# Patient Record
Sex: Male | Born: 1945 | Race: Black or African American | Hispanic: No | Marital: Married | Smoking: Former smoker
Health system: Southern US, Community
[De-identification: ages and names within clinical notes are randomized; demographics above are authoritative.]

## PROBLEM LIST (undated history)

## (undated) DIAGNOSIS — I639 Cerebral infarction, unspecified: Secondary | ICD-10-CM

## (undated) DIAGNOSIS — J309 Allergic rhinitis, unspecified: Secondary | ICD-10-CM

## (undated) DIAGNOSIS — I10 Essential (primary) hypertension: Secondary | ICD-10-CM

## (undated) DIAGNOSIS — I319 Disease of pericardium, unspecified: Secondary | ICD-10-CM

## (undated) DIAGNOSIS — E0590 Thyrotoxicosis, unspecified without thyrotoxic crisis or storm: Secondary | ICD-10-CM

## (undated) DIAGNOSIS — L80 Vitiligo: Secondary | ICD-10-CM

## (undated) DIAGNOSIS — G4733 Obstructive sleep apnea (adult) (pediatric): Secondary | ICD-10-CM

## (undated) DIAGNOSIS — M545 Low back pain, unspecified: Secondary | ICD-10-CM

## (undated) DIAGNOSIS — E7800 Pure hypercholesterolemia, unspecified: Secondary | ICD-10-CM

## (undated) DIAGNOSIS — R7302 Impaired glucose tolerance (oral): Secondary | ICD-10-CM

## (undated) DIAGNOSIS — D472 Monoclonal gammopathy: Secondary | ICD-10-CM

## (undated) DIAGNOSIS — M1990 Unspecified osteoarthritis, unspecified site: Secondary | ICD-10-CM

## (undated) DIAGNOSIS — Z9289 Personal history of other medical treatment: Secondary | ICD-10-CM

## (undated) DIAGNOSIS — Z9989 Dependence on other enabling machines and devices: Secondary | ICD-10-CM

## (undated) DIAGNOSIS — E669 Obesity, unspecified: Secondary | ICD-10-CM

## (undated) DIAGNOSIS — G8929 Other chronic pain: Secondary | ICD-10-CM

## (undated) HISTORY — PX: LUMBAR LAMINECTOMY: SHX95

## (undated) HISTORY — PX: UMBILICAL HERNIA REPAIR: SHX196

## (undated) HISTORY — DX: Allergic rhinitis, unspecified: J30.9

## (undated) HISTORY — DX: Disease of pericardium, unspecified: I31.9

## (undated) HISTORY — DX: Thyrotoxicosis, unspecified without thyrotoxic crisis or storm: E05.90

## (undated) HISTORY — DX: Essential (primary) hypertension: I10

## (undated) HISTORY — PX: INGUINAL HERNIA REPAIR: SUR1180

## (undated) HISTORY — DX: Vitiligo: L80

## (undated) HISTORY — DX: Personal history of other medical treatment: Z92.89

## (undated) HISTORY — DX: Obesity, unspecified: E66.9

---

## 1979-07-08 DIAGNOSIS — I319 Disease of pericardium, unspecified: Secondary | ICD-10-CM

## 1979-07-08 HISTORY — DX: Disease of pericardium, unspecified: I31.9

## 2010-01-25 ENCOUNTER — Ambulatory Visit: Payer: Self-pay | Admitting: Internal Medicine

## 2010-01-25 DIAGNOSIS — J309 Allergic rhinitis, unspecified: Secondary | ICD-10-CM

## 2010-01-25 DIAGNOSIS — I10 Essential (primary) hypertension: Secondary | ICD-10-CM

## 2010-01-25 DIAGNOSIS — L0292 Furuncle, unspecified: Secondary | ICD-10-CM | POA: Insufficient documentation

## 2010-01-25 DIAGNOSIS — L0293 Carbuncle, unspecified: Secondary | ICD-10-CM

## 2010-01-25 DIAGNOSIS — E0590 Thyrotoxicosis, unspecified without thyrotoxic crisis or storm: Secondary | ICD-10-CM

## 2010-01-25 HISTORY — DX: Thyrotoxicosis, unspecified without thyrotoxic crisis or storm: E05.90

## 2010-01-25 HISTORY — DX: Allergic rhinitis, unspecified: J30.9

## 2010-01-25 HISTORY — DX: Essential (primary) hypertension: I10

## 2010-03-03 ENCOUNTER — Ambulatory Visit: Payer: Self-pay | Admitting: Internal Medicine

## 2010-03-03 ENCOUNTER — Telehealth: Payer: Self-pay | Admitting: Internal Medicine

## 2010-03-03 DIAGNOSIS — M542 Cervicalgia: Secondary | ICD-10-CM | POA: Insufficient documentation

## 2010-03-07 ENCOUNTER — Telehealth: Payer: Self-pay | Admitting: Internal Medicine

## 2010-03-08 ENCOUNTER — Ambulatory Visit: Payer: Self-pay | Admitting: Internal Medicine

## 2010-03-08 DIAGNOSIS — K047 Periapical abscess without sinus: Secondary | ICD-10-CM | POA: Insufficient documentation

## 2010-04-25 ENCOUNTER — Telehealth: Payer: Self-pay | Admitting: Internal Medicine

## 2010-06-10 ENCOUNTER — Telehealth: Payer: Self-pay | Admitting: Internal Medicine

## 2010-12-06 NOTE — Progress Notes (Signed)
Summary: [facility]  03/08/2010  Phone Note Call from Patient   Caller: Patient Call For: Gordy Savers  MD Summary of Call: Pt left message to have Dr Kirtland Bouchard call him ASAP. Called back and left message on voice mail to call us back. [phone] Initial call taken by: Lynann Beaver CMA,  Mar 07, 2010 10:45 AM  Follow-up for Phone Call        In the office on Thursday.  Celebrex is helping with the pain, but he is taking 4 DAILY.  Cannot hear well from ear, but has no insurance.  Thinks it it an infected tooth.  Is going to try and see a dentist and call us back.  Follow-up by: Lynann Beaver CMA,  Mar 07, 2010 11:22 AM  Additional Follow-up for Phone Call Additional follow up Details #1::        ask patient to keep [place] posted and to take max dose of celebress of 2 daily; may take tylenol- dentist may Rx antibiotic and/or stronger pain med Additional Follow-up by: Gordy Savers  MD,  Mar 07, 2010 12:42 PM    Additional Follow-up for Phone Call Additional follow up Details #2::    LMTCB Follow-up by: Lynann Beaver CMA,  Mar 08, 2010 11:29 AM  Additional Follow-up for Phone Call Additional follow up Details #3:: Details for Additional Follow-up Action Taken: Pt. saw Dr. Kirtland Bouchard today. Additional Follow-up by: Lynann Beaver CMA,  Mar 08, 2010 11:34 AM

## 2010-12-06 NOTE — Assessment & Plan Note (Signed)
Summary: swollen/infected lymph gland??/dm   Vital Signs:  Patient profile:   65 year old male Weight:      259 pounds Temp:     97.6 degrees F oral BP sitting:   122 / 80  (right arm) Cuff size:   large  Vitals Entered By: Duard Brady LPN (March 03, 2010 2:48 PM) CC: rt neck gland swollen & painful   nasal congestion   CC:  rt neck gland swollen & painful   nasal congestion.  History of Present Illness: 65 year old patient who has a history of hypertension.  A possible one week ago.  He fell to the soft tissue infection involving the dorsal aspect of his right hand.  He felt it may have been bitten by an insect.  He took Cipro for one week, which he completed earlier today.  Three days ago he developed pain in the right anterior neck.  The hand lesion has largely resolved.  There is been no fever or other constitutional complaints.  Preventive Screening-Counseling & Management  Alcohol-Tobacco     Smoking Status: never  Allergies (verified): No Known Drug Allergies  Past History:  Past Medical History: Reviewed history from 01/25/2010 and no changes required. Hypertension Hyperthyroidism history of Graves' disease, status post I-131 1980s vitiligo obesity history of borderline glaucoma rule out obstructive sleep apnea pericarditis 1980s Allergic rhinitis  Past Surgical History: Reviewed history from 01/25/2010 and no changes required. Inguinal herniorrhaphy Lumbar laminectomy status post umbilical hernia repair colonoscopy 2009  Social History: Smoking Status:  never  Physical Exam  General:  overweight-appearing.  normal blood pressure Head:  Normocephalic and atraumatic without obvious abnormalities. No apparent alopecia or balding. Eyes:  No corneal or conjunctival inflammation noted. EOMI. Perrla. Funduscopic exam benign, without hemorrhages, exudates or papilledema. Vision grossly normal. Ears:  External ear exam shows no significant lesions or  deformities.  Otoscopic examination reveals clear canals, tympanic membranes are intact bilaterally without bulging, retraction, inflammation or discharge. Hearing is grossly normal bilaterally. Mouth:  Oral mucosa and oropharynx without lesions or exudates.  Teeth in good repair. Neck:  there is tenderness to deep palpation in the right anterior cervical area.  There is no definite adenopathy Lungs:  Normal respiratory effort, chest expands symmetrically. Lungs are clear to auscultation, no crackles or wheezes. Heart:  Normal rate and regular rhythm. S1 and S2 normal without gallop, murmur, click, rub or other extra sounds. Cervical Nodes:  No lymphadenopathy noted Axillary Nodes:  No palpable lymphadenopathy no right epitrochlear nodes   Impression & Recommendations:  Problem # 1:  NECK PAIN, RIGHT (ICD-723.1) etiology is unclear.  Does not appear to have obvious lymphadenitis.  ENT exam is unremarkable.  He has just  completed 7 days of Cipro therapy.  Will place on Celebrex 400 mg daily, and clinically observe  Problem # 2:  HYPERTENSION (ICD-401.9)  His updated medication list for this problem includes:    Metoprolol Succinate 50 Mg Xr24h-tab (Metoprolol succinate) ..... One daily    Lisinopril 20 Mg Tabs (Lisinopril) ..... One daily  Complete Medication List: 1)  Metoprolol Succinate 50 Mg Xr24h-tab (Metoprolol succinate) .... One daily 2)  Lisinopril 20 Mg Tabs (Lisinopril) .... One daily 3)  Cephalexin 500 Mg Caps (Cephalexin) .... One capsule 3 times daily 4)  Cialis 20 Mg Tabs (Tadalafil) .... As directed  Patient Instructions: 1)  Celebrex 200 mg twice daily 2)  return next week for follow-up if pain is persistent 3)  call for any new symptoms

## 2010-12-06 NOTE — Assessment & Plan Note (Signed)
Summary: TO BE EST/LESIONS ON BUTTOCK/NJR   Vital Signs:  Patient profile:   65 year old male Height:      71.75 inches Weight:      255 pounds BMI:     34.95 Temp:     98.0 degrees F oral BP sitting:   138 / 90  (right arm) Cuff size:   large CC: new pt to establish - no insurance right now - has relocated from [place] , not working just yet Is Patient Diabetic? No   CC:  new pt to establish - no insurance right now - has relocated from [place]  and not working just yet.  History of Present Illness: 5 -year-old patient who is seen today  to establish what our practicegood and he has an approximate 10 year history of treated hypertension.  His main complaint today are skin abscess is involving the buttock region.  Medical problems include the hypertension, history of Graves' disease, history of pericarditis in the 89s and also a history of seasonal allergic rhinitis. He is a heavy snorer and his wife has concerns about possible obstructive sleep apnea.  He really denies any daytime sleepiness.  Preventive Screening-Counseling & Management  Alcohol-Tobacco     Smoking Status: quit  Allergies (verified): No Known Drug Allergies  Past History:  Past Medical History: Hypertension Hyperthyroidism history of Graves' disease, status post I-131 1980s vitiligo obesity history of borderline glaucoma rule out obstructive sleep apnea pericarditis 1980s Allergic rhinitis  Past Surgical History: Inguinal herniorrhaphy Lumbar laminectomy status post umbilical hernia repair colonoscopy 2009  Family History: Reviewed history and no changes required. father died at 24.  complications of cerebral vascular  disease mother age 1, lives independently two brothers, one died complications.  No cancer one sister, history of thyroid disease, and goiter  Social History: Reviewed history and no changes required. Married Airline pilot presently  unemployed two daughters, one with a history of breast  cancerSmoking Status:  quit  Review of Systems       The patient complains of suspicious skin lesions.  The patient denies anorexia, fever, weight loss, weight gain, vision loss, decreased hearing, hoarseness, chest pain, syncope, dyspnea on exertion, peripheral edema, prolonged cough, headaches, hemoptysis, abdominal pain, melena, hematochezia, severe indigestion/heartburn, hematuria, incontinence, genital sores, muscle weakness, transient blindness, difficulty walking, depression, unusual weight change, abnormal bleeding, enlarged lymph nodes, angioedema, breast masses, and testicular masses.    Physical Exam  General:  overweight-appearing.  vitiligo;  blood pressure 140/90 Head:  Normocephalic and atraumatic without obvious abnormalities. No apparent alopecia or balding. Eyes:  No corneal or conjunctival inflammation noted. EOMI. Perrla. Funduscopic exam benign, without hemorrhages, exudates or papilledema. Vision grossly normal. Ears:  External ear exam shows no significant lesions or deformities.  Otoscopic examination reveals clear canals, tympanic membranes are intact bilaterally without bulging, retraction, inflammation or discharge. Hearing is grossly normal bilaterally. Nose:  External nasal examination shows no deformity or inflammation. Nasal mucosa are pink and moist without lesions or exudates. Mouth:  Oral mucosa and oropharynx without lesions or exudates.  Teeth in good repair.pharyngeal crowding.   Neck:  No deformities, masses, or tenderness noted. Chest Wall:  No deformities, masses, tenderness or gynecomastia noted. Breasts:  No masses or gynecomastia noted Lungs:  Normal respiratory effort, chest expands symmetrically. Lungs are clear to auscultation, no crackles or wheezes. Heart:  Normal rate and regular rhythm. S1 and S2 normal without gallop, murmur, click, rub or other extra sounds. Abdomen:  Bowel sounds positive,abdomen soft  and non-tender without masses, organomegaly  or hernias noted. Rectal:  No external abnormalities noted. Normal sphincter tone. No rectal masses or tenderness. Genitalia:  Testes bilaterally descended without nodularity, tenderness or masses. No scrotal masses or lesions. No penis lesions or urethral discharge. Prostate:  1+ enlarged.   Msk:  No deformity or scoliosis noted of thoracic or lumbar spine.   Pulses:  R and L carotid,radial,femoral,dorsalis pedis and posterior tibial pulses are full and equal bilaterally Extremities:  No clubbing, cyanosis, edema, or deformity noted with normal full range of motion of all joints.   Neurologic:  No cranial nerve deficits noted. Station and gait are normal. Plantar reflexes are down-going bilaterally. DTRs are symmetrical throughout. Sensory, motor and coordinative functions appear intact. Skin:  scattered small inflammatory papules over the buttock area Cervical Nodes:  No lymphadenopathy noted Axillary Nodes:  No palpable lymphadenopathy Inguinal Nodes:  No significant adenopathy Psych:  Cognition and judgment appear intact. Alert and cooperative with normal attention span and concentration. 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 One daily    Lisinopril 20 Mg Tabs (Lisinopril) ..... One daily  Problem # 3:  ALLERGIC RHINITIS (ICD-477.9)  Complete Medication List: 1)  Metoprolol Succinate 50 Mg Xr24h-tab (Metoprolol succinate) .... One daily 2)  Lisinopril 20 Mg Tabs (Lisinopril) .... One daily 3)  Cephalexin 500 Mg Caps (Cephalexin) .... One capsule 3 times daily 4)  Cialis 20 Mg Tabs (Tadalafil) .... As directed  Other Orders: EKG w/ Interpretation (93000)  Patient Instructions: 1)  Please schedule a follow-up appointment in 6  months. 2)  Limit your Sodium (Salt). 3)  It is important that you exercise regularly at least 20 minutes 5 times a week. If you develop chest pain, have severe difficulty breathing, or feel very tired , stop exercising immediately and seek medical attention. 4)  You need to lose weight. Consider a lower calorie diet and regular exercise.  5)  Check your Blood Pressure regularly. If it is above:  150/90 you should make an appointment. 6)  Take your antibiotic as prescribed until ALL of it is gone, but stop if you develop a rash or swelling and contact our office as soon as possible. Prescriptions: CIALIS 20 MG TABS (TADALAFIL) as directed  #6 x 12   Entered and Authorized by:   Gordy Savers  MD   Signed by:   Gordy Savers  MD on 01/25/2010   Method used:   Print then Give to Patient   RxID:   [id number] CEPHALEXIN 500 MG CAPS (CEPHALEXIN) one capsule 3 times daily  #30 x 0   Entered and Authorized by:   Gordy Savers  MD   Signed by:   Gordy Savers  MD on 01/25/2010   Method used:   Print then Give to Patient   RxID:   [id number] LISINOPRIL 20 MG TABS (LISINOPRIL) one daily  #90 x 4   Entered and Authorized by:   Gordy Savers  MD   Signed by:   Gordy Savers  MD on 01/25/2010   Method used:   Print then Give to Patient   RxID:   [id number] METOPROLOL SUCCINATE 50 MG XR24H-TAB (METOPROLOL SUCCINATE) one daily  #90 x 0   Entered and Authorized by:  Gordy Savers  MD   Signed by:   Gordy Savers  MD on 01/25/2010   Method used:   Print then Give to Patient   RxID:   [id number]

## 2010-12-06 NOTE — Progress Notes (Signed)
Summary: sinus sx - muccinex d   Phone Note Call from Patient   Caller: spouse- Dianne Summary of Call: Pt has sinus and upper respiratory inf. Pt req med called in to [facility]. [phone]. Pt says that they cant afford copay for an ov.  Initial call taken by: Lucy Antigua,  March 03, 2010 9:15 AM  Follow-up for Phone Call        suggest mucinex D  use BID Follow-up by: Gordy Savers  MD,  March 03, 2010 12:35 PM  Additional Follow-up for Phone Call Additional follow up Details #1::        spoke with wife - instructed to use muccinex d OTC two times a day - and make sure to hydrate well or med will not be helpful.  If no improvment 2-3 days or fever - to see.  KIK Additional Follow-up by: Duard Brady LPN,  March 03, 2010 12:59 PM     Appended Document: sinus sx - muccinex d  Pt calls back stating that he has developed an extremely painful swollen cervical gland???  Concerned about infection, and if he needs more than Mucinex.  Will come in to let Dr. Kirtland Bouchard evaluate.

## 2010-12-06 NOTE — Progress Notes (Signed)
Summary: refill lisnopril and metoprol.  Phone Note Refill Request Message from:  Fax from Pharmacy on June 10, 2010 11:45 AM  Refills Requested: Medication #1:  METOPROLOL SUCCINATE 50 MG XR24H-TAB one daily  Medication #2:  LISINOPRIL 20 MG TABS one daily [facility]   Method Requested: Fax to Local Pharmacy Initial call taken by: Duard Brady LPN,  June 10, 2010 11:46 AM    Prescriptions: LISINOPRIL 20 MG TABS (LISINOPRIL) one daily  #90 x 4   Entered by:   Duard Brady LPN   Authorized by:   Gordy Savers  MD   Signed by:   Duard Brady LPN on 91/47/8295   Method used:   Faxed to ...       [facility] (mail-order)             , [place]         Ph: [phone]       Fax: [phone]   RxID:   [id number] METOPROLOL SUCCINATE 50 MG XR24H-TAB (METOPROLOL SUCCINATE) one daily  #90 x 4   Entered by:   Duard Brady LPN   Authorized by:   Gordy Savers  MD   Signed by:   Duard Brady LPN on 40/34/7425   Method used:   Faxed to ...       [facility] (mail-order)             , [place]         Ph: [phone]       Fax: [phone]   RxID:   [id number]

## 2010-12-06 NOTE — Assessment & Plan Note (Signed)
Summary: ear pain//ccm   Vital Signs:  Patient profile:   65 year old male Weight:      261 pounds Temp:     98.1 degrees F oral BP sitting:   130 / 80  (right arm) Cuff size:   large  Vitals Entered By: Duard Brady LPN (Mar 08, 5620 8:09 AM) CC: c/o (R) ear pain, cough Is Patient Diabetic? No   CC:  c/o (R) ear pain and cough.  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  He also has a cracked tooth and dental pain in the left upper region.  He has treated hypertension.  Denies any fever or chills.  He states his pain is much improved compared to yesterday.  He is on Celebrex.  Denies any drug allergies.  Allergies (verified): No Known Drug Allergies  Past History:  Past Medical History: Reviewed history from 01/25/2010 and no changes required. Hypertension Hyperthyroidism history of Graves' disease, status post I-131 1980s vitiligo obesity history of borderline glaucoma rule out obstructive sleep apnea pericarditis 1980s Allergic rhinitis  Physical Exam  General:  overweight-appearing.  130/80overweight-appearing.   Head:  Normocephalic and atraumatic without obvious abnormalities. No apparent alopecia or balding. Eyes:  No corneal or conjunctival inflammation noted. EOMI. Perrla. Funduscopic exam benign, without hemorrhages, exudates or papilledema. Vision grossly normal. Ears:  External ear exam shows no significant lesions or deformities.  Otoscopic examination reveals clear canals, tympanic membranes are intact bilaterally without bulging, retraction, inflammation or discharge. Hearing is grossly normal bilaterally. Mouth:  Oral mucosa and oropharynx without lesions or exudates.  a cracked tooth was present involving the right upper region Neck:  there was tenderness and mild soft tissue swelling about the right facial and mandibular area.  Mild tender right anterior cervical  adenopathy noted Lungs:  Normal respiratory effort, chest expands symmetrically. Lungs are clear to auscultation, no crackles or wheezes. Heart:  Normal rate and regular rhythm. S1 and S2 normal without gallop, murmur, click, rub or other extra sounds.   Impression & Recommendations:  Problem # 1:  ABSCESS, TOOTH (ICD-522.5)  Problem # 2:  NECK PAIN, RIGHT (ICD-723.1)  Problem # 3:  HYPERTENSION (ICD-401.9)  His updated medication list for this problem includes:    Metoprolol Succinate 50 Mg Xr24h-tab (Metoprolol succinate) ..... One daily    Lisinopril 20 Mg Tabs (Lisinopril) ..... One daily  His updated medication list for this problem includes:    Metoprolol Succinate 50 Mg Xr24h-tab (Metoprolol succinate) ..... One daily    Lisinopril 20 Mg Tabs (Lisinopril) ..... One daily  Complete Medication List: 1)  Metoprolol Succinate 50 Mg Xr24h-tab (Metoprolol succinate) .... One daily 2)  Lisinopril 20 Mg Tabs (Lisinopril) .... One daily 3)  Cialis 20 Mg Tabs (Tadalafil) .... As directed 4)  Amoxicillin-pot Clavulanate 875-125 Mg Tabs (Amoxicillin-pot clavulanate) .... One twice daily  Patient Instructions: 1)  follow up with your dentist 2)  Limit your Sodium (Salt) to less than 2 grams a day(slightly less than 1/2 a teaspoon) to prevent fluid retention, swelling, or worsening of symptoms. 3)  Take your antibiotic as prescribed until ALL of it is gone, but stop if you develop a rash or swelling and contact our office as soon as possible. 4)  Please schedule a follow-up appointment in 6 months. Prescriptions: AMOXICILLIN-POT CLAVULANATE 875-125 MG TABS (AMOXICILLIN-POT CLAVULANATE) one twice daily  #20 x 0   Entered and Authorized by:  Gordy Savers  MD   Signed by:   Gordy Savers  MD on 03/08/2010   Method used:   Print then Give to Patient   RxID:   [id number]

## 2010-12-06 NOTE — Progress Notes (Signed)
Summary: EKG?  Phone Note Call from Patient Call back at Home Phone [phone]   Caller: Spouse Summary of Call: Pt states he was advised by physician he would not be charged for ekg done on 01/25/10.  Please advise.  Initial call taken by: Trixie Dredge,  March 03, 2010 8:45 AM  Follow-up for Phone Call        ok to cancel charge Follow-up by: Gordy Savers  MD,  March 03, 2010 8:52 AM

## 2010-12-06 NOTE — Progress Notes (Signed)
Summary: Pt req script for Metoprolol and  Lisinopril to [facility]  Phone Note Call from Patient Call back at Home Phone [phone]   Caller: spouse- Dianne Summary of Call: Pt needs refills of Metoprolol 50mg  and Lisinopril 20mg  to [facility].  Initial call taken by: Lucy Antigua,  April 25, 2010 4:00 PM    Prescriptions: LISINOPRIL 20 MG TABS (LISINOPRIL) one daily  #90 x 0   Entered by:   Kern Reap CMA (AAMA)   Authorized by:   Gordy Savers  MD   Signed by:   Kern Reap CMA (AAMA) on 04/25/2010   Method used:   Faxed to ...       [facility] (mail-order)             , [place]         Ph: [phone]       Fax: [phone]   RxID:   [id number] METOPROLOL SUCCINATE 50 MG XR24H-TAB (METOPROLOL SUCCINATE) one daily  #90 x 0   Entered by:   Kern Reap CMA (AAMA)   Authorized by:   Gordy Savers  MD   Signed by:   Kern Reap CMA (AAMA) on 04/25/2010   Method used:   Faxed to ...       [facility] (mail-[facility])             , [place]         Ph: [phone]       Fax: [phone]   RxID:   [id number]

## 2010-12-08 ENCOUNTER — Encounter: Payer: Self-pay | Admitting: Internal Medicine

## 2010-12-09 ENCOUNTER — Ambulatory Visit: Payer: Self-pay | Admitting: Internal Medicine

## 2010-12-09 ENCOUNTER — Encounter: Payer: Self-pay | Admitting: Internal Medicine

## 2010-12-09 ENCOUNTER — Ambulatory Visit (INDEPENDENT_AMBULATORY_CARE_PROVIDER_SITE_OTHER): Payer: PRIVATE HEALTH INSURANCE | Admitting: Internal Medicine

## 2010-12-09 VITALS — BP 118/80 | Temp 98.2°F | Ht 72.0 in | Wt 255.0 lb

## 2010-12-09 DIAGNOSIS — N529 Male erectile dysfunction, unspecified: Secondary | ICD-10-CM

## 2010-12-09 DIAGNOSIS — J069 Acute upper respiratory infection, unspecified: Secondary | ICD-10-CM

## 2010-12-09 DIAGNOSIS — I10 Essential (primary) hypertension: Secondary | ICD-10-CM

## 2010-12-09 MED ORDER — HYDROCODONE-HOMATROPINE 5-1.5 MG/5ML PO SYRP: 5.0000 mL | ORAL_SOLUTION | Freq: Four times a day (QID) | ORAL | Status: AC | PRN

## 2010-12-09 NOTE — Progress Notes (Signed)
  Subjective:    Patient ID: Gordon Phillips, male    DOB: March 06, 1946, 65 y.o.   MRN: [id number]  HPI  65 year old patient who has a history of hypertension, as well as allergic rhinitis, who presents with a one to two week history of cough and congestion.  He has also had some intermittent mild sore throat and also some tenderness in the left anterior neck area.  The cough has improved.  His main complaint now is pain in the left lower neck region.  He has only mild sore throat.  He has been using penicillin obtained from his wife periodically over the past week.  He does have a history of OSA using CPAP.    Review of Systems  Constitutional: Negative for fever, chills, appetite change and fatigue.  HENT: Positive for neck pain. Negative for hearing loss, ear pain, congestion, sore throat, trouble swallowing, neck stiffness, dental problem, voice change and tinnitus.   Eyes: Negative for pain, discharge and visual disturbance.  Respiratory: Positive for cough. Negative for chest tightness, wheezing and stridor.   Cardiovascular: Negative for chest pain, palpitations and leg swelling.  Gastrointestinal: Negative for nausea, vomiting, abdominal pain, diarrhea, constipation, blood in stool and abdominal distention.  Genitourinary: Negative for urgency, hematuria, flank pain, discharge, difficulty urinating and genital sores.  Musculoskeletal: Negative for myalgias, back pain, joint swelling, arthralgias and gait problem.  Skin: Negative for rash.  Neurological: Negative for dizziness, syncope, speech difficulty, weakness, numbness and headaches.  Hematological: Negative for adenopathy. Does not bruise/bleed easily.  Psychiatric/Behavioral: Negative for behavioral problems and dysphoric mood. The patient is not nervous/anxious.        Objective:   Physical Exam  Constitutional: He is oriented to person, place, and time. He appears well-developed and well-nourished.       Overweight no distress    HENT:  Head: Normocephalic.  Right Ear: External ear normal.  Left Ear: External ear normal.  Eyes: Conjunctivae and EOM are normal. Pupils are equal, round, and reactive to light.  Neck: Normal range of motion. Neck supple.       No definite adenopathy appreciated.  There was some tenderness involving the left lower anterior neck region just anterior to the lower border of the sternocleidomastoid muscle; careful palpation in the neck and supraclavicular area revealed no adenopathy  Cardiovascular: Normal rate, normal heart sounds and intact distal pulses.   Pulmonary/Chest: Breath sounds normal. No respiratory distress. He has no wheezes. He has no rales. He exhibits no tenderness.  Abdominal: Bowel sounds are normal. There is no tenderness.  Musculoskeletal: Normal range of motion. He exhibits no edema and no tenderness.  Lymphadenopathy:    He has no cervical adenopathy.  Neurological: He is alert and oriented to person, place, and time.  Psychiatric: He has a normal mood and affect. His behavior is normal.          Assessment & Plan:  1.  URI will treat symptomatically and re-examine if the left neck discomfort does not improve. 2.  He has concerns about low testosterone level.  Will recheck next week In the early a.m. 3.  Hypertension stable

## 2010-12-09 NOTE — Patient Instructions (Signed)
Limit your sodium (Salt) intake   It is important that you exercise regularly, at least 20 minutes 3 to 4 times per week.  If you develop chest pain or shortness of breath seek  medical attention.  Please check your blood pressure on a regular basis.  If it is consistently greater than 150/90, please make an office appointment.  Returned on Monday morning for the testosterone test  Get plenty of rest, Drink lots of  clear liquids, and use Tylenol or ibuprofen for fever and discomfort.

## 2010-12-12 ENCOUNTER — Other Ambulatory Visit (INDEPENDENT_AMBULATORY_CARE_PROVIDER_SITE_OTHER): Payer: PRIVATE HEALTH INSURANCE | Admitting: Internal Medicine

## 2010-12-12 DIAGNOSIS — E291 Testicular hypofunction: Secondary | ICD-10-CM

## 2010-12-12 DIAGNOSIS — N529 Male erectile dysfunction, unspecified: Secondary | ICD-10-CM

## 2010-12-12 DIAGNOSIS — E349 Endocrine disorder, unspecified: Secondary | ICD-10-CM

## 2010-12-12 LAB — TESTOSTERONE: Testosterone: 336.92 ng/dL — ABNORMAL LOW (ref 350.00–890.00)

## 2010-12-13 ENCOUNTER — Telehealth: Payer: Self-pay

## 2010-12-13 NOTE — Telephone Encounter (Signed)
Attempt to call- ans mach - LMTCB - informed low normal - would he like rx for testosterone?

## 2010-12-13 NOTE — Telephone Encounter (Signed)
Message copied by Duard Brady on Tue Dec 13, 2010  5:01 PM ------      Message from: Eleonore Chiquito      Created: Mon Dec 12, 2010  5:37 PM       Notify  patient that his level was borderline low and ask whether he would like a prescription for testosterone called in

## 2010-12-14 ENCOUNTER — Telehealth: Payer: Self-pay | Admitting: Internal Medicine

## 2010-12-14 NOTE — Telephone Encounter (Signed)
Pt request call from Dr. Amador Cunas if possible - has question r/t possible starting testosterone med since results was boredline low. Concerned about side effects and is this a med that he could stop in the future.

## 2010-12-14 NOTE — Telephone Encounter (Signed)
Pt is return kim call concerning bloodwork results

## 2010-12-15 NOTE — Telephone Encounter (Signed)
Called and left  Message  on his home after machine.  Unable to reach on his mobile.  Number

## 2010-12-16 NOTE — Telephone Encounter (Signed)
Doctor in separate encounter - attempted to call. KIK

## 2011-05-29 ENCOUNTER — Encounter: Payer: Self-pay | Admitting: Internal Medicine

## 2011-05-29 ENCOUNTER — Ambulatory Visit (INDEPENDENT_AMBULATORY_CARE_PROVIDER_SITE_OTHER): Payer: PRIVATE HEALTH INSURANCE | Admitting: Internal Medicine

## 2011-05-29 VITALS — BP 130/80 | Temp 98.1°F | Wt 262.0 lb

## 2011-05-29 DIAGNOSIS — R7309 Other abnormal glucose: Secondary | ICD-10-CM

## 2011-05-29 DIAGNOSIS — R7302 Impaired glucose tolerance (oral): Secondary | ICD-10-CM

## 2011-05-29 DIAGNOSIS — I10 Essential (primary) hypertension: Secondary | ICD-10-CM

## 2011-05-29 LAB — GLUCOSE, POCT (MANUAL RESULT ENTRY): POC Glucose: 107

## 2011-05-29 MED ORDER — SILDENAFIL CITRATE 100 MG PO TABS: 100.0000 mg | ORAL_TABLET | ORAL | Status: DC | PRN

## 2011-05-29 MED ORDER — METOPROLOL SUCCINATE ER 50 MG PO TB24: 50.0000 mg | ORAL_TABLET | Freq: Every day | ORAL | Status: DC

## 2011-05-29 MED ORDER — LISINOPRIL 20 MG PO TABS: 20.0000 mg | ORAL_TABLET | Freq: Every day | ORAL | Status: DC

## 2011-05-29 NOTE — Patient Instructions (Signed)
Limit your sodium (Salt) intake    It is important that you exercise regularly, at least 20 minutes 3 to 4 times per week.  If you develop chest pain or shortness of breath seek  medical attention.  You need to lose weight.  Consider a lower calorie diet and regular exercise.  Return in 6 months for follow-up   

## 2011-05-29 NOTE — Progress Notes (Signed)
  Subjective:    Patient ID: Gordon Phillips, male    DOB: 1946-09-11, 65 y.o.   MRN: [id number]  HPI 65 year old patient who is seen today for followup of his hypertension. He had some itching involving the upper back earlier that has resolved. He remains on Toprol and Zestril for blood pressure control. She requires nocturnal CPAP for obstructive sleep apnea. His only complaint is decreased urinary flow most marked in the morning   Review of Systems  Constitutional: Negative for fever, chills, appetite change and fatigue.  HENT: Negative for hearing loss, ear pain, congestion, sore throat, trouble swallowing, neck stiffness, dental problem, voice change and tinnitus.   Eyes: Negative for pain, discharge and visual disturbance.  Respiratory: Negative for cough, chest tightness, wheezing and stridor.   Cardiovascular: Negative for chest pain, palpitations and leg swelling.  Gastrointestinal: Negative for nausea, vomiting, abdominal pain, diarrhea, constipation, blood in stool and abdominal distention.  Genitourinary: Positive for urgency and difficulty urinating. Negative for hematuria, flank pain, discharge and genital sores.  Musculoskeletal: Negative for myalgias, back pain, joint swelling, arthralgias and gait problem.  Skin: Positive for rash.       Followed by dermatology for vitiligo which has been improved with topical medication  Neurological: Negative for dizziness, syncope, speech difficulty, weakness, numbness and headaches.  Hematological: Negative for adenopathy. Does not bruise/bleed easily.  Psychiatric/Behavioral: Negative for behavioral problems and dysphoric mood. The patient is not nervous/anxious.        Objective:   Physical Exam  Constitutional: He is oriented to person, place, and time. He appears well-developed.  HENT:  Head: Normocephalic.  Right Ear: External ear normal.  Left Ear: External ear normal.  Eyes: Conjunctivae and EOM are normal.  Neck: Normal range  of motion.  Cardiovascular: Normal rate and normal heart sounds.   Pulmonary/Chest: Breath sounds normal.  Abdominal: Bowel sounds are normal.  Musculoskeletal: Normal range of motion. He exhibits no edema and no tenderness.  Neurological: He is alert and oriented to person, place, and time.  Skin:       Marked scalp vitiligo. Also patchy areas over the arms  Psychiatric: He has a normal mood and affect. His behavior is normal.          Assessment & Plan:   Hypertension well controlled  We'll continue his present regimen. Refills provided. We'll schedule for an annual exam in 6 months

## 2011-07-11 ENCOUNTER — Other Ambulatory Visit: Payer: Self-pay | Admitting: Internal Medicine

## 2011-07-11 MED ORDER — METOPROLOL SUCCINATE ER 50 MG PO TB24: 50.0000 mg | ORAL_TABLET | Freq: Every day | ORAL | Status: DC

## 2011-07-11 MED ORDER — LISINOPRIL 20 MG PO TABS: 20.0000 mg | ORAL_TABLET | Freq: Every day | ORAL | Status: DC

## 2011-07-11 NOTE — Telephone Encounter (Signed)
efiled to [employer]

## 2011-07-11 NOTE — Telephone Encounter (Signed)
Pt req refill of lisinopril (PRINIVIL,ZESTRIL) 20 MG tablet and metoprolol (TOPROL-XL) 50 MG 24 hr tablet to [facility] order pharmacy.

## 2011-08-10 ENCOUNTER — Ambulatory Visit: Payer: PRIVATE HEALTH INSURANCE | Admitting: Internal Medicine

## 2011-08-10 ENCOUNTER — Encounter: Payer: Self-pay | Admitting: Internal Medicine

## 2011-08-10 ENCOUNTER — Ambulatory Visit (INDEPENDENT_AMBULATORY_CARE_PROVIDER_SITE_OTHER): Payer: PRIVATE HEALTH INSURANCE | Admitting: Internal Medicine

## 2011-08-10 VITALS — BP 140/90 | Temp 98.0°F | Wt 259.0 lb

## 2011-08-10 DIAGNOSIS — I10 Essential (primary) hypertension: Secondary | ICD-10-CM

## 2011-08-10 DIAGNOSIS — N139 Obstructive and reflux uropathy, unspecified: Secondary | ICD-10-CM

## 2011-08-10 DIAGNOSIS — Z23 Encounter for immunization: Secondary | ICD-10-CM

## 2011-08-10 DIAGNOSIS — R05 Cough: Secondary | ICD-10-CM

## 2011-08-10 DIAGNOSIS — R059 Cough, unspecified: Secondary | ICD-10-CM

## 2011-08-10 DIAGNOSIS — N138 Other obstructive and reflux uropathy: Secondary | ICD-10-CM

## 2011-08-10 DIAGNOSIS — Z0000 Encounter for general adult medical examination without abnormal findings: Secondary | ICD-10-CM

## 2011-08-10 DIAGNOSIS — N401 Enlarged prostate with lower urinary tract symptoms: Secondary | ICD-10-CM

## 2011-08-10 MED ORDER — HYDROCODONE-HOMATROPINE 5-1.5 MG/5ML PO SYRP: 5.0000 mL | ORAL_SOLUTION | Freq: Four times a day (QID) | ORAL | Status: AC | PRN

## 2011-08-10 MED ORDER — TAMSULOSIN HCL 0.4 MG PO CAPS: 0.4000 mg | ORAL_CAPSULE | ORAL | Status: DC

## 2011-08-10 NOTE — Patient Instructions (Signed)
Limit your sodium (Salt) intake  Avoids foods high in acid such as tomatoes citrus juices, and spicy foods.  Avoid eating within two hours of lying down or before exercising.  Do not overheat.  Try smaller more frequent meals.  If symptoms persist, elevate the head of her bed 12 inches while sleeping.  Please check your blood pressure on a regular basis.  If it is consistently greater than 150/90, please make an office appointment.  Return in 6 months for follow-up  Benign Prostatic Hypertrophy   (Benign Prostatic Hyperplasia, Enlarged Prostate) The prostate gland is part of the reproductive system of men. A normal prostate is about the size and shape of a walnut. The prostate gland makes a fluid that is mixed with sperm to make semen. This gland surrounds the urethra and is located in front of the rectum and just below the bladder. The bladder is where urine is stored. The urethra is the tube through which urine passes from the bladder to get out of the body. The prostate grows as a man ages. An enlarged prostate not caused by cancer is called Benign Prostatic Hypertrophy (BPH). This is a common health problem in men over age 4. This condition is a normal part of aging. An enlarged prostate presses on the urethra. This makes it harder to pass urine. In the early stages of enlargement, the bladder can get by with a narrowed urethra by forcing the urine through. If the problem gets worse, medical or surgical treatment may be required.   This condition should be followed by your caregiver. Longstanding back pressure on the kidneys can cause infection. Back pressure and infection can progress to bladder damage and kidney (renal) failure. If needed, your caregiver may refer you to a specialist in kidney and prostate disease (urologist). CAUSES The exact cause is not known.   SYMPTOMS  You are not able to completely empty your bladder.   Getting up often during the night to urinate.   Need to  urinate frequently during the day.   Difficultly in starting urine flow.   Decrease in size and strength of the urine stream.   Dribbling after urination.   Pain on urination (more common with infection).   Inability to pass your water. This needs immediate treatment.  DIAGNOSIS These tests will help your caregiver understand your problem:  Digital rectal exam (DRE) - In a rectal exam, your caregiver checks your prostate by putting a gloved, lubricated finger into the rectum to feel the back of your prostate gland. This exam detects the size of the gland and abnormal lumps or growths.   Urinalysis - Exam of the urine. This may include a culture if there is concern about infection.   Prostate Specific Antigen (PSA) - This is a helpful blood test used to screen for prostate cancer. It is not used alone for diagnosing prostate cancer.   Rectal ultrasound (sonogram) - This test uses sound waves to electronically produce a "picture" of the prostate. It helps examine the prostate gland for cancer.  TREATMENT Mild symptoms may not need treatment. Simple observation and yearly exams may be all that is required. Medications and surgery are options for more severe problems. Your caregiver can help you make an informed decision for what is best. Two classes of medications are available for relief of prostate symptoms:  There are medications which can be used to shrink the prostate. This helps relieve symptoms.   Uncommon side effects include problems with sexual function.  There are also medications to relax the muscle of the prostate. This also relieves the obstruction.   Side effects can include dizziness, fatigue, lightheadedness, and retrograde ejaculation (diminished volume of ejaculate).  Several types of surgical treatments are available for relief of prostate symptoms:  Transurethral resection of the prostate (TURP). In this treatment, an instrument is inserted through opening at the  tip of the penis. It is used to cut away pieces of the inner core of the prostate. The pieces are removed through the same opening of the penis. This removes the obstruction and helps get rid of the symptoms.   Transurethral incision (TUIP). In this procedure, small cuts are made in the prostate. This lessens the prostates pressure on the urethra.   Transurethral microwave thermotherapy (TUMT). This procedure uses microwaves to create heat. The heat destroys and removes a small amount of prostate tissue.   Transurethral needle ablation (TUNA). This is a procedure that uses radio frequencies to do the same as TUMT.   Interstitial Laser Coagulation (ILC). This is a procedure that uses a laser to do the same as TUMT and TUNA.   Transurethral electrovaporization (TUVP). This is a procedure that uses electrodes to do the same as the procedures listed above.  Regardless of the method of treatment chosen, you and your caregiver will discuss the options. With this knowledge, you along with your caregiver can decide upon the best treatment for you. SEEK MEDICAL CARE IF:  You develop chills, fever of 100.5 F (38.1 C), or night sweats.   There is unexplained back pain.   Symptoms are not helped by medications prescribed.   You develop medication side effects.   Your urine becomes very dark or has a bad smell.  SEEK IMMEDIATE MEDICAL CARE IF:  You are suddenly unable to urinate. This is an emergency. You should be seen immediately.   There are large amounts of blood or clots in the urine.   Your urinary problems become unmanageable.   You develop lightheadedness, severe dizziness, or you feel faint.   You develop moderate to severe low back or flank pain.   You develop chills or fever.  Document Released: 10/23/2005 Document Re-Released: 08/20/2007 [facility] Patient Information 2011 [place], [place].Acid Reflux (GERD) Acid reflux is also called gastroesophageal reflux disease (GERD). Your  stomach makes acid to help digest food. Acid reflux happens when acid from your stomach goes into the tube between your mouth and stomach (esophagus). Your stomach is protected from the acid, but this tube is not. When acid gets into the tube, it may cause a burning feeling in the chest (heartburn). Besides heartburn, other health problems can happen if the acid keeps going into the tube. Some causes of acid reflux include:  Being overweight.   Smoking.   Drinking alcohol.   Eating large meals.   Eating meals and then going to bed right away.   Eating certain foods.   Increased stomach acid production.  HOME CARE  Take all medicine as told by your doctor.   You may need to:   Lose weight.   Avoid alcohol.   Quit smoking.   Do not eat big meals. It is better to eat smaller meals throughout the day.   Do not eat a meal and then nap or go to bed.   Sleep with your head higher than your stomach.   Avoid foods that bother you.   You may need more tests, or you may need to see a special  doctor.  GET HELP RIGHT AWAY IF:  You have chest pain that is different than before.   You have pain that goes to your arms, jaw, or between your shoulder blades.   You throw up (vomit) blood, dark brown liquid, or your throw up looks like coffee grounds.   You have trouble swallowing.   You have trouble breathing or cannot stop coughing.   You feel dizzy or pass out.   Your skin is cool, wet, and pale.   Your medicine is not helping.  MAKE SURE YOU:    Understand these instructions.   Will watch your condition.   Will get help right away if you are not doing well or get worse.  Document Released: 04/10/2008 Document Re-Released: 01/17/2010 [facility] Patient Information 2011 [place], [place].

## 2011-08-10 NOTE — Progress Notes (Signed)
  Subjective:    Patient ID: Gordon Phillips, male    DOB: [birth year], 65 y.o.   MRN: [id number]  HPI  65 year old patient who is seen today for followup of his hypertension. He does home blood pressure monitoring and has had some elevated readings. His blood pressure monitor was compared with our office cuff and his ran consistently high blood pressure here today as low as 120/86 A few nights ago he developed some reflux and felt that he had aspirated slightly. He has had persistent nonproductive cough. He also complains of urinary frequency and slow urinary stream.    Review of Systems  Constitutional: Negative for fever, chills, appetite change and fatigue.  HENT: Negative for hearing loss, ear pain, congestion, sore throat, trouble swallowing, neck stiffness, dental problem, voice change and tinnitus.   Eyes: Negative for pain, discharge and visual disturbance.  Respiratory: Positive for cough. Negative for chest tightness, wheezing and stridor.   Cardiovascular: Negative for chest pain, palpitations and leg swelling.  Gastrointestinal: Negative for nausea, vomiting, abdominal pain, diarrhea, constipation, blood in stool and abdominal distention.  Genitourinary: Positive for frequency, decreased urine volume and difficulty urinating. Negative for urgency, hematuria, flank pain, discharge and genital sores.  Musculoskeletal: Negative for myalgias, back pain, joint swelling, arthralgias and gait problem.  Skin: Negative for rash.  Neurological: Negative for dizziness, syncope, speech difficulty, weakness, numbness and headaches.  Hematological: Negative for adenopathy. Does not bruise/bleed easily.  Psychiatric/Behavioral: Negative for behavioral problems and dysphoric mood. The patient is not nervous/anxious.        Objective:   Physical Exam  Constitutional: He is oriented to person, place, and time. He appears well-developed.  HENT:  Head: Normocephalic.  Right Ear: External ear  normal.  Left Ear: External ear normal.  Eyes: Conjunctivae and EOM are normal.  Neck: Normal range of motion.  Cardiovascular: Normal rate and normal heart sounds.   Pulmonary/Chest: Effort normal and breath sounds normal. No respiratory distress. He has no wheezes.  Abdominal: Bowel sounds are normal.  Musculoskeletal: Normal range of motion. He exhibits no edema and no tenderness.  Neurological: He is alert and oriented to person, place, and time.  Psychiatric: He has a normal mood and affect. His behavior is normal.          Assessment & Plan:   Hypertension. Probably recently well-controlled Will obtain a new low pressure monitor  with an oversized cuff Cough. Will treat symptomatically and placed on short-term PPI. And to a reflux regimen also discussed Symptomatic BPH. Will place on Flomax 0.4 mg daily  Medicines refilled. Will schedule for a complete physical

## 2011-11-21 ENCOUNTER — Ambulatory Visit: Payer: PRIVATE HEALTH INSURANCE | Admitting: Internal Medicine

## 2012-04-04 ENCOUNTER — Ambulatory Visit (INDEPENDENT_AMBULATORY_CARE_PROVIDER_SITE_OTHER): Payer: PRIVATE HEALTH INSURANCE | Admitting: Internal Medicine

## 2012-04-04 ENCOUNTER — Encounter: Payer: Self-pay | Admitting: Internal Medicine

## 2012-04-04 VITALS — BP 120/74 | Temp 98.5°F | Wt 267.0 lb

## 2012-04-04 DIAGNOSIS — I10 Essential (primary) hypertension: Secondary | ICD-10-CM

## 2012-04-04 DIAGNOSIS — J309 Allergic rhinitis, unspecified: Secondary | ICD-10-CM

## 2012-04-04 MED ORDER — TERBINAFINE HCL 250 MG PO TABS: 250.0000 mg | ORAL_TABLET | Freq: Every day | ORAL | Status: DC

## 2012-04-04 NOTE — Progress Notes (Signed)
  Subjective:    Patient ID: Gordon Phillips, male    DOB: 1946/06/29, 66 y.o.   MRN: [id number]  HPI  66 year old patient who is seen today for followup. He has a history of allergic rhinitis and some chronic rhinorrhea. For the past 2 weeks he has had increasing cough nasal congestion hoarseness. Cough is nonproductive his chief complaint is hoarseness He has treated hypertension which has been stable. He has a history of a slightly depressed testosterone level this was discussed. He does not wish to consider treatment or followup testosterone level at this time   Review of Systems  Constitutional: Negative for fever, chills, appetite change and fatigue.  HENT: Positive for congestion, rhinorrhea, voice change, postnasal drip and sinus pressure. Negative for hearing loss, ear pain, sore throat, trouble swallowing, neck stiffness, dental problem and tinnitus.   Eyes: Negative for pain, discharge and visual disturbance.  Respiratory: Positive for cough. Negative for chest tightness, wheezing and stridor.   Cardiovascular: Negative for chest pain, palpitations and leg swelling.  Gastrointestinal: Negative for nausea, vomiting, abdominal pain, diarrhea, constipation, blood in stool and abdominal distention.  Genitourinary: Negative for urgency, hematuria, flank pain, discharge, difficulty urinating and genital sores.  Musculoskeletal: Negative for myalgias, back pain, joint swelling, arthralgias and gait problem.  Skin: Negative for rash.  Neurological: Negative for dizziness, syncope, speech difficulty, weakness, numbness and headaches.  Hematological: Negative for adenopathy. Does not bruise/bleed easily.  Psychiatric/Behavioral: Negative for behavioral problems and dysphoric mood. The patient is not nervous/anxious.        Objective:   Physical Exam  Constitutional: He is oriented to person, place, and time. He appears well-developed and well-nourished. No distress.       hoarse  HENT:    Head: Normocephalic.  Right Ear: External ear normal.  Left Ear: External ear normal.  Eyes: Conjunctivae and EOM are normal.  Neck: Normal range of motion.  Cardiovascular: Normal rate and normal heart sounds.   Pulmonary/Chest: Breath sounds normal.  Abdominal: Bowel sounds are normal.  Musculoskeletal: Normal range of motion. He exhibits no edema and no tenderness.  Neurological: He is alert and oriented to person, place, and time.  Psychiatric: He has a normal mood and affect. His behavior is normal.          Assessment & Plan:   Viral URI versus flare of allergic rhinitis. We'll treat with the Nasonex. Samples were provided we'll also treat with antihistamines. Will call if unimproved Hypertension stable

## 2012-04-04 NOTE — Patient Instructions (Signed)
Substitute benicar for 4 weeks  Take medications as directed  Limit your sodium (Salt) intake  Return in 6 months for follow-up

## 2012-05-23 ENCOUNTER — Ambulatory Visit: Payer: PRIVATE HEALTH INSURANCE | Admitting: Internal Medicine

## 2012-05-27 ENCOUNTER — Encounter: Payer: Self-pay | Admitting: Internal Medicine

## 2012-05-27 ENCOUNTER — Ambulatory Visit (INDEPENDENT_AMBULATORY_CARE_PROVIDER_SITE_OTHER): Payer: PRIVATE HEALTH INSURANCE | Admitting: Internal Medicine

## 2012-05-27 VITALS — BP 112/80 | Wt 259.0 lb

## 2012-05-27 DIAGNOSIS — J309 Allergic rhinitis, unspecified: Secondary | ICD-10-CM

## 2012-05-27 DIAGNOSIS — R7309 Other abnormal glucose: Secondary | ICD-10-CM

## 2012-05-27 DIAGNOSIS — I10 Essential (primary) hypertension: Secondary | ICD-10-CM

## 2012-05-27 DIAGNOSIS — R7302 Impaired glucose tolerance (oral): Secondary | ICD-10-CM

## 2012-05-27 DIAGNOSIS — E0590 Thyrotoxicosis, unspecified without thyrotoxic crisis or storm: Secondary | ICD-10-CM

## 2012-05-27 DIAGNOSIS — E785 Hyperlipidemia, unspecified: Secondary | ICD-10-CM

## 2012-05-27 MED ORDER — FLUTICASONE PROPIONATE 50 MCG/ACT NA SUSP: 2.0000 | Freq: Every day | NASAL | Status: DC

## 2012-05-27 NOTE — Patient Instructions (Signed)
Substitute Benicar  For lisinopril  Limit your sodium (Salt) intake    It is important that you exercise regularly, at least 20 minutes 3 to 4 times per week.  If you develop chest pain or shortness of breath seek  medical attention.  You need to lose weight.  Consider a lower calorie diet and regular exercise.  Please check your blood pressure on a regular basis.  If it is consistently greater than 150/90, please make an office appointment.

## 2012-05-27 NOTE — Progress Notes (Signed)
Subjective:    Patient ID: Gordon Phillips, male    DOB: Jul 01, 1946, 66 y.o.   MRN: [id number]  HPI  66 year old patient who is seen today for followup of his hypertension. He was seen a couple months ago complaining of cough which persists he was given a couple months of Benicar to substitute for lisinopril. However he continues to take the lisinopril. His blood pressure is well-controlled today. He has a history of impaired glucose tolerance and obesity. No recent lab. He is completing 12 weeks of Lamisil therapy.  Past Medical History  Diagnosis Date  . HYPERTHYROIDISM 01/25/2010  . HYPERTENSION 01/25/2010  . ALLERGIC RHINITIS 01/25/2010  . Vitiligo   . Obesity   . Pericarditis 1980's  . Glaucoma     borerline    History   Social History  . Marital Status: Married    Spouse Name: N/A    Number of Children: N/A  . Years of Education: N/A   Occupational History  . Not on file.   Social History Main Topics  . Smoking status: Never Smoker   . Smokeless tobacco: Not on file  . Alcohol Use: Yes     occasional  . Drug Use: No  . Sexually Active: Not on file   Other Topics Concern  . Not on file   Social History Narrative  . No narrative on file    Past Surgical History  Procedure Date  . Hernia repair     umbilical  . Lumbar laminectomy   . Inguinal hernia repair     Family History  Problem Relation Age of Onset  . Thyroid disease Sister     goiter    No Known Allergies  Current Outpatient Prescriptions on File Prior to Visit  Medication Sig Dispense Refill  . lisinopril (PRINIVIL,ZESTRIL) 20 MG tablet Take 1 tablet (20 mg total) by mouth daily.  90 tablet  4  . metoprolol (TOPROL-XL) 50 MG 24 hr tablet Take 1 tablet (50 mg total) by mouth daily.  90 tablet  4  . sildenafil (VIAGRA) 100 MG tablet Take 1 tablet (100 mg total) by mouth as needed for erectile dysfunction.  20 tablet  1  . terbinafine (LAMISIL) 250 MG tablet Take 1 tablet (250 mg total) by mouth  daily.  45 tablet  0  . fluticasone (FLONASE) 50 MCG/ACT nasal spray Place 2 sprays into the nose daily.  16 g  6    BP 112/80  Wt 259 lb (117.482 kg)       Review of Systems  Constitutional: Negative for fever, chills, appetite change and fatigue.  HENT: Positive for rhinorrhea and postnasal drip. Negative for hearing loss, ear pain, congestion, sore throat, trouble swallowing, neck stiffness, dental problem, voice change and tinnitus.   Eyes: Negative for pain, discharge and visual disturbance.  Respiratory: Positive for cough. Negative for chest tightness, wheezing and stridor.   Cardiovascular: Negative for chest pain, palpitations and leg swelling.  Gastrointestinal: Negative for nausea, vomiting, abdominal pain, diarrhea, constipation, blood in stool and abdominal distention.  Genitourinary: Negative for urgency, hematuria, flank pain, discharge, difficulty urinating and genital sores.  Musculoskeletal: Negative for myalgias, back pain, joint swelling, arthralgias and gait problem.  Skin: Negative for rash.  Neurological: Negative for dizziness, syncope, speech difficulty, weakness, numbness and headaches.  Hematological: Negative for adenopathy. Does not bruise/bleed easily.  Psychiatric/Behavioral: Negative for behavioral problems and dysphoric mood. The patient is not nervous/anxious.        Objective:  Physical Exam  Constitutional: He is oriented to person, place, and time. He appears well-developed.  HENT:  Head: Normocephalic.  Right Ear: External ear normal.  Left Ear: External ear normal.  Eyes: Conjunctivae and EOM are normal.  Neck: Normal range of motion.  Cardiovascular: Normal rate and normal heart sounds.   Pulmonary/Chest: Breath sounds normal.  Abdominal: Bowel sounds are normal.  Musculoskeletal: Normal range of motion. He exhibits no edema and no tenderness.  Neurological: He is alert and oriented to person, place, and time.  Psychiatric: He has a  normal mood and affect. His behavior is normal.          Assessment & Plan:   Persistent cough probable secondary to ACE inhibition but the patient does have a history of allergic rhinitis and postnasal drip. Will treat with fluticasone. He was told again to substitute Benicar for lisinopril.  Impaired glucose tolerance. We'll check a hemoglobin A1c  Hypertension stable History of hyperthyroidism. We'll check a TSH  CPX in 6 months

## 2012-05-28 LAB — CBC WITH DIFFERENTIAL/PLATELET
Basophils Absolute: 0 10*3/uL (ref 0.0–0.1)
Basophils Relative: 0.3 % (ref 0.0–3.0)
Eosinophils Absolute: 0.1 10*3/uL (ref 0.0–0.7)
Eosinophils Relative: 2.2 % (ref 0.0–5.0)
HCT: 43.5 % (ref 39.0–52.0)
Hemoglobin: 14.7 g/dL (ref 13.0–17.0)
Lymphocytes Relative: 24.7 % (ref 12.0–46.0)
Lymphs Abs: 1.6 10*3/uL (ref 0.7–4.0)
MCHC: 33.7 g/dL (ref 30.0–36.0)
MCV: 92.9 fl (ref 78.0–100.0)
Monocytes Absolute: 0.4 10*3/uL (ref 0.1–1.0)
Monocytes Relative: 6.5 % (ref 3.0–12.0)
Neutro Abs: 4.3 10*3/uL (ref 1.4–7.7)
Neutrophils Relative %: 66.3 % (ref 43.0–77.0)
Platelets: 141 10*3/uL — ABNORMAL LOW (ref 150.0–400.0)
RBC: 4.68 Mil/uL (ref 4.22–5.81)
RDW: 13.7 % (ref 11.5–14.6)
WBC: 6.5 10*3/uL (ref 4.5–10.5)

## 2012-05-28 LAB — COMPREHENSIVE METABOLIC PANEL
ALT: 20 U/L (ref 0–53)
AST: 26 U/L (ref 0–37)
Albumin: 4.4 g/dL (ref 3.5–5.2)
Alkaline Phosphatase: 40 U/L (ref 39–117)
BUN: 17 mg/dL (ref 6–23)
CO2: 25 mEq/L (ref 19–32)
Calcium: 9.5 mg/dL (ref 8.4–10.5)
Chloride: 105 mEq/L (ref 96–112)
Creatinine, Ser: 1 mg/dL (ref 0.4–1.5)
GFR: 92.17 mL/min (ref >60.00)
Glucose, Bld: 120 mg/dL — ABNORMAL HIGH (ref 70–99)
Potassium: 4 mEq/L (ref 3.5–5.1)
Sodium: 137 mEq/L (ref 135–145)
Total Bilirubin: 0.9 mg/dL (ref 0.3–1.2)
Total Protein: 8 g/dL (ref 6.0–8.3)

## 2012-05-28 LAB — LIPID PANEL
Cholesterol: 222 mg/dL — ABNORMAL HIGH (ref 0–200)
HDL: 48 mg/dL (ref >39.00)
Total CHOL/HDL Ratio: 5
Triglycerides: 80 mg/dL (ref 0.0–149.0)
VLDL: 16 mg/dL (ref 0.0–40.0)

## 2012-05-28 LAB — LDL CHOLESTEROL, DIRECT: Direct LDL: 160.1 mg/dL

## 2012-05-28 LAB — HEMOGLOBIN A1C: Hgb A1c MFr Bld: 6.2 % (ref 4.6–6.5)

## 2012-05-28 LAB — TSH: TSH: 0.45 u[IU]/mL (ref 0.35–5.50)

## 2012-07-11 ENCOUNTER — Other Ambulatory Visit: Payer: Self-pay | Admitting: Internal Medicine

## 2012-07-11 DIAGNOSIS — J309 Allergic rhinitis, unspecified: Secondary | ICD-10-CM

## 2012-07-11 MED ORDER — FLUTICASONE PROPIONATE 50 MCG/ACT NA SUSP: 2.0000 | Freq: Every day | NASAL | Status: DC

## 2012-07-11 NOTE — Telephone Encounter (Signed)
Pt needs new rx flonase sent to [facility] [address] [phone]

## 2012-07-23 ENCOUNTER — Other Ambulatory Visit: Payer: Self-pay | Admitting: Internal Medicine

## 2012-07-23 MED ORDER — METOPROLOL SUCCINATE ER 50 MG PO TB24: 50.0000 mg | ORAL_TABLET | Freq: Every day | ORAL | Status: DC

## 2012-07-23 NOTE — Telephone Encounter (Signed)
Sent corrected pharmacy rx to [facility]

## 2012-07-23 NOTE — Telephone Encounter (Signed)
Pt came by and needs a 30 day supply of metoprolol (TOPROL-XL) 50 MG 24 hr tablet to [facility] on [address]. Pls call in to pharmacy or write script and pt will pick up. Pt only has 4-5 pills lft.

## 2012-07-23 NOTE — Addendum Note (Signed)
Addended by: Duard Brady I on: 07/23/2012 03:40 PM   Modules accepted: Orders

## 2012-08-21 ENCOUNTER — Telehealth: Payer: Self-pay | Admitting: Internal Medicine

## 2012-08-21 ENCOUNTER — Ambulatory Visit (INDEPENDENT_AMBULATORY_CARE_PROVIDER_SITE_OTHER): Payer: Medicare Other

## 2012-08-21 DIAGNOSIS — Z23 Encounter for immunization: Secondary | ICD-10-CM

## 2012-08-21 NOTE — Telephone Encounter (Signed)
Pt came by and needs a script for Benicar 20mg  sent in to [facility] on [address]. Pt usually take samples of this med, but would like a script.

## 2012-08-22 MED ORDER — OLMESARTAN MEDOXOMIL 20 MG PO TABS: 20.0000 mg | ORAL_TABLET | Freq: Every day | ORAL | Status: DC

## 2012-08-22 NOTE — Telephone Encounter (Signed)
done

## 2012-08-22 NOTE — Telephone Encounter (Signed)
Please advise ok send new rx- not on current med list

## 2012-08-22 NOTE — Telephone Encounter (Signed)
ok 

## 2012-11-04 ENCOUNTER — Other Ambulatory Visit: Payer: Self-pay | Admitting: Internal Medicine

## 2012-11-04 NOTE — Telephone Encounter (Signed)
Pt needs new rx sent to [facility] edarbi 20 mg #90 with 3 refills

## 2012-11-04 NOTE — Telephone Encounter (Signed)
Pt needs something else other than Benicar, insurance will not pay. Needs new Rx.

## 2012-11-04 NOTE — Telephone Encounter (Signed)
Please change prescription to Benicar 40 #90 refill x4

## 2012-11-04 NOTE — Telephone Encounter (Signed)
Spoke to pt's wife needs generic for Benicar or something else insurance will not pay. Told her okay will get back to pt.

## 2012-11-05 MED ORDER — METOPROLOL SUCCINATE ER 50 MG PO TB24: 50.0000 mg | ORAL_TABLET | Freq: Every day | ORAL | Status: DC

## 2012-11-05 MED ORDER — LOSARTAN POTASSIUM 100 MG PO TABS: 100.0000 mg | ORAL_TABLET | Freq: Every day | ORAL | Status: DC

## 2012-11-05 NOTE — Telephone Encounter (Signed)
Losartan 100 mg  #90  One daily  RF 4

## 2012-11-05 NOTE — Telephone Encounter (Signed)
Left message on voicemail. Rx's were sent to [facility] and Rx was changed to Losartan 100 mg daily.

## 2012-11-07 NOTE — Telephone Encounter (Signed)
Left message on voicemail.

## 2012-11-08 NOTE — Telephone Encounter (Signed)
Pt stopped by office told him Losartan is equivalent to Benicar. Pt verbalized understanding.

## 2012-11-08 NOTE — Telephone Encounter (Signed)
Left detailed message on voicemail, Losartan 100 mg is equivalent to Benicar 40 mg.

## 2012-11-14 ENCOUNTER — Telehealth: Payer: Self-pay | Admitting: Internal Medicine

## 2012-11-14 MED ORDER — LOSARTAN POTASSIUM 100 MG PO TABS: 100.0000 mg | ORAL_TABLET | Freq: Every day | ORAL | Status: DC

## 2012-11-14 MED ORDER — METOPROLOL SUCCINATE ER 50 MG PO TB24: 50.0000 mg | ORAL_TABLET | Freq: Every day | ORAL | Status: DC

## 2012-11-14 NOTE — Telephone Encounter (Signed)
Spoke to pt told him will send 30 day supply of each Rx as requested to [facility] on [address].

## 2012-11-14 NOTE — Telephone Encounter (Signed)
Rx's sent to pharmacy.  

## 2012-11-14 NOTE — Telephone Encounter (Signed)
Please call patient when rxs have been sent in

## 2012-11-14 NOTE — Telephone Encounter (Signed)
Patient came in stating that he was not able to receive his meds through [facility] and will need to have them sent to [facility] on [address] scripts needed are : losartan potassium 100 mg 1poqd and metoprolol succinate 50 mg 1poqd. Please assist as patient is now completely out.

## 2012-11-19 ENCOUNTER — Other Ambulatory Visit: Payer: PRIVATE HEALTH INSURANCE

## 2012-11-21 ENCOUNTER — Other Ambulatory Visit (INDEPENDENT_AMBULATORY_CARE_PROVIDER_SITE_OTHER): Payer: Medicare Other

## 2012-11-21 DIAGNOSIS — I10 Essential (primary) hypertension: Secondary | ICD-10-CM

## 2012-11-21 DIAGNOSIS — Z0000 Encounter for general adult medical examination without abnormal findings: Secondary | ICD-10-CM

## 2012-11-21 DIAGNOSIS — R7309 Other abnormal glucose: Secondary | ICD-10-CM

## 2012-11-21 DIAGNOSIS — Z125 Encounter for screening for malignant neoplasm of prostate: Secondary | ICD-10-CM

## 2012-11-21 LAB — HEPATIC FUNCTION PANEL
ALT: 17 U/L (ref 0–53)
AST: 20 U/L (ref 0–37)
Albumin: 3.8 g/dL (ref 3.5–5.2)
Alkaline Phosphatase: 43 U/L (ref 39–117)
Bilirubin, Direct: 0.1 mg/dL (ref 0.0–0.3)
Total Bilirubin: 0.8 mg/dL (ref 0.3–1.2)
Total Protein: 7.1 g/dL (ref 6.0–8.3)

## 2012-11-21 LAB — BASIC METABOLIC PANEL
BUN: 18 mg/dL (ref 6–23)
CO2: 27 mEq/L (ref 19–32)
Calcium: 9.4 mg/dL (ref 8.4–10.5)
Chloride: 105 mEq/L (ref 96–112)
Creatinine, Ser: 1.3 mg/dL (ref 0.4–1.5)
GFR: 73.75 mL/min (ref >60.00)
Glucose, Bld: 107 mg/dL — ABNORMAL HIGH (ref 70–99)
Potassium: 4 mEq/L (ref 3.5–5.1)
Sodium: 139 mEq/L (ref 135–145)

## 2012-11-21 LAB — CBC WITH DIFFERENTIAL/PLATELET
Basophils Absolute: 0 10*3/uL (ref 0.0–0.1)
Basophils Relative: 0.5 % (ref 0.0–3.0)
Eosinophils Absolute: 0.2 10*3/uL (ref 0.0–0.7)
Eosinophils Relative: 3.9 % (ref 0.0–5.0)
HCT: 41.8 % (ref 39.0–52.0)
Hemoglobin: 14 g/dL (ref 13.0–17.0)
Lymphocytes Relative: 38.5 % (ref 12.0–46.0)
Lymphs Abs: 2.4 10*3/uL (ref 0.7–4.0)
MCHC: 33.5 g/dL (ref 30.0–36.0)
MCV: 91.3 fl (ref 78.0–100.0)
Monocytes Absolute: 0.5 10*3/uL (ref 0.1–1.0)
Monocytes Relative: 7.8 % (ref 3.0–12.0)
Neutro Abs: 3.1 10*3/uL (ref 1.4–7.7)
Neutrophils Relative %: 49.3 % (ref 43.0–77.0)
Platelets: 145 10*3/uL — ABNORMAL LOW (ref 150.0–400.0)
RBC: 4.58 Mil/uL (ref 4.22–5.81)
RDW: 14.1 % (ref 11.5–14.6)
WBC: 6.3 10*3/uL (ref 4.5–10.5)

## 2012-11-21 LAB — POCT URINALYSIS DIPSTICK
Bilirubin, UA: NEGATIVE
Glucose, UA: NEGATIVE
Ketones, UA: NEGATIVE
Leukocytes, UA: NEGATIVE
Nitrite, UA: NEGATIVE
Protein, UA: NEGATIVE
Spec Grav, UA: 1.025
Urobilinogen, UA: 0.2
pH, UA: 6

## 2012-11-21 LAB — TSH: TSH: 1 u[IU]/mL (ref 0.35–5.50)

## 2012-11-21 LAB — LIPID PANEL
Cholesterol: 205 mg/dL — ABNORMAL HIGH (ref 0–200)
HDL: 41.2 mg/dL (ref >39.00)
Total CHOL/HDL Ratio: 5
Triglycerides: 88 mg/dL (ref 0.0–149.0)
VLDL: 17.6 mg/dL (ref 0.0–40.0)

## 2012-11-21 LAB — PSA: PSA: 2.71 ng/mL (ref 0.10–4.00)

## 2012-11-21 LAB — LDL CHOLESTEROL, DIRECT: Direct LDL: 139.2 mg/dL

## 2012-11-26 ENCOUNTER — Encounter: Payer: Self-pay | Admitting: Internal Medicine

## 2012-11-26 ENCOUNTER — Ambulatory Visit (INDEPENDENT_AMBULATORY_CARE_PROVIDER_SITE_OTHER): Payer: Medicare Other | Admitting: Internal Medicine

## 2012-11-26 VITALS — BP 124/80 | HR 71 | Temp 97.8°F | Resp 18 | Ht 71.75 in | Wt 263.0 lb

## 2012-11-26 DIAGNOSIS — Z0000 Encounter for general adult medical examination without abnormal findings: Secondary | ICD-10-CM

## 2012-11-26 DIAGNOSIS — R7302 Impaired glucose tolerance (oral): Secondary | ICD-10-CM

## 2012-11-26 DIAGNOSIS — I10 Essential (primary) hypertension: Secondary | ICD-10-CM

## 2012-11-26 DIAGNOSIS — J309 Allergic rhinitis, unspecified: Secondary | ICD-10-CM

## 2012-11-26 DIAGNOSIS — R7309 Other abnormal glucose: Secondary | ICD-10-CM

## 2012-11-26 MED ORDER — METOPROLOL SUCCINATE ER 50 MG PO TB24: 50.0000 mg | ORAL_TABLET | Freq: Every day | ORAL | Status: DC

## 2012-11-26 MED ORDER — LOSARTAN POTASSIUM 100 MG PO TABS: 100.0000 mg | ORAL_TABLET | Freq: Every day | ORAL | Status: DC

## 2012-11-26 MED ORDER — FLUTICASONE PROPIONATE 50 MCG/ACT NA SUSP: 2.0000 | Freq: Every day | NASAL | Status: DC

## 2012-11-26 NOTE — Patient Instructions (Signed)

## 2012-11-26 NOTE — Progress Notes (Signed)
Subjective:     Patient ID: Gordon Phillips, male   DOB: 28-Feb-1946, 67 y.o.   MRN: [id number]  HPI     History of Present Illness:   67 year-old patient who is seen today for a comprehensive annual exam;  he has an approximate 10 year history of treated hypertension.  Medical problems include the hypertension, history of Graves' disease, history of pericarditis in the 46s and also a history of seasonal allergic rhinitis.  He is a heavy snorer and his wife has concerns about possible obstructive sleep apnea. He really denies any daytime sleepiness.   Preventive Screening-Counseling & Management  Alcohol-Tobacco  Smoking Status: quit   Allergies (verified):  No Known Drug Allergies   Past History:  Past Medical History:  Hypertension  Hyperthyroidism history of Graves' disease, status post I-131 1980s  vitiligo  obesity  history of borderline glaucoma  rule out obstructive sleep apnea  pericarditis 1980s  Allergic rhinitis   Past Surgical History:  Inguinal herniorrhaphy  Lumbar laminectomy  status post umbilical hernia repair  colonoscopy 2009   Family History:  Reviewed history and no changes required.  father died at 51. complications of cerebral vascular disease  mother age 72, lives independently  two brothers, one died complications. No cancer  one sister, history of thyroid disease, and goiter   Social History:  Reviewed history and no changes required.  Married  Airline pilot presently unemployed  two daughters, one with a history of breast cancerSmoking Status: quit    1. Risk factors, based on past  M,S,F history-  cardiovascular risk factors include hypertension.  2.  Physical activities: No activity restrictions  3.  Depression/mood: No history depression or mood disorder  4.  Hearing: No deficits  5.  ADL's: Totally independent in all aspects of daily living  6.  Fall risk: Low  7.  Home safety: No problems identified  8.  Height weight, and visual acuity;  height and weight stable no change in visual acuity  9.  Counseling: Weight loss encouraged regular exercise encouraged  10. Lab orders based on risk factors: Laboratory profile including lipid panel reviewed  11. Referral : Not appropriate at this time  12. Care plan: Weight loss exercise Gen. lifestyle modification discussed  13. Cognitive assessment: Alert and oriented with normal affect. No cognitive dysfunction.     Review of Systems  Constitutional: Negative for fever, chills, activity change, appetite change and fatigue.  HENT: Negative for hearing loss, ear pain, congestion, rhinorrhea, sneezing, mouth sores, trouble swallowing, neck pain, neck stiffness, dental problem, voice change, sinus pressure and tinnitus.   Eyes: Negative for photophobia, pain, redness and visual disturbance.  Respiratory: Negative for apnea, cough, choking, chest tightness, shortness of breath and wheezing.   Cardiovascular: Negative for chest pain, palpitations and leg swelling.  Gastrointestinal: Negative for nausea, vomiting, abdominal pain, diarrhea, constipation, blood in stool, abdominal distention, anal bleeding and rectal pain.  Genitourinary: Negative for dysuria, urgency, frequency, hematuria, flank pain, decreased urine volume, discharge, penile swelling, scrotal swelling, difficulty urinating, genital sores and testicular pain.  Musculoskeletal: Negative for myalgias, back pain, joint swelling, arthralgias and gait problem.  Skin: Negative for color change, rash and wound.  Neurological: Negative for dizziness, tremors, seizures, syncope, facial asymmetry, speech difficulty, weakness, light-headedness, numbness and headaches.  Hematological: Negative for adenopathy. Does not bruise/bleed easily.  Psychiatric/Behavioral: Negative for suicidal ideas, hallucinations, behavioral problems, confusion, sleep disturbance, self-injury, dysphoric mood, decreased concentration and agitation. The patient  is not nervous/anxious.  Objective:   Physical Exam  Constitutional: He appears well-developed and well-nourished.  HENT:  Head: Normocephalic and atraumatic.  Right Ear: External ear normal.  Left Ear: External ear normal.  Nose: Nose normal.  Mouth/Throat: Oropharynx is clear and moist.  Eyes: Conjunctivae normal and EOM are normal. Pupils are equal, round, and reactive to light. No scleral icterus.  Neck: Normal range of motion. Neck supple. No JVD present. No thyromegaly present.  Cardiovascular: Regular rhythm, normal heart sounds and intact distal pulses.  Exam reveals no gallop and no friction rub.   No murmur heard. Pulmonary/Chest: Effort normal and breath sounds normal. He exhibits no tenderness.  Abdominal: Soft. Bowel sounds are normal. He exhibits no distension and no mass. There is no tenderness.  Genitourinary: Prostate normal and penis normal.  Musculoskeletal: Normal range of motion. He exhibits no edema and no tenderness.  Lymphadenopathy:    He has no cervical adenopathy.  Neurological: He is alert. He has normal reflexes. No cranial nerve deficit. Coordination normal.  Skin: Skin is warm and dry. No rash noted.  Psychiatric: He has a normal mood and affect. His behavior is normal.       Assessment:     Preventive health exam Hypertension stable    Plan:     Weight loss encouraged Regular exercise encouraged We'll continue present regimen Medications refilled

## 2012-11-26 NOTE — Progress Notes (Deleted)
Patient ID: Gordon Phillips, male   DOB: 1946-01-20, 67 y.o.   MRN: [id number]

## 2012-12-14 ENCOUNTER — Encounter: Payer: Self-pay | Admitting: Family Medicine

## 2012-12-14 ENCOUNTER — Ambulatory Visit (INDEPENDENT_AMBULATORY_CARE_PROVIDER_SITE_OTHER): Payer: Medicare Other | Admitting: Family Medicine

## 2012-12-14 VITALS — BP 142/82 | HR 71 | Temp 98.4°F | Wt 267.5 lb

## 2012-12-14 DIAGNOSIS — M542 Cervicalgia: Secondary | ICD-10-CM

## 2012-12-14 NOTE — Progress Notes (Signed)
Subjective:    Patient ID: Gordon Phillips, male    DOB: Jun 07, 1946, 67 y.o.   MRN: [id number]  HPI Woke up 4 am on Thursday- pain in his "glands" in his neck - with tenderness to the touch Has had this in the past - and celebrex was px   Took some aspirin and it helped   He is feeling some better  Wife urged him to come  Still sore to the touch  Some asa last night before bed   No other symptoms at all -no fever or uri symptoms  He has always had allergies and nasal drip  Not around cats   Neck - No masses or thyromegaly or limitation in range of motion    Patient Active Problem List  Diagnosis  . HYPERTHYROIDISM  . HYPERTENSION  . ALLERGIC RHINITIS  . Impaired glucose tolerance  . Neck pain on left side   Past Medical History  Diagnosis Date  . HYPERTHYROIDISM 01/25/2010  . HYPERTENSION 01/25/2010  . ALLERGIC RHINITIS 01/25/2010  . Vitiligo   . Obesity   . Pericarditis 1980's  . Glaucoma     borerline   Past Surgical History  Procedure Laterality Date  . Hernia repair      umbilical  . Lumbar laminectomy    . Inguinal hernia repair     History  Substance Use Topics  . Smoking status: Never Smoker   . Smokeless tobacco: Not on file  . Alcohol Use: Yes     Comment: occasional   Family History  Problem Relation Age of Onset  . Thyroid disease Sister     goiter   No Known Allergies Current Outpatient Prescriptions on File Prior to Visit  Medication Sig Dispense Refill  . fluticasone (FLONASE) 50 MCG/ACT nasal spray Place 2 sprays into the nose daily.  16 g  2  . metoprolol succinate (TOPROL-XL) 50 MG 24 hr tablet Take 1 tablet (50 mg total) by mouth daily.  30 tablet  0  . sildenafil (VIAGRA) 100 MG tablet Take 1 tablet (100 mg total) by mouth as needed for erectile dysfunction.  20 tablet  1   No current facility-administered medications on file prior to visit.     Review of Systems Review of Systems  Constitutional: Negative for fever, appetite  change, fatigue and unexpected weight change.  Eyes: Negative for pain and visual disturbance.  ENT pos for post nasal drip on and off / also dry nose from cpap machine/ neg for ST or ha  Respiratory: Negative for cough and shortness of breath.   Cardiovascular: Negative for cp or palpitations    Gastrointestinal: Negative for nausea, diarrhea and constipation.  Genitourinary: Negative for urgency and frequency.  Skin: Negative for pallor or rash  neg for skin redness Neurological: Negative for weakness, light-headedness, numbness and headaches.  Hematological: Negative for adenopathy. Does not bruise/bleed easily.  Psychiatric/Behavioral: Negative for dysphoric mood. The patient is not nervous/anxious.         Objective:   Physical Exam  Constitutional: He appears well-developed and well-nourished. No distress.  overwt and well appearing   HENT:  Head: Normocephalic and atraumatic.  Right Ear: External ear normal.  Left Ear: External ear normal.  Mouth/Throat: Oropharynx is clear and moist. No oropharyngeal exudate.  Nares are dry- scabs bilaterally No sinus tenderness No facial swelling  Dentition fair -no acute findings   Throat is clear   Eyes: Conjunctivae and EOM are normal. Pupils are equal, round, and reactive  to light. Right eye exhibits no discharge. Left eye exhibits no discharge. No scleral icterus.  Neck: Normal range of motion. Neck supple. No JVD present. No tracheal deviation present. No thyromegaly present.  Cardiovascular: Normal rate, regular rhythm, normal heart sounds and intact distal pulses.   Pulmonary/Chest: Effort normal and breath sounds normal. No respiratory distress. He has no wheezes.  Abdominal: Soft. Bowel sounds are normal. He exhibits no distension and no mass. There is no tenderness.  Musculoskeletal: He exhibits no edema.  Lymphadenopathy:    He has no cervical adenopathy.  Neurological: He is alert. He has normal reflexes. No cranial nerve  deficit.  Skin: Skin is warm and dry. No rash noted. No erythema. No pallor.  Psychiatric: He has a normal mood and affect.          Assessment & Plan:

## 2012-12-14 NOTE — Assessment & Plan Note (Signed)
Pt previously had a feeling of fullness in neck- he suspected a swollen LN- but exam is normal today  ? If related to previous sinus issues  Pt is much improved after several doses of asa and other nsaid has worked well in the past  Adv to use ibuprofen prn- see AVS  Urged strongly to call if return of swelling/ pain or any other symptoms

## 2012-12-14 NOTE — Patient Instructions (Addendum)
I'm glad you neck pain is getting better Keep an eye out for return of swelling or pain - or worsening - or other symptoms like fever or sore throat  It's ok to take ibuprofen 400 mg with food up to three times daily as needed for pain  Update if not starting to improve in a week or if worsening

## 2013-02-02 ENCOUNTER — Observation Stay (HOSPITAL_COMMUNITY)
Admission: EM | Admit: 2013-02-02 | Discharge: 2013-02-03 | Disposition: A | Discharge status: Home / Self Care | Payer: Medicare Other | Attending: Internal Medicine | Admitting: Internal Medicine

## 2013-02-02 ENCOUNTER — Emergency Department (HOSPITAL_COMMUNITY): Payer: Medicare Other

## 2013-02-02 ENCOUNTER — Encounter (HOSPITAL_COMMUNITY): Payer: Self-pay | Admitting: Emergency Medicine

## 2013-02-02 DIAGNOSIS — R51 Headache: Secondary | ICD-10-CM

## 2013-02-02 DIAGNOSIS — E669 Obesity, unspecified: Secondary | ICD-10-CM

## 2013-02-02 DIAGNOSIS — R7309 Other abnormal glucose: Secondary | ICD-10-CM | POA: Insufficient documentation

## 2013-02-02 DIAGNOSIS — I10 Essential (primary) hypertension: Principal | ICD-10-CM | POA: Diagnosis present

## 2013-02-02 DIAGNOSIS — R7302 Impaired glucose tolerance (oral): Secondary | ICD-10-CM

## 2013-02-02 DIAGNOSIS — I160 Hypertensive urgency: Secondary | ICD-10-CM

## 2013-02-02 DIAGNOSIS — E785 Hyperlipidemia, unspecified: Secondary | ICD-10-CM | POA: Insufficient documentation

## 2013-02-02 DIAGNOSIS — G43909 Migraine, unspecified, not intractable, without status migrainosus: Secondary | ICD-10-CM | POA: Insufficient documentation

## 2013-02-02 DIAGNOSIS — K219 Gastro-esophageal reflux disease without esophagitis: Secondary | ICD-10-CM | POA: Insufficient documentation

## 2013-02-02 DIAGNOSIS — G4733 Obstructive sleep apnea (adult) (pediatric): Secondary | ICD-10-CM | POA: Insufficient documentation

## 2013-02-02 DIAGNOSIS — I200 Unstable angina: Secondary | ICD-10-CM | POA: Diagnosis present

## 2013-02-02 DIAGNOSIS — J309 Allergic rhinitis, unspecified: Secondary | ICD-10-CM

## 2013-02-02 DIAGNOSIS — R079 Chest pain, unspecified: Secondary | ICD-10-CM | POA: Insufficient documentation

## 2013-02-02 DIAGNOSIS — I249 Acute ischemic heart disease, unspecified: Secondary | ICD-10-CM

## 2013-02-02 DIAGNOSIS — E0590 Thyrotoxicosis, unspecified without thyrotoxic crisis or storm: Secondary | ICD-10-CM | POA: Diagnosis present

## 2013-02-02 HISTORY — DX: Cerebral infarction, unspecified: I63.9

## 2013-02-02 HISTORY — DX: Impaired glucose tolerance (oral): R73.02

## 2013-02-02 LAB — CBC WITH DIFFERENTIAL/PLATELET
Basophils Absolute: 0 10*3/uL (ref 0.0–0.1)
Basophils Relative: 0 % (ref 0–1)
Eosinophils Absolute: 0.1 10*3/uL (ref 0.0–0.7)
Eosinophils Relative: 2 % (ref 0–5)
HCT: 42.5 % (ref 39.0–52.0)
Hemoglobin: 14.8 g/dL (ref 13.0–17.0)
Lymphocytes Relative: 32 % (ref 12–46)
Lymphs Abs: 2.2 10*3/uL (ref 0.7–4.0)
MCH: 30.5 pg (ref 26.0–34.0)
MCHC: 34.8 g/dL (ref 30.0–36.0)
MCV: 87.6 fL (ref 78.0–100.0)
Monocytes Absolute: 0.4 10*3/uL (ref 0.1–1.0)
Monocytes Relative: 6 % (ref 3–12)
Neutro Abs: 4.1 10*3/uL (ref 1.7–7.7)
Neutrophils Relative %: 60 % (ref 43–77)
Platelets: 153 10*3/uL (ref 150–400)
RBC: 4.85 MIL/uL (ref 4.22–5.81)
RDW: 13.9 % (ref 11.5–15.5)
WBC: 6.8 10*3/uL (ref 4.0–10.5)

## 2013-02-02 LAB — POCT I-STAT TROPONIN I: Troponin i, poc: 0 ng/mL (ref 0.00–0.08)

## 2013-02-02 MED ORDER — METOPROLOL TARTRATE 1 MG/ML IV SOLN
INTRAVENOUS | Status: AC
  Administered 2013-02-02: 5 mg
  Filled 2013-02-02: qty 5

## 2013-02-02 MED ORDER — NITROGLYCERIN 2 % TD OINT
1.0000 [in_us] | TOPICAL_OINTMENT | Freq: Once | TRANSDERMAL | Status: AC
  Administered 2013-02-03: 1 [in_us] via TOPICAL
  Filled 2013-02-02: qty 1

## 2013-02-02 MED ORDER — METOPROLOL TARTRATE 1 MG/ML IV SOLN
5.0000 mg | INTRAVENOUS | Status: DC | PRN
  Administered 2013-02-02 – 2013-02-03 (×2): 5 mg via INTRAVENOUS
  Filled 2013-02-02: qty 5

## 2013-02-02 NOTE — ED Provider Notes (Signed)
History     CSN: [id number]  Arrival date & time 02/02/13  2234   First MD Initiated Contact with Patient 02/02/13 2301      Chief Complaint  Patient presents with  . Hypertension    (Consider location/radiation/quality/duration/timing/severity/associated sxs/prior treatment) HPI  67 yo man with a history of headaches which he describes as "migraine" headaches and HTN. He presents via EMS with chest pain and hypertension. The patient developed abrupt onset of chest pressure, 10/10, diffuse and radiating to the right arm, associated with SOB and diaphoresis. Patient's sx lasted approx 30m and resolved as he was en route to [facility] via EMS.  Paramedics note that the patient's BP was 240/140 at scene.   Patient denies history of similar sx. No history of CAD. Only CAD RF is HTN. Patient believes he had a stress test "years ago" and that it was unremarkable.   Pt had ASA 81mg  x 4 pta. He has not yet had his regularly scheduled dose of metoprolol XL 50mg  which he takes at night. No Viagra in > 1 month.   Patient notes that he has had mild, intermittent headache for the past few weeks. He says he has a history of migraine headaches and that this felt like previous migraine headaches. He is currently headache pain free. He describes associated aura. He has been concerned about numbness over the right side of his scalp intermittently for the past 2 days. No other neurologic deficits or sx.   Past Medical History  Diagnosis Date  . HYPERTHYROIDISM 01/25/2010  . HYPERTENSION 01/25/2010  . ALLERGIC RHINITIS 01/25/2010  . Vitiligo   . Obesity   . Pericarditis 1980's  . Glaucoma     borerline    Past Surgical History  Procedure Laterality Date  . Hernia repair      umbilical  . Lumbar laminectomy    . Inguinal hernia repair      Family History  Problem Relation Age of Onset  . Thyroid disease Sister     goiter    History  Substance Use Topics  . Smoking status: Never Smoker   .  Smokeless tobacco: Not on file  . Alcohol Use: Yes     Comment: occasional      Review of Systems Gen: no weight loss, fevers, chills, night sweats Eyes: no discharge or drainage, no occular pain or visual changes Nose: no epistaxis or rhinorrhea Mouth: no dental pain, no sore throat Neck: no neck pain Lungs: As per history of present illness, otherwise negative CV: As per history of present illness, otherwise negative Abd: no abdominal pain, nausea, vomiting GU: no dysuria or gross hematuria MSK: no myalgias or arthralgias Neuro:  as per history of present illness, otherwise negative Skin: no rash Psyche: negative.  Allergies  Relpax  Home Medications   Current Outpatient Rx  Name  Route  Sig  Dispense  Refill  . aspirin EC 81 MG tablet   Oral   Take 324 mg by mouth once.         . fluticasone (FLONASE) 50 MCG/ACT nasal spray   Nasal   Place 2 sprays into the nose daily.   16 g   2   . losartan (COZAAR) 25 MG tablet   Oral   Take 25 mg by mouth daily.         . metoprolol succinate (TOPROL-XL) 50 MG 24 hr tablet   Oral   Take 1 tablet (50 mg total) by mouth daily.  30 tablet   0   . sildenafil (VIAGRA) 100 MG tablet   Oral   Take 1 tablet (100 mg total) by mouth as needed for erectile dysfunction.   20 tablet   1     There were no vitals taken for this visit.  Physical Exam Gen: well developed and well nourished appearing Head: NCAT Eyes: PERL, EOMI Nose: no epistaixis or rhinorrhea Mouth/throat: mucosa is moist and pink Neck: supple, no stridor Lungs: Faint bibasilar rales, respiratory rate is 24 per minute, no retractions or accessory muscle use CV: Regular rate and rhythm, no murmur appreciated, no JVD, no peripheral edema. Extremities appear well perfused. Abd: Obese, soft, notender, nondistended Back: no ttp, no cva ttp Skin: no rashese, wnl Neuro: CN ii-xii grossly intact, no focal deficits Psyche; normal affect,  calm and  cooperative.   ED Course  Procedures (including critical care time)  DDX: ACS, pneumothorax, pneumonia, pericardial or pleural effusion, gastritis, GERD/PUD, musculoskeletal pain.   Results for orders placed during the hospital encounter of 02/02/13 (from the past 24 hour(s))  PRO B NATRIURETIC PEPTIDE     Status: None   Collection Time    02/02/13 11:26 PM      Result Value Range   Pro B Natriuretic peptide (BNP) 112.7  0 - 125 pg/mL  CBC WITH DIFFERENTIAL     Status: None   Collection Time    02/02/13 11:26 PM      Result Value Range   WBC 6.8  4.0 - 10.5 K/uL   RBC 4.85  4.22 - 5.81 MIL/uL   Hemoglobin 14.8  13.0 - 17.0 g/dL   HCT 09.8  11.9 - 14.7 %   MCV 87.6  78.0 - 100.0 fL   MCH 30.5  26.0 - 34.0 pg   MCHC 34.8  30.0 - 36.0 g/dL   RDW 82.9  56.2 - 13.0 %   Platelets 153  150 - 400 K/uL   Neutrophils Relative 60  43 - 77 %   Neutro Abs 4.1  1.7 - 7.7 K/uL   Lymphocytes Relative 32  12 - 46 %   Lymphs Abs 2.2  0.7 - 4.0 K/uL   Monocytes Relative 6  3 - 12 %   Monocytes Absolute 0.4  0.1 - 1.0 K/uL   Eosinophils Relative 2  0 - 5 %   Eosinophils Absolute 0.1  0.0 - 0.7 K/uL   Basophils Relative 0  0 - 1 %   Basophils Absolute 0.0  0.0 - 0.1 K/uL  COMPREHENSIVE METABOLIC PANEL     Status: Abnormal   Collection Time    02/02/13 11:26 PM      Result Value Range   Sodium 137  135 - 145 mEq/L   Potassium 3.8  3.5 - 5.1 mEq/L   Chloride 99  96 - 112 mEq/L   CO2 29  19 - 32 mEq/L   Glucose, Bld 135 (*) 70 - 99 mg/dL   BUN 17  6 - 23 mg/dL   Creatinine, Ser 8.65  0.50 - 1.35 mg/dL   Calcium 9.7  8.4 - 78.4 mg/dL   Total Protein 7.7  6.0 - 8.3 g/dL   Albumin 3.9  3.5 - 5.2 g/dL   AST 21  0 - 37 U/L   ALT 17  0 - 53 U/L   Alkaline Phosphatase 57  39 - 117 U/L   Total Bilirubin 0.4  0.3 - 1.2 mg/dL   GFR calc non Af  Amer 64 (*) >90 mL/min   GFR calc Af Amer 74 (*) >90 mL/min  PROTIME-INR     Status: None   Collection Time    02/02/13 11:26 PM      Result Value  Range   Prothrombin Time 12.2  11.6 - 15.2 seconds   INR 0.91  0.00 - 1.49  POCT I-STAT TROPONIN I     Status: None   Collection Time    02/02/13 11:52 PM      Result Value Range   Troponin i, poc 0.00  0.00 - 0.08 ng/mL   Comment 3             CXR: normal cardiac silloute, normal appearing mediastinum, no infiltrates, no acute process identified.   EKG: nsr, normal axis, questionable ST elevation in inferior leads with no reciprical changes. EKG reviewed with Cards Fellow, Dr. Leeann Must, who agrees that, in light of chest pain free status, we do not need to activate STEMI alert and can proceed with management as ACS.    MDM  As above. Hospitalist paged to admit. Pt has had ASA, NTG, BB. Remains pain free. Plavix and Lovenox ordered.         Brandt Loosen, MD 02/03/13 [phone]

## 2013-02-02 NOTE — ED Notes (Signed)
Per ems- pt reports issues with migraine for 3 weeks with numbness on right side of head today. Pt took relpax (not prescribed to him)- pt then began to have cp, dizziness and heart palpitations. Upon ems arrival pt only c/o chest pressure also his bp was elevated. pt initial bp L 230/120 R 240/140 HR 72.

## 2013-02-03 ENCOUNTER — Encounter (HOSPITAL_COMMUNITY): Payer: Self-pay | Admitting: *Deleted

## 2013-02-03 ENCOUNTER — Emergency Department (HOSPITAL_COMMUNITY): Payer: Medicare Other

## 2013-02-03 ENCOUNTER — Other Ambulatory Visit: Payer: Self-pay | Admitting: Physician Assistant

## 2013-02-03 DIAGNOSIS — I517 Cardiomegaly: Secondary | ICD-10-CM

## 2013-02-03 DIAGNOSIS — R7309 Other abnormal glucose: Secondary | ICD-10-CM

## 2013-02-03 DIAGNOSIS — I10 Essential (primary) hypertension: Secondary | ICD-10-CM

## 2013-02-03 DIAGNOSIS — K219 Gastro-esophageal reflux disease without esophagitis: Secondary | ICD-10-CM

## 2013-02-03 DIAGNOSIS — R079 Chest pain, unspecified: Secondary | ICD-10-CM

## 2013-02-03 DIAGNOSIS — J309 Allergic rhinitis, unspecified: Secondary | ICD-10-CM

## 2013-02-03 DIAGNOSIS — I200 Unstable angina: Secondary | ICD-10-CM

## 2013-02-03 DIAGNOSIS — E785 Hyperlipidemia, unspecified: Secondary | ICD-10-CM

## 2013-02-03 LAB — LIPID PANEL
Cholesterol: 202 mg/dL — ABNORMAL HIGH (ref 0–200)
HDL: 46 mg/dL (ref >39)
LDL Cholesterol: 137 mg/dL — ABNORMAL HIGH (ref 0–99)
Total CHOL/HDL Ratio: 4.4 RATIO
Triglycerides: 93 mg/dL (ref <150)
VLDL: 19 mg/dL (ref 0–40)

## 2013-02-03 LAB — TROPONIN I
Troponin I: <0.3 ng/mL (ref <0.30)
Troponin I: <0.3 ng/mL (ref <0.30)
Troponin I: <0.3 ng/mL (ref <0.30)

## 2013-02-03 LAB — CBC
HCT: 39.6 % (ref 39.0–52.0)
Hemoglobin: 13.5 g/dL (ref 13.0–17.0)
MCH: 30 pg (ref 26.0–34.0)
MCHC: 34.1 g/dL (ref 30.0–36.0)
MCV: 88 fL (ref 78.0–100.0)
Platelets: 132 10*3/uL — ABNORMAL LOW (ref 150–400)
RBC: 4.5 MIL/uL (ref 4.22–5.81)
RDW: 13.8 % (ref 11.5–15.5)
WBC: 6.7 10*3/uL (ref 4.0–10.5)

## 2013-02-03 LAB — URINALYSIS, ROUTINE W REFLEX MICROSCOPIC
Bilirubin Urine: NEGATIVE
Glucose, UA: NEGATIVE mg/dL
Hgb urine dipstick: NEGATIVE
Ketones, ur: NEGATIVE mg/dL
Leukocytes, UA: NEGATIVE
Nitrite: NEGATIVE
Protein, ur: NEGATIVE mg/dL
Specific Gravity, Urine: 1.023 (ref 1.005–1.030)
Urobilinogen, UA: 1 mg/dL (ref 0.0–1.0)
pH: 7 (ref 5.0–8.0)

## 2013-02-03 LAB — PROTIME-INR
INR: 0.91 (ref 0.00–1.49)
Prothrombin Time: 12.2 seconds (ref 11.6–15.2)

## 2013-02-03 LAB — COMPREHENSIVE METABOLIC PANEL
ALT: 17 U/L (ref 0–53)
AST: 21 U/L (ref 0–37)
Albumin: 3.9 g/dL (ref 3.5–5.2)
Alkaline Phosphatase: 57 U/L (ref 39–117)
BUN: 17 mg/dL (ref 6–23)
CO2: 29 mEq/L (ref 19–32)
Calcium: 9.7 mg/dL (ref 8.4–10.5)
Chloride: 99 mEq/L (ref 96–112)
Creatinine, Ser: 1.17 mg/dL (ref 0.50–1.35)
GFR calc Af Amer: 74 mL/min — ABNORMAL LOW (ref >90)
GFR calc non Af Amer: 64 mL/min — ABNORMAL LOW (ref >90)
Glucose, Bld: 135 mg/dL — ABNORMAL HIGH (ref 70–99)
Potassium: 3.8 mEq/L (ref 3.5–5.1)
Sodium: 137 mEq/L (ref 135–145)
Total Bilirubin: 0.4 mg/dL (ref 0.3–1.2)
Total Protein: 7.7 g/dL (ref 6.0–8.3)

## 2013-02-03 LAB — TSH: TSH: 1.769 u[IU]/mL (ref 0.350–4.500)

## 2013-02-03 LAB — HEPARIN LEVEL (UNFRACTIONATED): Heparin Unfractionated: 0.95 IU/mL — ABNORMAL HIGH (ref 0.30–0.70)

## 2013-02-03 LAB — PRO B NATRIURETIC PEPTIDE: Pro B Natriuretic peptide (BNP): 112.7 pg/mL (ref 0–125)

## 2013-02-03 LAB — HEMOGLOBIN A1C
Hgb A1c MFr Bld: 6.1 % — ABNORMAL HIGH (ref <5.7)
Mean Plasma Glucose: 128 mg/dL — ABNORMAL HIGH (ref <117)

## 2013-02-03 MED ORDER — NITROGLYCERIN 0.4 MG SL SUBL: 0.4000 mg | SUBLINGUAL_TABLET | SUBLINGUAL | Status: DC | PRN

## 2013-02-03 MED ORDER — ASPIRIN EC 81 MG PO TBEC: 81.0000 mg | DELAYED_RELEASE_TABLET | Freq: Every day | ORAL | Status: DC

## 2013-02-03 MED ORDER — SODIUM CHLORIDE 0.9 % IJ SOLN
3.0000 mL | Freq: Two times a day (BID) | INTRAMUSCULAR | Status: DC
  Administered 2013-02-03: 3 mL via INTRAVENOUS

## 2013-02-03 MED ORDER — OMEPRAZOLE 40 MG PO CPDR: 40.0000 mg | DELAYED_RELEASE_CAPSULE | Freq: Every day | ORAL | Status: DC

## 2013-02-03 MED ORDER — LOSARTAN POTASSIUM 25 MG PO TABS
25.0000 mg | ORAL_TABLET | Freq: Every day | ORAL | Status: DC
  Filled 2013-02-03: qty 1

## 2013-02-03 MED ORDER — ASPIRIN EC 81 MG PO TBEC
324.0000 mg | DELAYED_RELEASE_TABLET | Freq: Once | ORAL | Status: AC
  Administered 2013-02-03: 324 mg via ORAL
  Filled 2013-02-03: qty 4

## 2013-02-03 MED ORDER — ATORVASTATIN CALCIUM 20 MG PO TABS
20.0000 mg | ORAL_TABLET | Freq: Every day | ORAL | Status: DC
  Filled 2013-02-03: qty 1

## 2013-02-03 MED ORDER — CLOPIDOGREL BISULFATE 300 MG PO TABS
300.0000 mg | ORAL_TABLET | Freq: Once | ORAL | Status: AC
  Administered 2013-02-03: 300 mg via ORAL
  Filled 2013-02-03: qty 1

## 2013-02-03 MED ORDER — HEPARIN (PORCINE) IN NACL 100-0.45 UNIT/ML-% IJ SOLN
1500.0000 [IU]/h | INTRAMUSCULAR | Status: DC
  Administered 2013-02-03: 1500 [IU]/h via INTRAVENOUS
  Filled 2013-02-03 (×2): qty 250

## 2013-02-03 MED ORDER — HEPARIN BOLUS VIA INFUSION
4000.0000 [IU] | Freq: Once | INTRAVENOUS | Status: AC
  Administered 2013-02-03: 4000 [IU] via INTRAVENOUS

## 2013-02-03 MED ORDER — METOPROLOL SUCCINATE ER 50 MG PO TB24
75.0000 mg | ORAL_TABLET | Freq: Every day | ORAL | Status: DC
  Administered 2013-02-03: 75 mg via ORAL
  Filled 2013-02-03: qty 1

## 2013-02-03 MED ORDER — ENOXAPARIN SODIUM 120 MG/0.8ML ~~LOC~~ SOLN
120.0000 mg | Freq: Once | SUBCUTANEOUS | Status: AC
  Administered 2013-02-03: 120 mg via SUBCUTANEOUS
  Filled 2013-02-03: qty 0.8

## 2013-02-03 MED ORDER — HEPARIN (PORCINE) IN NACL 100-0.45 UNIT/ML-% IJ SOLN
1300.0000 [IU]/h | INTRAMUSCULAR | Status: DC
  Administered 2013-02-03: 1300 [IU]/h via INTRAVENOUS
  Filled 2013-02-03: qty 250

## 2013-02-03 MED ORDER — PRAVASTATIN SODIUM 40 MG PO TABS: 40.0000 mg | ORAL_TABLET | Freq: Every day | ORAL | Status: DC

## 2013-02-03 MED ORDER — LOSARTAN POTASSIUM 50 MG PO TABS: 50.0000 mg | ORAL_TABLET | Freq: Every day | ORAL | Status: DC

## 2013-02-03 MED ORDER — SODIUM CHLORIDE 0.9 % IJ SOLN: 3.0000 mL | Freq: Two times a day (BID) | INTRAMUSCULAR | Status: DC

## 2013-02-03 MED ORDER — METOPROLOL SUCCINATE ER 100 MG PO TB24: 100.0000 mg | ORAL_TABLET | Freq: Every day | ORAL | Status: DC

## 2013-02-03 MED ORDER — LOSARTAN POTASSIUM 50 MG PO TABS
50.0000 mg | ORAL_TABLET | Freq: Every day | ORAL | Status: DC
  Administered 2013-02-03: 50 mg via ORAL
  Filled 2013-02-03: qty 1

## 2013-02-03 MED ORDER — ACETAMINOPHEN 325 MG PO TABS
650.0000 mg | ORAL_TABLET | Freq: Four times a day (QID) | ORAL | Status: DC | PRN
  Administered 2013-02-03: 650 mg via ORAL
  Filled 2013-02-03: qty 2

## 2013-02-03 MED ORDER — LABETALOL HCL 5 MG/ML IV SOLN
20.0000 mg | Freq: Once | INTRAVENOUS | Status: AC
  Administered 2013-02-03: 20 mg via INTRAVENOUS
  Filled 2013-02-03: qty 4

## 2013-02-03 MED ORDER — MORPHINE SULFATE 2 MG/ML IJ SOLN
2.0000 mg | INTRAMUSCULAR | Status: DC | PRN
Start: 2013-02-03 — End: 2013-02-03

## 2013-02-03 MED ORDER — SODIUM CHLORIDE 0.9 % IV SOLN: 250.0000 mL | INTRAVENOUS | Status: DC | PRN

## 2013-02-03 MED ORDER — SODIUM CHLORIDE 0.9 % IJ SOLN: 3.0000 mL | INTRAMUSCULAR | Status: DC | PRN

## 2013-02-03 MED ORDER — METOPROLOL SUCCINATE ER 50 MG PO TB24
50.0000 mg | ORAL_TABLET | Freq: Every day | ORAL | Status: DC
  Filled 2013-02-03: qty 1

## 2013-02-03 MED ORDER — TOPIRAMATE 25 MG PO TABS: ORAL_TABLET | ORAL | Status: DC

## 2013-02-03 NOTE — Discharge Summary (Signed)
Physician Discharge Summary  Gordon Phillips WUJ:[id number] DOB: 08/02/1946 DOA: 02/02/2013  PCP: Rogelia Boga, MD  Admit date: 02/02/2013 Discharge date: 02/03/2013  Time spent: >30 minutes  Recommendations for Outpatient Follow-up:  1-Reassess BP and adjust medications as needed 2-Close follow up to CBG's and A1C to determine on hypoglycemic agents 3-BMET to follow renal function after increased on ARB 4-liver function and lipid panel to be repeated in 3 months; medication adjustment as needed  Discharge Diagnoses:  CP Hx of HYPERTHYROIDISM HYPERTENSION Impaired glucose tolerance HLD GERD OSA Migraines   Discharge Condition: stable and improved. Will discharge home and advise him to follow with PCP, cardiology and neurology as instructed.  Diet recommendation: low sodium, low carbohydrates, low fat diet  Filed Weights   02/03/13 0357  Weight: 122.426 kg (269 lb 14.4 oz)    History of present illness:  67 y/o M with history of HTN, OSA, migraines, remote pericarditis, silent CVA noted by CT this admission but no known CAD who presented to [facility] with chest pain after taking Relpax for presumed migraine. He has history of migraine headaches in childhood with recent recurrence precipitated by visual field disturbance and scotomas, with onset of headache once these subside. However, yesterday morning he awoke with R sided numbness of his head which he has never had before. No other neurologic deficits. This persisted for several hours. He took ASA then his wife's Relpax, and shortly after developed substernal chest pressure, diaphoresis, palpitations and SOB - "I felt like I was having a heart attack." EMS was called and by the time they arrived and assessed him, his symptoms began to improve without intervention. Per ED notes, initial BP was 240/140 with HR 72. His chest pressure was almost completely gone by the time he arrived to the ER and palpitations had  improved. The chest pain lasted 30 minutes in total.    Hospital Course:  1.Chest pain - occurred following dose of Relpax, ? Coronary vasospasm vs GERD vs related to accelerated HTN.  -Troponins neg and no acute ischemic change son EKG or telemetry -2-D echo done and pending at discharge ([facility] cardiology to read) -Due to risk factors (agem gender, HTN, HLD, borderline DM); plan is to arrange stress test and further follow by cardiology as an outpatient. -Will discharge on ASA, B-blocker, Cozaar and statins. -cozaar and toprol adjusted for better control of his BP. -started on PPI  2. R sided head numbness and migraines - CT head negative for acute CVA. -will arrange for OP f/u with neurology -start patient topamax -control of risk factors and ASA for secondary prevention.   3. H/o migraine headaches with recent increase in frequency - see above.   4. Accelerated hypertension - low sodium diet and increased in his antihypertensive agents as mentioned above. -follow up with PCP in 2 weeks.   5. 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 Continue ASA for secondary prevention for now. -start statins -control HTN -follow with neurology.   6. OSA,- continue CPAP.  7-HLD: LDL 137; started on statins and advise to follow low fat diet  8-glucose intolerance: A1C 6.2 -will allow life style modifications and low carb diet for now.  -close follow up to CBG's and A1C as an outpatient to determine best time to start hypoglycemic regimen.  9-GERD: started on PPI   Procedures: 2- D echo results pending at discharge  Consultations:  cardiology  Discharge Exam: Filed Vitals:   02/03/13  0230 02/03/13 0315 02/03/13 0357 02/03/13 1018  BP: 160/90 147/99 159/92 141/92  Pulse: 80 78 75 70  Temp:   97.5 F (36.4 C)   TempSrc:   Oral   Resp: 23 17 18    Height:   6\' 2"  (1.88 m)   Weight:   122.426 kg (269 lb 14.4 oz)   SpO2: 95% 95% 95%      General: afebrile, no SOB and no CP Cardiovascular: S1 and S2, no rubs or gallops Respiratory: no crackles and no wheezing Abdomen: soft, no tenderness, no distension, positive BS Extremities: no edema or cyanosis Neuro: non focal deficit.  Discharge Instructions  Discharge Orders   Future Appointments Provider Department Dept Phone   02/05/2013 8:45 AM [facility] 1 ([place]) [facility] SITE 3 NUCLEAR MED [phone]   02/20/2013 10:10 AM Beatrice Lecher, PA-C [facility] Main Office [place]) [phone]   Future Orders Complete By Expires     Diet - low sodium heart healthy  As directed     Discharge instructions  As directed     Comments:      TAKE MEDICATIONS AS PRESCRIBED FOLLOW A LOW SODIUM DIET (< 2.5g OF SODIUM DAILY) FOLLOW A LOW CALORIE DIET (< 1800 CALORIES DAILY) FOLLOW A LOW CARBOHYDRATES AND LOW FAT DIET FOLLOW UP WITH PCP IN 2 WEEKS OUTPATIENT STRESS TEST TO BE ARRANGED BY CARDIOLOGY SERVICE CALL TO ARRANGE VISIT WITH NEUROLOGY        Medication List    TAKE these medications       aspirin EC 81 MG tablet  Take 1 tablet (81 mg total) by mouth daily.     fluticasone 50 MCG/ACT nasal spray  Commonly known as:  FLONASE  Place 2 sprays into the nose daily.     losartan 50 MG tablet  Commonly known as:  COZAAR  Take 1 tablet (50 mg total) by mouth daily.     metoprolol succinate 100 MG 24 hr tablet  Commonly known as:  TOPROL-XL  Take 1 tablet (100 mg total) by mouth daily.     omeprazole 40 MG capsule  Commonly known as:  PRILOSEC  Take 1 capsule (40 mg total) by mouth daily.     pravastatin 40 MG tablet  Commonly known as:  PRAVACHOL  Take 1 tablet (40 mg total) by mouth daily.     sildenafil 100 MG tablet  Commonly known as:  VIAGRA  Take 1 tablet (100 mg total) by mouth as needed for erectile dysfunction.     topiramate 25 MG tablet  Commonly known as:  TOPAMAX  Take 1 tablet by mouth daily at bedtime  X 1  week; then start taking medication BID           Follow-up Information   Follow up with [facility]. (We left a message on their scheduling line for a new patient appointment. Call their office if you have not heard from them within 1-2 days.)    Contact information:   [address] [place] [address] [phone]      Follow up with [facility]. (Stress test at [facility] 02/05/13 at 8:45am. See end of AVS for instructions.)    Contact information:   [address] [place] [address] [phone]       Follow up with Tereso Newcomer, PA-C. (02/20/13 at 10:10am)    Contact information:   [facility] [address] [place]  [place] [address] [phone]       Follow up with Rogelia Boga, MD. Schedule an appointment as soon as possible for a visit in 2 weeks.   Contact information:   [address] [place] Flake [place] [address] [phone]        The results of significant diagnostics from this hospitalization (including imaging, microbiology, ancillary and laboratory) are listed below for reference.    Significant Diagnostic Studies: Dg Chest 2 View  02/03/2013  *RADIOLOGY REPORT*  Clinical Data: Chest pressure  CHEST - 2 VIEW  Comparison: None.  Findings: Normal heart size.  Bibasilar atelectasis.  No pneumothorax or pleural effusion.  IMPRESSION: No active cardiopulmonary disease.   Original Report Authenticated By: Jolaine Click, M.D.    Ct Head Wo Contrast  02/03/2013  *RADIOLOGY REPORT*  Clinical Data: Migraines for 3 weeks.  Numbness on the right side of the head today.  Chest pain, dizziness, and heart palpitations. Elevated blood pressure.  CT HEAD WITHOUT CONTRAST  Technique:  Contiguous axial images were obtained from the base of the skull through the vertex without contrast.  Comparison: None.  Findings: Mild diffuse cerebral atrophy.  Low attenuation changes in the deep  white matter consistent with small vessel ischemia. Old lacunar infarct in the left basal ganglia.  No mass effect or midline shift.  No ventricular dilatation.  No abnormal extra-axial fluid collections.  Gray-white matter junctions are distinct. Basal cisterns are not effaced.  No evidence of acute intracranial hemorrhage.  No depressed skull fractures.  There is opacification of some of the right ethmoid air cells.  No acute air-fluid levels are demonstrated.  Mastoid air cells are not opacified.  IMPRESSION: No acute intracranial abnormalities.  Chronic atrophy and small vessel ischemic changes are identified.   Original Report Authenticated By: Burman Nieves, M.D.     Labs: Basic Metabolic Panel:  Recent Labs Lab 02/02/13 2326  NA 137  K 3.8  CL 99  CO2 29  GLUCOSE 135*  BUN 17  CREATININE 1.17  CALCIUM 9.7   Liver Function Tests:  Recent Labs Lab 02/02/13 2326  AST 21  ALT 17  ALKPHOS 57  BILITOT 0.4  PROT 7.7  ALBUMIN 3.9   CBC:  Recent Labs Lab 02/02/13 2326 02/03/13 1100  WBC 6.8 6.7  NEUTROABS 4.1  --   HGB 14.8 13.5  HCT 42.5 39.6  MCV 87.6 88.0  PLT 153 132*   Cardiac Enzymes:  Recent Labs Lab 02/03/13 0450 02/03/13 1100  TROPONINI <0.30 <0.30   BNP: BNP (last 3 results)  Recent Labs  02/02/13 2326  PROBNP 112.7    Signed:  Hildreth Orsak  [facility] 02/03/2013, 1:03 PM

## 2013-02-03 NOTE — Consult Note (Signed)
CARDIOLOGY CONSULT NOTE  Patient ID: Gordon Phillips, MRN: [id number], DOB/AGE: May 11, 1946 67 y.o. Admit date: 02/02/2013   Date of Consult: 02/03/2013 Primary Physician: Gordon Boga, MD Primary Cardiologist: Gordon Phillips, being seen by Gordon Phillips.  Chief Complaint: chest pain Reason for Consult: chest pain  HPI: Gordon Phillips is a 67 y/o M with history of HTN, OSA, migraines, remote pericarditis, silent CVA noted by CT this admission  but no known CAD who presented to [facility] with chest pain after taking Relpax for presumed migraine. He has history of migraine headaches in childhood with recent recurrence precipitated by visual field disturbance and scotomas, with onset of headache once these subside. However, yesterday morning he awoke with R sided numbness of his head which he has never had before. No other neurologic deficits. This persisted for several hours. He took ASA then his wife's Relpax, and shortly after developed substernal chest pressure, diaphoresis, palpitations and SOB - "I felt like I was having a heart attack." EMS was called and by the time they arrived and assessed him, his symptoms began to improve without intervention. Per ED notes, initial BP was 240/140 with HR 72. His chest pressure was almost completely gone by the time he arrived to the ER and palpitations had improved. The chest pain lasted 30 minutes in total. A NTG patch was then placed and he had development of a headache. He has been chest-pain-free since. EKG showed nonspecific changes, CXR nonacute, trop neg x 2, CMET/CBC unremarkable except glu 135. CT of the head showed no acute intracranial abnormality but did show old lacunar infarct in L basal ganglia which the patient was unaware of. He states his BP is generally controlled at home but has had instances these past weeks where he felt like it was running high but did not check it. Last OV BP 142/82. With IV BB therapy overnight, BP is decreased to  159/92 this AM.  Past Medical History  Diagnosis Date  . HYPERTHYROIDISM 01/25/2010  . HYPERTENSION 01/25/2010  . ALLERGIC RHINITIS 01/25/2010  . Vitiligo   . Obesity   . Pericarditis 1980's  . 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  . Impaired glucose tolerance   . OSA (obstructive sleep apnea)     Uses CPAP nightly  . Migraines       Most Recent Cardiac Studies: None in Epic but reports stress test / echo 4 yrs ago that were normal except leaky valve   Surgical History:  Past Surgical History  Procedure Laterality Date  . Hernia repair      umbilical  . Lumbar laminectomy    . Inguinal hernia repair       Home Meds: Prior to Admission medications   Medication Sig Start Date End Date Taking? Authorizing Provider  aspirin EC 81 MG tablet Take 324 mg by mouth once.   Yes Historical Provider, MD  fluticasone (FLONASE) 50 MCG/ACT nasal spray Place 2 sprays into the nose daily. 11/26/12 11/26/13 Yes Gordon Savers, MD  losartan (COZAAR) 25 MG tablet Take 25 mg by mouth daily.   Yes Historical Provider, MD  metoprolol succinate (TOPROL-XL) 50 MG 24 hr tablet Take 1 tablet (50 mg total) by mouth daily. 11/26/12  Yes Gordon Savers, MD  sildenafil (VIAGRA) 100 MG tablet Take 1 tablet (100 mg total) by mouth as needed for erectile dysfunction. 05/29/11 02/02/13 Yes Gordon Savers, MD    Inpatient Medications:  .  losartan  25 mg Oral Daily  . metoprolol succinate  50 mg Oral Daily  . sodium chloride  3 mL Intravenous Q12H  . sodium chloride  3 mL Intravenous Q12H    Allergies:  Allergies  Allergen Reactions  . Relpax (Eletriptan)     Chest pain, hypertension, sweating    History   Social History  . Marital Status: Married    Spouse Name: N/A    Number of Children: N/A  . Years of Education: N/A   Occupational History  . Not on file.   Social History Main Topics  . Smoking status: Former Games developer  . Smokeless tobacco: Not on file      Comment: Smoked for 8 yrs, quit remotely  . Alcohol Use: Yes     Comment: occasional - max 2x/week  . Drug Use: No  . Sexually Active: Not on file   Other Topics Concern  . Not on file   Social History Narrative  . No narrative on file     Family History  Problem Relation Age of Onset  . Thyroid disease Sister     goiter  . Stroke Father     Passed away in his 16s  . Sudden death Brother     ? Drug use, no autopsy  . Throat cancer Brother      Review of Systems: General: negative for chills, fever. +weight gain 10lbs in 6 months (not eating as healthy as before) Cardiovascular: see above Dermatological: +vitiligo, using light-box treatments Respiratory: negative for cough or wheezing Urologic: negative for hematuria Abdominal: negative for nausea, vomiting, diarrhea, bright red blood per rectum, melena, or hematemesis Neurologic: see above. Denies slurred speech, focal weakness. All other systems reviewed and are otherwise negative except as noted above.  Labs:  Recent Labs  02/03/13 0450  TROPONINI <0.30   Lab Results  Component Value Date   WBC 6.8 02/02/2013   HGB 14.8 02/02/2013   HCT 42.5 02/02/2013   MCV 87.6 02/02/2013   PLT 153 02/02/2013     Recent Labs Lab 02/02/13 2326  NA 137  K 3.8  CL 99  CO2 29  BUN 17  CREATININE 1.17  CALCIUM 9.7  PROT 7.7  BILITOT 0.4  ALKPHOS 57  ALT 17  AST 21  GLUCOSE 135*   Lab Results  Component Value Date   CHOL 202* 02/03/2013   HDL 46 02/03/2013   LDLCALC 137* 02/03/2013   TRIG 93 02/03/2013    Radiology/Studies:  Dg Chest 2 View  02/03/2013  *RADIOLOGY REPORT*  Clinical Data: Chest pressure  CHEST - 2 VIEW  Comparison: None.  Findings: Normal heart size.  Bibasilar atelectasis.  No pneumothorax or pleural effusion.  IMPRESSION: No active cardiopulmonary disease.   Original Report Authenticated By: Gordon Phillips, M.D.    Ct Head Wo Contrast  02/03/2013  *RADIOLOGY REPORT*  Clinical Data: Migraines for 3  weeks.  Numbness on the right side of the head today.  Chest pain, dizziness, and heart palpitations. Elevated blood pressure.  CT HEAD WITHOUT CONTRAST  Technique:  Contiguous axial images were obtained from the base of the skull through the vertex without contrast.  Comparison: None.  Findings: Mild diffuse cerebral atrophy.  Low attenuation changes in the deep white matter consistent with small vessel ischemia. Old lacunar infarct in the left basal ganglia.  No mass effect or midline shift.  No ventricular dilatation.  No abnormal extra-axial fluid collections.  Gray-white matter junctions are distinct. Basal cisterns  are not effaced.  No evidence of acute intracranial hemorrhage.  No depressed skull fractures.  There is opacification of some of the right ethmoid air cells.  No acute air-fluid levels are demonstrated.  Mastoid air cells are not opacified.  IMPRESSION: No acute intracranial abnormalities.  Chronic atrophy and small vessel ischemic changes are identified.   Original Report Authenticated By: Burman Nieves, M.D.     EKG: NSR 81bpm 1st degree AVB nonspecific ST-T changes, no reciprocal changes. PACs noted. ST elevation in lead II was seen on prior tracing. Tele: sinus rhythm/sinus arrhythmia with PACs  Physical Exam: Blood pressure 159/92, pulse 75, temperature 97.5 F (36.4 C), temperature source Oral, resp. rate 18, height 6\' 2"  (1.88 m), weight 269 lb 14.4 oz (122.426 kg), SpO2 95.00%. General: Well developed, well nourished obese AAM in no acute distress. +Diffuse vitiligo noted. Head: Normocephalic, atraumatic, sclera non-icteric, no xanthomas, nares are without discharge.  Neck: Negative for carotid bruits. JVD not elevated. Lungs: Clear bilaterally to auscultation without wheezes, rales, or rhonchi. Breathing is unlabored. Heart: RRR with S1 S2. No murmurs, rubs, or gallops appreciated. Abdomen: Soft, non-tender, non-distended with normoactive bowel sounds. No hepatomegaly. No  rebound/guarding. No obvious abdominal masses. Msk:  Strength and tone appear normal for age. Extremities: No clubbing or cyanosis. No edema.  Distal pedal pulses are 2+ and equal bilaterally. Neuro: Alert and oriented X 3. No facial asymmetry. No focal deficit. Moves all extremities spontaneously. Psych:  Responds to questions appropriately with a normal affect.   Assessment and Plan:   1. Chest pain - occurred following dose of Relpax, ? Coronary vasospasm vs related to accelerated HTN. Troponins neg so far, await another set. We will f/u echo results. Given age and HTN, will arrange for outpatient treadmill myoview. Continue ASA. Start statin, will need OP f/u labs. Avoid triptans in future. He was also educated on dangers of taking medications not specifically prescribed to him. 2. R sided head numbness - CT head negative for acute CVA, but given old CVA on CT as well as recent increase in migraine headaches, recommend OP f/u with neurology. We will try to arrange. 3. H/o migraine headaches with recent increase in frequency - see above. 4. Accelerated hypertension - recommend increasing Cozaar and Toprol. 5. Old CVA by CT this admission - see above. 6. OSA,- compliant with CPAP.  Signed, Ronie Spies PA-C 02/03/2013, 9:28 AM   Patient seen with PA, agree with the above note.  I suspect that his chest pain was related to triptan use => either in the setting of hypertensive urgency or due to coronary vasospasm.  He had never had chest pain before.  2 sets of negative cardiac enzymes so far.   - Will arrange outpatient Lexiscan Sestamibi (will use Lexiscan because BP is not adequately controlled and don't want to hold beta blocker).  - Neurology appointment (headaches, lacunar stroke, facial numbness).  - Increase Toprol XL to 75 daily and losartan to 50 mg daily => will followup BP as outpatient.  - Would start statin given history of CVA.   Marca Ancona 02/03/2013 10:07 AM

## 2013-02-03 NOTE — Progress Notes (Signed)
The following appointments have been made in the event that patient will go home if 3rd troponin is negative and echo is normal: - Lexiscan Myoview 02/05/13 at 8:45 am at [facility], instructions entered in epic - F/u Tereso Newcomer PA-C 02/20/13 at 10:10am - We left a message on [facility] scheduling line to call patient to set up an appointment.   Dayna Dunn PA-C

## 2013-02-03 NOTE — Progress Notes (Signed)
ANTICOAGULATION CONSULT NOTE - Initial Consult  Pharmacy Consult for heparin Indication: chest pain/ACS  Allergies  Allergen Reactions  . Relpax (Eletriptan)     Chest pain, hypertension, sweating    Patient Measurements: Height: 6\' 2"  (188 cm) Weight: 269 lb 14.4 oz (122.426 kg) IBW/kg (Calculated) : 82.2 Heparin Dosing Weight: 110kg  Vital Signs: Temp: 97.5 F (36.4 C) (03/31 0357) BP: 159/92 mmHg (03/31 0357) Pulse Rate: 75 (03/31 0357)  Labs:  Recent Labs  02/02/13 2326  HGB 14.8  HCT 42.5  PLT 153  LABPROT 12.2  INR 0.91  CREATININE 1.17    Estimated Creatinine Clearance: 87.5 ml/min (by C-G formula based on Cr of 1.17).   Medical History: Past Medical History  Diagnosis Date  . HYPERTHYROIDISM 01/25/2010  . HYPERTENSION 01/25/2010  . ALLERGIC RHINITIS 01/25/2010  . Vitiligo   . Obesity   . Pericarditis 1980's  . Glaucoma     borerline    Assessment: 67yo male c/o sudden onset of 10/10 CP, relieved to 2/10 with NTG paste, questionable ST elevation reviewed by cards fellow, thought not to be STEMI, initial troponin negative, to begin heparin.  Goal of Therapy:  Heparin level 0.3-0.7 units/ml Monitor platelets by anticoagulation protocol: Yes   Plan:  Will give heparin 4000 units IV bolus x1 followed by gtt at 1500 units/hr and monitor heparin levels and CBC.  Vernard Gambles, PharmD, BCPS  02/03/2013,4:00 AM

## 2013-02-03 NOTE — Progress Notes (Signed)
  Echocardiogram 2D Echocardiogram has been performed.  Trendon Zaring 02/03/2013, 2:29 PM

## 2013-02-03 NOTE — Progress Notes (Signed)
ANTICOAGULATION CONSULT NOTE - Follow Up Consult  Pharmacy Consult for heparin Indication: chest pain/ACS  Allergies  Allergen Reactions  . Relpax (Eletriptan)     Chest pain, hypertension, sweating    Patient Measurements: Height: 6\' 2"  (188 cm) Weight: 269 lb 14.4 oz (122.426 kg) IBW/kg (Calculated) : 82.2 Heparin Dosing Weight: 110kg  Vital Signs: Temp: 97.5 F (36.4 C) (03/31 0357) Temp src: Oral (03/31 0357) BP: 141/92 mmHg (03/31 1018) Pulse Rate: 70 (03/31 1018)  Labs:  Recent Labs  02/02/13 2326 02/03/13 0450 02/03/13 1100  HGB 14.8  --  13.5  HCT 42.5  --  39.6  PLT 153  --  132*  LABPROT 12.2  --   --   INR 0.91  --   --   HEPARINUNFRC  --   --  0.95*  CREATININE 1.17  --   --   TROPONINI  --  <0.30 <0.30    Estimated Creatinine Clearance: 87.5 ml/min (by C-G formula based on Cr of 1.17).   Medications:  Scheduled:  . [COMPLETED] aspirin EC  324 mg Oral Once  . atorvastatin  20 mg Oral q1800  . [COMPLETED] clopidogrel  300 mg Oral Once  . [COMPLETED] enoxaparin (LOVENOX) injection  120 mg Subcutaneous Once  . [COMPLETED] heparin  4,000 Units Intravenous Once  . [COMPLETED] labetalol  20 mg Intravenous Once  . losartan  50 mg Oral Daily  . [COMPLETED] metoprolol      . metoprolol succinate  75 mg Oral Daily  . [COMPLETED] nitroGLYCERIN  1 inch Topical Once  . sodium chloride  3 mL Intravenous Q12H  . sodium chloride  3 mL Intravenous Q12H  . [DISCONTINUED] losartan  25 mg Oral Daily  . [DISCONTINUED] metoprolol succinate  50 mg Oral Daily   Infusions:  . heparin 1,500 Units/hr (02/03/13 0451)    Assessment: 67 yo male here with CP and initial heparin level is 0.95. Noted patient my go home based on serial troponins abd echo results.  Goal of Therapy:  Heparin level 0.3-0.7 units/ml Monitor platelets by anticoagulation protocol: Yes   Plan: -Decrease heparin to 1300 units/hr -Heparin level in 6 hrs  Harland German, Pharm D 02/03/2013  12:43 PM

## 2013-02-03 NOTE — H&P (Addendum)
[facility]IST ADMISSION NOTE  Date: 02/03/2013               Patient Name:  Gordon Phillips MRN: [id number]  DOB: Mar 29, 1946 Age / Sex: 67 y.o., male   PCP: Gordy Savers, MD              Medical Service: Internal Medicine             Chief Complaint: chest pain   History of Present Illness: Patient is a 67 y.o. 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 Pain is described as a pressure chest pain located over left chest. The pain is rated as a 10/10 in severity at onset and currently rated 2/10. The pain is notably aggravated by nothing. It is alleviated by none. has associated diaphoresis, near syncope, palpitations and shortness of breath. For the pain, the patient tried Baby Aspirin x4 for pain, with no relief. Patient has been having headaches for past several days and tried migraine medications from his wife(Relpax). He found out over the Internet that the medication he took may precipitate angina like symptoms. Patient is otherwise fairly healthy and can walk without getting short of breath for several blocks. He bought an elliptical yesterday and was going to start exercising.  The patient has not experienced similar symptoms previously. Secondary to his constellation of symptoms, Gordon Phillips presented to [facility] for further evaluation. Otherwise, the patient denies nausea and syncope.  Current Outpatient Medications:  (Not in a hospital admission)  Allergies: Allergies  Allergen Reactions  . Relpax (Eletriptan)     Chest pain, hypertension, sweating     Past Medical History: Past Medical History  Diagnosis Date  . HYPERTHYROIDISM 01/25/2010  . HYPERTENSION 01/25/2010  . ALLERGIC RHINITIS 01/25/2010  . Vitiligo   . Obesity   . Pericarditis 1980's  . Glaucoma     borerline    Past Surgical History: Past Surgical History  Procedure  Laterality Date  . Hernia repair      umbilical  . Lumbar laminectomy    . Inguinal hernia repair      Family History: Family History  Problem Relation Age of Onset  . Thyroid disease Sister     goiter    Social History: History   Social History  . Marital Status: Married    Spouse Name: N/A    Number of Children: N/A  . Years of Education: N/A   Occupational History  . Not on file.   Social History Main Topics  . Smoking status: Never Smoker   . Smokeless tobacco: Not on file  . Alcohol Use: Yes     Comment: occasional  . Drug Use: No  . Sexually Active: Not on file   Other Topics Concern  . Not on file   Social History Narrative  . No narrative on file    Review of Systems: Constitutional:  denies fever, chills, appetite change and fatigue.  HEENT: denies photophobia, eye pain, redness, hearing loss, ear pain, congestion, sore throat, rhinorrhea, sneezing, neck pain, neck stiffness and tinnitus.  Respiratory: denies SOB, DOE, cough, chest tightness, and wheezing.  Cardiovascular: denies  and leg swelling.+ chest pain  Gastrointestinal: denies nausea, vomiting, abdominal pain, diarrhea, constipation, blood in stool.  Genitourinary: denies dysuria, urgency, frequency, hematuria, flank pain and difficulty urinating.  Musculoskeletal: denies  myalgias, back  pain, joint swelling, arthralgias and gait problem.   Skin: denies pallor, rash and wound.  Neurological: denies dizziness, seizures, syncope, weakness, light-headedness, numbness and headaches.   Hematological: denies adenopathy, easy bruising, personal or family bleeding history.  Psychiatric/ Behavioral: denies suicidal ideation, mood changes, confusion, nervousness, sleep disturbance and agitation.    Vital Signs: Blood pressure 147/99, pulse 78, resp. rate 17, SpO2 95.00%.  Physical Exam: General: Vital signs reviewed and noted. Well-developed, well-nourished, in no acute distress; alert, appropriate  and cooperative throughout examination.  Head: Normocephalic, atraumatic.  Eyes: PERRL, EOMI, No signs of anemia or jaundince.  Nose: Mucous membranes moist, not inflammed, nonerythematous.  Throat: Oropharynx nonerythematous, no exudate appreciated.   Neck: No deformities, masses, or tenderness noted.Supple, No carotid Bruits, no JVD.  Lungs:  Normal respiratory effort. Clear to auscultation BL without crackles or wheezes.  Heart: RRR. S1 and S2 normal without gallop, murmur, or rubs.  Abdomen:  BS normoactive. Soft, Nondistended, non-tender.  No masses or organomegaly.  Extremities: No pretibial edema.  Neurologic: A&O X3, CN II - XII are grossly intact. Motor strength is 5/5 in the all 4 extremities, Sensations intact to light touch, Cerebellar signs negative.  Skin: No visible rashes, scars.    Lab results: Basic Metabolic Panel:  Recent Labs  16/10/96 2326  NA 137  K 3.8  CL 99  CO2 29  GLUCOSE 135*  BUN 17  CREATININE 1.17  CALCIUM 9.7   Liver Function Tests:  Recent Labs  02/02/13 2326  AST 21  ALT 17  ALKPHOS 57  BILITOT 0.4  PROT 7.7  ALBUMIN 3.9   CBC:  Recent Labs  02/02/13 2326  WBC 6.8  NEUTROABS 4.1  HGB 14.8  HCT 42.5  MCV 87.6  PLT 153   BNP:  Recent Labs  02/02/13 2326  PROBNP 112.7  Coagulation:  Recent Labs  02/02/13 2326  LABPROT 12.2  INR 0.91   Urine Drug Screen:   Urinalysis:  Recent Labs  02/03/13 0140  COLORURINE YELLOW  LABSPEC 1.023  PHURINE 7.0  GLUCOSEU NEGATIVE  HGBUR NEGATIVE  BILIRUBINUR NEGATIVE  KETONESUR NEGATIVE  PROTEINUR NEGATIVE  UROBILINOGEN 1.0  NITRITE NEGATIVE  LEUKOCYTESUR NEGATIVE     Imaging results:  Dg Chest 2 View  02/03/2013  *RADIOLOGY REPORT*  Clinical Data: Chest pressure  CHEST - 2 VIEW  Comparison: None.  Findings: Normal heart size.  Bibasilar atelectasis.  No pneumothorax or pleural effusion.  IMPRESSION: No active cardiopulmonary disease.   Original Report Authenticated  By: Jolaine Click, M.D.    Ct Head Wo Contrast  02/03/2013  *RADIOLOGY REPORT*  Clinical Data: Migraines for 3 weeks.  Numbness on the right side of the head today.  Chest pain, dizziness, and heart palpitations. Elevated blood pressure.  CT HEAD WITHOUT CONTRAST  Technique:  Contiguous axial images were obtained from the base of the skull through the vertex without contrast.  Comparison: None.  Findings: Mild diffuse cerebral atrophy.  Low attenuation changes in the deep white matter consistent with small vessel ischemia. Old lacunar infarct in the left basal ganglia.  No mass effect or midline shift.  No ventricular dilatation.  No abnormal extra-axial fluid collections.  Gray-white matter junctions are distinct. Basal cisterns are not effaced.  No evidence of acute intracranial hemorrhage.  No depressed skull fractures.  There is opacification of some of the right ethmoid air cells.  No acute air-fluid levels are demonstrated.  Mastoid air cells are not opacified.  IMPRESSION: No acute  intracranial abnormalities.  Chronic atrophy and small vessel ischemic changes are identified.   Original Report Authenticated By: Burman Nieves, M.D.      Other results:  EKG (02/03/2013) - Normal Sinus Rhythm, regular rate of approximately 80 bpm, normal axis, ST segments: Questionable ST elevation which was discussed with cardiology fellow on call by the ER physician. This was thought to be not related to ST elevation MI.     Assessment & Plan: Pt is a 66 y.o. yo male with a PMHX of hypertension, obesity and previous history of pericarditis 20 years ago, who presented to [facility] on 02/02/2013 with symptoms of mid chest chest pain, with EKG showing ST elevation and initial troponins of negative, thereby confirming acute coronary syndrome involving the inferior myocardium. Therefore, interventions at this time will be focused on treatment of the ACS and risk factor stratification.    Unstable angina- patient does not  have known history of coronary artery disease. He also has cardiac risk factors including the following: hypertension  family history of premature CAD  obese. Admission EKG showed ST elevation, and cardiac enzymes have been negative. We will also strict risk factor modification and management.   Admit to telemetry  Continue to cycle cardiac enzymes.  Continue aspirin, beta blocker, heparin drip  May discontinue heparin drip after 3 negative cardiac enzymes  Continue morphine, oxygen, PRN nitroglycerin,  Check HgA1c for risk factor stratification.   TSH and lipid panel  Blood pressure control  Consult cardiology in the morning for stress test inpatient versus outpatient(patient requesting [facility])  HTN:  Paramedics noted high BP at the scene but has been moderately elevated in the ED. H has received a dose of labetalol and metoprolol in ED which has stabilized his BP. Continue to monitor on home meds for now.     DVT PPX - heparin drip  COMMUNICTION; Patien and the family were updated on the plan of care  CONSULTS PLACED - None  DISPO - D/c home likely in 1-2 days     Signed: Lars Mage, MD  02/03/2013, 3:47 AM   Please log onto [email or web address] for floor coverage details

## 2013-02-03 NOTE — Progress Notes (Signed)
Pt discharged to home per MD order. Pt received and reviewed all discharge instructions and medication information including follow-up appointments and prescriptions. Pt verbalized understanding. Pt alert and oriented at discharge with no complaints of pain.  Pt escorted to private vehicle via wheelchair by nurse tech. [name], Gordon Phillips  

## 2013-02-05 ENCOUNTER — Other Ambulatory Visit: Payer: Self-pay | Admitting: Physician Assistant

## 2013-02-05 ENCOUNTER — Ambulatory Visit (HOSPITAL_COMMUNITY): Payer: Medicare Other | Attending: Cardiology | Admitting: Radiology

## 2013-02-05 VITALS — BP 122/90 | HR 54 | Ht 74.0 in | Wt 263.0 lb

## 2013-02-05 DIAGNOSIS — R002 Palpitations: Secondary | ICD-10-CM | POA: Insufficient documentation

## 2013-02-05 DIAGNOSIS — R079 Chest pain, unspecified: Secondary | ICD-10-CM

## 2013-02-05 DIAGNOSIS — R0602 Shortness of breath: Secondary | ICD-10-CM | POA: Insufficient documentation

## 2013-02-05 MED ORDER — REGADENOSON 0.4 MG/5ML IV SOLN
0.4000 mg | Freq: Once | INTRAVENOUS | Status: AC
  Administered 2013-02-05: 0.4 mg via INTRAVENOUS

## 2013-02-05 MED ORDER — TECHNETIUM TC 99M SESTAMIBI GENERIC - CARDIOLITE
33.0000 | Freq: Once | INTRAVENOUS | Status: AC | PRN
  Administered 2013-02-05: 33 via INTRAVENOUS

## 2013-02-05 MED ORDER — TECHNETIUM TC 99M SESTAMIBI GENERIC - CARDIOLITE
11.0000 | Freq: Once | INTRAVENOUS | Status: AC | PRN
  Administered 2013-02-05: 11 via INTRAVENOUS

## 2013-02-05 NOTE — Progress Notes (Addendum)
  [facility] SITE 3 NUCLEAR MED [address] [place], [place] [address] [phone]    Cardiology Nuclear Med Study  Gordon Phillips is a 67 y.o. male     MRN : [id number]     DOB: 1946-05-28  Procedure Date: 02/05/2013  Nuclear Med Background Indication for Stress Test:  Evaluation for Ischemia and Post Hospital: 02/03/13 XLK:GMWNU pain, (-)enzymes History:  ~5 yrs ago MPS in [place]:OK per patient; 02/03/13 Echo:EF=55% Cardiac Risk Factors: CVA, History of Smoking, Hypertension, Lipids and Obesity  Symptoms:  Chest Pressure.  (last episode of chest discomfort:none since discharge), Diaphoresis, Palpitations and SOB   Nuclear Pre-Procedure Caffeine/Decaff Intake:  7:30pm NPO After: 8:00pm   Lungs:  Clear. O2 Sat: 96% on room air. IV 0.9% NS with Angio Cath:  22g  IV Site: R Hand  IV Started by:  Cathlyn Parsons, RN  Chest Size (in):  48 Cup Size: n/a  Height: 6\' 2"  (1.88 m)  Weight:  263 lb (119.296 kg)  BMI:  Body mass index is 33.75 kg/(m^2). Tech Comments:  Toprol taken at 2330    Nuclear Med Study 1 or 2 day study: 1 day  Stress Test Type:  Treadmill/Lexiscan  Reading MD: Charlton Haws, MD  Order Authorizing Provider:  Marca Ancona, MD  Resting Radionuclide: Technetium 23m Sestamibi  Resting Radionuclide Dose: 11.0 mCi   Stress Radionuclide:  Technetium 51m Sestamibi  Stress Radionuclide Dose: 32.8 mCi           Stress Protocol Rest HR: 54 Stress HR: 97  Rest BP: 122/90 Stress BP: 201/107  Exercise Time (min): 2:00 METS: n/a   Predicted Max HR: 155 bpm % Max HR: 62.58 bpm Rate Pressure Product: 27253   Dose of Adenosine (mg):  n/a Dose of Lexiscan: 0.4 mg  Dose of Atropine (mg): n/a Dose of Dobutamine: n/a mcg/kg/min (at max HR)  Stress Test Technologist: Smiley Houseman, CMA-N  Nuclear Technologist:  Doyne Keel, CNMT     Rest Procedure:  Myocardial perfusion imaging was performed at rest 45 minutes following the intravenous administration of Technetium  63m Sestamibi.  Rest ECG: NSR - Normal EKG  Stress Procedure:  The patient received IV Lexiscan 0.4 mg over 15-seconds with concurrent low level exercise and then Technetium 41m Sestamibi was injected at 30-seconds while the patient continued walking one more minute.  Quantitative spect images were obtained after a 45-minute delay.  Stress ECG: No significant change from baseline ECG  QPS Raw Data Images:  Patient motion noted. Stress Images:  Normal homogeneous uptake in all areas of the myocardium. Rest Images:  Normal homogeneous uptake in all areas of the myocardium. Subtraction (SDS):  Normal Transient Ischemic Dilatation (Normal <1.22):  1.01 Lung/Heart Ratio (Normal <0.45):  0.26  Quantitative Gated Spect Images QGS EDV:  112 ml QGS ESV:  46 ml  Impression Exercise Capacity:  Lexiscan with low level exercise. BP Response:  Hypertensive blood pressure response. Clinical Symptoms:  There is dyspnea. ECG Impression:  No significant ST segment change suggestive of ischemia. Comparison with Prior Nuclear Study: No previous nuclear study to compare with.  Overall Impression:  Normal stress nuclear study.  LV Ejection Fraction: 59%.  LV Wall Motion:  NL LV Function; NL Wall Motion  [name]  Normal EF, no evidence for ischemia or infarction. BP is still high.   Marca Ancona 02/06/2013 12:43 PM

## 2013-02-06 ENCOUNTER — Telehealth: Payer: Self-pay | Admitting: *Deleted

## 2013-02-06 ENCOUNTER — Other Ambulatory Visit: Payer: Self-pay | Admitting: *Deleted

## 2013-02-06 DIAGNOSIS — Z8669 Personal history of other diseases of the nervous system and sense organs: Secondary | ICD-10-CM

## 2013-02-06 NOTE — Telephone Encounter (Signed)
Transitional care management Admit date: 02/02/2013 Discharge date:02/03/2013  Discharge diagnoses:  CP  Hx of hyperthyroidsim  htn  Impaired glucose tolerance  HLD   OSA  migraines  Discharge condition: stable and improved, will discharge jome amd advise him to follow with PCP.  Cardiology and neurology as instructed.   Talked with wife . Pt was sleeping.  Wife states he has been doing fine, with no more chest pain. Appetite is good and pt is compliant with medication.  Pt is knowledgeable of condition and treatment,    F/u hospital visit is scheduled for Tuesday, 4-8- with dr Amador Cunas

## 2013-02-06 NOTE — Progress Notes (Signed)
Pt.notified

## 2013-02-06 NOTE — Addendum Note (Signed)
Addended by: Milinda Antis on: 02/06/2013 08:39 AM   Modules accepted: Orders

## 2013-02-11 ENCOUNTER — Encounter: Payer: Self-pay | Admitting: Internal Medicine

## 2013-02-11 ENCOUNTER — Ambulatory Visit (INDEPENDENT_AMBULATORY_CARE_PROVIDER_SITE_OTHER): Payer: Medicare Other | Admitting: Internal Medicine

## 2013-02-11 VITALS — BP 132/94 | HR 66 | Temp 98.1°F | Resp 18 | Wt 261.0 lb

## 2013-02-11 DIAGNOSIS — R7302 Impaired glucose tolerance (oral): Secondary | ICD-10-CM

## 2013-02-11 DIAGNOSIS — R7309 Other abnormal glucose: Secondary | ICD-10-CM

## 2013-02-11 DIAGNOSIS — I200 Unstable angina: Secondary | ICD-10-CM

## 2013-02-11 DIAGNOSIS — I10 Essential (primary) hypertension: Secondary | ICD-10-CM

## 2013-02-11 NOTE — Progress Notes (Signed)
Subjective:    Patient ID: Gordon Phillips, male    DOB: 06/23/1946, 67 y.o.   MRN: [id number]  HPI  67 year old patient who is seen today following a recent hospital discharge.  He was admitted for evaluation of severe substernal chest pain after a use and his wife Relpax for suspected migraine headache.  He was evaluated by cardiology and since his discharge has had a normal nuclear stress test performed. He's had no recurrent headaches and no recurrent chest pain;  in general feels quite well. He states that he has purchased a home exercise equipment and is anxious to be cleared for regular exercise program. His blood pressure medications were up titrated.  Hospital records reviewed. Patient was discharged on Topamax which she has not taken for migraine prophylaxis.  Past Medical History  Diagnosis Date  . HYPERTHYROIDISM 01/25/2010  . HYPERTENSION 01/25/2010  . ALLERGIC RHINITIS 01/25/2010  . Vitiligo   . Obesity   . Pericarditis 1980's  . 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  . Impaired glucose tolerance   . OSA (obstructive sleep apnea)     Uses CPAP nightly  . Migraines     History   Social History  . Marital Status: Married    Spouse Name: N/A    Number of Children: N/A  . Years of Education: N/A   Occupational History  . Not on file.   Social History Main Topics  . Smoking status: Former Games developer  . Smokeless tobacco: Not on file     Comment: Smoked for 8 yrs, quit remotely  . Alcohol Use: Yes     Comment: occasional - max 2x/week  . Drug Use: No  . Sexually Active: Not on file   Other Topics Concern  . Not on file   Social History Narrative  . No narrative on file    Past Surgical History  Procedure Laterality Date  . Hernia repair      umbilical  . Lumbar laminectomy    . Inguinal hernia repair      Family History  Problem Relation Age of Onset  . Thyroid disease Sister     goiter  . Stroke Father     Passed away in his 50s  .  Sudden death Brother     ? Drug use, no autopsy  . Throat cancer Brother     Allergies  Allergen Reactions  . Relpax (Eletriptan)     Chest pain, hypertension, sweating    Current Outpatient Prescriptions on File Prior to Visit  Medication Sig Dispense Refill  . aspirin EC 81 MG tablet Take 1 tablet (81 mg total) by mouth daily.      . fluticasone (FLONASE) 50 MCG/ACT nasal spray Place 2 sprays into the nose daily.  16 g  2  . metoprolol succinate (TOPROL-XL) 100 MG 24 hr tablet Take 1 tablet (100 mg total) by mouth daily.  30 tablet  1  . omeprazole (PRILOSEC) 40 MG capsule Take 1 capsule (40 mg total) by mouth daily.  30 capsule  1  . pravastatin (PRAVACHOL) 40 MG tablet Take 1 tablet (40 mg total) by mouth daily.  30 tablet  1  . topiramate (TOPAMAX) 25 MG tablet Take 1 tablet by mouth daily at bedtime  X 1 week; then start taking medication BID  60 tablet  1  . sildenafil (VIAGRA) 100 MG tablet Take 1 tablet (100 mg total) by mouth as needed for erectile dysfunction.  20 tablet  1   No current facility-administered medications on file prior to visit.    BP 132/94  Pulse 66  Temp(Src) 98.1 F (36.7 C) (Oral)  Resp 18  Wt 261 lb (118.389 kg)  BMI 33.5 kg/m2  SpO2 96%       Review of Systems  Constitutional: Negative for fever, chills, appetite change and fatigue.  HENT: Negative for hearing loss, ear pain, congestion, sore throat, trouble swallowing, neck stiffness, dental problem, voice change and tinnitus.   Eyes: Negative for pain, discharge and visual disturbance.  Respiratory: Negative for cough, chest tightness, wheezing and stridor.   Cardiovascular: Negative for chest pain, palpitations and leg swelling.  Gastrointestinal: Negative for nausea, vomiting, abdominal pain, diarrhea, constipation, blood in stool and abdominal distention.  Genitourinary: Negative for urgency, hematuria, flank pain, discharge, difficulty urinating and genital sores.  Musculoskeletal:  Negative for myalgias, back pain, joint swelling, arthralgias and gait problem.  Skin: Negative for rash.  Neurological: Negative for dizziness, syncope, speech difficulty, weakness, numbness and headaches.  Hematological: Negative for adenopathy. Does not bruise/bleed easily.  Psychiatric/Behavioral: Negative for behavioral problems and dysphoric mood. The patient is not nervous/anxious.        Objective:   Physical Exam  Constitutional: He is oriented to person, place, and time. He appears well-developed.  Blood pressure 1:30 or 8 he  HENT:  Head: Normocephalic.  Right Ear: External ear normal.  Left Ear: External ear normal.  Eyes: Conjunctivae and EOM are normal.  Neck: Normal range of motion.  Cardiovascular: Normal rate and normal heart sounds.   Pulmonary/Chest: Breath sounds normal.  Abdominal: Bowel sounds are normal.  Musculoskeletal: Normal range of motion. He exhibits no edema and no tenderness.  Neurological: He is alert and oriented to person, place, and time.  Psychiatric: He has a normal mood and affect. His behavior is normal.          Assessment & Plan:   Chest pain syndrome resolved. Status post normal nuclear stress test Hypertension stable History of headaches. These have not recurred options discussed we'll hold the Topamax therapy at this time. Will call if he develops any recurrent headaches Obesity. Weight loss exercise regimen encouraged  Recheck 6 months

## 2013-02-11 NOTE — Patient Instructions (Signed)

## 2013-02-20 ENCOUNTER — Ambulatory Visit (INDEPENDENT_AMBULATORY_CARE_PROVIDER_SITE_OTHER): Payer: Medicare Other | Admitting: Physician Assistant

## 2013-02-20 ENCOUNTER — Encounter: Payer: Self-pay | Admitting: Physician Assistant

## 2013-02-20 VITALS — BP 122/74 | HR 67 | Ht 74.0 in | Wt 268.2 lb

## 2013-02-20 DIAGNOSIS — E785 Hyperlipidemia, unspecified: Secondary | ICD-10-CM

## 2013-02-20 DIAGNOSIS — Z8673 Personal history of transient ischemic attack (TIA), and cerebral infarction without residual deficits: Secondary | ICD-10-CM

## 2013-02-20 DIAGNOSIS — I10 Essential (primary) hypertension: Secondary | ICD-10-CM

## 2013-02-20 DIAGNOSIS — R079 Chest pain, unspecified: Secondary | ICD-10-CM

## 2013-02-20 NOTE — Patient Instructions (Addendum)
Your physician wants you to follow-up in: 6 MONTHS WITH DR. Shirlee Latch. You will receive a reminder letter in the mail two months in advance. If you don't receive a letter, please call our office to schedule the follow-up appointment.   FASTING LIPID AND LIVER PANEL TO BE IN 6-8 WEEKS WITH PCP 04/11/13 @ 11 AM NOTHING TO EAT OR DRINK AFTER MIDNIGHT THE NIGHT BEFORE, YOU CAN TAKE MORNING MEDS WITH WATER MORNING OF TEST

## 2013-02-20 NOTE — Progress Notes (Signed)
[address], [address] [place], [place]  [address] Phone: [phone] Fax:  [phone]  Date:  02/20/2013   ID:  Gordon Phillips, DOB 04-12-1946, MRN [id number]  PCP:  Rogelia Boga, MD  Primary Cardiologist:  Dr. Marca Ancona     History of Present Illness: Gordon Phillips is a 67 y.o. male who returns for f/u after a recent admission to the hospital 3/30-3/31 with chest pain after taking Relpax for migraine HA.  He has a hx of HTN, OSA, remote pericarditis and migraine HAs.  He presented with CP shortly after taking the Relpax (triptan).  BP was 240/140.  Of note, he had an old lacunar infarct in L basal ganglia on Head CT (patient was unaware of prior stroke).  Echo 02/03/13:  Mild LVH, mild focal basal septal hypertrophy, EF 50-55%, Gr 1 DD, mild LAE.  CEs remained negative.  He was placed on a statin.  He noted facial numbness.  In light of his remote stroke on CT and migraine HAs, OP neurology consultation was recommended.  OP myoview was recommended as well.  Lexiscan Myoview 02/06/13:  EF 59%, no ischemia or scar.  Since d/c, he is doing well.  The patient denies chest pain, shortness of breath, syncope, orthopnea, PND or significant pedal edema.   Labs (3/14):  K 3.8, Cr 1.17., ALT 17, LDL 137, Hgb 13.5, TSH 1.769, A1c 6.1  Wt Readings from Last 3 Encounters:  02/20/13 268 lb 3.2 oz (121.655 kg)  02/11/13 261 lb (118.389 kg)  02/05/13 263 lb (119.296 kg)     Past Medical History  Diagnosis Date  . HYPERTHYROIDISM 01/25/2010  . HYPERTENSION 01/25/2010  . ALLERGIC RHINITIS 01/25/2010  . Vitiligo   . Obesity   . Pericarditis 1980's  . 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  . Impaired glucose tolerance   . OSA (obstructive sleep apnea)     Uses CPAP nightly  . Migraines   . Hx of echocardiogram     a.  Echo 02/03/13:  Mild LVH, mild focal basal septal hypertrophy, EF 50-55%, Gr 1 DD, mild LAE  . Hx of cardiovascular stress test     a. Lexiscan  Myoview 02/06/13:  EF 59%, no ischemia or scar    Current Outpatient Prescriptions  Medication Sig Dispense Refill  . aspirin EC 81 MG tablet Take 1 tablet (81 mg total) by mouth daily.      . fluticasone (FLONASE) 50 MCG/ACT nasal spray Place 2 sprays into the nose daily.  16 g  2  . losartan (COZAAR) 50 MG tablet Take 100 mg by mouth daily.      . metoprolol succinate (TOPROL-XL) 100 MG 24 hr tablet Take 1 tablet (100 mg total) by mouth daily.  30 tablet  1  . omeprazole (PRILOSEC) 40 MG capsule Take 1 capsule (40 mg total) by mouth daily.  30 capsule  1  . pravastatin (PRAVACHOL) 40 MG tablet Take 1 tablet (40 mg total) by mouth daily.  30 tablet  1  . sildenafil (VIAGRA) 100 MG tablet Take 1 tablet (100 mg total) by mouth as needed for erectile dysfunction.  20 tablet  1   No current facility-administered medications for this visit.    Allergies:    Allergies  Allergen Reactions  . Relpax (Eletriptan)     Chest pain, hypertension, sweating    Social History:  The patient  reports that he has quit smoking. He does  not have any smokeless tobacco history on file. He reports that  drinks alcohol. He reports that he does not use illicit drugs.   ROS:  Please see the history of present illness.    All other systems reviewed and negative.   PHYSICAL EXAM: VS:  BP 122/74  Pulse 67  Ht 6\' 2"  (1.88 m)  Wt 268 lb 3.2 oz (121.655 kg)  BMI 34.42 kg/m2 Well nourished, well developed, in no acute distress HEENT: normal Neck: no JVD Cardiac:  normal S1, S2; RRR; 1/6 systolic murmur LUSB Lungs:  clear to auscultation bilaterally, no wheezing, rhonchi or rales Abd: soft, nontender, no hepatomegaly Ext: no edema Skin: warm and dry Neuro:  CNs 2-12 intact, no focal abnormalities noted  EKG:  NSR, HR 67, normal axis, first degree AV block, PR interval 230 ms, nonspecific ST-T wave changes, no change from prior tracing     ASSESSMENT AND PLAN:  1. Chest Pain:  No recurrence. This was  likely related to triptan use for migraine HA. He knows to avoid this. Recent Myoview low risk. Continue current therapy. 2. Hypertension:  Controlled.  Continue current therapy.  3. Hyperlipidemia:  Continue statin.  Check Lipids and LFTs in 6 weeks.   4. Prior Stroke:  He has f/u with neurology arranged. 5. Disposition:  F/u with Dr. Marca Ancona in 6 mos.   Luna Glasgow, PA-C  10:58 AM 02/20/2013

## 2013-02-25 ENCOUNTER — Telehealth: Payer: Self-pay | Admitting: Internal Medicine

## 2013-02-25 NOTE — Telephone Encounter (Addendum)
Pt needs to change his pharm info to [facility] ([facility])  [phone] Refill All 90 day supply omeprazole (PRILOSEC) 40 MG capsule metoprolol succinate (TOPROL-XL) 100 MG 24 hr tablet losartan (COZAAR) 100 MG tablet (pls note change from before) pravastatin (PRAVACHOL) 40 MG tablet

## 2013-02-26 NOTE — Telephone Encounter (Signed)
Left message on voicemail to call office.  

## 2013-02-27 MED ORDER — METOPROLOL SUCCINATE ER 100 MG PO TB24: 100.0000 mg | ORAL_TABLET | Freq: Every day | ORAL | Status: DC

## 2013-02-27 MED ORDER — OMEPRAZOLE 40 MG PO CPDR: 40.0000 mg | DELAYED_RELEASE_CAPSULE | Freq: Every day | ORAL | Status: DC

## 2013-02-27 MED ORDER — LOSARTAN POTASSIUM 50 MG PO TABS: 100.0000 mg | ORAL_TABLET | Freq: Every day | ORAL | Status: DC

## 2013-02-27 MED ORDER — PRAVASTATIN SODIUM 40 MG PO TABS: 40.0000 mg | ORAL_TABLET | Freq: Every day | ORAL | Status: DC

## 2013-02-27 NOTE — Telephone Encounter (Signed)
Spoke to pt regarding if on Losartan or not? Pt stated yes he is taking 100 mg daily. Told pt okay just wanted to make sure will send Rx's to [facility]. Pt verbalized understanding. Rx's sent

## 2013-02-27 NOTE — Telephone Encounter (Signed)
Left message with pt's wife to call me.

## 2013-02-27 NOTE — Telephone Encounter (Signed)
Pt returning your call. Pls call at home.

## 2013-03-03 ENCOUNTER — Other Ambulatory Visit: Payer: Self-pay | Admitting: *Deleted

## 2013-03-03 MED ORDER — PRAVASTATIN SODIUM 40 MG PO TABS: 40.0000 mg | ORAL_TABLET | Freq: Every day | ORAL | Status: DC

## 2013-03-03 MED ORDER — METOPROLOL SUCCINATE ER 100 MG PO TB24: 100.0000 mg | ORAL_TABLET | Freq: Every day | ORAL | Status: DC

## 2013-03-03 MED ORDER — OMEPRAZOLE 40 MG PO CPDR: 40.0000 mg | DELAYED_RELEASE_CAPSULE | Freq: Every day | ORAL | Status: DC

## 2013-03-03 MED ORDER — LOSARTAN POTASSIUM 50 MG PO TABS: 100.0000 mg | ORAL_TABLET | Freq: Every day | ORAL | Status: DC

## 2013-03-26 ENCOUNTER — Ambulatory Visit: Payer: Self-pay | Admitting: Neurology

## 2013-04-04 ENCOUNTER — Encounter: Payer: Self-pay | Admitting: Internal Medicine

## 2013-04-04 ENCOUNTER — Ambulatory Visit (INDEPENDENT_AMBULATORY_CARE_PROVIDER_SITE_OTHER): Payer: Medicare Other | Admitting: Internal Medicine

## 2013-04-04 VITALS — BP 120/86 | HR 65 | Temp 98.0°F | Resp 20 | Wt 267.0 lb

## 2013-04-04 DIAGNOSIS — M549 Dorsalgia, unspecified: Secondary | ICD-10-CM

## 2013-04-04 DIAGNOSIS — G4700 Insomnia, unspecified: Secondary | ICD-10-CM

## 2013-04-04 DIAGNOSIS — I10 Essential (primary) hypertension: Secondary | ICD-10-CM

## 2013-04-04 MED ORDER — ZOLPIDEM TARTRATE 5 MG PO TABS: 5.0000 mg | ORAL_TABLET | Freq: Every evening | ORAL | Status: DC | PRN

## 2013-04-04 NOTE — Progress Notes (Signed)
Subjective:    Patient ID: Gordon Phillips, male    DOB: 05-10-1946, 67 y.o.   MRN: [id number]  HPI  67 year old patient who is seen today with a number of concerns. His chief complaint is low back pain that has been a bit of a problem since he has started working. He is spending more time on his feet. He complains of the inability to lose weight in spite of the a low-calorie diet. He also complains of insomnia. He is treated hypertension which has been stable. He has dyslipidemia and also a history of impaired glucose tolerance.  Past Medical History  Diagnosis Date  . HYPERTHYROIDISM 01/25/2010  . HYPERTENSION 01/25/2010  . ALLERGIC RHINITIS 01/25/2010  . Vitiligo   . Obesity   . Pericarditis 1980's  . 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  . Impaired glucose tolerance   . OSA (obstructive sleep apnea)     Uses CPAP nightly  . Migraines   . Hx of echocardiogram     a.  Echo 02/03/13:  Mild LVH, mild focal basal septal hypertrophy, EF 50-55%, Gr 1 DD, mild LAE  . Hx of cardiovascular stress test     a. Lexiscan Myoview 02/06/13:  EF 59%, no ischemia or scar    History   Social History  . Marital Status: Married    Spouse Name: N/A    Number of Children: N/A  . Years of Education: N/A   Occupational History  . Not on file.   Social History Main Topics  . Smoking status: Former Games developer  . Smokeless tobacco: Not on file     Comment: Smoked for 8 yrs, quit remotely  . Alcohol Use: Yes     Comment: occasional - max 2x/week  . Drug Use: No  . Sexually Active: Not on file   Other Topics Concern  . Not on file   Social History Narrative  . No narrative on file    Past Surgical History  Procedure Laterality Date  . Hernia repair      umbilical  . Lumbar laminectomy    . Inguinal hernia repair      Family History  Problem Relation Age of Onset  . Thyroid disease Sister     goiter  . Stroke Father     Passed away in his 73s  . Sudden death Brother     ?  Drug use, no autopsy  . Throat cancer Brother     Allergies  Allergen Reactions  . Relpax (Eletriptan)     Chest pain, hypertension, sweating    Current Outpatient Prescriptions on File Prior to Visit  Medication Sig Dispense Refill  . aspirin EC 81 MG tablet Take 1 tablet (81 mg total) by mouth daily.      . fluticasone (FLONASE) 50 MCG/ACT nasal spray Place 2 sprays into the nose daily.  16 g  2  . losartan (COZAAR) 50 MG tablet Take 2 tablets (100 mg total) by mouth daily.  180 tablet  1  . metoprolol succinate (TOPROL-XL) 100 MG 24 hr tablet Take 1 tablet (100 mg total) by mouth daily.  90 tablet  1  . omeprazole (PRILOSEC) 40 MG capsule Take 1 capsule (40 mg total) by mouth daily.  90 capsule  1  . pravastatin (PRAVACHOL) 40 MG tablet Take 1 tablet (40 mg total) by mouth daily.  90 tablet  1  . sildenafil (VIAGRA) 100 MG tablet Take 1 tablet (100  mg total) by mouth as needed for erectile dysfunction.  20 tablet  1   No current facility-administered medications on file prior to visit.    BP 120/86  Pulse 65  Temp(Src) 98 F (36.7 C) (Oral)  Resp 20  Wt 267 lb (121.11 kg)  BMI 34.27 kg/m2  SpO2 98%       Review of Systems  Constitutional: Negative for fever, chills, appetite change and fatigue.  HENT: Negative for hearing loss, ear pain, congestion, sore throat, trouble swallowing, neck stiffness, dental problem, voice change and tinnitus.   Eyes: Negative for pain, discharge and visual disturbance.  Respiratory: Negative for cough, chest tightness, wheezing and stridor.   Cardiovascular: Negative for chest pain, palpitations and leg swelling.  Gastrointestinal: Negative for nausea, vomiting, abdominal pain, diarrhea, constipation, blood in stool and abdominal distention.  Genitourinary: Negative for urgency, hematuria, flank pain, discharge, difficulty urinating and genital sores.  Musculoskeletal: Negative for myalgias, back pain, joint swelling, arthralgias and gait  problem.  Skin: Negative for rash.  Neurological: Negative for dizziness, syncope, speech difficulty, weakness, numbness and headaches.  Hematological: Negative for adenopathy. Does not bruise/bleed easily.  Psychiatric/Behavioral: Negative for behavioral problems and dysphoric mood. The patient is not nervous/anxious.        Objective:   Physical Exam  Vitals reviewed. Constitutional: He is oriented to person, place, and time. He appears well-developed.  HENT:  Head: Normocephalic.  Right Ear: External ear normal.  Left Ear: External ear normal.  Eyes: Conjunctivae and EOM are normal.  Neck: Normal range of motion.  Cardiovascular: Normal rate and normal heart sounds.   Pulmonary/Chest: Breath sounds normal.  Abdominal: Bowel sounds are normal.  Musculoskeletal: Normal range of motion. He exhibits no edema and no tenderness.  Neurological: He is alert and oriented to person, place, and time.  Psychiatric: He has a normal mood and affect. His behavior is normal.          Assessment & Plan:   Low back pain. We'll treat symptomatically. Was given information concerning low back pain prevention as well as stretching exercises Insomnia. Sleep hygiene issues discussed. Information dispensed Hypertension stable  Recheck 3 months Check hemoglobin A1c at that time

## 2013-04-04 NOTE — Patient Instructions (Addendum)
Limit your sodium (Salt) intake  You need to lose weight.  Consider a lower calorie diet and regular exercise.    It is important that you exercise regularly, at least 20 minutes 3 to 4 times per week.  If you develop chest pain or shortness of breath seek  medical attention.Back Injury Prevention Back injuries can be extremely painful and difficult to heal. After having one back injury, you are much more likely to experience another later on. It is important to learn how to avoid injuring or re-injuring your back. The following tips can help you to prevent a back injury. PHYSICAL FITNESS  Exercise regularly and try to develop good tone in your abdominal muscles. Your abdominal muscles provide a lot of the support needed by your back.  Do aerobic exercises (walking, jogging, biking, swimming) regularly.  Do exercises that increase balance and strength (tai chi, yoga) regularly. This can decrease your risk of falling and injuring your back.  Stretch before and after exercising.  Maintain a healthy weight. The more you weigh, the more stress is placed on your back. For every pound of weight, 10 times that amount of pressure is placed on the back. DIET  Talk to your caregiver about how much calcium and vitamin D you need per day. These nutrients help to prevent weakening of the bones (osteoporosis). Osteoporosis can cause broken (fractured) bones that lead to back pain.  Include good sources of calcium in your diet, such as dairy products, green, leafy vegetables, and products with calcium added (fortified).  Include good sources of vitamin D in your diet, such as milk and foods that are fortified with vitamin D.  Consider taking a nutritional supplement or a multivitamin if needed.  Stop smoking if you smoke. POSTURE  Sit and stand up straight. Avoid leaning forward when you sit or hunching over when you stand.  Choose chairs with good low back (lumbar) support.  If you work at a  desk, sit close to your work so you do not need to lean over. Keep your chin tucked in. Keep your neck drawn back and elbows bent at a right angle. Your arms should look like the letter "L."  Sit high and close to the steering wheel when you drive. Add a lumbar support to your car seat if needed.  Avoid sitting or standing in one position for too long. Take breaks to get up, stretch, and walk around at least once every hour. Take breaks if you are driving for long periods of time.  Sleep on your side with your knees slightly bent, or sleep on your back with a pillow under your knees. Do not sleep on your stomach. LIFTING, TWISTING, AND REACHING  Avoid heavy lifting, especially repetitive lifting. If you must do heavy lifting:  Stretch before lifting.  Work slowly.  Rest between lifts.  Use carts and dollies to move objects when possible.  Make several small trips instead of carrying 1 heavy load.  Ask for help when you need it.  Ask for help when moving big, awkward objects.  Follow these steps when lifting:  Stand with your feet shoulder-width apart.  Get as close to the object as you can. Do not try to pick up heavy objects that are far from your body.  Use handles or lifting straps if they are available.  Bend at your knees. Squat down, but keep your heels off the floor.  Keep your shoulders pulled back, your chin tucked in, and your  back straight.  Lift the object slowly, tightening the muscles in your legs, abdomen, and buttocks. Keep the object as close to the center of your body as possible.  When you put a load down, use these same guidelines in reverse.  Do not:  Lift the object above your waist.  Twist at the waist while lifting or carrying a load. Move your feet if you need to turn, not your waist.  Bend over without bending at your knees.  Avoid reaching over your head, across a table, or for an object on a high surface. OTHER TIPS  Avoid wet floors and  keep sidewalks clear of ice to prevent falls.  Do not sleep on a mattress that is too soft or too hard.  Keep items that are used frequently within easy reach.  Put heavier objects on shelves at waist level and lighter objects on lower or higher shelves.  Find ways to decrease your stress, such as exercise, massage, or relaxation techniques. Stress can build up in your muscles. Tense muscles are more vulnerable to injury.  Seek treatment for depression or anxiety if needed. These conditions can increase your risk of developing back pain. SEEK MEDICAL CARE IF:  You injure your back.  You have questions about diet, exercise, or other ways to prevent back injuries. MAKE SURE YOU:  Understand these instructions.  Will watch your condition.  Will get help right away if you are not doing well or get worse. Document Released: 11/30/2004 Document Revised: 01/15/2012 Document Reviewed: 12/04/2011 [facility] Patient Information 2014 [place], [place]. Back Exercises Back exercises help treat and prevent back injuries. The goal of back exercises is to increase the strength of your abdominal and back muscles and the flexibility of your back. These exercises should be started when you no longer have back pain. Back exercises include:  Pelvic Tilt. Lie on your back with your knees bent. Tilt your pelvis until the lower part of your back is against the floor. Hold this position 5 to 10 sec and repeat 5 to 10 times.  Knee to Chest. Pull first 1 knee up against your chest and hold for 20 to 30 seconds, repeat this with the other knee, and then both knees. This may be done with the other leg straight or bent, whichever feels better.  Sit-Ups or Curl-Ups. Bend your knees 90 degrees. Start with tilting your pelvis, and do a partial, slow sit-up, lifting your trunk only 30 to 45 degrees off the floor. Take at least 2 to 3 seconds for each sit-up. Do not do sit-ups with your knees out straight. If partial  sit-ups are difficult, simply do the above but with only tightening your abdominal muscles and holding it as directed.  Hip-Lift. Lie on your back with your knees flexed 90 degrees. Push down with your feet and shoulders as you raise your hips a couple inches off the floor; hold for 10 seconds, repeat 5 to 10 times.  Back arches. Lie on your stomach, propping yourself up on bent elbows. Slowly press on your hands, causing an arch in your low back. Repeat 3 to 5 times. Any initial stiffness and discomfort should lessen with repetition over time.  Shoulder-Lifts. Lie face down with arms beside your body. Keep hips and torso pressed to floor as you slowly lift your head and shoulders off the floor. Do not overdo your exercises, especially in the beginning. Exercises may cause you some mild back discomfort which lasts for a few minutes; however,  if the pain is more severe, or lasts for more than 15 minutes, do not continue exercises until you see your caregiver. Improvement with exercise therapy for back problems is slow.  See your caregivers for assistance with developing a proper back exercise program. Document Released: 11/30/2004 Document Revised: 01/15/2012 Document Reviewed: 08/24/2011 [facility] Patient Information 2014 [place], [place]. Insomnia Insomnia is frequent trouble falling and/or staying asleep. Insomnia can be a long term problem or a short term problem. Both are common. Insomnia can be a short term problem when the wakefulness is related to a certain stress or worry. Long term insomnia is often related to ongoing stress during waking hours and/or poor sleeping habits. Overtime, sleep deprivation itself can make the problem worse. Every little thing feels more severe because you are overtired and your ability to cope is decreased. CAUSES   Stress, anxiety, and depression.  Poor sleeping habits.  Distractions such as TV in the bedroom.  Naps close to bedtime.  Engaging in emotionally  charged conversations before bed.  Technical reading before sleep.  Alcohol and other sedatives. They may make the problem worse. They can hurt normal sleep patterns and normal dream activity.  Stimulants such as caffeine for several hours prior to bedtime.  Pain syndromes and shortness of breath can cause insomnia.  Exercise late at night.  Changing time zones may cause sleeping problems (jet lag). It is sometimes helpful to have someone observe your sleeping patterns. They should look for periods of not breathing during the night (sleep apnea). They should also look to see how long those periods last. If you live alone or observers are uncertain, you can also be observed at a sleep clinic where your sleep patterns will be professionally monitored. Sleep apnea requires a checkup and treatment. Give your caregivers your medical history. Give your caregivers observations your family has made about your sleep.  SYMPTOMS   Not feeling rested in the morning.  Anxiety and restlessness at bedtime.  Difficulty falling and staying asleep. TREATMENT   Your caregiver may prescribe treatment for an underlying medical disorders. Your caregiver can give advice or help if you are using alcohol or other drugs for self-medication. Treatment of underlying problems will usually eliminate insomnia problems.  Medications can be prescribed for short time use. They are generally not recommended for lengthy use.  Over-the-counter sleep medicines are not recommended for lengthy use. They can be habit forming.  You can promote easier sleeping by making lifestyle changes such as:  Using relaxation techniques that help with breathing and reduce muscle tension.  Exercising earlier in the day.  Changing your diet and the time of your last meal. No night time snacks.  Establish a regular time to go to bed.  Counseling can help with stressful problems and worry.  Soothing music and white noise may be  helpful if there are background noises you cannot remove.  Stop tedious detailed work at least one hour before bedtime. HOME CARE INSTRUCTIONS   Keep a diary. Inform your caregiver about your progress. This includes any medication side effects. See your caregiver regularly. Take note of:  Times when you are asleep.  Times when you are awake during the night.  The quality of your sleep.  How you feel the next day. This information will help your caregiver care for you.  Get out of bed if you are still awake after 15 minutes. Read or do some quiet activity. Keep the lights down. Wait until you feel sleepy and  go back to bed.  Keep regular sleeping and waking hours. Avoid naps.  Exercise regularly.  Avoid distractions at bedtime. Distractions include watching television or engaging in any intense or detailed activity like attempting to balance the household checkbook.  Develop a bedtime ritual. Keep a familiar routine of bathing, brushing your teeth, climbing into bed at the same time each night, listening to soothing music. Routines increase the success of falling to sleep faster.  Use relaxation techniques. This can be using breathing and muscle tension release routines. It can also include visualizing peaceful scenes. You can also help control troubling or intruding thoughts by keeping your mind occupied with boring or repetitive thoughts like the old concept of counting sheep. You can make it more creative like imagining planting one beautiful flower after another in your backyard garden.  During your day, work to eliminate stress. When this is not possible use some of the previous suggestions to help reduce the anxiety that accompanies stressful situations. MAKE SURE YOU:   Understand these instructions.  Will watch your condition.  Will get help right away if you are not doing well or get worse. Document Released: 10/20/2000 Document Revised: 01/15/2012 Document Reviewed:  11/20/2007 [facility] Patient Information 2014 [place], [place].

## 2013-04-11 ENCOUNTER — Other Ambulatory Visit: Payer: Medicare Other

## 2013-04-25 ENCOUNTER — Other Ambulatory Visit (INDEPENDENT_AMBULATORY_CARE_PROVIDER_SITE_OTHER): Payer: Medicare Other

## 2013-04-25 ENCOUNTER — Other Ambulatory Visit: Payer: Medicare Other

## 2013-04-25 DIAGNOSIS — E785 Hyperlipidemia, unspecified: Secondary | ICD-10-CM

## 2013-04-25 LAB — LIPID PANEL
Cholesterol: 157 mg/dL (ref 0–200)
HDL: 54.2 mg/dL (ref >39.00)
LDL Cholesterol: 90 mg/dL (ref 0–99)
Total CHOL/HDL Ratio: 3
Triglycerides: 65 mg/dL (ref 0.0–149.0)
VLDL: 13 mg/dL (ref 0.0–40.0)

## 2013-04-25 LAB — HEPATIC FUNCTION PANEL
ALT: 22 U/L (ref 0–53)
AST: 30 U/L (ref 0–37)
Albumin: 4.2 g/dL (ref 3.5–5.2)
Alkaline Phosphatase: 48 U/L (ref 39–117)
Bilirubin, Direct: 0.1 mg/dL (ref 0.0–0.3)
Total Bilirubin: 1.2 mg/dL (ref 0.3–1.2)
Total Protein: 8 g/dL (ref 6.0–8.3)

## 2013-04-25 NOTE — Progress Notes (Signed)
Quick Note:  Preliminary report reviewed by triage nurse and sent to MD desk. ______ 

## 2013-04-28 ENCOUNTER — Telehealth: Payer: Self-pay | Admitting: *Deleted

## 2013-04-28 NOTE — Telephone Encounter (Signed)
Message copied by Tarri Fuller on Mon Apr 28, 2013  9:03 AM ------      Message from: [place], [place] T      Created: Fri Apr 25, 2013  5:07 PM       Lipids at goal.      LFTs ok.      Continue with current treatment plan.      Tereso Newcomer, PA-C  5:11 PM 02/05/2013 ------

## 2013-04-28 NOTE — Telephone Encounter (Signed)
lmom labs ok, stay on current regimen, no changes to be made

## 2013-05-19 ENCOUNTER — Telehealth: Payer: Self-pay | Admitting: Internal Medicine

## 2013-05-19 NOTE — Telephone Encounter (Signed)
Please see message below and advise if pt can switch to Losartan 100 mg daily?

## 2013-05-19 NOTE — Telephone Encounter (Signed)
Patient and I have gone round and round with his rx insurance. They will not pay for the losartan 50mg  BID. They want him to switch to a 100mg  dose daily. He is using [facility] mail order.  Currently, patient is taking 50mg  once daily, because they only gave him enough for 2 wks. If the patient can switch to QD, please send in #90 for 90 day rx to his mail order. Thank you.

## 2013-05-19 NOTE — Telephone Encounter (Signed)
Okay for losartan 100 mg #90 one daily refill times 6

## 2013-05-20 MED ORDER — LOSARTAN POTASSIUM 100 MG PO TABS: 100.0000 mg | ORAL_TABLET | Freq: Every day | ORAL | Status: DC

## 2013-05-20 NOTE — Telephone Encounter (Signed)
Spoke to pt's wife told her medication Losartan was changed to 100 mg and new Rx sent to [facility]. Mrs. Cihlar verbalized understanding and stated she will tell her husband.

## 2013-06-23 ENCOUNTER — Encounter: Payer: Self-pay | Admitting: Internal Medicine

## 2013-06-23 ENCOUNTER — Ambulatory Visit (INDEPENDENT_AMBULATORY_CARE_PROVIDER_SITE_OTHER): Payer: Medicare Other | Admitting: Internal Medicine

## 2013-06-23 VITALS — BP 138/90 | HR 74 | Temp 97.9°F | Resp 20 | Wt 267.0 lb

## 2013-06-23 DIAGNOSIS — I200 Unstable angina: Secondary | ICD-10-CM

## 2013-06-23 DIAGNOSIS — R7309 Other abnormal glucose: Secondary | ICD-10-CM

## 2013-06-23 DIAGNOSIS — I10 Essential (primary) hypertension: Secondary | ICD-10-CM

## 2013-06-23 DIAGNOSIS — R7302 Impaired glucose tolerance (oral): Secondary | ICD-10-CM

## 2013-06-23 LAB — GLUCOSE, POCT (MANUAL RESULT ENTRY): POC Glucose: 126 mg/dL — AB (ref 70–99)

## 2013-06-23 MED ORDER — ZOLPIDEM TARTRATE 5 MG PO TABS: 5.0000 mg | ORAL_TABLET | Freq: Every evening | ORAL | Status: DC | PRN

## 2013-06-23 NOTE — Progress Notes (Signed)
Subjective:    Patient ID: Gordon Phillips, male    DOB: 1946-06-02, 67 y.o.   MRN: [id number]  HPI  67 year old patient who is seen today for followup. He has a history of hypertension dyslipidemia and impaired glucose tolerance. He is requesting a neurology referral. He has a long history of migraines that occurred the in his childhood. For the past year he has had occasional migraines that have occurred approximately once per month. He states that he has frequent episodes of aura without associated headaches. He is a copy by his wife is also quite concerned about results of a head CT scan that revealed evidence of microvascular ischemia. He probably had also a left basal ganglion lacunar infarct.  Past Medical History  Diagnosis Date  . HYPERTHYROIDISM 01/25/2010  . HYPERTENSION 01/25/2010  . ALLERGIC RHINITIS 01/25/2010  . Vitiligo   . Obesity   . Pericarditis 1980's  . 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  . Impaired glucose tolerance   . OSA (obstructive sleep apnea)     Uses CPAP nightly  . Migraines   . Hx of echocardiogram     a.  Echo 02/03/13:  Mild LVH, mild focal basal septal hypertrophy, EF 50-55%, Gr 1 DD, mild LAE  . Hx of cardiovascular stress test     a. Lexiscan Myoview 02/06/13:  EF 59%, no ischemia or scar    History   Social History  . Marital Status: Married    Spouse Name: N/A    Number of Children: N/A  . Years of Education: N/A   Occupational History  . Not on file.   Social History Main Topics  . Smoking status: Former Games developer  . Smokeless tobacco: Not on file     Comment: Smoked for 8 yrs, quit remotely  . Alcohol Use: Yes     Comment: occasional - max 2x/week  . Drug Use: No  . Sexual Activity: Not on file   Other Topics Concern  . Not on file   Social History Narrative  . No narrative on file    Past Surgical History  Procedure Laterality Date  . Hernia repair      umbilical  . Lumbar laminectomy    . Inguinal hernia  repair      Family History  Problem Relation Age of Onset  . Thyroid disease Sister     goiter  . Stroke Father     Passed away in his 24s  . Sudden death Brother     ? Drug use, no autopsy  . Throat cancer Brother     Allergies  Allergen Reactions  . Relpax [Eletriptan]     Chest pain, hypertension, sweating    Current Outpatient Prescriptions on File Prior to Visit  Medication Sig Dispense Refill  . aspirin EC 81 MG tablet Take 1 tablet (81 mg total) by mouth daily.      . fluticasone (FLONASE) 50 MCG/ACT nasal spray Place 2 sprays into the nose daily.  16 g  2  . losartan (COZAAR) 100 MG tablet Take 1 tablet (100 mg total) by mouth daily.  90 tablet  3  . metoprolol succinate (TOPROL-XL) 100 MG 24 hr tablet Take 1 tablet (100 mg total) by mouth daily.  90 tablet  1  . omeprazole (PRILOSEC) 40 MG capsule Take 1 capsule (40 mg total) by mouth daily.  90 capsule  1  . pravastatin (PRAVACHOL) 40 MG tablet Take 1 tablet (  40 mg total) by mouth daily.  90 tablet  1  . zolpidem (AMBIEN) 5 MG tablet Take 1 tablet (5 mg total) by mouth at bedtime as needed for sleep.  15 tablet  1  . sildenafil (VIAGRA) 100 MG tablet Take 1 tablet (100 mg total) by mouth as needed for erectile dysfunction.  20 tablet  1   No current facility-administered medications on file prior to visit.    BP 138/90  Pulse 74  Temp(Src) 97.9 F (36.6 C) (Oral)  Resp 20  Wt 267 lb (121.11 kg)  BMI 34.27 kg/m2  SpO2 97%        Review of Systems  Constitutional: Negative for fever, chills, appetite change and fatigue.  HENT: Negative for hearing loss, ear pain, congestion, sore throat, trouble swallowing, neck stiffness, dental problem, voice change and tinnitus.   Eyes: Negative for pain, discharge and visual disturbance.  Respiratory: Negative for cough, chest tightness, wheezing and stridor.   Cardiovascular: Negative for chest pain, palpitations and leg swelling.  Gastrointestinal: Negative for  nausea, vomiting, abdominal pain, diarrhea, constipation, blood in stool and abdominal distention.  Genitourinary: Negative for urgency, hematuria, flank pain, discharge, difficulty urinating and genital sores.  Musculoskeletal: Negative for myalgias, back pain, joint swelling, arthralgias and gait problem.  Skin: Negative for rash.  Neurological: Positive for headaches. Negative for dizziness, syncope, speech difficulty, weakness and numbness.  Hematological: Negative for adenopathy. Does not bruise/bleed easily.  Psychiatric/Behavioral: Negative for behavioral problems and dysphoric mood. The patient is not nervous/anxious.        Objective:   Physical Exam  Constitutional: He is oriented to person, place, and time. He appears well-developed.  Repeat blood pressure 124/80  HENT:  Head: Normocephalic.  Right Ear: External ear normal.  Left Ear: External ear normal.  Eyes: Conjunctivae and EOM are normal.  Neck: Normal range of motion.  Cardiovascular: Normal rate and normal heart sounds.   Pulmonary/Chest: Breath sounds normal.  Abdominal: Bowel sounds are normal.  Musculoskeletal: Normal range of motion. He exhibits no edema and no tenderness.  Neurological: He is alert and oriented to person, place, and time.  Psychiatric: He has a normal mood and affect. His behavior is normal.          Assessment & Plan:   Migraine headaches. Options discussed. 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 Risk factor modification encouraged including strict blood pressure control. Weight loss encouraged low-salt diet recommended Impaired glucose tolerance. We'll check a hemoglobin A1c  We'll check carotid artery Doppler study

## 2013-06-23 NOTE — Patient Instructions (Signed)
Limit your sodium (Salt) intake  You need to lose weight.  Consider a lower calorie diet and regular exercise.    It is important that you exercise regularly, at least 20 minutes 3 to 4 times per week.  If you develop chest pain or shortness of breath seek  medical attention.  Please check your blood pressure on a regular basis.  If it is consistently greater than 150/90, please make an office appointment.   

## 2013-06-30 ENCOUNTER — Encounter (INDEPENDENT_AMBULATORY_CARE_PROVIDER_SITE_OTHER): Payer: Medicare Other

## 2013-06-30 DIAGNOSIS — H53129 Transient visual loss, unspecified eye: Secondary | ICD-10-CM

## 2013-06-30 DIAGNOSIS — R7302 Impaired glucose tolerance (oral): Secondary | ICD-10-CM

## 2013-06-30 DIAGNOSIS — I10 Essential (primary) hypertension: Secondary | ICD-10-CM

## 2013-06-30 DIAGNOSIS — I200 Unstable angina: Secondary | ICD-10-CM

## 2013-06-30 DIAGNOSIS — I6529 Occlusion and stenosis of unspecified carotid artery: Secondary | ICD-10-CM

## 2013-08-07 ENCOUNTER — Ambulatory Visit (INDEPENDENT_AMBULATORY_CARE_PROVIDER_SITE_OTHER): Payer: Medicare Other | Admitting: Internal Medicine

## 2013-08-07 DIAGNOSIS — Z23 Encounter for immunization: Secondary | ICD-10-CM

## 2013-09-01 ENCOUNTER — Other Ambulatory Visit: Payer: Self-pay | Admitting: Internal Medicine

## 2013-10-08 ENCOUNTER — Other Ambulatory Visit: Payer: Self-pay | Admitting: Internal Medicine

## 2013-11-12 ENCOUNTER — Encounter: Payer: Self-pay | Admitting: *Deleted

## 2013-11-13 ENCOUNTER — Encounter: Payer: Self-pay | Admitting: Internal Medicine

## 2013-11-13 ENCOUNTER — Ambulatory Visit (INDEPENDENT_AMBULATORY_CARE_PROVIDER_SITE_OTHER): Payer: Medicare Other | Admitting: Internal Medicine

## 2013-11-13 VITALS — BP 130/80 | HR 87 | Temp 98.2°F | Resp 20 | Ht 71.75 in | Wt 267.0 lb

## 2013-11-13 DIAGNOSIS — R7309 Other abnormal glucose: Secondary | ICD-10-CM

## 2013-11-13 DIAGNOSIS — J069 Acute upper respiratory infection, unspecified: Secondary | ICD-10-CM

## 2013-11-13 DIAGNOSIS — I10 Essential (primary) hypertension: Secondary | ICD-10-CM

## 2013-11-13 DIAGNOSIS — B9789 Other viral agents as the cause of diseases classified elsewhere: Secondary | ICD-10-CM

## 2013-11-13 DIAGNOSIS — R7302 Impaired glucose tolerance (oral): Secondary | ICD-10-CM

## 2013-11-13 LAB — GLUCOSE, POCT (MANUAL RESULT ENTRY): POC Glucose: 120 mg/dl — AB (ref 70–99)

## 2013-11-13 MED ORDER — HYDROCODONE-HOMATROPINE 5-1.5 MG/5ML PO SYRP: 5.0000 mL | ORAL_SOLUTION | Freq: Four times a day (QID) | ORAL | Status: AC | PRN

## 2013-11-13 NOTE — Patient Instructions (Addendum)
Acute bronchitis symptoms  are generally not helped by antibiotics.  Take over-the-counter expectorants and cough medications such as  Mucinex DM.  Call if there is no improvement in 5 to 7 days or if he developed worsening cough, fever, or new symptoms, such as shortness of breath or chest pain.  Limit your sodium (Salt) intake  Please check your blood pressure on a regular basis.  If it is consistently greater than 150/90, please make an office appointment.  Return in 6 months for follow-up

## 2013-11-13 NOTE — Progress Notes (Signed)
Subjective:    Patient ID: Gordon Phillips, male    DOB: Nov 27, 1945, 68 y.o.   MRN: [id number]  HPI  68 year old patient who has a history of treated hypertension. The past 2 weeks she has had primarily chest congestion and fatigue. There's been an intermittent mild productive cough which has been well-controlled with Mucinex. No fever chest pain shortness of breath or wheezing His blood pressure has been well on medications and a restricted salt diet.  Past Medical History  Diagnosis Date  . HYPERTHYROIDISM 01/25/2010  . HYPERTENSION 01/25/2010  . ALLERGIC RHINITIS 01/25/2010  . Vitiligo   . Obesity   . Pericarditis 1980's  . 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  . Impaired glucose tolerance   . OSA (obstructive sleep apnea)     Uses CPAP nightly  . Migraines   . Hx of echocardiogram     a.  Echo 02/03/13:  Mild LVH, mild focal basal septal hypertrophy, EF 50-55%, Gr 1 DD, mild LAE  . Hx of cardiovascular stress test     a. Lexiscan Myoview 02/06/13:  EF 59%, no ischemia or scar    History   Social History  . Marital Status: Married    Spouse Name: N/A    Number of Children: N/A  . Years of Education: N/A   Occupational History  . Not on file.   Social History Main Topics  . Smoking status: Former Research scientist (life sciences)  . Smokeless tobacco: Not on file     Comment: Smoked for 8 yrs, quit remotely  . Alcohol Use: Yes     Comment: occasional - max 2x/week  . Drug Use: No  . Sexual Activity: Not on file   Other Topics Concern  . Not on file   Social History Narrative  . No narrative on file    Past Surgical History  Procedure Laterality Date  . Hernia repair      umbilical  . Lumbar laminectomy    . Inguinal hernia repair      Family History  Problem Relation Age of Onset  . Thyroid disease Sister     goiter  . Stroke Father     Passed away in his 59s  . Sudden death Brother     ? Drug use, no autopsy  . Throat cancer Brother     Allergies  Allergen  Reactions  . Relpax [Eletriptan]     Chest pain, hypertension, sweating    Current Outpatient Prescriptions on File Prior to Visit  Medication Sig Dispense Refill  . aspirin EC 81 MG tablet Take 1 tablet (81 mg total) by mouth daily.      . fluticasone (FLONASE) 50 MCG/ACT nasal spray Place 2 sprays into the nose daily.  16 g  2  . losartan (COZAAR) 100 MG tablet Take 1 tablet (100 mg total) by mouth daily.  90 tablet  3  . metoprolol succinate (TOPROL-XL) 100 MG 24 hr tablet TAKE 1 TABLET DAILY  90 tablet  3  . omeprazole (PRILOSEC) 40 MG capsule Take 1 capsule (40 mg total) by mouth daily.  90 capsule  1  . pravastatin (PRAVACHOL) 40 MG tablet TAKE 1 TABLET DAILY  90 tablet  3  . zolpidem (AMBIEN) 5 MG tablet take 1 tablet by mouth at bedtime if needed for sleep  30 tablet  2  . sildenafil (VIAGRA) 100 MG tablet Take 1 tablet (100 mg total) by mouth as needed for erectile  dysfunction.  20 tablet  1   No current facility-administered medications on file prior to visit.    BP 130/80  Pulse 87  Temp(Src) 98.2 F (36.8 C) (Oral)  Resp 20  Ht 5' 11.75" (1.822 m)  Wt 267 lb (121.11 kg)  BMI 36.48 kg/m2  SpO2 96%       Review of Systems  Constitutional: Positive for activity change, appetite change and fatigue. Negative for fever and chills.  HENT: Negative for congestion, dental problem, ear pain, hearing loss, sore throat, tinnitus, trouble swallowing and voice change.   Eyes: Negative for pain, discharge and visual disturbance.  Respiratory: Positive for cough. Negative for chest tightness, wheezing and stridor.   Cardiovascular: Negative for chest pain, palpitations and leg swelling.  Gastrointestinal: Negative for nausea, vomiting, abdominal pain, diarrhea, constipation, blood in stool and abdominal distention.  Genitourinary: Negative for urgency, hematuria, flank pain, discharge, difficulty urinating and genital sores.  Musculoskeletal: Negative for arthralgias, back pain,  gait problem, joint swelling, myalgias and neck stiffness.  Skin: Negative for rash.  Neurological: Negative for dizziness, syncope, speech difficulty, weakness, numbness and headaches.  Hematological: Negative for adenopathy. Does not bruise/bleed easily.  Psychiatric/Behavioral: Negative for behavioral problems and dysphoric mood. The patient is not nervous/anxious.        Objective:   Physical Exam  Constitutional: He is oriented to person, place, and time. He appears well-developed.  HENT:  Head: Normocephalic.  Right Ear: External ear normal.  Left Ear: External ear normal.  Eyes: Conjunctivae and EOM are normal.  Neck: Normal range of motion.  Cardiovascular: Normal rate and normal heart sounds.   Pulmonary/Chest: Breath sounds normal. He has no wheezes. He has no rales.  Abdominal: Bowel sounds are normal.  Musculoskeletal: Normal range of motion. He exhibits no edema and no tenderness.  Neurological: He is alert and oriented to person, place, and time.  Skin:  Vitiligo  Psychiatric: He has a normal mood and affect. His behavior is normal.          Assessment & Plan:   Viral URI with cough. Will treat symptomatically Hypertension well controlled

## 2013-11-14 ENCOUNTER — Telehealth: Payer: Self-pay | Admitting: Internal Medicine

## 2013-11-14 NOTE — Telephone Encounter (Signed)
Relevant patient education assigned to patient using Emmi. ° °

## 2013-11-18 ENCOUNTER — Telehealth: Payer: Self-pay | Admitting: Internal Medicine

## 2013-11-18 MED ORDER — AZITHROMYCIN 250 MG PO TABS: 500.0000 mg | ORAL_TABLET | Freq: Every day | ORAL | Status: DC

## 2013-11-18 NOTE — Telephone Encounter (Signed)
Please call in a prescription for azithromycin 250 mg #6 2 tablets once daily for 3 consecutive days

## 2013-11-18 NOTE — Telephone Encounter (Signed)
Please advise 

## 2013-11-18 NOTE — Telephone Encounter (Signed)
Left detailed message Rx sent to pharmacy call if any questions.

## 2013-11-18 NOTE — Telephone Encounter (Signed)
Wife states pt is worse and may need antibiotic. Pt has a bad cough/ chest congestion.  [facility]/ [address]

## 2013-11-24 NOTE — Telephone Encounter (Signed)
Please call pt and schedule appointment tomorrow afternoon per Dr. Raliegh Ip

## 2013-11-24 NOTE — Telephone Encounter (Signed)
Return office visit tomorrow afternoon if unimproved

## 2013-11-24 NOTE — Telephone Encounter (Signed)
Please advise 

## 2013-11-24 NOTE — Telephone Encounter (Signed)
Noted  

## 2013-11-24 NOTE — Telephone Encounter (Signed)
Pt's wife states pt is not much better. Has a severe cough. would like to know if you want him to come in or try something else. Pt cannot come in until tomorrow afternoon anyway

## 2013-11-24 NOTE — Telephone Encounter (Signed)
Pt refused appt, want to give it another week

## 2014-01-29 ENCOUNTER — Encounter: Payer: Self-pay | Admitting: Internal Medicine

## 2014-01-29 ENCOUNTER — Ambulatory Visit (INDEPENDENT_AMBULATORY_CARE_PROVIDER_SITE_OTHER): Payer: Medicare Other | Admitting: Internal Medicine

## 2014-01-29 VITALS — BP 130/90 | HR 75 | Temp 98.1°F | Resp 20 | Ht 71.75 in | Wt 270.0 lb

## 2014-01-29 DIAGNOSIS — R7309 Other abnormal glucose: Secondary | ICD-10-CM

## 2014-01-29 DIAGNOSIS — I10 Essential (primary) hypertension: Secondary | ICD-10-CM

## 2014-01-29 DIAGNOSIS — J312 Chronic pharyngitis: Secondary | ICD-10-CM

## 2014-01-29 DIAGNOSIS — E0590 Thyrotoxicosis, unspecified without thyrotoxic crisis or storm: Secondary | ICD-10-CM

## 2014-01-29 DIAGNOSIS — R7302 Impaired glucose tolerance (oral): Secondary | ICD-10-CM

## 2014-01-29 DIAGNOSIS — G4733 Obstructive sleep apnea (adult) (pediatric): Secondary | ICD-10-CM

## 2014-01-29 NOTE — Progress Notes (Signed)
Subjective:    Patient ID: Gordon Phillips, male    DOB: Sep 19, 1946, 68 y.o.   MRN: [id number]  HPI  68 year old patient who has treated hypertension, as well as dyslipidemia.  He complains of pain in the left anterior neck of 4 weeks duration.  He has been using Advil almost daily.  Due to the discomfort.  He is requesting ENT referral He also complains of sleep apnea and daytime sleepiness.  His wife reports nocturnal episodes of apnea.  He does have a history of obesity.  He states that he has used a CPAP intermittently with some benefit.  No history of formal testing for OSA His hypertension has been stable.  Past Medical History  Diagnosis Date  . HYPERTHYROIDISM 01/25/2010  . HYPERTENSION 01/25/2010  . ALLERGIC RHINITIS 01/25/2010  . Vitiligo   . Obesity   . Pericarditis 1980's  . 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  . Impaired glucose tolerance   . OSA (obstructive sleep apnea)     Uses CPAP nightly  . Migraines   . Hx of echocardiogram     a.  Echo 02/03/13:  Mild LVH, mild focal basal septal hypertrophy, EF 50-55%, Gr 1 DD, mild LAE  . Hx of cardiovascular stress test     a. Lexiscan Myoview 02/06/13:  EF 59%, no ischemia or scar    History   Social History  . Marital Status: Married    Spouse Name: N/A    Number of Children: N/A  . Years of Education: N/A   Occupational History  . Not on file.   Social History Main Topics  . Smoking status: Former Research scientist (life sciences)  . Smokeless tobacco: Not on file     Comment: Smoked for 8 yrs, quit remotely  . Alcohol Use: Yes     Comment: occasional - max 2x/week  . Drug Use: No  . Sexual Activity: Not on file   Other Topics Concern  . Not on file   Social History Narrative  . No narrative on file    Past Surgical History  Procedure Laterality Date  . Hernia repair      umbilical  . Lumbar laminectomy    . Inguinal hernia repair      Family History  Problem Relation Age of Onset  . Thyroid disease Sister       goiter  . Stroke Father     Passed away in his 50s  . Sudden death Brother     ? Drug use, no autopsy  . Throat cancer Brother     Allergies  Allergen Reactions  . Relpax [Eletriptan]     Chest pain, hypertension, sweating    Current Outpatient Prescriptions on File Prior to Visit  Medication Sig Dispense Refill  . aspirin EC 81 MG tablet Take 1 tablet (81 mg total) by mouth daily.      Marland Kitchen losartan (COZAAR) 100 MG tablet Take 1 tablet (100 mg total) by mouth daily.  90 tablet  3  . metoprolol succinate (TOPROL-XL) 100 MG 24 hr tablet TAKE 1 TABLET DAILY  90 tablet  3  . omeprazole (PRILOSEC) 40 MG capsule Take 1 capsule (40 mg total) by mouth daily.  90 capsule  1  . pravastatin (PRAVACHOL) 40 MG tablet TAKE 1 TABLET DAILY  90 tablet  3  . zolpidem (AMBIEN) 5 MG tablet take 1 tablet by mouth at bedtime if needed for sleep  30 tablet  2  .  fluticasone (FLONASE) 50 MCG/ACT nasal spray Place 2 sprays into the nose daily.  16 g  2  . sildenafil (VIAGRA) 100 MG tablet Take 1 tablet (100 mg total) by mouth as needed for erectile dysfunction.  20 tablet  1   No current facility-administered medications on file prior to visit.    BP 130/90  Pulse 75  Temp(Src) 98.1 F (36.7 C) (Oral)  Resp 20  Ht 5' 11.75" (1.822 m)  Wt 270 lb (122.471 kg)  BMI 36.89 kg/m2  SpO2 97%       Review of Systems  Constitutional: Negative for fever, chills, appetite change and fatigue.  HENT: Negative for congestion, dental problem, ear pain, hearing loss, sore throat, tinnitus, trouble swallowing and voice change.   Eyes: Negative for pain, discharge and visual disturbance.  Respiratory: Negative for cough, chest tightness, wheezing and stridor.   Cardiovascular: Negative for chest pain, palpitations and leg swelling.  Gastrointestinal: Negative for nausea, vomiting, abdominal pain, diarrhea, constipation, blood in stool and abdominal distention.  Genitourinary: Negative for urgency, hematuria,  flank pain, discharge, difficulty urinating and genital sores.  Musculoskeletal: Negative for arthralgias, back pain, gait problem, joint swelling, myalgias and neck stiffness.  Skin: Negative for rash.  Neurological: Negative for dizziness, syncope, speech difficulty, weakness, numbness and headaches.  Hematological: Negative for adenopathy. Does not bruise/bleed easily.  Psychiatric/Behavioral: Positive for sleep disturbance. Negative for behavioral problems and dysphoric mood. The patient is not nervous/anxious.        Objective:   Physical Exam  Constitutional: He is oriented to person, place, and time. He appears well-developed.  HENT:  Head: Normocephalic.  Right Ear: External ear normal.  Left Ear: External ear normal.  Mouth/Throat: Oropharynx is clear and moist.  Eyes: Conjunctivae and EOM are normal.  Neck: Normal range of motion.  Slight tenderness left anterior neck area, but no adenopathy appreciated  Cardiovascular: Normal rate and normal heart sounds.   Pulmonary/Chest: Breath sounds normal.  Abdominal: Bowel sounds are normal.  Musculoskeletal: Normal range of motion. He exhibits no edema and no tenderness.  Neurological: He is alert and oriented to person, place, and time.  Psychiatric: He has a normal mood and affect. His behavior is normal.          Assessment & Plan:   Chronic left anterior neck pain.  We'll set up for an ENT evaluation Rule out OSA.  We'll set up for a home sleep study Hypertension stable  Schedule CPX

## 2014-01-29 NOTE — Progress Notes (Signed)
Pre-visit discussion using our clinic review tool. No additional management support is needed unless otherwise documented below in the visit note.  

## 2014-01-29 NOTE — Patient Instructions (Signed)
Limit your sodium (Salt) intake    It is important that you exercise regularly, at least 20 minutes 3 to 4 times per week.  If you develop chest pain or shortness of breath seek  medical attention.  You need to lose weight.  Consider a lower calorie diet and regular exercise.  Return in 3 months for follow-up  

## 2014-02-02 ENCOUNTER — Institutional Professional Consult (permissible substitution): Payer: Medicare Other | Admitting: Pulmonary Disease

## 2014-02-23 ENCOUNTER — Institutional Professional Consult (permissible substitution): Payer: Medicare Other | Admitting: Pulmonary Disease

## 2014-03-16 ENCOUNTER — Encounter (INDEPENDENT_AMBULATORY_CARE_PROVIDER_SITE_OTHER): Payer: Self-pay

## 2014-03-16 ENCOUNTER — Encounter: Payer: Self-pay | Admitting: Pulmonary Disease

## 2014-03-16 ENCOUNTER — Ambulatory Visit (INDEPENDENT_AMBULATORY_CARE_PROVIDER_SITE_OTHER): Payer: Medicare Other | Admitting: Pulmonary Disease

## 2014-03-16 VITALS — BP 124/72 | HR 77 | Temp 97.9°F | Ht 74.0 in | Wt 265.4 lb

## 2014-03-16 DIAGNOSIS — G4733 Obstructive sleep apnea (adult) (pediatric): Secondary | ICD-10-CM

## 2014-03-16 NOTE — Patient Instructions (Signed)
We will proceed with a home study (given your late bedtime) Based on this, you may need a CPAP titration study at our sleep center

## 2014-03-16 NOTE — Assessment & Plan Note (Signed)
We will proceed with a home study (given your late bedtime) Based on this, you may need a CPAP titration study at our sleep center  Given excessive daytime somnolence, narrow pharyngeal exam, witnessed apneas & loud snoring, obstructive sleep apnea is very likely & an overnight polysomnogram will be scheduled as a split study. The pathophysiology of obstructive sleep apnea , it's cardiovascular consequences & modes of treatment including CPAP were discused with the patient in detail & they evidenced understanding.

## 2014-03-16 NOTE — Progress Notes (Signed)
Subjective:    Patient ID: Gordon Phillips, male    DOB: April 21, 1946, 68 y.o.   MRN: [id number]  HPI  68 year old  man referred for evaluation of sleep disordered breathing. Epworth sleepiness score is 5/24.  His wife and a family friend has noted loud snoring and witnessed apneas. He only sleeps for about 5 hours every night. Bedtime is as late as 2 AM, he takes an Ambien about 3 times a week, about 30 minutes prior to bedtime. He sleeps on his side with 2 pillows and reports several nocturnal awakenings for nocturia. He is out of bed by 7 AM feeling tired but denies dryness of mouth or morning headaches. He is gained 10 pounds over the last 2 years. He has cut down his tea and soda intake but still likes a cold drink at bed time. His friend loan him a CPAP machine which he tried with a nasal mask and felt much improved. 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 HYPERTHYROIDISM 01/25/2010  . HYPERTENSION 01/25/2010  . ALLERGIC RHINITIS 01/25/2010  . Vitiligo   . Obesity   . Pericarditis 1980's  . 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  . Impaired glucose tolerance   . OSA (obstructive sleep apnea)     Uses CPAP nightly  . Migraines   . Hx of echocardiogram     a.  Echo 02/03/13:  Mild LVH, mild focal basal septal hypertrophy, EF 50-55%, Gr 1 DD, mild LAE  . Hx of cardiovascular stress test     a. Lexiscan Myoview 02/06/13:  EF 59%, no ischemia or scar    Past Surgical History  Procedure Laterality Date  . Hernia repair      umbilical  . Lumbar laminectomy    . Inguinal hernia repair     Allergies  Allergen Reactions  . Relpax [Eletriptan]     Chest pain, hypertension, sweating   History   Social History  . Marital Status: Married    Spouse Name: N/A    Number of Children: 2  . Years of Education: N/A   Occupational History  .  sales    Social History Main Topics  . Smoking status: Former Smoker -- 8 years    Quit date: 11/06/1966  . Smokeless tobacco: Not on file  . Alcohol Use: Yes     Comment: occasional - max 2x/week  . Drug Use: No  . Sexual Activity: Not on file   Other Topics Concern  . Not on file   Social History Narrative  . No narrative on file   Family History  Problem Relation Age of Onset  . Thyroid disease Sister     goiter  . Stroke Father     Passed away in his 67s  . Sudden death Brother     ? Drug use, no autopsy  . Throat cancer Brother       Review of Systems  Constitutional: Negative for fever and unexpected weight change.  HENT: Negative for congestion, dental problem, ear pain, nosebleeds, postnasal drip, rhinorrhea, sinus pressure, sneezing, sore throat and trouble swallowing.   Eyes: Negative for redness and itching.  Respiratory: Negative for cough, chest tightness, shortness of breath and wheezing.   Cardiovascular: Negative for palpitations and leg swelling.  Gastrointestinal: Negative for nausea and  vomiting.  Genitourinary: Negative for dysuria.  Musculoskeletal: Negative for joint swelling.  Skin: Negative for rash.  Neurological: Negative for headaches.  Hematological: Does not bruise/bleed easily.  Psychiatric/Behavioral: Negative for dysphoric mood. The patient is not nervous/anxious.        Objective:   Physical Exam  Gen. Pleasant, obese, in no distress, normal affect ENT - no lesions, no post nasal drip, class 2-3 airway Neck: No JVD, no thyromegaly, no carotid bruits Lungs: no use of accessory muscles, no dullness to percussion, decreased without rales or rhonchi  Cardiovascular: Rhythm regular, heart sounds  normal, no murmurs or gallops, no peripheral edema Abdomen: soft and non-tender, no hepatosplenomegaly, BS normal. Musculoskeletal: No deformities, no cyanosis or clubbing Neuro:  alert, non focal, no tremors       Assessment & Plan:

## 2014-04-06 ENCOUNTER — Ambulatory Visit (INDEPENDENT_AMBULATORY_CARE_PROVIDER_SITE_OTHER): Payer: Medicare Other | Admitting: Internal Medicine

## 2014-04-06 ENCOUNTER — Telehealth: Payer: Self-pay | Admitting: Internal Medicine

## 2014-04-06 ENCOUNTER — Encounter: Payer: Self-pay | Admitting: Internal Medicine

## 2014-04-06 VITALS — BP 130/80 | HR 77 | Temp 98.5°F | Resp 20 | Ht 74.0 in | Wt 263.0 lb

## 2014-04-06 DIAGNOSIS — R7302 Impaired glucose tolerance (oral): Secondary | ICD-10-CM

## 2014-04-06 DIAGNOSIS — I10 Essential (primary) hypertension: Secondary | ICD-10-CM

## 2014-04-06 DIAGNOSIS — R7309 Other abnormal glucose: Secondary | ICD-10-CM

## 2014-04-06 DIAGNOSIS — G4733 Obstructive sleep apnea (adult) (pediatric): Secondary | ICD-10-CM

## 2014-04-06 DIAGNOSIS — M549 Dorsalgia, unspecified: Secondary | ICD-10-CM

## 2014-04-06 MED ORDER — LIDOCAINE 5 % EX PTCH: 1.0000 | MEDICATED_PATCH | CUTANEOUS | Status: DC

## 2014-04-06 MED ORDER — ZOLPIDEM TARTRATE 5 MG PO TABS: ORAL_TABLET | ORAL | Status: DC

## 2014-04-06 NOTE — Telephone Encounter (Signed)
[facility] denied Lidocaine patch, pt must try and fail 2 NSAIDs first.  The representative was unable to give me any alternatives.

## 2014-04-06 NOTE — Progress Notes (Signed)
Subjective:    Patient ID: Gordon Phillips, male    DOB: Mar 16, 1946, 68 y.o.   MRN: [id number]  HPI  68 year old patient, who presents with a three-week history of intermittent left-sided back pain with radiation to the left flank area.  He states that he has had similar pain since the 1980's and has been evaluated in the past, which has included upper endoscopy.  Pain is intermittent and does not seem to be aggravated by change in position.  He habits, etc.  Today, he is pain free and feels well.  No exertional symptoms. He has treated hypertension, which has been stable. Comorbidities include impaired glucose tolerance hypertension.  He was hospitalized about one year ago for chest pain.  Subsequent nuclear stress test was normal.  Past Medical History  Diagnosis Date  . HYPERTHYROIDISM 01/25/2010  . HYPERTENSION 01/25/2010  . ALLERGIC RHINITIS 01/25/2010  . Vitiligo   . Obesity   . Pericarditis 1980's  . 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  . Impaired glucose tolerance   . OSA (obstructive sleep apnea)     Uses CPAP nightly  . Migraines   . Hx of echocardiogram     a.  Echo 02/03/13:  Mild LVH, mild focal basal septal hypertrophy, EF 50-55%, Gr 1 DD, mild LAE  . Hx of cardiovascular stress test     a. Lexiscan Myoview 02/06/13:  EF 59%, no ischemia or scar    History   Social History  . Marital Status: Married    Spouse Name: N/A    Number of Children: 2  . Years of Education: N/A   Occupational History  . sales    Social History Main Topics  . Smoking status: Former Smoker -- 8 years    Quit date: 11/06/1966  . Smokeless tobacco: Not on file  . Alcohol Use: Yes     Comment: occasional - max 2x/week  . Drug Use: No  . Sexual Activity: Not on file   Other Topics Concern  . Not on file   Social History Narrative  . No narrative on file    Past Surgical History  Procedure Laterality Date  . Hernia repair      umbilical  . Lumbar laminectomy    .  Inguinal hernia repair      Family History  Problem Relation Age of Onset  . Thyroid disease Sister     goiter  . Stroke Father     Passed away in his 67s  . Sudden death Brother     ? Drug use, no autopsy  . Throat cancer Brother     Allergies  Allergen Reactions  . Relpax [Eletriptan]     Chest pain, hypertension, sweating    Current Outpatient Prescriptions on File Prior to Visit  Medication Sig Dispense Refill  . aspirin EC 81 MG tablet Take 1 tablet (81 mg total) by mouth daily.      Marland Kitchen losartan (COZAAR) 100 MG tablet Take 1 tablet (100 mg total) by mouth daily.  90 tablet  3  . metoprolol succinate (TOPROL-XL) 100 MG 24 hr tablet TAKE 1 TABLET DAILY  90 tablet  3  . pravastatin (PRAVACHOL) 40 MG tablet TAKE 1 TABLET DAILY  90 tablet  3  . fluticasone (FLONASE) 50 MCG/ACT nasal spray Place 2 sprays into the nose as needed.      Marland Kitchen omeprazole (PRILOSEC) 40 MG capsule Take 1 capsule (40 mg total) by  mouth daily.  90 capsule  1  . sildenafil (VIAGRA) 100 MG tablet Take 1 tablet (100 mg total) by mouth as needed for erectile dysfunction.  20 tablet  1   No current facility-administered medications on file prior to visit.    BP 130/80  Pulse 77  Temp(Src) 98.5 F (36.9 C) (Oral)  Resp 20  Ht 6\' 2"  (1.88 m)  Wt 263 lb (119.296 kg)  BMI 33.75 kg/m2  SpO2 96%       Review of Systems  Constitutional: Negative for fever, chills, appetite change and fatigue.  HENT: Negative for congestion, dental problem, ear pain, hearing loss, sore throat, tinnitus, trouble swallowing and voice change.   Eyes: Negative for pain, discharge and visual disturbance.  Respiratory: Negative for cough, chest tightness, wheezing and stridor.   Cardiovascular: Negative for chest pain, palpitations and leg swelling.  Gastrointestinal: Negative for nausea, vomiting, abdominal pain, diarrhea, constipation, blood in stool and abdominal distention.  Genitourinary: Negative for urgency, hematuria,  flank pain, discharge, difficulty urinating and genital sores.  Musculoskeletal: Positive for back pain. Negative for arthralgias, gait problem, joint swelling, myalgias and neck stiffness.  Skin: Negative for rash.  Neurological: Negative for dizziness, syncope, speech difficulty, weakness, numbness and headaches.  Hematological: Negative for adenopathy. Does not bruise/bleed easily.  Psychiatric/Behavioral: Negative for behavioral problems and dysphoric mood. The patient is not nervous/anxious.        Objective:   Physical Exam  Constitutional: He is oriented to person, place, and time. He appears well-developed.  HENT:  Head: Normocephalic.  Right Ear: External ear normal.  Left Ear: External ear normal.  Eyes: Conjunctivae and EOM are normal.  Neck: Normal range of motion.  Cardiovascular: Normal rate and normal heart sounds.   Pulmonary/Chest: Breath sounds normal.  Abdominal: Soft. Bowel sounds are normal. He exhibits no distension. There is no tenderness. There is no rebound and no guarding.  Musculoskeletal: Normal range of motion. He exhibits no edema and no tenderness.  Neurological: He is alert and oriented to person, place, and time.  Psychiatric: He has a normal mood and affect. His behavior is normal.          Assessment & Plan:   Intermittent left lumbar and left flank pain.  Presently asymptomatic.  The patient is scheduled for a complete exam in the near future.  We'll observe at this time and reassess at the time of his annual exam Hypertension stable Dyslipidemia.  Continue statin therapy Impaired glucose tolerance.  Will reassess at the time of his annual exam Obesity/OSA.  Weight loss encouraged

## 2014-04-06 NOTE — Telephone Encounter (Signed)
Relevant patient education assigned to patient using Emmi. ° °

## 2014-04-06 NOTE — Patient Instructions (Addendum)
Avoids foods high in acid such as tomatoes citrus juices, and spicy foods.  Avoid eating within two hours of lying down or before exercising.  Do not overheat.  Try smaller more frequent meals.  If symptoms persist, elevate the head of her bed 12 inches while sleeping.  Resume  omeprazole twice daily  Annual exam as scheduled  Call or return to clinic prn if these symptoms worsen or fail to improve as anticipated.

## 2014-04-06 NOTE — Progress Notes (Signed)
Pre-visit discussion using our clinic review tool. No additional management support is needed unless otherwise documented below in the visit note.  

## 2014-04-07 NOTE — Telephone Encounter (Signed)
Please see message and advise 

## 2014-04-22 ENCOUNTER — Telehealth: Payer: Self-pay | Admitting: Pulmonary Disease

## 2014-04-22 NOTE — Telephone Encounter (Signed)
Please advise Gordon Phillips thanks

## 2014-04-23 NOTE — Telephone Encounter (Signed)
Returned pt's call & scheduled him to pick up Gordon Phillips device 7/49/35 @3  pm for his HST Dawne J Law

## 2014-04-27 ENCOUNTER — Other Ambulatory Visit (INDEPENDENT_AMBULATORY_CARE_PROVIDER_SITE_OTHER): Payer: Medicare Other

## 2014-04-27 DIAGNOSIS — Z125 Encounter for screening for malignant neoplasm of prostate: Secondary | ICD-10-CM

## 2014-04-27 DIAGNOSIS — I10 Essential (primary) hypertension: Secondary | ICD-10-CM

## 2014-04-27 DIAGNOSIS — Z0000 Encounter for general adult medical examination without abnormal findings: Secondary | ICD-10-CM

## 2014-04-27 LAB — HEPATIC FUNCTION PANEL
ALT: 18 U/L (ref 0–53)
AST: 23 U/L (ref 0–37)
Albumin: 4.1 g/dL (ref 3.5–5.2)
Alkaline Phosphatase: 44 U/L (ref 39–117)
Bilirubin, Direct: 0.2 mg/dL (ref 0.0–0.3)
Total Bilirubin: 0.9 mg/dL (ref 0.2–1.2)
Total Protein: 7.2 g/dL (ref 6.0–8.3)

## 2014-04-27 LAB — CBC WITH DIFFERENTIAL/PLATELET
Basophils Absolute: 0 10*3/uL (ref 0.0–0.1)
Basophils Relative: 0.5 % (ref 0.0–3.0)
Eosinophils Absolute: 0.1 10*3/uL (ref 0.0–0.7)
Eosinophils Relative: 1.7 % (ref 0.0–5.0)
HCT: 42 % (ref 39.0–52.0)
Hemoglobin: 14.1 g/dL (ref 13.0–17.0)
Lymphocytes Relative: 41.1 % (ref 12.0–46.0)
Lymphs Abs: 2.3 10*3/uL (ref 0.7–4.0)
MCHC: 33.6 g/dL (ref 30.0–36.0)
MCV: 90.9 fl (ref 78.0–100.0)
Monocytes Absolute: 0.3 10*3/uL (ref 0.1–1.0)
Monocytes Relative: 6 % (ref 3.0–12.0)
Neutro Abs: 2.9 10*3/uL (ref 1.4–7.7)
Neutrophils Relative %: 50.7 % (ref 43.0–77.0)
Platelets: 131 10*3/uL — ABNORMAL LOW (ref 150.0–400.0)
RBC: 4.62 Mil/uL (ref 4.22–5.81)
RDW: 14.1 % (ref 11.5–15.5)
WBC: 5.7 10*3/uL (ref 4.0–10.5)

## 2014-04-27 LAB — BASIC METABOLIC PANEL
BUN: 19 mg/dL (ref 6–23)
CO2: 26 mEq/L (ref 19–32)
Calcium: 9.3 mg/dL (ref 8.4–10.5)
Chloride: 106 mEq/L (ref 96–112)
Creatinine, Ser: 1.1 mg/dL (ref 0.4–1.5)
GFR: 90.62 mL/min (ref >60.00)
Glucose, Bld: 110 mg/dL — ABNORMAL HIGH (ref 70–99)
Potassium: 4 mEq/L (ref 3.5–5.1)
Sodium: 141 mEq/L (ref 135–145)

## 2014-04-27 LAB — LIPID PANEL
Cholesterol: 150 mg/dL (ref 0–200)
HDL: 46.2 mg/dL (ref >39.00)
LDL Cholesterol: 96 mg/dL (ref 0–99)
NonHDL: 103.8
Total CHOL/HDL Ratio: 3
Triglycerides: 40 mg/dL (ref 0.0–149.0)
VLDL: 8 mg/dL (ref 0.0–40.0)

## 2014-04-27 LAB — POCT URINALYSIS DIPSTICK
Bilirubin, UA: NEGATIVE
Glucose, UA: NEGATIVE
Ketones, UA: NEGATIVE
Leukocytes, UA: NEGATIVE
Nitrite, UA: NEGATIVE
Protein, UA: NEGATIVE
Spec Grav, UA: 1.025
Urobilinogen, UA: 0.2
pH, UA: 6

## 2014-04-27 LAB — PSA: PSA: 1.77 ng/mL (ref 0.10–4.00)

## 2014-04-27 LAB — TSH: TSH: 0.72 u[IU]/mL (ref 0.35–4.50)

## 2014-04-28 DIAGNOSIS — G4733 Obstructive sleep apnea (adult) (pediatric): Secondary | ICD-10-CM

## 2014-04-29 ENCOUNTER — Telehealth: Payer: Self-pay | Admitting: Pulmonary Disease

## 2014-04-29 DIAGNOSIS — G4733 Obstructive sleep apnea (adult) (pediatric): Secondary | ICD-10-CM

## 2014-04-29 NOTE — Telephone Encounter (Signed)
Home sleep study showed moderate OSA AHI 28 per hour Proceed with CPAP titration study if patient is willing

## 2014-04-30 NOTE — Telephone Encounter (Signed)
lmomtcb x1 

## 2014-04-30 NOTE — Telephone Encounter (Signed)
Pt states that he is returning Dawne's call. Please call back at [phone]

## 2014-04-30 NOTE — Telephone Encounter (Signed)
I spoke with patient about results and he verbalized understanding and had no questions. ORDER PLACED

## 2014-05-04 ENCOUNTER — Encounter: Payer: Self-pay | Admitting: Pulmonary Disease

## 2014-05-04 ENCOUNTER — Encounter: Payer: Medicare Other | Admitting: Internal Medicine

## 2014-05-04 DIAGNOSIS — G4733 Obstructive sleep apnea (adult) (pediatric): Secondary | ICD-10-CM

## 2014-05-20 ENCOUNTER — Other Ambulatory Visit: Payer: Self-pay | Admitting: Internal Medicine

## 2014-06-03 ENCOUNTER — Telehealth: Payer: Self-pay | Admitting: Internal Medicine

## 2014-06-03 NOTE — Telephone Encounter (Signed)
Pt needs refills on pravastatin 40 mg,losartan 100mg  and metprolol 100 mg #90 each w/[facility]

## 2014-06-04 MED ORDER — LOSARTAN POTASSIUM 100 MG PO TABS: 100.0000 mg | ORAL_TABLET | Freq: Every day | ORAL | Status: DC

## 2014-06-04 MED ORDER — METOPROLOL SUCCINATE ER 100 MG PO TB24: ORAL_TABLET | ORAL | Status: DC

## 2014-06-04 MED ORDER — PRAVASTATIN SODIUM 40 MG PO TABS: ORAL_TABLET | ORAL | Status: DC

## 2014-06-04 NOTE — Telephone Encounter (Signed)
Spoke to pt's wife told her Rx's sent to [facility].

## 2014-06-17 ENCOUNTER — Ambulatory Visit (HOSPITAL_BASED_OUTPATIENT_CLINIC_OR_DEPARTMENT_OTHER): Payer: Medicare Other | Attending: Pulmonary Disease | Admitting: Radiology

## 2014-06-17 VITALS — Ht 74.0 in | Wt 253.0 lb

## 2014-06-17 DIAGNOSIS — G4761 Periodic limb movement disorder: Secondary | ICD-10-CM | POA: Insufficient documentation

## 2014-06-17 DIAGNOSIS — G4733 Obstructive sleep apnea (adult) (pediatric): Secondary | ICD-10-CM | POA: Diagnosis present

## 2014-06-19 ENCOUNTER — Telehealth: Payer: Self-pay | Admitting: Pulmonary Disease

## 2014-06-19 DIAGNOSIS — G4733 Obstructive sleep apnea (adult) (pediatric): Secondary | ICD-10-CM

## 2014-06-19 DIAGNOSIS — G4730 Sleep apnea, unspecified: Secondary | ICD-10-CM

## 2014-06-19 DIAGNOSIS — G4710 Hypersomnia, unspecified: Secondary | ICD-10-CM

## 2014-06-19 NOTE — Telephone Encounter (Signed)
lmtcb x1 w/ spouse 

## 2014-06-19 NOTE — Telephone Encounter (Signed)
I spoke with patient about results and he verbalized understanding and had no questions 

## 2014-06-19 NOTE — Telephone Encounter (Signed)
Rx for CPAP has been sent to DME He slept very well with CPAP Needs followup appointment in 6 weeks

## 2014-06-19 NOTE — Sleep Study (Signed)
[place]  NAME: Gordon Phillips  DATE OF BIRTH: [birth year]  MEDICAL RECORD NUMBER [id number]  LOCATION: [facility]  PHYSICIAN: ALVA,RAKESH V.  DATE OF STUDY: 06/17/14   SLEEP STUDY TYPE: CPAP titration study               REFERRING PHYSICIAN: Rigoberto Noel, MD  INDICATION FOR STUDY: 68 year old with moderate OSA . Home study showed AHI of 28 events per hour. At the time of this study ,they weighed 253 pounds with a height of  6 ft 2 inches and the BMI of 32, neck size of 18 inches. Epworth sleepiness score was 3   This CPAP titration polysomnogram was performed with a sleep technologist in attendance. EEG, EOG,EMG and respiratory parameters recorded. Sleep stages, arousals, limb movements and respiratory data was scored according to criteria laid out by the American Academy of sleep medicine.  SLEEP ARCHITECTURE: Lights out was at 2259 PM and lights on was at several AM. Total sleep time was 377 minutes with sleep period time of 415 minutes and sleep efficiency of 89% .Sleep latency was 8 minutes with latency to REM sleep of 46 minutes and wake after sleep onset of 38 minutes.  Sleep stages as a percentage of total sleep time was N1 -5 %,N2- 63 % and REM sleep 32 % ( 120 minutes) . The longest period of REM sleep was around 5:30 AM.   AROUSAL DATA : There were 36 arousals with an arousal index of 5.7 events per hour. Of these 31 were spontaneous, and 5 were associated with respiratory events and 0 were associated periodic limb movements  RESPIRATORY DATA: CPAP was initiated at 5 centimeters and titrated to a final level of 11 centimeters due to respiratory events and snoring. At the final level of 11 centimeters of 320 minutes, there were 1 obstructive apneas, 1 central apneas, 0 mixed apneas and 0 hypopneas with apnea -hypopnea index of 0.4 events per hour.  There was no relation to sleep stage or body position. Titration was  optimal.  MOVEMENT/PARASOMNIA: There were 0 PLMS with a PLM index of 0 events per hour. The PLM arousal index was 0 events per hour.  OXYGEN DATA: The lowest desaturation was 85 % during non-REM sleep and the desaturation index was 1.3 per hour. The saturations stayed below 88% for 0.5 minutes.  CARDIAC DATA: The low heart rate was 34 beats per minute. The high heart rate recorded was an artifact. No arrhythmias were noted  Discussion- supine sleep was not noted due to back pain. He was desensitized with a medium fullface mask.  IMPRESSION :  1. moderate obstructive sleep apnea with hypopneas causing sleep fragmentation and mild oxygen desaturation. 2. This was corrected by CPAP of 11 centimeters with a medium fullface mask. Titration was optimal. 3. No evidence of cardiac arrhythmias or behavioral disturbance during sleep. 4. Periodic limb movements were not noted.  RECOMMENDATION:    1. The treatment options for this degree of sleep disordered breathing includes weight loss and CPAP therapy. CPAP can be initiated at 11 centimeters with a medium fullface mask and compliance monitored at this level. 2. Patient should be cautioned against driving when sleepy 3. They should be asked to avoid medications with sedative side effects  Rigoberto Noel  MD Diplomate, American Board of Sleep Medicine  ELECTRONICALLY SIGNED ON: 06/19/2014  [facility] PH: [phone]   FX: [phone]) [place]  OF SLEEP MEDICINE

## 2014-07-27 ENCOUNTER — Ambulatory Visit: Payer: Medicare Other | Admitting: Pulmonary Disease

## 2014-07-27 ENCOUNTER — Encounter: Payer: Medicare Other | Admitting: Internal Medicine

## 2014-08-03 ENCOUNTER — Emergency Department (HOSPITAL_COMMUNITY): Payer: Medicare Other

## 2014-08-03 ENCOUNTER — Emergency Department (HOSPITAL_COMMUNITY)
Admission: EM | Admit: 2014-08-03 | Discharge: 2014-08-03 | Disposition: A | Discharge status: Home / Self Care | Payer: Medicare Other | Attending: Emergency Medicine | Admitting: Emergency Medicine

## 2014-08-03 ENCOUNTER — Encounter (HOSPITAL_COMMUNITY): Payer: Self-pay | Admitting: Emergency Medicine

## 2014-08-03 DIAGNOSIS — Z8709 Personal history of other diseases of the respiratory system: Secondary | ICD-10-CM | POA: Insufficient documentation

## 2014-08-03 DIAGNOSIS — Z79899 Other long term (current) drug therapy: Secondary | ICD-10-CM | POA: Diagnosis not present

## 2014-08-03 DIAGNOSIS — I10 Essential (primary) hypertension: Secondary | ICD-10-CM | POA: Insufficient documentation

## 2014-08-03 DIAGNOSIS — M25579 Pain in unspecified ankle and joints of unspecified foot: Secondary | ICD-10-CM | POA: Insufficient documentation

## 2014-08-03 DIAGNOSIS — G43909 Migraine, unspecified, not intractable, without status migrainosus: Secondary | ICD-10-CM | POA: Insufficient documentation

## 2014-08-03 DIAGNOSIS — Z9981 Dependence on supplemental oxygen: Secondary | ICD-10-CM | POA: Diagnosis not present

## 2014-08-03 DIAGNOSIS — M25572 Pain in left ankle and joints of left foot: Secondary | ICD-10-CM

## 2014-08-03 DIAGNOSIS — G4733 Obstructive sleep apnea (adult) (pediatric): Secondary | ICD-10-CM | POA: Insufficient documentation

## 2014-08-03 DIAGNOSIS — Z8673 Personal history of transient ischemic attack (TIA), and cerebral infarction without residual deficits: Secondary | ICD-10-CM | POA: Insufficient documentation

## 2014-08-03 DIAGNOSIS — Z7982 Long term (current) use of aspirin: Secondary | ICD-10-CM | POA: Insufficient documentation

## 2014-08-03 DIAGNOSIS — Z872 Personal history of diseases of the skin and subcutaneous tissue: Secondary | ICD-10-CM | POA: Insufficient documentation

## 2014-08-03 DIAGNOSIS — E669 Obesity, unspecified: Secondary | ICD-10-CM | POA: Diagnosis not present

## 2014-08-03 DIAGNOSIS — Z87891 Personal history of nicotine dependence: Secondary | ICD-10-CM | POA: Diagnosis not present

## 2014-08-03 NOTE — ED Notes (Signed)
Pt presents with left ankle pain ongoing over the past 3 weeks- denies recent injury.  Pt states that he works on his feet all day.  Ambulatory in triage.

## 2014-08-03 NOTE — Discharge Instructions (Signed)
Return to the emergency room with worsening of symptoms, new symptoms or with symptoms that are concerning, especially fevers, redness, swelling or warmth to ankle. RICE: Rest, Ice (three cycles of 20 mins on, 101mins off at least twice a day), compression/brace, elevation. Heating pad works well for back pain. Ibuprofen 400mg  (2 tablets 200mg ) every 5-6 hours for 3-5 days and then as needed for pain. Follow up with PCP if symptoms worsen or are persistent. Please call your doctor for a followup appointment within 24-48 hours. When you talk to your doctor please let them know that you were seen in the emergency department and have them acquire all of your records so that they can discuss the findings with you and formulate a treatment plan to fully care for your new and ongoing problems.    Ankle Pain Ankle pain is a common symptom. The bones, cartilage, tendons, and muscles of the ankle joint perform a lot of work each day. The ankle joint holds your body weight and allows you to move around. Ankle pain can occur on either side or back of 1 or both ankles. Ankle pain may be sharp and burning or dull and aching. There may be tenderness, stiffness, redness, or warmth around the ankle. The pain occurs more often when a person walks or puts pressure on the ankle. CAUSES  There are many reasons ankle pain can develop. It is important to work with your caregiver to identify the cause since many conditions can impact the bones, cartilage, muscles, and tendons. Causes for ankle pain include:  Injury, including a break (fracture), sprain, or strain often due to a fall, sports, or a high-impact activity.  Swelling (inflammation) of a tendon (tendonitis).  Achilles tendon rupture.  Ankle instability after repeated sprains and strains.  Poor foot alignment.  Pressure on a nerve (tarsal tunnel syndrome).  Arthritis in the ankle or the lining of the ankle.  Crystal formation in the ankle (gout or  pseudogout). DIAGNOSIS  A diagnosis is based on your medical history, your symptoms, results of your physical exam, and results of diagnostic tests. Diagnostic tests may include X-ray exams or a computerized magnetic scan (magnetic resonance imaging, MRI). TREATMENT  Treatment will depend on the cause of your ankle pain and may include:  Keeping pressure off the ankle and limiting activities.  Using crutches or other walking support (a cane or brace).  Using rest, ice, compression, and elevation.  Participating in physical therapy or home exercises.  Wearing shoe inserts or special shoes.  Losing weight.  Taking medications to reduce pain or swelling or receiving an injection.  Undergoing surgery. HOME CARE INSTRUCTIONS   Only take over-the-counter or prescription medicines for pain, discomfort, or fever as directed by your caregiver.  Put ice on the injured area.  Put ice in a plastic bag.  Place a towel between your skin and the bag.  Leave the ice on for 15-20 minutes at a time, 03-04 times a day.  Keep your leg raised (elevated) when possible to lessen swelling.  Avoid activities that cause ankle pain.  Follow specific exercises as directed by your caregiver.  Record how often you have ankle pain, the location of the pain, and what it feels like. This information may be helpful to you and your caregiver.  Ask your caregiver about returning to work or sports and whether you should drive.  Follow up with your caregiver for further examination, therapy, or testing as directed. SEEK MEDICAL CARE IF:  Pain or swelling continues or worsens beyond 1 week.  You have an oral temperature above 102 F (38.9 C).  You are feeling unwell or have chills.  You are having an increasingly difficult time with walking.  You have loss of sensation or other new symptoms.  You have questions or concerns. MAKE SURE YOU:   Understand these instructions.  Will watch your  condition.  Will get help right away if you are not doing well or get worse. Document Released: 04/12/2010 Document Revised: 01/15/2012 Document Reviewed: 04/12/2010 [facility] Patient Information 2015 [place], [place]. This information is not intended to replace advice given to you by your health care provider. Make sure you discuss any questions you have with your health care provider.

## 2014-08-03 NOTE — ED Provider Notes (Signed)
CSN: [id number]     Arrival date & time 08/03/14  2016 History   First MD Initiated Contact with Patient 08/03/14 2108     Chief Complaint  Patient presents with  . Ankle Pain     (Consider location/radiation/quality/duration/timing/severity/associated sxs/prior Treatment) HPI Gordon Phillips is a 68 y.o. male presenting with with left ankle pain for the past 3 weeks. Pain is cramp on lateral side and achilles. Pain worse with ambulation and standing. Pain decreases with walking. Patient with recent switch in shoes to sneakers from lace up work shoes. 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 Patient able to ambulate with pain. He has taken Ibuprofen intermittently with improvement but pain is persistent. Patient denies edema, erythema.   Past Medical History  Diagnosis Date  . HYPERTHYROIDISM 01/25/2010  . HYPERTENSION 01/25/2010  . ALLERGIC RHINITIS 01/25/2010  . Vitiligo   . Obesity   . Pericarditis 1980's  . 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  . Impaired glucose tolerance   . OSA (obstructive sleep apnea)     Uses CPAP nightly  . Migraines   . Hx of echocardiogram     a.  Echo 02/03/13:  Mild LVH, mild focal basal septal hypertrophy, EF 50-55%, Gr 1 DD, mild LAE  . Hx of cardiovascular stress test     a. Lexiscan Myoview 02/06/13:  EF 59%, no ischemia or scar   Past Surgical History  Procedure Laterality Date  . Hernia repair      umbilical  . Lumbar laminectomy    . Inguinal hernia repair     Family History  Problem Relation Age of Onset  . Thyroid disease Sister     goiter  . Stroke Father     Passed away in his 14s  . Sudden death Brother     ? Drug use, no autopsy  . Throat cancer Brother    History  Substance Use Topics  . Smoking status: Former Smoker -- 8 years    Quit date: 11/06/1966  . Smokeless tobacco: Not on file  . Alcohol Use: Yes     Comment: occasional - max 2x/week    Review of Systems   Musculoskeletal: Negative for joint swelling.  Skin: Negative for wound.  Neurological: Negative for weakness and numbness.      Allergies  Relpax  Home Medications   Prior to Admission medications   Medication Sig Start Date End Date Taking? Authorizing Provider  aspirin EC 81 MG tablet Take 1 tablet (81 mg total) by mouth daily. 02/03/13  Yes Barton Dubois, MD  losartan (COZAAR) 100 MG tablet Take 1 tablet (100 mg total) by mouth daily. 06/04/14  Yes Marletta Lor, MD  metoprolol succinate (TOPROL-XL) 100 MG 24 hr tablet TAKE 1 TABLET DAILY 06/04/14  Yes Marletta Lor, MD  pravastatin (PRAVACHOL) 40 MG tablet TAKE 1 TABLET DAILY 06/04/14  Yes Marletta Lor, MD  zolpidem (AMBIEN) 5 MG tablet take 1 tablet by mouth at bedtime if needed for sleep   Yes Marletta Lor, MD  fluticasone [facility]) 50 MCG/ACT nasal spray Place 2 sprays into the nose as needed. 11/26/12 03/16/14  Marletta Lor, MD  sildenafil (VIAGRA) 100 MG tablet Take 1 tablet (100 mg total) by mouth as needed for erectile dysfunction. 05/29/11 03/16/14  Marletta Lor, MD   BP 147/88  Pulse 96  Resp 20  SpO2 99% Physical  Exam  Nursing note and vitals reviewed. Constitutional: He appears well-developed and well-nourished.  HENT:  Head: Normocephalic and atraumatic.  Eyes: Conjunctivae are normal. Right eye exhibits no discharge. Left eye exhibits no discharge.  Pulmonary/Chest: Effort normal.  Musculoskeletal:  FROM of left ankle without warmth, erythema, edema. Tenderness to lateral dorsal foot and achilles tendon. Achilles intact, normal Thompson test. No leg tenderness. 2+ distal pulses.   Neurological: He is alert. He exhibits normal muscle tone.  Skin: Skin is warm and dry.  Psychiatric: He has a normal mood and affect. His behavior is normal.    ED Course  Procedures (including critical care time) Labs Review Labs Reviewed - No data to display  Imaging Review Dg Ankle  Complete Left  08/03/2014   CLINICAL DATA:  ANKLE PAIN  EXAM: LEFT ANKLE COMPLETE - 3+ VIEW  COMPARISON:  None.  FINDINGS: There is no evidence of fracture, dislocation, or joint effusion. There is no evidence of arthropathy or other focal bone abnormality. Soft tissues are unremarkable.  IMPRESSION: No acute osseous abnormality about the ankle.   Electronically Signed   By: Jeannine Boga M.D.   On: 08/03/2014 22:01     EKG Interpretation None      MDM   Final diagnoses:  Ankle pain, left   Patient with left ankle pain. Patient on his feet all day. VSS. Neurovascularly intact. No edema, warmth, erythema. No fevers, chills. I doubt septic arthritis. Patient ambulatory. Xray without fracture or dislocation. Patient is afebrile, nontoxic, and in no acute distress. Patient is appropriate for outpatient management and is stable for discharge. Rice protocol discussed with patient. Follow up with PCP for persistent pain.  Discussed return precautions with patient. Discussed all results and patient verbalizes understanding and agrees with plan.        Pura Spice, PA-C 08/03/14 2224

## 2014-08-04 NOTE — ED Provider Notes (Signed)
Medical screening examination/treatment/procedure(s) were performed by non-physician practitioner and as supervising physician I was immediately available for consultation/collaboration.   EKG Interpretation None        Ernestina Patches, MD 08/04/14 0003

## 2014-08-10 ENCOUNTER — Encounter: Payer: Self-pay | Admitting: Internal Medicine

## 2014-08-10 ENCOUNTER — Ambulatory Visit (INDEPENDENT_AMBULATORY_CARE_PROVIDER_SITE_OTHER): Payer: Medicare Other | Admitting: Internal Medicine

## 2014-08-10 VITALS — BP 128/70 | HR 98 | Resp 20 | Ht 72.0 in | Wt 269.0 lb

## 2014-08-10 DIAGNOSIS — Z0000 Encounter for general adult medical examination without abnormal findings: Secondary | ICD-10-CM

## 2014-08-10 DIAGNOSIS — R7302 Impaired glucose tolerance (oral): Secondary | ICD-10-CM

## 2014-08-10 DIAGNOSIS — I10 Essential (primary) hypertension: Secondary | ICD-10-CM

## 2014-08-10 DIAGNOSIS — J3089 Other allergic rhinitis: Secondary | ICD-10-CM

## 2014-08-10 DIAGNOSIS — G4733 Obstructive sleep apnea (adult) (pediatric): Secondary | ICD-10-CM

## 2014-08-10 DIAGNOSIS — Z23 Encounter for immunization: Secondary | ICD-10-CM

## 2014-08-10 NOTE — Progress Notes (Signed)
Subjective:     Patient ID: Gordon Phillips, male   DOB: April 24, 1946, 68 y.o.   MRN: [id number]  HPI      History of Present Illness:   68  year-old patient who is seen today for a comprehensive annual exam;   he has an approximate 10 year history of treated hypertension.  Medical problems include the hypertension, history of Graves' disease, history of pericarditis in the 31s and also a history of seasonal allergic rhinitis.  He has been evaluated by pulmonary medicine due to OSA. He has a history of impaired glucose tolerance, and migraine headaches.  He remains on pravastatin for dyslipidemia.   A head CT in 2014 revealed a small left lacunar basal ganglia infarct, as well as chronic microvascular changes   Preventive Screening-Counseling & Management  Alcohol-Tobacco  Smoking Status: quit   Allergies (verified):  No Known Drug Allergies   Past History:   Hypertension  Hyperthyroidism history of Graves' disease, status post I-131 1980s  vitiligo  obesity  history of borderline glaucoma  Obstructive sleep apnea  pericarditis 1980s  Allergic rhinitis  Cerebrovascular disease Migraine headaches  Past Surgical History:  Inguinal herniorrhaphy  Lumbar laminectomy  status post umbilical hernia repair  colonoscopy 2009   Family History: father died at 80. complications of cerebral vascular disease  mother age 23, lives independently  two brothers, one died complications. No cancer  one sister, history of thyroid disease, and goiter   Social History:   Married  Press photographer presently unemployed   two daughters, one with a history of breast cancerSmoking Status: quit   Past Medical History  Diagnosis Date  . HYPERTHYROIDISM 01/25/2010  . HYPERTENSION 01/25/2010  . ALLERGIC RHINITIS 01/25/2010  . Vitiligo   . Obesity   . Pericarditis 1980's  . 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  . Impaired glucose tolerance   . OSA (obstructive sleep apnea)     Uses CPAP  nightly  . Migraines   . Hx of echocardiogram     a.  Echo 02/03/13:  Mild LVH, mild focal basal septal hypertrophy, EF 50-55%, Gr 1 DD, mild LAE  . Hx of cardiovascular stress test     a. Lexiscan Myoview 02/06/13:  EF 59%, no ischemia or scar    History   Social History  . Marital Status: Married    Spouse Name: N/A    Number of Children: 2  . Years of Education: N/A   Occupational History  . sales    Social History Main Topics  . Smoking status: Former Smoker -- 8 years    Quit date: 11/06/1966  . Smokeless tobacco: Not on file  . Alcohol Use: Yes     Comment: occasional - max 2x/week  . Drug Use: No  . Sexual Activity: Not on file   Other Topics Concern  . Not on file   Social History Narrative  . No narrative on file    Past Surgical History  Procedure Laterality Date  . Hernia repair      umbilical  . Lumbar laminectomy    . Inguinal hernia repair      Family History  Problem Relation Age of Onset  . Thyroid disease Sister     goiter  . Stroke Father     Passed away in his 109s  . Sudden death Brother     ? Drug use, no autopsy  . Throat cancer Brother  Allergies  Allergen Reactions  . Relpax [Eletriptan]     Chest pain, hypertension, sweating    Current Outpatient Prescriptions on File Prior to Visit  Medication Sig Dispense Refill  . aspirin EC 81 MG tablet Take 1 tablet (81 mg total) by mouth daily.      Marland Kitchen losartan (COZAAR) 100 MG tablet Take 1 tablet (100 mg total) by mouth daily.  90 tablet  1  . metoprolol succinate (TOPROL-XL) 100 MG 24 hr tablet TAKE 1 TABLET DAILY  90 tablet  1  . pravastatin (PRAVACHOL) 40 MG tablet TAKE 1 TABLET DAILY  90 tablet  1  . zolpidem (AMBIEN) 5 MG tablet take 1 tablet by mouth at bedtime if needed for sleep  30 tablet  2  . fluticasone (FLONASE) 50 MCG/ACT nasal spray Place 2 sprays into the nose as needed.      . sildenafil (VIAGRA) 100 MG tablet Take 1 tablet (100 mg total) by mouth as needed for erectile  dysfunction.  20 tablet  1   No current facility-administered medications on file prior to visit.    BP 128/70  Pulse 98  Resp 20  Ht 6' (1.829 m)  Wt 269 lb (122.018 kg)  BMI 36.48 kg/m2  SpO2 98%    1. Risk factors, based on past  M,S,F history-  cardiovascular risk factors include hypertension.  2.  Physical activities: No activity restrictions  3.  Depression/mood: No history depression or mood disorder  4.  Hearing: No deficits  5.  ADL's: Totally independent in all aspects of daily living  6.  Fall risk: Low  7.  Home safety: No problems identified  8.  Height weight, and visual acuity; height and weight stable no change in visual acuity  9.  Counseling: Weight loss encouraged regular exercise encouraged  10. Lab orders based on risk factors: Laboratory profile including lipid panel reviewed  11. Referral : Not appropriate at this time  12. Care plan: Weight loss exercise Gen. lifestyle modification discussed  13. Cognitive assessment: Alert and oriented with normal affect. No cognitive dysfunction.  14.  Preventive services include annual health maintenance examinations.  Will be due for a followup colonoscopy in about 3 years.  15.  Provider list includes cardiology, and primary care medicine.  He has annual eye examinations.  Is followed periodically by GI     Review of Systems  Constitutional: Negative for fever, chills, activity change, appetite change and fatigue.  HENT: Negative for congestion, dental problem, ear pain, hearing loss, mouth sores, rhinorrhea, sinus pressure, sneezing, tinnitus, trouble swallowing and voice change.   Eyes: Negative for photophobia, pain, redness and visual disturbance.  Respiratory: Negative for apnea, cough, choking, chest tightness, shortness of breath and wheezing.   Cardiovascular: Negative for chest pain, palpitations and leg swelling.  Gastrointestinal: Negative for nausea, vomiting, abdominal pain, diarrhea,  constipation, blood in stool, abdominal distention, anal bleeding and rectal pain.  Genitourinary: Negative for dysuria, urgency, frequency, hematuria, flank pain, decreased urine volume, discharge, penile swelling, scrotal swelling, difficulty urinating, genital sores and testicular pain.  Musculoskeletal: Negative for arthralgias, back pain, gait problem, joint swelling, myalgias, neck pain and neck stiffness.  Skin: Negative for color change, rash and wound.  Neurological: Negative for dizziness, tremors, seizures, syncope, facial asymmetry, speech difficulty, weakness, light-headedness, numbness and headaches.  Hematological: Negative for adenopathy. Does not bruise/bleed easily.  Psychiatric/Behavioral: Negative for suicidal ideas, hallucinations, behavioral problems, confusion, sleep disturbance, self-injury, dysphoric mood, decreased concentration and agitation. The  patient is not nervous/anxious.        Objective:   Physical Exam  Constitutional: He appears well-developed and well-nourished.  Weight 269  HENT:  Head: Normocephalic and atraumatic.  Right Ear: External ear normal.  Left Ear: External ear normal.  Nose: Nose normal.  Mouth/Throat: Oropharynx is clear and moist.  Eyes: Conjunctivae and EOM are normal. Pupils are equal, round, and reactive to light. No scleral icterus.  Neck: Normal range of motion. Neck supple. No JVD present. No thyromegaly present.  Cardiovascular: Regular rhythm, normal heart sounds and intact distal pulses.  Exam reveals no gallop and no friction rub.   No murmur heard. Dorsalis pedis pulses plus 3 Posterior tibial pulses plus 1  Pulmonary/Chest: Effort normal and breath sounds normal. He exhibits no tenderness.  Abdominal: Soft. Bowel sounds are normal. He exhibits no distension and no mass. There is no tenderness.  Genitourinary: Prostate normal and penis normal.  Musculoskeletal: Normal range of motion. He exhibits no edema and no tenderness.   Lymphadenopathy:    He has no cervical adenopathy.  Neurological: He is alert. He has normal reflexes. No cranial nerve deficit. Coordination normal.  Skin: Skin is warm and dry. No rash noted.  Psychiatric: He has a normal mood and affect. His behavior is normal.       Assessment:     Preventive health exam Hypertension stable Exogenous obesity  Cerebrovascular disease.  Will continue aggressive risk factor modification.  We'll continue blood pressure regimen daily, aspirin, and statin therapy      Plan:     Weight loss encouraged Regular exercise encouraged We'll continue present regimen Medications refilled

## 2014-08-10 NOTE — Progress Notes (Signed)
Pre visit review using our clinic review tool, if applicable. No additional management support is needed unless otherwise documented below in the visit note. 

## 2014-08-10 NOTE — Patient Instructions (Signed)
Limit your sodium (Salt) intake    It is important that you exercise regularly, at least 20 minutes 3 to 4 times per week.  If you develop chest pain or shortness of breath seek  medical attention.  You need to lose weight.  Consider a lower calorie diet and regular exercise.  Please check your blood pressure on a regular basis.  If it is consistently greater than 150/90, please make an office appointment.  Return in 6 months for follow-up Health Maintenance A healthy lifestyle and preventative care can promote health and wellness.  Maintain regular health, dental, and eye exams.  Eat a healthy diet. Foods like vegetables, fruits, whole grains, low-fat dairy products, and lean protein foods contain the nutrients you need and are low in calories. Decrease your intake of foods high in solid fats, added sugars, and salt. Get information about a proper diet from your health care provider, if necessary.  Regular physical exercise is one of the most important things you can do for your health. Most adults should get at least 150 minutes of moderate-intensity exercise (any activity that increases your heart rate and causes you to sweat) each week. In addition, most adults need muscle-strengthening exercises on 2 or more days a week.   Maintain a healthy weight. The body mass index (BMI) is a screening tool to identify possible weight problems. It provides an estimate of body fat based on height and weight. Your health care provider can find your BMI and can help you achieve or maintain a healthy weight. For males 20 years and older:  A BMI below 18.5 is considered underweight.  A BMI of 18.5 to 24.9 is normal.  A BMI of 25 to 29.9 is considered overweight.  A BMI of 30 and above is considered obese.  Maintain normal blood lipids and cholesterol by exercising and minimizing your intake of saturated fat. Eat a balanced diet with plenty of fruits and vegetables. Blood tests for lipids and  cholesterol should begin at age 16 and be repeated every 5 years. If your lipid or cholesterol levels are high, you are over age 47, or you are at high risk for heart disease, you may need your cholesterol levels checked more frequently.Ongoing high lipid and cholesterol levels should be treated with medicines if diet and exercise are not working.  If you smoke, find out from your health care provider how to quit. If you do not use tobacco, do not start.  Lung cancer screening is recommended for adults aged 41-80 years who are at high risk for developing lung cancer because of a history of smoking. A yearly low-dose CT scan of the lungs is recommended for people who have at least a 30-pack-year history of smoking and are current smokers or have quit within the past 15 years. A pack year of smoking is smoking an average of 1 pack of cigarettes a day for 1 year (for example, a 30-pack-year history of smoking could mean smoking 1 pack a day for 30 years or 2 packs a day for 15 years). Yearly screening should continue until the smoker has stopped smoking for at least 15 years. Yearly screening should be stopped for people who develop a health problem that would prevent them from having lung cancer treatment.  If you choose to drink alcohol, do not have more than 2 drinks per day. One drink is considered to be 12 oz (360 mL) of beer, 5 oz (150 mL) of wine, or 1.5 oz (45  mL) of liquor.  Avoid the use of street drugs. Do not share needles with anyone. Ask for help if you need support or instructions about stopping the use of drugs.  High blood pressure causes heart disease and increases the risk of stroke. Blood pressure should be checked at least every 1-2 years. Ongoing high blood pressure should be treated with medicines if weight loss and exercise are not effective.  If you are 81-61 years old, ask your health care provider if you should take aspirin to prevent heart disease.  Diabetes screening involves  taking a blood sample to check your fasting blood sugar level. This should be done once every 3 years after age 74 if you are at a normal weight and without risk factors for diabetes. Testing should be considered at a younger age or be carried out more frequently if you are overweight and have at least 1 risk factor for diabetes.  Colorectal cancer can be detected and often prevented. Most routine colorectal cancer screening begins at the age of 12 and continues through age 69. However, your health care provider may recommend screening at an earlier age if you have risk factors for colon cancer. On a yearly basis, your health care provider may provide home test kits to check for hidden blood in the stool. A small camera at the end of a tube may be used to directly examine the colon (sigmoidoscopy or colonoscopy) to detect the earliest forms of colorectal cancer. Talk to your health care provider about this at age 28 when routine screening begins. A direct exam of the colon should be repeated every 5-10 years through age 19, unless early forms of precancerous polyps or small growths are found.  People who are at an increased risk for hepatitis B should be screened for this virus. You are considered at high risk for hepatitis B if:  You were born in a country where hepatitis B occurs often. Talk with your health care provider about which countries are considered high risk.  Your parents were born in a high-risk country and you have not received a shot to protect against hepatitis B (hepatitis B vaccine).  You have HIV or AIDS.  You use needles to inject street drugs.  You live with, or have sex with, someone who has hepatitis B.  You are a man who has sex with other men (MSM).  You get hemodialysis treatment.  You take certain medicines for conditions like cancer, organ transplantation, and autoimmune conditions.  Hepatitis C blood testing is recommended for all people born from [birth year] through 1964  and any individual with known risk factors for hepatitis C.  Healthy men should no longer receive prostate-specific antigen (PSA) blood tests as part of routine cancer screening. Talk to your health care provider about prostate cancer screening.  Testicular cancer screening is not recommended for adolescents or adult males who have no symptoms. Screening includes self-exam, a health care provider exam, and other screening tests. Consult with your health care provider about any symptoms you have or any concerns you have about testicular cancer.  Practice safe sex. Use condoms and avoid high-risk sexual practices to reduce the spread of sexually transmitted infections (STIs).  You should be screened for STIs, including gonorrhea and chlamydia if:  You are sexually active and are younger than 24 years.  You are older than 24 years, and your health care provider tells you that you are at risk for this type of infection.  Your sexual activity  has changed since you were last screened, and you are at an increased risk for chlamydia or gonorrhea. Ask your health care provider if you are at risk.  If you are at risk of being infected with HIV, it is recommended that you take a prescription medicine daily to prevent HIV infection. This is called pre-exposure prophylaxis (PrEP). You are considered at risk if:  You are a man who has sex with other men (MSM).  You are a heterosexual man who is sexually active with multiple partners.  You take drugs by injection.  You are sexually active with a partner who has HIV.  Talk with your health care provider about whether you are at high risk of being infected with HIV. If you choose to begin PrEP, you should first be tested for HIV. You should then be tested every 3 months for as long as you are taking PrEP.  Use sunscreen. Apply sunscreen liberally and repeatedly throughout the day. You should seek shade when your shadow is shorter than you. Protect  yourself by wearing long sleeves, pants, a wide-brimmed hat, and sunglasses year round whenever you are outdoors.  Tell your health care provider of new moles or changes in moles, especially if there is a change in shape or color. Also, tell your health care provider if a mole is larger than the size of a pencil eraser.  A one-time screening for abdominal aortic aneurysm (AAA) and surgical repair of large AAAs by ultrasound is recommended for men aged 26-75 years who are current or former smokers.  Stay current with your vaccines (immunizations). Document Released: 04/20/2008 Document Revised: 10/28/2013 Document Reviewed: 03/20/2011 [facility] Patient Information 2015 [place], [place]. This information is not intended to replace advice given to you by your health care provider. Make sure you discuss any questions you have with your health care provider.

## 2014-09-28 ENCOUNTER — Ambulatory Visit: Payer: Medicare Other | Admitting: Pulmonary Disease

## 2014-10-13 ENCOUNTER — Telehealth: Payer: Self-pay | Admitting: Internal Medicine

## 2014-10-13 MED ORDER — LOSARTAN POTASSIUM 100 MG PO TABS: 100.0000 mg | ORAL_TABLET | Freq: Every day | ORAL | Status: DC

## 2014-10-13 MED ORDER — METOPROLOL SUCCINATE ER 100 MG PO TB24: ORAL_TABLET | ORAL | Status: DC

## 2014-10-13 MED ORDER — PRAVASTATIN SODIUM 40 MG PO TABS: ORAL_TABLET | ORAL | Status: DC

## 2014-10-13 NOTE — Telephone Encounter (Signed)
Pt request refill of the following: metoprolol succinate (TOPROL-XL) 100 MG 24 hr tablet ,pravastatin (PRAVACHOL) 40 MG tablet, losartan (COZAAR) 100 MG tablet   Phamacy: [facility]

## 2014-10-13 NOTE — Telephone Encounter (Signed)
Spoke to pt's wife Diane, told her Rx's were sent to [facility] as requested.

## 2014-10-14 ENCOUNTER — Ambulatory Visit (INDEPENDENT_AMBULATORY_CARE_PROVIDER_SITE_OTHER): Payer: Medicare Other | Admitting: Nurse Practitioner

## 2014-10-14 ENCOUNTER — Encounter: Payer: Self-pay | Admitting: Nurse Practitioner

## 2014-10-14 VITALS — BP 144/78 | HR 79 | Temp 98.0°F | Resp 18 | Ht 74.0 in | Wt 263.0 lb

## 2014-10-14 DIAGNOSIS — T148XXA Other injury of unspecified body region, initial encounter: Secondary | ICD-10-CM

## 2014-10-14 DIAGNOSIS — T148 Other injury of unspecified body region: Secondary | ICD-10-CM

## 2014-10-14 NOTE — Progress Notes (Signed)
Pre visit review using our clinic review tool, if applicable. No additional management support is needed unless otherwise documented below in the visit note. 

## 2014-10-14 NOTE — Patient Instructions (Signed)
Your R buttock is bruised and has an abrasion. No treatment is necessary. The hematoma will resolve within 2 to 3 more weeks.  Safe travels. Merry Christmas!

## 2014-10-14 NOTE — Progress Notes (Signed)
Subjective:     Gordon Phillips is a 68 y.o. male presents for evaluation of "spider bite". He noticed pain when sitting Sat evening-wife looked at backside-she saw large bruise. Pt states he is sore & area is swollen.  The following portions of the patient's history were reviewed and updated as appropriate: allergies, current medications, past medical history, past social history, past surgical history and problem list.  Review of Systems Constitutional: negative for fevers Respiratory: negative for cough Cardiovascular: negative for irregular heart beat Gastrointestinal: negative for diarrhea and nausea Integument/breast: positive for skin lesion(s) Musculoskeletal:negative for arthralgias and myalgias Neurological: negative for headaches    Objective:    BP 144/78 mmHg  Pulse 79  Temp(Src) 98 F (36.7 C) (Oral)  Resp 18  Ht 6\' 2"  (1.88 m)  Wt 263 lb (119.296 kg)  BMI 33.75 kg/m2  SpO2 96% BP 144/78 mmHg  Pulse 79  Temp(Src) 98 F (36.7 C) (Oral)  Resp 18  Ht 6\' 2"  (1.88 m)  Wt 263 lb (119.296 kg)  BMI 33.75 kg/m2  SpO2 96% General appearance: alert, cooperative, appears stated age and no distress Head: Normocephalic, without obvious abnormality, atraumatic Skin: superficial abrasion R buttock surrounded by purple bruise approx 4" X 3"    Assessment: Plan    1. Bruise No treatment necessary Expectations for resolving bruise given. See patient instructions for complete plan.

## 2014-10-21 ENCOUNTER — Telehealth: Payer: Self-pay | Admitting: Internal Medicine

## 2014-10-21 MED ORDER — PRAVASTATIN SODIUM 40 MG PO TABS: ORAL_TABLET | ORAL | Status: DC

## 2014-10-21 MED ORDER — METOPROLOL SUCCINATE ER 100 MG PO TB24: ORAL_TABLET | ORAL | Status: DC

## 2014-10-21 NOTE — Telephone Encounter (Signed)
2 week supply sent to local pharmacy and pt is aware

## 2014-10-21 NOTE — Telephone Encounter (Signed)
Patient's wife called requesting for 7 days worth of pravastatin (PRAVACHOL) 40 MG tablet and metoprolol succinate (TOPROL-XL) 100 MG 24 hr tablet called to [place], [place].[facility]  He is completely out and would like this to hold him over till the medication arrives in the mail.

## 2014-10-29 ENCOUNTER — Telehealth: Payer: Self-pay | Admitting: Internal Medicine

## 2014-10-29 MED ORDER — FLUTICASONE PROPIONATE 50 MCG/ACT NA SUSP: 2.0000 | NASAL | Status: DC | PRN

## 2014-10-29 NOTE — Telephone Encounter (Signed)
Pt states he does need the rx fluticasone (FLONASE) 50 MCG/ACT nasal spray,.  Had told the dr no, but changed his mind. [facility]/ [address]

## 2014-10-29 NOTE — Telephone Encounter (Signed)
Pt's wife notified Rx sent to pharmacy as requested.

## 2014-11-16 ENCOUNTER — Ambulatory Visit (INDEPENDENT_AMBULATORY_CARE_PROVIDER_SITE_OTHER): Payer: Medicare Other | Admitting: Pulmonary Disease

## 2014-11-16 ENCOUNTER — Encounter: Payer: Self-pay | Admitting: Pulmonary Disease

## 2014-11-16 VITALS — BP 142/90 | HR 71 | Temp 97.1°F | Ht 74.0 in | Wt 263.4 lb

## 2014-11-16 DIAGNOSIS — G4733 Obstructive sleep apnea (adult) (pediatric): Secondary | ICD-10-CM | POA: Diagnosis not present

## 2014-11-16 MED ORDER — ZOLPIDEM TARTRATE 5 MG PO TABS: 5.0000 mg | ORAL_TABLET | Freq: Every day | ORAL | Status: DC

## 2014-11-16 NOTE — Patient Instructions (Signed)
Your CPAP is set at 11 cm Trial of new nasal mask  Weight loss encouraged, compliance with goal of at least 4-6 hrs every night is the expectation. Cautioned against driving when sleepy - understanding that sleepiness will vary on a day to day basis

## 2014-11-16 NOTE — Assessment & Plan Note (Signed)
Your CPAP is set at 11 cm Trial of new nasal mask Ambien #[id number] refilled  Weight loss encouraged, compliance with goal of at least 4-6 hrs every night is the expectation. Advised against medications with sedative side effects Cautioned against driving when sleepy - understanding that sleepiness will vary on a day to day basis

## 2014-11-16 NOTE — Progress Notes (Signed)
   Subjective:    Patient ID: Gordon Phillips, male    DOB: 1946/03/10, 69 y.o.   MRN: [id number]  HPI  69 year old man for FU of OSA. 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 Supine sleep was not noted due to back pain. Started on CPAP & feels much better -no snoring, better rested, good energy, mask ok, pr ok, no nasal issues Download 11/2014 on 11 cm >>no residuals, small leak  Review of Systems neg for any significant sore throat, dysphagia, itching, sneezing, nasal congestion or excess/ purulent secretions, fever, chills, sweats, unintended wt loss, pleuritic or exertional cp, hempoptysis, orthopnea pnd or change in chronic leg swelling. Also denies presyncope, palpitations, heartburn, abdominal pain, nausea, vomiting, diarrhea or change in bowel or urinary habits, dysuria,hematuria, rash, arthralgias, visual complaints, headache, numbness weakness or ataxia.     Objective:   Physical Exam  Gen. Pleasant, obese, in no distress ENT - no lesions, no post nasal drip Neck: No JVD, no thyromegaly, no carotid bruits Lungs: no use of accessory muscles, no dullness to percussion, decreased without rales or rhonchi  Cardiovascular: Rhythm regular, heart sounds  normal, no murmurs or gallops, no peripheral edema Musculoskeletal: No deformities, no cyanosis or clubbing , no tremors        Assessment & Plan:

## 2014-12-06 DIAGNOSIS — G4733 Obstructive sleep apnea (adult) (pediatric): Secondary | ICD-10-CM | POA: Diagnosis not present

## 2014-12-14 ENCOUNTER — Encounter (HOSPITAL_COMMUNITY): Payer: Self-pay | Admitting: Emergency Medicine

## 2014-12-14 ENCOUNTER — Emergency Department (HOSPITAL_COMMUNITY): Payer: Medicare Other

## 2014-12-14 ENCOUNTER — Emergency Department (HOSPITAL_COMMUNITY)
Admission: EM | Admit: 2014-12-14 | Discharge: 2014-12-14 | Disposition: A | Discharge status: Home / Self Care | Payer: Medicare Other | Attending: Emergency Medicine | Admitting: Emergency Medicine

## 2014-12-14 DIAGNOSIS — R51 Headache: Secondary | ICD-10-CM | POA: Diagnosis not present

## 2014-12-14 DIAGNOSIS — Y998 Other external cause status: Secondary | ICD-10-CM | POA: Insufficient documentation

## 2014-12-14 DIAGNOSIS — F10120 Alcohol abuse with intoxication, uncomplicated: Secondary | ICD-10-CM | POA: Diagnosis not present

## 2014-12-14 DIAGNOSIS — G43909 Migraine, unspecified, not intractable, without status migrainosus: Secondary | ICD-10-CM | POA: Diagnosis not present

## 2014-12-14 DIAGNOSIS — G4700 Insomnia, unspecified: Secondary | ICD-10-CM | POA: Diagnosis not present

## 2014-12-14 DIAGNOSIS — Z9981 Dependence on supplemental oxygen: Secondary | ICD-10-CM | POA: Diagnosis not present

## 2014-12-14 DIAGNOSIS — Z7982 Long term (current) use of aspirin: Secondary | ICD-10-CM | POA: Insufficient documentation

## 2014-12-14 DIAGNOSIS — Z8673 Personal history of transient ischemic attack (TIA), and cerebral infarction without residual deficits: Secondary | ICD-10-CM | POA: Diagnosis not present

## 2014-12-14 DIAGNOSIS — Z7952 Long term (current) use of systemic steroids: Secondary | ICD-10-CM | POA: Diagnosis not present

## 2014-12-14 DIAGNOSIS — Z87891 Personal history of nicotine dependence: Secondary | ICD-10-CM | POA: Insufficient documentation

## 2014-12-14 DIAGNOSIS — W010XXA Fall on same level from slipping, tripping and stumbling without subsequent striking against object, initial encounter: Secondary | ICD-10-CM | POA: Insufficient documentation

## 2014-12-14 DIAGNOSIS — S01511A Laceration without foreign body of lip, initial encounter: Secondary | ICD-10-CM | POA: Diagnosis not present

## 2014-12-14 DIAGNOSIS — I10 Essential (primary) hypertension: Secondary | ICD-10-CM | POA: Diagnosis not present

## 2014-12-14 DIAGNOSIS — Y908 Blood alcohol level of 240 mg/100 ml or more: Secondary | ICD-10-CM | POA: Diagnosis not present

## 2014-12-14 DIAGNOSIS — S3690XA Unspecified injury of unspecified intra-abdominal organ, initial encounter: Secondary | ICD-10-CM | POA: Diagnosis not present

## 2014-12-14 DIAGNOSIS — Y9389 Activity, other specified: Secondary | ICD-10-CM | POA: Diagnosis not present

## 2014-12-14 DIAGNOSIS — S01111A Laceration without foreign body of right eyelid and periocular area, initial encounter: Secondary | ICD-10-CM | POA: Insufficient documentation

## 2014-12-14 DIAGNOSIS — E669 Obesity, unspecified: Secondary | ICD-10-CM | POA: Diagnosis not present

## 2014-12-14 DIAGNOSIS — Y9289 Other specified places as the place of occurrence of the external cause: Secondary | ICD-10-CM | POA: Insufficient documentation

## 2014-12-14 DIAGNOSIS — S0181XA Laceration without foreign body of other part of head, initial encounter: Secondary | ICD-10-CM

## 2014-12-14 DIAGNOSIS — S199XXA Unspecified injury of neck, initial encounter: Secondary | ICD-10-CM | POA: Diagnosis not present

## 2014-12-14 DIAGNOSIS — S0541XA Penetrating wound of orbit with or without foreign body, right eye, initial encounter: Secondary | ICD-10-CM | POA: Diagnosis not present

## 2014-12-14 DIAGNOSIS — F1020 Alcohol dependence, uncomplicated: Secondary | ICD-10-CM | POA: Diagnosis not present

## 2014-12-14 DIAGNOSIS — F10920 Alcohol use, unspecified with intoxication, uncomplicated: Secondary | ICD-10-CM

## 2014-12-14 DIAGNOSIS — W19XXXA Unspecified fall, initial encounter: Secondary | ICD-10-CM

## 2014-12-14 LAB — COMPREHENSIVE METABOLIC PANEL
ALT: 18 U/L (ref 0–53)
AST: 31 U/L (ref 0–37)
Albumin: 4.3 g/dL (ref 3.5–5.2)
Alkaline Phosphatase: 62 U/L (ref 39–117)
Anion gap: 7 (ref 5–15)
BUN: 13 mg/dL (ref 6–23)
CO2: 27 mmol/L (ref 19–32)
Calcium: 9.2 mg/dL (ref 8.4–10.5)
Chloride: 107 mmol/L (ref 96–112)
Creatinine, Ser: 1.02 mg/dL (ref 0.50–1.35)
GFR calc Af Amer: 86 mL/min — ABNORMAL LOW (ref >90)
GFR calc non Af Amer: 74 mL/min — ABNORMAL LOW (ref >90)
Glucose, Bld: 105 mg/dL — ABNORMAL HIGH (ref 70–99)
Potassium: 3.8 mmol/L (ref 3.5–5.1)
Sodium: 141 mmol/L (ref 135–145)
Total Bilirubin: 0.5 mg/dL (ref 0.3–1.2)
Total Protein: 8 g/dL (ref 6.0–8.3)

## 2014-12-14 LAB — ETHANOL: Alcohol, Ethyl (B): 242 mg/dL — ABNORMAL HIGH (ref 0–9)

## 2014-12-14 LAB — HEPATITIS B SURFACE ANTIGEN: Hepatitis B Surface Ag: NEGATIVE

## 2014-12-14 LAB — RAPID URINE DRUG SCREEN, HOSP PERFORMED
Amphetamines: NOT DETECTED
Barbiturates: NOT DETECTED
Benzodiazepines: NOT DETECTED
Cocaine: NOT DETECTED
Opiates: NOT DETECTED
Tetrahydrocannabinol: NOT DETECTED

## 2014-12-14 LAB — CBC
HCT: 43.2 % (ref 39.0–52.0)
Hemoglobin: 14.6 g/dL (ref 13.0–17.0)
MCH: 30.9 pg (ref 26.0–34.0)
MCHC: 33.8 g/dL (ref 30.0–36.0)
MCV: 91.3 fL (ref 78.0–100.0)
Platelets: 144 10*3/uL — ABNORMAL LOW (ref 150–400)
RBC: 4.73 MIL/uL (ref 4.22–5.81)
RDW: 13.5 % (ref 11.5–15.5)
WBC: 5.8 10*3/uL (ref 4.0–10.5)

## 2014-12-14 LAB — RAPID HIV SCREEN (WH-MAU): Rapid HIV Screen: NONREACTIVE

## 2014-12-14 MED ORDER — CYCLOBENZAPRINE HCL 5 MG PO TABS: 5.0000 mg | ORAL_TABLET | Freq: Three times a day (TID) | ORAL | Status: DC | PRN

## 2014-12-14 MED ORDER — FENTANYL CITRATE 0.05 MG/ML IJ SOLN
50.0000 ug | Freq: Once | INTRAMUSCULAR | Status: AC
  Administered 2014-12-14: 50 ug via INTRAVENOUS
  Filled 2014-12-14: qty 2

## 2014-12-14 MED ORDER — OXYCODONE-ACETAMINOPHEN 5-325 MG PO TABS: 1.0000 | ORAL_TABLET | Freq: Four times a day (QID) | ORAL | Status: DC | PRN

## 2014-12-14 MED ORDER — TETANUS-DIPHTH-ACELL PERTUSSIS 5-2.5-18.5 LF-MCG/0.5 IM SUSP
0.5000 mL | Freq: Once | INTRAMUSCULAR | Status: AC
  Administered 2014-12-14: 0.5 mL via INTRAMUSCULAR
  Filled 2014-12-14: qty 0.5

## 2014-12-14 MED ORDER — LIDOCAINE-EPINEPHRINE (PF) 2 %-1:200000 IJ SOLN
20.0000 mL | Freq: Once | INTRAMUSCULAR | Status: DC
  Filled 2014-12-14: qty 20

## 2014-12-14 MED ORDER — FENTANYL CITRATE 0.05 MG/ML IJ SOLN
50.0000 ug | Freq: Once | INTRAMUSCULAR | Status: AC
  Administered 2014-12-14: 50 ug via INTRAVENOUS

## 2014-12-14 NOTE — ED Notes (Signed)
Per EMS, Pt tripped and fell putting head through a wall. Denies LOC EMS noted lacerations to R eyebrow and upper lip, bleeding controlled. Pt hypertensive with EMS, cooperative but suspicious for ETOH. States chronic back pain.

## 2014-12-14 NOTE — Discharge Instructions (Signed)
Return to the ED with any concerns including vomiting, seizure activity, weakness of arms or legs, fainting, decreased level of alertness/lethargy, or any other alarming symptoms  The sutures should be removed in 3-5 days  The CT scan of your neck did not show any traumatic injury- there were some bone findings that may represent multiple myeloma- in order to investigate this further you should discuss this with your primary care doctor and may need to have a bone scan performed.

## 2014-12-14 NOTE — ED Notes (Signed)
Spouse approached the nursing station anxious about wait time. Apologized and explained cause for delay. She also has concerns for helping move the patient at home.

## 2014-12-14 NOTE — ED Provider Notes (Signed)
CSN: [id number]     Arrival date & time 12/14/14  0304 History   First MD Initiated Contact with Patient 12/14/14 [phone]     Chief Complaint  Patient presents with  . Fall  . Laceration     (Consider location/radiation/quality/duration/timing/severity/associated sxs/prior Treatment) HPI  A LEVEL 5 CAVEAT PERTAINS DUE TO ALCOHOL INTOXICATION.  Per patient and wife patient was drinking while watching the superbowl.  He got up, was walking and fell forward hitting his head into a wall.  No LOC.  Pt c/o neck pain.    Past Medical History  Diagnosis Date  . HYPERTHYROIDISM 01/25/2010  . HYPERTENSION 01/25/2010  . ALLERGIC RHINITIS 01/25/2010  . Vitiligo   . Obesity   . Pericarditis 1980's  . 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  . Impaired glucose tolerance   . OSA (obstructive sleep apnea)     Uses CPAP nightly  . Migraines   . Hx of echocardiogram     a.  Echo 02/03/13:  Mild LVH, mild focal basal septal hypertrophy, EF 50-55%, Gr 1 DD, mild LAE  . Hx of cardiovascular stress test     a. Lexiscan Myoview 02/06/13:  EF 59%, no ischemia or scar   Past Surgical History  Procedure Laterality Date  . Hernia repair      umbilical  . Lumbar laminectomy    . Inguinal hernia repair     Family History  Problem Relation Age of Onset  . Thyroid disease Sister     goiter  . Stroke Father     Passed away in his 77s  . Sudden death Brother     ? Drug use, no autopsy  . Throat cancer Brother    History  Substance Use Topics  . Smoking status: Former Smoker -- 8 years    Quit date: 11/06/1966  . Smokeless tobacco: Not on file  . Alcohol Use: Yes     Comment: occasional - max 2x/week    Review of Systems  UNABLE TO OBTAIN ROS DUE TO LEVEL 5 CAVEAT    Allergies  Relpax  Home Medications   Prior to Admission medications   Medication Sig Start Date End Date Taking? Authorizing Provider  aspirin EC 81 MG tablet Take 1 tablet (81 mg total) by mouth daily. 02/03/13   Yes Barton Dubois, MD  fluticasone [facility]) 50 MCG/ACT nasal spray Place 2 sprays into both nostrils as needed. Patient taking differently: Place 2 sprays into both nostrils daily as needed for allergies.  10/29/14 02/16/16 Yes Marletta Lor, MD  losartan (COZAAR) 100 MG tablet Take 1 tablet (100 mg total) by mouth daily. 10/13/14  Yes Marletta Lor, MD  metoprolol succinate (TOPROL-XL) 100 MG 24 hr tablet TAKE 1 TABLET DAILY 10/21/14  Yes Marletta Lor, MD  pravastatin (PRAVACHOL) 40 MG tablet TAKE 1 TABLET DAILY 10/21/14  Yes Marletta Lor, MD  sildenafil (VIAGRA) 100 MG tablet Take 1 tablet (100 mg total) by mouth as needed for erectile dysfunction. Patient taking differently: Take 100 mg by mouth daily as needed for erectile dysfunction.  05/29/11 12/14/14 Yes Marletta Lor, MD  zolpidem (AMBIEN) 5 MG tablet Take 1 tablet (5 mg total) by mouth at bedtime. Patient taking differently: Take 5 mg by mouth at bedtime as needed for sleep.  11/16/14  Yes Rigoberto Noel, MD  cyclobenzaprine (FLEXERIL) 5 MG tablet Take 1 tablet (5 mg total) by mouth 3 (three) times  daily as needed for muscle spasms. 12/14/14   Threasa Beards, MD  cyclobenzaprine (FLEXERIL) 5 MG tablet Take 1 tablet (5 mg total) by mouth 3 (three) times daily as needed for muscle spasms. 12/14/14   Threasa Beards, MD  oxyCODONE-acetaminophen (PERCOCET/ROXICET) 5-325 MG per tablet Take 1-2 tablets by mouth every 6 (six) hours as needed for severe pain. 12/14/14   Threasa Beards, MD  oxyCODONE-acetaminophen (PERCOCET/ROXICET) 5-325 MG per tablet Take 1-2 tablets by mouth every 6 (six) hours as needed for severe pain. 12/14/14   Threasa Beards, MD   BP 171/88 mmHg  Pulse 73  Temp(Src) 97.7 F (36.5 C) (Oral)  Resp 20  SpO2 100%  Vitals reviewed Physical Exam Physical Examination: General appearance - alert, well appearing, and in no distress Mental status - alert, oriented to person, place, and time Head- Rosalie,  stellate laceration to right eyebrow/forehead Eyes - pupils equal and reactive, extraocular eye movements intact Mouth - mucous membranes moist, pharynx normal without lesions, laceration to right upper lip, crosses just over vermillion border Neck - ttp over midline of cervical spine as well as paraspinous tenderness bilaterally Chest - clear to auscultation, no wheezes, rales or rhonchi, symmetric air entry Heart - normal rate, regular rhythm, normal S1, S2, no murmurs, rubs, clicks or gallops Abdomen - soft, nontender, nondistended, no masses or organomegaly Back exam - no midline lumbar spine tenerness, paraspinal tenderness in lumbar region bilaterally Neurological - alert, oriented, normal speech, no focal findings or movement disorder noted Musculoskeletal - no joint tenderness, deformity or swelling Extremities - peripheral pulses normal, no pedal edema, no clubbing or cyanosis Skin - normal coloration and turgor, no rashes ED Course  LACERATION REPAIR Date/Time: 12/14/2014 5:37 AM Performed by: Threasa Beards Authorized by: Threasa Beards Consent: Verbal consent obtained. Risks and benefits: risks, benefits and alternatives were discussed Consent given by: patient and spouse Patient understanding: patient states understanding of the procedure being performed Patient identity confirmed: verbally with patient and arm band Time out: Immediately prior to procedure a "time out" was called to verify the correct patient, procedure, equipment, support staff and site/side marked as required. Body area: head/neck Location details: right eyebrow Laceration length: 3 cm Foreign bodies: no foreign bodies Tendon involvement: none Nerve involvement: none Vascular damage: no Anesthesia: local infiltration Local anesthetic: lidocaine 2% with epinephrine Anesthetic total: 2 ml Patient sedated: no Preparation: Patient was prepped and draped in the usual sterile fashion. Irrigation  solution: saline Amount of cleaning: standard Debridement: none Degree of undermining: none Skin closure: 5-0 Prolene Number of sutures: 7 Technique: simple Approximation: close Approximation difficulty: complex Patient tolerance: Patient tolerated the procedure well with no immediate complications Comments: Stellate laceration, some skin abraded away, full closure not possible due to this- wound approximated as best as possible given skin condition   (including critical care time)  5:47 AM pt requesting to leave, he and wife upset about having to wait to have sutures placed.  Lacerations were repaired just after CT results obtained.   Labs Review Labs Reviewed  CBC - Abnormal; Notable for the following:    Platelets 144 (*)    All other components within normal limits  COMPREHENSIVE METABOLIC PANEL - Abnormal; Notable for the following:    Glucose, Bld 105 (*)    GFR calc non Af Amer 74 (*)    GFR calc Af Amer 86 (*)    All other components within normal limits  ETHANOL - Abnormal; Notable  for the following:    Alcohol, Ethyl (B) 242 (*)    All other components within normal limits  URINE RAPID DRUG SCREEN (HOSP PERFORMED)  HIV RAPID SCREEN (BLD OR BODY FLD EXPOSURE)    Imaging Review Ct Head Wo Contrast  12/14/2014   CLINICAL DATA:  Tripped and fell, putting head through a wall. No loss of consciousness. Lacerations to the right eyebrow and upper lip. Concern for head or cervical spine injury. Initial encounter.  EXAM: CT HEAD WITHOUT CONTRAST  CT CERVICAL SPINE WITHOUT CONTRAST  TECHNIQUE: Multidetector CT imaging of the head and cervical spine was performed following the standard protocol without intravenous contrast. Multiplanar CT image reconstructions of the cervical spine were also generated.  COMPARISON:  CT of the head performed 02/03/2013  FINDINGS: CT HEAD FINDINGS  There is no evidence of acute infarction, mass lesion, or intra- or extra-axial hemorrhage on CT.  Mild  periventricular white matter change likely reflects small vessel ischemic microangiopathy.  The posterior fossa, including the cerebellum, brainstem and fourth ventricle, is within normal limits. The third and lateral ventricles, and basal ganglia are unremarkable in appearance. The cerebral hemispheres are symmetric in appearance, with normal gray-white differentiation. No mass effect or midline shift is seen.  There is no evidence of fracture; visualized osseous structures are unremarkable in appearance. The visualized portions of the orbits are within normal limits. The paranasal sinuses and mastoid air cells are well-aerated. A soft tissue laceration is noted overlying the right orbit. Numerous scattered soft tissue calcifications are noted at the vertex.  CT CERVICAL SPINE FINDINGS  There is no evidence of fracture or subluxation. Scattered small lucencies are noted within the visualized osseous structures. Although some of these may simply reflect Schmorl's nodes, a 6 mm lucency is noted within the C5 vertebral body. Multiple myeloma or metastatic disease could conceivably have such an appearance.  Small anterior and posterior disc osteophyte complexes are seen along the cervical spine, and there is mild multilevel disc space narrowing along the cervical spine. There is chronic osseous fusion at C6-C7. Vertebral bodies demonstrate normal height and alignment. Prevertebral soft tissues are within normal limits.  The thyroid gland is unremarkable in appearance. No significant soft tissue abnormalities are seen.  IMPRESSION: 1. No evidence of traumatic intracranial injury or fracture. 2. No evidence of fracture or subluxation along the cervical spine. 3. Soft tissue laceration overlying the right orbit. 4. Scattered small lucencies within the visualized osseous structures, with a 6 mm lucency noted at C6. Would correlate for lab evidence of multiple myeloma; depending on the degree of clinical concern, bone scan  could be considered to exclude metastatic disease. 5. Mild degenerative change along the cervical spine, with chronic osseous fusion at C6-C7.   Electronically Signed   By: Garald Balding M.D.   On: 12/14/2014 04:49   Ct Cervical Spine Wo Contrast  12/14/2014   CLINICAL DATA:  Tripped and fell, putting head through a wall. No loss of consciousness. Lacerations to the right eyebrow and upper lip. Concern for head or cervical spine injury. Initial encounter.  EXAM: CT HEAD WITHOUT CONTRAST  CT CERVICAL SPINE WITHOUT CONTRAST  TECHNIQUE: Multidetector CT imaging of the head and cervical spine was performed following the standard protocol without intravenous contrast. Multiplanar CT image reconstructions of the cervical spine were also generated.  COMPARISON:  CT of the head performed 02/03/2013  FINDINGS: CT HEAD FINDINGS  There is no evidence of acute infarction, mass lesion, or intra- or extra-axial  hemorrhage on CT.  Mild periventricular white matter change likely reflects small vessel ischemic microangiopathy.  The posterior fossa, including the cerebellum, brainstem and fourth ventricle, is within normal limits. The third and lateral ventricles, and basal ganglia are unremarkable in appearance. The cerebral hemispheres are symmetric in appearance, with normal gray-white differentiation. No mass effect or midline shift is seen.  There is no evidence of fracture; visualized osseous structures are unremarkable in appearance. The visualized portions of the orbits are within normal limits. The paranasal sinuses and mastoid air cells are well-aerated. A soft tissue laceration is noted overlying the right orbit. Numerous scattered soft tissue calcifications are noted at the vertex.  CT CERVICAL SPINE FINDINGS  There is no evidence of fracture or subluxation. Scattered small lucencies are noted within the visualized osseous structures. Although some of these may simply reflect Schmorl's nodes, a 6 mm lucency is noted  within the C5 vertebral body. Multiple myeloma or metastatic disease could conceivably have such an appearance.  Small anterior and posterior disc osteophyte complexes are seen along the cervical spine, and there is mild multilevel disc space narrowing along the cervical spine. There is chronic osseous fusion at C6-C7. Vertebral bodies demonstrate normal height and alignment. Prevertebral soft tissues are within normal limits.  The thyroid gland is unremarkable in appearance. No significant soft tissue abnormalities are seen.  IMPRESSION: 1. No evidence of traumatic intracranial injury or fracture. 2. No evidence of fracture or subluxation along the cervical spine. 3. Soft tissue laceration overlying the right orbit. 4. Scattered small lucencies within the visualized osseous structures, with a 6 mm lucency noted at C6. Would correlate for lab evidence of multiple myeloma; depending on the degree of clinical concern, bone scan could be considered to exclude metastatic disease. 5. Mild degenerative change along the cervical spine, with chronic osseous fusion at C6-C7.   Electronically Signed   By: Garald Balding M.D.   On: 12/14/2014 04:49     EKG Interpretation None      MDM   Final diagnoses:  Fall, initial encounter  Alcohol intoxication, uncomplicated  Facial laceration, initial encounter  Lip laceration, initial encounter    Pt presenting with c/o alcohol intoxication and fall- laceration on right eyebrow- stellate laceration in brow. Also laceration of upper lip.  These were both repaired.  CT head and cspine show no traumatic findings.  Possible lytic lesions as noted above- no lab findings that would be c/w multiple myeloma.    Wbc normal, no anemia, no elevation in creatinine or calcium to suggest multiple myeloma.  Pt advised of these findings and advised to d/w PMD and have bone scan as clinically warranted on an outpatient basis.   Pt is talking, ambulatory, he is requesting discharge.   Findings and plan for suture removal both discussed and written on discharge paperwork for patient.    Threasa Beards, MD 12/14/14 [phone]

## 2014-12-14 NOTE — ED Notes (Signed)
Phlebotomy at the bedside  

## 2014-12-14 NOTE — ED Notes (Signed)
Suture cart is at the bedside.

## 2014-12-14 NOTE — ED Notes (Signed)
Patient left from using the restroom without signing for paperwork. Wound care was provided, and extra gauze provided after teaching how to care for wound.

## 2014-12-14 NOTE — ED Notes (Signed)
Dr. Canary Brim and Baker Janus, NP, at the bedside for suturing. Lidocaine with epi 2% was used by them during suturing .

## 2014-12-14 NOTE — ED Notes (Signed)
Spoke to Dr. Canary Brim in regards to patient's complaint of pain. MD acknowledges, gives order for repeat fentanyl.

## 2014-12-16 LAB — HEPATITIS C ANTIBODY (REFLEX): HCV Ab: NEGATIVE

## 2014-12-17 ENCOUNTER — Encounter: Payer: Self-pay | Admitting: Internal Medicine

## 2014-12-17 ENCOUNTER — Ambulatory Visit (INDEPENDENT_AMBULATORY_CARE_PROVIDER_SITE_OTHER): Payer: Medicare Other | Admitting: Internal Medicine

## 2014-12-17 VITALS — BP 136/80 | HR 85 | Temp 98.0°F | Resp 20 | Ht 74.0 in | Wt 256.0 lb

## 2014-12-17 DIAGNOSIS — M542 Cervicalgia: Secondary | ICD-10-CM

## 2014-12-17 DIAGNOSIS — I10 Essential (primary) hypertension: Secondary | ICD-10-CM

## 2014-12-17 DIAGNOSIS — G4733 Obstructive sleep apnea (adult) (pediatric): Secondary | ICD-10-CM | POA: Diagnosis not present

## 2014-12-17 NOTE — Progress Notes (Signed)
Pre visit review using our clinic review tool, if applicable. No additional management support is needed unless otherwise documented below in the visit note. 

## 2014-12-17 NOTE — Progress Notes (Signed)
Subjective:    Patient ID: Gordon Phillips, male    DOB: Apr 28, 1946, 69 y.o.   MRN: [id number]  HPI  69 year old patient who is seen today for suture removal.  5 days ago the patient fell sustaining head trauma.  Sutures were placed involving the right supraorbital area and upper lip.   Scattered small lucencies within the visualized osseous structures, with a 6 mm lucency noted at C6. Would correlate for lab evidence of multiple myeloma; depending on the degree of clinical concern, bone scan could be considered to exclude metastatic disease. 5. Mild degenerative change along the cervical spine, with chronic osseous fusion at C6-C7.  CT of the head and spine were obtained that  revealed scattered small lucencies.  The largest 6 mm noted at C6.  He has chronic neck and back pain.  Past Medical History  Diagnosis Date  . HYPERTHYROIDISM 01/25/2010  . HYPERTENSION 01/25/2010  . ALLERGIC RHINITIS 01/25/2010  . Vitiligo   . Obesity   . Pericarditis 1980's  . 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  . Impaired glucose tolerance   . OSA (obstructive sleep apnea)     Uses CPAP nightly  . Migraines   . Hx of echocardiogram     a.  Echo 02/03/13:  Mild LVH, mild focal basal septal hypertrophy, EF 50-55%, Gr 1 DD, mild LAE  . Hx of cardiovascular stress test     a. Lexiscan Myoview 02/06/13:  EF 59%, no ischemia or scar    History   Social History  . Marital Status: Married    Spouse Name: N/A  . Number of Children: 2  . Years of Education: N/A   Occupational History  . sales    Social History Main Topics  . Smoking status: Former Smoker -- 8 years    Quit date: 11/06/1966  . Smokeless tobacco: Not on file  . Alcohol Use: Yes     Comment: occasional - max 2x/week  . Drug Use: No  . Sexual Activity: Not on file   Other Topics Concern  . Not on file   Social History Narrative    Past Surgical History  Procedure Laterality Date  . Hernia repair     umbilical  . Lumbar laminectomy    . Inguinal hernia repair      Family History  Problem Relation Age of Onset  . Thyroid disease Sister     goiter  . Stroke Father     Passed away in his 56s  . Sudden death Brother     ? Drug use, no autopsy  . Throat cancer Brother     Allergies  Allergen Reactions  . Relpax [Eletriptan]     Chest pain, hypertension, sweating    Current Outpatient Prescriptions on File Prior to Visit  Medication Sig Dispense Refill  . aspirin EC 81 MG tablet Take 1 tablet (81 mg total) by mouth daily.    . cyclobenzaprine (FLEXERIL) 5 MG tablet Take 1 tablet (5 mg total) by mouth 3 (three) times daily as needed for muscle spasms. 20 tablet 0  . fluticasone (FLONASE) 50 MCG/ACT nasal spray Place 2 sprays into both nostrils as needed. (Patient taking differently: Place 2 sprays into both nostrils daily as needed for allergies. ) 16 g 2  . losartan (COZAAR) 100 MG tablet Take 1 tablet (100 mg total) by mouth daily. 90 tablet 3  . metoprolol succinate (TOPROL-XL) 100 MG 24 hr tablet TAKE  1 TABLET DAILY 15 tablet 0  . oxyCODONE-acetaminophen (PERCOCET/ROXICET) 5-325 MG per tablet Take 1-2 tablets by mouth every 6 (six) hours as needed for severe pain. 15 tablet 0  . pravastatin (PRAVACHOL) 40 MG tablet TAKE 1 TABLET DAILY 15 tablet 0  . zolpidem (AMBIEN) 5 MG tablet Take 1 tablet (5 mg total) by mouth at bedtime. (Patient taking differently: Take 5 mg by mouth at bedtime as needed for sleep. ) 30 tablet 0  . sildenafil (VIAGRA) 100 MG tablet Take 1 tablet (100 mg total) by mouth as needed for erectile dysfunction. (Patient taking differently: Take 100 mg by mouth daily as needed for erectile dysfunction. ) 20 tablet 1   No current facility-administered medications on file prior to visit.    BP 136/80 mmHg  Pulse 85  Temp(Src) 98 F (36.7 C) (Oral)  Resp 20  Ht '6\' 2"'  (1.88 m)  Wt 256 lb (116.121 kg)  BMI 32.85 kg/m2  SpO2 97%    Review of Systems    Constitutional: Negative for fever, chills, appetite change and fatigue.  HENT: Negative for congestion, dental problem, ear pain, hearing loss, sore throat, tinnitus, trouble swallowing and voice change.   Eyes: Negative for pain, discharge and visual disturbance.  Respiratory: Negative for cough, chest tightness, wheezing and stridor.   Cardiovascular: Negative for chest pain, palpitations and leg swelling.  Gastrointestinal: Negative for nausea, vomiting, abdominal pain, diarrhea, constipation, blood in stool and abdominal distention.  Genitourinary: Negative for urgency, hematuria, flank pain, discharge, difficulty urinating and genital sores.  Musculoskeletal: Negative for myalgias, back pain, joint swelling, arthralgias, gait problem and neck stiffness.  Skin: Positive for wound. Negative for rash.  Neurological: Negative for dizziness, syncope, speech difficulty, weakness, numbness and headaches.  Hematological: Negative for adenopathy. Does not bruise/bleed easily.  Psychiatric/Behavioral: Negative for behavioral problems and dysphoric mood. The patient is not nervous/anxious.        Objective:   Physical Exam  Constitutional: He appears well-developed and well-nourished. No distress.  Skin:  Healing laceration with sutures present involving the right supraorbital area and upper lip          Assessment & Plan:   Suture removal.  Multiple sutures removed without difficulty Multiple lucencies noted on CT of the head and neck.  We'll have a laboratory screen for multiple myeloma and set up a bone scan to exclude unlikely metastatic disease Hypertension Osteoarthritis  Recheck one month in follow-up

## 2014-12-17 NOTE — Patient Instructions (Addendum)
Limit your sodium (Salt) intake  Suture Removal, Care After Refer to this sheet in the next few weeks. These instructions provide you with information on caring for yourself after your procedure. Your health care provider may also give you more specific instructions. Your treatment has been planned according to current medical practices, but problems sometimes occur. Call your health care provider if you have any problems or questions after your procedure. WHAT TO EXPECT AFTER THE PROCEDURE After your stitches (sutures) are removed, it is typical to have the following:  Some discomfort and swelling in the wound area.  Slight redness in the area. HOME CARE INSTRUCTIONS   If you have skin adhesive strips over the wound area, do not take the strips off. They will fall off on their own in a few days. If the strips remain in place after 14 days, you may remove them.  Change any bandages (dressings) at least once a day or as directed by your health care provider. If the bandage sticks, soak it off with warm, soapy water.  Apply cream or ointment only as directed by your health care provider. If using cream or ointment, wash the area with soap and water 2 times a day to remove all the cream or ointment. Rinse off the soap and pat the area dry with a clean towel.  Keep the wound area dry and clean. If the bandage becomes wet or dirty, or if it develops a bad smell, change it as soon as possible.  Continue to protect the wound from injury.  Use sunscreen when out in the sun. New scars become sunburned easily. SEEK MEDICAL CARE IF:  You have increasing redness, swelling, or pain in the wound.  You see pus coming from the wound.  You have a fever.  You notice a bad smell coming from the wound or dressing.  Your wound breaks open (edges not staying together). Document Released: 07/18/2001 Document Revised: 08/13/2013 Document Reviewed: 06/04/2013 [facility] Patient Information 2015 [place],  [place]. This information is not intended to replace advice given to you by your health care provider. Make sure you discuss any questions you have with your health care provider.

## 2014-12-18 ENCOUNTER — Other Ambulatory Visit: Payer: Self-pay | Admitting: Internal Medicine

## 2014-12-18 DIAGNOSIS — M542 Cervicalgia: Secondary | ICD-10-CM

## 2014-12-18 LAB — CBC WITH DIFFERENTIAL/PLATELET
Basophils Absolute: 0 10*3/uL (ref 0.0–0.1)
Basophils Relative: 0.3 % (ref 0.0–3.0)
Eosinophils Absolute: 0.2 10*3/uL (ref 0.0–0.7)
Eosinophils Relative: 3.4 % (ref 0.0–5.0)
HCT: 41.8 % (ref 39.0–52.0)
Hemoglobin: 14.1 g/dL (ref 13.0–17.0)
Lymphocytes Relative: 30.8 % (ref 12.0–46.0)
Lymphs Abs: 1.9 10*3/uL (ref 0.7–4.0)
MCHC: 33.8 g/dL (ref 30.0–36.0)
MCV: 89.9 fl (ref 78.0–100.0)
Monocytes Absolute: 0.5 10*3/uL (ref 0.1–1.0)
Monocytes Relative: 8.1 % (ref 3.0–12.0)
Neutro Abs: 3.5 10*3/uL (ref 1.4–7.7)
Neutrophils Relative %: 57.4 % (ref 43.0–77.0)
Platelets: 147 10*3/uL — ABNORMAL LOW (ref 150.0–400.0)
RBC: 4.66 Mil/uL (ref 4.22–5.81)
RDW: 13.8 % (ref 11.5–15.5)
WBC: 6.1 10*3/uL (ref 4.0–10.5)

## 2014-12-18 LAB — COMPREHENSIVE METABOLIC PANEL
ALT: 14 U/L (ref 0–53)
AST: 20 U/L (ref 0–37)
Albumin: 4.1 g/dL (ref 3.5–5.2)
Alkaline Phosphatase: 50 U/L (ref 39–117)
BUN: 17 mg/dL (ref 6–23)
CO2: 28 mEq/L (ref 19–32)
Calcium: 9.5 mg/dL (ref 8.4–10.5)
Chloride: 106 mEq/L (ref 96–112)
Creatinine, Ser: 0.96 mg/dL (ref 0.40–1.50)
GFR: 100.3 mL/min (ref >60.00)
Glucose, Bld: 99 mg/dL (ref 70–99)
Potassium: 3.9 mEq/L (ref 3.5–5.1)
Sodium: 140 mEq/L (ref 135–145)
Total Bilirubin: 0.8 mg/dL (ref 0.2–1.2)
Total Protein: 7.7 g/dL (ref 6.0–8.3)

## 2014-12-21 ENCOUNTER — Other Ambulatory Visit: Payer: Self-pay | Admitting: Internal Medicine

## 2014-12-21 DIAGNOSIS — R9389 Abnormal findings on diagnostic imaging of other specified body structures: Secondary | ICD-10-CM

## 2014-12-21 DIAGNOSIS — C9000 Multiple myeloma not having achieved remission: Secondary | ICD-10-CM

## 2014-12-21 LAB — SPEP & IFE WITH QIG
Albumin ELP: 53.6 % — ABNORMAL LOW (ref 55.8–66.1)
Alpha-1-Globulin: 4.4 % (ref 2.9–4.9)
Alpha-2-Globulin: 13.7 % — ABNORMAL HIGH (ref 7.1–11.8)
Beta 2: 5.9 % (ref 3.2–6.5)
Beta Globulin: 4.9 % (ref 4.7–7.2)
Gamma Globulin: 17.5 % (ref 11.1–18.8)
IgA: 157 mg/dL (ref 68–379)
IgG (Immunoglobin G), Serum: 1560 mg/dL (ref 650–1600)
IgM, Serum: 71 mg/dL (ref 41–251)
M-Spike, %: 0.27 g/dL
Total Protein, Serum Electrophoresis: 7 g/dL (ref 6.0–8.3)

## 2014-12-25 ENCOUNTER — Telehealth: Payer: Self-pay | Admitting: *Deleted

## 2014-12-25 ENCOUNTER — Telehealth: Payer: Self-pay | Admitting: Internal Medicine

## 2014-12-25 ENCOUNTER — Other Ambulatory Visit: Payer: Self-pay | Admitting: Pulmonary Disease

## 2014-12-25 ENCOUNTER — Encounter (HOSPITAL_COMMUNITY): Payer: Commercial Managed Care - PPO

## 2014-12-25 NOTE — Telephone Encounter (Signed)
Called Oncology and was told appts are scheduled according to pt's needs.  The oncologist/hematologist do not see new patients every day.  The patient's chart is reviewed and taken to the physician chosen and then the office will call to schedule an appt. Left a message with Donnie Aho HIM at oncology to get an uptdate.  Pt's bone scan was cancelled by the provider because they want the patient to see oncology/heamtolgy first.   Called and spoke with pt and pt is aware of the above information.

## 2014-12-25 NOTE — Telephone Encounter (Signed)
Ambien refilled 11/16/14 for 30 days.  No Refills.  Pt scheduled to see TP on 05/17/15.  Ok to Refill Ambien? Please advise.

## 2014-12-25 NOTE — Telephone Encounter (Signed)
Called and left message.

## 2014-12-25 NOTE — Telephone Encounter (Signed)
[facility] has not returned their call to schedule an appointment with a hematologist. They would like our office to set this up for them please. Bone Scan is set up for Monday and they are not sure if they should proceed  Or not.Also he needs a refill an his Ambien 5MG  called to [facility] on [address].

## 2014-12-25 NOTE — Telephone Encounter (Signed)
Pt is return dr Raliegh Ip call

## 2014-12-28 ENCOUNTER — Encounter (HOSPITAL_COMMUNITY): Payer: Medicare Other

## 2014-12-28 ENCOUNTER — Ambulatory Visit (HOSPITAL_COMMUNITY): Payer: Medicare Other

## 2014-12-28 DIAGNOSIS — M5412 Radiculopathy, cervical region: Secondary | ICD-10-CM | POA: Diagnosis not present

## 2014-12-28 DIAGNOSIS — M5416 Radiculopathy, lumbar region: Secondary | ICD-10-CM | POA: Diagnosis not present

## 2014-12-28 NOTE — Telephone Encounter (Signed)
Rx refilled, signed by Dr. Melvyn Novas in Dr. Bari Mantis absence.  Faxed to pharmacy.  Message given to pharmacy that patient needs to get refills from PCP.  Nothing further needed.

## 2014-12-28 NOTE — Telephone Encounter (Signed)
OK tor efill x1  Further refills from PCP

## 2014-12-29 ENCOUNTER — Telehealth: Payer: Self-pay | Admitting: Hematology

## 2014-12-29 ENCOUNTER — Telehealth: Payer: Self-pay | Admitting: Internal Medicine

## 2014-12-29 ENCOUNTER — Other Ambulatory Visit: Payer: Self-pay | Admitting: Orthopedic Surgery

## 2014-12-29 DIAGNOSIS — M545 Low back pain: Secondary | ICD-10-CM

## 2014-12-29 DIAGNOSIS — M542 Cervicalgia: Secondary | ICD-10-CM

## 2014-12-29 NOTE — Telephone Encounter (Signed)
S/W PT'S WIFE IN REF TO NP APPT ON 01/21/15'@2' :30 REFERRING DR Burnice Logan DX-MULTIPLE MYELOMA AND ABNORMAL CAT SCAN

## 2014-12-29 NOTE — Telephone Encounter (Signed)
Patient wife states she was able to get an appointment on 01/21/15 at [facility] and thank you very much.

## 2014-12-29 NOTE — Telephone Encounter (Signed)
Noted  

## 2014-12-29 NOTE — Telephone Encounter (Signed)
Per  [facility] pt has already been scheduled . Patients appt is scheduled for 01-21-2015 @2 :51 Dr Burr Medico per hematologist office they have left the patient messages regarding appt  And they also informed the pt  Wife of this appt

## 2015-01-04 ENCOUNTER — Telehealth: Payer: Self-pay | Admitting: Cardiology

## 2015-01-04 DIAGNOSIS — G4733 Obstructive sleep apnea (adult) (pediatric): Secondary | ICD-10-CM | POA: Diagnosis not present

## 2015-01-04 NOTE — Telephone Encounter (Signed)
Pt's wife Gordon Phillips states that he had an accident Super Bowl Sunday, he had fallen. EMS was called, he was taken to ED by EMS. Pt was told that his heart rate was fast when he got to ED heart rate had slowed down.  Pt states his wife was told he had heart block but no further evaluation has been done for heart block.

## 2015-01-04 NOTE — Telephone Encounter (Signed)
ED notes 12/14/14 heart--normal rate, normal rhythm.  Pt calling today because he has had palpitations since ED visit. Pt denies lightheadedness or dizziness and states he has not passed out.  Pt confirmed that he tripped and fell [place] Sunday, he did not pass out.  Pt has seen his PCP since Super Bowl Sunday and per pt's wife no further mention of cardiac evaluation has been made.  Pt did note that he has taken some medication for back pain that contains caffeine and that may be contributing to his palpitations.   Pt asking for an appt with Dr Aundra Dubin.  Pt advised that he already has an appt with Dr Aundra Dubin 4/1/6 at 1:45PM. Pt states this appt time is [facility], he is under evaluation for other medical problems at this time, and does not feel a sooner appt is necessary.

## 2015-01-04 NOTE — Telephone Encounter (Signed)
New Message  Pt wife calling about pt heart rate increase.  Pt wanted to speak w/ Rn. Please call back and discuss.

## 2015-01-07 ENCOUNTER — Encounter (HOSPITAL_COMMUNITY): Payer: Self-pay

## 2015-01-07 ENCOUNTER — Emergency Department (HOSPITAL_COMMUNITY)
Admission: EM | Admit: 2015-01-07 | Discharge: 2015-01-08 | Disposition: A | Discharge status: Home / Self Care | Payer: Medicare Other | Attending: Emergency Medicine | Admitting: Emergency Medicine

## 2015-01-07 DIAGNOSIS — S6010XA Contusion of unspecified finger with damage to nail, initial encounter: Secondary | ICD-10-CM

## 2015-01-07 DIAGNOSIS — I10 Essential (primary) hypertension: Secondary | ICD-10-CM | POA: Diagnosis not present

## 2015-01-07 DIAGNOSIS — Z79899 Other long term (current) drug therapy: Secondary | ICD-10-CM | POA: Insufficient documentation

## 2015-01-07 DIAGNOSIS — G4733 Obstructive sleep apnea (adult) (pediatric): Secondary | ICD-10-CM | POA: Diagnosis not present

## 2015-01-07 DIAGNOSIS — M79644 Pain in right finger(s): Secondary | ICD-10-CM | POA: Diagnosis not present

## 2015-01-07 DIAGNOSIS — Z9981 Dependence on supplemental oxygen: Secondary | ICD-10-CM | POA: Insufficient documentation

## 2015-01-07 DIAGNOSIS — Z8673 Personal history of transient ischemic attack (TIA), and cerebral infarction without residual deficits: Secondary | ICD-10-CM | POA: Diagnosis not present

## 2015-01-07 DIAGNOSIS — E669 Obesity, unspecified: Secondary | ICD-10-CM | POA: Insufficient documentation

## 2015-01-07 DIAGNOSIS — S6991XA Unspecified injury of right wrist, hand and finger(s), initial encounter: Secondary | ICD-10-CM | POA: Diagnosis present

## 2015-01-07 DIAGNOSIS — G43909 Migraine, unspecified, not intractable, without status migrainosus: Secondary | ICD-10-CM | POA: Diagnosis not present

## 2015-01-07 DIAGNOSIS — Z7982 Long term (current) use of aspirin: Secondary | ICD-10-CM | POA: Diagnosis not present

## 2015-01-07 DIAGNOSIS — W51XXXA Accidental striking against or bumped into by another person, initial encounter: Secondary | ICD-10-CM | POA: Diagnosis not present

## 2015-01-07 DIAGNOSIS — Y9389 Activity, other specified: Secondary | ICD-10-CM | POA: Insufficient documentation

## 2015-01-07 DIAGNOSIS — Y998 Other external cause status: Secondary | ICD-10-CM | POA: Insufficient documentation

## 2015-01-07 DIAGNOSIS — Z87891 Personal history of nicotine dependence: Secondary | ICD-10-CM | POA: Diagnosis not present

## 2015-01-07 DIAGNOSIS — Z8709 Personal history of other diseases of the respiratory system: Secondary | ICD-10-CM | POA: Insufficient documentation

## 2015-01-07 DIAGNOSIS — Z8669 Personal history of other diseases of the nervous system and sense organs: Secondary | ICD-10-CM | POA: Insufficient documentation

## 2015-01-07 DIAGNOSIS — Y92008 Other place in unspecified non-institutional (private) residence as the place of occurrence of the external cause: Secondary | ICD-10-CM | POA: Insufficient documentation

## 2015-01-07 DIAGNOSIS — S60021A Contusion of right index finger without damage to nail, initial encounter: Secondary | ICD-10-CM | POA: Insufficient documentation

## 2015-01-07 DIAGNOSIS — W231XXA Caught, crushed, jammed, or pinched between stationary objects, initial encounter: Secondary | ICD-10-CM | POA: Diagnosis not present

## 2015-01-07 DIAGNOSIS — Z7951 Long term (current) use of inhaled steroids: Secondary | ICD-10-CM | POA: Insufficient documentation

## 2015-01-07 DIAGNOSIS — Z872 Personal history of diseases of the skin and subcutaneous tissue: Secondary | ICD-10-CM | POA: Insufficient documentation

## 2015-01-07 DIAGNOSIS — S60122A Contusion of left index finger with damage to nail, initial encounter: Secondary | ICD-10-CM | POA: Diagnosis not present

## 2015-01-07 DIAGNOSIS — S6000XA Contusion of unspecified finger without damage to nail, initial encounter: Secondary | ICD-10-CM

## 2015-01-07 MED ORDER — HYDROCODONE-ACETAMINOPHEN 5-325 MG PO TABS
1.0000 | ORAL_TABLET | Freq: Once | ORAL | Status: AC
  Administered 2015-01-08: 1 via ORAL
  Filled 2015-01-07: qty 1

## 2015-01-07 NOTE — ED Provider Notes (Signed)
CSN: [id number]     Arrival date & time 01/07/15  2331 History   First MD Initiated Contact with Patient 01/07/15 2341     Chief Complaint  Patient presents with  . Nail Problem   Pt presents today with trauma to the distal end of the second digit on the right hand. Claims that he shut his finger in a wooden door on Tuesday (2 days ago) at 9 am. Pain was immediate and felt like a throbbing pin and needles that continues on today with a progressively worsening pain 8-9 on a scale of 10. Pt attempted to poke a hole through the nail with a hot paper clip around 2 pm today that did not help relieve any of the pain.    (Consider location/radiation/quality/duration/timing/severity/associated sxs/prior Treatment) The history is provided by the patient. No language interpreter was used.    Past Medical History  Diagnosis Date  . HYPERTHYROIDISM 01/25/2010  . HYPERTENSION 01/25/2010  . ALLERGIC RHINITIS 01/25/2010  . Vitiligo   . Obesity   . Pericarditis 1980's  . 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  . Impaired glucose tolerance   . OSA (obstructive sleep apnea)     Uses CPAP nightly  . Migraines   . Hx of echocardiogram     a.  Echo 02/03/13:  Mild LVH, mild focal basal septal hypertrophy, EF 50-55%, Gr 1 DD, mild LAE  . Hx of cardiovascular stress test     a. Lexiscan Myoview 02/06/13:  EF 59%, no ischemia or scar   Past Surgical History  Procedure Laterality Date  . Hernia repair      umbilical  . Lumbar laminectomy    . Inguinal hernia repair     Family History  Problem Relation Age of Onset  . Thyroid disease Sister     goiter  . Stroke Father     Passed away in his 16s  . Sudden death Brother     ? Drug use, no autopsy  . Throat cancer Brother    History  Substance Use Topics  . Smoking status: Former Smoker -- 8 years    Quit date: 11/06/1966  . Smokeless tobacco: Not on file  . Alcohol Use: Yes     Comment: occasional - max 2x/week    Review of  Systems  Constitutional: Negative for fever.  Musculoskeletal:       See HPI.  Skin: Negative for wound.      Allergies  Relpax  Home Medications   Prior to Admission medications   Medication Sig Start Date End Date Taking? Authorizing Provider  aspirin EC 81 MG tablet Take 1 tablet (81 mg total) by mouth daily. 02/03/13   Barton Dubois, MD  cyclobenzaprine (FLEXERIL) 5 MG tablet Take 1 tablet (5 mg total) by mouth 3 (three) times daily as needed for muscle spasms. 12/14/14   Threasa Beards, MD  fluticasone (FLONASE) 50 MCG/ACT nasal spray Place 2 sprays into both nostrils as needed. Patient taking differently: Place 2 sprays into both nostrils daily as needed for allergies.  10/29/14 02/16/16  Marletta Lor, MD  losartan (COZAAR) 100 MG tablet Take 1 tablet (100 mg total) by mouth daily. 10/13/14   Marletta Lor, MD  metoprolol succinate (TOPROL-XL) 100 MG 24 hr tablet TAKE 1 TABLET DAILY 10/21/14   Marletta Lor, MD  oxyCODONE-acetaminophen (PERCOCET/ROXICET) 5-325 MG per tablet Take 1-2 tablets by mouth every 6 (six) hours as needed  for severe pain. 12/14/14   Threasa Beards, MD  pravastatin (PRAVACHOL) 40 MG tablet TAKE 1 TABLET DAILY 10/21/14   Marletta Lor, MD  sildenafil (VIAGRA) 100 MG tablet Take 1 tablet (100 mg total) by mouth as needed for erectile dysfunction. Patient taking differently: Take 100 mg by mouth daily as needed for erectile dysfunction.  05/29/11 12/14/14  Marletta Lor, MD  zolpidem (AMBIEN) 5 MG tablet take 1 tablet by mouth at bedtime 12/28/14   Tanda Rockers, MD   BP 183/119 mmHg  Pulse 82  Temp(Src) 97.6 F (36.4 C) (Oral)  Resp 18  Ht 6\' 2"  (1.88 m)  Wt 247 lb (112.038 kg)  BMI 31.70 kg/m2  SpO2 100% Physical Exam  Constitutional: He is oriented to person, place, and time. He appears well-developed and well-nourished.  Neck: Normal range of motion.  Pulmonary/Chest: Effort normal.  Musculoskeletal: Normal range of motion.   100% subungual hematoma of left index finger. No bony abnormalities. FROM all joints of fever.   Neurological: He is alert and oriented to person, place, and time.  Skin: Skin is warm and dry.  Psychiatric: He has a normal mood and affect.     ED Course  Procedures (including critical care time)  Digital nerve block performed on the second digit on the right hand using a total of 3 cc of 2% lidocaine using a 27 gauge needle. Five (5) total punctures were made to the nail using a 18 gauge needle with minimal drainage of blood.   Labs Review Labs Reviewed - No data to display  Imaging Review Dg Finger Index Right  01/08/2015   CLINICAL DATA:  Shut right second finger in garage door, with nailbed discoloration and pain. Initial encounter.  EXAM: RIGHT INDEX FINGER 2+V  COMPARISON:  None.  FINDINGS: There is no evidence of fracture or dislocation. Visualized joint spaces are preserved. The known soft tissue injury is not well characterized on radiograph. No radiopaque foreign bodies are seen.  IMPRESSION: No evidence of fracture or dislocation.   Electronically Signed   By: Garald Balding M.D.   On: 01/08/2015 01:00     EKG Interpretation None      MDM   Final diagnoses:  None    1. Contusion left index finger 2. Subungual hematoma  Patient relieved of pain in the finger with digital block. Hematoma draining. Will refer to PCP for recheck as needed.    Dewaine Oats, PA-C 01/08/15 0149  Janice Norrie, MD 01/08/15 [phone]

## 2015-01-07 NOTE — ED Notes (Signed)
Patient reports he shut his right index finger in a door while at work.  Nail/end of finger are black.  He placed a hole in his nail using a hot paper clip.

## 2015-01-08 ENCOUNTER — Emergency Department (HOSPITAL_COMMUNITY): Payer: Medicare Other

## 2015-01-08 DIAGNOSIS — M79644 Pain in right finger(s): Secondary | ICD-10-CM | POA: Diagnosis not present

## 2015-01-08 DIAGNOSIS — S6991XA Unspecified injury of right wrist, hand and finger(s), initial encounter: Secondary | ICD-10-CM | POA: Diagnosis not present

## 2015-01-08 MED ORDER — HYDROCODONE-ACETAMINOPHEN 5-325 MG PO TABS: 1.0000 | ORAL_TABLET | ORAL | Status: DC | PRN

## 2015-01-08 MED ORDER — LIDOCAINE HCL 2 % IJ SOLN
10.0000 mL | Freq: Once | INTRAMUSCULAR | Status: AC
  Administered 2015-01-08: 200 mg via INTRADERMAL
  Filled 2015-01-08: qty 20

## 2015-01-08 NOTE — Discharge Instructions (Signed)
Subungual Hematoma °A subungual hematoma is a pocket of blood that collects under the fingernail or toenail. The pressure created by the blood under the nail can cause pain. °CAUSES  °A subungual hematoma occurs when an injury to the finger or toe causes a blood vessel beneath the nail to break. The injury can occur from a direct blow such as slamming a finger in a door. It can also occur from a repeated injury such as pressure on the foot in a shoe while running. A subungual hematoma is sometimes called runner's toe or tennis toe. °SYMPTOMS  °· Blue or dark blue skin under the nail. °· Pain or throbbing in the injured area. °DIAGNOSIS  °Your caregiver can determine whether you have a subungual hematoma based on your history and a physical exam. If your caregiver thinks you might have a broken (fractured) bone, X-rays may be taken. °TREATMENT  °Hematomas usually go away on their own over time. Your caregiver may make a hole in the nail to drain the blood. Draining the blood is painless and usually provides significant relief from pain and throbbing. The nail usually grows back normally after this procedure. In some cases, the nail may need to be removed. This is done if there is a cut under the nail that requires stitches (sutures). °HOME CARE INSTRUCTIONS  °· Put ice on the injured area. °¨ Put ice in a plastic bag. °¨ Place a towel between your skin and the bag. °¨ Leave the ice on for 15-20 minutes, 03-04 times a day for the first 1 to 2 days. °· Elevate the injured area to help decrease pain and swelling. °· If you were given a bandage, wear it for as long as directed by your caregiver. °· If part of your nail falls off, trim the remaining nail gently. This prevents the nail from catching on something and causing further injury. °· Only take over-the-counter or prescription medicines for pain, discomfort, or fever as directed by your caregiver. °SEEK IMMEDIATE MEDICAL CARE IF:  °· You have redness or swelling  around the nail. °· You have yellowish-white fluid (pus) coming from the nail. °· Your pain is not controlled with medicine. °· You have a fever. °MAKE SURE YOU: °· Understand these instructions. °· Will watch your condition. °· Will get help right away if you are not doing well or get worse. °Document Released: 10/20/2000 Document Revised: 01/15/2012 Document Reviewed: 10/11/2011 °ExitCare® Patient Information ©2015 [employer]. This information is not intended to replace advice given to you by your health care provider. Make sure you discuss any questions you have with your health care provider. ° °

## 2015-01-13 ENCOUNTER — Ambulatory Visit
Admission: RE | Admit: 2015-01-13 | Discharge: 2015-01-13 | Disposition: A | Discharge status: Home / Self Care | Payer: Medicare Other | Source: Ambulatory Visit | Attending: Orthopedic Surgery | Admitting: Orthopedic Surgery

## 2015-01-13 DIAGNOSIS — M542 Cervicalgia: Secondary | ICD-10-CM

## 2015-01-13 DIAGNOSIS — M47812 Spondylosis without myelopathy or radiculopathy, cervical region: Secondary | ICD-10-CM | POA: Diagnosis not present

## 2015-01-13 DIAGNOSIS — M47816 Spondylosis without myelopathy or radiculopathy, lumbar region: Secondary | ICD-10-CM | POA: Diagnosis not present

## 2015-01-13 DIAGNOSIS — M545 Low back pain: Secondary | ICD-10-CM

## 2015-01-21 ENCOUNTER — Encounter: Payer: Self-pay | Admitting: Hematology

## 2015-01-21 ENCOUNTER — Ambulatory Visit: Payer: Medicare Other

## 2015-01-21 ENCOUNTER — Telehealth: Payer: Self-pay | Admitting: Hematology

## 2015-01-21 ENCOUNTER — Ambulatory Visit (HOSPITAL_BASED_OUTPATIENT_CLINIC_OR_DEPARTMENT_OTHER): Payer: Medicare Other | Admitting: Hematology

## 2015-01-21 VITALS — BP 149/82 | HR 76 | Temp 98.4°F | Resp 18 | Ht 74.0 in | Wt 255.5 lb

## 2015-01-21 DIAGNOSIS — G8929 Other chronic pain: Secondary | ICD-10-CM | POA: Diagnosis not present

## 2015-01-21 DIAGNOSIS — D696 Thrombocytopenia, unspecified: Secondary | ICD-10-CM

## 2015-01-21 DIAGNOSIS — M545 Low back pain: Secondary | ICD-10-CM | POA: Diagnosis not present

## 2015-01-21 DIAGNOSIS — D472 Monoclonal gammopathy: Secondary | ICD-10-CM | POA: Insufficient documentation

## 2015-01-21 DIAGNOSIS — I10 Essential (primary) hypertension: Secondary | ICD-10-CM

## 2015-01-21 DIAGNOSIS — E0500 Thyrotoxicosis with diffuse goiter without thyrotoxic crisis or storm: Secondary | ICD-10-CM | POA: Diagnosis not present

## 2015-01-21 NOTE — Telephone Encounter (Signed)
Pt confirmed labs/ov per 03/17 POF, gave pt AVS and Calendar..... KJ

## 2015-01-21 NOTE — Progress Notes (Signed)
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 male is here because of abnormal SPEP and suspicious bone lesion on the CT scan.  He had a fall after he tripped and his face hit a wall on the super bowl night on 12/14/2014. He had a significant bleeding on the face and neck pain and was brought to emergency room for further evaluation by 911. CT scan of head and the neck was obtained which showed soft tissue laceration overlying the right orbit, no fracture. The skin incidentally found scattered small lucencies within the visualized osseous structures, with a 6 mm lucency noticed at the C6. He subsequently follow-up with his primary care physician, who ordered the SPEP which showed a restricted band consistent with monoclonal protein, 0.27 g/dl, the quantitative immunoglobulin IgG, IgA and IgM were within normal limits. He developed neck pain which radiate to bilateral shoulders after the accident, this was further evaluated by cervical and lumbar MRI without contrast. The MRI showed diffuse degenerative changes. The low-density lesion in the C5 vertebral body on the CT wasn't not seen in the MRI. His neck pain radiated to b/l shoulders has resolved now.   He had L4-5 disck back surgery 15 years ago in [place], and he has some chronic back pain since then. It has been getting worse in the past few years. He takes Percocet as needed for the back pain. He otherwise feels well, denies any other pain. He works full-time, and remains to be physically active. He denies any fever, chills, or night sweats. No recent weight loss.   MEDICAL HISTORY:  Past Medical History  Diagnosis Date  .  HYPERTHYROIDISM 01/25/2010  . HYPERTENSION 01/25/2010  . ALLERGIC RHINITIS 01/25/2010  . Vitiligo   . Obesity   . Pericarditis 1980's  . 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  . Impaired glucose tolerance   . OSA (obstructive sleep apnea)     Uses CPAP nightly  . Migraines   . Hx of echocardiogram     a.  Echo 02/03/13:  Mild LVH, mild focal basal septal hypertrophy, EF 50-55%, Gr 1 DD, mild LAE  . Hx of cardiovascular stress test     a. Lexiscan Myoview 02/06/13:  EF 59%, no ischemia or scar    SURGICAL HISTORY: Past Surgical History  Procedure Laterality Date  . Hernia repair      umbilical  . Lumbar laminectomy    . Inguinal hernia repair      SOCIAL HISTORY: History   Social History  . Marital Status: Married    Spouse Name: N/A  . Number of Children: 2  . Years of Education: N/A   Occupational History  . sales    Social History Main Topics  . Smoking status: Former Smoker -- 8 years    Quit date: 11/06/1966  . Smokeless tobacco: Not on file  . Alcohol Use: Yes     Comment: occasional - max 2x/week  . Drug Use: No  . Sexual Activity: Not on file   Other Topics Concern  . Not on file   Social History Narrative    FAMILY HISTORY: Family History  Problem Relation Age of Onset  .  Thyroid disease Sister     goiter  . Stroke Father     Passed away in his 81s  . Sudden death Brother     ? Drug use, no autopsy  . Throat cancer Brother     ALLERGIES:  is allergic to relpax.  MEDICATIONS:  Current Outpatient Prescriptions  Medication Sig Dispense Refill  . aspirin EC 81 MG tablet Take 1 tablet (81 mg total) by mouth daily.    . fluticasone (FLONASE) 50 MCG/ACT nasal spray Place 2 sprays into both nostrils as needed. (Patient taking differently: Place 2 sprays into both nostrils daily as needed for allergies. ) 16 g 2  . HYDROcodone-acetaminophen (NORCO/VICODIN) 5-325 MG per tablet Take 1 tablet by mouth every 4 (four) hours as needed. 8  tablet 0  . ibuprofen (ADVIL,MOTRIN) 200 MG tablet Take 600 mg by mouth every 6 (six) hours as needed for moderate pain.    Marland Kitchen losartan (COZAAR) 100 MG tablet Take 1 tablet (100 mg total) by mouth daily. 90 tablet 3  . metoprolol succinate (TOPROL-XL) 100 MG 24 hr tablet TAKE 1 TABLET DAILY (Patient taking differently: Take 100 mg by mouth daily. ) 15 tablet 0  . pravastatin (PRAVACHOL) 40 MG tablet TAKE 1 TABLET DAILY (Patient taking differently: Take 40 mg by mouth daily. TAKE 1 TABLET DAILY) 15 tablet 0  . sildenafil (VIAGRA) 100 MG tablet Take 1 tablet (100 mg total) by mouth as needed for erectile dysfunction. 20 tablet 1  . cyclobenzaprine (FLEXERIL) 5 MG tablet Take 1 tablet (5 mg total) by mouth 3 (three) times daily as needed for muscle spasms. (Patient not taking: Reported on 01/08/2015) 20 tablet 0  . methocarbamol (ROBAXIN) 500 MG tablet   0  . oxyCODONE-acetaminophen (PERCOCET/ROXICET) 5-325 MG per tablet Take 1-2 tablets by mouth every 6 (six) hours as needed for severe pain. (Patient not taking: Reported on 01/08/2015) 15 tablet 0  . traMADol (ULTRAM) 50 MG tablet Take 50 mg by mouth every 6 (six) hours as needed for severe pain.    Marland Kitchen zolpidem (AMBIEN) 5 MG tablet take 1 tablet by mouth at bedtime (Patient not taking: Reported on 01/21/2015) 30 tablet 0   No current facility-administered medications for this visit.    REVIEW OF SYSTEMS:   Constitutional: Denies fevers, chills or abnormal night sweats Eyes: Denies blurriness of vision, double vision or watery eyes Ears, nose, mouth, throat, and face: Denies mucositis or sore throat Respiratory: Denies cough, dyspnea or wheezes Cardiovascular: Denies palpitation, chest discomfort or lower extremity swelling Gastrointestinal:  Denies nausea, heartburn or change in bowel habits Skin: Denies abnormal skin rashes Lymphatics: Denies new lymphadenopathy or easy bruising Neurological:Denies numbness, tingling or new  weaknesses Behavioral/Psych: Mood is stable, no new changes  All other systems were reviewed with the patient and are negative.  PHYSICAL EXAMINATION: ECOG PERFORMANCE STATUS: 1 - Symptomatic but completely ambulatory  Filed Vitals:   01/21/15 1434  BP: 149/82  Pulse: 76  Temp: 98.4 F (36.9 C)  Resp: 18   Filed Weights   01/21/15 1434  Weight: 255 lb 8 oz (115.894 kg)    GENERAL:alert, no distress and comfortable SKIN: skin color, texture, turgor are normal, no rashes or significant lesions EYES: normal, conjunctiva are pink and non-injected, sclera clear OROPHARYNX:no exudate, no erythema and lips, buccal mucosa, and tongue normal  NECK: supple, thyroid normal size, non-tender, without nodularity LYMPH:  no palpable lymphadenopathy in the cervical, axillary or inguinal LUNGS: clear to auscultation and percussion  with normal breathing effort HEART: regular rate & rhythm and no murmurs and no lower extremity edema ABDOMEN:abdomen soft, non-tender and normal bowel sounds Musculoskeletal:no cyanosis of digits and no clubbing  PSYCH: alert & oriented x 3 with fluent speech NEURO: no focal motor/sensory deficits  LABORATORY DATA:  I have reviewed the data as listed CBC Latest Ref Rng 12/17/2014 12/14/2014 04/27/2014  WBC 4.0 - 10.5 K/uL 6.1 5.8 5.7  Hemoglobin 13.0 - 17.0 g/dL 14.1 14.6 14.1  Hematocrit 39.0 - 52.0 % 41.8 43.2 42.0  Platelets 150.0 - 400.0 K/uL 147.0(L) 144(L) 131.0(L)    CMP Latest Ref Rng 12/17/2014 12/14/2014 04/27/2014  Glucose 70 - 99 mg/dL 99 105(H) 110(H)  BUN 6 - 23 mg/dL '17 13 19  ' Creatinine 0.40 - 1.50 mg/dL 0.96 1.02 1.1  Sodium 135 - 145 mEq/L 140 141 141  Potassium 3.5 - 5.1 mEq/L 3.9 3.8 4.0  Chloride 96 - 112 mEq/L 106 107 106  CO2 19 - 32 mEq/L '28 27 26  ' Calcium 8.4 - 10.5 mg/dL 9.5 9.2 9.3  Total Protein 6.0 - 8.3 g/dL 7.7 8.0 7.2  Total Bilirubin 0.2 - 1.2 mg/dL 0.8 0.5 0.9  Alkaline Phos 39 - 117 U/L 50 62 44  AST 0 - 37 U/L '20 31 23  ' ALT  0 - 53 U/L '14 18 18     ' RADIOGRAPHIC STUDIES: I have personally reviewed the radiological images as listed and agreed with the findings in the report. Mr Cervical Spine Wo Contrast 01/14/2015    IMPRESSION: Negative for cervical spine fracture  Moderate cervical disc degeneration and spondylosis.  C5 vertebral lesion on the left on CT is difficult to visualize on MRI however no worrisome lesion is seen in this area.   Electronically Signed   By: Franchot Gallo M.D.   On: 01/14/2015 08:04   Mr Lumbar Spine Wo Contrast 01/14/2015    IMPRESSION: Extra foraminal disc contacts the exited left L2 root.  Moderate to moderately severe bilateral foraminal narrowing, worse on the right at L3-4 is due to disc with endplate spurring and facet degenerative disease.  Advanced facet arthropathy at L5-S1 results in 0.4 cm anterolisthesis. There is left much worse than right lateral recess narrowing at this level and moderate to moderately severe bilateral foraminal narrowing.  Moderate to moderately severe bilateral foraminal narrowing at L4-5 appears worse on the right. The central canal and right lateral recess are also mildly narrowed at this level.   Electronically Signed   By: Inge Rise M.D.   On: 01/14/2015 10:02   Dg Finger Index Right  01/08/2015   CLINICAL DATA:  Shut right second finger in garage door, with nailbed discoloration and pain. Initial encounter.  EXAM: RIGHT INDEX FINGER 2+V  COMPARISON:  None.  FINDINGS: There is no evidence of fracture or dislocation. Visualized joint spaces are preserved. The known soft tissue injury is not well characterized on radiograph. No radiopaque foreign bodies are seen.  IMPRESSION: No evidence of fracture or dislocation.   Electronically Signed   By: Garald Balding M.D.   On: 01/08/2015 01:00    ASSESSMENT & PLAN:  69 year old African-American male, with past medical history of Graves' disease, vitiligo, hypertension, dyslipidemia, was incidentally 2 found a  small hypodense lesion in C5 on CT scan. Lab work reviewed a monoclonal paraprotein.  1. 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  -His CT scan revealed a small hyper lytic lesion in the C5 vertebral body however was not obvious on the MRI scan. -I think a multiple myeloma is less likely, but needs to be ruled out. -I'll check serum light chain level, 24-hour urine protein electrophoresis with immunofixation, and light chain levels. -Bone survey -I recommend a bone marrow biopsy. The risks of the procedure was discussed with patient. He agrees to proceed. I'll arrange a biopsy through interventional radiology.  2. Mild thrombocytopenia -The platelet count has been in the range of 130 to 153 in the past 2 years, stable overall. -He does have other autoimmune disease including Graves' disease and vitiligo -Possible ITP. He is going to have a bone marrow biopsy to ruled out multiple myeloma. Giving the mild some cytopenia, I do not think further workup workup is needed.  3. Hypertension, dyslipidemia, spinal degenerative changes, chronic back pain -He'll continue follow-up with his primary care physician  Plan -Labs today -Bone marrow biopsy by IR -Return to clinic in 4 weeks to discuss the above test results.  Orders Placed This Encounter  Procedures  . DG Bone Survey Met    Standing Status: Future     Number of Occurrences:      Standing Expiration Date: 03/22/2016    Order Specific Question:  Reason for Exam (SYMPTOM  OR DIAGNOSIS REQUIRED)    Answer:  MGUS, rule out lutic bone lesions    Order Specific Question:  Preferred imaging location?    Answer:  [facility]  . CT Biopsy    Standing Status: Future     Number of Occurrences:      Standing Expiration Date: 01/21/2016    Order Specific Question:  Lab orders  requested (DO NOT place separate lab orders, these will be automatically ordered during procedure specimen collection):    Answer:  Surgical Pathology    Order Specific Question:  Reason for Exam (SYMPTOM  OR DIAGNOSIS REQUIRED)    Answer:  MGUS, rule out MM    Order Specific Question:  Preferred imaging location?    Answer:  [facility]  . CBC & Diff and Retic    Standing Status: Future     Number of Occurrences:      Standing Expiration Date: 01/21/2016  . Kappa/lambda light chains    Standing Status: Future     Number of Occurrences:      Standing Expiration Date: 01/21/2016  . Beta 2 microglobulin    Standing Status: Future     Number of Occurrences:      Standing Expiration Date: 01/21/2016  . Lactate dehydrogenase (LDH) - [facility]    Standing Status: Future     Number of Occurrences:      Standing Expiration Date: 01/21/2016  . QIG  (Quant. immunoglobulins  - IgG, IgA, IgM)    Standing Status: Future     Number of Occurrences:      Standing Expiration Date: 01/21/2016  . 24 hour urine, UPEP (protein electrophoresis)    Standing Status: Future     Number of Occurrences:      Standing Expiration Date: 01/21/2016  . 24 hour urine, UIFE with Total Protein    Standing Status: Future     Number of Occurrences:      Standing Expiration Date: 01/21/2016  . 24 Hr Urine, Kappa/Lambda Light Chains    Standing Status: Future     Number of Occurrences:  Standing Expiration Date: 01/21/2016    All questions were answered. The patient knows to call the clinic with any problems, questions or concerns. I spent 55 minutes counseling the patient face to face. The total time spent in the appointment was 60 minutes and more than 50% was on counseling.     Truitt Merle, MD 01/21/2015 6:10 PM

## 2015-01-21 NOTE — Progress Notes (Signed)
Checked in new pt with no financial concerns. He has my card for any billing questions or concerns.

## 2015-02-04 ENCOUNTER — Other Ambulatory Visit: Payer: Self-pay | Admitting: Radiology

## 2015-02-04 DIAGNOSIS — G4733 Obstructive sleep apnea (adult) (pediatric): Secondary | ICD-10-CM | POA: Diagnosis not present

## 2015-02-05 ENCOUNTER — Ambulatory Visit: Payer: Medicare Other | Admitting: Cardiology

## 2015-02-08 ENCOUNTER — Ambulatory Visit (HOSPITAL_COMMUNITY)
Admission: RE | Admit: 2015-02-08 | Discharge: 2015-02-08 | Disposition: A | Discharge status: Home / Self Care | Payer: Medicare Other | Source: Ambulatory Visit | Attending: Hematology | Admitting: Hematology

## 2015-02-08 ENCOUNTER — Encounter (HOSPITAL_COMMUNITY): Payer: Self-pay

## 2015-02-08 ENCOUNTER — Ambulatory Visit (HOSPITAL_COMMUNITY): Payer: Medicare Other

## 2015-02-08 DIAGNOSIS — D472 Monoclonal gammopathy: Secondary | ICD-10-CM | POA: Diagnosis not present

## 2015-02-08 LAB — CBC WITH DIFFERENTIAL/PLATELET
Basophils Absolute: 0 10*3/uL (ref 0.0–0.1)
Basophils Relative: 0 % (ref 0–1)
Eosinophils Absolute: 0.1 10*3/uL (ref 0.0–0.7)
Eosinophils Relative: 2 % (ref 0–5)
HCT: 41.1 % (ref 39.0–52.0)
Hemoglobin: 13.5 g/dL (ref 13.0–17.0)
Lymphocytes Relative: 37 % (ref 12–46)
Lymphs Abs: 1.9 10*3/uL (ref 0.7–4.0)
MCH: 30.2 pg (ref 26.0–34.0)
MCHC: 32.8 g/dL (ref 30.0–36.0)
MCV: 91.9 fL (ref 78.0–100.0)
Monocytes Absolute: 0.4 10*3/uL (ref 0.1–1.0)
Monocytes Relative: 8 % (ref 3–12)
Neutro Abs: 2.7 10*3/uL (ref 1.7–7.7)
Neutrophils Relative %: 53 % (ref 43–77)
Platelets: 142 10*3/uL — ABNORMAL LOW (ref 150–400)
RBC: 4.47 MIL/uL (ref 4.22–5.81)
RDW: 13.2 % (ref 11.5–15.5)
WBC: 5.1 10*3/uL (ref 4.0–10.5)

## 2015-02-08 LAB — APTT: aPTT: 29 seconds (ref 24–37)

## 2015-02-08 LAB — PROTIME-INR
INR: 1.01 (ref 0.00–1.49)
Prothrombin Time: 13.4 seconds (ref 11.6–15.2)

## 2015-02-08 LAB — BONE MARROW EXAM

## 2015-02-08 MED ORDER — FENTANYL CITRATE 0.05 MG/ML IJ SOLN
INTRAMUSCULAR | Status: AC | PRN
  Administered 2015-02-08: 50 ug via INTRAVENOUS
  Administered 2015-02-08: 25 ug via INTRAVENOUS

## 2015-02-08 MED ORDER — MIDAZOLAM HCL 2 MG/2ML IJ SOLN
INTRAMUSCULAR | Status: AC | PRN
  Administered 2015-02-08 (×3): 1 mg via INTRAVENOUS

## 2015-02-08 MED ORDER — FENTANYL CITRATE 0.05 MG/ML IJ SOLN
INTRAMUSCULAR | Status: AC
  Filled 2015-02-08: qty 4

## 2015-02-08 MED ORDER — SODIUM CHLORIDE 0.9 % IV SOLN
INTRAVENOUS | Status: DC
Start: 2015-02-08 — End: 2015-02-09
  Administered 2015-02-08: 09:00:00 via INTRAVENOUS

## 2015-02-08 MED ORDER — MIDAZOLAM HCL 2 MG/2ML IJ SOLN
INTRAMUSCULAR | Status: AC
  Filled 2015-02-08: qty 6

## 2015-02-08 NOTE — Discharge Instructions (Signed)
Conscious Sedation °Sedation is the use of medicines to promote relaxation and relieve discomfort and anxiety. Conscious sedation is a type of sedation. Under conscious sedation you are less alert than normal but are still able to respond to instructions or stimulation. Conscious sedation is used during short medical and dental procedures. It is milder than deep sedation or general anesthesia and allows you to return to your regular activities sooner.  °LET YOUR HEALTH CARE PROVIDER KNOW ABOUT:  °· Any allergies you have. °· All medicines you are taking, including vitamins, herbs, eye drops, creams, and over-the-counter medicines. °· Use of steroids (by mouth or creams). °· Previous problems you or members of your family have had with the use of anesthetics. °· Any blood disorders you have. °· Previous surgeries you have had. °· Medical conditions you have. °· Possibility of pregnancy, if this applies. °· Use of cigarettes, alcohol, or illegal drugs. °RISKS AND COMPLICATIONS °Generally, this is a safe procedure. However, as with any procedure, problems can occur. Possible problems include: °· Oversedation. °· Trouble breathing on your own. You may need to have a breathing tube until you are awake and breathing on your own. °· Allergic reaction to any of the medicines used for the procedure. °BEFORE THE PROCEDURE °· You may have blood tests done. These tests can help show how well your kidneys and liver are working. They can also show how well your blood clots. °· A physical exam will be done.   °· Only take medicines as directed by your health care provider. You may need to stop taking medicines (such as blood thinners, aspirin, or nonsteroidal anti-inflammatory drugs) before the procedure.   °· Do not eat or drink at least 6 hours before the procedure or as directed by your health care provider. °· Arrange for a responsible adult, family member, or friend to take you home after the procedure. He or she should stay  with you for at least 24 hours after the procedure, until the medicine has worn off. °PROCEDURE  °· An intravenous (IV) catheter will be inserted into one of your veins. Medicine will be able to flow directly into your body through this catheter. You may be given medicine through this tube to help prevent pain and help you relax. °· The medical or dental procedure will be done. °AFTER THE PROCEDURE °· You will stay in a recovery area until the medicine has worn off. Your blood pressure and pulse will be checked.   °·  Depending on the procedure you had, you may be allowed to go home when you can tolerate liquids and your pain is under control. °Document Released: 07/18/2001 Document Revised: 10/28/2013 Document Reviewed: 06/30/2013 °ExitCare® Patient Information ©2015 [employer]. This information is not intended to replace advice given to you by your health care provider. Make sure you discuss any questions you have with your health care provider. °Bone Marrow Aspiration and Bone Biopsy °Examination of the bone marrow is a valuable test to diagnose blood disorders. A bone marrow biopsy takes a sample of bone and a small amount of fluid and cells from inside the bone. A bone marrow aspiration removes only the marrow. Bone marrow aspiration and bone biopsies are used to stage different disorders of the blood, such as leukemia. Staging will help your caregiver understand how far the disease has progressed.  °The tests are also useful in diagnosing: °· Fever of unknown origin (FUO). °· Bacterial infections and other widespread fungal infections. °· Cancers that have spread (metastasized) to the bone   marrow. °· Diseases that are characterized by a deficiency of an enzyme (storage diseases). This includes: °· Niemann-Pick disease. °· Gaucher disease. °PROCEDURE  °Sites used to get samples include:  °· Back of your hip bone (posterior iliac crest). °· Both aspiration and biopsy. °· Front of your hip bone (anterior iliac  crest). °· Both aspiration and biopsy. °· Breastbone (sternum). °· Aspiration from your breastbone (done only in adults). This method is rarely used. °When you get a hip bone aspiration: °· You are placed lying on your side with the upper knee brought up and flexed with the lower leg straight. °· The site is prepared, cleaned with an antiseptic scrub, and draped. This keeps the biopsy area clean. °· The skin and the area down to the lining of the bone (periosteum) are made numb with a local anesthetic. °· The bone marrow aspiration needle is inserted. You will feel pressure on your bone. °· Once inside the marrow cavity, a sample of bone marrow is sucked out (aspirated) for pathology slides. °· The material collected for bone marrow slides is processed immediately by a technologist. °· The technician selects the marrow particles to make the slides for pathology. °· The marrow aspiration needle is removed. Then pressure is applied to the site with gauze until bleeding has stopped. °Following an aspiration, a bone marrow biopsy may be performed as well. The technique for this is very similar. A dressing is then applied.  °RISKS AND COMPLICATIONS °· The main complications of a bone marrow aspiration and biopsy include infection and bleeding. °· Complications are uncommon. The procedure may not be performed in patients with bleeding tendencies. °· A very rare complication from the procedure is injury to the heart during a breastbone (sternal) marrow aspiration. Only bone marrow aspirations are performed in this area. °· Long-lasting pain at the site of the bone marrow aspiration and biopsy is uncommon. °Your caregiver will let you know when you are to get your results and will discuss them with you. You may make an appointment with your caregiver to find out the results. Do not assume everything is normal if you have not heard from your caregiver or the medical facility. It is important for you to follow up on all of  your test results. °Document Released: 10/26/2004 Document Revised: 01/15/2012 Document Reviewed: 10/20/2008 °ExitCare® Patient Information ©2015 [employer]. This information is not intended to replace advice given to you by your health care provider. Make sure you discuss any questions you have with your health care provider. °Bone Marrow Aspiration, Bone Marrow Biopsy °Care After °Read the instructions outlined below and refer to this sheet in the next few weeks. These discharge instructions provide you with general information on caring for yourself after you leave the hospital. Your caregiver may also give you specific instructions. While your treatment has been planned according to the most current medical practices available, unavoidable complications occasionally occur. If you have any problems or questions after discharge, call your caregiver. °FINDING OUT THE RESULTS OF YOUR TEST °Not all test results are available during your visit. If your test results are not back during the visit, make an appointment with your caregiver to find out the results. Do not assume everything is normal if you have not heard from your caregiver or the medical facility. It is important for you to follow up on all of your test results.  °HOME CARE INSTRUCTIONS  °You have had sedation and may be sleepy or dizzy. Your thinking may not   be as clear as usual. For the next 24 hours: °· Only take over-the-counter or prescription medicines for pain, discomfort, and or fever as directed by your caregiver. °· Do not drink alcohol. °· Do not smoke. °· Do not drive. °· Do not make important legal decisions. °· Do not operate heavy machinery. °· Do not care for small children by yourself. °· Keep your dressing clean and dry. You may replace dressing with a bandage after 24 hours. °· You may take a bath or shower after 24 hours. °· Use an ice pack for 20 minutes every 2 hours while awake for pain as needed. °SEEK MEDICAL CARE IF:  °· There  is redness, swelling, or increasing pain at the biopsy site. °· There is pus coming from the biopsy site. °· There is drainage from a biopsy site lasting longer than one day. °· An unexplained oral temperature above 102° F (38.9° C) develops. °SEEK IMMEDIATE MEDICAL CARE IF:  °· You develop a rash. °· You have difficulty breathing. °· You develop any reaction or side effects to medications given. °Document Released: 05/12/2005 Document Revised: 01/15/2012 Document Reviewed: 10/20/2008 °ExitCare® Patient Information ©2015 [employer]. This information is not intended to replace advice given to you by your health care provider. Make sure you discuss any questions you have with your health care provider. ° °

## 2015-02-08 NOTE — Procedures (Signed)
BM Bx R iliac No comp

## 2015-02-08 NOTE — H&P (Signed)
Chief Complaint: "I'm here for a bone marrow biopsy"  Referring Physician(s): Feng,Y  History of Present Illness: Gordon Phillips is a 69 y.o. male with history of MGUS, mild thrombocytopenia and subtle lucencies noted in skull, cervical spine and left femoral greater trochanter on bone scan. He presents today for CT guided bone marrow biopsy for further evaluation.   Past Medical History  Diagnosis Date  . HYPERTHYROIDISM 01/25/2010  . HYPERTENSION 01/25/2010  . ALLERGIC RHINITIS 01/25/2010  . Vitiligo   . Obesity   . Pericarditis 1980's  . 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  . Impaired glucose tolerance   . OSA (obstructive sleep apnea)     Uses CPAP nightly  . Migraines   . Hx of echocardiogram     a.  Echo 02/03/13:  Mild LVH, mild focal basal septal hypertrophy, EF 50-55%, Gr 1 DD, mild LAE  . Hx of cardiovascular stress test     a. Lexiscan Myoview 02/06/13:  EF 59%, no ischemia or scar    Past Surgical History  Procedure Laterality Date  . Hernia repair      umbilical  . Lumbar laminectomy    . Inguinal hernia repair      Allergies: Relpax  Medications: Prior to Admission medications   Medication Sig Start Date End Date Taking? Authorizing Provider  aspirin EC 81 MG tablet Take 1 tablet (81 mg total) by mouth daily. 02/03/13   Barton Dubois, MD  cyclobenzaprine (FLEXERIL) 5 MG tablet Take 1 tablet (5 mg total) by mouth 3 (three) times daily as needed for muscle spasms. Patient not taking: Reported on 01/08/2015 12/14/14   Alfonzo Beers, MD  fluticasone [facility]) 50 MCG/ACT nasal spray Place 2 sprays into both nostrils as needed. Patient taking differently: Place 2 sprays into both nostrils daily as needed for allergies.  10/29/14 02/16/16  Marletta Lor, MD  HYDROcodone-acetaminophen (NORCO/VICODIN) 5-325 MG per tablet Take 1 tablet by mouth every 4 (four) hours as needed. 01/08/15   Charlann Lange, PA-C  ibuprofen (ADVIL,MOTRIN) 200 MG tablet Take  600 mg by mouth every 6 (six) hours as needed for moderate pain.    Historical Provider, MD  losartan (COZAAR) 100 MG tablet Take 1 tablet (100 mg total) by mouth daily. 10/13/14   Marletta Lor, MD  methocarbamol (ROBAXIN) 500 MG tablet  12/28/14   Historical Provider, MD  metoprolol succinate (TOPROL-XL) 100 MG 24 hr tablet TAKE 1 TABLET DAILY Patient taking differently: Take 100 mg by mouth daily.  10/21/14   Marletta Lor, MD  oxyCODONE-acetaminophen (PERCOCET/ROXICET) 5-325 MG per tablet Take 1-2 tablets by mouth every 6 (six) hours as needed for severe pain. Patient not taking: Reported on 01/08/2015 12/14/14   Alfonzo Beers, MD  pravastatin (PRAVACHOL) 40 MG tablet TAKE 1 TABLET DAILY Patient taking differently: Take 40 mg by mouth daily. TAKE 1 TABLET DAILY 10/21/14   Marletta Lor, MD  sildenafil (VIAGRA) 100 MG tablet Take 1 tablet (100 mg total) by mouth as needed for erectile dysfunction. 05/29/11 01/21/15  Marletta Lor, MD  traMADol (ULTRAM) 50 MG tablet Take 50 mg by mouth every 6 (six) hours as needed for severe pain.    Historical Provider, MD  zolpidem (AMBIEN) 5 MG tablet take 1 tablet by mouth at bedtime Patient not taking: Reported on 01/21/2015 12/28/14   Tanda Rockers, MD    Family History  Problem Relation Age of Onset  .  Thyroid disease Sister     goiter  . Stroke Father     Passed away in his 32s  . Sudden death Brother     ? Drug use, no autopsy  . Throat cancer Brother     History   Social History  . Marital Status: Married    Spouse Name: N/A  . Number of Children: 2  . Years of Education: N/A   Occupational History  . sales    Social History Main Topics  . Smoking status: Former Smoker -- 8 years    Quit date: 11/06/1966  . Smokeless tobacco: Not on file  . Alcohol Use: Yes     Comment: occasional - max 2x/week  . Drug Use: No  . Sexual Activity: Not on file   Other Topics Concern  . Not on file   Social History Narrative       Review of Systems  Constitutional: Negative for fever and chills.  HENT:       Some intermittent soreness, bleeding from left nostril  Respiratory: Negative for cough and shortness of breath.   Cardiovascular: Negative for chest pain.  Gastrointestinal: Negative for nausea, vomiting, abdominal pain and blood in stool.  Genitourinary: Negative for dysuria and hematuria.  Musculoskeletal: Positive for back pain.  Neurological: Negative for headaches.  Hematological: Does not bruise/bleed easily.    Vital Signs: BP 140/79  HR 68  R 18  TEMP 97.8  O2 SATS 97% RA Physical Exam  Constitutional: He is oriented to person, place, and time. He appears well-developed and well-nourished.  Cardiovascular: Normal rate and regular rhythm.   Pulmonary/Chest: Effort normal and breath sounds normal.  Abdominal: Soft. Bowel sounds are normal. There is no tenderness.  Musculoskeletal: Normal range of motion. He exhibits no edema.  Neurological: He is alert and oriented to person, place, and time.    Imaging: Mr Cervical Spine Wo Contrast  01/14/2015   CLINICAL DATA:  Fall 12/14/2014.  Neck pain.  Follow-up lesion at C6  EXAM: MRI CERVICAL SPINE WITHOUT CONTRAST  TECHNIQUE: Multiplanar, multisequence MR imaging of the cervical spine was performed. No intravenous contrast was administered.  COMPARISON:  CT cervical spine 12/14/2014  FINDINGS: Normal cervical alignment with straightening of the cervical lordosis. Negative for fracture. Spinal cord signal is normal. Craniocervical junction is normal.  Review of the recent CT reveals a 6 mm low-density lesion in the C5 vertebral body on the left. This is difficult to see on MRI but there is no evidence of hemangioma or mass lesion in the area. No significant fluid-filled cyst is present. This may represent heterogeneous bone marrow related to degenerative change on the CT.  C2-3:  Mild degenerative change without stenosis  C3-4: Small central disc  protrusion. Diffuse uncinate spurring left greater than right with left foraminal encroachment. No cord deformity.  C4-5: Disc degeneration and spondylosis. Small left-sided disc protrusion without cord deformity. Mild facet hypertrophy bilaterally. Mild foraminal narrowing bilaterally  C5-6: Moderate disc degeneration and spondylosis. Mild spinal and foraminal stenosis bilaterally.  C6-7: Disc degeneration and spondylosis. Mild spinal stenosis and mild foraminal stenosis bilaterally.  C7-T1: Moderate facet hypertrophy bilaterally. Mild disc degeneration. Mild spinal stenosis. Moderate foraminal encroachment bilaterally due to facet hypertrophy.  IMPRESSION: Negative for cervical spine fracture  Moderate cervical disc degeneration and spondylosis.  C5 vertebral lesion on the left on CT is difficult to visualize on MRI however no worrisome lesion is seen in this area.   Electronically Signed   By:  Franchot Gallo M.D.   On: 01/14/2015 08:04   Mr Lumbar Spine Wo Contrast  01/14/2015   CLINICAL DATA:  Status post fall 12/14/2014. Low back pain radiating into both legs.  EXAM: MRI LUMBAR SPINE WITHOUT CONTRAST  TECHNIQUE: Multiplanar, multisequence MR imaging of the lumbar spine was performed. No intravenous contrast was administered.  COMPARISON:  None.  FINDINGS: No fracture is identified. Marrow signal is heterogeneous without worrisome marrow lesion seen. 0.4 cm anterolisthesis L5 on S1 is due to facet arthropathy. The conus medullaris is normal in signal and position. Imaged intra-abdominal contents are unremarkable.  The T12-L1 level is imaged in the sagittal plane only. The central canal and foramina appear open with mild disc bulging noted.  L1-2: There is a shallow disc bulge and very small down turning right paracentral protrusion. A very small left lateral recess protrusion is also identified. The central canal is only mildly narrowed. The foramina appear open.  L2-3: There is shallow disc bulge and extra  foraminal protrusion on the left. The central canal and foramina appear open. Disc contacts the exited left L2 root.  L3-4: There is a shallow disc bulge with endplate spurring. Facet arthropathy is noted. The central canal is open. Moderate to moderately severe foraminal narrowing is worse on the right.  L4-5: Right worse than left facet degenerative disease is identified. A shallow disc bulge is eccentrically prominent to the right. The central canal and right lateral recess are mildly narrowed. Moderate to moderately severe foraminal narrowing appears worse on the right.  L5-S1: Advanced bilateral facet degenerative change is present. The disc is uncovered with a bulge. Mild to moderate central canal stenosis is present. There is left much worse than right lateral recess narrowing. The descending left S1 root appears compressed. Moderate to moderately severe bilateral foraminal narrowing is identified.  IMPRESSION: Extra foraminal disc contacts the exited left L2 root.  Moderate to moderately severe bilateral foraminal narrowing, worse on the right at L3-4 is due to disc with endplate spurring and facet degenerative disease.  Advanced facet arthropathy at L5-S1 results in 0.4 cm anterolisthesis. There is left much worse than right lateral recess narrowing at this level and moderate to moderately severe bilateral foraminal narrowing.  Moderate to moderately severe bilateral foraminal narrowing at L4-5 appears worse on the right. The central canal and right lateral recess are also mildly narrowed at this level.   Electronically Signed   By: Inge Rise M.D.   On: 01/14/2015 10:02   Dg Bone Survey Met  02/08/2015   CLINICAL DATA:  Monoclonal gammopathy.  EXAM: METASTATIC BONE SURVEY  COMPARISON:  None.  FINDINGS: Subtle nonspecific lucencies are noted in the skull, these could represent prominent vascular channels, myeloma cannot be excluded. Subtle lucencies are noted in the cervical spine. These could be  degenerative however myelomatous involvement of the cervical spine cannot be excluded. Small lucency noted in the left greater trochanter.  IMPRESSION: Subtle lucencies are noted in the skull, cervical spine, and left femoral greater trochanter. Very subtle changes of myeloma cannot be completely excluded.   Electronically Signed   By: Marcello Moores  Register   On: 02/08/2015 08:43    Labs:  CBC:  Recent Labs  04/27/14 1110 12/14/14 0350 12/17/14 1702  WBC 5.7 5.8 6.1  HGB 14.1 14.6 14.1  HCT 42.0 43.2 41.8  PLT 131.0* 144* 147.0*    COAGS: No results for input(s): INR, APTT in the last 8760 hours.  BMP:  Recent Labs  04/27/14  1110 12/14/14 0350 12/17/14 1702  NA 141 141 140  K 4.0 3.8 3.9  CL 106 107 106  CO2 '26 27 28  ' GLUCOSE 110* 105* 99  BUN '19 13 17  ' CALCIUM 9.3 9.2 9.5  CREATININE 1.1 1.02 0.96  GFRNONAA  --  74*  --   GFRAA  --  86*  --     LIVER FUNCTION TESTS:  Recent Labs  04/27/14 1110 12/14/14 0350 12/17/14 1702  BILITOT 0.9 0.5 0.8  AST '23 31 20  ' ALT '18 18 14  ' ALKPHOS 44 62 50  PROT 7.2 8.0 7.7  ALBUMIN 4.1 4.3 4.1    TUMOR MARKERS: No results for input(s): AFPTM, CEA, CA199, CHROMGRNA in the last 8760 hours.  Assessment and Plan: Gordon Phillips is a 69 y.o. male with history of MGUS, mild thrombocytopenia and subtle lucencies noted in skull, cervical spine and left femoral greater trochanter on bone scan. He presents today for CT guided bone marrow biopsy for further evaluation.Risks and benefits discussed with the patient/wife including, but not limited to bleeding, infection, damage to adjacent structures or low yield requiring additional tests. All of the patient's questions were answered, patient is agreeable to proceed. Consent signed and in chart.     Signed: Autumn Messing 02/08/2015, 9:24 AM   I spent a total of 20 minutes face to face in clinical consultation, greater than 50% of which was counseling/coordinating care for CT guided  bone marrow biopsy

## 2015-02-15 LAB — CHROMOSOME ANALYSIS, BONE MARROW

## 2015-02-22 ENCOUNTER — Encounter (HOSPITAL_COMMUNITY): Payer: Self-pay

## 2015-02-22 ENCOUNTER — Telehealth: Payer: Self-pay | Admitting: Cardiology

## 2015-02-22 ENCOUNTER — Encounter: Payer: Self-pay | Admitting: Hematology

## 2015-02-22 ENCOUNTER — Ambulatory Visit (HOSPITAL_BASED_OUTPATIENT_CLINIC_OR_DEPARTMENT_OTHER): Payer: Medicare Other | Admitting: Hematology

## 2015-02-22 ENCOUNTER — Other Ambulatory Visit (HOSPITAL_BASED_OUTPATIENT_CLINIC_OR_DEPARTMENT_OTHER): Payer: Medicare Other

## 2015-02-22 ENCOUNTER — Telehealth: Payer: Self-pay | Admitting: Hematology

## 2015-02-22 VITALS — BP 148/78 | HR 62 | Temp 97.9°F | Resp 18 | Ht 74.0 in | Wt 257.1 lb

## 2015-02-22 DIAGNOSIS — L80 Vitiligo: Secondary | ICD-10-CM

## 2015-02-22 DIAGNOSIS — I10 Essential (primary) hypertension: Secondary | ICD-10-CM

## 2015-02-22 DIAGNOSIS — E0500 Thyrotoxicosis with diffuse goiter without thyrotoxic crisis or storm: Secondary | ICD-10-CM

## 2015-02-22 DIAGNOSIS — M545 Low back pain: Secondary | ICD-10-CM

## 2015-02-22 DIAGNOSIS — E785 Hyperlipidemia, unspecified: Secondary | ICD-10-CM

## 2015-02-22 DIAGNOSIS — G8929 Other chronic pain: Secondary | ICD-10-CM

## 2015-02-22 DIAGNOSIS — D696 Thrombocytopenia, unspecified: Secondary | ICD-10-CM

## 2015-02-22 DIAGNOSIS — D472 Monoclonal gammopathy: Secondary | ICD-10-CM

## 2015-02-22 LAB — CBC & DIFF AND RETIC
BASO%: 0.4 % (ref 0.0–2.0)
Basophils Absolute: 0 10*3/uL (ref 0.0–0.1)
EOS%: 2.6 % (ref 0.0–7.0)
Eosinophils Absolute: 0.1 10*3/uL (ref 0.0–0.5)
HCT: 43.3 % (ref 38.4–49.9)
HGB: 14.5 g/dL (ref 13.0–17.1)
Immature Retic Fract: 5.3 % (ref 3.00–10.60)
LYMPH%: 38 % (ref 14.0–49.0)
MCH: 30.5 pg (ref 27.2–33.4)
MCHC: 33.5 g/dL (ref 32.0–36.0)
MCV: 91.2 fL (ref 79.3–98.0)
MONO#: 0.3 10*3/uL (ref 0.1–0.9)
MONO%: 5.6 % (ref 0.0–14.0)
NEUT#: 2.8 10*3/uL (ref 1.5–6.5)
NEUT%: 53.4 % (ref 39.0–75.0)
Platelets: 131 10*3/uL — ABNORMAL LOW (ref 140–400)
RBC: 4.75 10*6/uL (ref 4.20–5.82)
RDW: 13.2 % (ref 11.0–14.6)
Retic %: 1.58 % (ref 0.80–1.80)
Retic Ct Abs: 75.05 10*3/uL (ref 34.80–93.90)
WBC: 5.3 10*3/uL (ref 4.0–10.3)
lymph#: 2 10*3/uL (ref 0.9–3.3)

## 2015-02-22 LAB — LACTATE DEHYDROGENASE (CC13): LDH: 183 U/L (ref 125–245)

## 2015-02-22 NOTE — Telephone Encounter (Signed)
Pt states he had serious episode of palpitations last night. Pt states he got lightheaded, tightness around head and was clammy. Pt states that it lasted about 20 minutes and resolved without any intervention.  Pt states he had just eaten dinner, had Lipton peach tea, and sorbet. Pt states that he thinks his palpitations may have been related to too much blood sugar.  Pt denies any symptoms at this time.  He states his BP is 148/78, pulse 62.

## 2015-02-22 NOTE — Telephone Encounter (Signed)
Patient c/o Palpitations:  High priority if patient c/o lightheadedness and shortness of breath.  1. How long have you been having palpitations? Within 24 Hours  2. Are you currently experiencing lightheadedness and shortness of breath?  No  3. Have you checked your BP and heart rate? (document readings) No   4. Are you experiencing any other symptoms? Clammy, Flushed in the face, sweating really bad, lightheaded, Heart raced profusely. He states that these symptoms came on after he ate dinner.   Comments: Pt called states that he had an episode last night, Heart was racing among other symptoms previously listed. Please call back to discuss

## 2015-02-22 NOTE — Telephone Encounter (Signed)
Reviewed with Terri Skains recommended offer pt appt for office visit 02/24/15 at [place] or 03/02/15 at [place] for pt.

## 2015-02-22 NOTE — Telephone Encounter (Signed)
Pt states he is unable to come 02/24/15 at 10AM, will schedule pt 03/02/15 at 10AM.

## 2015-02-22 NOTE — Telephone Encounter (Signed)
gave and printed appt sched and avs for opt for July

## 2015-02-22 NOTE — Progress Notes (Signed)
[name]  [phone] Fax:([phone]) [place] Note   Patient Care Team: Marletta Lor, MD as PCP - General 02/22/2015  CHIEF COMPLAINTS:  MGUS  Referring physician:[email or web address],PETER Pilar Plate, MD   HISTORY OF PRESENTING ILLNESS:  Gordon Phillips 69 y.o. male is here because of abnormal SPEP and suspicious bone lesion on the CT scan.  He had a fall after he tripped and his face hit a wall on the super bowl night on 12/14/2014. He had a significant bleeding on the face and neck pain and was brought to emergency room for further evaluation by 911. CT scan of head and the neck was obtained which showed soft tissue laceration overlying the right orbit, no fracture. The skin incidentally found scattered small lucencies within the visualized osseous structures, with a 6 mm lucency noticed at the C6. He subsequently follow-up with his primary care physician, who ordered the SPEP which showed a restricted band consistent with monoclonal protein, 0.27 g/dl, the quantitative immunoglobulin IgG, IgA and IgM were within normal limits. He developed neck pain which radiate to bilateral shoulders after the accident, this was further evaluated by cervical and lumbar MRI without contrast. The MRI showed diffuse degenerative changes. The low-density lesion in the C5 vertebral body on the CT wasn't not seen in the MRI. His neck pain radiated to b/l shoulders has resolved now.   He had L4-5 disck back surgery 15 years ago in [place], and he has some chronic back pain since then. It has been getting worse in the past few years. He takes Percocet as needed for the back pain. He otherwise feels well, denies any other pain. He works full-time, and remains to be physically active. He denies any fever, chills, or night sweats. No recent weight loss.   Interim history: He had bone marrow on 4/4 and tolerated it well. He reports an episode of mid chest pain after meal last night,  with palpitation. It lasted for a few hours. He denies any shortness breasts, fever, or ductal cough or other new symptoms. He has chronic back pain, which is stable, and in no new pain or other new symptoms.  MEDICAL HISTORY:  Past Medical History  Diagnosis Date  . HYPERTHYROIDISM 01/25/2010  . HYPERTENSION 01/25/2010  . ALLERGIC RHINITIS 01/25/2010  . Vitiligo   . Obesity   . Pericarditis 1980's  . 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  . Impaired glucose tolerance   . OSA (obstructive sleep apnea)     Uses CPAP nightly  . Migraines   . Hx of echocardiogram     a.  Echo 02/03/13:  Mild LVH, mild focal basal septal hypertrophy, EF 50-55%, Gr 1 DD, mild LAE  . Hx of cardiovascular stress test     a. Lexiscan Myoview 02/06/13:  EF 59%, no ischemia or scar    SURGICAL HISTORY: Past Surgical History  Procedure Laterality Date  . Hernia repair      umbilical  . Lumbar laminectomy    . Inguinal hernia repair      SOCIAL HISTORY: History   Social History  . Marital Status: Married    Spouse Name: N/A  . Number of Children: 2  . Years of Education: N/A   Occupational History  . sales    Social History Main Topics  . Smoking status: Former Smoker -- 8 years    Quit date: 11/06/1966  . Smokeless tobacco: Not on file  .  Alcohol Use: Yes     Comment: occasional - max 2x/week  . Drug Use: No  . Sexual Activity: Not on file   Other Topics Concern  . Not on file   Social History Narrative    FAMILY HISTORY: Family History  Problem Relation Age of Onset  . Thyroid disease Sister     goiter  . Stroke Father     Passed away in his 30s  . Sudden death Brother     ? Drug use, no autopsy  . Throat cancer Brother     ALLERGIES:  is allergic to relpax.  MEDICATIONS:  Current Outpatient Prescriptions  Medication Sig Dispense Refill  . aspirin EC 81 MG tablet Take 1 tablet (81 mg total) by mouth daily.    . fluticasone (FLONASE) 50 MCG/ACT nasal spray  Place 2 sprays into both nostrils as needed. (Patient taking differently: Place 2 sprays into both nostrils daily as needed for allergies. ) 16 g 2  . losartan (COZAAR) 100 MG tablet Take 1 tablet (100 mg total) by mouth daily. 90 tablet 3  . metoprolol succinate (TOPROL-XL) 100 MG 24 hr tablet TAKE 1 TABLET DAILY (Patient taking differently: Take 100 mg by mouth daily. ) 15 tablet 0  . pravastatin (PRAVACHOL) 40 MG tablet TAKE 1 TABLET DAILY (Patient taking differently: Take 40 mg by mouth daily. TAKE 1 TABLET DAILY) 15 tablet 0  . sildenafil (VIAGRA) 100 MG tablet Take 100 mg by mouth daily as needed for erectile dysfunction (erectile dysfunction).    . Zinc Oxide 10 % OINT Apply 1 application topically daily as needed (nose sore).    Marland Kitchen zolpidem (AMBIEN) 5 MG tablet take 1 tablet by mouth at bedtime 30 tablet 0  . cyclobenzaprine (FLEXERIL) 5 MG tablet Take 1 tablet (5 mg total) by mouth 3 (three) times daily as needed for muscle spasms. (Patient not taking: Reported on 01/08/2015) 20 tablet 0  . HYDROcodone-acetaminophen (NORCO/VICODIN) 5-325 MG per tablet Take 1 tablet by mouth every 4 (four) hours as needed. (Patient not taking: Reported on 02/22/2015) 8 tablet 0  . oxyCODONE-acetaminophen (PERCOCET/ROXICET) 5-325 MG per tablet Take 1-2 tablets by mouth every 6 (six) hours as needed for severe pain. (Patient not taking: Reported on 01/08/2015) 15 tablet 0   No current facility-administered medications for this visit.    REVIEW OF SYSTEMS:   Constitutional: Denies fevers, chills or abnormal night sweats Eyes: Denies blurriness of vision, double vision or watery eyes Ears, nose, mouth, throat, and face: Denies mucositis or sore throat Respiratory: Denies cough, dyspnea or wheezes Cardiovascular: Denies palpitation, chest discomfort or lower extremity swelling Gastrointestinal:  Denies nausea, heartburn or change in bowel habits Skin: Denies abnormal skin rashes Lymphatics: Denies new  lymphadenopathy or easy bruising Neurological:Denies numbness, tingling or new weaknesses Behavioral/Psych: Mood is stable, no new changes  All other systems were reviewed with the patient and are negative.  PHYSICAL EXAMINATION: ECOG PERFORMANCE STATUS: 1 - Symptomatic but completely ambulatory  Filed Vitals:   02/22/15 1315  BP: 148/78  Pulse: 62  Temp: 97.9 F (36.6 C)  Resp: 18   Filed Weights   02/22/15 1315  Weight: 257 lb 1.6 oz (116.62 kg)    GENERAL:alert, no distress and comfortable SKIN: skin color, texture, turgor are normal, no rashes or significant lesions EYES: normal, conjunctiva are pink and non-injected, sclera clear OROPHARYNX:no exudate, no erythema and lips, buccal mucosa, and tongue normal  NECK: supple, thyroid normal size, non-tender, without nodularity LYMPH:  no palpable lymphadenopathy in the cervical, axillary or inguinal LUNGS: clear to auscultation and percussion with normal breathing effort HEART: regular rate & rhythm and no murmurs and no lower extremity edema ABDOMEN:abdomen soft, non-tender and normal bowel sounds Musculoskeletal:no cyanosis of digits and no clubbing  PSYCH: alert & oriented x 3 with fluent speech NEURO: no focal motor/sensory deficits  LABORATORY DATA:  I have reviewed the data as listed CBC Latest Ref Rng 02/22/2015 02/08/2015 12/17/2014  WBC 4.0 - 10.3 10e3/uL 5.3 5.1 6.1  Hemoglobin 13.0 - 17.1 g/dL 14.5 13.5 14.1  Hematocrit 38.4 - 49.9 % 43.3 41.1 41.8  Platelets 140 - 400 10e3/uL 131(L) 142(L) 147.0(L)    CMP Latest Ref Rng 12/17/2014 12/14/2014 04/27/2014  Glucose 70 - 99 mg/dL 99 105(H) 110(H)  BUN 6 - 23 mg/dL '17 13 19  ' Creatinine 0.40 - 1.50 mg/dL 0.96 1.02 1.1  Sodium 135 - 145 mEq/L 140 141 141  Potassium 3.5 - 5.1 mEq/L 3.9 3.8 4.0  Chloride 96 - 112 mEq/L 106 107 106  CO2 19 - 32 mEq/L '28 27 26  ' Calcium 8.4 - 10.5 mg/dL 9.5 9.2 9.3  Total Protein 6.0 - 8.3 g/dL 7.7 8.0 7.2  Total Bilirubin 0.2 - 1.2 mg/dL  0.8 0.5 0.9  Alkaline Phos 39 - 117 U/L 50 62 44  AST 0 - 37 U/L '20 31 23  ' ALT 0 - 53 U/L '14 18 18   ' PATHOLOGY REPORT  Bone Marrow, Aspirate,Biopsy, and Clot, iliac crest 02/08/2015 - HYPERCELLULAR BONE MARROW FOR AGE WITH TRILINEAGE HEMATOPOIESIS INCLUDING ABUNDANT MEGAKARYOCYTES. - 5% PLASMA CELLS. - SEE COMMENT. PERIPHERAL BLOOD: - NO SIGNIFICANT MORPHOLOGIC ABNORMALITIES. Diagnosis Note The bone marrow is hypercellular for age with trilineage hematopoiesis including abundant megakaryocytes, some of which display atypical features. The significance of latter finding is uncertain at this time and hence correlation with cytogenetic studies is recommended. The plasma cells represent 5% of all cells in the aspirate associated with kappa light chain excess. Given the relatively small number of plasma cells evaluated, the overall features are considered suggestive but not entirely definitive of plasma cell dyscrasia. Clinical and cytogenetic correlation is recommended. (BNS:ecj 02/10/2015)    RADIOGRAPHIC STUDIES: I have personally reviewed the radiological images as listed and agreed with the findings in the report. Mr Cervical Spine Wo Contrast 01/14/2015    IMPRESSION: Negative for cervical spine fracture  Moderate cervical disc degeneration and spondylosis.  C5 vertebral lesion on the left on CT is difficult to visualize on MRI however no worrisome lesion is seen in this area.   Electronically Signed   By: Franchot Gallo M.D.   On: 01/14/2015 08:04   Mr Lumbar Spine Wo Contrast 01/14/2015    IMPRESSION: Extra foraminal disc contacts the exited left L2 root.  Moderate to moderately severe bilateral foraminal narrowing, worse on the right at L3-4 is due to disc with endplate spurring and facet degenerative disease.  Advanced facet arthropathy at L5-S1 results in 0.4 cm anterolisthesis. There is left much worse than right lateral recess narrowing at this level and moderate to moderately severe  bilateral foraminal narrowing.  Moderate to moderately severe bilateral foraminal narrowing at L4-5 appears worse on the right. The central canal and right lateral recess are also mildly narrowed at this level.   Electronically Signed   By: Inge Rise M.D.   On: 01/14/2015 10:02    Bone survey 02/08/2015 IMPRESSION: Subtle lucencies are noted in the skull, cervical spine, and left femoral greater  trochanter. Very subtle changes of myeloma cannot be completely excluded.   ASSESSMENT & PLAN:  69 year old African-American male, with past medical history of Graves' disease, vitiligo, hypertension, dyslipidemia, was incidentally 2 found a small hypodense lesion in C5 on CT scan. Lab work reviewed a monoclonal paraprotein.  1. MGUS -His SPEP reveal a low-level of monoclonal paraprotein (0.27g/dl), immunoglobulin level were normal, he does not have anemia, renal dysfunction, hypercalcemia on the lab test.  -His CT scan revealed a small hyper lytic lesion in the C5 vertebral body however was not obvious on the MRI scan. -A bone survey showed subtle lucencies in multiple bones, indeterminate.  -His bone marrow biopsy showed 5% plasma cells, suggestive of plasma cell dyscrasia. Cytogenetics was normal. -We discussed the natural history of MGUS, and small possibility (1% every year) of developing multiple myeloma. I recommend routine follow-up every 3-6 months.    2. Mild thrombocytopenia -The platelet count has been in the range of 130 to 153 in the past 2 years, stable overall. -He does have other autoimmune disease including Graves' disease and vitiligo - His bone marrow biopsy showed abundant megakaryocytes, some of which display atypical features, the significance is uncertain. -We'll continue follow-up his CBC.  3. Hypertension, dyslipidemia, spinal degenerative changes, chronic back pain -He'll continue follow-up with his primary care physician  Plan -Return to clinic in 3 months  with lab 1 week before   All questions were answered. The patient knows to call the clinic with any problems, questions or concerns. I spent 25 minutes counseling the patient face to face. The total time spent in the appointment was 30 minutes and more than 50% was on counseling.     Truitt Merle, MD 02/22/2015 1:26 PM

## 2015-02-24 LAB — BETA 2 MICROGLOBULIN, SERUM: Beta-2 Microglobulin: 1.5 mg/L (ref <=2.51)

## 2015-02-24 LAB — KAPPA/LAMBDA LIGHT CHAINS
Kappa free light chain: 1.39 mg/dL (ref 0.33–1.94)
Kappa:Lambda Ratio: 1.96 — ABNORMAL HIGH (ref 0.26–1.65)
Lambda Free Lght Chn: 0.71 mg/dL (ref 0.57–2.63)

## 2015-02-24 LAB — IGG, IGA, IGM
IgA: 139 mg/dL (ref 68–379)
IgG (Immunoglobin G), Serum: 1430 mg/dL (ref 650–1600)
IgM, Serum: 61 mg/dL (ref 41–251)

## 2015-02-27 LAB — UIFE/LIGHT CHAINS/TP QN, 24-HR UR
Albumin, U: DETECTED
Alpha 1, Urine: DETECTED — AB
Alpha 2, Urine: DETECTED — AB
Beta, Urine: DETECTED — AB
Gamma Globulin, Urine: DETECTED — AB
Time: 24 hours
Total Protein, Urine-Ur/day: 126 mg/d (ref <150)
Total Protein, Urine: 9 mg/dL (ref 5–25)
Volume, Urine: 1400 mL

## 2015-02-27 LAB — UPEP/TP, 24-HR URINE
Collection Interval: 24 hours
Total Protein, Urine/Day: 126 mg/d — ABNORMAL HIGH (ref 50–100)
Total Protein, Urine: 9 mg/dL
Total Volume, Urine: 1400 mL

## 2015-02-27 LAB — 24 HR URINE,KAPPA/LAMBDA LIGHT CHAINS
24H Urine Volume: 1400 mL/24 h
Measured Kappa Chain: 0.79 mg/dL (ref <2.00)
Measured Lambda Chain: <0.4 mg/dL (ref <2.00)
Total Kappa Chain: 11.06 mg/24 h

## 2015-03-01 NOTE — Progress Notes (Addendum)
CARDIOLOGY OFFICE NOTE  Date:  03/02/2015    [place] Alert Date of Birth: 08-Jan-1946 Medical Record #[id number]  PCP:  Nyoka Cowden, MD  Cardiologist:  Aundra Dubin  Chief Complaint  Patient presents with  . Palpitations    Work in visit - seen for Dr. Aundra Dubin     History of Present Illness: Gordon Phillips is a 69 y.o. male who presents today for a work in visit. Seen for Dr. Aundra Dubin.   Has not been here since April of 2015 - saw Richardson Dopp PA at that time. Was doing ok at that visit.   He has a history of hx of HTN, OSA, remote pericarditis and migraine HAs. He presented with CP back in 2014 shortly after taking the Relpax (triptan). BP was 240/140. Of note, he had an old lacunar infarct in L basal ganglia on Head CT (patient was unaware of prior stroke). Echo 02/03/13: Mild LVH, mild focal basal septal hypertrophy, EF 50-55%, Gr 1 DD, mild LAE. CEs remained negative. He was placed on a statin.. In light of his remote stroke on CT and migraine HAs, OP neurology consultation was recommended. OP myoview was recommended as well. Lexiscan Myoview 02/06/13: EF 59%, no ischemia or scar.   Called last week with palpitations - could not come until today.   Comes back today. Here with his wife. He is here because of palpitations. He has noted tendency towards rapid heart beating. Last week had 3 spells - two after eating and one spell after sex. Notes rapid heart beat after eating - wife gave him ativan on 2 separate occasions just to calm him down. He was anxious. He felt very presyncopal. Very tired afterwards and with a sore chest. Last spell was Saturday - feels fine today.   Past Medical History  Diagnosis Date  . HYPERTHYROIDISM 01/25/2010  . HYPERTENSION 01/25/2010  . ALLERGIC RHINITIS 01/25/2010  . Vitiligo   . Obesity   . Pericarditis 1980's  . 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  . Impaired glucose tolerance   . OSA (obstructive sleep apnea)       Uses CPAP nightly  . Migraines   . Hx of echocardiogram     a.  Echo 02/03/13:  Mild LVH, mild focal basal septal hypertrophy, EF 50-55%, Gr 1 DD, mild LAE  . Hx of cardiovascular stress test     a. Lexiscan Myoview 02/06/13:  EF 59%, no ischemia or scar    Past Surgical History  Procedure Laterality Date  . Hernia repair      umbilical  . Lumbar laminectomy    . Inguinal hernia repair       Medications: Current Outpatient Prescriptions  Medication Sig Dispense Refill  . aspirin EC 81 MG tablet Take 1 tablet (81 mg total) by mouth daily.    . fluticasone (FLONASE) 50 MCG/ACT nasal spray Place 2 sprays into both nostrils as needed. (Patient taking differently: Place 2 sprays into both nostrils daily as needed for allergies. ) 16 g 2  . losartan (COZAAR) 100 MG tablet Take 1 tablet (100 mg total) by mouth daily. 90 tablet 3  . metoprolol succinate (TOPROL-XL) 100 MG 24 hr tablet TAKE 1 TABLET DAILY (Patient taking differently: Take 100 mg by mouth daily. ) 15 tablet 0  . pravastatin (PRAVACHOL) 40 MG tablet TAKE 1 TABLET DAILY (Patient taking differently: Take 40 mg by mouth daily. TAKE 1 TABLET DAILY) 15 tablet 0  .  sildenafil (VIAGRA) 100 MG tablet Take 100 mg by mouth daily as needed for erectile dysfunction (erectile dysfunction).    Marland Kitchen zolpidem (AMBIEN) 5 MG tablet take 1 tablet by mouth at bedtime (Patient taking differently: take 1 tablet by mouth at bedtime prn) 30 tablet 0   No current facility-administered medications for this visit.    Allergies: Allergies  Allergen Reactions  . Relpax [Eletriptan]     Chest pain, hypertension, sweating    Social History: The patient  reports that he quit smoking about 48 years ago. He does not have any smokeless tobacco history on file. He reports that he drinks alcohol. He reports that he does not use illicit drugs.   Family History: The patient's family history includes Stroke in his father; Sudden death in his brother; Throat  cancer in his brother; Thyroid disease in his sister.   Review of Systems: Please see the history of present illness.   Otherwise, the review of systems is positive for .   All other systems are reviewed and negative.   Physical Exam: VS:  BP 128/76 mmHg  Pulse 76  Ht 6\' 2"  (1.88 m)  Wt 257 lb 1.9 oz (116.629 kg)  BMI 33.00 kg/m2 .  BMI Body mass index is 33 kg/(m^2).  Wt Readings from Last 3 Encounters:  03/02/15 257 lb 1.9 oz (116.629 kg)  02/22/15 257 lb 1.6 oz (116.62 kg)  01/21/15 255 lb 8 oz (115.894 kg)    General: Pleasant. Well developed, well nourished and in no acute distress.  HEENT: Normal. Neck: Supple, no JVD, carotid bruits, or masses noted.  Cardiac: Regular rate and rhythm. Heart tones are distant. Rare ectopic noted. No murmurs, rubs, or gallops. No edema.  Respiratory:  Lungs are clear to auscultation bilaterally with normal work of breathing.  GI: Soft and nontender.  MS: No deformity or atrophy. Gait and ROM intact. Skin: Warm and dry. Color is normal. He has multiple patches of vitiligo noted.  Neuro:  Strength and sensation are intact and no gross focal deficits noted.  Psych: Alert, appropriate and with normal affect.   LABORATORY DATA:  EKG:  EKG is ordered today. This demonstrates NSR with 1st degree AV block. Early repolarization - EKG is unchanged.  Lab Results  Component Value Date   WBC 5.3 02/22/2015   HGB 14.5 02/22/2015   HCT 43.3 02/22/2015   PLT 131* 02/22/2015   GLUCOSE 99 12/17/2014   CHOL 150 04/27/2014   TRIG 40.0 04/27/2014   HDL 46.20 04/27/2014   LDLDIRECT 139.2 11/21/2012   LDLCALC 96 04/27/2014   ALT 14 12/17/2014   AST 20 12/17/2014   NA 140 12/17/2014   K 3.9 12/17/2014   CL 106 12/17/2014   CREATININE 0.96 12/17/2014   BUN 17 12/17/2014   CO2 28 12/17/2014   TSH 0.72 04/27/2014   PSA 1.77 04/27/2014   INR 1.01 02/08/2015   HGBA1C 6.1* 02/03/2013    BNP (last 3 results) No results for input(s): BNP in the last  8760 hours.  ProBNP (last 3 results) No results for input(s): PROBNP in the last 8760 hours.   Other Studies Reviewed Today:  Echo Study Conclusions from 01/2013  - Left ventricle: The cavity size was normal. Wall thickness was increased in a pattern of mild LVH. There was mild focal basal hypertrophy of the septum. Systolic function was normal. The estimated ejection fraction was in the range of 50% to 55%. Wall motion was normal; there were no regional  wall motion abnormalities. Doppler parameters are consistent with abnormal left ventricular relaxation (grade 1 diastolic dysfunction). Doppler parameters are consistent with high ventricular filling pressure. - Left atrium: The atrium was mildly dilated.   Assessment/Plan: 1. HTN - BP is ok on current regimen.   2. Palpitations - symptomatic - will place 30 day event monitor. This is not happening every day. I will see him back in 6 weeks unless sooner based on the monitor. Check BMET and TSH today.   3. Prior stroke  4. OSA  5. HLD  8. MUGUS - seeing oncology.  Current medicines are reviewed with the patient today.  The patient does not have concerns regarding medicines other than what has been noted above.  The following changes have been made:  See above.  Labs/ tests ordered today include:    Orders Placed This Encounter  Procedures  . Basic metabolic panel  . TSH  . Cardiac event monitor  . EKG 12-Lead  . 2D Echocardiogram without contrast     Disposition:   FU with me in 6 weeks.   Patient is agreeable to this plan and will call if any problems develop in the interim.   Signed: Burtis Junes, RN, ANP-C 03/02/2015 1:06 PM  [facility] [address] [place], [place]  [address] Phone: [phone] Fax: [phone]

## 2015-03-02 ENCOUNTER — Encounter (INDEPENDENT_AMBULATORY_CARE_PROVIDER_SITE_OTHER): Payer: Medicare Other

## 2015-03-02 ENCOUNTER — Encounter: Payer: Self-pay | Admitting: Radiology

## 2015-03-02 ENCOUNTER — Encounter: Payer: Self-pay | Admitting: Nurse Practitioner

## 2015-03-02 ENCOUNTER — Ambulatory Visit (INDEPENDENT_AMBULATORY_CARE_PROVIDER_SITE_OTHER): Payer: Medicare Other | Admitting: Nurse Practitioner

## 2015-03-02 VITALS — BP 128/76 | HR 76 | Ht 74.0 in | Wt 257.1 lb

## 2015-03-02 DIAGNOSIS — I10 Essential (primary) hypertension: Secondary | ICD-10-CM

## 2015-03-02 DIAGNOSIS — R002 Palpitations: Secondary | ICD-10-CM

## 2015-03-02 LAB — BASIC METABOLIC PANEL
BUN: 15 mg/dL (ref 6–23)
CO2: 31 mEq/L (ref 19–32)
Calcium: 9.5 mg/dL (ref 8.4–10.5)
Chloride: 103 mEq/L (ref 96–112)
Creatinine, Ser: 1.02 mg/dL (ref 0.40–1.50)
GFR: 93.46 mL/min (ref >60.00)
Glucose, Bld: 100 mg/dL — ABNORMAL HIGH (ref 70–99)
Potassium: 3.7 mEq/L (ref 3.5–5.1)
Sodium: 138 mEq/L (ref 135–145)

## 2015-03-02 LAB — TSH: TSH: 0.69 u[IU]/mL (ref 0.35–4.50)

## 2015-03-02 NOTE — Patient Instructions (Addendum)
We will be checking the following labs today - BMET & TSH   Medication Instructions:    Continue with your current medicines.     Testing/Procedures To Be Arranged:  Event monitor   Echocardiogram  Follow-Up:   We will see you back in 6 weeks.    Other Special Instructions:   Limit caffeine and alcohol use.   Call the [place] office at [phone] if you have any questions, problems or concerns.

## 2015-03-02 NOTE — Progress Notes (Signed)
Patient ID: Gordon Phillips, male   DOB: [birth year], 69 y.o.   MRN: [id number] [employer] 30 day monitor applied. EOS 03-22-15

## 2015-03-05 ENCOUNTER — Ambulatory Visit (HOSPITAL_COMMUNITY): Payer: Medicare Other | Attending: Cardiovascular Disease | Admitting: Cardiology

## 2015-03-05 DIAGNOSIS — I10 Essential (primary) hypertension: Secondary | ICD-10-CM | POA: Diagnosis not present

## 2015-03-05 DIAGNOSIS — R002 Palpitations: Secondary | ICD-10-CM | POA: Diagnosis not present

## 2015-03-05 NOTE — Progress Notes (Signed)
Echo performed. 

## 2015-03-06 DIAGNOSIS — G4733 Obstructive sleep apnea (adult) (pediatric): Secondary | ICD-10-CM | POA: Diagnosis not present

## 2015-03-10 ENCOUNTER — Telehealth: Payer: Self-pay | Admitting: *Deleted

## 2015-03-10 NOTE — Telephone Encounter (Signed)
Follow UP  Pt returning [place] phone call. Please call back and discuss.

## 2015-03-10 NOTE — Telephone Encounter (Signed)
Left message on machine for pt to contact the office.  Please call him about his echo - normal pumping function.         Does have a moderate amount of thickness of his heart muscle - need GREAT BP control.        Cecille Rubin    ----- Message -----     From: Baxter Flattery     Sent: 03/10/2015  8:56 AM      To: Burtis Junes, NP, Marletta Lor, MD        ECHO REPORT SCAN IN UNDER ORDER-LEVEL DOCUMENT

## 2015-03-10 NOTE — Telephone Encounter (Signed)
Pt aware of echo results 

## 2015-03-11 ENCOUNTER — Other Ambulatory Visit: Payer: Self-pay | Admitting: Internal Medicine

## 2015-03-17 DIAGNOSIS — G4733 Obstructive sleep apnea (adult) (pediatric): Secondary | ICD-10-CM | POA: Diagnosis not present

## 2015-04-06 DIAGNOSIS — G4733 Obstructive sleep apnea (adult) (pediatric): Secondary | ICD-10-CM | POA: Diagnosis not present

## 2015-04-13 ENCOUNTER — Ambulatory Visit: Payer: Medicare Other | Admitting: Nurse Practitioner

## 2015-04-19 ENCOUNTER — Ambulatory Visit (INDEPENDENT_AMBULATORY_CARE_PROVIDER_SITE_OTHER): Payer: Medicare Other | Admitting: Internal Medicine

## 2015-04-19 ENCOUNTER — Encounter: Payer: Self-pay | Admitting: Internal Medicine

## 2015-04-19 VITALS — BP 126/80 | HR 72 | Temp 98.3°F | Resp 20 | Ht 74.0 in | Wt 258.0 lb

## 2015-04-19 DIAGNOSIS — N400 Enlarged prostate without lower urinary tract symptoms: Secondary | ICD-10-CM | POA: Diagnosis not present

## 2015-04-19 DIAGNOSIS — I10 Essential (primary) hypertension: Secondary | ICD-10-CM

## 2015-04-19 MED ORDER — TAMSULOSIN HCL 0.4 MG PO CAPS: 0.4000 mg | ORAL_CAPSULE | Freq: Every day | ORAL | Status: DC

## 2015-04-19 NOTE — Progress Notes (Signed)
Pre visit review using our clinic review tool, if applicable. No additional management support is needed unless otherwise documented below in the visit note. 

## 2015-04-19 NOTE — Patient Instructions (Signed)
Benign Prostatic Hyperplasia An enlarged prostate (benign prostatic hyperplasia) is common in older men. You may experience the following:  Weak urine stream.  Dribbling.  Feeling like the bladder has not emptied completely.  Difficulty starting urination.  Getting up frequently at night to urinate.  Urinating more frequently during the day. HOME CARE INSTRUCTIONS  Monitor your prostatic hyperplasia for any changes. The following actions may help to alleviate any discomfort you are experiencing:  Give yourself time when you urinate.  Stay away from alcohol.  Avoid beverages containing caffeine, such as coffee, tea, and colas, because they can make the problem worse.  Avoid decongestants, antihistamines, and some prescription medicines that can make the problem worse.  Follow up with your health care provider for further treatment as recommended. SEEK MEDICAL CARE IF:  You are experiencing progressive difficulty voiding.  Your urine stream is progressively getting narrower.  You are awaking from sleep with the urge to void more frequently.  You are constantly feeling the need to void.  You experience loss of urine, especially in small amounts. SEEK IMMEDIATE MEDICAL CARE IF:   You develop increased pain with urination or are unable to urinate.  You develop severe abdominal pain, vomiting, a high fever, or fainting.  You develop back pain or blood in your urine. MAKE SURE YOU:   Understand these instructions.  Will watch your condition.  Will get help right away if you are not doing well or get worse. Document Released: 10/23/2005 Document Revised: 06/25/2013 Document Reviewed: 03/25/2013 Exit[employer] Patient Information 2015 [employer]. This information is not intended to replace advice given to you by your health care provider. Make sure you discuss any questions you have with your health care provider.  

## 2015-04-19 NOTE — Progress Notes (Signed)
Subjective:    Patient ID: Gordon Phillips, male    DOB: 25-Apr-1946, 69 y.o.   MRN: [id number]  HPI 69 year old patient who presents today with complaints of difficulty with urination.  Symptoms are nocturnal.  He states that through the night when he attempts to void urine flow is very prolonged and difficult.  He has nocturia 2-4 times per night.  His caffeine load is modest but he does have a very liberal fluid intake.  No decongestants.  He states he has very little difficulty throughout the day, although slight decreased urinary stream  Past Medical History  Diagnosis Date  . HYPERTHYROIDISM 01/25/2010  . HYPERTENSION 01/25/2010  . ALLERGIC RHINITIS 01/25/2010  . Vitiligo   . Obesity   . Pericarditis 1980's  . 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  . Impaired glucose tolerance   . OSA (obstructive sleep apnea)     Uses CPAP nightly  . Migraines   . Hx of echocardiogram     a.  Echo 02/03/13:  Mild LVH, mild focal basal septal hypertrophy, EF 50-55%, Gr 1 DD, mild LAE  . Hx of cardiovascular stress test     a. Lexiscan Myoview 02/06/13:  EF 59%, no ischemia or scar    History   Social History  . Marital Status: Married    Spouse Name: N/A  . Number of Children: 2  . Years of Education: N/A   Occupational History  . sales    Social History Main Topics  . Smoking status: Former Smoker -- 8 years    Quit date: 11/06/1966  . Smokeless tobacco: Not on file  . Alcohol Use: Yes     Comment: occasional - max 2x/week  . Drug Use: No  . Sexual Activity: Not on file   Other Topics Concern  . Not on file   Social History Narrative    Past Surgical History  Procedure Laterality Date  . Hernia repair      umbilical  . Lumbar laminectomy    . Inguinal hernia repair      Family History  Problem Relation Age of Onset  . Thyroid disease Sister     goiter  . Stroke Father     Passed away in his 22s  . Sudden death Brother     ? Drug use, no autopsy  .  Throat cancer Brother     Allergies  Allergen Reactions  . Relpax [Eletriptan]     Chest pain, hypertension, sweating    Current Outpatient Prescriptions on File Prior to Visit  Medication Sig Dispense Refill  . aspirin EC 81 MG tablet Take 1 tablet (81 mg total) by mouth daily.    . fluticasone (FLONASE) 50 MCG/ACT nasal spray Place 2 sprays into both nostrils as needed. (Patient taking differently: Place 2 sprays into both nostrils daily as needed for allergies. ) 16 g 2  . losartan (COZAAR) 100 MG tablet Take 1 tablet (100 mg total) by mouth daily. 90 tablet 3  . metoprolol succinate (TOPROL-XL) 100 MG 24 hr tablet TAKE 1 TABLET DAILY (Patient taking differently: Take 100 mg by mouth daily. ) 15 tablet 0  . pravastatin (PRAVACHOL) 40 MG tablet TAKE 1 TABLET DAILY (Patient taking differently: Take 40 mg by mouth daily. TAKE 1 TABLET DAILY) 15 tablet 0  . sildenafil (VIAGRA) 100 MG tablet Take 100 mg by mouth daily as needed for erectile dysfunction (erectile dysfunction).     No  current facility-administered medications on file prior to visit.    BP 126/80 mmHg  Pulse 72  Temp(Src) 98.3 F (36.8 C) (Oral)  Resp 20  Ht 6\' 2"  (1.88 m)  Wt 258 lb (117.028 kg)  BMI 33.11 kg/m2  SpO2 97%       Review of Systems  Genitourinary: Positive for frequency, decreased urine volume and difficulty urinating.       Objective:   Physical Exam  Constitutional: He appears well-developed and well-nourished. No distress.  Blood pressure 120/80          Assessment & Plan:   Symptomatic BPH.  Will give a trial of Flomax. Patient instructions dispensed Return in the fall for his physical

## 2015-04-26 ENCOUNTER — Telehealth: Payer: Self-pay | Admitting: *Deleted

## 2015-04-26 DIAGNOSIS — H25013 Cortical age-related cataract, bilateral: Secondary | ICD-10-CM | POA: Diagnosis not present

## 2015-04-26 DIAGNOSIS — H35033 Hypertensive retinopathy, bilateral: Secondary | ICD-10-CM | POA: Diagnosis not present

## 2015-04-26 DIAGNOSIS — H40013 Open angle with borderline findings, low risk, bilateral: Secondary | ICD-10-CM | POA: Diagnosis not present

## 2015-04-26 DIAGNOSIS — H2513 Age-related nuclear cataract, bilateral: Secondary | ICD-10-CM | POA: Diagnosis not present

## 2015-04-26 NOTE — Telephone Encounter (Signed)
Dr Aundra Dubin reviewed monitor done 03/02/15-03/31/15:  NSR with PACs, no atrial fibrillation.  Pt's wife Gordon Phillips notified, verbalized understanding,thanked me for call.

## 2015-05-06 DIAGNOSIS — G4733 Obstructive sleep apnea (adult) (pediatric): Secondary | ICD-10-CM | POA: Diagnosis not present

## 2015-05-17 ENCOUNTER — Other Ambulatory Visit: Payer: Medicare Other

## 2015-05-17 ENCOUNTER — Other Ambulatory Visit: Payer: Self-pay | Admitting: *Deleted

## 2015-05-17 ENCOUNTER — Ambulatory Visit: Payer: Medicare Other | Admitting: Adult Health

## 2015-05-18 ENCOUNTER — Telehealth: Payer: Self-pay | Admitting: Hematology

## 2015-05-18 NOTE — Telephone Encounter (Signed)
lvm for pt to call back to r/s missed lab

## 2015-05-20 ENCOUNTER — Telehealth: Payer: Self-pay | Admitting: Hematology

## 2015-05-20 NOTE — Telephone Encounter (Signed)
pt cld wanting to make labs same day as appt-gave pt appt time &date-pt undeerstood

## 2015-05-24 ENCOUNTER — Other Ambulatory Visit (HOSPITAL_BASED_OUTPATIENT_CLINIC_OR_DEPARTMENT_OTHER): Payer: Medicare Other

## 2015-05-24 ENCOUNTER — Telehealth: Payer: Self-pay | Admitting: Hematology

## 2015-05-24 ENCOUNTER — Encounter: Payer: Self-pay | Admitting: Hematology

## 2015-05-24 ENCOUNTER — Ambulatory Visit (HOSPITAL_BASED_OUTPATIENT_CLINIC_OR_DEPARTMENT_OTHER): Payer: Medicare Other | Admitting: Hematology

## 2015-05-24 VITALS — BP 154/76 | HR 62 | Temp 98.2°F | Resp 18 | Ht 74.0 in | Wt 257.4 lb

## 2015-05-24 DIAGNOSIS — Z808 Family history of malignant neoplasm of other organs or systems: Secondary | ICD-10-CM

## 2015-05-24 DIAGNOSIS — M545 Low back pain: Secondary | ICD-10-CM

## 2015-05-24 DIAGNOSIS — D696 Thrombocytopenia, unspecified: Secondary | ICD-10-CM

## 2015-05-24 DIAGNOSIS — D472 Monoclonal gammopathy: Secondary | ICD-10-CM

## 2015-05-24 DIAGNOSIS — L80 Vitiligo: Secondary | ICD-10-CM

## 2015-05-24 DIAGNOSIS — G8929 Other chronic pain: Secondary | ICD-10-CM | POA: Diagnosis not present

## 2015-05-24 DIAGNOSIS — E0500 Thyrotoxicosis with diffuse goiter without thyrotoxic crisis or storm: Secondary | ICD-10-CM

## 2015-05-24 LAB — COMPREHENSIVE METABOLIC PANEL (CC13)
ALT: 15 U/L (ref 0–55)
AST: 18 U/L (ref 5–34)
Albumin: 3.7 g/dL (ref 3.5–5.0)
Alkaline Phosphatase: 54 U/L (ref 40–150)
Anion Gap: 5 mEq/L (ref 3–11)
BUN: 15 mg/dL (ref 7.0–26.0)
CO2: 26 mEq/L (ref 22–29)
Calcium: 9.3 mg/dL (ref 8.4–10.4)
Chloride: 108 mEq/L (ref 98–109)
Creatinine: 1.2 mg/dL (ref 0.7–1.3)
EGFR: 72 mL/min/{1.73_m2} — ABNORMAL LOW (ref >90)
Glucose: 103 mg/dl (ref 70–140)
Potassium: 4.1 mEq/L (ref 3.5–5.1)
Sodium: 140 mEq/L (ref 136–145)
Total Bilirubin: 0.73 mg/dL (ref 0.20–1.20)
Total Protein: 7.2 g/dL (ref 6.4–8.3)

## 2015-05-24 LAB — CBC WITH DIFFERENTIAL/PLATELET
BASO%: 0.4 % (ref 0.0–2.0)
Basophils Absolute: 0 10*3/uL (ref 0.0–0.1)
EOS%: 2.5 % (ref 0.0–7.0)
Eosinophils Absolute: 0.1 10*3/uL (ref 0.0–0.5)
HCT: 40.6 % (ref 38.4–49.9)
HGB: 13.4 g/dL (ref 13.0–17.1)
LYMPH%: 30.4 % (ref 14.0–49.0)
MCH: 30 pg (ref 27.2–33.4)
MCHC: 33 g/dL (ref 32.0–36.0)
MCV: 90.8 fL (ref 79.3–98.0)
MONO#: 0.4 10*3/uL (ref 0.1–0.9)
MONO%: 7.3 % (ref 0.0–14.0)
NEUT#: 3.5 10*3/uL (ref 1.5–6.5)
NEUT%: 59.4 % (ref 39.0–75.0)
Platelets: 141 10*3/uL (ref 140–400)
RBC: 4.48 10*6/uL (ref 4.20–5.82)
RDW: 14.3 % (ref 11.0–14.6)
WBC: 5.8 10*3/uL (ref 4.0–10.3)
lymph#: 1.8 10*3/uL (ref 0.9–3.3)

## 2015-05-24 NOTE — Telephone Encounter (Signed)
Gave and printed appt sched anda vs fo rpt Jan 2017

## 2015-05-24 NOTE — Progress Notes (Signed)
[facility]  [phone] Fax:([phone]) [place] Note   Patient Care Team: Marletta Lor, MD as PCP - General 05/24/2015  CHIEF COMPLAINTS:  MGUS  Referring physician:[email or web address],PETER Pilar Plate, MD   HISTORY OF PRESENTING ILLNESS:  Gordon Phillips 69 y.o. male is here because of abnormal SPEP and suspicious bone lesion on the CT scan.  He had a fall after he tripped and his face hit a wall on the super bowl night on 12/14/2014. He had a significant bleeding on the face and neck pain and was brought to emergency room for further evaluation by 911. CT scan of head and the neck was obtained which showed soft tissue laceration overlying the right orbit, no fracture. The skin incidentally found scattered small lucencies within the visualized osseous structures, with a 6 mm lucency noticed at the C6. He subsequently follow-up with his primary care physician, who ordered the SPEP which showed a restricted band consistent with monoclonal protein, 0.27 g/dl, the quantitative immunoglobulin IgG, IgA and IgM were within normal limits. He developed neck pain which radiate to bilateral shoulders after the accident, this was further evaluated by cervical and lumbar MRI without contrast. The MRI showed diffuse degenerative changes. The low-density lesion in the C5 vertebral body on the CT wasn't not seen in the MRI. His neck pain radiated to b/l shoulders has resolved now.   He had L4-5 disck back surgery 15 years ago in [place], and he has some chronic back pain since then. It has been getting worse in the past few years. He takes Percocet as needed for the back pain. He otherwise feels well, denies any other pain. He works full-time, and remains to be physically active. He denies any fever, chills, or night sweats. No recent weight loss.   Interim history: He returns for follow up. He has chronic back pain, is stable and occasionally takes OTC meds. No other new pain.  He otherwise doing well. No other medial events. He is trying loosing some weight.   MEDICAL HISTORY:  Past Medical History  Diagnosis Date  . HYPERTHYROIDISM 01/25/2010  . HYPERTENSION 01/25/2010  . ALLERGIC RHINITIS 01/25/2010  . Vitiligo   . Obesity   . Pericarditis 1980's  . 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  . Impaired glucose tolerance   . OSA (obstructive sleep apnea)     Uses CPAP nightly  . Migraines   . Hx of echocardiogram     a.  Echo 02/03/13:  Mild LVH, mild focal basal septal hypertrophy, EF 50-55%, Gr 1 DD, mild LAE  . Hx of cardiovascular stress test     a. Lexiscan Myoview 02/06/13:  EF 59%, no ischemia or scar    SURGICAL HISTORY: Past Surgical History  Procedure Laterality Date  . Hernia repair      umbilical  . Lumbar laminectomy    . Inguinal hernia repair      SOCIAL HISTORY: History   Social History  . Marital Status: Married    Spouse Name: N/A  . Number of Children: 2  . Years of Education: N/A   Occupational History  . sales    Social History Main Topics  . Smoking status: Former Smoker -- 8 years    Quit date: 11/06/1966  . Smokeless tobacco: Not on file  . Alcohol Use: Yes     Comment: occasional - max 2x/week  . Drug Use: No  . Sexual Activity: Not  on file   Other Topics Concern  . Not on file   Social History Narrative    FAMILY HISTORY: Family History  Problem Relation Age of Onset  . Thyroid disease Sister     goiter  . Stroke Father     Passed away in his 20s  . Sudden death Brother     ? Drug use, no autopsy  . Throat cancer Brother     ALLERGIES:  is allergic to relpax.  MEDICATIONS:  Current Outpatient Prescriptions  Medication Sig Dispense Refill  . aspirin EC 81 MG tablet Take 1 tablet (81 mg total) by mouth daily.    . fluticasone (FLONASE) 50 MCG/ACT nasal spray Place 2 sprays into both nostrils as needed. (Patient taking differently: Place 2 sprays into both nostrils daily as needed for  allergies. ) 16 g 2  . losartan (COZAAR) 100 MG tablet Take 1 tablet (100 mg total) by mouth daily. 90 tablet 3  . metoprolol succinate (TOPROL-XL) 100 MG 24 hr tablet TAKE 1 TABLET DAILY (Patient taking differently: Take 100 mg by mouth daily. ) 15 tablet 0  . pravastatin (PRAVACHOL) 40 MG tablet TAKE 1 TABLET DAILY (Patient taking differently: Take 40 mg by mouth daily. TAKE 1 TABLET DAILY) 15 tablet 0  . sildenafil (VIAGRA) 100 MG tablet Take 100 mg by mouth daily as needed for erectile dysfunction (erectile dysfunction).    . tamsulosin (FLOMAX) 0.4 MG CAPS capsule Take 1 capsule (0.4 mg total) by mouth daily. 30 capsule 3   No current facility-administered medications for this visit.    REVIEW OF SYSTEMS:   Constitutional: Denies fevers, chills or abnormal night sweats Eyes: Denies blurriness of vision, double vision or watery eyes Ears, nose, mouth, throat, and face: Denies mucositis or sore throat Respiratory: Denies cough, dyspnea or wheezes Cardiovascular: Denies palpitation, chest discomfort or lower extremity swelling Gastrointestinal:  Denies nausea, heartburn or change in bowel habits Skin: Denies abnormal skin rashes Lymphatics: Denies new lymphadenopathy or easy bruising Neurological:Denies numbness, tingling or new weaknesses Behavioral/Psych: Mood is stable, no new changes  All other systems were reviewed with the patient and are negative.  PHYSICAL EXAMINATION: ECOG PERFORMANCE STATUS: 1 - Symptomatic but completely ambulatory  Filed Vitals:   05/24/15 1321  BP: 154/76  Pulse: 62  Temp: 98.2 F (36.8 C)  Resp: 18   Filed Weights   05/24/15 1321  Weight: 257 lb 6.4 oz (116.756 kg)    GENERAL:alert, no distress and comfortable SKIN: skin color, texture, turgor are normal, no rashes or significant lesions (+) vitiligo EYES: normal, conjunctiva are pink and non-injected, sclera clear OROPHARYNX:no exudate, no erythema and lips, buccal mucosa, and tongue normal    NECK: supple, thyroid normal size, non-tender, without nodularity LYMPH:  no palpable lymphadenopathy in the cervical, axillary or inguinal LUNGS: clear to auscultation and percussion with normal breathing effort HEART: regular rate & rhythm and no murmurs and no lower extremity edema ABDOMEN:abdomen soft, non-tender and normal bowel sounds Musculoskeletal:no cyanosis of digits and no clubbing  PSYCH: alert & oriented x 3 with fluent speech NEURO: no focal motor/sensory deficits  LABORATORY DATA:  I have reviewed the data as listed CBC Latest Ref Rng 05/24/2015 02/22/2015 02/08/2015  WBC 4.0 - 10.3 10e3/uL 5.8 5.3 5.1  Hemoglobin 13.0 - 17.1 g/dL 13.4 14.5 13.5  Hematocrit 38.4 - 49.9 % 40.6 43.3 41.1  Platelets 140 - 400 10e3/uL 141 131(L) 142(L)    CMP Latest Ref Rng 05/24/2015 03/02/2015 12/17/2014  Glucose 70 - 140 mg/dl 103 100(H) 99  BUN 7.0 - 26.0 mg/dL 15.0 15 17  Creatinine 0.7 - 1.3 mg/dL 1.2 1.02 0.96  Sodium 136 - 145 mEq/L 140 138 140  Potassium 3.5 - 5.1 mEq/L 4.1 3.7 3.9  Chloride 96 - 112 mEq/L - 103 106  CO2 22 - 29 mEq/L _0 Calcium 8.4 - 10.4 mg/dL 9.3 9.5 9.5  Total Protein 6.4 - 8.3 g/dL 7.2 - 7.7  Total Bilirubin 0.20 - 1.20 mg/dL 0.73 - 0.8  Alkaline Phos 40 - 150 U/L 54 - 50  AST 5 - 34 U/L 18 - 20  ALT 0 - 55 U/L 15 - 14   PATHOLOGY REPORT  Bone Marrow, Aspirate,Biopsy, and Clot, iliac crest 02/08/2015 - HYPERCELLULAR BONE MARROW FOR AGE WITH TRILINEAGE HEMATOPOIESIS INCLUDING ABUNDANT MEGAKARYOCYTES. - 5% PLASMA CELLS. - SEE COMMENT. PERIPHERAL BLOOD: - NO SIGNIFICANT MORPHOLOGIC ABNORMALITIES. Diagnosis Note The bone marrow is hypercellular for age with trilineage hematopoiesis including abundant megakaryocytes, some of which display atypical features. The significance of latter finding is uncertain at this time and hence correlation with cytogenetic studies is recommended. The plasma cells represent 5% of all cells in the aspirate associated with  kappa light chain excess. Given the relatively small number of plasma cells evaluated, the overall features are considered suggestive but not entirely definitive of plasma cell dyscrasia. Clinical and cytogenetic correlation is recommended. (BNS:ecj 02/10/2015)    RADIOGRAPHIC STUDIES: I have personally reviewed the radiological images as listed and agreed with the findings in the report. Mr Cervical Spine Wo Contrast 01/14/2015    IMPRESSION: Negative for cervical spine fracture  Moderate cervical disc degeneration and spondylosis.  C5 vertebral lesion on the left on CT is difficult to visualize on MRI however no worrisome lesion is seen in this area.   Electronically Signed   By: Franchot Gallo M.D.   On: 01/14/2015 08:04   Mr Lumbar Spine Wo Contrast 01/14/2015    IMPRESSION: Extra foraminal disc contacts the exited left L2 root.  Moderate to moderately severe bilateral foraminal narrowing, worse on the right at L3-4 is due to disc with endplate spurring and facet degenerative disease.  Advanced facet arthropathy at L5-S1 results in 0.4 cm anterolisthesis. There is left much worse than right lateral recess narrowing at this level and moderate to moderately severe bilateral foraminal narrowing.  Moderate to moderately severe bilateral foraminal narrowing at L4-5 appears worse on the right. The central canal and right lateral recess are also mildly narrowed at this level.   Electronically Signed   By: Inge Rise M.D.   On: 01/14/2015 10:02    Bone survey 02/08/2015 IMPRESSION: Subtle lucencies are noted in the skull, cervical spine, and left femoral greater trochanter. Very subtle changes of myeloma cannot be completely excluded.   ASSESSMENT & PLAN:  69 year old African-American male, with past medical history of Graves' disease, vitiligo, hypertension, dyslipidemia, was incidentally 2 found a small hypodense lesion in C5 on CT scan. Lab work reviewed a monoclonal paraprotein.  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  -His CT scan revealed a small hyper lytic lesion in the C5 vertebral body however was not obvious on the MRI scan. -A bone survey showed subtle lucencies in multiple bones, indeterminate.  -His bone marrow biopsy showed 5% plasma cells, suggestive of plasma cell dyscrasia. Cytogenetics was normal. -  We discussed the natural history of MGUS, and small possibility (1% every year) of developing multiple myeloma. I recommend routine follow-up every 3-6 months.   -His lab CBC and CMP are normal today, SPEP and light chain levels are still pending. I'll call him with the lab results.  2. Mild thrombocytopenia -The platelet count has been in the range of 130 to 153 in the past 2 years, stable overall. -He does have other autoimmune disease including Graves' disease and vitiligo. He is a woman with a - His bone marrow biopsy showed abundant megakaryocytes, some of which display atypical features, the significance is uncertain. -We'll continue follow-up his CBC. Platelet count 141,000 today  3. Hypertension, dyslipidemia, spinal degenerative changes, chronic back pain -He'll continue follow-up with his primary care physician  Plan -Return to clinic in 6 months with lab  -I will call with his SPEP results   All questions were answered. The patient knows to call the clinic with any problems, questions or concerns. I spent 15 minutes counseling the patient face to face. The total time spent in the appointment was 20 minutes and more than 50% was on counseling.     Truitt Merle, MD 05/24/2015 1:44 PM

## 2015-05-26 LAB — SPEP & IFE WITH QIG
Abnormal Protein Band1: 0.3 g/dL
Albumin ELP: 3.8 g/dL (ref 3.8–4.8)
Alpha-1-Globulin: 0.4 g/dL — ABNORMAL HIGH (ref 0.2–0.3)
Alpha-2-Globulin: 1 g/dL — ABNORMAL HIGH (ref 0.5–0.9)
Beta 2: 0.4 g/dL (ref 0.2–0.5)
Beta Globulin: 0.3 g/dL — ABNORMAL LOW (ref 0.4–0.6)
Gamma Globulin: 1.2 g/dL (ref 0.8–1.7)
IgA: 138 mg/dL (ref 68–379)
IgG (Immunoglobin G), Serum: 1360 mg/dL (ref 650–1600)
IgM, Serum: 53 mg/dL (ref 41–251)
Total Protein, Serum Electrophoresis: 7.1 g/dL (ref 6.1–8.1)

## 2015-05-31 ENCOUNTER — Ambulatory Visit (INDEPENDENT_AMBULATORY_CARE_PROVIDER_SITE_OTHER): Payer: Medicare Other | Admitting: Adult Health

## 2015-05-31 ENCOUNTER — Telehealth: Payer: Self-pay | Admitting: Hematology

## 2015-05-31 ENCOUNTER — Encounter: Payer: Self-pay | Admitting: Adult Health

## 2015-05-31 VITALS — BP 122/68 | HR 66 | Temp 97.7°F | Ht 74.0 in | Wt 260.0 lb

## 2015-05-31 DIAGNOSIS — G4733 Obstructive sleep apnea (adult) (pediatric): Secondary | ICD-10-CM

## 2015-05-31 NOTE — Progress Notes (Signed)
   Subjective:    Patient ID: Gordon Phillips, male    DOB: [birth year], 69 y.o.   MRN: [id number]  HPI 69 year old male with obstructive sleep apnea Mod- AHI 28/h-CPAP 11 cm  05/31/2015 Follow up : OSA Royal Piedra NP ov  Patient returns for six-month follow-up  Uses CPAP 5-7 hours nightly, mask fits well.  Pt states  breathing is doing great.  Download shows good compliance with average. Uses at 6 hours. He is on a set pressure and 11cm Minimum leaks, AHI 1.1 He says he is doing very well. Feels that he is much improved after starting C Pap. Is able to sleep throughout the night with no significant daytime sleepiness. Discussed weight loss.   Review of Systems Constitutional:   No  weight loss, night sweats,  Fevers, chills, fatigue, or  lassitude.  HEENT:   No headaches,  Difficulty swallowing,  Tooth/dental problems, or  Sore throat,                No sneezing, itching, ear ache, nasal congestion, post nasal drip,   CV:  No chest pain,  Orthopnea, PND, swelling in lower extremities, anasarca, dizziness, palpitations, syncope.   GI  No heartburn, indigestion, abdominal pain, nausea, vomiting, diarrhea, change in bowel habits, loss of appetite, bloody stools.   Resp: No shortness of breath with exertion or at rest.  No excess mucus, no productive cough,  No non-productive cough,  No coughing up of blood.  No change in color of mucus.  No wheezing.  No chest wall deformity  Skin: no rash or lesions.  GU: no dysuria, change in color of urine, no urgency or frequency.  No flank pain, no hematuria   MS:  No joint pain or swelling.  No decreased range of motion.  No back pain.  Psych:  No change in mood or affect. No depression or anxiety.  No memory loss.         Objective:   Physical Exam GEN: A/Ox3; pleasant , NAD, well nourished   HEENT:  [place]/AT,  EACs-clear, TMs-wnl, NOSE-clear, THROAT-clear, no lesions, no postnasal drip or exudate noted. Class 2 airway   NECK:  Supple w/  fair ROM; no JVD; normal carotid impulses w/o bruits; no thyromegaly or nodules palpated; no lymphadenopathy.  RESP  Clear  P & A; w/o, wheezes/ rales/ or rhonchi.no accessory muscle use, no dullness to percussion  CARD:  RRR, no m/r/g  , no peripheral edema, pulses intact, no cyanosis or clubbing.  GI:   Soft & nt; nml bowel sounds; no organomegaly or masses detected.  Musco: Warm bil, no deformities or joint swelling noted.   Neuro: alert, no focal deficits noted.    Skin: Warm, no lesions or rashes         Assessment & Plan:

## 2015-05-31 NOTE — Assessment & Plan Note (Signed)
Work on weight loss.

## 2015-05-31 NOTE — Assessment & Plan Note (Signed)
Well-controlled sleep apnea on nocturnal C Pap  Plan Keep up good work  Wear CPAP At bedtime  For at least 6 hrs  Work on weight loss . Do not drive if sleepy  follow up Dr. Elsworth Soho  In 1 year and As needed

## 2015-05-31 NOTE — Telephone Encounter (Signed)
I called the patient and discussed his SPEP result,  Which showed M protein 0.3g/dl, stable.  He voiced good understanding and appreciated the call.  Truitt Merle  05/31/2015

## 2015-05-31 NOTE — Addendum Note (Signed)
Addended by: Osa Craver on: 05/31/2015 03:36 PM   Modules accepted: Orders

## 2015-05-31 NOTE — Patient Instructions (Signed)
Keep up good work  Wear CPAP At bedtime  For at least 6 hrs  Work on weight loss . Do not drive if sleepy  follow up Dr. Elsworth Soho  In 1 year and As needed

## 2015-06-01 NOTE — Progress Notes (Signed)
Reviewed & agree with plan  

## 2015-06-03 ENCOUNTER — Telehealth: Payer: Self-pay | Admitting: Internal Medicine

## 2015-06-03 MED ORDER — DOXAZOSIN MESYLATE 2 MG PO TABS: 2.0000 mg | ORAL_TABLET | Freq: Every day | ORAL | Status: DC

## 2015-06-03 NOTE — Telephone Encounter (Signed)
Dr. Raliegh Ip, please see message and advise another medication.

## 2015-06-03 NOTE — Telephone Encounter (Signed)
Discontinue Flomax

## 2015-06-03 NOTE — Telephone Encounter (Signed)
Please see message and advise about itching from Flomax.

## 2015-06-03 NOTE — Telephone Encounter (Signed)
Spoke to pt's wife Diane, told her Dr.K is going to put him on Cardura 2 mg one tablet at bedtime. Rx sent to [facility] on [address]. Diane verbalized understanding and will let pt know.

## 2015-06-03 NOTE — Telephone Encounter (Signed)
Patient Name: GLENNIE RODDA  DOB: [birth year]    Initial Comment Caller states husband was put on Flomax, having bad itching.   Nurse Assessment  Nurse: Mallie Mussel, RN, Alveta Heimlich Date/Time Eilene Ghazi Time): 06/03/2015 11:38:57 AM  Confirm and document reason for call. If symptomatic, describe symptoms. ---Caller states that her husband was put on Flomax about 2-3 weeks ago. He has had itching that is bad which began almost right away. He is not with her husband at this time, he is at work. He can be reached at [phone]. Advised her I will try to reach him. 1150-I called the provided number and received his voicemail. I left a message that I will call back.  Has the patient traveled out of the country within the last 30 days? ---Not Applicable  Does the patient require triage? ---No     Guidelines    Guideline Title Affirmed Question Affirmed Notes       Final Disposition User   Clinical Call Mallie Mussel, RN, Alveta Heimlich    Comments  1206-I tried to reach the caller to notify her that I have tried twice to reach her husband, but I received her voicemail. I left a message that she might want to give him a call to let him know that I am trying to reach him and that I will try again to reach him.  I discussed this call with charge nurse Lala Lund who states that we should not be calling her husband, she will have to have her husband to call us.  [place] advised that her husband will have to give [place] a call back. She verbalized understanding.

## 2015-06-03 NOTE — Telephone Encounter (Signed)
Spoke to pt's wife Diane, told her Dr.K said for him to stop Flomax. Diane verbalized understanding and said he needs something else than to help him urinate due to having trouble. Told her I will have to send a message to see if there is something else he can prescribe and I will get back to you. Diane verbalized understanding.

## 2015-06-03 NOTE — Telephone Encounter (Signed)
Generic Cardura 2 mg #30 one tablet at bedtime

## 2015-06-14 DIAGNOSIS — H35033 Hypertensive retinopathy, bilateral: Secondary | ICD-10-CM | POA: Diagnosis not present

## 2015-06-14 DIAGNOSIS — H43393 Other vitreous opacities, bilateral: Secondary | ICD-10-CM | POA: Diagnosis not present

## 2015-06-14 DIAGNOSIS — H25013 Cortical age-related cataract, bilateral: Secondary | ICD-10-CM | POA: Diagnosis not present

## 2015-06-14 DIAGNOSIS — H40013 Open angle with borderline findings, low risk, bilateral: Secondary | ICD-10-CM | POA: Diagnosis not present

## 2015-06-14 LAB — HM DIABETES EYE EXAM

## 2015-06-28 ENCOUNTER — Encounter: Payer: Self-pay | Admitting: Pulmonary Disease

## 2015-07-02 ENCOUNTER — Encounter: Payer: Self-pay | Admitting: Internal Medicine

## 2015-08-30 ENCOUNTER — Ambulatory Visit (HOSPITAL_BASED_OUTPATIENT_CLINIC_OR_DEPARTMENT_OTHER): Payer: Medicare Other

## 2015-08-30 ENCOUNTER — Ambulatory Visit (HOSPITAL_BASED_OUTPATIENT_CLINIC_OR_DEPARTMENT_OTHER): Payer: Medicare Other | Admitting: Hematology

## 2015-08-30 ENCOUNTER — Encounter: Payer: Self-pay | Admitting: Hematology

## 2015-08-30 ENCOUNTER — Telehealth: Payer: Self-pay | Admitting: Hematology

## 2015-08-30 VITALS — BP 150/73 | HR 55 | Temp 97.9°F | Resp 20 | Ht 74.0 in | Wt 265.8 lb

## 2015-08-30 DIAGNOSIS — D696 Thrombocytopenia, unspecified: Secondary | ICD-10-CM

## 2015-08-30 DIAGNOSIS — E785 Hyperlipidemia, unspecified: Secondary | ICD-10-CM

## 2015-08-30 DIAGNOSIS — G8929 Other chronic pain: Secondary | ICD-10-CM

## 2015-08-30 DIAGNOSIS — D472 Monoclonal gammopathy: Secondary | ICD-10-CM

## 2015-08-30 DIAGNOSIS — R5383 Other fatigue: Secondary | ICD-10-CM

## 2015-08-30 DIAGNOSIS — M545 Low back pain: Secondary | ICD-10-CM | POA: Diagnosis not present

## 2015-08-30 DIAGNOSIS — I10 Essential (primary) hypertension: Secondary | ICD-10-CM

## 2015-08-30 LAB — CBC WITH DIFFERENTIAL/PLATELET
BASO%: 0.5 % (ref 0.0–2.0)
Basophils Absolute: 0 10*3/uL (ref 0.0–0.1)
EOS%: 2.7 % (ref 0.0–7.0)
Eosinophils Absolute: 0.2 10*3/uL (ref 0.0–0.5)
HCT: 41.3 % (ref 38.4–49.9)
HGB: 13.7 g/dL (ref 13.0–17.1)
LYMPH%: 35.8 % (ref 14.0–49.0)
MCH: 30.1 pg (ref 27.2–33.4)
MCHC: 33.1 g/dL (ref 32.0–36.0)
MCV: 90.9 fL (ref 79.3–98.0)
MONO#: 0.4 10*3/uL (ref 0.1–0.9)
MONO%: 7.6 % (ref 0.0–14.0)
NEUT#: 3.1 10*3/uL (ref 1.5–6.5)
NEUT%: 53.4 % (ref 39.0–75.0)
Platelets: 141 10*3/uL (ref 140–400)
RBC: 4.55 10*6/uL (ref 4.20–5.82)
RDW: 13.8 % (ref 11.0–14.6)
WBC: 5.8 10*3/uL (ref 4.0–10.3)
lymph#: 2.1 10*3/uL (ref 0.9–3.3)

## 2015-08-30 LAB — COMPREHENSIVE METABOLIC PANEL (CC13)
ALT: 17 U/L (ref 0–55)
AST: 21 U/L (ref 5–34)
Albumin: 4 g/dL (ref 3.5–5.0)
Alkaline Phosphatase: 55 U/L (ref 40–150)
Anion Gap: 7 mEq/L (ref 3–11)
BUN: 14.6 mg/dL (ref 7.0–26.0)
CO2: 28 mEq/L (ref 22–29)
Calcium: 9.6 mg/dL (ref 8.4–10.4)
Chloride: 107 mEq/L (ref 98–109)
Creatinine: 1.1 mg/dL (ref 0.7–1.3)
EGFR: 79 mL/min/{1.73_m2} — ABNORMAL LOW (ref >90)
Glucose: 77 mg/dl (ref 70–140)
Potassium: 4 mEq/L (ref 3.5–5.1)
Sodium: 142 mEq/L (ref 136–145)
Total Bilirubin: 1.04 mg/dL (ref 0.20–1.20)
Total Protein: 7.6 g/dL (ref 6.4–8.3)

## 2015-08-30 NOTE — Telephone Encounter (Signed)
Patient sent back to lab. No other orders per 10/24 pof.

## 2015-08-30 NOTE — Progress Notes (Signed)
[place]  [phone] [phone]  Clinic follow Up Note   Patient Care Team: Marletta Lor, MD as PCP - General 08/30/2015  CHIEF COMPLAINTS:  Follow up MGUS  HISTORY OF PRESENTING ILLNESS:  Gordon Phillips 69 y.o. male is here because of abnormal SPEP and suspicious bone lesion on the CT scan.  He had a fall after he tripped and his face hit a wall on the super bowl night on 12/14/2014. He had a significant bleeding on the face and neck pain and was brought to emergency room for further evaluation by 911. CT scan of head and the neck was obtained which showed soft tissue laceration overlying the right orbit, no fracture. The skin incidentally found scattered small lucencies within the visualized osseous structures, with a 6 mm lucency noticed at the C6. He subsequently follow-up with his primary care physician, who ordered the SPEP which showed a restricted band consistent with monoclonal protein, 0.27 g/dl, the quantitative immunoglobulin IgG, IgA and IgM were within normal limits. He developed neck pain which radiate to bilateral shoulders after the accident, this was further evaluated by cervical and lumbar MRI without contrast. The MRI showed diffuse degenerative changes. The low-density lesion in the C5 vertebral body on the CT wasn't not seen in the MRI. His neck pain radiated to b/l shoulders has resolved now.   He had L4-5 disck back surgery 15 years ago in [place], and he has some chronic back pain since then. It has been getting worse in the past few years. He takes Percocet as needed for the back pain. He otherwise feels well, denies any other pain. He works full-time, and remains to be physically active. He denies any fever, chills, or night sweats. No recent weight loss.   Interim history: Pt's wife called and requested to be seen earlier than his scheduled appointment. She is concerned about his back pain and fatigue. Patient has chronic back  pain, was seen by supportive medicine doctor before, had lumbar MRI in March 2016, but has not followed up with them since then. He states the pain is moderate, positional, with radiating pain to both legs. No leg weakness or urinary incontinence. He works in store, and is on his feet most of time, which he contributes to his pain. He takes over-the-counter pain medication some time. He also has mild to moderate fatigue, able tolerate his job and a routine activity, but does feel tired afterwards. No significant weight loss, no other new pain.  MEDICAL HISTORY:  Past Medical History  Diagnosis Date  . HYPERTHYROIDISM 01/25/2010  . HYPERTENSION 01/25/2010  . ALLERGIC RHINITIS 01/25/2010  . Vitiligo   . Obesity   . Pericarditis 1980's  . 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  . Impaired glucose tolerance   . OSA (obstructive sleep apnea)     Uses CPAP nightly  . Migraines   . Hx of echocardiogram     a.  Echo 02/03/13:  Mild LVH, mild focal basal septal hypertrophy, EF 50-55%, Gr 1 DD, mild LAE  . Hx of cardiovascular stress test     a. Lexiscan Myoview 02/06/13:  EF 59%, no ischemia or scar    SURGICAL HISTORY: Past Surgical History  Procedure Laterality Date  . Hernia repair      umbilical  . Lumbar laminectomy    . Inguinal hernia repair      SOCIAL HISTORY: Social History   Social History  .  Marital Status: Married    Spouse Name: N/A  . Number of Children: 2  . Years of Education: N/A   Occupational History  . sales    Social History Main Topics  . Smoking status: Former Smoker -- 8 years    Quit date: 11/06/1966  . Smokeless tobacco: Not on file  . Alcohol Use: Yes     Comment: occasional - max 2x/week  . Drug Use: No  . Sexual Activity: Not on file   Other Topics Concern  . Not on file   Social History Narrative    FAMILY HISTORY: Family History  Problem Relation Age of Onset  . Thyroid disease Sister     goiter  . Stroke Father      Passed away in his 97s  . Sudden death Brother     ? Drug use, no autopsy  . Throat cancer Brother     ALLERGIES:  is allergic to relpax.  MEDICATIONS:  Current Outpatient Prescriptions  Medication Sig Dispense Refill  . aspirin EC 81 MG tablet Take 1 tablet (81 mg total) by mouth daily.    . fluticasone (FLONASE) 50 MCG/ACT nasal spray Place 2 sprays into both nostrils as needed. (Patient taking differently: Place 2 sprays into both nostrils daily as needed for allergies. ) 16 g 2  . losartan (COZAAR) 100 MG tablet Take 1 tablet (100 mg total) by mouth daily. 90 tablet 3  . metoprolol succinate (TOPROL-XL) 100 MG 24 hr tablet TAKE 1 TABLET DAILY (Patient taking differently: Take 100 mg by mouth daily. ) 15 tablet 0  . pravastatin (PRAVACHOL) 40 MG tablet TAKE 1 TABLET DAILY (Patient taking differently: Take 40 mg by mouth daily. TAKE 1 TABLET DAILY) 15 tablet 0  . sildenafil (VIAGRA) 100 MG tablet Take 100 mg by mouth daily as needed for erectile dysfunction (erectile dysfunction).    . tamsulosin (FLOMAX) 0.4 MG CAPS capsule Take 1 capsule (0.4 mg total) by mouth daily. 30 capsule 3   No current facility-administered medications for this visit.    REVIEW OF SYSTEMS:   Constitutional: Denies fevers, chills or abnormal night sweats Eyes: Denies blurriness of vision, double vision or watery eyes Ears, nose, mouth, throat, and face: Denies mucositis or sore throat Respiratory: Denies cough, dyspnea or wheezes Cardiovascular: Denies palpitation, chest discomfort or lower extremity swelling Gastrointestinal:  Denies nausea, heartburn or change in bowel habits Skin: Denies abnormal skin rashes Lymphatics: Denies new lymphadenopathy or easy bruising Neurological:Denies numbness, tingling or new weaknesses Behavioral/Psych: Mood is stable, no new changes  All other systems were reviewed with the patient and are negative.  PHYSICAL EXAMINATION: ECOG PERFORMANCE STATUS: 1 - Symptomatic but  completely ambulatory  Filed Vitals:   08/30/15 1428  BP: 150/73  Pulse: 55  Temp: 97.9 F (36.6 C)  Resp: 20   Filed Weights   08/30/15 1428  Weight: 265 lb 12.8 oz (120.566 kg)    GENERAL:alert, no distress and comfortable SKIN: skin color, texture, turgor are normal, no rashes or significant lesions (+) vitiligo EYES: normal, conjunctiva are pink and non-injected, sclera clear OROPHARYNX:no exudate, no erythema and lips, buccal mucosa, and tongue normal  NECK: supple, thyroid normal size, non-tender, without nodularity LYMPH:  no palpable lymphadenopathy in the cervical, axillary or inguinal LUNGS: clear to auscultation and percussion with normal breathing effort HEART: regular rate & rhythm and no murmurs and no lower extremity edema ABDOMEN:abdomen soft, non-tender and normal bowel sounds Musculoskeletal:no cyanosis of digits and no clubbing  PSYCH: alert & oriented x 3 with fluent speech NEURO: no focal motor/sensory deficits  LABORATORY DATA:  I have reviewed the data as listed CBC Latest Ref Rng 08/30/2015 05/24/2015 02/22/2015  WBC 4.0 - 10.3 10e3/uL 5.8 5.8 5.3  Hemoglobin 13.0 - 17.1 g/dL 13.7 13.4 14.5  Hematocrit 38.4 - 49.9 % 41.3 40.6 43.3  Platelets 140 - 400 10e3/uL 141 141 131(L)    CMP Latest Ref Rng 08/30/2015 05/24/2015 03/02/2015  Glucose 70 - 140 mg/dl 77 103 100(H)  BUN 7.0 - 26.0 mg/dL 14.6 15.0 15  Creatinine 0.7 - 1.3 mg/dL 1.1 1.2 1.02  Sodium 136 - 145 mEq/L 142 140 138  Potassium 3.5 - 5.1 mEq/L 4.0 4.1 3.7  Chloride 96 - 112 mEq/L - - 103  CO2 22 - 29 mEq/L _0 Calcium 8.4 - 10.4 mg/dL 9.6 9.3 9.5  Total Protein 6.4 - 8.3 g/dL 7.6 7.2 -  Total Bilirubin 0.20 - 1.20 mg/dL 1.04 0.73 -  Alkaline Phos 40 - 150 U/L 55 54 -  AST 5 - 34 U/L 21 18 -  ALT 0 - 55 U/L 17 15 -   SPEP/IFE M-protein  05/24/2015: 0.3  PATHOLOGY REPORT  Bone Marrow, Aspirate,Biopsy, and Clot, iliac crest 02/08/2015 - HYPERCELLULAR BONE MARROW FOR AGE WITH  TRILINEAGE HEMATOPOIESIS INCLUDING ABUNDANT MEGAKARYOCYTES. - 5% PLASMA CELLS. - SEE COMMENT. PERIPHERAL BLOOD: - NO SIGNIFICANT MORPHOLOGIC ABNORMALITIES. Diagnosis Note The bone marrow is hypercellular for age with trilineage hematopoiesis including abundant megakaryocytes, some of which display atypical features. The significance of latter finding is uncertain at this time and hence correlation with cytogenetic studies is recommended. The plasma cells represent 5% of all cells in the aspirate associated with kappa light chain excess. Given the relatively small number of plasma cells evaluated, the overall features are considered suggestive but not entirely definitive of plasma cell dyscrasia. Clinical and cytogenetic correlation is recommended. (BNS:ecj 02/10/2015)    RADIOGRAPHIC STUDIES: I have personally reviewed the radiological images as listed and agreed with the findings in the report. Mr Cervical Spine Wo Contrast 01/14/2015    IMPRESSION: Negative for cervical spine fracture  Moderate cervical disc degeneration and spondylosis.  C5 vertebral lesion on the left on CT is difficult to visualize on MRI however no worrisome lesion is seen in this area.   Electronically Signed   By: Franchot Gallo M.D.   On: 01/14/2015 08:04   Mr Lumbar Spine Wo Contrast 01/14/2015    IMPRESSION: Extra foraminal disc contacts the exited left L2 root.  Moderate to moderately severe bilateral foraminal narrowing, worse on the right at L3-4 is due to disc with endplate spurring and facet degenerative disease.  Advanced facet arthropathy at L5-S1 results in 0.4 cm anterolisthesis. There is left much worse than right lateral recess narrowing at this level and moderate to moderately severe bilateral foraminal narrowing.  Moderate to moderately severe bilateral foraminal narrowing at L4-5 appears worse on the right. The central canal and right lateral recess are also mildly narrowed at this level.   Electronically  Signed   By: Inge Rise M.D.   On: 01/14/2015 10:02    Bone survey 02/08/2015 IMPRESSION: Subtle lucencies are noted in the skull, cervical spine, and left femoral greater trochanter. Very subtle changes of myeloma cannot be completely excluded.   ASSESSMENT & PLAN:  69 year old African-American male, with past medical history of Graves' disease, vitiligo, hypertension, dyslipidemia, was incidentally 2 found a small hypodense lesion in C5 on CT  scan. Lab work reviewed a monoclonal paraprotein.  1. MGUS -His SPEP reveal a low-level of monoclonal paraprotein (0.27g/dl), immunoglobulin level were normal, he does not have anemia, renal dysfunction, hypercalcemia on the lab test.  -His CT scan revealed a small hyper lytic lesion in the C5 vertebral body however was not obvious on the MRI scan. -A bone survey showed subtle lucencies in multiple bones, indeterminate.  -His bone marrow biopsy showed 5% plasma cells, suggestive of plasma cell dyscrasia. Cytogenetics was normal. -We discussed the natural history of MGUS, and small possibility (1% every year) of developing multiple myeloma. I recommend routine follow-up every 3-6 months.   -His repeated lab in July 2016 showed stable M protein, and normal CBC CMP. -I think his back pain and fatigue unlikely related to his MGUS, no high clinical suspicion for disease progression. I'll repeat his lab today. CBC and CMP today are within normal limits, SPEP with immunofixation is still pending.  2. Mild thrombocytopenia -The platelet count has been in the range of 130 to 153 in the past 2 years, stable overall. -He does have other autoimmune disease including Graves' disease and vitiligo.  - His bone marrow biopsy showed abundant megakaryocytes, some of which display atypical features, the significance is uncertain. -We'll continue follow-up his CBC. Platelet count 141,000 today  3. Chronic back pain -His lumbar MRI in March 2016 showed severe  degenerative changes, which is likely the cause of his back pain. -I strongly encouraged him to follow-up with his sports medicine Dr.  4. Fatigue -I encouraged him to follow-up with his primary care physician. His TSH was normal early disease.  3. Hypertension, dyslipidemia -He'll continue follow-up with his primary care physician  Plan -Return to clinic in 3 months with lab  -I will call with his SPEP results   All questions were answered. The patient knows to call the clinic with any problems, questions or concerns. I spent 15 minutes counseling the patient face to face. The total time spent in the appointment was 20 minutes and more than 50% was on counseling.     Truitt Merle, MD 08/30/2015 3:15 PM

## 2015-09-01 ENCOUNTER — Telehealth: Payer: Self-pay | Admitting: *Deleted

## 2015-09-01 LAB — SPEP & IFE WITH QIG
Abnormal Protein Band1: 0.3 g/dL
Albumin ELP: 4.2 g/dL (ref 3.8–4.8)
Alpha-1-Globulin: 0.3 g/dL (ref 0.2–0.3)
Alpha-2-Globulin: 0.9 g/dL (ref 0.5–0.9)
Beta 2: 0.4 g/dL (ref 0.2–0.5)
Beta Globulin: 0.4 g/dL (ref 0.4–0.6)
Gamma Globulin: 1.3 g/dL (ref 0.8–1.7)
IgA: 131 mg/dL (ref 68–379)
IgG (Immunoglobin G), Serum: 1420 mg/dL (ref 650–1600)
IgM, Serum: 63 mg/dL (ref 41–251)
Total Protein, Serum Electrophoresis: 7.5 g/dL (ref 6.1–8.1)

## 2015-09-01 LAB — KAPPA/LAMBDA LIGHT CHAINS
Kappa free light chain: 1.53 mg/dL (ref 0.33–1.94)
Kappa:Lambda Ratio: 1.39 (ref 0.26–1.65)
Lambda Free Lght Chn: 1.1 mg/dL (ref 0.57–2.63)

## 2015-09-01 NOTE — Telephone Encounter (Signed)
Spoke with pt today and informed pt re:  Per Dr. Burr Medico, M-protein  0.3  ( unchanged );  CBC, CMET  Normal.   Pt voiced understanding.

## 2015-09-06 ENCOUNTER — Other Ambulatory Visit: Payer: Self-pay | Admitting: Internal Medicine

## 2015-10-04 DIAGNOSIS — M4726 Other spondylosis with radiculopathy, lumbar region: Secondary | ICD-10-CM | POA: Diagnosis not present

## 2015-10-19 ENCOUNTER — Other Ambulatory Visit: Payer: Self-pay | Admitting: Internal Medicine

## 2015-10-19 DIAGNOSIS — M47816 Spondylosis without myelopathy or radiculopathy, lumbar region: Secondary | ICD-10-CM | POA: Diagnosis not present

## 2015-10-20 ENCOUNTER — Other Ambulatory Visit: Payer: Self-pay | Admitting: Family Medicine

## 2015-10-21 MED ORDER — DOXAZOSIN MESYLATE 2 MG PO TABS: 2.0000 mg | ORAL_TABLET | Freq: Every day | ORAL | Status: DC

## 2015-10-21 MED ORDER — FLUTICASONE PROPIONATE 50 MCG/ACT NA SUSP: 2.0000 | NASAL | Status: DC | PRN

## 2015-11-17 DIAGNOSIS — G4733 Obstructive sleep apnea (adult) (pediatric): Secondary | ICD-10-CM | POA: Diagnosis not present

## 2015-11-22 ENCOUNTER — Other Ambulatory Visit (HOSPITAL_BASED_OUTPATIENT_CLINIC_OR_DEPARTMENT_OTHER): Payer: Medicare Other

## 2015-11-22 ENCOUNTER — Encounter: Payer: Self-pay | Admitting: Hematology

## 2015-11-22 ENCOUNTER — Telehealth: Payer: Self-pay | Admitting: Hematology

## 2015-11-22 ENCOUNTER — Ambulatory Visit (HOSPITAL_BASED_OUTPATIENT_CLINIC_OR_DEPARTMENT_OTHER): Payer: Medicare Other | Admitting: Hematology

## 2015-11-22 VITALS — BP 143/73 | HR 64 | Temp 98.3°F | Resp 17 | Ht 74.0 in | Wt 268.2 lb

## 2015-11-22 DIAGNOSIS — R5383 Other fatigue: Secondary | ICD-10-CM | POA: Insufficient documentation

## 2015-11-22 DIAGNOSIS — D696 Thrombocytopenia, unspecified: Secondary | ICD-10-CM

## 2015-11-22 DIAGNOSIS — D472 Monoclonal gammopathy: Secondary | ICD-10-CM

## 2015-11-22 DIAGNOSIS — J069 Acute upper respiratory infection, unspecified: Secondary | ICD-10-CM

## 2015-11-22 DIAGNOSIS — G8929 Other chronic pain: Secondary | ICD-10-CM

## 2015-11-22 DIAGNOSIS — E785 Hyperlipidemia, unspecified: Secondary | ICD-10-CM

## 2015-11-22 DIAGNOSIS — R5382 Chronic fatigue, unspecified: Secondary | ICD-10-CM

## 2015-11-22 DIAGNOSIS — M545 Low back pain: Secondary | ICD-10-CM

## 2015-11-22 DIAGNOSIS — M549 Dorsalgia, unspecified: Secondary | ICD-10-CM

## 2015-11-22 DIAGNOSIS — I10 Essential (primary) hypertension: Secondary | ICD-10-CM

## 2015-11-22 DIAGNOSIS — E0500 Thyrotoxicosis with diffuse goiter without thyrotoxic crisis or storm: Secondary | ICD-10-CM

## 2015-11-22 LAB — COMPREHENSIVE METABOLIC PANEL
ALT: 12 U/L (ref 0–55)
AST: 18 U/L (ref 5–34)
Albumin: 3.7 g/dL (ref 3.5–5.0)
Alkaline Phosphatase: 52 U/L (ref 40–150)
Anion Gap: 7 mEq/L (ref 3–11)
BUN: 13.4 mg/dL (ref 7.0–26.0)
CO2: 24 mEq/L (ref 22–29)
Calcium: 9 mg/dL (ref 8.4–10.4)
Chloride: 109 mEq/L (ref 98–109)
Creatinine: 1.1 mg/dL (ref 0.7–1.3)
EGFR: 81 mL/min/{1.73_m2} — ABNORMAL LOW (ref >90)
Glucose: 105 mg/dl (ref 70–140)
Potassium: 4 mEq/L (ref 3.5–5.1)
Sodium: 139 mEq/L (ref 136–145)
Total Bilirubin: 0.79 mg/dL (ref 0.20–1.20)
Total Protein: 7.1 g/dL (ref 6.4–8.3)

## 2015-11-22 LAB — CBC WITH DIFFERENTIAL/PLATELET
BASO%: 0.5 % (ref 0.0–2.0)
Basophils Absolute: 0 10*3/uL (ref 0.0–0.1)
EOS%: 2.8 % (ref 0.0–7.0)
Eosinophils Absolute: 0.1 10*3/uL (ref 0.0–0.5)
HCT: 40.9 % (ref 38.4–49.9)
HGB: 13.4 g/dL (ref 13.0–17.1)
LYMPH%: 36.7 % (ref 14.0–49.0)
MCH: 29.6 pg (ref 27.2–33.4)
MCHC: 32.7 g/dL (ref 32.0–36.0)
MCV: 90.6 fL (ref 79.3–98.0)
MONO#: 0.4 10*3/uL (ref 0.1–0.9)
MONO%: 7.4 % (ref 0.0–14.0)
NEUT#: 2.5 10*3/uL (ref 1.5–6.5)
NEUT%: 52.6 % (ref 39.0–75.0)
Platelets: 130 10*3/uL — ABNORMAL LOW (ref 140–400)
RBC: 4.51 10*6/uL (ref 4.20–5.82)
RDW: 14 % (ref 11.0–14.6)
WBC: 4.8 10*3/uL (ref 4.0–10.3)
lymph#: 1.8 10*3/uL (ref 0.9–3.3)

## 2015-11-22 MED ORDER — AZITHROMYCIN 250 MG PO TABS: ORAL_TABLET | ORAL | Status: DC

## 2015-11-22 NOTE — Progress Notes (Signed)
[place]  Telephone:([phone]) [phone] Fax:(336) [phone]  Clinic follow Up Note   Patient Care Team: Marletta Lor, MD as PCP - General 11/22/2015  CHIEF COMPLAINTS:  Follow up MGUS  HISTORY OF PRESENTING ILLNESS:  Sanjith Siwek 70 y.o. male is here because of abnormal SPEP and suspicious bone lesion on the CT scan.  He had a fall after he tripped and his face hit a wall on the super bowl night on 12/14/2014. He had a significant bleeding on the face and neck pain and was brought to emergency room for further evaluation by 911. CT scan of head and the neck was obtained which showed soft tissue laceration overlying the right orbit, no fracture. The skin incidentally found scattered small lucencies within the visualized osseous structures, with a 6 mm lucency noticed at the C6. He subsequently follow-up with his primary care physician, who ordered the SPEP which showed a restricted band consistent with monoclonal protein, 0.27 g/dl, the quantitative immunoglobulin IgG, IgA and IgM were within normal limits. He developed neck pain which radiate to bilateral shoulders after the accident, this was further evaluated by cervical and lumbar MRI without contrast. The MRI showed diffuse degenerative changes. The low-density lesion in the C5 vertebral body on the CT wasn't not seen in the MRI. His neck pain radiated to b/l shoulders has resolved now.   He had L4-5 disck back surgery 15 years ago in [place], and he has some chronic back pain since then. It has been getting worse in the past few years. He takes Percocet as needed for the back pain. He otherwise feels well, denies any other pain. He works full-time, and remains to be physically active. He denies any fever, chills, or night sweats. No recent weight loss.   CURRENT TREATMENT: Observation  Interim history: Mr. Amer returns for follow-up. He is accompanied by his wife to the clinic today. He complains of upper respiratory  symptoms for the past 2 weeks, he has moderate cough with clear mucus production, and a mild chest congestion. No fever or chills. His wife recently had a cold also. His wife actually give him amoxicillin twice for his cough, however he developed palpitation after taking the medication. He still has chronic low back pain, he rates at 4-6 out of 10, especially during the day when he is at work. He is seen orthopedic Dr Mina Marble for that and received injections before. He denies any other new pain.  MEDICAL HISTORY:  Past Medical History  Diagnosis Date  . HYPERTHYROIDISM 01/25/2010  . HYPERTENSION 01/25/2010  . ALLERGIC RHINITIS 01/25/2010  . Vitiligo   . Obesity   . Pericarditis 1980's  . 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  . Impaired glucose tolerance   . OSA (obstructive sleep apnea)     Uses CPAP nightly  . Migraines   . Hx of echocardiogram     a.  Echo 02/03/13:  Mild LVH, mild focal basal septal hypertrophy, EF 50-55%, Gr 1 DD, mild LAE  . Hx of cardiovascular stress test     a. Lexiscan Myoview 02/06/13:  EF 59%, no ischemia or scar    SURGICAL HISTORY: Past Surgical History  Procedure Laterality Date  . Hernia repair      umbilical  . Lumbar laminectomy    . Inguinal hernia repair      SOCIAL HISTORY: Social History   Social History  . Marital Status: Married  Spouse Name: N/A  . Number of Children: 2  . Years of Education: N/A   Occupational History  . sales    Social History Main Topics  . Smoking status: Former Smoker -- 8 years    Quit date: 11/06/1966  . Smokeless tobacco: Not on file  . Alcohol Use: Yes     Comment: occasional - max 2x/week  . Drug Use: No  . Sexual Activity: Not on file   Other Topics Concern  . Not on file   Social History Narrative    FAMILY HISTORY: Family History  Problem Relation Age of Onset  . Thyroid disease Sister     goiter  . Stroke Father     Passed away in his 68s  . Sudden death Brother      ? Drug use, no autopsy  . Throat cancer Brother     ALLERGIES:  is allergic to flomax and relpax.  MEDICATIONS:  Current Outpatient Prescriptions  Medication Sig Dispense Refill  . aspirin EC 81 MG tablet Take 1 tablet (81 mg total) by mouth daily.    Marland Kitchen doxazosin (CARDURA) 2 MG tablet Take 1 tablet (2 mg total) by mouth at bedtime. 30 tablet 2  . fluticasone (FLONASE) 50 MCG/ACT nasal spray Place 2 sprays into both nostrils as needed. 16 g 2  . losartan (COZAAR) 100 MG tablet TAKE 1 TABLET DAILY 90 tablet 2  . metoprolol succinate (TOPROL-XL) 100 MG 24 hr tablet TAKE 1 TABLET DAILY 90 tablet 2  . pravastatin (PRAVACHOL) 40 MG tablet TAKE 1 TABLET DAILY 90 tablet 2  . sildenafil (VIAGRA) 100 MG tablet Take 100 mg by mouth daily as needed for erectile dysfunction (erectile dysfunction).     No current facility-administered medications for this visit.    REVIEW OF SYSTEMS:   Constitutional: Denies fevers, chills or abnormal night sweats Eyes: Denies blurriness of vision, double vision or watery eyes Ears, nose, mouth, throat, and face: Denies mucositis or sore throat Respiratory: Denies cough, dyspnea or wheezes Cardiovascular: Denies palpitation, chest discomfort or lower extremity swelling Gastrointestinal:  Denies nausea, heartburn or change in bowel habits Skin: Denies abnormal skin rashes Lymphatics: Denies new lymphadenopathy or easy bruising Neurological:Denies numbness, tingling or new weaknesses Behavioral/Psych: Mood is stable, no new changes  All other systems were reviewed with the patient and are negative.  PHYSICAL EXAMINATION: ECOG PERFORMANCE STATUS: 1 - Symptomatic but completely ambulatory  Filed Vitals:   11/22/15 1238  BP: 143/73  Pulse: 64  Temp: 98.3 F (36.8 C)  Resp: 17   Filed Weights   11/22/15 1238  Weight: 268 lb 3.2 oz (121.655 kg)    GENERAL:alert, no distress and comfortable SKIN: skin color, texture, turgor are normal, no rashes or  significant lesions (+) vitiligo EYES: normal, conjunctiva are pink and non-injected, sclera clear OROPHARYNX:no exudate, no erythema and lips, buccal mucosa, and tongue normal  NECK: supple, thyroid normal size, non-tender, without nodularity LYMPH:  no palpable lymphadenopathy in the cervical, axillary or inguinal LUNGS: clear to auscultation and percussion with normal breathing effort HEART: regular rate & rhythm and no murmurs and no lower extremity edema ABDOMEN:abdomen soft, non-tender and normal bowel sounds Musculoskeletal:no cyanosis of digits and no clubbing  PSYCH: alert & oriented x 3 with fluent speech NEURO: no focal motor/sensory deficits  LABORATORY DATA:  I have reviewed the data as listed CBC Latest Ref Rng 11/22/2015 08/30/2015 05/24/2015  WBC 4.0 - 10.3 10e3/uL 4.8 5.8 5.8  Hemoglobin 13.0 - 17.1  g/dL 13.4 13.7 13.4  Hematocrit 38.4 - 49.9 % 40.9 41.3 40.6  Platelets 140 - 400 10e3/uL 130(L) 141 141    CMP Latest Ref Rng 08/30/2015 05/24/2015 03/02/2015  Glucose 70 - 140 mg/dl 77 103 100(H)  BUN 7.0 - 26.0 mg/dL 14.6 15.0 15  Creatinine 0.7 - 1.3 mg/dL 1.1 1.2 1.02  Sodium 136 - 145 mEq/L 142 140 138  Potassium 3.5 - 5.1 mEq/L 4.0 4.1 3.7  Chloride 96 - 112 mEq/L - - 103  CO2 22 - 29 mEq/L '28 26 31  ' Calcium 8.4 - 10.4 mg/dL 9.6 9.3 9.5  Total Protein 6.4 - 8.3 g/dL 7.6 7.2 -  Total Bilirubin 0.20 - 1.20 mg/dL 1.04 0.73 -  Alkaline Phos 40 - 150 U/L 55 54 -  AST 5 - 34 U/L 21 18 -  ALT 0 - 55 U/L 17 15 -   SPEP/IFE M-protein  05/24/2015: 0.3  PATHOLOGY REPORT  Bone Marrow, Aspirate,Biopsy, and Clot, iliac crest 02/08/2015 - HYPERCELLULAR BONE MARROW FOR AGE WITH TRILINEAGE HEMATOPOIESIS INCLUDING ABUNDANT MEGAKARYOCYTES. - 5% PLASMA CELLS. - SEE COMMENT. PERIPHERAL BLOOD: - NO SIGNIFICANT MORPHOLOGIC ABNORMALITIES. Diagnosis Note The bone marrow is hypercellular for age with trilineage hematopoiesis including abundant megakaryocytes, some of which display  atypical features. The significance of latter finding is uncertain at this time and hence correlation with cytogenetic studies is recommended. The plasma cells represent 5% of all cells in the aspirate associated with kappa light chain excess. Given the relatively small number of plasma cells evaluated, the overall features are considered suggestive but not entirely definitive of plasma cell dyscrasia. Clinical and cytogenetic correlation is recommended. (BNS:ecj 02/10/2015)    RADIOGRAPHIC STUDIES: I have personally reviewed the radiological images as listed and agreed with the findings in the report. Mr Cervical Spine Wo Contrast 01/14/2015    IMPRESSION: Negative for cervical spine fracture  Moderate cervical disc degeneration and spondylosis.  C5 vertebral lesion on the left on CT is difficult to visualize on MRI however no worrisome lesion is seen in this area.   Electronically Signed   By: Franchot Gallo M.D.   On: 01/14/2015 08:04   Mr Lumbar Spine Wo Contrast 01/14/2015    IMPRESSION: Extra foraminal disc contacts the exited left L2 root.  Moderate to moderately severe bilateral foraminal narrowing, worse on the right at L3-4 is due to disc with endplate spurring and facet degenerative disease.  Advanced facet arthropathy at L5-S1 results in 0.4 cm anterolisthesis. There is left much worse than right lateral recess narrowing at this level and moderate to moderately severe bilateral foraminal narrowing.  Moderate to moderately severe bilateral foraminal narrowing at L4-5 appears worse on the right. The central canal and right lateral recess are also mildly narrowed at this level.   Electronically Signed   By: Inge Rise M.D.   On: 01/14/2015 10:02    Bone survey 02/08/2015 IMPRESSION: Subtle lucencies are noted in the skull, cervical spine, and left femoral greater trochanter. Very subtle changes of myeloma cannot be completely excluded.   ASSESSMENT & PLAN:  70 year old  African-American male, with past medical history of Graves' disease, vitiligo, hypertension, dyslipidemia, was incidentally 2 found a small hypodense lesion in C5 on CT scan. Lab work reviewed a monoclonal paraprotein.  1. URI -He has had some productive cough in the past 2 weeks, no fever or chills, white count is normal, this is likely Barrett's infection or atypical bronchitis -I give her a prescription of azithromycin to 250  mg, 2 tablets on first day, then 1 tablet daily for 4 more days. -I encouraged him to follow-up with his primary care physician if his symptom does not improve   2. MGUS -His SPEP reveal a low-level of monoclonal paraprotein (0.27g/dl), immunoglobulin level were normal, he does not have anemia, renal dysfunction, hypercalcemia on the lab test.  -His CT scan revealed a small hyper lytic lesion in the C5 vertebral body however was not obvious on the MRI scan. -A bone survey showed subtle lucencies in multiple bones, indeterminate.  -His bone marrow biopsy showed 5% plasma cells, suggestive of plasma cell dyscrasia. Cytogenetics was normal. -We discussed the natural history of MGUS, and small possibility (1% every year) of developing multiple myeloma. I recommend routine follow-up every 3-6 months.   -His repeated lab in July 2016 showed stable M protein, and normal CBC CMP. -I think his back pain and fatigue unlikely related to his MGUS, lumbar spine in March 2016 showed stenosis, no lytic lesions.  3. Mild thrombocytopenia -The platelet count has been in the range of 130 to 153 in the past 2 years, stable overall. -He does have other autoimmune disease including Graves' disease and vitiligo.  - His bone marrow biopsy showed abundant megakaryocytes, some of which display atypical features, the significance is uncertain. -We'll continue follow-up his CBC. Platelet count 130K today   4. Chronic back pain -His lumbar MRI in March 2016 showed severe degenerative changes,  which is likely the cause of his back pain. -I strongly encouraged him to follow-up with his sports medicine Dr. Mina Marble   5. Fatigue -possible relate to his back pain  -I encouraged him to follow-up with his primary care physician. His TSH was normal early disease.  6. Hypertension, dyslipidemia -He'll continue follow-up with his primary care physician  Plan -Azithromycin for his URI -Return to clinic in 6 months with lab  -I will call with his SPEP results   All questions were answered. The patient knows to call the clinic with any problems, questions or concerns. I spent 25 minutes counseling the patient face to face. The total time spent in the appointment was 30 minutes and more than 50% was on counseling.     Truitt Merle, MD 11/22/2015 1:07 PM

## 2015-11-22 NOTE — Telephone Encounter (Signed)
Gave patient avs report and appointments for July.  °

## 2015-11-23 LAB — MULTIPLE MYELOMA PANEL, SERUM
Albumin SerPl Elph-Mcnc: 3.5 g/dL (ref 2.9–4.4)
Albumin/Glob SerPl: 1.1 (ref 0.7–1.7)
Alpha 1: 0.2 g/dL (ref 0.0–0.4)
Alpha2 Glob SerPl Elph-Mcnc: 0.9 g/dL (ref 0.4–1.0)
B-Globulin SerPl Elph-Mcnc: 1 g/dL (ref 0.7–1.3)
Gamma Glob SerPl Elph-Mcnc: 1.2 g/dL (ref 0.4–1.8)
Globulin, Total: 3.3 g/dL (ref 2.2–3.9)
IgA, Qn, Serum: 118 mg/dL (ref 61–437)
IgG, Qn, Serum: 1123 mg/dL (ref 700–1600)
IgM, Qn, Serum: 56 mg/dL (ref 20–172)
M Protein SerPl Elph-Mcnc: 0.3 g/dL — ABNORMAL HIGH
Total Protein: 6.8 g/dL (ref 6.0–8.5)

## 2015-11-23 LAB — KAPPA/LAMBDA LIGHT CHAINS
Ig Kappa Free Light Chain: 11.84 mg/L (ref 3.30–19.40)
Ig Lambda Free Light Chain: 9.17 mg/L (ref 5.71–26.30)
Kappa/Lambda FluidC Ratio: 1.29 (ref 0.26–1.65)

## 2016-01-03 ENCOUNTER — Ambulatory Visit: Payer: Medicare Other | Admitting: Internal Medicine

## 2016-01-17 ENCOUNTER — Other Ambulatory Visit: Payer: Self-pay | Admitting: *Deleted

## 2016-01-17 MED ORDER — DOXAZOSIN MESYLATE 2 MG PO TABS
2.0000 mg | ORAL_TABLET | Freq: Every day | ORAL | Status: DC
Start: 2016-01-17 — End: 2016-04-17

## 2016-02-23 ENCOUNTER — Telehealth: Payer: Self-pay | Admitting: Hematology

## 2016-02-23 ENCOUNTER — Other Ambulatory Visit: Payer: Self-pay | Admitting: *Deleted

## 2016-02-23 ENCOUNTER — Telehealth: Payer: Self-pay | Admitting: *Deleted

## 2016-02-23 DIAGNOSIS — G8929 Other chronic pain: Secondary | ICD-10-CM

## 2016-02-23 DIAGNOSIS — D472 Monoclonal gammopathy: Secondary | ICD-10-CM

## 2016-02-23 DIAGNOSIS — M549 Dorsalgia, unspecified: Secondary | ICD-10-CM

## 2016-02-23 NOTE — Telephone Encounter (Signed)
s.w. pt and advised on 4.24 appt....pt ok and aware

## 2016-02-23 NOTE — Telephone Encounter (Signed)
Received vm call from pt's wife, Shauna Hugh, stating that pt is in so much pain that he is unable to stand.  He was refusing to call b/c he is worried about money.  She would like a call back to let her know what to do. Discussed with Dr Burr Medico & suggested to Diane to call PCP or Dr Mina Marble.  She reports that pt was injected in back by Dr Mina Marble & it didn't do any good.  Encouraged her to call somebody, PCP if not Dr Mina Marble for pain med.  Dr Burr Medico suggested to repeat labs 3 mo after last labs & bone survey.  Orders placed.  Encouraged to take pt to ED if symptoms worse or trouble voiding or having BM.

## 2016-02-28 ENCOUNTER — Ambulatory Visit (HOSPITAL_COMMUNITY)
Admission: RE | Admit: 2016-02-28 | Discharge: 2016-02-28 | Disposition: A | Discharge status: Home / Self Care | Payer: Medicare Other | Source: Ambulatory Visit | Attending: Hematology | Admitting: Hematology

## 2016-02-28 ENCOUNTER — Other Ambulatory Visit (HOSPITAL_BASED_OUTPATIENT_CLINIC_OR_DEPARTMENT_OTHER): Payer: Medicare Other

## 2016-02-28 DIAGNOSIS — G8929 Other chronic pain: Secondary | ICD-10-CM

## 2016-02-28 DIAGNOSIS — D472 Monoclonal gammopathy: Secondary | ICD-10-CM

## 2016-02-28 DIAGNOSIS — M549 Dorsalgia, unspecified: Secondary | ICD-10-CM | POA: Diagnosis not present

## 2016-02-28 DIAGNOSIS — R930 Abnormal findings on diagnostic imaging of skull and head, not elsewhere classified: Secondary | ICD-10-CM | POA: Insufficient documentation

## 2016-02-28 LAB — CBC WITH DIFFERENTIAL/PLATELET
BASO%: 0.5 % (ref 0.0–2.0)
Basophils Absolute: 0 10*3/uL (ref 0.0–0.1)
EOS%: 3.4 % (ref 0.0–7.0)
Eosinophils Absolute: 0.2 10*3/uL (ref 0.0–0.5)
HCT: 40 % (ref 38.4–49.9)
HGB: 13.1 g/dL (ref 13.0–17.1)
LYMPH%: 30.6 % (ref 14.0–49.0)
MCH: 29.7 pg (ref 27.2–33.4)
MCHC: 32.7 g/dL (ref 32.0–36.0)
MCV: 91 fL (ref 79.3–98.0)
MONO#: 0.4 10*3/uL (ref 0.1–0.9)
MONO%: 7.5 % (ref 0.0–14.0)
NEUT#: 3.1 10*3/uL (ref 1.5–6.5)
NEUT%: 58 % (ref 39.0–75.0)
Platelets: 129 10*3/uL — ABNORMAL LOW (ref 140–400)
RBC: 4.4 10*6/uL (ref 4.20–5.82)
RDW: 14.5 % (ref 11.0–14.6)
WBC: 5.4 10*3/uL (ref 4.0–10.3)
lymph#: 1.7 10*3/uL (ref 0.9–3.3)

## 2016-02-28 LAB — COMPREHENSIVE METABOLIC PANEL
ALT: 19 U/L (ref 0–55)
AST: 22 U/L (ref 5–34)
Albumin: 3.7 g/dL (ref 3.5–5.0)
Alkaline Phosphatase: 51 U/L (ref 40–150)
Anion Gap: 8 mEq/L (ref 3–11)
BUN: 16.5 mg/dL (ref 7.0–26.0)
CO2: 24 mEq/L (ref 22–29)
Calcium: 9 mg/dL (ref 8.4–10.4)
Chloride: 109 mEq/L (ref 98–109)
Creatinine: 1 mg/dL (ref 0.7–1.3)
EGFR: 90 mL/min/{1.73_m2} — ABNORMAL LOW (ref >90)
Glucose: 109 mg/dl (ref 70–140)
Potassium: 3.9 mEq/L (ref 3.5–5.1)
Sodium: 140 mEq/L (ref 136–145)
Total Bilirubin: 0.64 mg/dL (ref 0.20–1.20)
Total Protein: 7.1 g/dL (ref 6.4–8.3)

## 2016-02-29 LAB — KAPPA/LAMBDA LIGHT CHAINS
Ig Kappa Free Light Chain: 13.81 mg/L (ref 3.30–19.40)
Ig Lambda Free Light Chain: 10.01 mg/L (ref 5.71–26.30)
Kappa/Lambda FluidC Ratio: 1.38 (ref 0.26–1.65)

## 2016-03-01 LAB — PROTEIN ELECTROPHORESIS, SERUM
A/G Ratio: 1.2 (ref 0.7–1.7)
Albumin: 3.7 g/dL (ref 2.9–4.4)
Alpha 1: 0.2 g/dL (ref 0.0–0.4)
Alpha 2: 0.8 g/dL (ref 0.4–1.0)
Beta: 1 g/dL (ref 0.7–1.3)
Gamma Globulin: 1.1 g/dL (ref 0.4–1.8)
Globulin, Total: 3 g/dL (ref 2.2–3.9)
M-Spike, %: 0.2 g/dL — ABNORMAL HIGH
Total Protein: 6.7 g/dL (ref 6.0–8.5)

## 2016-03-09 ENCOUNTER — Telehealth: Payer: Self-pay | Admitting: Hematology

## 2016-03-09 ENCOUNTER — Other Ambulatory Visit: Payer: Self-pay | Admitting: Hematology

## 2016-03-09 NOTE — Telephone Encounter (Signed)
I called the patient back, and discussed his lab and bone survey results from last week. His M protein was 0.2, slightly lower than before, light chain levels are normal. Bone survey showed no definitive lytic bone lesions. Degenerative changes were noticed in his spine. I think his back pain was probably related to years degenerative arthritis, no suspicion for multiple myeloma. He voiced good understanding about the above, and appreciated the call.  I will reschedule his try appointment to 4 months later.   Truitt Merle  03/09/2016

## 2016-03-10 ENCOUNTER — Encounter: Payer: Medicare Other | Admitting: Internal Medicine

## 2016-03-10 ENCOUNTER — Telehealth: Payer: Self-pay | Admitting: Hematology

## 2016-03-10 NOTE — Telephone Encounter (Signed)
cld pt to adv appt moved out 4 mths per Dr Vincent Peyer answer-mailed avs

## 2016-03-13 ENCOUNTER — Other Ambulatory Visit: Payer: Medicare Other

## 2016-03-13 ENCOUNTER — Ambulatory Visit: Payer: Medicare Other | Admitting: *Deleted

## 2016-03-14 ENCOUNTER — Ambulatory Visit: Payer: Medicare Other | Admitting: *Deleted

## 2016-03-14 ENCOUNTER — Other Ambulatory Visit: Payer: Medicare Other

## 2016-03-20 ENCOUNTER — Encounter: Payer: Self-pay | Admitting: Internal Medicine

## 2016-03-20 ENCOUNTER — Ambulatory Visit (INDEPENDENT_AMBULATORY_CARE_PROVIDER_SITE_OTHER): Payer: Medicare Other | Admitting: Internal Medicine

## 2016-03-20 VITALS — BP 120/80 | HR 76 | Temp 98.0°F | Resp 20 | Ht 74.0 in | Wt 265.0 lb

## 2016-03-20 DIAGNOSIS — E0500 Thyrotoxicosis with diffuse goiter without thyrotoxic crisis or storm: Secondary | ICD-10-CM | POA: Diagnosis not present

## 2016-03-20 DIAGNOSIS — E785 Hyperlipidemia, unspecified: Secondary | ICD-10-CM

## 2016-03-20 DIAGNOSIS — Z23 Encounter for immunization: Secondary | ICD-10-CM | POA: Diagnosis not present

## 2016-03-20 DIAGNOSIS — Z0000 Encounter for general adult medical examination without abnormal findings: Secondary | ICD-10-CM

## 2016-03-20 DIAGNOSIS — I10 Essential (primary) hypertension: Secondary | ICD-10-CM | POA: Diagnosis not present

## 2016-03-20 DIAGNOSIS — J3089 Other allergic rhinitis: Secondary | ICD-10-CM | POA: Diagnosis not present

## 2016-03-20 DIAGNOSIS — R7302 Impaired glucose tolerance (oral): Secondary | ICD-10-CM

## 2016-03-20 DIAGNOSIS — G4733 Obstructive sleep apnea (adult) (pediatric): Secondary | ICD-10-CM

## 2016-03-20 NOTE — Patient Instructions (Signed)
Limit your sodium (Salt) intake    It is important that you exercise regularly, at least 20 minutes 3 to 4 times per week.  If you develop chest pain or shortness of breath seek  medical attention.  You need to lose weight.  Consider a lower calorie diet and regular exercise.  Confirm date of your prior colonoscopy  Please check your blood pressure on a regular basis.  If it is consistently greater than 150/90, please make an office appointment.  Return in one year for follow-up

## 2016-03-20 NOTE — Progress Notes (Signed)
Subjective:     Patient ID: Gordon Phillips, male   DOB: December 04, 1945, 70 y.o.   MRN: [id number]  HPI      History of Present Illness:   70   year-old patient who is seen today for a comprehensive annual exam;   he has an approximate 10 year history of treated hypertension.  Medical problems include the hypertension, history of Graves' disease, history of pericarditis in the 29s and also a history of seasonal allergic rhinitis.  He has been evaluated by pulmonary medicine due to OSA. He has a history of impaired glucose tolerance, and migraine headaches.  He remains on pravastatin for dyslipidemia.   A head CT in 2014 revealed a small left lacunar basal ganglia infarct, as well as chronic microvascular changes He has chronic low back pain and is followed by hematology for MGUS9   Preventive Screening-Counseling & Management  Alcohol-Tobacco  Smoking Status: quit   Allergies (verified):  No Known Drug Allergies   Past History:   Hypertension  Hyperthyroidism history of Graves' disease, status post I-131 1980s  vitiligo  obesity  history of borderline glaucoma  Obstructive sleep apnea  pericarditis 1980s  Allergic rhinitis  Cerebrovascular disease Migraine headaches  Past Surgical History:  Inguinal herniorrhaphy  Lumbar laminectomy  status post umbilical hernia repair  colonoscopy 2009   Family History: father died at 20. complications of cerebral vascular disease  mother age 88, lives independently  two brothers, one died complications. No cancer  one sister, history of thyroid disease, and goiter   Social History:   Married  Press photographer presently unemployed   two daughters, one with a history of breast cancerSmoking Status: quit   Past Medical History  Diagnosis Date  . HYPERTHYROIDISM 01/25/2010  . HYPERTENSION 01/25/2010  . ALLERGIC RHINITIS 01/25/2010  . Vitiligo   . Obesity   . Pericarditis 1980's  . 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  .  Impaired glucose tolerance   . OSA (obstructive sleep apnea)     Uses CPAP nightly  . Migraines   . Hx of echocardiogram     a.  Echo 02/03/13:  Mild LVH, mild focal basal septal hypertrophy, EF 50-55%, Gr 1 DD, mild LAE  . Hx of cardiovascular stress test     a. Lexiscan Myoview 02/06/13:  EF 59%, no ischemia or scar    Social History   Social History  . Marital Status: Married    Spouse Name: N/A  . Number of Children: 2  . Years of Education: N/A   Occupational History  . sales    Social History Main Topics  . Smoking status: Former Smoker -- 8 years    Quit date: 11/06/1966  . Smokeless tobacco: Not on file  . Alcohol Use: Yes     Comment: occasional - max 2x/week  . Drug Use: No  . Sexual Activity: Not on file   Other Topics Concern  . Not on file   Social History Narrative    Past Surgical History  Procedure Laterality Date  . Hernia repair      umbilical  . Lumbar laminectomy    . Inguinal hernia repair      Family History  Problem Relation Age of Onset  . Thyroid disease Sister     goiter  . Stroke Father     Passed away in his 38s  . Sudden death Brother     ? Drug use, no autopsy  .  Throat cancer Brother     Allergies  Allergen Reactions  . Flomax [Tamsulosin Hcl] Itching  . Relpax [Eletriptan]     Chest pain, hypertension, sweating    Current Outpatient Prescriptions on File Prior to Visit  Medication Sig Dispense Refill  . aspirin EC 81 MG tablet Take 1 tablet (81 mg total) by mouth daily.    Marland Kitchen doxazosin (CARDURA) 2 MG tablet Take 1 tablet (2 mg total) by mouth at bedtime. 90 tablet 0  . fluticasone (FLONASE) 50 MCG/ACT nasal spray Place 2 sprays into both nostrils as needed. 16 g 2  . losartan (COZAAR) 100 MG tablet TAKE 1 TABLET DAILY 90 tablet 2  . metoprolol succinate (TOPROL-XL) 100 MG 24 hr tablet TAKE 1 TABLET DAILY 90 tablet 2  . pravastatin (PRAVACHOL) 40 MG tablet TAKE 1 TABLET DAILY 90 tablet 2  . sildenafil (VIAGRA) 100 MG  tablet Take 100 mg by mouth daily as needed for erectile dysfunction (erectile dysfunction).     No current facility-administered medications on file prior to visit.    BP 120/80 mmHg  Pulse 76  Temp(Src) 98 F (36.7 C) (Oral)  Resp 20  Ht 6\' 2"  (1.88 m)  Wt 265 lb (120.203 kg)  BMI 34.01 kg/m2  SpO2 97%    1. Risk factors, based on past  M,S,F history-  cardiovascular risk factors include hypertension.  2.  Physical activities: No activity restrictions, but fairly sedentary  3.  Depression/mood: No history depression or mood disorder  4.  Hearing: No deficits  5.  ADL's: Totally independent in all aspects of daily living  6.  Fall risk: Low  7.  Home safety: No problems identified  8.  Height weight, and visual acuity; height and weight stable no change in visual acuity  9.  Counseling: Weight loss encouraged regular exercise encouraged  10. Lab orders based on risk factors: Laboratory profile including lipid panel reviewed  11. Referral : Follow-up hematology.  May need follow-up colonoscopy  12. Care plan: Weight loss exercise Gen. lifestyle modification discussed  13. Cognitive assessment: Alert and oriented with normal affect. No cognitive dysfunction.  14.  Preventive services include annual health maintenance examinations.  Will continue annual clinical exam with screening lab.  Follow-up colonoscopy if it has been greater than 10 years   15.  Provider list includes cardiology, and primary care medicine as well as hematology.  He has annual eye examinations.  Is followed periodically by GI     Review of Systems  Constitutional: Negative for fever, chills, activity change, appetite change and fatigue.  HENT: Negative for congestion, dental problem, ear pain, hearing loss, mouth sores, rhinorrhea, sinus pressure, sneezing, tinnitus, trouble swallowing and voice change.   Eyes: Negative for photophobia, pain, redness and visual disturbance.  Respiratory:  Negative for apnea, cough, choking, chest tightness, shortness of breath and wheezing.   Cardiovascular: Negative for chest pain, palpitations and leg swelling.  Gastrointestinal: Negative for nausea, vomiting, abdominal pain, diarrhea, constipation, blood in stool, abdominal distention, anal bleeding and rectal pain.  Genitourinary: Negative for dysuria, urgency, frequency, hematuria, flank pain, decreased urine volume, discharge, penile swelling, scrotal swelling, difficulty urinating, genital sores and testicular pain.  Musculoskeletal: Negative for myalgias, back pain, joint swelling, arthralgias, gait problem, neck pain and neck stiffness.  Skin: Negative for color change, rash and wound.  Neurological: Negative for dizziness, tremors, seizures, syncope, facial asymmetry, speech difficulty, weakness, light-headedness, numbness and headaches.  Hematological: Negative for adenopathy. Does not bruise/bleed easily.  Psychiatric/Behavioral: Negative for suicidal ideas, hallucinations, behavioral problems, confusion, sleep disturbance, self-injury, dysphoric mood, decreased concentration and agitation. The patient is not nervous/anxious.        Objective:   Physical Exam  Constitutional: He appears well-developed and well-nourished.  Weight 265  HENT:  Head: Normocephalic and atraumatic.  Right Ear: External ear normal.  Left Ear: External ear normal.  Nose: Nose normal.  Mouth/Throat: Oropharynx is clear and moist.  Eyes: Conjunctivae and EOM are normal. Pupils are equal, round, and reactive to light. No scleral icterus.  Neck: Normal range of motion. Neck supple. No JVD present. No thyromegaly present.  Cardiovascular: Regular rhythm, normal heart sounds and intact distal pulses.  Exam reveals no gallop and no friction rub.   No murmur heard. Dorsalis pedis pulses plus 3 Posterior tibial pulses plus 1  Pulmonary/Chest: Effort normal and breath sounds normal. He exhibits no tenderness.   Abdominal: Soft. Bowel sounds are normal. He exhibits no distension and no mass. There is no tenderness.  Genitourinary: Prostate normal and penis normal.  Musculoskeletal: Normal range of motion. He exhibits no edema or tenderness.  Lymphadenopathy:    He has no cervical adenopathy.  Neurological: He is alert. He has normal reflexes. No cranial nerve deficit. Coordination normal.  Skin: Skin is warm and dry. No rash noted.  Prominent vitiligo  Psychiatric: He has a normal mood and affect. His behavior is normal.       Assessment:     Preventive health exam Hypertension stable Exogenous obesity  Cerebrovascular disease.  Will continue aggressive risk factor modification.  We'll continue blood pressure regimen daily, aspirin, and statin therapy  Impaired glucose tolerance     Plan:     Weight loss encouraged Regular exercise encouraged We'll continue present regimen Medications refilled  We'll check lipid profile and TSH PSA testing discussed at length.  Patient wishes to defer at this time

## 2016-03-20 NOTE — Progress Notes (Signed)
Pre visit review using our clinic review tool, if applicable. No additional management support is needed unless otherwise documented below in the visit note. 

## 2016-03-21 LAB — LIPID PANEL
Cholesterol: 144 mg/dL (ref 0–200)
HDL: 41.7 mg/dL (ref >39.00)
LDL Cholesterol: 95 mg/dL (ref 0–99)
NonHDL: 102.69
Total CHOL/HDL Ratio: 3
Triglycerides: 40 mg/dL (ref 0.0–149.0)
VLDL: 8 mg/dL (ref 0.0–40.0)

## 2016-03-21 LAB — TSH: TSH: 0.65 u[IU]/mL (ref 0.35–4.50)

## 2016-04-17 ENCOUNTER — Other Ambulatory Visit: Payer: Self-pay | Admitting: Internal Medicine

## 2016-04-17 NOTE — Telephone Encounter (Signed)
Refill sent to pharmacy.   

## 2016-05-06 DIAGNOSIS — G4733 Obstructive sleep apnea (adult) (pediatric): Secondary | ICD-10-CM | POA: Diagnosis not present

## 2016-05-15 ENCOUNTER — Other Ambulatory Visit: Payer: Medicare Other

## 2016-05-22 ENCOUNTER — Ambulatory Visit: Payer: Medicare Other | Admitting: Hematology

## 2016-06-15 DIAGNOSIS — M25511 Pain in right shoulder: Secondary | ICD-10-CM | POA: Diagnosis not present

## 2016-06-22 ENCOUNTER — Telehealth: Payer: Self-pay | Admitting: Internal Medicine

## 2016-06-22 NOTE — Telephone Encounter (Signed)
Spoke with Mrs Frierson & suggested that Mr Caravantes may benefit from using a Out Patient facility. Suggested [facility] since Mr Drax can't make our business hours. Gave phone #  & Address.

## 2016-06-22 NOTE — Telephone Encounter (Signed)
Pt has been scheduled in the 4pm slot on Friday.

## 2016-06-22 NOTE — Telephone Encounter (Signed)
Spoke with Mrs Corpe & suggested that Mr Lozoya try an Outpatient facility since his work hours will not permit him to visit our office during regular business hours. Suggested [facility] gave her the Business hours & phone # along with the address.

## 2016-06-22 NOTE — Telephone Encounter (Addendum)
Pt has possible sinus infection, has bright green mucus and would like dr k to call in abx. Advised pt he may need appointment, but states he cannot find the time to come in this week. Would like a rx called in to [facility] on [address]

## 2016-06-23 ENCOUNTER — Ambulatory Visit (INDEPENDENT_AMBULATORY_CARE_PROVIDER_SITE_OTHER): Payer: Medicare Other | Admitting: Internal Medicine

## 2016-06-23 ENCOUNTER — Encounter: Payer: Self-pay | Admitting: Internal Medicine

## 2016-06-23 VITALS — BP 110/80 | HR 70 | Temp 98.1°F | Ht 74.0 in | Wt 270.0 lb

## 2016-06-23 DIAGNOSIS — Z0000 Encounter for general adult medical examination without abnormal findings: Secondary | ICD-10-CM

## 2016-06-23 DIAGNOSIS — J3089 Other allergic rhinitis: Secondary | ICD-10-CM

## 2016-06-23 DIAGNOSIS — G4733 Obstructive sleep apnea (adult) (pediatric): Secondary | ICD-10-CM | POA: Diagnosis not present

## 2016-06-23 DIAGNOSIS — I10 Essential (primary) hypertension: Secondary | ICD-10-CM

## 2016-06-23 NOTE — Progress Notes (Signed)
Subjective:    Patient ID: Gordon Phillips, male    DOB: 02/04/1946, 70 y.o.   MRN: [id number]  HPI 70 year old patient with a history of essential hypertension.  He also has a history of allergic rhinitis.  For the past couple weeks she has had some sinus congestion, drainage and a general sense of unwellness.  He has improved nicely, but yesterday noted considerable discolored sinus drainage.  No fever.  He is using fluticasone nasal spray as well as guaifenesin.  Past Medical History:  Diagnosis Date  . ALLERGIC RHINITIS 01/25/2010  . Hx of cardiovascular stress test    a. Lexiscan Myoview 02/06/13:  EF 59%, no ischemia or scar  . Hx of echocardiogram    a.  Echo 02/03/13:  Mild LVH, mild focal basal septal hypertrophy, EF 50-55%, Gr 1 DD, mild LAE  . HYPERTENSION 01/25/2010  . HYPERTHYROIDISM 01/25/2010  . Impaired glucose tolerance   . Migraines   . Obesity   . OSA (obstructive sleep apnea)    Uses CPAP nightly  . Pericarditis 1980's  . 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  Marland Kitchen Vitiligo      Social History   Social History  . Marital status: Married    Spouse name: N/A  . Number of children: 2  . Years of education: N/A   Occupational History  . sales    Social History Main Topics  . Smoking status: Former Smoker    Years: 8.00    Quit date: 11/06/1966  . Smokeless tobacco: Not on file  . Alcohol use Yes     Comment: occasional - max 2x/week  . Drug use: No  . Sexual activity: Not on file   Other Topics Concern  . Not on file   Social History Narrative  . No narrative on file    Past Surgical History:  Procedure Laterality Date  . HERNIA REPAIR     umbilical  . INGUINAL HERNIA REPAIR    . LUMBAR LAMINECTOMY      Family History  Problem Relation Age of Onset  . Thyroid disease Sister     goiter  . Stroke Father     Passed away in his 40s  . Sudden death Brother     ? Drug use, no autopsy  . Throat cancer Brother     Allergies    Allergen Reactions  . Flomax [Tamsulosin Hcl] Itching  . Relpax [Eletriptan]     Chest pain, hypertension, sweating    Current Outpatient Prescriptions on File Prior to Visit  Medication Sig Dispense Refill  . aspirin EC 81 MG tablet Take 1 tablet (81 mg total) by mouth daily.    Marland Kitchen doxazosin (CARDURA) 2 MG tablet Take 1 tablet (2 mg total) by mouth at bedtime. 90 tablet 3  . fluticasone (FLONASE) 50 MCG/ACT nasal spray Place 2 sprays into both nostrils as needed. 16 g 2  . losartan (COZAAR) 100 MG tablet TAKE 1 TABLET DAILY 90 tablet 2  . metoprolol succinate (TOPROL-XL) 100 MG 24 hr tablet TAKE 1 TABLET DAILY 90 tablet 2  . pravastatin (PRAVACHOL) 40 MG tablet TAKE 1 TABLET DAILY 90 tablet 2  . sildenafil (VIAGRA) 100 MG tablet Take 100 mg by mouth daily as needed for erectile dysfunction (erectile dysfunction).     No current facility-administered medications on file prior to visit.     BP 110/80 (BP Location: Left Arm, Patient Position: Sitting, Cuff Size: Normal)  Pulse 70   Temp 98.1 F (36.7 C) (Oral)   Ht 6\' 2"  (1.88 m)   Wt 270 lb (122.5 kg)   SpO2 96%   BMI 34.67 kg/m      Review of Systems  Constitutional: Negative for appetite change, chills, fatigue and fever.  HENT: Positive for congestion, postnasal drip and rhinorrhea. Negative for dental problem, ear pain, hearing loss, sore throat, tinnitus, trouble swallowing and voice change.   Eyes: Negative for pain, discharge and visual disturbance.  Respiratory: Negative for cough, chest tightness, wheezing and stridor.   Cardiovascular: Negative for chest pain, palpitations and leg swelling.  Gastrointestinal: Negative for abdominal distention, abdominal pain, blood in stool, constipation, diarrhea, nausea and vomiting.  Genitourinary: Negative for difficulty urinating, discharge, flank pain, genital sores, hematuria and urgency.  Musculoskeletal: Negative for arthralgias, back pain, gait problem, joint swelling,  myalgias and neck stiffness.  Skin: Negative for rash.  Neurological: Negative for dizziness, syncope, speech difficulty, weakness, numbness and headaches.  Hematological: Negative for adenopathy. Does not bruise/bleed easily.  Psychiatric/Behavioral: Negative for behavioral problems and dysphoric mood. The patient is not nervous/anxious.        Objective:   Physical Exam  Constitutional: He is oriented to person, place, and time. He appears well-developed.  HENT:  Head: Normocephalic.  Right Ear: External ear normal.  Left Ear: External ear normal.  No sinus tenderness  Eyes: Conjunctivae and EOM are normal.  Neck: Normal range of motion.  Cardiovascular: Normal rate and normal heart sounds.   Pulmonary/Chest: Breath sounds normal.  Abdominal: Bowel sounds are normal.  Musculoskeletal: Normal range of motion. He exhibits no edema or tenderness.  Neurological: He is alert and oriented to person, place, and time.  Psychiatric: He has a normal mood and affect. His behavior is normal.          Assessment & Plan:   Essential hypertension, allergic rhinitis Resolving viral URI.  Will continue symptomatic treatment, hydration and expectorants  Preventive health.  Patient checked with his physicians in [place] he has had a prior colonoscopy but it was recommended he return in 3 years following his last colonoscopy.  We'll schedule locally  Nyoka Cowden, MD

## 2016-06-23 NOTE — Patient Instructions (Addendum)
Acute sinusitis symptoms are generally not helped by antibiotic therapy.  Use saline irrigation, warm  moist compresses and over-the-counter decongestants only as directed.  Call if there is no improvement in 5 to 7 days, or sooner if you develop increasing pain, fever, or any new symptoms.  HOME CARE INSTRUCTIONS  Drink plenty of water. Water helps thin the mucus so your sinuses can drain more easily.  Use a humidifier.  Inhale steam 3-4 times a day (for example, sit in the bathroom with the shower running).  Apply a warm, moist washcloth to your face 3-4 times a day, or as directed by your health care provider.  Use saline nasal sprays to help moisten and clean your sinuses.

## 2016-07-03 ENCOUNTER — Encounter: Payer: Self-pay | Admitting: Internal Medicine

## 2016-07-03 ENCOUNTER — Telehealth: Payer: Self-pay | Admitting: Hematology

## 2016-07-03 NOTE — Telephone Encounter (Signed)
PATIENT CALLED TO [facility] APPT DATES. 07/03/16

## 2016-07-04 ENCOUNTER — Ambulatory Visit (INDEPENDENT_AMBULATORY_CARE_PROVIDER_SITE_OTHER): Payer: Medicare Other | Admitting: Pulmonary Disease

## 2016-07-04 ENCOUNTER — Other Ambulatory Visit: Payer: Medicare Other

## 2016-07-04 ENCOUNTER — Encounter: Payer: Self-pay | Admitting: Pulmonary Disease

## 2016-07-04 DIAGNOSIS — G4733 Obstructive sleep apnea (adult) (pediatric): Secondary | ICD-10-CM

## 2016-07-04 NOTE — Assessment & Plan Note (Signed)
Continue to work on weight loss as there is a direct link between weight loss and positive impact on sleep apnea.

## 2016-07-04 NOTE — Progress Notes (Signed)
History of Present Illness Gordon Phillips is a 70 y.o. male  with obstructive sleep apnea Mod- AHI 28/h- on CPAP 11 cm    07/04/2016 Pt. Presents today for six month follow up OSA: Pt. Presents to the office for CPAP follow up. He loves his CPAP. He is compliant  Wears his device for an average of 6.5 hours a night. Great compliance. He states that he does not have daytime sleepiness, slight increase in leaks , but patient states he would like to use the nose mask, vs the full face mask. He feels he is benefiting from the treatment.Pt. Has no other complaints today. He states he did have  A sinusitis about 1 week ago. He saw his PCP, who did not treat with antibiotics. He denies fever, chest pain, orthopnea or hemoptysis. He is still coughing some white secretions which are moderately thick. He states this is getting better on its own.   Tests Down Load: 06/03/16-07/02/16 Worn 29/30 days ( 97% ) > 4 hours 29 days. Average use>> 6 hours 14 minutes Set Pressure >> 11 cm H2O AHI>> 0.9   Past medical hx Past Medical History:  Diagnosis Date  . ALLERGIC RHINITIS 01/25/2010  . Hx of cardiovascular stress test    a. Lexiscan Myoview 02/06/13:  EF 59%, no ischemia or scar  . Hx of echocardiogram    a.  Echo 02/03/13:  Mild LVH, mild focal basal septal hypertrophy, EF 50-55%, Gr 1 DD, mild LAE  . HYPERTENSION 01/25/2010  . HYPERTHYROIDISM 01/25/2010  . Impaired glucose tolerance   . Migraines   . Obesity   . OSA (obstructive sleep apnea)    Uses CPAP nightly  . Pericarditis 1980's  . 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  Marland Kitchen Vitiligo      Past surgical hx, Family hx, Social hx all reviewed.  Current Outpatient Prescriptions on File Prior to Visit  Medication Sig  . aspirin EC 81 MG tablet Take 1 tablet (81 mg total) by mouth daily.  Marland Kitchen doxazosin (CARDURA) 2 MG tablet Take 1 tablet (2 mg total) by mouth at bedtime.  . fluticasone (FLONASE) 50 MCG/ACT nasal spray Place 2  sprays into both nostrils as needed.  Marland Kitchen losartan (COZAAR) 100 MG tablet TAKE 1 TABLET DAILY  . metoprolol succinate (TOPROL-XL) 100 MG 24 hr tablet TAKE 1 TABLET DAILY  . pravastatin (PRAVACHOL) 40 MG tablet TAKE 1 TABLET DAILY  . predniSONE (STERAPRED UNI-PAK 21 TAB) 5 MG (21) TBPK tablet See admin instructions.  . sildenafil (VIAGRA) 100 MG tablet Take 100 mg by mouth daily as needed for erectile dysfunction (erectile dysfunction).   No current facility-administered medications on file prior to visit.      Allergies  Allergen Reactions  . Flomax [Tamsulosin Hcl] Itching  . Relpax [Eletriptan]     Chest pain, hypertension, sweating    Review Of Systems:  Constitutional:   No  weight loss, night sweats,  Fevers, chills, fatigue, or  lassitude.  HEENT:   No headaches,  Difficulty swallowing,  Tooth/dental problems, or  Sore throat,                No sneezing, itching, ear ache, nasal congestion,+ post nasal drip,   CV:  No chest pain,  Orthopnea, PND, swelling in lower extremities, anasarca, dizziness, palpitations, syncope.   GI  No heartburn, indigestion, abdominal pain, nausea, vomiting, diarrhea, change in bowel habits, loss of appetite, bloody stools.  Resp: No shortness of breath with exertion or at rest.  No excess mucus, no productive cough,  No non-productive cough,  No coughing up of blood.  No change in color of mucus.  No wheezing.  No chest wall deformity  Skin: no rash or lesions.  GU: no dysuria, change in color of urine, no urgency or frequency.  No flank pain, no hematuria   MS:  No joint pain or swelling.  No decreased range of motion.  No back pain.  Psych:  No change in mood or affect. No depression or anxiety.  No memory loss.   Vital Signs BP 138/78 (BP Location: Right Arm, Cuff Size: Normal)   Pulse 94   Ht 6\' 2"  (1.88 m)   Wt 273 lb 9.6 oz (124.1 kg)   SpO2 97%   BMI 35.13 kg/m    Physical Exam:  General- No distress,  A&Ox3, obese ENT: No  sinus tenderness, TM clear, pale nasal mucosa, no oral exudate,no post nasal drip, no LAN Cardiac: S1, S2, regular rate and rhythm, no murmur Chest: No wheeze/ rales/ dullness; no accessory muscle use, no nasal flaring, no sternal retractions Abd.: Soft Non-tender Ext: No clubbing cyanosis, edema Neuro:  normal strength Skin: No rashes, warm and dry Psych: normal mood and behavior   Assessment/Plan  OSA (obstructive sleep apnea) Compliant with CPAP use for OSA Plan: Congratulations on your great CPAP use!! Continue on CPAP at bedtime. You appear to be benefiting from the treatment Goal is to wear for at least 4-6 hours each night for maximal clinical benefit. Continue to work on weight loss, as the link between excess weight  and sleep apnea is well established.  Do not drive if sleepy. We will place an order for a nasal mask. Follow up with Dr. Elsworth Soho  In 12  months  or before as needed. Please contact office for sooner follow up if symptoms do not improve or worsen or seek emergency care     Morbid obesity ([place]) Continue to work on weight loss as there is a direct link between weight loss and positive impact on sleep apnea.    Magdalen Spatz, NP 07/04/2016  4:02 PM    He had good results with CPAP therapy with improvement in his somnolence and fatigue. He reports good compliance and this is confirmed on his download. He has a moderate leak but really is asymptomatic with his full face mask. He is curious to switch to a nasal mask and that would be okay in my opinion. He may need a chinstrap should he develop a mouth leak. I have encouraged him to work on weight loss. He is good about taking his machine with him and he travels  Rigoberto Noel. MD

## 2016-07-04 NOTE — Assessment & Plan Note (Addendum)
Compliant with CPAP use for OSA Plan: Congratulations on your great CPAP use!! Continue on CPAP at bedtime. You appear to be benefiting from the treatment Goal is to wear for at least 4-6 hours each night for maximal clinical benefit. Continue to work on weight loss, as the link between excess weight  and sleep apnea is well established.  Do not drive if sleepy. We will place an order for a nasal mask. Follow up with Dr. Elsworth Soho  In 12  months  or before as needed. Please contact office for sooner follow up if symptoms do not improve or worsen or seek emergency care

## 2016-07-04 NOTE — Patient Instructions (Addendum)
It is nice to meet you today  Congratulations on your great CPAP use!! Continue on CPAP at bedtime. You appear to be benefiting from the treatment Goal is to wear for at least 4-6 hours each night for maximal clinical benefit. Continue to work on weight loss, as the link between excess weight  and sleep apnea is well established.  Do not drive if sleepy. We will place an order for a nasal mask. Follow up with Dr. Elsworth Soho  In 12  months  or before as needed. Please contact office for sooner follow up if symptoms do not improve or worsen or seek emergency care

## 2016-07-11 ENCOUNTER — Ambulatory Visit: Payer: Medicare Other | Admitting: Hematology

## 2016-07-14 ENCOUNTER — Other Ambulatory Visit: Payer: Self-pay | Admitting: Internal Medicine

## 2016-07-17 ENCOUNTER — Other Ambulatory Visit (HOSPITAL_BASED_OUTPATIENT_CLINIC_OR_DEPARTMENT_OTHER): Payer: Medicare Other

## 2016-07-17 DIAGNOSIS — D472 Monoclonal gammopathy: Secondary | ICD-10-CM

## 2016-07-17 LAB — CBC WITH DIFFERENTIAL/PLATELET
BASO%: 0.2 % (ref 0.0–2.0)
Basophils Absolute: 0 10*3/uL (ref 0.0–0.1)
EOS%: 2.6 % (ref 0.0–7.0)
Eosinophils Absolute: 0.2 10*3/uL (ref 0.0–0.5)
HCT: 40.7 % (ref 38.4–49.9)
HGB: 13.6 g/dL (ref 13.0–17.1)
LYMPH%: 37.2 % (ref 14.0–49.0)
MCH: 30.4 pg (ref 27.2–33.4)
MCHC: 33.4 g/dL (ref 32.0–36.0)
MCV: 91.1 fL (ref 79.3–98.0)
MONO#: 0.5 10*3/uL (ref 0.1–0.9)
MONO%: 7.1 % (ref 0.0–14.0)
NEUT#: 3.5 10*3/uL (ref 1.5–6.5)
NEUT%: 52.9 % (ref 39.0–75.0)
Platelets: 131 10*3/uL — ABNORMAL LOW (ref 140–400)
RBC: 4.47 10*6/uL (ref 4.20–5.82)
RDW: 13.8 % (ref 11.0–14.6)
WBC: 6.6 10*3/uL (ref 4.0–10.3)
lymph#: 2.5 10*3/uL (ref 0.9–3.3)

## 2016-07-17 LAB — COMPREHENSIVE METABOLIC PANEL
ALT: 18 U/L (ref 0–55)
AST: 23 U/L (ref 5–34)
Albumin: 3.6 g/dL (ref 3.5–5.0)
Alkaline Phosphatase: 60 U/L (ref 40–150)
Anion Gap: 9 mEq/L (ref 3–11)
BUN: 18.4 mg/dL (ref 7.0–26.0)
CO2: 24 mEq/L (ref 22–29)
Calcium: 9.5 mg/dL (ref 8.4–10.4)
Chloride: 108 mEq/L (ref 98–109)
Creatinine: 1.2 mg/dL (ref 0.7–1.3)
EGFR: 69 mL/min/{1.73_m2} — ABNORMAL LOW (ref >90)
Glucose: 83 mg/dl (ref 70–140)
Potassium: 4.6 mEq/L (ref 3.5–5.1)
Sodium: 141 mEq/L (ref 136–145)
Total Bilirubin: 0.58 mg/dL (ref 0.20–1.20)
Total Protein: 7.8 g/dL (ref 6.4–8.3)

## 2016-07-18 LAB — KAPPA/LAMBDA LIGHT CHAINS
Ig Kappa Free Light Chain: 13.1 mg/L (ref 3.3–19.4)
Ig Lambda Free Light Chain: 11.5 mg/L (ref 5.7–26.3)
Kappa/Lambda FluidC Ratio: 1.14 (ref 0.26–1.65)

## 2016-07-20 LAB — MULTIPLE MYELOMA PANEL, SERUM
Albumin SerPl Elph-Mcnc: 3.8 g/dL (ref 2.9–4.4)
Albumin/Glob SerPl: 1.2 (ref 0.7–1.7)
Alpha 1: 0.2 g/dL (ref 0.0–0.4)
Alpha2 Glob SerPl Elph-Mcnc: 0.9 g/dL (ref 0.4–1.0)
B-Globulin SerPl Elph-Mcnc: 1.1 g/dL (ref 0.7–1.3)
Gamma Glob SerPl Elph-Mcnc: 1.1 g/dL (ref 0.4–1.8)
Globulin, Total: 3.3 g/dL (ref 2.2–3.9)
IgA, Qn, Serum: 123 mg/dL (ref 61–437)
IgG, Qn, Serum: 1169 mg/dL (ref 700–1600)
IgM, Qn, Serum: 60 mg/dL (ref 20–172)
M Protein SerPl Elph-Mcnc: 0.3 g/dL — ABNORMAL HIGH
Total Protein: 7.1 g/dL (ref 6.0–8.5)

## 2016-07-24 ENCOUNTER — Telehealth: Payer: Self-pay | Admitting: Hematology

## 2016-07-24 ENCOUNTER — Ambulatory Visit (HOSPITAL_BASED_OUTPATIENT_CLINIC_OR_DEPARTMENT_OTHER): Payer: Medicare Other | Admitting: Hematology

## 2016-07-24 ENCOUNTER — Encounter: Payer: Self-pay | Admitting: Pulmonary Disease

## 2016-07-24 VITALS — BP 144/70 | HR 72 | Temp 98.4°F | Resp 17 | Ht 74.0 in | Wt 269.9 lb

## 2016-07-24 DIAGNOSIS — D696 Thrombocytopenia, unspecified: Secondary | ICD-10-CM | POA: Diagnosis not present

## 2016-07-24 DIAGNOSIS — D472 Monoclonal gammopathy: Secondary | ICD-10-CM

## 2016-07-24 DIAGNOSIS — M545 Low back pain: Secondary | ICD-10-CM

## 2016-07-24 DIAGNOSIS — E785 Hyperlipidemia, unspecified: Secondary | ICD-10-CM

## 2016-07-24 DIAGNOSIS — G8929 Other chronic pain: Secondary | ICD-10-CM

## 2016-07-24 DIAGNOSIS — M549 Dorsalgia, unspecified: Secondary | ICD-10-CM

## 2016-07-24 DIAGNOSIS — I10 Essential (primary) hypertension: Secondary | ICD-10-CM

## 2016-07-24 DIAGNOSIS — R5383 Other fatigue: Secondary | ICD-10-CM

## 2016-07-24 NOTE — Progress Notes (Signed)
[place]  Telephone:([phone]) [phone] Fax:(336) [phone]  Clinic follow Up Note   Patient Care Team: Marletta Lor, MD as PCP - General 07/24/2016  CHIEF COMPLAINTS:  Follow up MGUS  HISTORY OF PRESENTING ILLNESS:  Gordon Phillips 70 y.o. male is here because of abnormal SPEP and suspicious bone lesion on the CT scan.  He had a fall after he tripped and his face hit a wall on the super bowl night on 12/14/2014. He had a significant bleeding on the face and neck pain and was brought to emergency room for further evaluation by 911. CT scan of head and the neck was obtained which showed soft tissue laceration overlying the right orbit, no fracture. The skin incidentally found scattered small lucencies within the visualized osseous structures, with a 6 mm lucency noticed at the C6. He subsequently follow-up with his primary care physician, who ordered the SPEP which showed a restricted band consistent with monoclonal protein, 0.27 g/dl, the quantitative immunoglobulin IgG, IgA and IgM were within normal limits. He developed neck pain which radiate to bilateral shoulders after the accident, this was further evaluated by cervical and lumbar MRI without contrast. The MRI showed diffuse degenerative changes. The low-density lesion in the C5 vertebral body on the CT wasn't not seen in the MRI. His neck pain radiated to b/l shoulders has resolved now.   He had L4-5 disck back surgery 15 years ago in [place], and he has some chronic back pain since then. It has been getting worse in the past few years. He takes Percocet as needed for the back pain. He otherwise feels well, denies any other pain. He works full-time, and remains to be physically active. He denies any fever, chills, or night sweats. No recent weight loss.   CURRENT TREATMENT: Observation  Interim history: Gordon Phillips returns for follow-up. He is accompanied by his wife to the clinic today. His doing well overall. His main  complaint is still his chronic back pain, especially after long hour standing. He had prior lumbar spine surgery, has been followed by her orthopedic surgeon, he does not want to have more surgery. He recently finished a course of steroids, which helped some. He otherwise feels well, denies any other new pain, or other symptoms. He is able to function well.  MEDICAL HISTORY:  Past Medical History:  Diagnosis Date  . ALLERGIC RHINITIS 01/25/2010  . Hx of cardiovascular stress test    a. Lexiscan Myoview 02/06/13:  EF 59%, no ischemia or scar  . Hx of echocardiogram    a.  Echo 02/03/13:  Mild LVH, mild focal basal septal hypertrophy, EF 50-55%, Gr 1 DD, mild LAE  . HYPERTENSION 01/25/2010  . HYPERTHYROIDISM 01/25/2010  . Impaired glucose tolerance   . Migraines   . Obesity   . OSA (obstructive sleep apnea)    Uses CPAP nightly  . Pericarditis 1980's  . 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  Marland Kitchen Vitiligo     SURGICAL HISTORY: Past Surgical History:  Procedure Laterality Date  . HERNIA REPAIR     umbilical  . INGUINAL HERNIA REPAIR    . LUMBAR LAMINECTOMY      SOCIAL HISTORY: Social History   Social History  . Marital status: Married    Spouse name: N/A  . Number of children: 2  . Years of education: N/A   Occupational History  . sales    Social History Main Topics  . Smoking  status: Former Smoker    Years: 8.00    Quit date: 11/06/1966  . Smokeless tobacco: Not on file  . Alcohol use Yes     Comment: occasional - max 2x/week  . Drug use: No  . Sexual activity: Not on file   Other Topics Concern  . Not on file   Social History Narrative  . No narrative on file    FAMILY HISTORY: Family History  Problem Relation Age of Onset  . Thyroid disease Sister     goiter  . Stroke Father     Passed away in his 78s  . Sudden death Brother     ? Drug use, no autopsy  . Throat cancer Brother     ALLERGIES:  is allergic to flomax [tamsulosin hcl] and  relpax [eletriptan].  MEDICATIONS:  Current Outpatient Prescriptions  Medication Sig Dispense Refill  . aspirin EC 81 MG tablet Take 1 tablet (81 mg total) by mouth daily.    Marland Kitchen doxazosin (CARDURA) 2 MG tablet Take 1 tablet (2 mg total) by mouth at bedtime. 90 tablet 3  . fluticasone (FLONASE) 50 MCG/ACT nasal spray Place 2 sprays into both nostrils as needed. 16 g 2  . losartan (COZAAR) 100 MG tablet TAKE 1 TABLET DAILY 90 tablet 3  . metoprolol succinate (TOPROL-XL) 100 MG 24 hr tablet TAKE 1 TABLET DAILY 90 tablet 3  . pravastatin (PRAVACHOL) 40 MG tablet TAKE 1 TABLET DAILY 90 tablet 3  . predniSONE (STERAPRED UNI-PAK 21 TAB) 5 MG (21) TBPK tablet See admin instructions.  0  . sildenafil (VIAGRA) 100 MG tablet Take 100 mg by mouth daily as needed for erectile dysfunction (erectile dysfunction).     No current facility-administered medications for this visit.     REVIEW OF SYSTEMS:   Constitutional: Denies fevers, chills or abnormal night sweats Eyes: Denies blurriness of vision, double vision or watery eyes Ears, nose, mouth, throat, and face: Denies mucositis or sore throat Respiratory: Denies cough, dyspnea or wheezes Cardiovascular: Denies palpitation, chest discomfort or lower extremity swelling Gastrointestinal:  Denies nausea, heartburn or change in bowel habits Skin: Denies abnormal skin rashes Lymphatics: Denies new lymphadenopathy or easy bruising Neurological:Denies numbness, tingling or new weaknesses Behavioral/Psych: Mood is stable, no new changes  All other systems were reviewed with the patient and are negative.  PHYSICAL EXAMINATION: ECOG PERFORMANCE STATUS: 1 - Symptomatic but completely ambulatory  Vitals:   07/24/16 1332  BP: (!) 144/70  Pulse: 72  Resp: 17  Temp: 98.4 F (36.9 C)   Filed Weights   07/24/16 1332  Weight: 269 lb 14.4 oz (122.4 kg)    GENERAL:alert, no distress and comfortable SKIN: skin color, texture, turgor are normal, no rashes  or significant lesions (+) vitiligo EYES: normal, conjunctiva are pink and non-injected, sclera clear OROPHARYNX:no exudate, no erythema and lips, buccal mucosa, and tongue normal  NECK: supple, thyroid normal size, non-tender, without nodularity LYMPH:  no palpable lymphadenopathy in the cervical, axillary or inguinal LUNGS: clear to auscultation and percussion with normal breathing effort HEART: regular rate & rhythm and no murmurs and no lower extremity edema ABDOMEN:abdomen soft, non-tender and normal bowel sounds Musculoskeletal:no cyanosis of digits and no clubbing  PSYCH: alert & oriented x 3 with fluent speech NEURO: no focal motor/sensory deficits  LABORATORY DATA:  I have reviewed the data as listed CBC Latest Ref Rng & Units 07/17/2016 02/28/2016 11/22/2015  WBC 4.0 - 10.3 10e3/uL 6.6 5.4 4.8  Hemoglobin 13.0 - 17.1 g/dL  13.6 13.1 13.4  Hematocrit 38.4 - 49.9 % 40.7 40.0 40.9  Platelets 140 - 400 10e3/uL 131(L) 129(L) 130(L)    CMP Latest Ref Rng & Units 07/17/2016 07/17/2016 02/28/2016  Glucose 70 - 140 mg/dl 83 - 109  BUN 7.0 - 26.0 mg/dL 18.4 - 16.5  Creatinine 0.7 - 1.3 mg/dL 1.2 - 1.0  Sodium 136 - 145 mEq/L 141 - 140  Potassium 3.5 - 5.1 mEq/L 4.6 - 3.9  Chloride 96 - 112 mEq/L - - -  CO2 22 - 29 mEq/L 24 - 24  Calcium 8.4 - 10.4 mg/dL 9.5 - 9.0  Total Protein 6.4 - 8.3 g/dL 7.8 7.1 7.1  Total Bilirubin 0.20 - 1.20 mg/dL 0.58 - 0.64  Alkaline Phos 40 - 150 U/L 60 - 51  AST 5 - 34 U/L 23 - 22  ALT 0 - 55 U/L 18 - 19   SPEP/IFE M-protein  05/24/2015: 0.3 07/17/2016: 0.3  Kappa, lamda light chain and ration 07/17/2016: 13.1, 11.5, 1.14   PATHOLOGY REPORT  Bone Marrow, Aspirate,Biopsy, and Clot, iliac crest 02/08/2015 - HYPERCELLULAR BONE MARROW FOR AGE WITH TRILINEAGE HEMATOPOIESIS INCLUDING ABUNDANT MEGAKARYOCYTES. - 5% PLASMA CELLS. - SEE COMMENT. PERIPHERAL BLOOD: - NO SIGNIFICANT MORPHOLOGIC ABNORMALITIES. Diagnosis Note The bone marrow is hypercellular for  age with trilineage hematopoiesis including abundant megakaryocytes, some of which display atypical features. The significance of latter finding is uncertain at this time and hence correlation with cytogenetic studies is recommended. The plasma cells represent 5% of all cells in the aspirate associated with kappa light chain excess. Given the relatively small number of plasma cells evaluated, the overall features are considered suggestive but not entirely definitive of plasma cell dyscrasia. Clinical and cytogenetic correlation is recommended. (BNS:ecj 02/10/2015)    RADIOGRAPHIC STUDIES: I have personally reviewed the radiological images as listed and agreed with the findings in the report. Mr Cervical Spine Wo Contrast 01/14/2015    IMPRESSION: Negative for cervical spine fracture  Moderate cervical disc degeneration and spondylosis.  C5 vertebral lesion on the left on CT is difficult to visualize on MRI however no worrisome lesion is seen in this area.   Electronically Signed   By: Franchot Gallo M.D.   On: 01/14/2015 08:04   Mr Lumbar Spine Wo Contrast 01/14/2015    IMPRESSION: Extra foraminal disc contacts the exited left L2 root.  Moderate to moderately severe bilateral foraminal narrowing, worse on the right at L3-4 is due to disc with endplate spurring and facet degenerative disease.  Advanced facet arthropathy at L5-S1 results in 0.4 cm anterolisthesis. There is left much worse than right lateral recess narrowing at this level and moderate to moderately severe bilateral foraminal narrowing.  Moderate to moderately severe bilateral foraminal narrowing at L4-5 appears worse on the right. The central canal and right lateral recess are also mildly narrowed at this level.   Electronically Signed   By: Inge Rise M.D.   On: 01/14/2015 10:02    Bone survey 02/08/2015 IMPRESSION: Subtle lucencies are noted in the skull, cervical spine, and left femoral greater trochanter. Very subtle changes  of myeloma cannot be completely excluded.   ASSESSMENT & PLAN:  69 year old African-American male, with past medical history of Graves' disease, vitiligo, hypertension, dyslipidemia, was incidentally found a small hypodense lesion in C5 on CT scan. Lab work reviewed a monoclonal paraprotein.  1. MGUS -His SPEP reveal a low-level of monoclonal paraprotein (0.27g/dl), immunoglobulin level were normal, he does not have anemia, renal dysfunction, hypercalcemia on  the lab test.  -His CT scan revealed a small hyper lytic lesion in the C5 vertebral body however was not obvious on the MRI scan. -A bone survey showed subtle lucencies in multiple bones, indeterminate.  -His bone marrow biopsy showed 5% plasma cells, suggestive of plasma cell dyscrasia. Cytogenetics was normal. -We discussed the natural history of MGUS, and small possibility (1% every year) of developing multiple myeloma. I recommend routine follow-up  -His repeated lab has showed stable M protein, and normal CBC CMP. -I think his back pain and fatigue unlikely related to his MGUS, lumbar spine in March 2016 showed stenosis, no lytic lesions. -We'll continue monitoring. Given these very stable M protein level, I'll see him with Every 6 months  3. Mild thrombocytopenia -The platelet count has been in the range of 130 to 153 in the past 2 years, stable overall. -He does have other autoimmune disease including Graves' disease and vitiligo.  - His bone marrow biopsy showed abundant megakaryocytes, some of which display atypical features, the significance is uncertain. -We'll continue follow-up his CBC. Platelet count 131K today   4. Chronic back pain -His lumbar MRI in March 2016 showed severe degenerative changes, which is likely the cause of his back pain. -I strongly encouraged him to follow-up with his sports medicine Dr. Mina Marble   5. Fatigue -possible relate to his back pain  -I encouraged him to follow-up with his primary care  physician. His TSH was normal.  6. Hypertension, dyslipidemia -He'll continue follow-up with his primary care physician  Plan -Return to clinic in 6 months with lab one week before   All questions were answered. The patient knows to call the clinic with any problems, questions or concerns. I spent 15 minutes counseling the patient face to face. The total time spent in the appointment was 20 minutes and more than 50% was on counseling.     Truitt Merle, MD 07/24/2016

## 2016-07-24 NOTE — Telephone Encounter (Signed)
Avs report and appointment schedule given to patient, per 07/24/16 los. °

## 2016-07-25 ENCOUNTER — Encounter: Payer: Self-pay | Admitting: Hematology

## 2016-09-11 ENCOUNTER — Encounter: Payer: Medicare Other | Admitting: Internal Medicine

## 2016-09-29 IMAGING — MR MR CERVICAL SPINE W/O CM
5 series · 28 of 48 positions shown · non-contrast
Comparison: CT cervical spine 12/14/2014

CLINICAL DATA: Fall 12/14/2014.  Neck pain.  Follow-up lesion at C6

EXAM:
MRI CERVICAL SPINE WITHOUT CONTRAST
TECHNIQUE: Multiplanar, multisequence MR imaging of the cervical spine was
performed. No intravenous contrast was administered.

[Series 3: T2 · sagittal · 3.0mm · 0.66mm/px · 6 of 12 slices shown (1 of 2)]
[im 1/12]
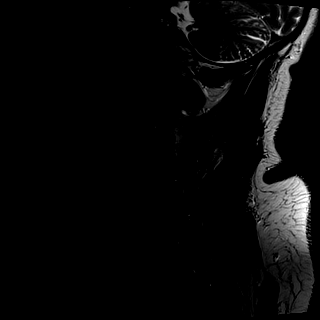
[im 3/12]
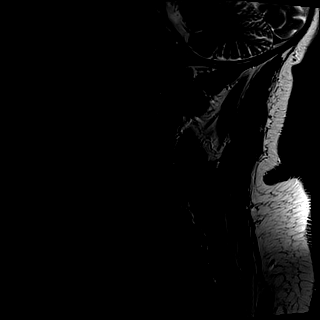
[im 5/12]
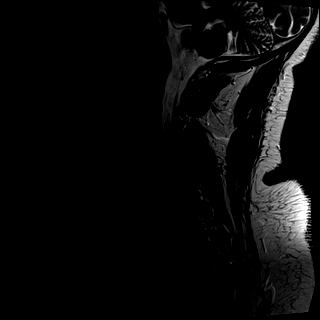
[im 7/12]
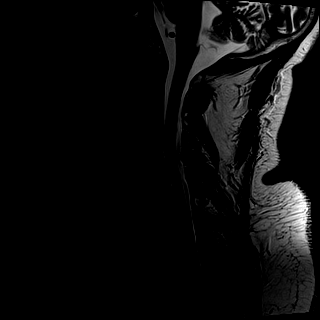
[im 9/12]
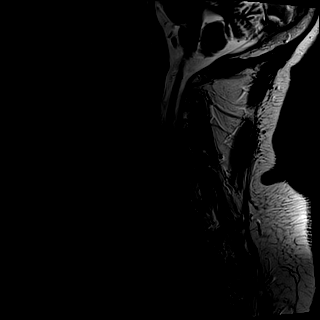
[im 12/12]
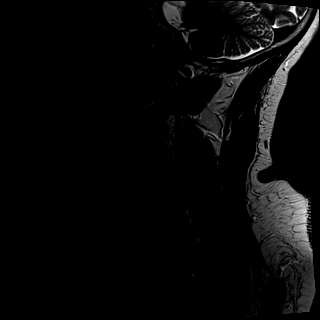

[Series 4: T1 · sagittal · 3.0mm · 0.41mm/px · 6 of 12 slices shown]
[im 1/12]
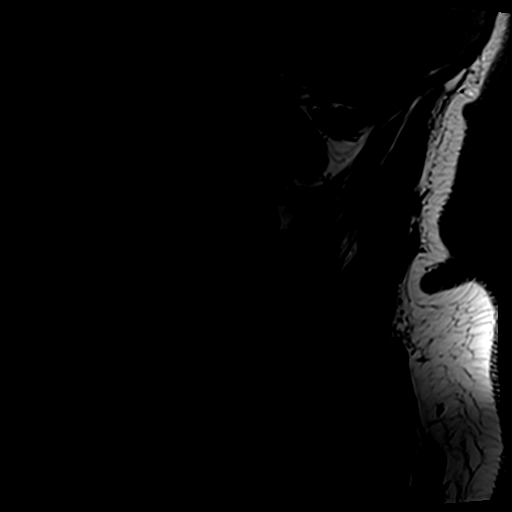
[im 3/12]
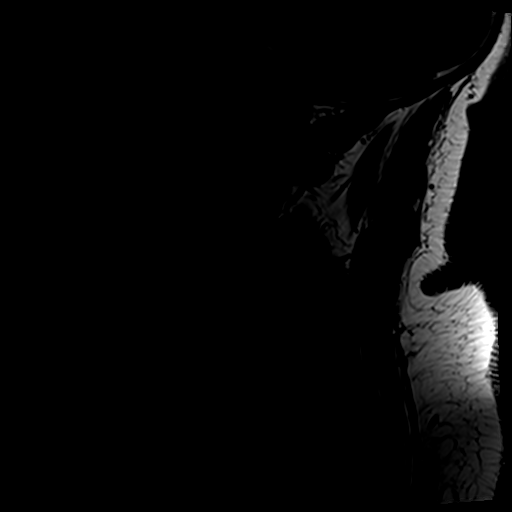
[im 5/12]
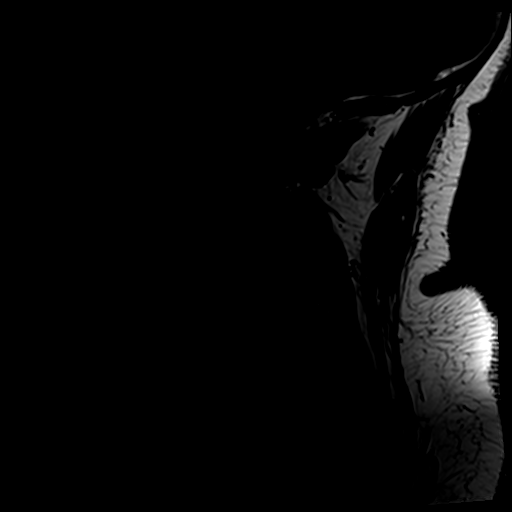
[im 7/12]
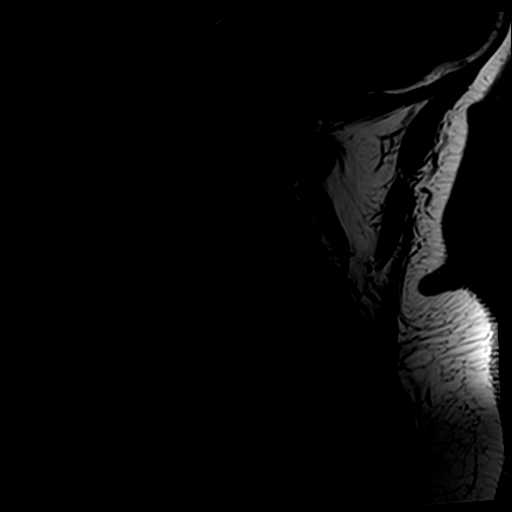
[im 9/12]
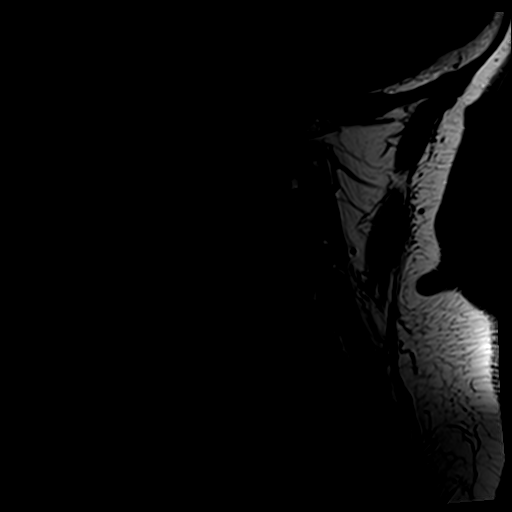
[im 12/12]
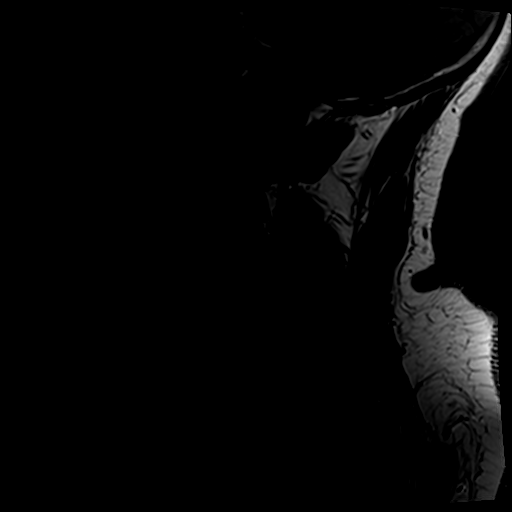

[Series 5: tir sag · sagittal · 3.0mm · 0.41mm/px · 6 of 12 slices shown]
[im 1/12]
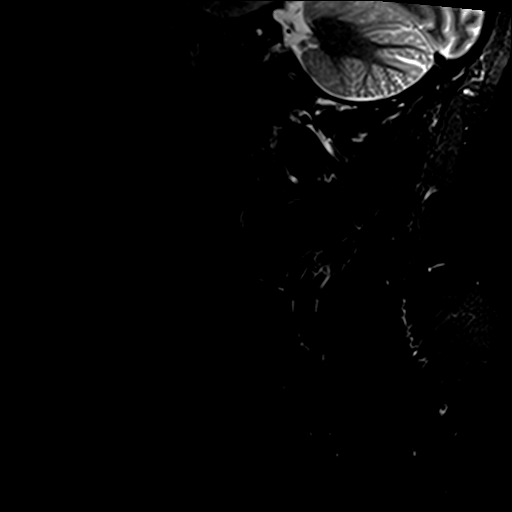
[im 3/12]
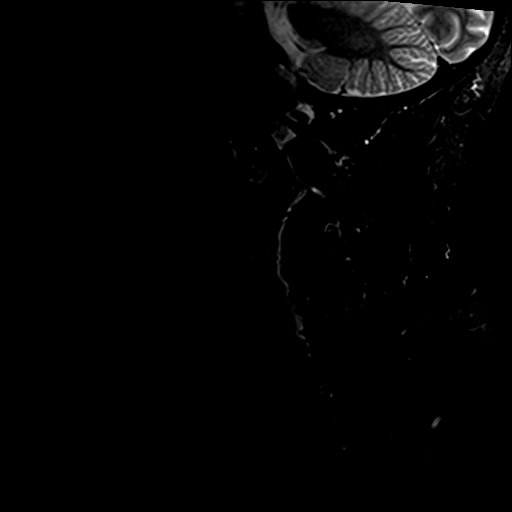
[im 5/12]
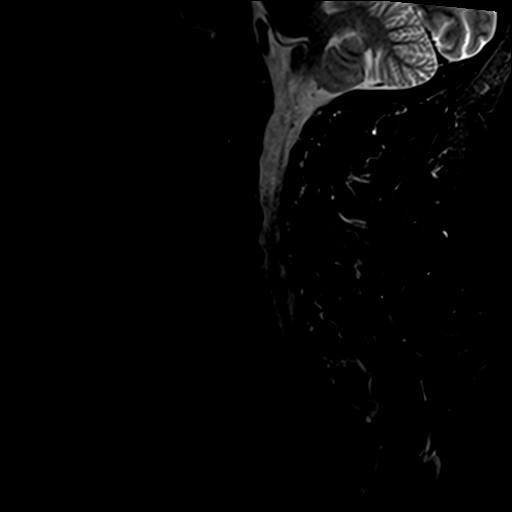
[im 7/12]
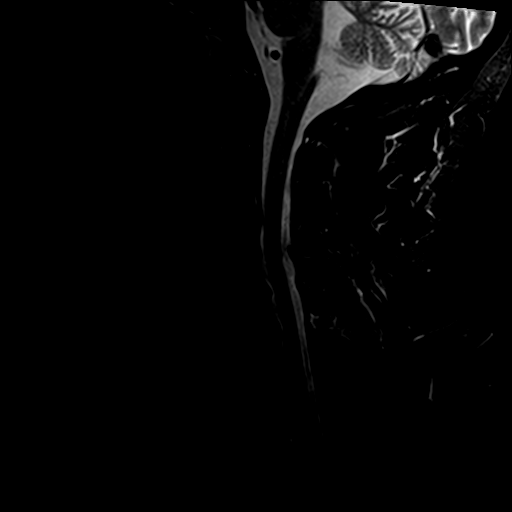
[im 9/12]
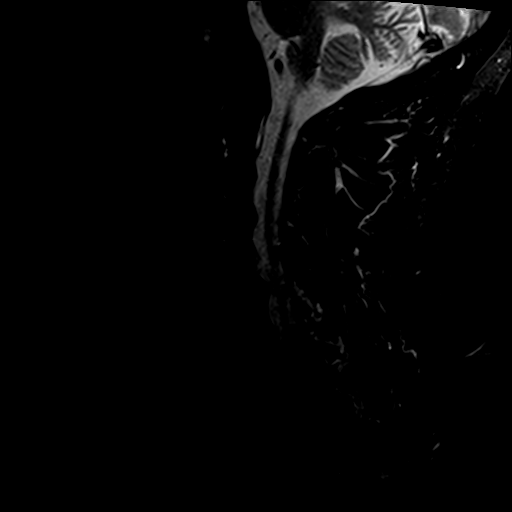
[im 12/12]
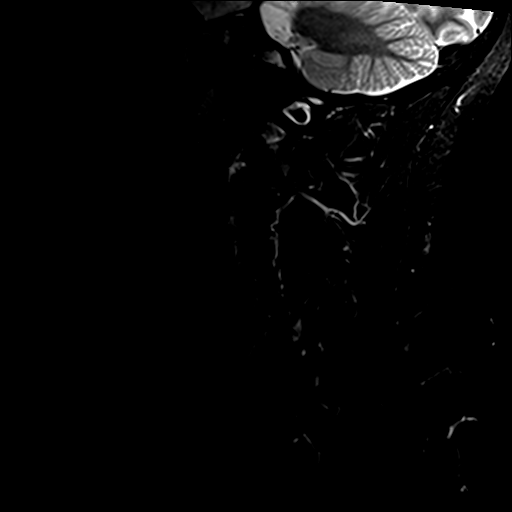

[Series 6: GRE · axial · 3.0mm · 0.35mm/px · 1 of 28 slices shown]
[im 1/28]
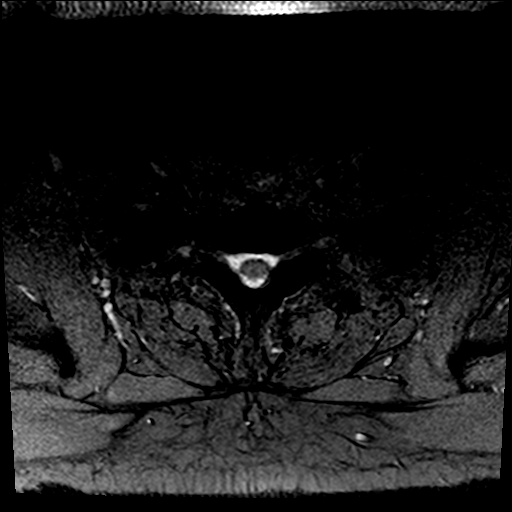

[Series 7: T2 · axial · 3.0mm · 0.70mm/px · z∈[-46,+54]mm · 9 of 28 slices shown (2 of 2)]
[im 1/28]
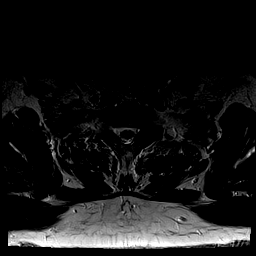
[im 4/28]
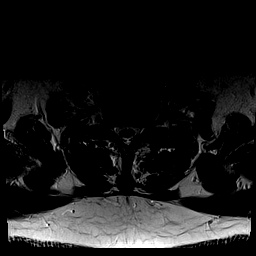
[im 8/28]
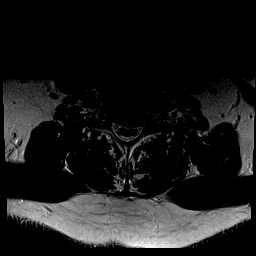
[im 12/28]
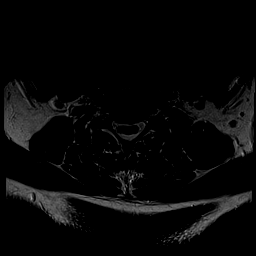
[im 14/28]
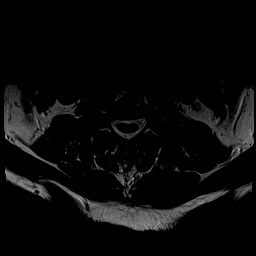
[im 16/28]
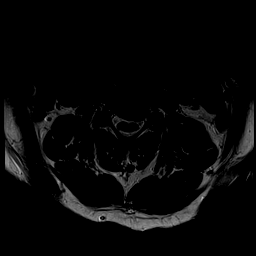
[im 20/28]
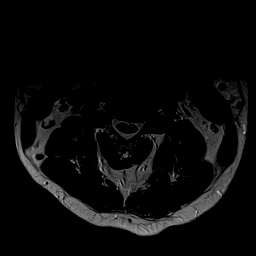
[im 24/28]
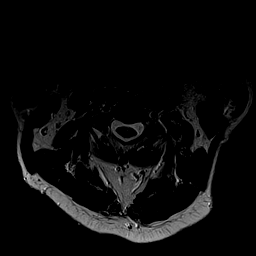
[im 28/28]
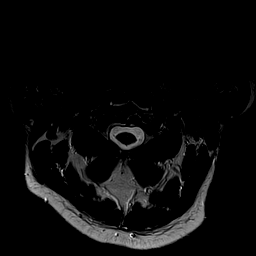

[28 of 48 positions shown; findings below may reference images not displayed]

FINDINGS: Normal cervical alignment with straightening of the cervical
lordosis. Negative for fracture. Spinal cord signal is normal.
Craniocervical junction is normal.

Review of the recent CT reveals a 6 mm low-density lesion in the C5
vertebral body on the left. This is difficult to see on MRI but
there is no evidence of hemangioma or mass lesion in the area. No
significant fluid-filled cyst is present. This may represent
heterogeneous bone marrow related to degenerative change on the CT.

C2-3:  Mild degenerative change without stenosis

C3-4: Small central disc protrusion. Diffuse uncinate spurring left
greater than right with left foraminal encroachment. No cord
deformity.

C4-5: Disc degeneration and spondylosis. Small left-sided disc
protrusion without cord deformity. Mild facet hypertrophy
bilaterally. Mild foraminal narrowing bilaterally

C5-6: Moderate disc degeneration and spondylosis. Mild spinal and
foraminal stenosis bilaterally.

C6-7: Disc degeneration and spondylosis. Mild spinal stenosis and
mild foraminal stenosis bilaterally.

C7-T1: Moderate facet hypertrophy bilaterally. Mild disc
degeneration. Mild spinal stenosis. Moderate foraminal encroachment
bilaterally due to facet hypertrophy.
IMPRESSION: Negative for cervical spine fracture

Moderate cervical disc degeneration and spondylosis.

C5 vertebral lesion on the left on CT is difficult to visualize on
MRI however no worrisome lesion is seen in this area.

## 2016-09-29 IMAGING — MR MR LUMBAR SPINE W/O CM
5 of 6 series · 24 of 48 positions shown · non-contrast
Comparison: None.

CLINICAL DATA: Status post fall 12/14/2014. Low back pain radiating
into both legs.

EXAM:
MRI LUMBAR SPINE WITHOUT CONTRAST
TECHNIQUE: Multiplanar, multisequence MR imaging of the lumbar spine was
performed. No intravenous contrast was administered.

[Series 3: T2 · sagittal · 4.0mm · 0.55mm/px · 5 of 12 slices shown (1 of 2)]
[im 1/12]
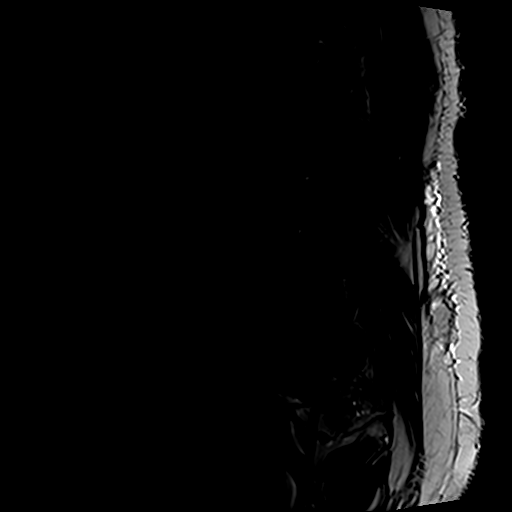
[im 3/12]
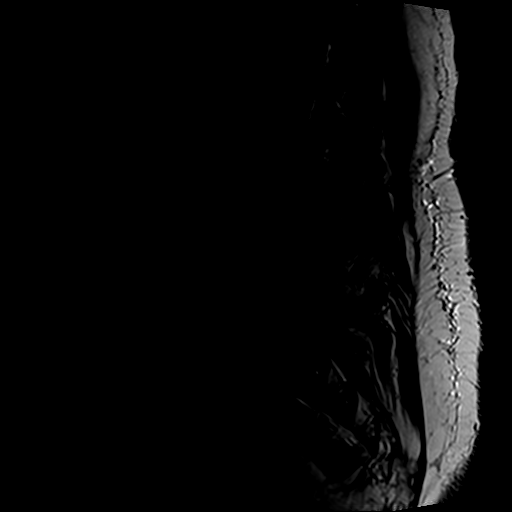
[im 6/12]
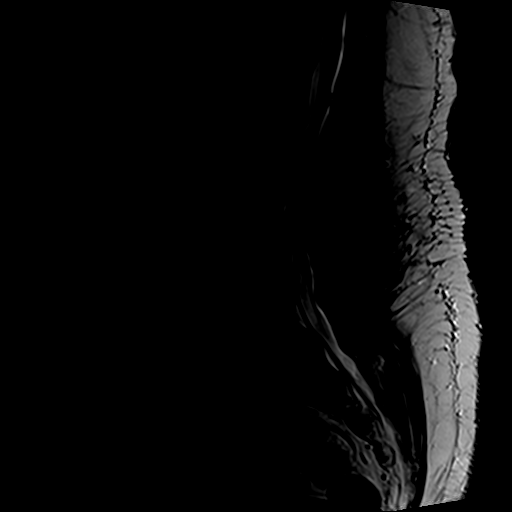
[im 9/12]
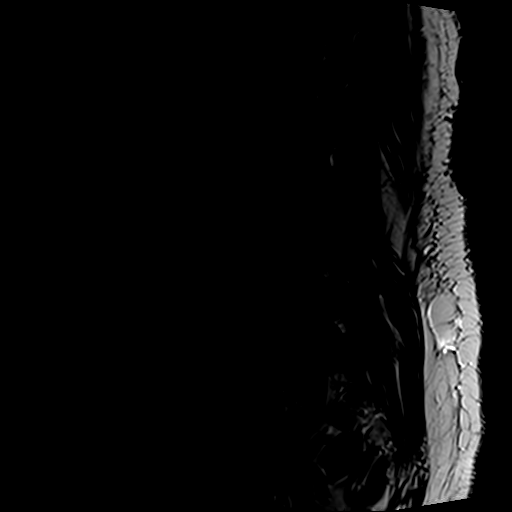
[im 12/12]
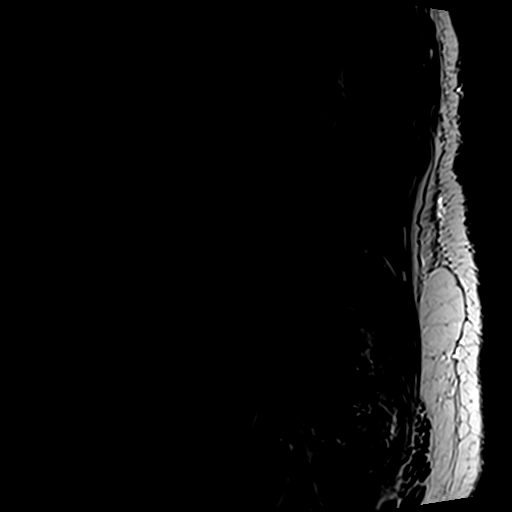

[Series 4: T1 · sagittal · 4.0mm · 0.55mm/px · 5 of 12 slices shown (1 of 3)]
[im 1/12]
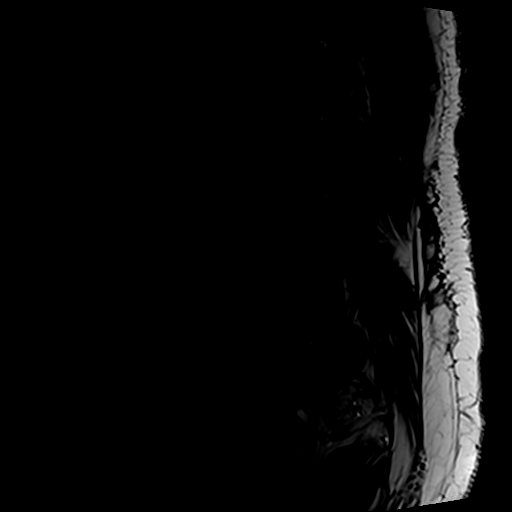
[im 3/12]
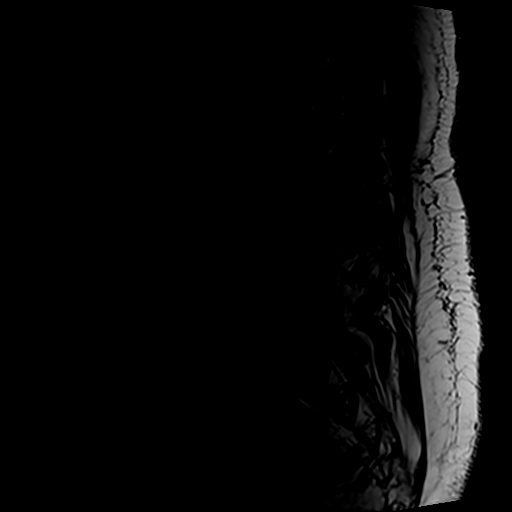
[im 6/12]
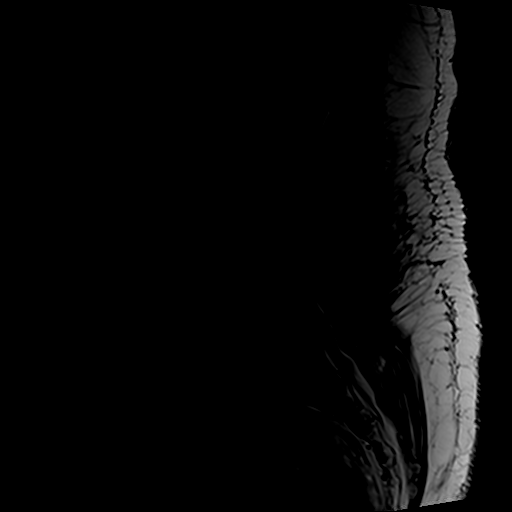
[im 9/12]
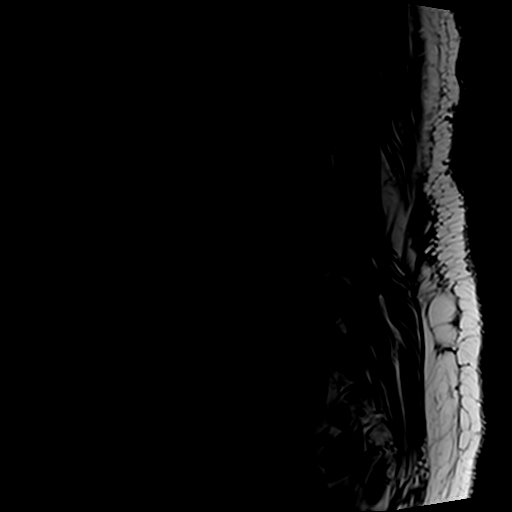
[im 12/12]
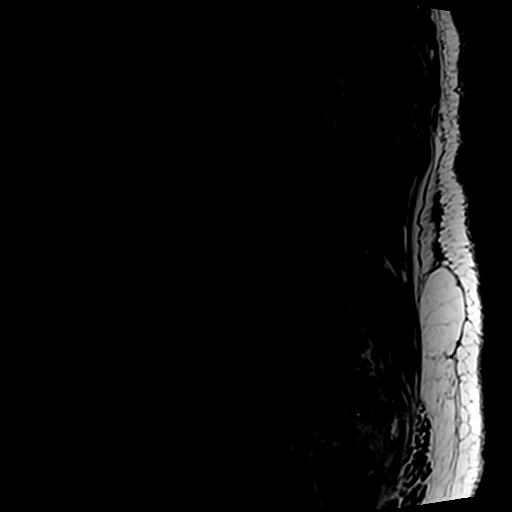

[Series 6: T2 · axial · 4.0mm · 0.70mm/px · z∈[-99,+86]mm · 8 of 36 slices shown (2 of 2)]
[im 1/36]
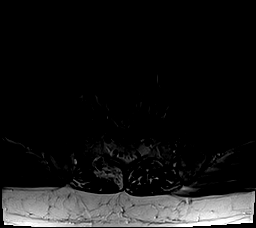
[im 6/36]
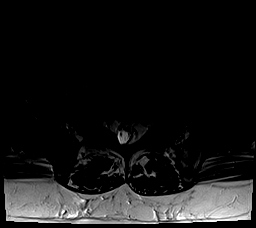
[im 11/36]
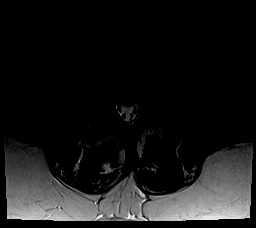
[im 17/36]
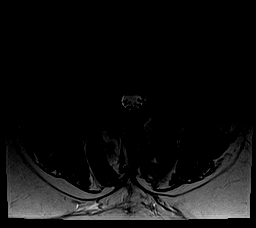
[im 19/36]
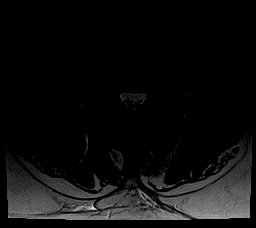
[im 25/36]
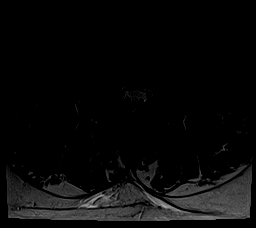
[im 30/36]
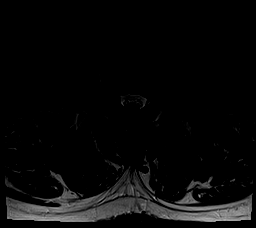
[im 36/36]
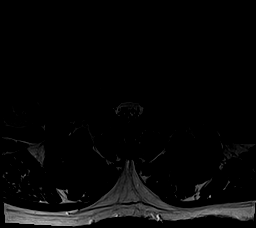

[Series 7: T1 · axial · 4.0mm · 0.35mm/px · z∈[-99,+76]mm · 5 of 12 slices shown (2 of 3)]
[im 1/12]
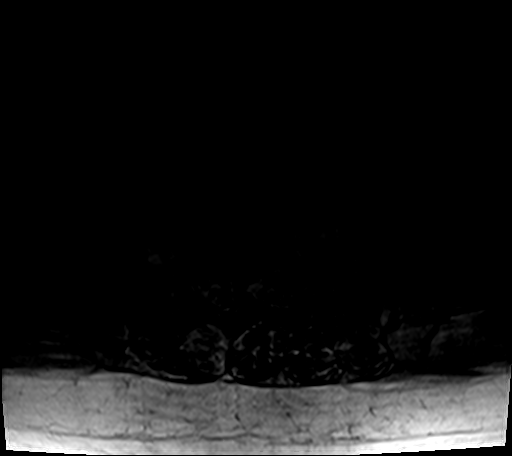
[im 3/12]
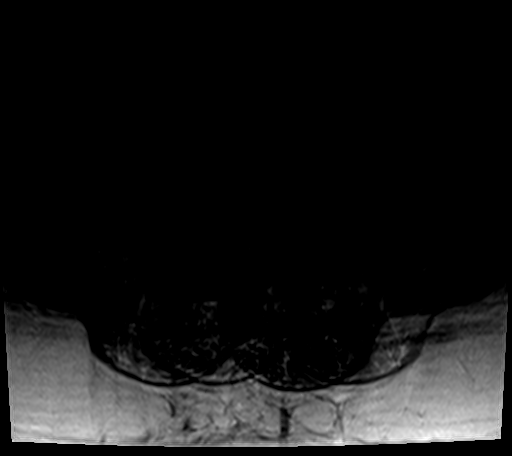
[im 6/12]
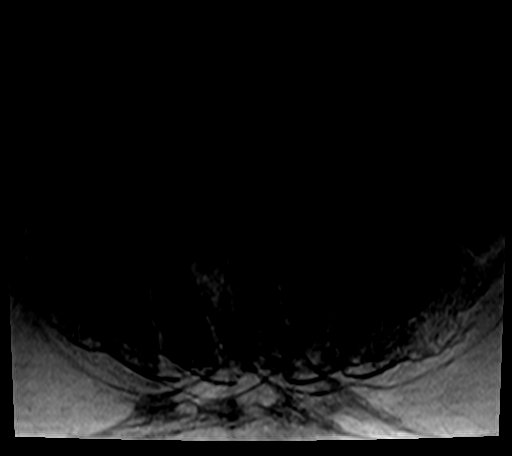
[im 9/12]
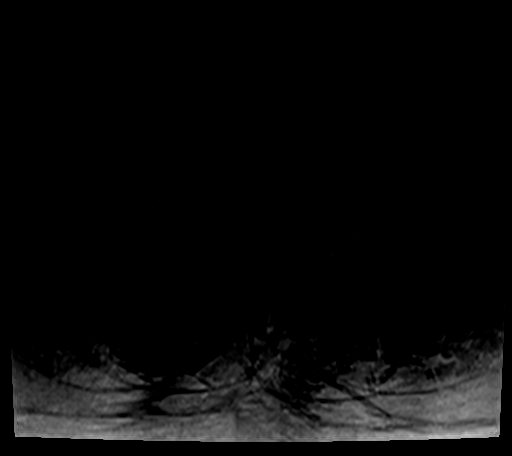
[im 12/12]
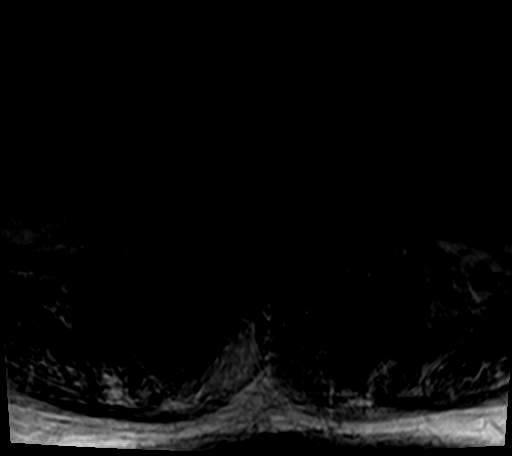

[Series 8: T1 · axial · 4.0mm · 0.35mm/px · 1 of 36 slices shown (3 of 3)]
[im 1/36]
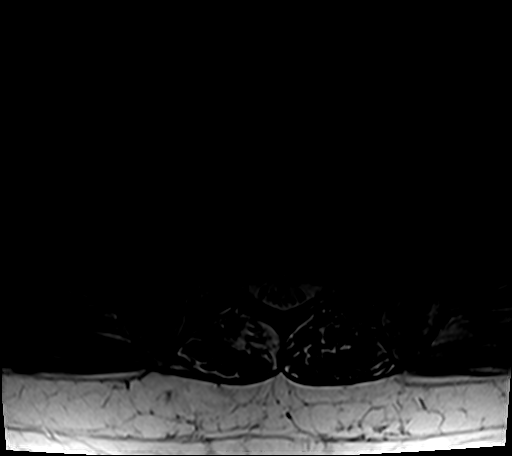

[24 of 48 positions shown; findings below may reference images not displayed]

FINDINGS: No fracture is identified. Marrow signal is heterogeneous without
worrisome marrow lesion seen. 0.4 cm anterolisthesis L5 on S1 is due
to facet arthropathy. The conus medullaris is normal in signal and
position. Imaged intra-abdominal contents are unremarkable.

The T12-L1 level is imaged in the sagittal plane only. The central
canal and foramina appear open with mild disc bulging noted.

L1-2: There is a shallow disc bulge and very small down turning
right paracentral protrusion. A very small left lateral recess
protrusion is also identified. The central canal is only mildly
narrowed. The foramina appear open.

L2-3: There is shallow disc bulge and extra foraminal protrusion on
the left. The central canal and foramina appear open. Disc contacts
the exited left L2 root.

L3-4: There is a shallow disc bulge with endplate spurring. Facet
arthropathy is noted. The central canal is open. Moderate to
moderately severe foraminal narrowing is worse on the right.

L4-5: Right worse than left facet degenerative disease is
identified. A shallow disc bulge is eccentrically prominent to the
right. The central canal and right lateral recess are mildly
narrowed. Moderate to moderately severe foraminal narrowing appears
worse on the right.

L5-S1: Advanced bilateral facet degenerative change is present. The
disc is uncovered with a bulge. Mild to moderate central canal
stenosis is present. There is left much worse than right lateral
recess narrowing. The descending left S1 root appears compressed.
Moderate to moderately severe bilateral foraminal narrowing is
identified.
IMPRESSION: Extra foraminal disc contacts the exited left L2 root.

Moderate to moderately severe bilateral foraminal narrowing, worse
on the right at L3-4 is due to disc with endplate spurring and facet
degenerative disease.

Advanced facet arthropathy at L5-S1 results in 0.4 cm
anterolisthesis. There is left much worse than right lateral recess
narrowing at this level and moderate to moderately severe bilateral
foraminal narrowing.

Moderate to moderately severe bilateral foraminal narrowing at L4-5
appears worse on the right. The central canal and right lateral
recess are also mildly narrowed at this level.

## 2016-11-06 DIAGNOSIS — G4733 Obstructive sleep apnea (adult) (pediatric): Secondary | ICD-10-CM | POA: Diagnosis not present

## 2016-11-29 ENCOUNTER — Telehealth: Payer: Self-pay

## 2016-11-29 NOTE — Telephone Encounter (Signed)
Spoke with pt and he states that he was at [facility] last night when he slipped on something in the floor and fell and hit his head on the wall. He reports dizziness and some disorientation, but denies any laceration or LOC. He was able to drive himself home. EMS was not called. He reports today there is numbness in the area of impact. Advised pt that he needs to be seen immediately for evaluation at ED. Pt refuses. Advised that he at least needs to go to [facility] as he is refusing ED. Pt agreed. Recommended [facility] in case he will need transfer to ED. Pt is going now. Nothing further needed.   Dr. Raliegh Ip - [facility] Rainier. Thanks!

## 2016-11-30 NOTE — Telephone Encounter (Signed)
LMTCB to check on pt.

## 2017-01-15 ENCOUNTER — Other Ambulatory Visit: Payer: Medicare Other

## 2017-01-16 ENCOUNTER — Other Ambulatory Visit (HOSPITAL_BASED_OUTPATIENT_CLINIC_OR_DEPARTMENT_OTHER): Payer: Medicare Other

## 2017-01-16 DIAGNOSIS — D472 Monoclonal gammopathy: Secondary | ICD-10-CM

## 2017-01-16 LAB — COMPREHENSIVE METABOLIC PANEL
ALT: 12 U/L (ref 0–55)
AST: 18 U/L (ref 5–34)
Albumin: 3.9 g/dL (ref 3.5–5.0)
Alkaline Phosphatase: 65 U/L (ref 40–150)
Anion Gap: 6 mEq/L (ref 3–11)
BUN: 13.6 mg/dL (ref 7.0–26.0)
CO2: 29 mEq/L (ref 22–29)
Calcium: 9.7 mg/dL (ref 8.4–10.4)
Chloride: 106 mEq/L (ref 98–109)
Creatinine: 1.2 mg/dL (ref 0.7–1.3)
EGFR: 74 mL/min/{1.73_m2} — ABNORMAL LOW (ref >90)
Glucose: 119 mg/dl (ref 70–140)
Potassium: 4.3 mEq/L (ref 3.5–5.1)
Sodium: 141 mEq/L (ref 136–145)
Total Bilirubin: 0.91 mg/dL (ref 0.20–1.20)
Total Protein: 7.5 g/dL (ref 6.4–8.3)

## 2017-01-16 LAB — CBC WITH DIFFERENTIAL/PLATELET
BASO%: 0.5 % (ref 0.0–2.0)
Basophils Absolute: 0 10*3/uL (ref 0.0–0.1)
EOS%: 3.2 % (ref 0.0–7.0)
Eosinophils Absolute: 0.2 10*3/uL (ref 0.0–0.5)
HCT: 41.4 % (ref 38.4–49.9)
HGB: 13.6 g/dL (ref 13.0–17.1)
LYMPH%: 33.7 % (ref 14.0–49.0)
MCH: 30 pg (ref 27.2–33.4)
MCHC: 32.8 g/dL (ref 32.0–36.0)
MCV: 91.4 fL (ref 79.3–98.0)
MONO#: 0.3 10*3/uL (ref 0.1–0.9)
MONO%: 6.3 % (ref 0.0–14.0)
NEUT#: 2.7 10*3/uL (ref 1.5–6.5)
NEUT%: 56.3 % (ref 39.0–75.0)
Platelets: 128 10*3/uL — ABNORMAL LOW (ref 140–400)
RBC: 4.53 10*6/uL (ref 4.20–5.82)
RDW: 13.8 % (ref 11.0–14.6)
WBC: 4.7 10*3/uL (ref 4.0–10.3)
lymph#: 1.6 10*3/uL (ref 0.9–3.3)

## 2017-01-17 LAB — KAPPA/LAMBDA LIGHT CHAINS
Ig Kappa Free Light Chain: 12.1 mg/L (ref 3.3–19.4)
Ig Lambda Free Light Chain: 9.8 mg/L (ref 5.7–26.3)
Kappa/Lambda FluidC Ratio: 1.23 (ref 0.26–1.65)

## 2017-01-19 LAB — MULTIPLE MYELOMA PANEL, SERUM
Albumin SerPl Elph-Mcnc: 3.7 g/dL (ref 2.9–4.4)
Albumin/Glob SerPl: 1.2 (ref 0.7–1.7)
Alpha 1: 0.2 g/dL (ref 0.0–0.4)
Alpha2 Glob SerPl Elph-Mcnc: 0.9 g/dL (ref 0.4–1.0)
B-Globulin SerPl Elph-Mcnc: 0.9 g/dL (ref 0.7–1.3)
Gamma Glob SerPl Elph-Mcnc: 1.2 g/dL (ref 0.4–1.8)
Globulin, Total: 3.2 g/dL (ref 2.2–3.9)
IgA, Qn, Serum: 126 mg/dL (ref 61–437)
IgG, Qn, Serum: 1179 mg/dL (ref 700–1600)
IgM, Qn, Serum: 64 mg/dL (ref 20–172)
M Protein SerPl Elph-Mcnc: 0.4 g/dL — ABNORMAL HIGH
Total Protein: 6.9 g/dL (ref 6.0–8.5)

## 2017-01-22 ENCOUNTER — Ambulatory Visit (HOSPITAL_BASED_OUTPATIENT_CLINIC_OR_DEPARTMENT_OTHER): Payer: Medicare Other | Admitting: Hematology

## 2017-01-22 ENCOUNTER — Telehealth: Payer: Self-pay | Admitting: Hematology

## 2017-01-22 ENCOUNTER — Telehealth: Payer: Self-pay

## 2017-01-22 VITALS — BP 169/89 | HR 83 | Temp 98.9°F | Resp 19 | Ht 74.0 in | Wt 267.0 lb

## 2017-01-22 DIAGNOSIS — D472 Monoclonal gammopathy: Secondary | ICD-10-CM

## 2017-01-22 DIAGNOSIS — R5383 Other fatigue: Secondary | ICD-10-CM | POA: Diagnosis not present

## 2017-01-22 DIAGNOSIS — M545 Low back pain: Secondary | ICD-10-CM | POA: Diagnosis not present

## 2017-01-22 DIAGNOSIS — D696 Thrombocytopenia, unspecified: Secondary | ICD-10-CM | POA: Diagnosis not present

## 2017-01-22 DIAGNOSIS — I10 Essential (primary) hypertension: Secondary | ICD-10-CM | POA: Diagnosis not present

## 2017-01-22 DIAGNOSIS — G8929 Other chronic pain: Secondary | ICD-10-CM | POA: Diagnosis not present

## 2017-01-22 NOTE — Progress Notes (Signed)
Gordon Phillips  [phone] [phone]  Clinic follow Up Note   Patient Care Team: Marletta Lor, MD as PCP - General 01/22/2017  CHIEF COMPLAINTS:  Follow up MGUS  HISTORY OF PRESENTING ILLNESS:  Gordon Phillips 71 y.o. male is here because of abnormal SPEP and suspicious bone lesion on the CT scan.  He had a fall after he tripped and his face hit a wall on the super bowl night on 12/14/2014. He had a significant bleeding on the face and neck pain and was brought to emergency room for further evaluation by 911. CT scan of head and the neck was obtained which showed soft tissue laceration overlying the right orbit, no fracture. The skin incidentally found scattered small lucencies within the visualized osseous structures, with a 6 mm lucency noticed at the C6. He subsequently follow-up with his primary care physician, who ordered the SPEP which showed a restricted band consistent with monoclonal protein, 0.27 g/dl, the quantitative immunoglobulin IgG, IgA and IgM were within normal limits. He developed neck pain which radiate to bilateral shoulders after the accident, this was further evaluated by cervical and lumbar MRI without contrast. The MRI showed diffuse degenerative changes. The low-density lesion in the C5 vertebral body on the CT wasn't not seen in the MRI. His neck pain radiated to b/l shoulders has resolved now.   He had L4-5 disck back surgery 15 years ago in [place], and he has some chronic back pain since then. It has been getting worse in the past few years. He takes Percocet as needed for the back pain. He otherwise feels well, denies any other pain. He works full-time, and remains to be physically active. He denies any fever, chills, or night sweats. No recent weight loss.   CURRENT TREATMENT: Observation  INTERIM HISTORY: Gordon. Phillips returns for follow-up. He is doing okay today. He still has a lot of chronic back pain. His pain is now all over  his body rather than just in his back. If he walks or moves around too much, he has to take breaks because of this pain. Simple movements like getting out of bed or out of a car is very painful. When he is sitting down and not moving around, he feels fine. He hasn't done any PT because he has been working a lot. Denies any other concerns.   MEDICAL HISTORY:  Past Medical History:  Diagnosis Date  . ALLERGIC RHINITIS 01/25/2010  . Hx of cardiovascular stress test    a. Lexiscan Myoview 02/06/13:  EF 59%, no ischemia or scar  . Hx of echocardiogram    a.  Echo 02/03/13:  Mild LVH, mild focal basal septal hypertrophy, EF 50-55%, Gr 1 DD, mild LAE  . HYPERTENSION 01/25/2010  . HYPERTHYROIDISM 01/25/2010  . Impaired glucose tolerance   . Migraines   . Obesity   . OSA (obstructive sleep apnea)    Uses CPAP nightly  . Pericarditis 1980's  . Stroke 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  Marland Kitchen Vitiligo     SURGICAL HISTORY: Past Surgical History:  Procedure Laterality Date  . HERNIA REPAIR     umbilical  . INGUINAL HERNIA REPAIR    . LUMBAR LAMINECTOMY      SOCIAL HISTORY: Social History   Social History  . Marital status: Married    Spouse name: N/A  . Number of children: 2  . Years of education: N/A   Occupational History  .  sales    Social History Main Topics  . Smoking status: Former Smoker    Years: 8.00    Quit date: 11/06/1966  . Smokeless tobacco: Never Used  . Alcohol use Yes     Comment: occasional - max 2x/week  . Drug use: No  . Sexual activity: Not on file   Other Topics Concern  . Not on file   Social History Narrative  . No narrative on file    FAMILY HISTORY: Family History  Problem Relation Age of Onset  . Stroke Father     Passed away in his 40s  . Thyroid disease Sister     goiter  . Sudden death Brother     ? Drug use, no autopsy  . Throat cancer Brother     ALLERGIES:  is allergic to flomax [tamsulosin hcl] and relpax  [eletriptan].  MEDICATIONS:  Current Outpatient Prescriptions  Medication Sig Dispense Refill  . aspirin EC 81 MG tablet Take 1 tablet (81 mg total) by mouth daily.    Marland Kitchen doxazosin (CARDURA) 2 MG tablet Take 1 tablet (2 mg total) by mouth at bedtime. 90 tablet 3  . fluticasone (FLONASE) 50 MCG/ACT nasal spray Place 2 sprays into both nostrils as needed. 16 g 2  . losartan (COZAAR) 100 MG tablet TAKE 1 TABLET DAILY 90 tablet 3  . metoprolol succinate (TOPROL-XL) 100 MG 24 hr tablet TAKE 1 TABLET DAILY 90 tablet 3  . pravastatin (PRAVACHOL) 40 MG tablet TAKE 1 TABLET DAILY 90 tablet 3  . sildenafil (VIAGRA) 100 MG tablet Take 100 mg by mouth daily as needed for erectile dysfunction (erectile dysfunction).     No current facility-administered medications for this visit.     REVIEW OF SYSTEMS:   Constitutional: Denies fevers, chills or abnormal night sweats Eyes: Denies blurriness of vision, double vision or watery eyes Ears, nose, mouth, throat, and face: Denies mucositis or sore throat Respiratory: Denies cough, dyspnea or wheezes Cardiovascular: Denies palpitation, chest discomfort or lower extremity swelling Gastrointestinal:  Denies nausea, heartburn or change in bowel habits Skin: Denies abnormal skin rashes Lymphatics: Denies new lymphadenopathy or easy bruising Neurological:Denies numbness, tingling or new weaknesses Behavioral/Psych: Mood is stable, no new changes  Musculoskeletal: (+) chronic back pain (+) aching whole body All other systems were reviewed with the patient and are negative.  PHYSICAL EXAMINATION: ECOG PERFORMANCE STATUS: 1 - Symptomatic but completely ambulatory  Vitals:   01/22/17 1535  BP: (!) 169/89  Pulse: 83  Resp: 19  Temp: 98.9 F (37.2 C)   Filed Weights   01/22/17 1535  Weight: 267 lb (121.1 kg)   GENERAL:alert, no distress and comfortable SKIN: skin color, texture, turgor are normal, no rashes or significant lesions (+) vitiligo EYES:  normal, conjunctiva are pink and non-injected, sclera clear OROPHARYNX:no exudate, no erythema and lips, buccal mucosa, and tongue normal  NECK: supple, thyroid normal size, non-tender, without nodularity LYMPH:  no palpable lymphadenopathy in the cervical, axillary or inguinal LUNGS: clear to auscultation and percussion with normal breathing effort HEART: regular rate & rhythm and no murmurs and no lower extremity edema ABDOMEN:abdomen soft, non-tender and normal bowel sounds Musculoskeletal:no cyanosis of digits and no clubbing  PSYCH: alert & oriented x 3 with fluent speech NEURO: no focal motor/sensory deficits  LABORATORY DATA:  I have reviewed the data as listed CBC Latest Ref Rng & Units 01/16/2017 07/17/2016 02/28/2016  WBC 4.0 - 10.3 10e3/uL 4.7 6.6 5.4  Hemoglobin 13.0 - 17.1 g/dL 13.6  13.6 13.1  Hematocrit 38.4 - 49.9 % 41.4 40.7 40.0  Platelets 140 - 400 10e3/uL 128(L) 131(L) 129(L)    CMP Latest Ref Rng & Units 01/16/2017 01/16/2017 07/17/2016  Glucose 70 - 140 mg/dl 119 - 83  BUN 7.0 - 26.0 mg/dL 13.6 - 18.4  Creatinine 0.7 - 1.3 mg/dL 1.2 - 1.2  Sodium 136 - 145 mEq/L 141 - 141  Potassium 3.5 - 5.1 mEq/L 4.3 - 4.6  Chloride 96 - 112 mEq/L - - -  CO2 22 - 29 mEq/L 29 - 24  Calcium 8.4 - 10.4 mg/dL 9.7 - 9.5  Total Protein 6.0 - 8.5 g/dL 6.9 7.5 7.8  Total Bilirubin 0.20 - 1.20 mg/dL 0.91 - 0.58  Alkaline Phos 40 - 150 U/L 65 - 60  AST 5 - 34 U/L 18 - 23  ALT 0 - 55 U/L 12 - 18   SPEP/IFE M-protein  05/24/2015: 0.3 07/17/2016: 0.3 01/16/2017: 0.4  Kappa, lamda light chain and ration 07/17/2016: 13.1, 11.5, 1.14  01/16/2017: 12.1, 9.8, 1.23   PATHOLOGY REPORT  Bone Marrow, Aspirate,Biopsy, and Clot, iliac crest 02/08/2015 - HYPERCELLULAR BONE MARROW FOR AGE WITH TRILINEAGE HEMATOPOIESIS INCLUDING ABUNDANT MEGAKARYOCYTES. - 5% PLASMA CELLS. - SEE COMMENT. PERIPHERAL BLOOD: - NO SIGNIFICANT MORPHOLOGIC ABNORMALITIES. Diagnosis Note The bone marrow is hypercellular  for age with trilineage hematopoiesis including abundant megakaryocytes, some of which display atypical features. The significance of latter finding is uncertain at this time and hence correlation with cytogenetic studies is recommended. The plasma cells represent 5% of all cells in the aspirate associated with kappa light chain excess. Given the relatively small number of plasma cells evaluated, the overall features are considered suggestive but not entirely definitive of plasma cell dyscrasia. Clinical and cytogenetic correlation is recommended. (BNS:ecj 02/10/2015)    RADIOGRAPHIC STUDIES: I have personally reviewed the radiological images as listed and agreed with the findings in the report.  Bone Survey 02/28/2016 IMPRESSION: Stable small lytic lesions within the calvarium. Previously seen lucencies in the cervical spine and left greater trochanter less apparent on today's study. No new lytic lesions  Gordon Cervical Spine Wo Contrast 01/14/2015    IMPRESSION: Negative for cervical spine fracture  Moderate cervical disc degeneration and spondylosis.  C5 vertebral lesion on the left on CT is difficult to visualize on MRI however no worrisome lesion is seen in this area.   Electronically Signed   By: Franchot Gallo M.D.   On: 01/14/2015 08:04   Gordon Lumbar Spine Wo Contrast 01/14/2015    IMPRESSION: Extra foraminal disc contacts the exited left L2 root.  Moderate to moderately severe bilateral foraminal narrowing, worse on the right at L3-4 is due to disc with endplate spurring and facet degenerative disease.  Advanced facet arthropathy at L5-S1 results in 0.4 cm anterolisthesis. There is left much worse than right lateral recess narrowing at this level and moderate to moderately severe bilateral foraminal narrowing.  Moderate to moderately severe bilateral foraminal narrowing at L4-5 appears worse on the right. The central canal and right lateral recess are also mildly narrowed at this level.    Electronically Signed   By: Inge Rise M.D.   On: 01/14/2015 10:02   Bone survey 02/08/2015 IMPRESSION: Subtle lucencies are noted in the skull, cervical spine, and left femoral greater trochanter. Very subtle changes of myeloma cannot be completely excluded.   ASSESSMENT & PLAN:  70 y.o. African-American male, with past medical history of Graves' disease, vitiligo, hypertension, dyslipidemia, was incidentally found a  small hypodense lesion in C5 on CT scan. Lab work reviewed a monoclonal paraprotein.  1. MGUS -His initial SPEP reveal a low-level of monoclonal paraprotein (0.27g/dl), immunoglobulin level were normal, he does not have anemia, renal dysfunction, hypercalcemia on the lab test.  -His previous CT scan revealed a small hyper lytic lesion in the C5 vertebral body however was not obvious on the MRI scan. -A bone survey showed subtle lucencies in multiple bones, indeterminate.  -His bone marrow biopsy showed 5% plasma cells, suggestive of plasma cell dyscrasia. Cytogenetics was normal. -We previously discussed the natural history of MGUS, and small possibility (1% every year) of developing multiple myeloma. I recommend routine follow-up  -His repeated lab has showed stable M protein, and normal CBC CMP. -I think his back pain and fatigue unlikely related to his MGUS, lumbar spine in March 2016 showed stenosis, no lytic lesions. -We'll continue monitoring. Given these very stable M protein level, I'll see him with lab every 6 months  3. Mild thrombocytopenia -The platelet count has been in the range of 130 to 153 in the past 2 years, stable overall. -He does have other autoimmune disease including Graves' disease and vitiligo.  - His bone marrow biopsy showed abundant megakaryocytes, some of which display atypical features, the significance is uncertain. -We'll continue follow-up his CBC. Platelet count 128K today   4. Chronic back pain -His lumbar MRI in March 2016 showed  severe degenerative changes, which is likely the cause of his back pain. -I strongly encouraged him to follow-up with his sports medicine Dr. Mina Marble  -He hasn't been doing PT due to being busy at work.  -I have recommended pain medication, he declined -I recommended acupuncture or seeing pain specialist. He will think about these options.   5. Fatigue -possible relate to his back pain  -I encouraged him to follow-up with his primary care physician. His TSH was normal.  6. Hypertension, dyslipidemia -He'll continue follow-up with his primary care physician  Plan -Return to clinic in 6 months with lab one week before    All questions were answered. The patient knows to call the clinic with any problems, questions or concerns.  I spent 15 minutes counseling the patient face to face. The total time spent in the appointment was 20 minutes and more than 50% was on counseling.  This document serves as a record of services personally performed by Truitt Merle, MD. It was created on her behalf by Martinique Casey, a trained medical scribe. The creation of this record is based on the scribe's personal observations and the provider's statements to them. This document has been checked and approved by the attending provider.  I have reviewed the above documentation for accuracy and completeness and I agree with the above.   Truitt Merle, MD 01/22/2017

## 2017-01-22 NOTE — Telephone Encounter (Signed)
Wife diane called to report pt is in a lot of pain and he will not speak up and report it. He is seeing Dr Burr Medico today.

## 2017-01-22 NOTE — Telephone Encounter (Signed)
Gave patient avs report and appointments for September.  °

## 2017-01-23 ENCOUNTER — Encounter: Payer: Self-pay | Admitting: Hematology

## 2017-02-20 ENCOUNTER — Telehealth (HOSPITAL_COMMUNITY): Payer: Self-pay | Admitting: *Deleted

## 2017-02-20 ENCOUNTER — Encounter (HOSPITAL_COMMUNITY): Payer: Self-pay | Admitting: Emergency Medicine

## 2017-02-20 ENCOUNTER — Observation Stay (HOSPITAL_COMMUNITY)
Admission: EM | Admit: 2017-02-20 | Discharge: 2017-02-20 | Disposition: A | Discharge status: Home / Self Care | Payer: Medicare Other | Attending: Family Medicine | Admitting: Family Medicine

## 2017-02-20 ENCOUNTER — Emergency Department (HOSPITAL_COMMUNITY): Payer: Medicare Other

## 2017-02-20 ENCOUNTER — Other Ambulatory Visit: Payer: Self-pay | Admitting: Physician Assistant

## 2017-02-20 DIAGNOSIS — Z79899 Other long term (current) drug therapy: Secondary | ICD-10-CM | POA: Diagnosis not present

## 2017-02-20 DIAGNOSIS — G4733 Obstructive sleep apnea (adult) (pediatric): Secondary | ICD-10-CM | POA: Insufficient documentation

## 2017-02-20 DIAGNOSIS — Z87891 Personal history of nicotine dependence: Secondary | ICD-10-CM | POA: Insufficient documentation

## 2017-02-20 DIAGNOSIS — R079 Chest pain, unspecified: Principal | ICD-10-CM | POA: Diagnosis present

## 2017-02-20 DIAGNOSIS — Z6834 Body mass index (BMI) 34.0-34.9, adult: Secondary | ICD-10-CM | POA: Insufficient documentation

## 2017-02-20 DIAGNOSIS — Z7982 Long term (current) use of aspirin: Secondary | ICD-10-CM | POA: Diagnosis not present

## 2017-02-20 DIAGNOSIS — I10 Essential (primary) hypertension: Secondary | ICD-10-CM | POA: Diagnosis present

## 2017-02-20 DIAGNOSIS — D696 Thrombocytopenia, unspecified: Secondary | ICD-10-CM | POA: Insufficient documentation

## 2017-02-20 DIAGNOSIS — R748 Abnormal levels of other serum enzymes: Secondary | ICD-10-CM | POA: Diagnosis not present

## 2017-02-20 DIAGNOSIS — E785 Hyperlipidemia, unspecified: Secondary | ICD-10-CM | POA: Diagnosis not present

## 2017-02-20 DIAGNOSIS — M25511 Pain in right shoulder: Secondary | ICD-10-CM

## 2017-02-20 DIAGNOSIS — E784 Other hyperlipidemia: Secondary | ICD-10-CM

## 2017-02-20 DIAGNOSIS — R0789 Other chest pain: Secondary | ICD-10-CM | POA: Diagnosis not present

## 2017-02-20 DIAGNOSIS — E7800 Pure hypercholesterolemia, unspecified: Secondary | ICD-10-CM

## 2017-02-20 DIAGNOSIS — D472 Monoclonal gammopathy: Secondary | ICD-10-CM | POA: Diagnosis not present

## 2017-02-20 DIAGNOSIS — R7989 Other specified abnormal findings of blood chemistry: Secondary | ICD-10-CM

## 2017-02-20 DIAGNOSIS — Z8673 Personal history of transient ischemic attack (TIA), and cerebral infarction without residual deficits: Secondary | ICD-10-CM | POA: Diagnosis not present

## 2017-02-20 DIAGNOSIS — R778 Other specified abnormalities of plasma proteins: Secondary | ICD-10-CM | POA: Diagnosis not present

## 2017-02-20 HISTORY — DX: Low back pain: M54.5

## 2017-02-20 HISTORY — DX: Monoclonal gammopathy: D47.2

## 2017-02-20 HISTORY — DX: Pure hypercholesterolemia, unspecified: E78.00

## 2017-02-20 HISTORY — DX: Unspecified osteoarthritis, unspecified site: M19.90

## 2017-02-20 HISTORY — DX: Low back pain, unspecified: M54.50

## 2017-02-20 HISTORY — DX: Obstructive sleep apnea (adult) (pediatric): G47.33

## 2017-02-20 HISTORY — DX: Dependence on other enabling machines and devices: Z99.89

## 2017-02-20 HISTORY — DX: Other chronic pain: G89.29

## 2017-02-20 LAB — BASIC METABOLIC PANEL
Anion gap: 7 (ref 5–15)
BUN: 20 mg/dL (ref 6–20)
CO2: 27 mmol/L (ref 22–32)
Calcium: 9.6 mg/dL (ref 8.9–10.3)
Chloride: 105 mmol/L (ref 101–111)
Creatinine, Ser: 1.08 mg/dL (ref 0.61–1.24)
GFR calc Af Amer: >60 mL/min (ref >60)
GFR calc non Af Amer: >60 mL/min (ref >60)
Glucose, Bld: 123 mg/dL — ABNORMAL HIGH (ref 65–99)
Potassium: 3.9 mmol/L (ref 3.5–5.1)
Sodium: 139 mmol/L (ref 135–145)

## 2017-02-20 LAB — CBC
HCT: 40.2 % (ref 39.0–52.0)
Hemoglobin: 13.3 g/dL (ref 13.0–17.0)
MCH: 30 pg (ref 26.0–34.0)
MCHC: 33.1 g/dL (ref 30.0–36.0)
MCV: 90.5 fL (ref 78.0–100.0)
Platelets: 125 10*3/uL — ABNORMAL LOW (ref 150–400)
RBC: 4.44 MIL/uL (ref 4.22–5.81)
RDW: 14.3 % (ref 11.5–15.5)
WBC: 6.4 10*3/uL (ref 4.0–10.5)

## 2017-02-20 LAB — I-STAT TROPONIN, ED: Troponin i, poc: 0.02 ng/mL (ref 0.00–0.08)

## 2017-02-20 LAB — TROPONIN I
Troponin I: 0.07 ng/mL (ref <0.03)
Troponin I: 0.07 ng/mL (ref <0.03)
Troponin I: 0.07 ng/mL (ref <0.03)

## 2017-02-20 MED ORDER — DOXAZOSIN MESYLATE 2 MG PO TABS: 2.0000 mg | ORAL_TABLET | Freq: Every day | ORAL | Status: DC

## 2017-02-20 MED ORDER — HEPARIN SODIUM (PORCINE) 5000 UNIT/ML IJ SOLN: 5000.0000 [IU] | Freq: Three times a day (TID) | INTRAMUSCULAR | Status: DC

## 2017-02-20 MED ORDER — NITROGLYCERIN 0.4 MG SL SUBL: 0.4000 mg | SUBLINGUAL_TABLET | SUBLINGUAL | 12 refills | Status: DC | PRN

## 2017-02-20 MED ORDER — HEPARIN SODIUM (PORCINE) 5000 UNIT/ML IJ SOLN
5000.0000 [IU] | Freq: Three times a day (TID) | INTRAMUSCULAR | Status: DC
  Filled 2017-02-20 (×2): qty 1

## 2017-02-20 MED ORDER — GI COCKTAIL ~~LOC~~: 30.0000 mL | Freq: Four times a day (QID) | ORAL | Status: DC | PRN

## 2017-02-20 MED ORDER — ONDANSETRON HCL 4 MG/2ML IJ SOLN: 4.0000 mg | Freq: Four times a day (QID) | INTRAMUSCULAR | Status: DC | PRN

## 2017-02-20 MED ORDER — METOPROLOL SUCCINATE ER 100 MG PO TB24
100.0000 mg | ORAL_TABLET | Freq: Every day | ORAL | Status: DC
  Filled 2017-02-20: qty 1

## 2017-02-20 MED ORDER — ONDANSETRON HCL 4 MG PO TABS: 4.0000 mg | ORAL_TABLET | Freq: Four times a day (QID) | ORAL | Status: DC | PRN

## 2017-02-20 MED ORDER — MORPHINE SULFATE (PF) 2 MG/ML IV SOLN: 2.0000 mg | INTRAVENOUS | Status: DC | PRN

## 2017-02-20 MED ORDER — ACETAMINOPHEN 650 MG RE SUPP: 650.0000 mg | Freq: Four times a day (QID) | RECTAL | Status: DC | PRN

## 2017-02-20 MED ORDER — SODIUM CHLORIDE 0.9% FLUSH: 3.0000 mL | Freq: Two times a day (BID) | INTRAVENOUS | Status: DC

## 2017-02-20 MED ORDER — NITROGLYCERIN 0.4 MG SL SUBL
0.4000 mg | SUBLINGUAL_TABLET | SUBLINGUAL | Status: DC | PRN
  Administered 2017-02-20 (×2): 0.4 mg via SUBLINGUAL
  Filled 2017-02-20 (×2): qty 1

## 2017-02-20 MED ORDER — SODIUM CHLORIDE 0.9% FLUSH: 3.0000 mL | INTRAVENOUS | Status: DC | PRN

## 2017-02-20 MED ORDER — ACETAMINOPHEN 325 MG PO TABS: 650.0000 mg | ORAL_TABLET | Freq: Four times a day (QID) | ORAL | Status: DC | PRN

## 2017-02-20 MED ORDER — LOSARTAN POTASSIUM 50 MG PO TABS
100.0000 mg | ORAL_TABLET | Freq: Every day | ORAL | Status: DC
  Filled 2017-02-20: qty 2

## 2017-02-20 MED ORDER — PRAVASTATIN SODIUM 40 MG PO TABS
40.0000 mg | ORAL_TABLET | Freq: Every day | ORAL | Status: DC
  Filled 2017-02-20: qty 1

## 2017-02-20 MED ORDER — ASPIRIN EC 81 MG PO TBEC
81.0000 mg | DELAYED_RELEASE_TABLET | Freq: Every day | ORAL | Status: DC
  Filled 2017-02-20: qty 1

## 2017-02-20 MED ORDER — SODIUM CHLORIDE 0.9 % IV SOLN: 250.0000 mL | INTRAVENOUS | Status: DC | PRN

## 2017-02-20 NOTE — ED Triage Notes (Signed)
Pt presents from home with CP since yesterday; pt states pain is medial and radiates to R arm, neck and back; pt took 4 TUMS and 1 gas-x throughout the night;

## 2017-02-20 NOTE — ED Provider Notes (Signed)
TIME SEEN: 3:41 AM  02/20/2017  CHIEF COMPLAINT: Chest pain  HPI: This is a 71 year old male with history of hyperthyroidism, hypertension, HLD, obesity, pericarditis, and ischemic stroke who presents to the ED for evaluation of chest pain. This patient states that in the last 2-3 weeks he has experienced mild chest pain usually with exertion. Resolves at rest. In the last 48 hours his symptoms have been more significant, centrally located. Worse with exertion. Better with rest but not completely resolved. Described as tightness and pressure but also a "soreness". In the last several hours, pain was constant and severe. Also pain in the R shoulder, upper arm. However, since presentation in triage his symptoms have improved and now mild. He took antiacid and gas-x medications pre-hospital. Also two and 24 mg aspirin. He is denying any dyspnea currently. No diaphoresis. No nausea or vomiting. No dizziness. Wife reports she has noted diaphoresis however. He had a negative stress test in 2016. No prior catheterizations. No peripheral edema. No family history of CAD. Quit tobacco 40 years ago.   ROS: See HPI Constitutional: no fever  Eyes: no drainage  ENT: no runny nose   Cardiovascular:  + chest pain  Resp: + SOB  GI: no vomiting GU: no dysuria Integumentary: no rash  Allergy: no hives  Musculoskeletal: no leg swelling  Neurological: no slurred speech ROS otherwise negative  PAST MEDICAL HISTORY/PAST SURGICAL HISTORY:  Past Medical History:  Diagnosis Date  . ALLERGIC RHINITIS 01/25/2010  . Hx of cardiovascular stress test    a. Lexiscan Myoview 02/06/13:  EF 59%, no ischemia or scar  . Hx of echocardiogram    a.  Echo 02/03/13:  Mild LVH, mild focal basal septal hypertrophy, EF 50-55%, Gr 1 DD, mild LAE  . HYPERTENSION 01/25/2010  . HYPERTHYROIDISM 01/25/2010  . Impaired glucose tolerance   . Migraines   . Obesity   . OSA (obstructive sleep apnea)    Uses CPAP nightly  . Pericarditis 1980's   . 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  Marland Kitchen Vitiligo     MEDICATIONS:  Prior to Admission medications   Medication Sig Start Date End Date Taking? Authorizing Provider  aspirin EC 81 MG tablet Take 1 tablet (81 mg total) by mouth daily. 02/03/13   Barton Dubois, MD  doxazosin (CARDURA) 2 MG tablet Take 1 tablet (2 mg total) by mouth at bedtime. 04/17/16   Marletta Lor, MD  fluticasone [facility]) 50 MCG/ACT nasal spray Place 2 sprays into both nostrils as needed. 10/21/15 02/06/17  Marletta Lor, MD  losartan (COZAAR) 100 MG tablet TAKE 1 TABLET DAILY 07/17/16   Marletta Lor, MD  metoprolol succinate (TOPROL-XL) 100 MG 24 hr tablet TAKE 1 TABLET DAILY 07/17/16   Marletta Lor, MD  pravastatin (PRAVACHOL) 40 MG tablet TAKE 1 TABLET DAILY 07/17/16   Marletta Lor, MD  sildenafil (VIAGRA) 100 MG tablet Take 100 mg by mouth daily as needed for erectile dysfunction (erectile dysfunction).    Historical Provider, MD    ALLERGIES:  Allergies  Allergen Reactions  . Flomax [Tamsulosin Hcl] Itching  . Relpax [Eletriptan]     Chest pain, hypertension, sweating    SOCIAL HISTORY:  Social History  Substance Use Topics  . Smoking status: Former Smoker    Years: 8.00    Quit date: 11/06/1966  . Smokeless tobacco: Never Used  . Alcohol use Yes     Comment: occasional -  max 2x/week    FAMILY HISTORY: Family History  Problem Relation Age of Onset  . Stroke Father     Passed away in his 8s  . Thyroid disease Sister     goiter  . Sudden death Brother     ? Drug use, no autopsy  . Throat cancer Brother     EXAM: BP (!) 160/100   Pulse 82   Temp 97.7 F (36.5 C) (Oral)   Resp 20   Ht 6\' 2"  (1.88 m)   Wt 259 lb (117.5 kg)   SpO2 97%   BMI 33.25 kg/m  CONSTITUTIONAL: Alert and oriented and responds appropriately to questions. Well-appearing; well-nourished HEAD: Normocephalic EYES: Conjunctivae clear, pupils appear equal, EOMI ENT:  normal nose; moist mucous membranes NECK: Supple, no meningismus, no nuchal rigidity, no LAD  CARD: RRR; S1 and S2 appreciated; no murmurs, no clicks, no rubs, no gallops; 2+ radial and DP pulses bilaterally RESP: Normal chest excursion without splinting or tachypnea; breath sounds clear and equal bilaterally; no wheezes, no rhonchi, no rales, no hypoxia or respiratory distress, speaking full sentences ABD/GI: Normal bowel sounds; non-distended; soft, non-tender, no rebound, no guarding, no peritoneal signs, no hepatosplenomegaly BACK:  The back appears normal and is non-tender to palpation, there is no CVA tenderness EXT: Normal ROM in all joints; non-tender to palpation; no edema; normal capillary refill; no cyanosis, no calf tenderness or swelling    SKIN: Normal color for age and race; warm; no rash NEURO: Moves all extremities equally PSYCH: The patient's mood and manner are appropriate. Grooming and personal hygiene are appropriate.  MEDICAL DECISION MAKING: Patient here with concerning symptoms for exertional chest pain. Take aspirin full dose 2 prior to arrival. [facility] give nitroglycerin. Will obtain cardiac labs, chest x-ray. Doubt PE, dissection. I feel he will need admission for chest pain rule out. Heart score is 4. No recent provocative testing.  ED PROGRESS: Patient reports chest pressure and tightness, after nitroglycerin but still having some soreness. First troponin negative. Chest x-ray clear. Recommended admission for a chest pain rule out and patient and wife are comfortable with this plan. We'll discuss with medicine for admission. PCP is with [facility].   4:37 AM Discussed patient's case with hospitalist, Dr. Alcario Drought.  I have recommended admission and patient (and family if present) agree with this plan. Admitting physician will place admission orders.   I reviewed all nursing notes, vitals, pertinent previous records, EKGs, lab and urine results, imaging (as available).      EKG Interpretation  Date/Time:  Tuesday February 20 2017 03:16:28 EDT Ventricular Rate:  77 PR Interval:  236 QRS Duration: 92 QT Interval:  398 QTC Calculation: 450 R Axis:   51 Text Interpretation:  Sinus rhythm with 1st degree A-V block Otherwise normal ECG No significant change since last tracing Confirmed by WARD,  DO, KRISTEN [phone]) on 02/20/2017 3:33:20 AM        I personally performed the services described in this documentation, which was scribed in my presence. The recorded information has been reviewed and is accurate.    [place], DO 02/20/17 [phone]

## 2017-02-20 NOTE — H&P (Signed)
History and Physical    Gordon Phillips YJE:[id number] DOB: 1946-04-09 DOA: 02/20/2017  PCP: Nyoka Cowden, MD Patient coming from: Home   Chief Complaint: CP  HPI: Gordon Phillips is a 71 y.o. male with medical history significant of environmental allergies, HTN, thyroid dysfunction, migraines, OSA, pericarditis, CVA, vitiligo presenting w/ CP. Chest pain started 2-3 weeks ago with intermittent episodes typically with exertion. These episodes would resolve at rest. Over the last 2 day symptoms have become significantly worse. Described as substernal tightness or pressure and soreness. No longer completely resolve w/ rest. At time of arrival to ED pain was constant and worsening. Radiating to R shoulder and RUE. Pt did take Gas-x and antacid and 2 ASA w/ some improvement prior to arrival. Denies n/v, LE edema, diaphoresis, dyspnea, dizziness, abd pain, diarrhea. Wife states pt was diaphoretic. Reports a normal stress in 2014.    ED Course: Objective findings outlined below. Given nitroglycerin with improvement in pain.  Review of Systems: As per HPI otherwise 10 point review of systems negative.   Ambulatory Status:no restrictions  Past Medical History:  Diagnosis Date  . ALLERGIC RHINITIS 01/25/2010  . Hx of cardiovascular stress test    a. Lexiscan Myoview 02/06/13:  EF 59%, no ischemia or scar  . Hx of echocardiogram    a.  Echo 02/03/13:  Mild LVH, mild focal basal septal hypertrophy, EF 50-55%, Gr 1 DD, mild LAE  . HYPERTENSION 01/25/2010  . HYPERTHYROIDISM 01/25/2010  . Impaired glucose tolerance   . Migraines   . Obesity   . OSA (obstructive sleep apnea)    Uses CPAP nightly  . Pericarditis 1980's  . 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  Marland Kitchen Vitiligo     Past Surgical History:  Procedure Laterality Date  . HERNIA REPAIR     umbilical  . INGUINAL HERNIA REPAIR    . LUMBAR LAMINECTOMY      Social History   Social History  . Marital status:  Married    Spouse name: N/A  . Number of children: 2  . Years of education: N/A   Occupational History  . sales    Social History Main Topics  . Smoking status: Former Smoker    Years: 8.00    Quit date: 11/06/1966  . Smokeless tobacco: Never Used  . Alcohol use Yes     Comment: occasional - max 2x/week  . Drug use: No  . Sexual activity: Not on file   Other Topics Concern  . Not on file   Social History Narrative  . No narrative on file    Allergies  Allergen Reactions  . Flomax [Tamsulosin Hcl] Itching  . Relpax [Eletriptan]     Chest pain, hypertension, sweating    Family History  Problem Relation Age of Onset  . Stroke Father     Passed away in his 22s  . Thyroid disease Sister     goiter  . Sudden death Brother     ? Drug use, no autopsy  . Throat cancer Brother     Prior to Admission medications   Medication Sig Start Date End Date Taking? Authorizing Provider  aspirin EC 81 MG tablet Take 1 tablet (81 mg total) by mouth daily. Patient taking differently: Take 81-325 mg by mouth daily.  02/03/13  Yes Barton Dubois, MD  doxazosin (CARDURA) 2 MG tablet Take 1 tablet (2 mg total) by mouth at bedtime. 04/17/16  Yes Marletta Lor, MD  fluticasone (FLONASE) 50 MCG/ACT nasal spray Place 2 sprays into both nostrils as needed. Patient taking differently: Place 2 sprays into both nostrils daily as needed for allergies.  10/21/15 02/20/17 Yes Marletta Lor, MD  losartan (COZAAR) 100 MG tablet TAKE 1 TABLET DAILY 07/17/16  Yes Marletta Lor, MD  metoprolol succinate (TOPROL-XL) 100 MG 24 hr tablet TAKE 1 TABLET DAILY 07/17/16  Yes Marletta Lor, MD  pravastatin (PRAVACHOL) 40 MG tablet TAKE 1 TABLET DAILY 07/17/16  Yes Marletta Lor, MD  sildenafil (VIAGRA) 100 MG tablet Take 100 mg by mouth daily as needed for erectile dysfunction (erectile dysfunction).   Yes Historical Provider, MD  sodium chloride (OCEAN) 0.65 % SOLN nasal spray Place 1 spray  into both nostrils daily as needed for congestion.   Yes Historical Provider, MD    Physical Exam: Vitals:   02/20/17 0318 02/20/17 0319 02/20/17 0400 02/20/17 0534  BP:  (!) 160/100 121/84 (!) 155/81  Pulse:   100 75  Resp:   14 18  Temp:    97.7 F (36.5 C)  TempSrc:    Oral  SpO2:   95% 97%  Weight: 117.5 kg (259 lb)   121.1 kg (267 lb)  Height: 6\' 2"  (1.88 m)   6\' 2"  (1.88 m)     General:  Appears calm and comfortable Eyes:  PERRL, EOMI, normal lids, iris ENT:  grossly normal hearing, lips & tongue, mmm Neck:  no LAD, masses or thyromegaly Cardiovascular:  RRR, no m/r/g. Trace LE edema.  Respiratory:  CTA bilaterally, no w/r/r. Normal respiratory effort. Abdomen:  soft, ntnd, NABS Skin:  Loss of melena across wide areas of hands, head, and body in general.   Musculoskeletal:  grossly normal tone BUE/BLE, good ROM, no bony abnormality Psychiatric:  grossly normal mood and affect, speech fluent and appropriate, AOx3 Neurologic:  CN 2-12 grossly intact, moves all extremities in coordinated fashion, sensation intact  Labs on Admission: I have personally reviewed following labs and imaging studies  CBC:  Recent Labs Lab 02/20/17 0347  WBC 6.4  HGB 13.3  HCT 40.2  MCV 90.5  PLT 660*   Basic Metabolic Panel:  Recent Labs Lab 02/20/17 0347  NA 139  K 3.9  CL 105  CO2 27  GLUCOSE 123*  BUN 20  CREATININE 1.08  CALCIUM 9.6   GFR: Estimated Creatinine Clearance: 89.3 mL/min (by C-G formula based on SCr of 1.08 mg/dL). Liver Function Tests: No results for input(s): AST, ALT, ALKPHOS, BILITOT, PROT, ALBUMIN in the last 168 hours. No results for input(s): LIPASE, AMYLASE in the last 168 hours. No results for input(s): AMMONIA in the last 168 hours. Coagulation Profile: No results for input(s): INR, PROTIME in the last 168 hours. Cardiac Enzymes:  Recent Labs Lab 02/20/17 0354  TROPONINI 0.07*   BNP (last 3 results) No results for input(s): PROBNP in the  last 8760 hours. HbA1C: No results for input(s): HGBA1C in the last 72 hours. CBG: No results for input(s): GLUCAP in the last 168 hours. Lipid Profile: No results for input(s): CHOL, HDL, LDLCALC, TRIG, CHOLHDL, LDLDIRECT in the last 72 hours. Thyroid Function Tests: No results for input(s): TSH, T4TOTAL, FREET4, T3FREE, THYROIDAB in the last 72 hours. Anemia Panel: No results for input(s): VITAMINB12, FOLATE, FERRITIN, TIBC, IRON, RETICCTPCT in the last 72 hours. Urine analysis:    Component Value Date/Time   COLORURINE YELLOW 02/03/2013 0140   APPEARANCEUR CLEAR 02/03/2013 0140   LABSPEC 1.023 02/03/2013 0140  PHURINE 7.0 02/03/2013 0140   GLUCOSEU NEGATIVE 02/03/2013 0140   HGBUR NEGATIVE 02/03/2013 0140   BILIRUBINUR n 04/27/2014 1123   KETONESUR NEGATIVE 02/03/2013 0140   PROTEINUR n 04/27/2014 1123   PROTEINUR NEGATIVE 02/03/2013 0140   UROBILINOGEN 0.2 04/27/2014 1123   UROBILINOGEN 1.0 02/03/2013 0140   NITRITE n 04/27/2014 1123   NITRITE NEGATIVE 02/03/2013 0140   LEUKOCYTESUR Negative 04/27/2014 1123    Creatinine Clearance: Estimated Creatinine Clearance: 89.3 mL/min (by C-G formula based on SCr of 1.08 mg/dL).  Sepsis Labs: @LABRCNTIP (procalcitonin:4,lacticidven:4) )No results found for this or any previous visit (from the past 240 hour(s)).   Radiological Exams on Admission: Dg Chest 2 View  Result Date: 02/20/2017 CLINICAL DATA:  Mid chest pain with pain down the right arm since yesterday. EXAM: CHEST  2 VIEW COMPARISON:  02/02/2013 FINDINGS: Normal heart size and pulmonary vascularity. Linear atelectasis or fibrosis in the lung bases similar to prior study. No focal consolidation or airspace disease. No blunting of costophrenic angles. No pneumothorax. Mediastinal contours appear intact. IMPRESSION: No active cardiopulmonary disease. Electronically Signed   By: Lucienne Capers M.D.   On: 02/20/2017 04:12    EKG: Independently reviewed. 1st av block. No  ACS. No change from previous  Assessment/Plan Active Problems:   Essential hypertension   Morbid obesity (HCC)   Chest pain, rule out acute myocardial infarction   HLD (hyperlipidemia)   History of CVA (cerebrovascular accident)   CP: cardiac vs GI vs MSK vs pleuritic. Nml lexiscan 06/2016. HEART score 6. Relieved w/ ASA and nitro. May have concurrent GI and MSK involvement. CXR nml. Doubt etiology such as PUD or cholecystitis despite R shoulder pain. Cardiology consulted and will evaluate for possible coronary CT vs additional workup options (outpatient???). Pt initially expressed not wanting a cath - cycle trop, EKG in am - ASA - Gi cocktail prn - f/u cards recs.   HTN: - continue Metop, losartan, cardura,   HLD: - continue statin  h/o CVA: no deficits - ASA   DVT prophylaxis: Hep Code Status: full  Family Communication: wife  Disposition Plan: pending workup, r/o  Consults called: Cardiology  Admission status: observation    Gaetano Romberger J MD [facility]  If 7PM-7AM, please contact night-coverage [email or web address] Password [facility]  02/20/2017, 10:22 AM

## 2017-02-20 NOTE — Discharge Summary (Signed)
Physician Discharge Summary  Gordon Phillips TIR:[id number] DOB: Sep 09, 1946 DOA: 02/20/2017  PCP: Nyoka Cowden, MD  Admit date: 02/20/2017 Discharge date: 02/20/2017  Admitted From: Home Disposition:  Home  Recommendations for Outpatient Follow-up:  1. Myocardial perfusion study on 02/22/2017 at 09 45 2. Use some legal nitroglycerin as recommended with return precautions discussed  Home Health:No  Equipment/Devices:None  Discharge Condition:stable CODE STATUS:FULL Diet recommendation: Regular  Brief/Interim Summary: Gordon Phillips is a 71 y.o. male with medical history significant of environmental allergies, HTN, thyroid dysfunction, migraines, OSA, pericarditis, CVA, vitiligo presenting w/ CP. Chest pain started 2-3 weeks ago with intermittent episodes typically with exertion. These episodes would resolve at rest. Over the last 2 day symptoms have become significantly worse. Described as substernal tightness or pressure and soreness. No longer completely resolve w/ rest. At time of arrival to ED pain was constant and worsening. Radiating to R shoulder and RUE. Pt did take Gas-x and antacid and 2 ASA w/ some improvement prior to arrival. Denies n/v, LE edema, diaphoresis, dyspnea, dizziness, abd pain, diarrhea. Wife states pt was diaphoretic. Reports a normal stress in 2014.   Discharge Diagnoses:  Active Problems:   Essential hypertension   Morbid obesity (HCC)   Chest pain, rule out acute myocardial infarction   HLD (hyperlipidemia)   History of CVA (cerebrovascular accident)   Right shoulder pain   Elevated troponin  CP: Troponins were cycled 2 which remained flat at 0.07. Nuclear medicine study as outlined above. Heart score 6. Extensive discussions had with patient and cardiology team as well as with [facility] providers regarding possibility of symptoms being related to CAD. Patient deferring a catheterization despite recommendations by cardiology at this time. Patient has opted  for myocardial perfusion study instead. This has been set up through cardiology team and greatly appreciate their assistance. Patient was given a prescription for sublingual nitroglycerin and very strict return precautions at time of discharge.  Right shoulder pain. Likely related to atypical cardiac pain. Cannot entirely exclude pleurisy as patient states some elements of the pain is reproducible on deep respirations. Doubt arthritic or gastroenteric digital etiology but cannot entirely exclude. No evidence of cervical radiculopathy or injury associated dictating further workup such as imaging studies.     Discharge Instructions Patient is to follow-up for perfusion study as outlined above. Continue home regimen.  Allergies as of 02/20/2017      Reactions   Flomax [tamsulosin Hcl] Itching   Relpax [eletriptan]    Chest pain, hypertension, sweating      Medication List    TAKE these medications   aspirin EC 81 MG tablet Take 1 tablet (81 mg total) by mouth daily. What changed:  how much to take   doxazosin 2 MG tablet Commonly known as:  CARDURA Take 1 tablet (2 mg total) by mouth at bedtime.   fluticasone 50 MCG/ACT nasal spray Commonly known as:  FLONASE Place 2 sprays into both nostrils as needed. What changed:  when to take this  reasons to take this   losartan 100 MG tablet Commonly known as:  COZAAR TAKE 1 TABLET DAILY   metoprolol succinate 100 MG 24 hr tablet Commonly known as:  TOPROL-XL TAKE 1 TABLET DAILY   nitroGLYCERIN 0.4 MG SL tablet Commonly known as:  NITROSTAT Place 1 tablet (0.4 mg total) under the tongue every 5 (five) minutes as needed for chest pain.   pravastatin 40 MG tablet Commonly known as:  PRAVACHOL TAKE 1 TABLET DAILY   sildenafil 100 MG  tablet Commonly known as:  VIAGRA Take 100 mg by mouth daily as needed for erectile dysfunction (erectile dysfunction).   sodium chloride 0.65 % Soln nasal spray Commonly known as:  OCEAN Place 1  spray into both nostrils daily as needed for congestion.      Follow-up Information    [facility]. Go on 02/22/2017.   Why:  @ 9:45am. Nothing to eat/drink after midnight the night before. You can take your medications with small sip of water Contact information: [place] [address] [phone]       Angelena Form, PA-C. Go on 03/06/2017.   Specialties:  Cardiology, Radiology Why:  @ 8am for follow up.  Contact information: [address] [place] [address] [phone]          Allergies  Allergen Reactions  . Flomax [Tamsulosin Hcl] Itching  . Relpax [Eletriptan]     Chest pain, hypertension, sweating    Consultations:  Cardiology   Procedures/Studies: Dg Chest 2 View  Result Date: 02/20/2017 CLINICAL DATA:  Mid chest pain with pain down the right arm since yesterday. EXAM: CHEST  2 VIEW COMPARISON:  02/02/2013 FINDINGS: Normal heart size and pulmonary vascularity. Linear atelectasis or fibrosis in the lung bases similar to prior study. No focal consolidation or airspace disease. No blunting of costophrenic angles. No pneumothorax. Mediastinal contours appear intact. IMPRESSION: No active cardiopulmonary disease. Electronically Signed   By: Lucienne Capers M.D.   On: 02/20/2017 04:12       Subjective:   Discharge Exam: Vitals:   02/20/17 1224 02/20/17 1352  BP: 139/84 (!) 146/88  Pulse: 74 74  Resp:  20  Temp:  98 F (36.7 C)   Vitals:   02/20/17 0400 02/20/17 0534 02/20/17 1224 02/20/17 1352  BP: 121/84 (!) 155/81 139/84 (!) 146/88  Pulse: 100 75 74 74  Resp: 14 18  20   Temp:  97.7 F (36.5 C)  98 F (36.7 C)  TempSrc:  Oral  Oral  SpO2: 95% 97%  95%  Weight:  121.1 kg (267 lb)    Height:  6\' 2"  (1.88 m)      General: Pt is alert, awake, not in acute distress Cardiovascular: RRR, S1/S2 +, no rubs, no gallops Respiratory: CTA  bilaterally, no wheezing, no rhonchi Abdominal: Soft, NT, ND, bowel sounds + Extremities: no edema, no cyanosis    The results of significant diagnostics from this hospitalization (including imaging, microbiology, ancillary and laboratory) are listed below for reference.     Microbiology: No results found for this or any previous visit (from the past 240 hour(s)).   Labs: BNP (last 3 results) No results for input(s): BNP in the last 8760 hours. Basic Metabolic Panel:  Recent Labs Lab 02/20/17 0347  NA 139  K 3.9  CL 105  CO2 27  GLUCOSE 123*  BUN 20  CREATININE 1.08  CALCIUM 9.6   Liver Function Tests: No results for input(s): AST, ALT, ALKPHOS, BILITOT, PROT, ALBUMIN in the last 168 hours. No results for input(s): LIPASE, AMYLASE in the last 168 hours. No results for input(s): AMMONIA in the last 168 hours. CBC:  Recent Labs Lab 02/20/17 0347  WBC 6.4  HGB 13.3  HCT 40.2  MCV 90.5  PLT 125*   Cardiac Enzymes:  Recent Labs Lab 02/20/17 0354 02/20/17 1100 02/20/17 1615  TROPONINI 0.07* 0.07* 0.07*   BNP: Invalid input(s): POCBNP CBG: No results for input(s):  GLUCAP in the last 168 hours. D-Dimer No results for input(s): DDIMER in the last 72 hours. Hgb A1c No results for input(s): HGBA1C in the last 72 hours. Lipid Profile No results for input(s): CHOL, HDL, LDLCALC, TRIG, CHOLHDL, LDLDIRECT in the last 72 hours. Thyroid function studies No results for input(s): TSH, T4TOTAL, T3FREE, THYROIDAB in the last 72 hours.  Invalid input(s): FREET3 Anemia work up No results for input(s): VITAMINB12, FOLATE, FERRITIN, TIBC, IRON, RETICCTPCT in the last 72 hours. Urinalysis    Component Value Date/Time   COLORURINE YELLOW 02/03/2013 0140   APPEARANCEUR CLEAR 02/03/2013 0140   LABSPEC 1.023 02/03/2013 0140   PHURINE 7.0 02/03/2013 0140   GLUCOSEU NEGATIVE 02/03/2013 0140   HGBUR NEGATIVE 02/03/2013 0140   BILIRUBINUR n 04/27/2014 1123   KETONESUR  NEGATIVE 02/03/2013 0140   PROTEINUR n 04/27/2014 1123   PROTEINUR NEGATIVE 02/03/2013 0140   UROBILINOGEN 0.2 04/27/2014 1123   UROBILINOGEN 1.0 02/03/2013 0140   NITRITE n 04/27/2014 1123   NITRITE NEGATIVE 02/03/2013 0140   LEUKOCYTESUR Negative 04/27/2014 1123   Sepsis Labs Invalid input(s): PROCALCITONIN,  WBC,  LACTICIDVEN Microbiology No results found for this or any previous visit (from the past 240 hour(s)).   Time coordinating discharge: Over 30 minutes  SIGNED:   Waldemar Dickens, MD  [facility] 02/20/2017, 5:21 PM Pager   If 7PM-7AM, please contact night-coverage [email or web address] Password [email or web address]
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
## 2017-02-20 NOTE — ED Notes (Signed)
Hospitalist at the bedside 

## 2017-02-20 NOTE — ED Notes (Signed)
Pt back from XR 

## 2017-02-20 NOTE — Telephone Encounter (Signed)
Patient is in hospital on [employer]. I spoke with patients nurse.Patient's nurse Delsa Sale given detailed instructions per Myocardial Perfusion Study Information Sheet for the test on 02/22/17 at 0945. Patient notified to arrive 15 minutes early and that it is imperative to arrive on time for appointment to keep from having the test rescheduled.  If you need to cancel or reschedule your appointment, please call the office within 24 hours of your appointment. Failure to do so may result in a cancellation of your appointment, and a $50 no show fee. Patient verbalized understanding.Gordon Phillips, Gordon Phillips

## 2017-02-20 NOTE — Discharge Instructions (Addendum)
There is concern that the cause of your symptoms is due to a MI or myocardial ischemia. This is from lack of blood flow to the heart from narrowing of the coronary arteries. Fortunately you are not actively having significant injury to your heart. Your shoulder discomfort may be from your heart, lungs, GI tract, neck nerves or arthritis of the shoulder, or even something called a psychosomatic condition. Please follow up as directed by the cardiology team and use the nitro as needed. Best of luck      You have a Stress Test scheduled at [facility]. Your doctor has ordered this test to check the blood flow in your heart arteries.  Please arrive 15 minutes early for paperwork. The whole test will take several hours. You may want to bring reading material to remain occupied while undergoing different parts of the test.  Instructions:  No food/drink after midnight the night before.  It is OK to take your morning meds with a sip of water   No caffeine/decaf products 24 hours before, including medicines such as Excedrin or Goody Powders. Call if there are any questions.   Wear comfortable clothes and shoes.   Special Medication Instructions:  Remove nitroglycerin patches and do not take nitrate preparations such as Imdur/isosorbide the day of your test.  No Persantine/Theophylline or Aggrenox medicines should be used within 24 hours of the test.   If you are diabetic, please ask which medications to hold the day of the test.  What To Expect: When you arrive in the lab, the technician will inject a small amount of radioactive tracer into your arm through an IV while you are resting quietly. This helps [place] to form pictures of your heart. You will likely only feel a sting from the IV. After a waiting period, resting pictures will be obtained under a big camera. These are the "before" pictures.  Next, you will be prepped for the stress portion of the test. This may  include either walking on a treadmill or receiving a medicine that helps to dilate blood vessels in your heart to simulate the effect of exercise on your heart. If you are walking on a treadmill, you will walk at different paces to try to get your heart rate to a goal number that is based on your age. If your doctor has chosen the pharmacologic test, then you will receive a medicine through your IV that may cause temporary nausea, flushing, shortness of breath and sometimes chest discomfort or vomiting. This is typically short-lived and usually resolves quickly. If you experience symptoms, that does not automatically mean the test is abnormal. Some patients do not experience any symptoms at all. Your blood pressure and heart rate will be monitored, and we will be watching your EKG on a computer screen for any changes. During this portion of the test, the radiologist will inject another small amount of radioactive tracer into your IV. After a waiting period, you will undergo a second set of pictures. These are the "after" pictures.  The doctor reading the test will compare the before-and-after images to look for evidence of heart blockages or heart weakness. The test usually takes 1 day to complete, but in certain instances (for example, if a patient is over a certain weight limit), the test may be done over the span of 2 days.

## 2017-02-20 NOTE — Consult Note (Signed)
CARDIOLOGY CONSULT NOTE   Patient ID: Gordon Phillips MRN: [id number] DOB/AGE: [birth year] 71 y.o.  Admit date: 02/20/2017  Requesting Physician: Dr. Marily Memos  Primary Physician:   Nyoka Cowden, MD Primary Cardiologist:  Dr. Aundra Dubin Reason for Consultation: chest pain  Gordon Phillips is a 71 y.o. male who is being seen today for the evaluation of chest pain at the request of Dr. Marily Memos.   HPI: Gordon Phillips is a 71 y.o. male with a history of HTN, OSA, MUGUS, CVA by imaging, remote pericarditis and migraine HAs who presented to [facility] on 02/20/17 with chest pain.   He presented with CP back in 2014 shortly after taking the Relpax (triptan). BP was 240/140. Of note, he had an old lacunar infarct in L basal ganglia on Head CT (patient was unaware of prior stroke). Echo 02/03/13: Mild LVH, mild focal basal septal hypertrophy, EF 50-55%, Gr 1 DD, mild LAE. CEs remained negative. He was placed on a statin. In light of his remote stroke on CT and migraine HAs, OP neurology consultation was recommended. OP myoview was recommended as well. Lexiscan Myoview 02/06/13: EF 59%, no ischemia or scar.   He was seen by Gordon Millin NP in 04/2015 for palpitations. Subsequent heart monitor showed NSR with PACs and no afib. 2D ECHO showed normal EF with moderate LVH.   Over the last 6 weeks he has been trying to eat better and cut out soda. Last Friday, he had a little too much to drink. On Saturday, he was walking up the stairs and felt chest soreness that resolved with rest. On Sunday, he was at the super market and got chest soreness while rushing around the grocery store. Yesterday he got chest soreness at rest after eating pork chops and ice cream. No associated SOB but did get diaphoresis. No LE edema, orthopnea or PND. No dizziness or syncope. No blood in his stool or urine. Currently chest pain free and wants to go home as he has a lot going on this week for work.    Past Medical History:   Diagnosis Date  . ALLERGIC RHINITIS 01/25/2010  . Hx of cardiovascular stress test    a. Lexiscan Myoview 02/06/13:  EF 59%, no ischemia or scar  . Hx of echocardiogram    a.  Echo 02/03/13:  Mild LVH, mild focal basal septal hypertrophy, EF 50-55%, Gr 1 DD, mild LAE  . HYPERTENSION 01/25/2010  . HYPERTHYROIDISM 01/25/2010  . Impaired glucose tolerance   . Migraines   . Obesity   . OSA (obstructive sleep apnea)    Uses CPAP nightly  . Pericarditis 1980's  . 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  Marland Kitchen Vitiligo      Past Surgical History:  Procedure Laterality Date  . HERNIA REPAIR     umbilical  . INGUINAL HERNIA REPAIR    . LUMBAR LAMINECTOMY      Allergies  Allergen Reactions  . Flomax [Tamsulosin Hcl] Itching  . Relpax [Eletriptan]     Chest pain, hypertension, sweating    I have reviewed the patient's current medications . [START ON 02/21/2017] aspirin EC  81 mg Oral Daily  . doxazosin  2 mg Oral QHS  . heparin  5,000 Units Subcutaneous Q8H  . losartan  100 mg Oral Daily  . metoprolol succinate  100 mg Oral Daily  . pravastatin  40 mg Oral Daily  . sodium chloride flush  3 mL Intravenous  Q12H   . sodium chloride     sodium chloride, acetaminophen **OR** acetaminophen, gi cocktail, morphine injection, nitroGLYCERIN, ondansetron **OR** ondansetron (ZOFRAN) IV, sodium chloride flush  Prior to Admission medications   Medication Sig Start Date End Date Taking? Authorizing Provider  aspirin EC 81 MG tablet Take 1 tablet (81 mg total) by mouth daily. Patient taking differently: Take 81-325 mg by mouth daily.  02/03/13  Yes Barton Dubois, MD  doxazosin (CARDURA) 2 MG tablet Take 1 tablet (2 mg total) by mouth at bedtime. 04/17/16  Yes Marletta Lor, MD  fluticasone [facility]) 50 MCG/ACT nasal spray Place 2 sprays into both nostrils as needed. Patient taking differently: Place 2 sprays into both nostrils daily as needed for allergies.  10/21/15 02/20/17  Yes Marletta Lor, MD  losartan (COZAAR) 100 MG tablet TAKE 1 TABLET DAILY 07/17/16  Yes Marletta Lor, MD  metoprolol succinate (TOPROL-XL) 100 MG 24 hr tablet TAKE 1 TABLET DAILY 07/17/16  Yes Marletta Lor, MD  pravastatin (PRAVACHOL) 40 MG tablet TAKE 1 TABLET DAILY 07/17/16  Yes Marletta Lor, MD  sildenafil (VIAGRA) 100 MG tablet Take 100 mg by mouth daily as needed for erectile dysfunction (erectile dysfunction).   Yes Historical Provider, MD  sodium chloride (OCEAN) 0.65 % SOLN nasal spray Place 1 spray into both nostrils daily as needed for congestion.   Yes Historical Provider, MD     Social History   Social History  . Marital status: Married    Spouse name: N/A  . Number of children: 2  . Years of education: N/A   Occupational History  . sales    Social History Main Topics  . Smoking status: Former Smoker    Years: 8.00    Quit date: 11/06/1966  . Smokeless tobacco: Never Used  . Alcohol use Yes     Comment: occasional - max 2x/week  . Drug use: No  . Sexual activity: Not on file   Other Topics Concern  . Not on file   Social History Narrative  . No narrative on file    Family Status  Relation Status  . Mother Alive  . Father Deceased at age 56   complications of CVA  . Sister Alive  . Brother Alive  . Brother Deceased  . Brother   . Brother    Family History  Problem Relation Age of Onset  . Stroke Father     Passed away in his 93s  . Thyroid disease Sister     goiter  . Sudden death Brother     ? Drug use, no autopsy  . Throat cancer Brother     ROS:  Full 14 point review of systems complete and found to be negative unless listed above.  Physical Exam: Blood pressure 139/84, pulse 74, temperature 97.7 F (36.5 C), temperature source Oral, resp. rate 18, height 6\' 2"  (1.88 m), weight 267 lb (121.1 kg), SpO2 97 %.  General: Well developed, well nourished, male in no acute distress, obese Head: Eyes PERRLA, No xanthomas.    Normocephalic and atraumatic, oropharynx without edema or exudate.   Lungs: CTAB Heart: HRRR S1 S2, no rub/gallop, Heart regular rate and rhythm with S1, S2 no murmur. pulses are 2+ extrem.    Neck: No carotid bruits. No lymphadenopathy. no JVD. Abdomen: Bowel sounds present, abdomen soft and non-tender without masses or hernias noted. Msk:  No spine or cva tenderness. No weakness, no joint deformities or effusions. Extremities: No clubbing  or cyanosis. No LE edema.  Neuro: Alert and oriented X 3. No focal deficits noted. Psych:  Good affect, responds appropriately Skin: No rashes or lesions noted.  Labs:   Lab Results  Component Value Date   WBC 6.4 02/20/2017   HGB 13.3 02/20/2017   HCT 40.2 02/20/2017   MCV 90.5 02/20/2017   PLT 125 (L) 02/20/2017   No results for input(s): INR in the last 72 hours.  Recent Labs Lab 02/20/17 0347  NA 139  K 3.9  CL 105  CO2 27  BUN 20  CREATININE 1.08  CALCIUM 9.6  GLUCOSE 123*   No results found for: MG  Recent Labs  02/20/17 0354 02/20/17 1100  TROPONINI 0.07* 0.07*    Recent Labs  02/20/17 0358  TROPIPOC 0.02   Pro B Natriuretic peptide (BNP)  Date/Time Value Ref Range Status  02/02/2013 11:26 PM 112.7 0 - 125 pg/mL Final   Lab Results  Component Value Date   CHOL 144 03/20/2016   HDL 41.70 03/20/2016   LDLCALC 95 03/20/2016   TRIG 40.0 03/20/2016   No results found for: DDIMER No results found for: LIPASE, AMYLASE TSH  Date/Time Value Ref Range Status  03/20/2016 03:50 PM 0.65 0.35 - 4.50 uIU/mL Final   Retic %  Date/Time Value Ref Range Status  02/22/2015 12:27 PM 1.58 0.80 - 1.80 % Final    Echo: 03/05/2015 LV EF: 60% -   65% Study Conclusions - Left ventricle: The cavity size was normal. Wall thickness was   increased in a pattern of moderate LVH. Systolic function was   normal. The estimated ejection fraction was in the range of 60%   to 65%. Wall motion was normal; there were no regional wall    motion abnormalities. Doppler parameters are consistent with   abnormal left ventricular relaxation (grade 1 diastolic   dysfunction).   ECG: NSR with first degree AV block HR 77 - personally reviewed  TELE: NSR with 1st deg AV block - personally reviewed  Radiology:  Dg Chest 2 View  Result Date: 02/20/2017 CLINICAL DATA:  Mid chest pain with pain down the right arm since yesterday. EXAM: CHEST  2 VIEW COMPARISON:  02/02/2013 FINDINGS: Normal heart size and pulmonary vascularity. Linear atelectasis or fibrosis in the lung bases similar to prior study. No focal consolidation or airspace disease. No blunting of costophrenic angles. No pneumothorax. Mediastinal contours appear intact. IMPRESSION: No active cardiopulmonary disease. Electronically Signed   By: Lucienne Capers M.D.   On: 02/20/2017 04:12    ASSESSMENT AND PLAN:    Active Problems:   Essential hypertension   Morbid obesity (HCC)   Chest pain, rule out acute myocardial infarction   HLD (hyperlipidemia)   History of CVA (cerebrovascular accident)  Bently Wyss is a 71 y.o. male with a history of HTN, OSA, MUGUS, CVA by imaging, remote pericarditis and migraine HAs who presented to [facility] on 02/20/17 with chest pain.   Chest pain with minimally elevated troponin: troponin 0.7--> 0.7. Our recommendation was for inpatient heart catheterization either today or tomorrow; however, patient is very reluctant. He has a lot going on this week and has out of town guests that he feels he cannot cancel on. Risks and benefits of both stress testing and heart catheterization were explained in great detail and patient was very clear that he would to proceed with stress testing. He also understands that if stress test returns high risk that he will need to come back  for coronary angiography. He is currently chest pain free and "doesn't think he has a heart problem." I have arranged for a nuclear stress test this Thursday at 9:45 am and follow up with  me in 2 weeks  HTN: BP with moderate control.   HLD: LDL 95 in 03/2016. Continue pravastatin 40mg  daily.   CVA: noted incidentally on imaging. Continue ASA and statin   MGUS with mild thrombocytopenia: followed by Dr. Burr Medico   Signed: Angelena Form, PA-C 02/20/2017 1:12 PM  Pager [phone]  Co-Sign MD  I have seen and examined the patient along with Angelena Form, PA-C.  I have reviewed the chart, notes and new data.  I agree with PA's note.  Key new complaints: he tries to minimize his symptoms and explain them away with noncardiac causes, but I am worried he has new onset exertional angina and maybe one episode of angina at rest Key examination changes: vitiligo, normal CV exam Key new findings / data: minimal troponin abnormality, plateau. Low risk ECG  PLAN: Spent a very long time discussing the pros and cons of noninvasive versus catheter based imaging for further evaluation. He may be experiencing unstable angina and I recommended inpatient coronary angiography. He insists on outpatient evaluation and wants a less invasive approach: "because I think nothing is wrong". Also points out the inconvenient time of these problems since he is expecting out of town guests. He is on ASA, statin and beta blocker. Would send home with SL NTG. I instructed him to come to ED immediately for chest discomfort lasting >20 minutes, not relieved by 3 consecutive SL NTG.  Sanda Klein, MD, [place] [phone] 02/20/2017, 2:38 PM

## 2017-02-21 ENCOUNTER — Telehealth (HOSPITAL_COMMUNITY): Payer: Self-pay | Admitting: *Deleted

## 2017-02-21 NOTE — Telephone Encounter (Signed)
Patient given detailed instructions per Myocardial Perfusion Study Information Sheet for the test on 02/22/17 at 9:45. Patient notified to arrive 15 minutes early and that it is imperative to arrive on time for appointment to keep from having the test rescheduled.  If you need to cancel or reschedule your appointment, please call the office within 24 hours of your appointment. Failure to do so may result in a cancellation of your appointment, and a $50 no show fee. Patient verbalized understanding.Gordon Phillips

## 2017-02-22 ENCOUNTER — Encounter (INDEPENDENT_AMBULATORY_CARE_PROVIDER_SITE_OTHER): Payer: Self-pay

## 2017-02-22 ENCOUNTER — Ambulatory Visit (HOSPITAL_COMMUNITY): Payer: Medicare Other | Attending: Cardiology

## 2017-02-22 DIAGNOSIS — I10 Essential (primary) hypertension: Secondary | ICD-10-CM | POA: Diagnosis not present

## 2017-02-22 DIAGNOSIS — R079 Chest pain, unspecified: Secondary | ICD-10-CM

## 2017-02-22 DIAGNOSIS — Z8673 Personal history of transient ischemic attack (TIA), and cerebral infarction without residual deficits: Secondary | ICD-10-CM | POA: Diagnosis not present

## 2017-02-22 LAB — MYOCARDIAL PERFUSION IMAGING
LV dias vol: 136 mL (ref 62–150)
LV sys vol: 68 mL
Peak HR: 87 {beats}/min
RATE: 0.29
Rest HR: 65 {beats}/min
SDS: 1
SRS: 1
SSS: 2
TID: 0.97

## 2017-02-22 MED ORDER — REGADENOSON 0.4 MG/5ML IV SOLN
0.4000 mg | Freq: Once | INTRAVENOUS | Status: AC
  Administered 2017-02-22: 0.4 mg via INTRAVENOUS

## 2017-02-22 MED ORDER — TECHNETIUM TC 99M TETROFOSMIN IV KIT
11.0000 | PACK | Freq: Once | INTRAVENOUS | Status: AC | PRN
  Administered 2017-02-22: 11 via INTRAVENOUS
  Filled 2017-02-22: qty 11

## 2017-02-22 MED ORDER — TECHNETIUM TC 99M TETROFOSMIN IV KIT
32.5000 | PACK | Freq: Once | INTRAVENOUS | Status: AC | PRN
  Administered 2017-02-22: 32.5 via INTRAVENOUS
  Filled 2017-02-22: qty 33

## 2017-03-02 ENCOUNTER — Encounter: Payer: Self-pay | Admitting: Physician Assistant

## 2017-03-06 ENCOUNTER — Ambulatory Visit (INDEPENDENT_AMBULATORY_CARE_PROVIDER_SITE_OTHER): Payer: Medicare Other | Admitting: Physician Assistant

## 2017-03-06 ENCOUNTER — Ambulatory Visit: Payer: Medicare Other | Admitting: Physician Assistant

## 2017-03-06 ENCOUNTER — Encounter: Payer: Self-pay | Admitting: Physician Assistant

## 2017-03-06 ENCOUNTER — Encounter (INDEPENDENT_AMBULATORY_CARE_PROVIDER_SITE_OTHER): Payer: Self-pay

## 2017-03-06 VITALS — BP 122/80 | HR 65 | Ht 74.0 in | Wt 266.0 lb

## 2017-03-06 DIAGNOSIS — Z8673 Personal history of transient ischemic attack (TIA), and cerebral infarction without residual deficits: Secondary | ICD-10-CM | POA: Diagnosis not present

## 2017-03-06 DIAGNOSIS — I10 Essential (primary) hypertension: Secondary | ICD-10-CM

## 2017-03-06 DIAGNOSIS — E7849 Other hyperlipidemia: Secondary | ICD-10-CM

## 2017-03-06 DIAGNOSIS — R079 Chest pain, unspecified: Secondary | ICD-10-CM | POA: Diagnosis not present

## 2017-03-06 DIAGNOSIS — E784 Other hyperlipidemia: Secondary | ICD-10-CM | POA: Diagnosis not present

## 2017-03-06 DIAGNOSIS — D472 Monoclonal gammopathy: Secondary | ICD-10-CM

## 2017-03-06 NOTE — Progress Notes (Signed)
Cardiology Office Note    Date:  03/06/2017   ID:  Gordon Phillips, DOB 12-18-1945, MRN [id number]  PCP:  Nyoka Cowden, MD  Cardiologist:  Dr. Aundra Dubin Dr. Sallyanne Kuster  CC: post hospital follow up.   History of Present Illness:  Gordon Phillips is a 71 y.o. male with a history of HTN, OSA, MUGUS, CVA by imaging, remote pericarditis and migraine HAs who presents to clinic for post hospital follow up.   He presented with CP back in 2014 shortly after taking the Relpax (triptan). BP was 240/140. Of note, he had an old lacunar infarct in L basal ganglia on Head CT (patient was unaware of prior stroke). Echo 02/03/13: Mild LVH, mild focal basal septal hypertrophy, EF 50-55%, Gr 1 DD, mild LAE. CEs remained negative. He was placed on a statin. In light of his remote stroke on CT and migraine HAs, OP neurology consultation was recommended. OP myoview was recommended as well. Lexiscan Myoview 02/06/13: EF 59%, no ischemia or scar.   He was seen by Marcellina Millin NP in 04/2015 for palpitations. Subsequent heart monitor showed NSR with PACs and no afib. 2D ECHO showed normal EF with moderate LVH.  He was admitted for one day on 02/20/17 for chest pain and mildly elevated troponin: 0.7--> 0.7. Our recommendation was for inpatient heart catheterization; however, patient was very reluctant. He had a lot going on that week with out of town guests that he could not cancel on. Risks and benefits of both stress testing and heart catheterization were explained in great detail and patient was very clear that he would to proceed with stress testing. He was discharged home with outpatient nuclear stress test 02/22/17 that was overall low risk but showed EF 50%.  Today he presents to clinic for follow up. He has not had any chest pain. He continues to have some low back pain and hip. 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 No exertional symptoms. No LE edema, orthopnea or PND. No  dizziness or syncope. No palpitations. He is feeling great.    Past Medical History:  Diagnosis Date  . ALLERGIC RHINITIS 01/25/2010  . Arthritis    "neck, back, shoulders," (02/20/2017)  . Chronic lower back pain   . High cholesterol   . Hx of cardiovascular stress test    a. Lexiscan Myoview 02/06/13:  EF 59%, no ischemia or scar  . Hx of echocardiogram    a.  Echo 02/03/13:  Mild LVH, mild focal basal septal hypertrophy, EF 50-55%, Gr 1 DD, mild LAE  . HYPERTENSION 01/25/2010  . HYPERTHYROIDISM 01/25/2010   S/P radioactive iodine "back in the 1980s" (02/20/2017)  . Impaired glucose tolerance   . 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 Obesity   . OSA on CPAP    Uses CPAP nightly  . Pericarditis 1980's  . Stroke [facility])    Old L basal ganglia infarct by CT 01/2013.  Marland Kitchen Vitiligo     Past Surgical History:  Procedure Laterality Date  . BACK SURGERY    . INGUINAL HERNIA REPAIR Bilateral   . LUMBAR LAMINECTOMY    . UMBILICAL HERNIA REPAIR      Current Medications: Outpatient Medications Prior to Visit  Medication Sig Dispense Refill  . doxazosin (CARDURA) 2 MG tablet Take 1 tablet (2 mg total) by mouth at bedtime. 90 tablet 3  . losartan (COZAAR) 100 MG tablet TAKE 1 TABLET  DAILY 90 tablet 3  . metoprolol succinate (TOPROL-XL) 100 MG 24 hr tablet TAKE 1 TABLET DAILY 90 tablet 3  . nitroGLYCERIN (NITROSTAT) 0.4 MG SL tablet Place 1 tablet (0.4 mg total) under the tongue every 5 (five) minutes as needed for chest pain. 30 tablet 12  . pravastatin (PRAVACHOL) 40 MG tablet TAKE 1 TABLET DAILY 90 tablet 3  . sildenafil (VIAGRA) 100 MG tablet Take 100 mg by mouth daily as needed for erectile dysfunction (erectile dysfunction).    . sodium chloride (OCEAN) 0.65 % SOLN nasal spray Place 1 spray into both nostrils daily as needed for congestion.    Marland Kitchen aspirin EC 81 MG tablet Take 1 tablet (81 mg total) by mouth daily. (Patient taking differently: Take  81-325 mg by mouth daily. )    . fluticasone (FLONASE) 50 MCG/ACT nasal spray Place 2 sprays into both nostrils as needed. (Patient taking differently: Place 2 sprays into both nostrils daily as needed for allergies. ) 16 g 2   No facility-administered medications prior to visit.      Allergies:   Flomax [tamsulosin hcl] and Relpax [eletriptan]   Social History   Social History  . Marital status: Married    Spouse name: N/A  . Number of children: 2  . Years of education: N/A   Occupational History  . sales    Social History Main Topics  . Smoking status: Former Smoker    Packs/day: 0.50    Years: 8.00    Types: Cigarettes    Quit date: 1968  . Smokeless tobacco: Never Used  . Alcohol use Yes     Comment: 02/20/2017 "couple drinks/month on average"  . Drug use: No  . Sexual activity: Yes   Other Topics Concern  . None   Social History Narrative  . None     Family History:  The patient's family history includes Stroke in his father; Sudden death in his brother; Throat cancer in his brother; Thyroid disease in his sister.      ROS:   Please see the history of present illness.    ROS All other systems reviewed and are negative.   PHYSICAL EXAM:   VS:  BP 122/80   Pulse 65   Ht 6\' 2"  (1.88 m)   Wt 266 lb (120.7 kg)   SpO2 96%   BMI 34.15 kg/m    GEN: Well nourished, well developed, in no acute distress, vitaligo HEENT: normal  Neck: no JVD, carotid bruits, or masses Cardiac: RRR; no murmurs, rubs, or gallops,no edema  Respiratory:  clear to auscultation bilaterally, normal work of breathing GI: soft, nontender, nondistended, + BS MS: no deformity or atrophy  Skin: warm and dry, no rash Neuro:  Alert and Oriented x 3, Strength and sensation are intact Psych: euthymic mood, full affect    Wt Readings from Last 3 Encounters:  03/06/17 266 lb (120.7 kg)  02/22/17 267 lb (121.1 kg)  02/20/17 267 lb (121.1 kg)      Studies/Labs Reviewed:   EKG:  EKG is  NOT ordered today.    Recent Labs: 03/20/2016: TSH 0.65 01/16/2017: ALT 12 02/20/2017: BUN 20; Creatinine, Ser 1.08; Hemoglobin 13.3; Platelets 125; Potassium 3.9; Sodium 139   Lipid Panel    Component Value Date/Time   CHOL 144 03/20/2016 1550   TRIG 40.0 03/20/2016 1550   HDL 41.70 03/20/2016 1550   CHOLHDL 3 03/20/2016 1550   VLDL 8.0 03/20/2016 1550   LDLCALC 95 03/20/2016 1550  LDLDIRECT 139.2 11/21/2012 1122    Additional studies/ records that were reviewed today include:  Echo: 03/05/2015 LV EF: 60% - 65% Study Conclusions - Left ventricle: The cavity size was normal. Wall thickness was increased in a pattern of moderate LVH. Systolic function was normal. The estimated ejection fraction was in the range of 60% to 65%. Wall motion was normal; there were no regional wall motion abnormalities. Doppler parameters are consistent with abnormal left ventricular relaxation (grade 1 diastolic dysfunction).  Myoview 02/22/17 Study Highlights   Nuclear stress EF: 50%.  There was no ST segment deviation noted during stress.  The study is normal.  This is a low risk study.  The left ventricular ejection fraction is mildly decreased (45-54%).    ASSESSMENT & PLAN:   Chest pain: this has resolved. He is feeling great. Recent myoview was low risk. He will follow with [place] PRN and call back if he has any return of chest pain.   HTN: BP well controlled today   HLD: continue statin   Hx of CVA: continue ASA and statin   MGUS with thrombocytopenia: followed by Dr. Burr Medico   Medication Adjustments/Labs and Tests Ordered: Current medicines are reviewed at length with the patient today.  Concerns regarding medicines are outlined above.  Medication changes, Labs and Tests ordered today are listed in the Patient Instructions below. Patient Instructions  Medication Instructions:  Your physician recommends that you continue on your current medications as directed.  Please refer to the Current Medication list given to you today.  Labwork: None ordered  Testing/Procedures: None ordered  Follow-Up: Your physician recommends that you schedule a follow-up appointment in: AS NEEDED   Any Other Special Instructions Will Be Listed Below (If Applicable).     If you need a refill on your cardiac medications before your next appointment, please call your pharmacy.      Signed, Angelena Form, PA-C  03/06/2017 3:51 PM    [facility] [address], [place], [place]  [address] Phone: [phone]; Fax: [phone]

## 2017-03-06 NOTE — Patient Instructions (Addendum)
Medication Instructions:  Your physician recommends that you continue on your current medications as directed. Please refer to the Current Medication list given to you today.   Labwork: None ordered  Testing/Procedures: None ordered  Follow-Up: Your physician recommends that you schedule a follow-up appointment in: AS NEEDED   Any Other Special Instructions Will Be Listed Below (If Applicable).     If you need a refill on your cardiac medications before your next appointment, please call your pharmacy.   

## 2017-03-27 ENCOUNTER — Other Ambulatory Visit: Payer: Self-pay | Admitting: Internal Medicine

## 2017-05-31 ENCOUNTER — Other Ambulatory Visit: Payer: Self-pay | Admitting: *Deleted

## 2017-05-31 ENCOUNTER — Ambulatory Visit (INDEPENDENT_AMBULATORY_CARE_PROVIDER_SITE_OTHER): Payer: Medicare Other | Admitting: Family Medicine

## 2017-05-31 ENCOUNTER — Encounter: Payer: Self-pay | Admitting: Family Medicine

## 2017-05-31 VITALS — BP 108/60 | HR 76 | Temp 98.2°F | Ht 74.0 in | Wt 266.0 lb

## 2017-05-31 DIAGNOSIS — M79674 Pain in right toe(s): Secondary | ICD-10-CM | POA: Diagnosis not present

## 2017-05-31 MED ORDER — CEPHALEXIN 500 MG PO CAPS: 500.0000 mg | ORAL_CAPSULE | Freq: Two times a day (BID) | ORAL | 0 refills | Status: DC

## 2017-05-31 NOTE — Patient Instructions (Signed)
Aleve or ibuprofen twice daily.   Take the antibiotic as instructed.  Follow up in about 1 week.  Seek care sooner if worsening or new concerns.

## 2017-05-31 NOTE — Progress Notes (Signed)
HPI:  Acute visit for toe pain: -R great toe -started when woke up -no injury, fever, malaise, weakness or numbness -no hx gout, reports does have arthritis and has been driving a lot  ROS: See pertinent positives and negatives per HPI.  Past Medical History:  Diagnosis Date  . ALLERGIC RHINITIS 01/25/2010  . Arthritis    "neck, back, shoulders," (02/20/2017)  . Chronic lower back pain   . High cholesterol   . Hx of cardiovascular stress test    a. Lexiscan Myoview 02/06/13:  EF 59%, no ischemia or scar  . Hx of echocardiogram    a.  Echo 02/03/13:  Mild LVH, mild focal basal septal hypertrophy, EF 50-55%, Gr 1 DD, mild LAE  . HYPERTENSION 01/25/2010  . HYPERTHYROIDISM 01/25/2010   S/P radioactive iodine "back in the 1980s" (02/20/2017)  . Impaired glucose tolerance   . 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 Obesity   . OSA on CPAP    Uses CPAP nightly  . Pericarditis 1980's  . 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  Marland Kitchen Vitiligo     Past Surgical History:  Procedure Laterality Date  . BACK SURGERY    . INGUINAL HERNIA REPAIR Bilateral   . LUMBAR LAMINECTOMY    . UMBILICAL HERNIA REPAIR      Family History  Problem Relation Age of Onset  . Stroke Father        Passed away in his 45s  . Thyroid disease Sister        goiter  . Sudden death Brother        ? Drug use, no autopsy  . Throat cancer Brother     Social History   Social History  . Marital status: Married    Spouse name: N/A  . Number of children: 2  . Years of education: N/A   Occupational History  . sales    Social History Main Topics  . Smoking status: Former Smoker    Packs/day: 0.50    Years: 8.00    Types: Cigarettes    Quit date: 1968  . Smokeless tobacco: Never Used  . Alcohol use Yes     Comment: 02/20/2017 "couple drinks/month on average"  . Drug use: No  . Sexual activity: Yes   Other Topics Concern  . None   Social  History Narrative  . None     Current Outpatient Prescriptions:  .  aspirin EC 81 MG tablet, Take 81 mg by mouth daily., Disp: , Rfl:  .  doxazosin (CARDURA) 2 MG tablet, TAKE 1 TABLET AT BEDTIME, Disp: 90 tablet, Rfl: 3 .  fluticasone (FLONASE) 50 MCG/ACT nasal spray, Place 2 sprays into both nostrils as needed for allergies or rhinitis., Disp: , Rfl:  .  losartan (COZAAR) 100 MG tablet, TAKE 1 TABLET DAILY, Disp: 90 tablet, Rfl: 3 .  metoprolol succinate (TOPROL-XL) 100 MG 24 hr tablet, TAKE 1 TABLET DAILY, Disp: 90 tablet, Rfl: 3 .  nitroGLYCERIN (NITROSTAT) 0.4 MG SL tablet, Place 1 tablet (0.4 mg total) under the tongue every 5 (five) minutes as needed for chest pain., Disp: 30 tablet, Rfl: 12 .  pravastatin (PRAVACHOL) 40 MG tablet, TAKE 1 TABLET DAILY, Disp: 90 tablet, Rfl: 3 .  sildenafil (VIAGRA) 100 MG tablet, Take 100 mg by mouth daily as needed for erectile dysfunction (erectile dysfunction)., Disp: , Rfl:  .  sodium chloride (OCEAN) 0.65 %  SOLN nasal spray, Place 1 spray into both nostrils daily as needed for congestion., Disp: , Rfl:  .  cephALEXin (KEFLEX) 500 MG capsule, Take 1 capsule (500 mg total) by mouth 2 (two) times daily., Disp: 10 capsule, Rfl: 0  EXAM:  Vitals:   05/31/17 1642  BP: 108/60  Pulse: 76  Temp: 98.2 F (36.8 C)    Body mass index is 34.15 kg/m.  GENERAL: vitals reviewed and listed above, alert, oriented, appears well hydrated and in no acute distress  HEENT: atraumatic, conjunttiva clear, no obvious abnormalities on inspection of external nose and ears  NECK: no obvious masses on inspection  MS: moves all extremities without noticeable abnormality, swelling, erythema and TTP over IP jt 1st digit R foot - normal strength and moevment in toes  SKIN: erythema, warmth and edema dorsal portion of R great toe above the IP joint - no edema or redness around nail or breaks in the skin  PSYCH: pleasant and cooperative, no obvious depression or  anxiety  ASSESSMENT AND PLAN:  Discussed the following assessment and plan:  Pain of toe of right foot  -we discussed possible serious and likely etiologies, workup and treatment, treatment risks and return precautions; he is going out of town as his mother is very sick -after this discussion, Kidus opted for short course aleve in case gout or OA (understands risks/interaction - but reports tolerates well), keflex in case cellulitis - felt less likely -follow up advised next wee -of course, we advised Emon  to return or notify a doctor immediately if symptoms worsen or persist or new concerns arise - advised to go to [facility] if out of town and worsening.   Patient Instructions  Aleve or ibuprofen twice daily.   Take the antibiotic as instructed.  Follow up in about 1 week.  Seek care sooner if worsening or new concerns.    Colin Benton R., DO

## 2017-05-31 NOTE — Telephone Encounter (Signed)
Rx done. 

## 2017-07-17 ENCOUNTER — Other Ambulatory Visit: Payer: Medicare Other

## 2017-07-23 ENCOUNTER — Encounter: Payer: Medicare Other | Admitting: Hematology

## 2017-07-23 ENCOUNTER — Other Ambulatory Visit: Payer: Self-pay | Admitting: Internal Medicine

## 2017-07-23 ENCOUNTER — Telehealth: Payer: Self-pay | Admitting: *Deleted

## 2017-07-23 NOTE — Telephone Encounter (Signed)
Tried unsuccessfully to reach pt to set up a reschedule for a no show.  V/M not set up on mobile # and no answer at home phone #.

## 2017-07-24 NOTE — Progress Notes (Signed)
This encounter was created in error - please disregard.

## 2017-08-10 ENCOUNTER — Telehealth: Payer: Self-pay | Admitting: Hematology

## 2017-08-10 NOTE — Telephone Encounter (Signed)
Spoke with patient and rescheduled appt due to their availabilty

## 2017-08-13 ENCOUNTER — Ambulatory Visit (HOSPITAL_BASED_OUTPATIENT_CLINIC_OR_DEPARTMENT_OTHER): Payer: Medicare Other | Admitting: Hematology

## 2017-08-13 ENCOUNTER — Other Ambulatory Visit (HOSPITAL_BASED_OUTPATIENT_CLINIC_OR_DEPARTMENT_OTHER): Payer: Medicare Other

## 2017-08-13 ENCOUNTER — Encounter: Payer: Self-pay | Admitting: Hematology

## 2017-08-13 ENCOUNTER — Telehealth: Payer: Self-pay | Admitting: Hematology

## 2017-08-13 VITALS — BP 142/63 | HR 64 | Temp 98.0°F | Resp 18 | Ht 74.0 in | Wt 264.3 lb

## 2017-08-13 DIAGNOSIS — D472 Monoclonal gammopathy: Secondary | ICD-10-CM

## 2017-08-13 DIAGNOSIS — D696 Thrombocytopenia, unspecified: Secondary | ICD-10-CM

## 2017-08-13 DIAGNOSIS — G8929 Other chronic pain: Secondary | ICD-10-CM | POA: Diagnosis not present

## 2017-08-13 DIAGNOSIS — R5383 Other fatigue: Secondary | ICD-10-CM | POA: Diagnosis not present

## 2017-08-13 DIAGNOSIS — M545 Low back pain: Secondary | ICD-10-CM

## 2017-08-13 DIAGNOSIS — I10 Essential (primary) hypertension: Secondary | ICD-10-CM

## 2017-08-13 LAB — COMPREHENSIVE METABOLIC PANEL
ALT: 13 U/L (ref 0–55)
AST: 19 U/L (ref 5–34)
Albumin: 3.8 g/dL (ref 3.5–5.0)
Alkaline Phosphatase: 61 U/L (ref 40–150)
Anion Gap: 7 mEq/L (ref 3–11)
BUN: 15.6 mg/dL (ref 7.0–26.0)
CO2: 28 mEq/L (ref 22–29)
Calcium: 9.5 mg/dL (ref 8.4–10.4)
Chloride: 108 mEq/L (ref 98–109)
Creatinine: 1.3 mg/dL (ref 0.7–1.3)
EGFR: 67 mL/min/{1.73_m2} — ABNORMAL LOW (ref >90)
Glucose: 95 mg/dl (ref 70–140)
Potassium: 3.9 mEq/L (ref 3.5–5.1)
Sodium: 142 mEq/L (ref 136–145)
Total Bilirubin: 0.62 mg/dL (ref 0.20–1.20)
Total Protein: 7.5 g/dL (ref 6.4–8.3)

## 2017-08-13 LAB — CBC WITH DIFFERENTIAL/PLATELET
BASO%: 0.6 % (ref 0.0–2.0)
Basophils Absolute: 0 10*3/uL (ref 0.0–0.1)
EOS%: 3 % (ref 0.0–7.0)
Eosinophils Absolute: 0.2 10*3/uL (ref 0.0–0.5)
HCT: 40.8 % (ref 38.4–49.9)
HGB: 13.6 g/dL (ref 13.0–17.1)
LYMPH%: 32.6 % (ref 14.0–49.0)
MCH: 30.8 pg (ref 27.2–33.4)
MCHC: 33.2 g/dL (ref 32.0–36.0)
MCV: 92.6 fL (ref 79.3–98.0)
MONO#: 0.4 10*3/uL (ref 0.1–0.9)
MONO%: 6.5 % (ref 0.0–14.0)
NEUT#: 3.1 10*3/uL (ref 1.5–6.5)
NEUT%: 57.3 % (ref 39.0–75.0)
Platelets: 138 10*3/uL — ABNORMAL LOW (ref 140–400)
RBC: 4.4 10*6/uL (ref 4.20–5.82)
RDW: 14.4 % (ref 11.0–14.6)
WBC: 5.4 10*3/uL (ref 4.0–10.3)
lymph#: 1.8 10*3/uL (ref 0.9–3.3)

## 2017-08-13 NOTE — Telephone Encounter (Signed)
Gave avs and calendar for April and October 2019

## 2017-08-13 NOTE — Progress Notes (Signed)
[facility]  Telephone:([phone]) [phone] Fax:([phone]) [phone]  Clinic follow Up Note   Patient Care Team: Marletta Lor, MD as PCP - General 08/13/2017  CHIEF COMPLAINTS:  Follow up MGUS  HISTORY OF PRESENTING ILLNESS:  Gordon Phillips 71 y.o. male is here because of abnormal SPEP and suspicious bone lesion on the CT scan.  He had a fall after he tripped and his face hit a wall on the super bowl night on 12/14/2014. He had a significant bleeding on the face and neck pain and was brought to emergency room for further evaluation by 911. CT scan of head and the neck was obtained which showed soft tissue laceration overlying the right orbit, no fracture. The skin incidentally found scattered small lucencies within the visualized osseous structures, with a 6 mm lucency noticed at the C6. He subsequently follow-up with his primary care physician, who ordered the SPEP which showed a restricted band consistent with monoclonal protein, 0.27 g/dl, the quantitative immunoglobulin IgG, IgA and IgM were within normal limits. He developed neck pain which radiate to bilateral shoulders after the accident, this was further evaluated by cervical and lumbar MRI without contrast. The MRI showed diffuse degenerative changes. The low-density lesion in the C5 vertebral body on the CT wasn't not seen in the MRI. His neck pain radiated to b/l shoulders has resolved now.   He had L4-5 disck back surgery 15 years ago in [place], and he has some chronic back pain since then. It has been getting worse in the past few years. He takes Percocet as needed for the back pain. He otherwise feels well, denies any other pain. He works full-time, and remains to be physically active. He denies any fever, chills, or night sweats. No recent weight loss.   CURRENT TREATMENT: Observation  INTERIM HISTORY:  Gordon Phillips returns for follow-up. He was last seen by me 6 months ago. He presents to the clinic today reporting his  mother passed away in [place] so he was taking care of her and missed some of his appointments.  He does note having chest pain over the past 6 months and it was due to having gas. He denies any heart problems. He denies any more chest pain. His back pain is still present but stable. He reports his wife was in the ER last night and this morning due to her DM. Her husband thinks it is her having a nervous breakdown. Overall he has a positive outlook. He is overall doing well.    MEDICAL HISTORY:  Past Medical History:  Diagnosis Date  . ALLERGIC RHINITIS 01/25/2010  . Arthritis    "neck, back, shoulders," (02/20/2017)  . Chronic lower back pain   . High cholesterol   . Hx of cardiovascular stress test    a. Lexiscan Myoview 02/06/13:  EF 59%, no ischemia or scar  . Hx of echocardiogram    a.  Echo 02/03/13:  Mild LVH, mild focal basal septal hypertrophy, EF 50-55%, Gr 1 DD, mild LAE  . HYPERTENSION 01/25/2010  . HYPERTHYROIDISM 01/25/2010   S/P radioactive iodine "back in the 1980s" (02/20/2017)  . Impaired glucose tolerance   . 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 Obesity   . OSA on CPAP    Uses CPAP nightly  . Pericarditis 1980's  . 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  Marland Kitchen Vitiligo     SURGICAL HISTORY: Past Surgical  History:  Procedure Laterality Date  . BACK SURGERY    . INGUINAL HERNIA REPAIR Bilateral   . LUMBAR LAMINECTOMY    . UMBILICAL HERNIA REPAIR      SOCIAL HISTORY: Social History   Social History  . Marital status: Married    Spouse name: N/A  . Number of children: 2  . Years of education: N/A   Occupational History  . sales    Social History Main Topics  . Smoking status: Former Smoker    Packs/day: 0.50    Years: 8.00    Types: Cigarettes    Quit date: 1968  . Smokeless tobacco: Never Used  . Alcohol use Yes     Comment: 02/20/2017 "couple drinks/month on average"  . Drug use: No  . Sexual  activity: Yes   Other Topics Concern  . Not on file   Social History Narrative  . No narrative on file    FAMILY HISTORY: Family History  Problem Relation Age of Onset  . Stroke Father        Passed away in his 81s  . Thyroid disease Sister        goiter  . Sudden death Brother        ? Drug use, no autopsy  . Throat cancer Brother     ALLERGIES:  is allergic to flomax [tamsulosin hcl] and relpax [eletriptan].  MEDICATIONS:  Current Outpatient Prescriptions  Medication Sig Dispense Refill  . aspirin EC 81 MG tablet Take 81 mg by mouth daily.    Marland Kitchen doxazosin (CARDURA) 2 MG tablet TAKE 1 TABLET AT BEDTIME 90 tablet 3  . fluticasone (FLONASE) 50 MCG/ACT nasal spray Place 2 sprays into both nostrils as needed for allergies or rhinitis.    Marland Kitchen losartan (COZAAR) 100 MG tablet TAKE 1 TABLET DAILY 90 tablet 3  . metoprolol succinate (TOPROL-XL) 100 MG 24 hr tablet TAKE 1 TABLET DAILY 90 tablet 3  . nitroGLYCERIN (NITROSTAT) 0.4 MG SL tablet Place 1 tablet (0.4 mg total) under the tongue every 5 (five) minutes as needed for chest pain. 30 tablet 12  . pravastatin (PRAVACHOL) 40 MG tablet TAKE 1 TABLET DAILY 90 tablet 3  . sildenafil (VIAGRA) 100 MG tablet Take 100 mg by mouth daily as needed for erectile dysfunction (erectile dysfunction).    . sodium chloride (OCEAN) 0.65 % SOLN nasal spray Place 1 spray into both nostrils daily as needed for congestion.    . cephALEXin (KEFLEX) 500 MG capsule Take 1 capsule (500 mg total) by mouth 2 (two) times daily. (Patient not taking: Reported on 08/13/2017) 10 capsule 0   No current facility-administered medications for this visit.     REVIEW OF SYSTEMS:   Constitutional: Denies fevers, chills or abnormal night sweats Eyes: Denies blurriness of vision, double vision or watery eyes Ears, nose, mouth, throat, and face: Denies mucositis or sore throat Respiratory: Denies cough, dyspnea or wheezes Cardiovascular: Denies palpitation, chest  discomfort or lower extremity swelling Gastrointestinal:  Denies nausea, heartburn or change in bowel habits Skin: Denies abnormal skin rashes Lymphatics: Denies new lymphadenopathy or easy bruising Neurological:Denies numbness, tingling or new weaknesses Behavioral/Psych: Mood is stable, no new changes (+) stressed Musculoskeletal: (+) chronic back pain  All other systems were reviewed with the patient and are negative.  PHYSICAL EXAMINATION: ECOG PERFORMANCE STATUS: 1 - Symptomatic but completely ambulatory  Vitals:   08/13/17 1427  BP: (!) 142/63  Pulse: 64  Resp: 18  Temp: 98 F (36.7 C)  SpO2: 99%   Filed Weights   08/13/17 1427  Weight: 264 lb 4.8 oz (119.9 kg)     GENERAL:alert, no distress and comfortable SKIN: skin color, texture, turgor are normal, no rashes or significant lesions (+) vitiligo EYES: normal, conjunctiva are pink and non-injected, sclera clear OROPHARYNX:no exudate, no erythema and lips, buccal mucosa, and tongue normal  NECK: supple, thyroid normal size, non-tender, without nodularity LYMPH:  no palpable lymphadenopathy in the cervical, axillary or inguinal LUNGS: clear to auscultation and percussion with normal breathing effort HEART: regular rate & rhythm and no murmurs and no lower extremity edema ABDOMEN:abdomen soft, non-tender and normal bowel sounds Musculoskeletal:no cyanosis of digits and no clubbing  PSYCH: alert & oriented x 3 with fluent speech NEURO: no focal motor/sensory deficits  LABORATORY DATA:  I have reviewed the data as listed CBC Latest Ref Rng & Units 08/13/2017 02/20/2017 01/16/2017  WBC 4.0 - 10.3 10e3/uL 5.4 6.4 4.7  Hemoglobin 13.0 - 17.1 g/dL 13.6 13.3 13.6  Hematocrit 38.4 - 49.9 % 40.8 40.2 41.4  Platelets 140 - 400 10e3/uL 138(L) 125(L) 128(L)    CMP Latest Ref Rng & Units 08/13/2017 02/20/2017 01/16/2017  Glucose 70 - 140 mg/dl 95 123(H) 119  BUN 7.0 - 26.0 mg/dL 15.6 20 13.6  Creatinine 0.7 - 1.3 mg/dL 1.3 1.08  1.2  Sodium 136 - 145 mEq/L 142 139 141  Potassium 3.5 - 5.1 mEq/L 3.9 3.9 4.3  Chloride 101 - 111 mmol/L - 105 -  CO2 22 - 29 mEq/L _0 Calcium 8.4 - 10.4 mg/dL 9.5 9.6 9.7  Total Protein 6.4 - 8.3 g/dL 7.5 - 6.9  Total Bilirubin 0.20 - 1.20 mg/dL 0.62 - 0.91  Alkaline Phos 40 - 150 U/L 61 - 65  AST 5 - 34 U/L 19 - 18  ALT 0 - 55 U/L 13 - 12    SPEP/IFE M-protein  05/24/2015: 0.3 07/17/2016: 0.3 01/16/2017: 0.4 08/13/17: PENDING   Kappa, lamda light chain and ration 07/17/2016: 13.1, 11.5, 1.14  01/16/2017: 12.1, 9.8, 1.23  08/13/17: PENDING  PATHOLOGY REPORT  Bone Marrow, Aspirate,Biopsy, and Clot, iliac crest 02/08/2015 - HYPERCELLULAR BONE MARROW FOR AGE WITH TRILINEAGE HEMATOPOIESIS INCLUDING ABUNDANT MEGAKARYOCYTES. - 5% PLASMA CELLS. - SEE COMMENT. PERIPHERAL BLOOD: - NO SIGNIFICANT MORPHOLOGIC ABNORMALITIES. Diagnosis Note The bone marrow is hypercellular for age with trilineage hematopoiesis including abundant megakaryocytes, some of which display atypical features. The significance of latter finding is uncertain at this time and hence correlation with cytogenetic studies is recommended. The plasma cells represent 5% of all cells in the aspirate associated with kappa light chain excess. Given the relatively small number of plasma cells evaluated, the overall features are considered suggestive but not entirely definitive of plasma cell dyscrasia. Clinical and cytogenetic correlation is recommended. (BNS:ecj 02/10/2015)    RADIOGRAPHIC STUDIES: I have personally reviewed the radiological images as listed and agreed with the findings in the report.  Bone Survey 02/28/2016 IMPRESSION: Stable small lytic lesions within the calvarium. Previously seen lucencies in the cervical spine and left greater trochanter less apparent on today's study. No new lytic lesions  Mr Cervical Spine Wo Contrast 01/14/2015    IMPRESSION: Negative for cervical spine fracture  Moderate  cervical disc degeneration and spondylosis.  C5 vertebral lesion on the left on CT is difficult to visualize on MRI however no worrisome lesion is seen in this area.   Electronically Signed   By: Franchot Gallo M.D.   On: 01/14/2015 08:04  Mr Lumbar Spine Wo Contrast 01/14/2015    IMPRESSION: Extra foraminal disc contacts the exited left L2 root.  Moderate to moderately severe bilateral foraminal narrowing, worse on the right at L3-4 is due to disc with endplate spurring and facet degenerative disease.  Advanced facet arthropathy at L5-S1 results in 0.4 cm anterolisthesis. There is left much worse than right lateral recess narrowing at this level and moderate to moderately severe bilateral foraminal narrowing.  Moderate to moderately severe bilateral foraminal narrowing at L4-5 appears worse on the right. The central canal and right lateral recess are also mildly narrowed at this level.   Electronically Signed   By: Inge Rise M.D.   On: 01/14/2015 10:02   Bone survey 02/08/2015 IMPRESSION: Subtle lucencies are noted in the skull, cervical spine, and left femoral greater trochanter. Very subtle changes of myeloma cannot be completely excluded.   ASSESSMENT & PLAN:  71 y.o. African-American male, with past medical history of Graves' disease, vitiligo, hypertension, dyslipidemia, was incidentally found a small hypodense lesion in C5 on CT scan. Lab work reviewed a monoclonal paraprotein.  1. MGUS -His initial SPEP reveal a low-level of monoclonal paraprotein (0.27g/dl), immunoglobulin level were normal, he does not have anemia, renal dysfunction, hypercalcemia on the lab test.  -His previous CT scan revealed a small hyper lytic lesion in the C5 vertebral body however was not obvious on the MRI scan. -A bone survey showed subtle lucencies in multiple bones, indeterminate.  -His bone marrow biopsy showed 5% plasma cells, suggestive of plasma cell dyscrasia. Cytogenetics was normal. -We  previously discussed the natural history of MGUS, and small possibility (1% every year) of developing multiple myeloma. I recommend routine follow-up  -I think his back pain and fatigue unlikely related to his MGUS, lumbar spine MRI in March 2016 showed stenosis, no lytic lesions. -We'll continue monitoring. Given these very stable M protein level,  lab every 6 months -Last M-protein was 0.4 in 01/2017. Today's MM labs are pending. His plt are slightly low but still stable at 138K. Otherwise his labs are normal.  -since his disease have been stable II will repeat labs every 6 months.  -He knows to contact us with any concerns like high calcium, anemia, or elevated liver enzymes through PCP labs or if he develops any new pain.  -f/u in 1 year    3. Mild thrombocytopenia -The platelet count has been in the range of 130 to 153 in the past 2 years, stable overall. -He does have other autoimmune disease including Graves' disease and vitiligo.  - His bone marrow biopsy showed abundant megakaryocytes, some of which display atypical features, the significance is uncertain. -We'll continue follow-up his CBC. Platelet count 128K previously. And 138K today (08/13/17)  4. Chronic back pain  -His lumbar MRI in March 2016 showed severe degenerative changes, which is likely the cause of his back pain. -I strongly encouraged him to follow-up with his sports medicine Dr. Mina Marble  -He hasn't been doing PT due to being busy at work.  -I have recommended pain medication, he declined -I recommended acupuncture or seeing pain specialist. He will think about these options.  -Stable and manageable   5. Fatigue -possible relate to his back pain  -I encouraged him to follow-up with his primary care physician. His TSH was normal.  6. Hypertension, dyslipidemia -He'll continue follow-up with his primary care physician  7. Cancer screening  --pt would like to have a PSA for his next exam and a repeat  colonoscopy  because he is overdue.   Plan -lab reviewed, I will call him next week to review his MM lab results  -Lab in 6 month -Lab and f/u in one year     All questions were answered. The patient knows to call the clinic with any problems, questions or concerns.  I spent 15 minutes counseling the patient face to face. The total time spent in the appointment was 20 minutes and more than 50% was on counseling.  This document serves as a record of services personally performed by Truitt Merle, MD. It was created on her behalf by Joslyn Devon, a trained medical scribe. The creation of this record is based on the scribe's personal observations and the provider's statements to them. This document has been checked and approved by the attending provider.   I have reviewed the above documentation for accuracy and completeness and I agree with the above.   Truitt Merle, MD 08/13/2017

## 2017-08-14 LAB — KAPPA/LAMBDA LIGHT CHAINS
Ig Kappa Free Light Chain: 13.7 mg/L (ref 3.3–19.4)
Ig Lambda Free Light Chain: 10.5 mg/L (ref 5.7–26.3)
Kappa/Lambda FluidC Ratio: 1.3 (ref 0.26–1.65)

## 2017-08-16 ENCOUNTER — Ambulatory Visit: Payer: Medicare Other | Admitting: Hematology

## 2017-08-16 ENCOUNTER — Other Ambulatory Visit: Payer: Medicare Other

## 2017-08-20 LAB — MULTIPLE MYELOMA PANEL, SERUM
Albumin SerPl Elph-Mcnc: 3.5 g/dL (ref 2.9–4.4)
Albumin/Glob SerPl: 1 (ref 0.7–1.7)
Alpha 1: 0.2 g/dL (ref 0.0–0.4)
Alpha2 Glob SerPl Elph-Mcnc: 0.9 g/dL (ref 0.4–1.0)
B-Globulin SerPl Elph-Mcnc: 1.2 g/dL (ref 0.7–1.3)
Gamma Glob SerPl Elph-Mcnc: 1.3 g/dL (ref 0.4–1.8)
Globulin, Total: 3.6 g/dL (ref 2.2–3.9)
IgA, Qn, Serum: 126 mg/dL (ref 61–437)
IgG, Qn, Serum: 1181 mg/dL (ref 700–1600)
IgM, Qn, Serum: 52 mg/dL (ref 20–172)
M Protein SerPl Elph-Mcnc: 0.5 g/dL — ABNORMAL HIGH
Total Protein: 7.1 g/dL (ref 6.0–8.5)

## 2017-08-24 ENCOUNTER — Ambulatory Visit (INDEPENDENT_AMBULATORY_CARE_PROVIDER_SITE_OTHER): Payer: Medicare Other

## 2017-08-24 DIAGNOSIS — Z23 Encounter for immunization: Secondary | ICD-10-CM

## 2017-08-30 DIAGNOSIS — M5441 Lumbago with sciatica, right side: Secondary | ICD-10-CM | POA: Diagnosis not present

## 2017-08-30 DIAGNOSIS — M545 Low back pain: Secondary | ICD-10-CM | POA: Diagnosis not present

## 2017-09-05 ENCOUNTER — Telehealth: Payer: Self-pay | Admitting: *Deleted

## 2017-09-05 NOTE — Telephone Encounter (Signed)
Called & left message for pt to return call for lab results.

## 2017-09-05 NOTE — Telephone Encounter (Signed)
-----   Message from Truitt Merle, MD sent at 09/05/2017  9:52 AM EDT ----- Please let pt know the lab result, his M-protein slightly increased than 6 months ago, but not much a concern, continue monitoring.   Truitt Merle  09/05/2017

## 2017-09-06 DIAGNOSIS — M545 Low back pain: Secondary | ICD-10-CM | POA: Diagnosis not present

## 2017-09-06 DIAGNOSIS — M5416 Radiculopathy, lumbar region: Secondary | ICD-10-CM | POA: Diagnosis not present

## 2017-09-07 NOTE — Telephone Encounter (Signed)
Reached pt's wife today & informed of lab results-slightly elevated M-protein.  She states that she will make sure that he knows.

## 2017-09-13 DIAGNOSIS — M545 Low back pain: Secondary | ICD-10-CM | POA: Diagnosis not present

## 2017-09-13 DIAGNOSIS — M5416 Radiculopathy, lumbar region: Secondary | ICD-10-CM | POA: Diagnosis not present

## 2017-09-18 ENCOUNTER — Ambulatory Visit (INDEPENDENT_AMBULATORY_CARE_PROVIDER_SITE_OTHER): Payer: Medicare Other | Admitting: Internal Medicine

## 2017-09-18 ENCOUNTER — Encounter: Payer: Self-pay | Admitting: Internal Medicine

## 2017-09-18 ENCOUNTER — Encounter: Payer: Medicare Other | Admitting: Internal Medicine

## 2017-09-18 VITALS — BP 156/80 | HR 78 | Temp 98.2°F | Ht 74.0 in | Wt 267.4 lb

## 2017-09-18 DIAGNOSIS — D472 Monoclonal gammopathy: Secondary | ICD-10-CM | POA: Diagnosis not present

## 2017-09-18 DIAGNOSIS — E0580 Other thyrotoxicosis without thyrotoxic crisis or storm: Secondary | ICD-10-CM

## 2017-09-18 DIAGNOSIS — M549 Dorsalgia, unspecified: Secondary | ICD-10-CM

## 2017-09-18 DIAGNOSIS — Z0000 Encounter for general adult medical examination without abnormal findings: Secondary | ICD-10-CM | POA: Diagnosis not present

## 2017-09-18 DIAGNOSIS — E7800 Pure hypercholesterolemia, unspecified: Secondary | ICD-10-CM

## 2017-09-18 DIAGNOSIS — I10 Essential (primary) hypertension: Secondary | ICD-10-CM

## 2017-09-18 DIAGNOSIS — G8929 Other chronic pain: Secondary | ICD-10-CM | POA: Diagnosis not present

## 2017-09-18 LAB — LIPID PANEL
Cholesterol: 167 mg/dL (ref 0–200)
HDL: 59.9 mg/dL (ref >39.00)
LDL Cholesterol: 100 mg/dL — ABNORMAL HIGH (ref 0–99)
NonHDL: 107.32
Total CHOL/HDL Ratio: 3
Triglycerides: 37 mg/dL (ref 0.0–149.0)
VLDL: 7.4 mg/dL (ref 0.0–40.0)

## 2017-09-18 LAB — PSA: PSA: 3.06 ng/mL (ref 0.10–4.00)

## 2017-09-18 LAB — TSH: TSH: 0.57 u[IU]/mL (ref 0.35–4.50)

## 2017-09-18 NOTE — Patient Instructions (Addendum)
DASH Eating Plan DASH stands for "Dietary Approaches to Stop Hypertension." The DASH eating plan is a healthy eating plan that has been shown to reduce high blood pressure (hypertension). It may also reduce your risk for type 2 diabetes, heart disease, and stroke. The DASH eating plan may also help with weight loss. What are tips for following this plan? General guidelines  Avoid eating more than 2,300 mg (milligrams) of salt (sodium) a day. If you have hypertension, you may need to reduce your sodium intake to 1,500 mg a day.  Limit alcohol intake to no more than 1 drink a day for nonpregnant women and 2 drinks a day for men. One drink equals 12 oz of beer, 5 oz of wine, or 1 oz of hard liquor.  Work with your health care provider to maintain a healthy body weight or to lose weight. Ask what an ideal weight is for you.  Get at least 30 minutes of exercise that causes your heart to beat faster (aerobic exercise) most days of the week. Activities may include walking, swimming, or biking.  Work with your health care provider or diet and nutrition specialist (dietitian) to adjust your eating plan to your individual calorie needs. Reading food labels  Check food labels for the amount of sodium per serving. Choose foods with less than 5 percent of the Daily Value of sodium. Generally, foods with less than 300 mg of sodium per serving fit into this eating plan.  To find whole grains, look for the word "whole" as the first word in the ingredient list. Shopping  Buy products labeled as "low-sodium" or "no salt added."  Buy fresh foods. Avoid canned foods and premade or frozen meals. Cooking  Avoid adding salt when cooking. Use salt-free seasonings or herbs instead of table salt or sea salt. Check with your health care provider or pharmacist before using salt substitutes.  Do not fry foods. Cook foods using healthy methods such as baking, boiling, grilling, and broiling instead.  Cook with  heart-healthy oils, such as olive, canola, soybean, or sunflower oil. Meal planning   Eat a balanced diet that includes: ? 5 or more servings of fruits and vegetables each day. At each meal, try to fill half of your plate with fruits and vegetables. ? Up to 6-8 servings of whole grains each day. ? Less than 6 oz of lean meat, poultry, or fish each day. A 3-oz serving of meat is about the same size as a deck of cards. One egg equals 1 oz. ? 2 servings of low-fat dairy each day. ? A serving of nuts, seeds, or beans 5 times each week. ? Heart-healthy fats. Healthy fats called Omega-3 fatty acids are found in foods such as flaxseeds and coldwater fish, like sardines, salmon, and mackerel.  Limit how much you eat of the following: ? Canned or prepackaged foods. ? Food that is high in trans fat, such as fried foods. ? Food that is high in saturated fat, such as fatty meat. ? Sweets, desserts, sugary drinks, and other foods with added sugar. ? Full-fat dairy products.  Do not salt foods before eating.  Try to eat at least 2 vegetarian meals each week.  Eat more home-cooked food and less restaurant, buffet, and fast food.  When eating at a restaurant, ask that your food be prepared with less salt or no salt, if possible. What foods are recommended? The items listed may not be a complete list. Talk with your dietitian about what   dietary choices are best for you. Grains Whole-grain or whole-wheat bread. Whole-grain or whole-wheat pasta. Brown rice. Oatmeal. Quinoa. Bulgur. Whole-grain and low-sodium cereals. Pita bread. Low-fat, low-sodium crackers. Whole-wheat flour tortillas. Vegetables Fresh or frozen vegetables (raw, steamed, roasted, or grilled). Low-sodium or reduced-sodium tomato and vegetable juice. Low-sodium or reduced-sodium tomato sauce and tomato paste. Low-sodium or reduced-sodium canned vegetables. Fruits All fresh, dried, or frozen fruit. Canned fruit in natural juice (without  added sugar). Meat and other protein foods Skinless chicken or turkey. Ground chicken or turkey. Pork with fat trimmed off. Fish and seafood. Egg whites. Dried beans, peas, or lentils. Unsalted nuts, nut butters, and seeds. Unsalted canned beans. Lean cuts of beef with fat trimmed off. Low-sodium, lean deli meat. Dairy Low-fat (1%) or fat-free (skim) milk. Fat-free, low-fat, or reduced-fat cheeses. Nonfat, low-sodium ricotta or cottage cheese. Low-fat or nonfat yogurt. Low-fat, low-sodium cheese. Fats and oils Soft margarine without trans fats. Vegetable oil. Low-fat, reduced-fat, or light mayonnaise and salad dressings (reduced-sodium). Canola, safflower, olive, soybean, and sunflower oils. Avocado. Seasoning and other foods Herbs. Spices. Seasoning mixes without salt. Unsalted popcorn and pretzels. Fat-free sweets. What foods are not recommended? The items listed may not be a complete list. Talk with your dietitian about what dietary choices are best for you. Grains Baked goods made with fat, such as croissants, muffins, or some breads. Dry pasta or rice meal packs. Vegetables Creamed or fried vegetables. Vegetables in a cheese sauce. Regular canned vegetables (not low-sodium or reduced-sodium). Regular canned tomato sauce and paste (not low-sodium or reduced-sodium). Regular tomato and vegetable juice (not low-sodium or reduced-sodium). Pickles. Olives. Fruits Canned fruit in a light or heavy syrup. Fried fruit. Fruit in cream or butter sauce. Meat and other protein foods Fatty cuts of meat. Ribs. Fried meat. Bacon. Sausage. Bologna and other processed lunch meats. Salami. Fatback. Hotdogs. Bratwurst. Salted nuts and seeds. Canned beans with added salt. Canned or smoked fish. Whole eggs or egg yolks. Chicken or turkey with skin. Dairy Whole or 2% milk, cream, and half-and-half. Whole or full-fat cream cheese. Whole-fat or sweetened yogurt. Full-fat cheese. Nondairy creamers. Whipped toppings.  Processed cheese and cheese spreads. Fats and oils Butter. Stick margarine. Lard. Shortening. Ghee. Bacon fat. Tropical oils, such as coconut, palm kernel, or palm oil. Seasoning and other foods Salted popcorn and pretzels. Onion salt, garlic salt, seasoned salt, table salt, and sea salt. Worcestershire sauce. Tartar sauce. Barbecue sauce. Teriyaki sauce. Soy sauce, including reduced-sodium. Steak sauce. Canned and packaged gravies. Fish sauce. Oyster sauce. Cocktail sauce. Horseradish that you find on the shelf. Ketchup. Mustard. Meat flavorings and tenderizers. Bouillon cubes. Hot sauce and Tabasco sauce. Premade or packaged marinades. Premade or packaged taco seasonings. Relishes. Regular salad dressings. Where to find more information:  National Heart, Lung, and Blood Institute: www.nhlbi.nih.gov  American Heart Association: www.heart.org Summary  The DASH eating plan is a healthy eating plan that has been shown to reduce high blood pressure (hypertension). It may also reduce your risk for type 2 diabetes, heart disease, and stroke.  With the DASH eating plan, you should limit salt (sodium) intake to 2,300 mg a day. If you have hypertension, you may need to reduce your sodium intake to 1,500 mg a day.  When on the DASH eating plan, aim to eat more fresh fruits and vegetables, whole grains, lean proteins, low-fat dairy, and heart-healthy fats.  Work with your health care provider or diet and nutrition specialist (dietitian) to adjust your eating plan to your individual   calorie needs.   Schedule your colonoscopy to help detect colon cancer.  Limit your sodium (Salt) intake    It is important that you exercise regularly, at least 20 minutes 3 to 4 times per week.  If you develop chest pain or shortness of breath seek  medical attention.  You need to lose weight.  Consider a lower calorie diet and regular exercise.

## 2017-09-18 NOTE — Progress Notes (Signed)
Subjective:    Patient ID: Gordon Phillips, male    DOB: 07-07-1946, 71 y.o.   MRN: [id number]  HPI  71 year old patient who is seen today for a preventive health examination and subsequent Medicare wellness visit He is followed by pulmonary medicine for OSA.  He is also followed by hematology due to Nelson.  He has started physical therapy for chronic low back pain secondary to spinal stenosis He has a history of essential hypertension and remote history of Graves' disease.  He has obesity and dyslipidemia.  He remains on statin therapy and daily aspirin.  Past Medical History:  Diagnosis Date  . ALLERGIC RHINITIS 01/25/2010  . Arthritis    "neck, back, shoulders," (02/20/2017)  . Chronic lower back pain   . High cholesterol   . Hx of cardiovascular stress test    a. Lexiscan Myoview 02/06/13:  EF 59%, no ischemia or scar  . Hx of echocardiogram    a.  Echo 02/03/13:  Mild LVH, mild focal basal septal hypertrophy, EF 50-55%, Gr 1 DD, mild LAE  . HYPERTENSION 01/25/2010  . HYPERTHYROIDISM 01/25/2010   S/P radioactive iodine "back in the 1980s" (02/20/2017)  . Impaired glucose tolerance   . 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 Obesity   . OSA on CPAP    Uses CPAP nightly  . Pericarditis 1980's  . 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  Marland Kitchen Vitiligo      Social History   Socioeconomic History  . Marital status: Married    Spouse name: Not on file  . Number of children: 2  . Years of education: Not on file  . Highest education level: Not on file  Social Needs  . Financial resource strain: Not on file  . Food insecurity - worry: Not on file  . Food insecurity - inability: Not on file  . Transportation needs - medical: Not on file  . Transportation needs - non-medical: Not on file  Occupational History  . Occupation: Press photographer  Tobacco Use  . Smoking status: Former Smoker    Packs/day: 0.50    Years: 8.00    Pack years:  4.00    Types: Cigarettes    Last attempt to quit: 1968    Years since quitting: 50.9  . Smokeless tobacco: Never Used  Substance and Sexual Activity  . Alcohol use: Yes    Comment: 02/20/2017 "couple drinks/month on average"  . Drug use: No  . Sexual activity: Yes  Other Topics Concern  . Not on file  Social History Narrative  . Not on file    Past Surgical History:  Procedure Laterality Date  . BACK SURGERY    . INGUINAL HERNIA REPAIR Bilateral   . LUMBAR LAMINECTOMY    . UMBILICAL HERNIA REPAIR      Family History  Problem Relation Age of Onset  . Stroke Father        Passed away in his 42s  . Thyroid disease Sister        goiter  . Sudden death Brother        ? Drug use, no autopsy  . Throat cancer Brother     Allergies  Allergen Reactions  . Flomax [Tamsulosin Hcl] Itching  . Relpax [Eletriptan]     Chest pain, hypertension, sweating    Current Outpatient Medications on File Prior to Visit  Medication Sig Dispense Refill  . aspirin  EC 81 MG tablet Take 81 mg by mouth daily.    Marland Kitchen doxazosin (CARDURA) 2 MG tablet TAKE 1 TABLET AT BEDTIME 90 tablet 3  . fluticasone (FLONASE) 50 MCG/ACT nasal spray Place 2 sprays into both nostrils as needed for allergies or rhinitis.    Marland Kitchen losartan (COZAAR) 100 MG tablet TAKE 1 TABLET DAILY 90 tablet 3  . metoprolol succinate (TOPROL-XL) 100 MG 24 hr tablet TAKE 1 TABLET DAILY 90 tablet 3  . pravastatin (PRAVACHOL) 40 MG tablet TAKE 1 TABLET DAILY 90 tablet 3  . predniSONE (STERAPRED UNI-PAK 21 TAB) 10 MG (21) TBPK tablet TK UTD FOR 6 DAYS  0  . sildenafil (VIAGRA) 100 MG tablet Take 100 mg by mouth daily as needed for erectile dysfunction (erectile dysfunction).    . sodium chloride (OCEAN) 0.65 % SOLN nasal spray Place 1 spray into both nostrils daily as needed for congestion.     No current facility-administered medications on file prior to visit.     BP (!) 156/80 (BP Location: Left Arm, Patient Position: Sitting, Cuff  Size: Normal)   Pulse 78   Temp 98.2 F (36.8 C) (Oral)   Ht 6\' 2"  (1.88 m)   Wt 267 lb 6.4 oz (121.3 kg)   SpO2 96%   BMI 34.33 kg/m    Subsequent Medicare wellness visit  1. Risk factors, based on past  M,S,F history.  Cardiovascular risk factors include a history of hypertension and dyslipidemia  2.  Physical activities: Has recently started physical therapy for chronic low back pain.  Anticipates starting Silver sneakers program at the local [facility]  3.  Depression/mood:  No history of major depression or mood disorder 4.  Hearing: No significant deficits  5.  ADL's: Independent  6.  Fall risk: Low  7.  Home safety: No problems identified 8.  Height weight, and visual acuity; height and weight stable no change in visual acuity 9.  Counseling: Continue heart healthy diet and efforts at weight loss  10. Lab orders based on risk factors: We will check a PSA TSH and lipid profile  11. Referral : Follow with hematology and pulmonary medicine  12. Care plan: Continue efforts at aggressive risk factor modification  13. Cognitive assessment: Alert and oriented with normal affect.  No cognitive dysfunction  14. Screening: Patient provided with a written and personalized 5-10 year screening schedule in the AVS.    15. Provider List Update: Pulmonary hematology and orthopedics as well as primary care    Review of Systems  Constitutional: Negative for appetite change, chills, fatigue and fever.  HENT: Negative for congestion, dental problem, ear pain, hearing loss, sore throat, tinnitus, trouble swallowing and voice change.   Eyes: Negative for pain, discharge and visual disturbance.  Respiratory: Negative for cough, chest tightness, wheezing and stridor.   Cardiovascular: Negative for chest pain, palpitations and leg swelling.  Gastrointestinal: Negative for abdominal distention, abdominal pain, blood in stool, constipation, diarrhea, nausea and vomiting.  Genitourinary:  Positive for decreased urine volume, difficulty urinating and frequency. Negative for discharge, flank pain, genital sores, hematuria and urgency.  Musculoskeletal: Positive for back pain. Negative for arthralgias, gait problem, joint swelling, myalgias and neck stiffness.  Skin: Negative for rash.  Neurological: Negative for dizziness, syncope, speech difficulty, weakness, numbness and headaches.  Hematological: Negative for adenopathy. Does not bruise/bleed easily.  Psychiatric/Behavioral: Negative for behavioral problems and dysphoric mood. The patient is not nervous/anxious.        Objective:   Physical  Exam  Constitutional: He appears well-developed and well-nourished.  Weight 267 Blood pressure 136/78  HENT:  Head: Normocephalic and atraumatic.  Right Ear: External ear normal.  Left Ear: External ear normal.  Nose: Nose normal.  Mouth/Throat: Oropharynx is clear and moist.  Eyes: Conjunctivae and EOM are normal. Pupils are equal, round, and reactive to light. No scleral icterus.  Neck: Normal range of motion. Neck supple. No JVD present. No thyromegaly present.  Cardiovascular: Regular rhythm, normal heart sounds and intact distal pulses. Exam reveals no gallop and no friction rub.  No murmur heard. Dorsalis pedis pulses difficult to palpate  Pulmonary/Chest: Effort normal and breath sounds normal. He exhibits no tenderness.  Abdominal: Soft. Bowel sounds are normal. He exhibits no distension and no mass. There is no tenderness.  Genitourinary: Penis normal. Rectal exam shows guaiac negative stool.  Genitourinary Comments: Symmetrical prostate enlargement  Musculoskeletal: Normal range of motion. He exhibits no edema or tenderness.  Lymphadenopathy:    He has no cervical adenopathy.  Neurological: He is alert. He has normal reflexes. No cranial nerve deficit. Coordination normal.  Skin: Skin is warm and dry. No rash noted.  Psychiatric: He has a normal mood and affect. His  behavior is normal.          Assessment & Plan:   Preventive health examination. Subsequent Medicare wellness visit   hypertension OSA Obesity Chronic low back pain MUGUS  dyslipidemia     We will check screening lab including TSH lipid profile and PSA Patient has received a recall letter for colonoscopy.  He will set up at his convenience Continue home blood pressure monitoring Weight loss encouraged Low-salt diet recommended  Follow-up 6 months  [id number],PETER Pilar Plate

## 2017-09-25 DIAGNOSIS — M5416 Radiculopathy, lumbar region: Secondary | ICD-10-CM | POA: Diagnosis not present

## 2017-09-25 DIAGNOSIS — M545 Low back pain: Secondary | ICD-10-CM | POA: Diagnosis not present

## 2017-10-15 DIAGNOSIS — G4733 Obstructive sleep apnea (adult) (pediatric): Secondary | ICD-10-CM | POA: Diagnosis not present

## 2018-01-01 DIAGNOSIS — H2513 Age-related nuclear cataract, bilateral: Secondary | ICD-10-CM | POA: Diagnosis not present

## 2018-01-01 DIAGNOSIS — H11442 Conjunctival cysts, left eye: Secondary | ICD-10-CM | POA: Diagnosis not present

## 2018-01-01 DIAGNOSIS — G43909 Migraine, unspecified, not intractable, without status migrainosus: Secondary | ICD-10-CM | POA: Diagnosis not present

## 2018-01-01 DIAGNOSIS — H40013 Open angle with borderline findings, low risk, bilateral: Secondary | ICD-10-CM | POA: Diagnosis not present

## 2018-01-14 DIAGNOSIS — M545 Low back pain: Secondary | ICD-10-CM | POA: Diagnosis not present

## 2018-01-14 DIAGNOSIS — M5416 Radiculopathy, lumbar region: Secondary | ICD-10-CM | POA: Diagnosis not present

## 2018-01-14 DIAGNOSIS — G4733 Obstructive sleep apnea (adult) (pediatric): Secondary | ICD-10-CM | POA: Diagnosis not present

## 2018-01-15 ENCOUNTER — Other Ambulatory Visit: Payer: Self-pay | Admitting: Orthopedic Surgery

## 2018-01-15 DIAGNOSIS — M5416 Radiculopathy, lumbar region: Secondary | ICD-10-CM

## 2018-01-22 ENCOUNTER — Telehealth: Payer: Self-pay | Admitting: Internal Medicine

## 2018-01-22 NOTE — Telephone Encounter (Signed)
Copied from [place]. Topic: Quick Communication - See Telephone Encounter >> Jan 22, 2018 12:56 PM Vernona Rieger wrote: CRM for notification. See Telephone encounter for:   01/22/18.  Pt said that [facility] contacted him about his losartan. They wanted him to contact his PCP to get an alternative med for this. Please advise.

## 2018-01-22 NOTE — Telephone Encounter (Signed)
Sir, please see below message please advise.

## 2018-01-22 NOTE — Telephone Encounter (Signed)
There was a single manufacturer/batch of losartan that was recalled due to fear of parotid contamination.  Suggest the patient the losartan to his drugstore for a new prescription from a different manufacture

## 2018-01-29 ENCOUNTER — Other Ambulatory Visit: Payer: Medicare Other

## 2018-02-11 ENCOUNTER — Inpatient Hospital Stay: Payer: Medicare Other | Attending: Hematology

## 2018-02-11 DIAGNOSIS — D472 Monoclonal gammopathy: Secondary | ICD-10-CM | POA: Insufficient documentation

## 2018-02-11 LAB — COMPREHENSIVE METABOLIC PANEL
ALT: 11 U/L (ref 0–55)
AST: 18 U/L (ref 5–34)
Albumin: 3.6 g/dL (ref 3.5–5.0)
Alkaline Phosphatase: 53 U/L (ref 40–150)
Anion gap: 6 (ref 3–11)
BUN: 13 mg/dL (ref 7–26)
CO2: 28 mmol/L (ref 22–29)
Calcium: 9.5 mg/dL (ref 8.4–10.4)
Chloride: 105 mmol/L (ref 98–109)
Creatinine, Ser: 1.22 mg/dL (ref 0.70–1.30)
GFR calc Af Amer: >60 mL/min (ref >60)
GFR calc non Af Amer: 58 mL/min — ABNORMAL LOW (ref >60)
Glucose, Bld: 151 mg/dL — ABNORMAL HIGH (ref 70–140)
Potassium: 4.4 mmol/L (ref 3.5–5.1)
Sodium: 139 mmol/L (ref 136–145)
Total Bilirubin: 0.6 mg/dL (ref 0.2–1.2)
Total Protein: 7.3 g/dL (ref 6.4–8.3)

## 2018-02-11 LAB — CBC WITH DIFFERENTIAL/PLATELET
Basophils Absolute: 0 10*3/uL (ref 0.0–0.1)
Basophils Relative: 0 %
Eosinophils Absolute: 0.1 10*3/uL (ref 0.0–0.5)
Eosinophils Relative: 3 %
HCT: 40.3 % (ref 38.4–49.9)
Hemoglobin: 13.2 g/dL (ref 13.0–17.1)
Lymphocytes Relative: 30 %
Lymphs Abs: 1.5 10*3/uL (ref 0.9–3.3)
MCH: 30.4 pg (ref 27.2–33.4)
MCHC: 32.8 g/dL (ref 32.0–36.0)
MCV: 92.9 fL (ref 79.3–98.0)
Monocytes Absolute: 0.2 10*3/uL (ref 0.1–0.9)
Monocytes Relative: 4 %
Neutro Abs: 3.2 10*3/uL (ref 1.5–6.5)
Neutrophils Relative %: 63 %
Platelets: 144 10*3/uL (ref 140–400)
RBC: 4.34 MIL/uL (ref 4.20–5.82)
RDW: 13.4 % (ref 11.0–14.6)
WBC: 5 10*3/uL (ref 4.0–10.3)

## 2018-02-12 ENCOUNTER — Ambulatory Visit
Admission: RE | Admit: 2018-02-12 | Discharge: 2018-02-12 | Disposition: A | Discharge status: Home / Self Care | Payer: Medicare Other | Source: Ambulatory Visit | Attending: Orthopedic Surgery | Admitting: Orthopedic Surgery

## 2018-02-12 DIAGNOSIS — M5416 Radiculopathy, lumbar region: Secondary | ICD-10-CM

## 2018-02-12 DIAGNOSIS — M545 Low back pain: Secondary | ICD-10-CM | POA: Diagnosis not present

## 2018-02-12 LAB — KAPPA/LAMBDA LIGHT CHAINS
Kappa free light chain: 13.7 mg/L (ref 3.3–19.4)
Kappa, lambda light chain ratio: 1.41 (ref 0.26–1.65)
Lambda free light chains: 9.7 mg/L (ref 5.7–26.3)

## 2018-02-14 LAB — MULTIPLE MYELOMA PANEL, SERUM
Albumin SerPl Elph-Mcnc: 3.5 g/dL (ref 2.9–4.4)
Albumin/Glob SerPl: 1.2 (ref 0.7–1.7)
Alpha 1: 0.2 g/dL (ref 0.0–0.4)
Alpha2 Glob SerPl Elph-Mcnc: 0.9 g/dL (ref 0.4–1.0)
B-Globulin SerPl Elph-Mcnc: 1 g/dL (ref 0.7–1.3)
Gamma Glob SerPl Elph-Mcnc: 1 g/dL (ref 0.4–1.8)
Globulin, Total: 3.1 g/dL (ref 2.2–3.9)
IgA: 121 mg/dL (ref 61–437)
IgG (Immunoglobin G), Serum: 1229 mg/dL (ref 700–1600)
IgM (Immunoglobulin M), Srm: 58 mg/dL (ref 20–172)
M Protein SerPl Elph-Mcnc: 0.4 g/dL — ABNORMAL HIGH
Total Protein ELP: 6.6 g/dL (ref 6.0–8.5)

## 2018-02-18 DIAGNOSIS — M4696 Unspecified inflammatory spondylopathy, lumbar region: Secondary | ICD-10-CM | POA: Diagnosis not present

## 2018-02-19 ENCOUNTER — Telehealth: Payer: Self-pay | Admitting: *Deleted

## 2018-02-19 NOTE — Telephone Encounter (Signed)
-----   Message from Truitt Merle, MD sent at 02/16/2018 12:06 PM EDT ----- Please let pt know that his M-protein is stable, no other concerns. Thanks  Truitt Merle  02/16/2018

## 2018-02-19 NOTE — Telephone Encounter (Signed)
Spoke with Gordon Phillips and informed Gordon Phillips of M-protein stable, no other concerns as per Dr. Ernestina Penna instructions.  Gordon Phillips voiced understanding.

## 2018-02-25 DIAGNOSIS — M5416 Radiculopathy, lumbar region: Secondary | ICD-10-CM | POA: Diagnosis not present

## 2018-03-11 ENCOUNTER — Ambulatory Visit: Payer: Medicare Other | Admitting: Pulmonary Disease

## 2018-03-11 ENCOUNTER — Encounter: Payer: Self-pay | Admitting: Pulmonary Disease

## 2018-03-11 DIAGNOSIS — G4733 Obstructive sleep apnea (adult) (pediatric): Secondary | ICD-10-CM

## 2018-03-11 NOTE — Addendum Note (Signed)
Addended by: Valerie Salts on: 03/11/2018 04:30 PM   Modules accepted: Orders

## 2018-03-11 NOTE — Assessment & Plan Note (Signed)
CPAP is set at 11 cm and seems to be working well. We will check download on your machine.  Trial of Resmed air fit F 30 fullface mask  Weight loss encouraged, compliance with goal of at least 4-6 hrs every night is the expectation. Advised against medications with sedative side effects Cautioned against driving when sleepy - understanding that sleepiness will vary on a day to day basis

## 2018-03-11 NOTE — Progress Notes (Signed)
   Subjective:    Patient ID: Delvis Kau, male    DOB: 1946/04/05, 72 y.o.   MRN: [id number]  HPI  72 year old hypertensive for follow-up of OSA. He has been on CPAP for 4 years now with good improvement in his daytime somnolence and fatigue.  This is the best thing that happened to my sleep" He continues to rest well and denies sleep pressure her daytime somnolence. No problems with pressure or humidity.  Blood pressure is well controlled today  Significant tests/ events reviewed  04/2014 Mod- AHI 28/h-CPAP 11 cm  Review of Systems Patient denies significant dyspnea,cough, hemoptysis,  chest pain, palpitations, pedal edema, orthopnea, paroxysmal nocturnal dyspnea, lightheadedness, nausea, vomiting, abdominal or  leg pains      Objective:   Physical Exam  Gen. Pleasant, obese, in no distress ENT - class 2 airway, no post nasal drip Neck: No JVD, no thyromegaly, no carotid bruits Lungs: no use of accessory muscles, no dullness to percussion, decreased without rales or rhonchi  Cardiovascular: Rhythm regular, heart sounds  normal, no murmurs or gallops, no peripheral edema Musculoskeletal: No deformities, no cyanosis or clubbing , no tremors Skin- vitiligo       Assessment & Plan:

## 2018-03-11 NOTE — Patient Instructions (Signed)
CPAP is set at 11 cm and seems to be working well. We will check download on your machine.  Trial of Resmed air fit F 30 fullface mask

## 2018-03-12 DIAGNOSIS — M5416 Radiculopathy, lumbar region: Secondary | ICD-10-CM | POA: Diagnosis not present

## 2018-03-19 ENCOUNTER — Encounter: Payer: Self-pay | Admitting: Pulmonary Disease

## 2018-03-25 ENCOUNTER — Encounter: Payer: Self-pay | Admitting: Internal Medicine

## 2018-04-02 DIAGNOSIS — M5416 Radiculopathy, lumbar region: Secondary | ICD-10-CM | POA: Diagnosis not present

## 2018-04-16 ENCOUNTER — Other Ambulatory Visit: Payer: Self-pay | Admitting: Internal Medicine

## 2018-04-16 NOTE — Telephone Encounter (Signed)
Called patient and left message to return call to schedule follow-up appt.

## 2018-04-17 NOTE — Telephone Encounter (Signed)
Patient need to schedule an ov for more refills. 

## 2018-04-18 NOTE — Telephone Encounter (Signed)
Pt has made appt for Tues 6/18 at 12:45. But in the meantime, pt is requesting a 90 day  Sent to   [place], [place] [phone] (Phone) [phone] (Fax)    Pt will be out if this is not sent to [facility] now

## 2018-04-23 ENCOUNTER — Ambulatory Visit (INDEPENDENT_AMBULATORY_CARE_PROVIDER_SITE_OTHER): Payer: Medicare Other | Admitting: Internal Medicine

## 2018-04-23 ENCOUNTER — Encounter: Payer: Self-pay | Admitting: Internal Medicine

## 2018-04-23 VITALS — BP 130/80 | Temp 97.9°F | Wt 272.0 lb

## 2018-04-23 DIAGNOSIS — I10 Essential (primary) hypertension: Secondary | ICD-10-CM

## 2018-04-23 DIAGNOSIS — D472 Monoclonal gammopathy: Secondary | ICD-10-CM | POA: Diagnosis not present

## 2018-04-23 DIAGNOSIS — R7302 Impaired glucose tolerance (oral): Secondary | ICD-10-CM | POA: Diagnosis not present

## 2018-04-23 DIAGNOSIS — E7800 Pure hypercholesterolemia, unspecified: Secondary | ICD-10-CM

## 2018-04-23 DIAGNOSIS — M549 Dorsalgia, unspecified: Secondary | ICD-10-CM

## 2018-04-23 DIAGNOSIS — G8929 Other chronic pain: Secondary | ICD-10-CM

## 2018-04-23 MED ORDER — PRAVASTATIN SODIUM 40 MG PO TABS: 40.0000 mg | ORAL_TABLET | Freq: Every day | ORAL | 3 refills | Status: DC

## 2018-04-23 MED ORDER — FLUTICASONE PROPIONATE 50 MCG/ACT NA SUSP: 2.0000 | NASAL | 4 refills | Status: DC | PRN

## 2018-04-23 MED ORDER — LOSARTAN POTASSIUM 100 MG PO TABS: 100.0000 mg | ORAL_TABLET | Freq: Every day | ORAL | 3 refills | Status: DC

## 2018-04-23 MED ORDER — METOPROLOL SUCCINATE ER 100 MG PO TB24: 100.0000 mg | ORAL_TABLET | Freq: Every day | ORAL | 3 refills | Status: DC

## 2018-04-23 NOTE — Progress Notes (Signed)
Subjective:    Patient ID: Gordon Phillips, male    DOB: 01-01-1946, 72 y.o.   MRN: [id number]  HPI  72 year old patient who is seen today for six-month follow-up.  He is followed by hematology due to [place].  As well as coronary artery disease.  He remains on aspirin and statin therapy.  Blood pressure well controlled today he has essential hypertension.  He has a history of cerebrovascular disease.  He was last admitted to the hospital about 14 months ago for evaluation of chest pain.  Myocardial perfusion imaging study was low risk with an ejection fraction of 50%  Past Medical History:  Diagnosis Date  . ALLERGIC RHINITIS 01/25/2010  . Arthritis    "neck, back, shoulders," (02/20/2017)  . Chronic lower back pain   . High cholesterol   . Hx of cardiovascular stress test    a. Lexiscan Myoview 02/06/13:  EF 59%, no ischemia or scar  . Hx of echocardiogram    a.  Echo 02/03/13:  Mild LVH, mild focal basal septal hypertrophy, EF 50-55%, Gr 1 DD, mild LAE  . HYPERTENSION 01/25/2010  . HYPERTHYROIDISM 01/25/2010   S/P radioactive iodine "back in the 1980s" (02/20/2017)  . Impaired glucose tolerance   . 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 Obesity   . OSA on CPAP    Uses CPAP nightly  . Pericarditis 1980's  . 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  Marland Kitchen Vitiligo      Social History   Socioeconomic History  . Marital status: Married    Spouse name: Not on file  . Number of children: 2  . Years of education: Not on file  . Highest education level: Not on file  Occupational History  . Occupation: Geographical information systems officer  . Financial resource strain: Not on file  . Food insecurity:    Worry: Not on file    Inability: Not on file  . Transportation needs:    Medical: Not on file    Non-medical: Not on file  Tobacco Use  . Smoking status: Former Smoker    Packs/day: 0.50    Years: 8.00    Pack years: 4.00    Types:  Cigarettes    Last attempt to quit: 1968    Years since quitting: 51.4  . Smokeless tobacco: Never Used  Substance and Sexual Activity  . Alcohol use: Yes    Comment: 02/20/2017 "couple drinks/month on average"  . Drug use: No  . Sexual activity: Yes  Lifestyle  . Physical activity:    Days per week: Not on file    Minutes per session: Not on file  . Stress: Not on file  Relationships  . Social connections:    Talks on phone: Not on file    Gets together: Not on file    Attends religious service: Not on file    Active member of club or organization: Not on file    Attends meetings of clubs or organizations: Not on file    Relationship status: Not on file  . Intimate partner violence:    Fear of current or ex partner: Not on file    Emotionally abused: Not on file    Physically abused: Not on file    Forced sexual activity: Not on file  Other Topics Concern  . Not on file  Social History Narrative  . Not on file  Past Surgical History:  Procedure Laterality Date  . BACK SURGERY    . INGUINAL HERNIA REPAIR Bilateral   . LUMBAR LAMINECTOMY    . UMBILICAL HERNIA REPAIR      Family History  Problem Relation Age of Onset  . Stroke Father        Passed away in his 18s  . Thyroid disease Sister        goiter  . Sudden death Brother        ? Drug use, no autopsy  . Throat cancer Brother     Allergies  Allergen Reactions  . Flomax [Tamsulosin Hcl] Itching  . Relpax [Eletriptan]     Chest pain, hypertension, sweating    Current Outpatient Medications on File Prior to Visit  Medication Sig Dispense Refill  . aspirin EC 81 MG tablet Take 81 mg by mouth daily.    Marland Kitchen doxazosin (CARDURA) 2 MG tablet TAKE 1 TABLET AT BEDTIME 90 tablet 3  . predniSONE (STERAPRED UNI-PAK 21 TAB) 10 MG (21) TBPK tablet TK UTD FOR 6 DAYS  0  . sildenafil (VIAGRA) 100 MG tablet Take 100 mg by mouth daily as needed for erectile dysfunction (erectile dysfunction).    . sodium chloride (OCEAN)  0.65 % SOLN nasal spray Place 1 spray into both nostrils daily as needed for congestion.     No current facility-administered medications on file prior to visit.     BP 130/80 (BP Location: Right Arm, Patient Position: Sitting, Cuff Size: Large)   Temp 97.9 F (36.6 C) (Oral)   Wt 272 lb (123.4 kg)   BMI 34.92 kg/m     Review of Systems  Constitutional: Negative for appetite change, chills, fatigue and fever.  HENT: Negative for congestion, dental problem, ear pain, hearing loss, sore throat, tinnitus, trouble swallowing and voice change.   Eyes: Negative for pain, discharge and visual disturbance.  Respiratory: Negative for cough, chest tightness, wheezing and stridor.   Cardiovascular: Negative for chest pain, palpitations and leg swelling.  Gastrointestinal: Negative for abdominal distention, abdominal pain, blood in stool, constipation, diarrhea, nausea and vomiting.  Genitourinary: Negative for difficulty urinating, discharge, flank pain, genital sores, hematuria and urgency.  Musculoskeletal: Negative for arthralgias, back pain, gait problem, joint swelling, myalgias and neck stiffness.  Skin: Negative for rash.  Neurological: Negative for dizziness, syncope, speech difficulty, weakness, numbness and headaches.  Hematological: Negative for adenopathy. Does not bruise/bleed easily.  Psychiatric/Behavioral: Negative for behavioral problems and dysphoric mood. The patient is not nervous/anxious.        Objective:   Physical Exam  Constitutional: He is oriented to person, place, and time. He appears well-developed.  Blood pressure 130/78  HENT:  Head: Normocephalic.  Right Ear: External ear normal.  Left Ear: External ear normal.  Eyes: Conjunctivae and EOM are normal.  Neck: Normal range of motion.  Cardiovascular: Normal rate and normal heart sounds.  Pulmonary/Chest: Breath sounds normal.  Abdominal: Bowel sounds are normal.  Musculoskeletal: Normal range of motion. He  exhibits no edema or tenderness.  Neurological: He is alert and oriented to person, place, and time.  Psychiatric: He has a normal mood and affect. His behavior is normal.          Assessment & Plan:   Essential hypertension well-controlled Obesity/OSA.  Weight loss encouraged Dyslipidemia continue statin therapy Chronic low back pain.  Patient present receiving epidurals with benefit Follow-up with new provider in 4 to 6 months   Marletta Lor

## 2018-04-23 NOTE — Telephone Encounter (Signed)
Was this Rx denied today since it was not refilled? Please advise

## 2018-04-23 NOTE — Patient Instructions (Signed)
Limit your sodium (Salt) intake    It is important that you exercise regularly, at least 20 minutes 3 to 4 times per week.  If you develop chest pain or shortness of breath seek  medical attention.  Please check your blood pressure on a regular basis.  If it is consistently greater than 150/90, please make an office appointment.  Return in 6 months for follow-up   

## 2018-04-30 ENCOUNTER — Other Ambulatory Visit: Payer: Self-pay

## 2018-04-30 ENCOUNTER — Ambulatory Visit (AMBULATORY_SURGERY_CENTER): Payer: Self-pay | Admitting: *Deleted

## 2018-04-30 VITALS — Ht 74.0 in | Wt 268.0 lb

## 2018-04-30 DIAGNOSIS — Z1211 Encounter for screening for malignant neoplasm of colon: Secondary | ICD-10-CM

## 2018-04-30 MED ORDER — NA SULFATE-K SULFATE-MG SULF 17.5-3.13-1.6 GM/177ML PO SOLN: 1.0000 | Freq: Once | ORAL | 0 refills | Status: AC

## 2018-04-30 NOTE — Progress Notes (Signed)
Denies allergies to eggs or soy products. Denies complications with sedation or anesthesia. Denies O2 use. Denies use of diet or weight loss medications.  Emmi instructions given for colonoscopy.  

## 2018-05-03 ENCOUNTER — Encounter: Payer: Self-pay | Admitting: Internal Medicine

## 2018-05-14 ENCOUNTER — Ambulatory Visit (AMBULATORY_SURGERY_CENTER): Payer: Medicare Other | Admitting: Internal Medicine

## 2018-05-14 ENCOUNTER — Encounter: Payer: Self-pay | Admitting: Internal Medicine

## 2018-05-14 VITALS — BP 151/83 | HR 56 | Temp 99.6°F | Resp 17 | Ht 72.0 in | Wt 272.0 lb

## 2018-05-14 DIAGNOSIS — Z1211 Encounter for screening for malignant neoplasm of colon: Secondary | ICD-10-CM | POA: Diagnosis present

## 2018-05-14 DIAGNOSIS — D123 Benign neoplasm of transverse colon: Secondary | ICD-10-CM

## 2018-05-14 MED ORDER — SODIUM CHLORIDE 0.9 % IV SOLN: 500.0000 mL | Freq: Once | INTRAVENOUS | Status: DC

## 2018-05-14 NOTE — Progress Notes (Signed)
Pt's states no medical or surgical changes since previsit or office visit. 

## 2018-05-14 NOTE — Op Note (Signed)
[place] Patient Name: Gordon Phillips Procedure Date: 05/14/2018 1:57 PM MRN: [id number] Endoscopist: Docia Chuck. Henrene Pastor , MD Age: 72 Referring MD:  Date of Birth: Nov 01, 1946 Gender: Male Account #: [id number] Procedure:                Colonoscopy, With cold snare polypectomy x 1 Indications:              Screening for colorectal malignant neoplasm.                            Reports negative index exam 12 years ago in the                            [place] Medicines:                Monitored Anesthesia Care Procedure:                Pre-Anesthesia Assessment:                           - Prior to the procedure, a History and Physical                            was performed, and patient medications and                            allergies were reviewed. The patient's tolerance of                            previous anesthesia was also reviewed. The risks                            and benefits of the procedure and the sedation                            options and risks were discussed with the patient.                            All questions were answered, and informed consent                            was obtained. Prior Anticoagulants: The patient has                            taken no previous anticoagulant or antiplatelet                            agents. ASA Grade Assessment: II - A patient with                            mild systemic disease. After reviewing the risks                            and benefits, the patient was deemed in  satisfactory condition to undergo the procedure.                           After obtaining informed consent, the colonoscope                            was passed under direct vision. Throughout the                            procedure, the patient's blood pressure, pulse, and                            oxygen saturations were monitored continuously. The                            Colonoscope was introduced  through the anus and                            advanced to the the cecum, identified by                            appendiceal orifice and ileocecal valve. The                            ileocecal valve, appendiceal orifice, and rectum                            were photographed. The quality of the bowel                            preparation was excellent. The colonoscopy was                            performed without difficulty. The patient tolerated                            the procedure well. The bowel preparation used was                            SUPREP. Scope In: 2:04:08 PM Scope Out: 2:18:10 PM Scope Withdrawal Time: 0 hours 8 minutes 45 seconds  Total Procedure Duration: 0 hours 14 minutes 2 seconds  Findings:                 A 7 mm polyp was found in the transverse colon. The                            polyp was sessile. The polyp was removed with a                            cold snare. Resection and retrieval were complete.                           The exam was otherwise without abnormality on  direct and retroflexion views. Complications:            No immediate complications. Estimated blood loss:                            None. Estimated Blood Loss:     Estimated blood loss: none. Impression:               - One 7 mm polyp in the transverse colon, removed                            with a cold snare. Resected and retrieved.                           - The examination was otherwise normal on direct                            and retroflexion views. Recommendation:           - Repeat colonoscopy in 5 years for surveillance.                           - Patient has a contact number available for                            emergencies. The signs and symptoms of potential                            delayed complications were discussed with the                            patient. Return to normal activities tomorrow.                             Written discharge instructions were provided to the                            patient.                           - Resume previous diet.                           - Continue present medications.                           - Await pathology results. Docia Chuck. Henrene Pastor, MD 05/14/2018 2:21:55 PM This report has been signed electronically.

## 2018-05-14 NOTE — Progress Notes (Signed)
Report to RN, VSS, adequate respirations noted, no c/o pain or discomfort 

## 2018-05-14 NOTE — Progress Notes (Signed)
Called to room to assist during endoscopic procedure.  Patient ID and intended procedure confirmed with present staff. Received instructions for my participation in the procedure from the performing physician.  

## 2018-05-15 ENCOUNTER — Telehealth: Payer: Self-pay | Admitting: *Deleted

## 2018-05-15 NOTE — Telephone Encounter (Signed)
  Follow up Call-  Call back number 05/14/2018  Post procedure Call Back phone  # [phone] or [phone]  Permission to leave phone message Yes  Some recent data might be hidden     Patient questions:  Do you have a fever, pain , or abdominal swelling? No. Pain Score  0 *  Have you tolerated food without any problems? Yes.    Have you been able to return to your normal activities? Yes.    Do you have any questions about your discharge instructions: Diet   No. Medications  No. Follow up visit  No.  Do you have questions or concerns about your Care? No.  Actions: * If pain score is 4 or above: No action needed, pain <4.

## 2018-05-16 ENCOUNTER — Encounter: Payer: Self-pay | Admitting: Internal Medicine

## 2018-05-21 DIAGNOSIS — M5416 Radiculopathy, lumbar region: Secondary | ICD-10-CM | POA: Diagnosis not present

## 2018-06-10 DIAGNOSIS — M47816 Spondylosis without myelopathy or radiculopathy, lumbar region: Secondary | ICD-10-CM | POA: Diagnosis not present

## 2018-07-22 ENCOUNTER — Ambulatory Visit: Payer: Medicare Other | Admitting: Internal Medicine

## 2018-07-23 ENCOUNTER — Encounter: Payer: Self-pay | Admitting: Internal Medicine

## 2018-07-23 ENCOUNTER — Ambulatory Visit (INDEPENDENT_AMBULATORY_CARE_PROVIDER_SITE_OTHER): Payer: Medicare Other | Admitting: Internal Medicine

## 2018-07-23 VITALS — BP 102/70 | HR 74 | Temp 98.4°F | Wt 273.6 lb

## 2018-07-23 DIAGNOSIS — J069 Acute upper respiratory infection, unspecified: Secondary | ICD-10-CM

## 2018-07-23 DIAGNOSIS — B9789 Other viral agents as the cause of diseases classified elsewhere: Secondary | ICD-10-CM

## 2018-07-23 NOTE — Progress Notes (Signed)
Subjective:    Patient ID: Gordon Phillips, male    DOB: 10-09-1946, 72 y.o.   MRN: [id number]  HPI  72 year old patient who presents with a 5-day history of cough chest congestion and fatigue. Yesterday he was quite symptomatic with fatigue and spent very little time out of bed.  There is been no documented fever.  Today he feels much improved and has been eating and drinking better. He does have essential hypertension which has been treated with triple therapy. Cough is a large nonproductive  Past Medical History:  Diagnosis Date  . ALLERGIC RHINITIS 01/25/2010  . Arthritis    "neck, back, shoulders," (02/20/2017)  . Chronic lower back pain   . High cholesterol   . Hx of cardiovascular stress test    a. Lexiscan Myoview 02/06/13:  EF 59%, no ischemia or scar  . Hx of echocardiogram    a.  Echo 02/03/13:  Mild LVH, mild focal basal septal hypertrophy, EF 50-55%, Gr 1 DD, mild LAE  . HYPERTENSION 01/25/2010  . HYPERTHYROIDISM 01/25/2010   S/P radioactive iodine "back in the 1980s" (02/20/2017)  . Impaired glucose tolerance   . 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 Obesity   . OSA on CPAP    Uses CPAP nightly  . Pericarditis 1980's  . 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  Marland Kitchen Vitiligo      Social History   Socioeconomic History  . Marital status: Married    Spouse name: Not on file  . Number of children: 2  . Years of education: Not on file  . Highest education level: Not on file  Occupational History  . Occupation: Geographical information systems officer  . Financial resource strain: Not on file  . Food insecurity:    Worry: Not on file    Inability: Not on file  . Transportation needs:    Medical: Not on file    Non-medical: Not on file  Tobacco Use  . Smoking status: Former Smoker    Packs/day: 0.50    Years: 8.00    Pack years: 4.00    Types: Cigarettes    Last attempt to quit: 1968    Years since quitting: 51.7  .  Smokeless tobacco: Never Used  Substance and Sexual Activity  . Alcohol use: Yes    Comment: 02/20/2017 "couple drinks/month on average"  . Drug use: No  . Sexual activity: Yes  Lifestyle  . Physical activity:    Days per week: Not on file    Minutes per session: Not on file  . Stress: Not on file  Relationships  . Social connections:    Talks on phone: Not on file    Gets together: Not on file    Attends religious service: Not on file    Active member of club or organization: Not on file    Attends meetings of clubs or organizations: Not on file    Relationship status: Not on file  . Intimate partner violence:    Fear of current or ex partner: Not on file    Emotionally abused: Not on file    Physically abused: Not on file    Forced sexual activity: Not on file  Other Topics Concern  . Not on file  Social History Narrative  . Not on file    Past Surgical History:  Procedure Laterality Date  . INGUINAL HERNIA REPAIR Bilateral   .  LUMBAR LAMINECTOMY    . UMBILICAL HERNIA REPAIR      Family History  Problem Relation Age of Onset  . Stroke Father        Passed away in his 67s  . Thyroid disease Sister        goiter  . Sudden death Brother        ? Drug use, no autopsy  . Throat cancer Brother   . Colon cancer Neg Hx   . Esophageal cancer Neg Hx   . Rectal cancer Neg Hx   . Stomach cancer Neg Hx     Allergies  Allergen Reactions  . Flomax [Tamsulosin Hcl] Itching  . Relpax [Eletriptan]     Chest pain, hypertension, sweating    Current Outpatient Medications on File Prior to Visit  Medication Sig Dispense Refill  . doxazosin (CARDURA) 2 MG tablet TAKE 1 TABLET AT BEDTIME 90 tablet 3  . fluticasone (FLONASE) 50 MCG/ACT nasal spray Place 2 sprays into both nostrils as needed for allergies or rhinitis. 16 g 4  . losartan (COZAAR) 100 MG tablet Take 1 tablet (100 mg total) by mouth daily. 90 tablet 3  . metoprolol succinate (TOPROL-XL) 100 MG 24 hr tablet Take 1  tablet (100 mg total) by mouth daily. Take with or immediately following a meal. 90 tablet 3  . NON FORMULARY 2 tablets daily.    . pravastatin (PRAVACHOL) 40 MG tablet Take 1 tablet (40 mg total) by mouth daily. 90 tablet 3  . sildenafil (VIAGRA) 100 MG tablet Take 100 mg by mouth daily as needed for erectile dysfunction (erectile dysfunction).    . sodium chloride (OCEAN) 0.65 % SOLN nasal spray Place 1 spray into both nostrils daily as needed for congestion.     Current Facility-Administered Medications on File Prior to Visit  Medication Dose Route Frequency Provider Last Rate Last Dose  . 0.9 %  sodium chloride infusion  500 mL Intravenous Once Irene Shipper, MD        BP 102/70 (BP Location: Left Arm, Patient Position: Sitting, Cuff Size: Large)   Pulse 74   Temp 98.4 F (36.9 C) (Oral)   Wt 273 lb 9.6 oz (124.1 kg)   SpO2 98%   BMI 37.11 kg/m     Review of Systems  Constitutional: Positive for activity change, appetite change and fatigue. Negative for chills and fever.  HENT: Negative for congestion, dental problem, ear pain, hearing loss, sore throat, tinnitus, trouble swallowing and voice change.   Eyes: Negative for pain, discharge and visual disturbance.  Respiratory: Positive for cough. Negative for chest tightness, wheezing and stridor.   Cardiovascular: Negative for chest pain, palpitations and leg swelling.  Gastrointestinal: Negative for abdominal distention, abdominal pain, blood in stool, constipation, diarrhea, nausea and vomiting.  Genitourinary: Negative for difficulty urinating, discharge, flank pain, genital sores, hematuria and urgency.  Musculoskeletal: Negative for arthralgias, back pain, gait problem, joint swelling, myalgias and neck stiffness.  Skin: Negative for rash.  Neurological: Negative for dizziness, syncope, speech difficulty, weakness, numbness and headaches.  Hematological: Negative for adenopathy. Does not bruise/bleed easily.    Psychiatric/Behavioral: Negative for behavioral problems and dysphoric mood. The patient is not nervous/anxious.        Objective:   Physical Exam  Constitutional: He is oriented to person, place, and time. He appears well-developed and well-nourished. No distress.  HENT:  Head: Normocephalic.  Right Ear: External ear normal.  Left Ear: External ear normal.  Eyes: Conjunctivae and  EOM are normal.  Neck: Normal range of motion.  Cardiovascular: Normal rate and normal heart sounds.  Pulmonary/Chest: Breath sounds normal.  Slight decreased breath sounds left base but clear  Abdominal: Bowel sounds are normal.  Musculoskeletal: Normal range of motion. He exhibits no edema or tenderness.  Neurological: He is alert and oriented to person, place, and time.  Skin:  Scattered vitiligo  Psychiatric: He has a normal mood and affect. His behavior is normal.          Assessment & Plan:   Viral bronchitis.  Nice clinical improvement over the past 24 hours.  We will continue to force fluids and slowly increase his activity Hypertension.  Blood pressure slightly low today.  Will hold losartan until he feels that he is largely over this acute illness  Follow-up with new PCP as scheduled  Marletta Lor

## 2018-07-23 NOTE — Patient Instructions (Addendum)

## 2018-08-09 NOTE — Progress Notes (Signed)
[place]  [phone] [phone]  Clinic follow Up Note   Patient Care Team: Marletta Lor, MD as PCP - General 08/12/2018  CHIEF COMPLAINTS:  Follow up MGUS  HISTORY OF PRESENTING ILLNESS:  Gordon Phillips 72 y.o. male is here because of abnormal SPEP and suspicious bone lesion on the CT scan.  He had a fall after he tripped and his face hit a wall on the super bowl night on 12/14/2014. He had a significant bleeding on the face and neck pain and was brought to emergency room for further evaluation by 911. CT scan of head and the neck was obtained which showed soft tissue laceration overlying the right orbit, no fracture. The skin incidentally found scattered small lucencies within the visualized osseous structures, with a 6 mm lucency noticed at the C6. He subsequently follow-up with his primary care physician, who ordered the SPEP which showed a restricted band consistent with monoclonal protein, 0.27 g/dl, the quantitative immunoglobulin IgG, IgA and IgM were within normal limits. He developed neck pain which radiate to bilateral shoulders after the accident, this was further evaluated by cervical and lumbar MRI without contrast. The MRI showed diffuse degenerative changes. The low-density lesion in the C5 vertebral body on the CT wasn't not seen in the MRI. His neck pain radiated to b/l shoulders has resolved now.   He had L4-5 disck back surgery 15 years ago in [place], and he has some chronic back pain since then. It has been getting worse in the past few years. He takes Percocet as needed for the back pain. He otherwise feels well, denies any other pain. He works full-time, and remains to be physically active. He denies any fever, chills, or night sweats. No recent weight loss.   CURRENT TREATMENT: Observation  INTERIM HISTORY:  Gordon. Phillips returns for follow-up. He was last seen by me one year ago. He has been following up with Dr. Burnice Logan.    Today, he is here alone at the clinic. He had right leg pain that was treated with an injection. He complains of increased heart rate, but denies palpitations or CP.    MEDICAL HISTORY:  Past Medical History:  Diagnosis Date  . ALLERGIC RHINITIS 01/25/2010  . Arthritis    "neck, back, shoulders," (02/20/2017)  . Chronic lower back pain   . High cholesterol   . Hx of cardiovascular stress test    a. Lexiscan Myoview 02/06/13:  EF 59%, no ischemia or scar  . Hx of echocardiogram    a.  Echo 02/03/13:  Mild LVH, mild focal basal septal hypertrophy, EF 50-55%, Gr 1 DD, mild LAE  . HYPERTENSION 01/25/2010  . HYPERTHYROIDISM 01/25/2010   S/P radioactive iodine "back in the 1980s" (02/20/2017)  . Impaired glucose tolerance   . 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 Obesity   . OSA on CPAP    Uses CPAP nightly  . Pericarditis 1980's  . 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  Marland Kitchen Vitiligo     SURGICAL HISTORY: Past Surgical History:  Procedure Laterality Date  . INGUINAL HERNIA REPAIR Bilateral   . LUMBAR LAMINECTOMY    . UMBILICAL HERNIA REPAIR      SOCIAL HISTORY: Social History   Socioeconomic History  . Marital status: Married    Spouse name: Not on file  . Number of children: 2  . Years of education: Not on  file  . Highest education level: Not on file  Occupational History  . Occupation: Geographical information systems officer  . Financial resource strain: Not on file  . Food insecurity:    Worry: Not on file    Inability: Not on file  . Transportation needs:    Medical: Not on file    Non-medical: Not on file  Tobacco Use  . Smoking status: Former Smoker    Packs/day: 0.50    Years: 8.00    Pack years: 4.00    Types: Cigarettes    Last attempt to quit: 1968    Years since quitting: 51.8  . Smokeless tobacco: Never Used  Substance and Sexual Activity  . Alcohol use: Yes    Comment: 02/20/2017 "couple drinks/month on  average"  . Drug use: No  . Sexual activity: Yes  Lifestyle  . Physical activity:    Days per week: Not on file    Minutes per session: Not on file  . Stress: Not on file  Relationships  . Social connections:    Talks on phone: Not on file    Gets together: Not on file    Attends religious service: Not on file    Active member of club or organization: Not on file    Attends meetings of clubs or organizations: Not on file    Relationship status: Not on file  . Intimate partner violence:    Fear of current or ex partner: Not on file    Emotionally abused: Not on file    Physically abused: Not on file    Forced sexual activity: Not on file  Other Topics Concern  . Not on file  Social History Narrative  . Not on file    FAMILY HISTORY: Family History  Problem Relation Age of Onset  . Stroke Father        Passed away in his 49s  . Thyroid disease Sister        goiter  . Sudden death Brother        ? Drug use, no autopsy  . Throat cancer Brother   . Colon cancer Neg Hx   . Esophageal cancer Neg Hx   . Rectal cancer Neg Hx   . Stomach cancer Neg Hx     ALLERGIES:  is allergic to flomax [tamsulosin hcl] and relpax [eletriptan].  MEDICATIONS:  Current Facility-Administered Medications  Medication Dose Route Frequency Provider Last Rate Last Dose  . aspirin tablet 325 mg  325 mg Oral Daily Harle Stanford., PA-C   325 mg at 08/12/18 1628   No current outpatient medications on file.   Facility-Administered Medications Ordered in Other Visits  Medication Dose Route Frequency Provider Last Rate Last Dose  . 0.9 %  sodium chloride infusion   Intravenous Continuous Rise Patience, MD 10 mL/hr at 08/12/18 2231    . acetaminophen (TYLENOL) tablet 650 mg  650 mg Oral Q6H PRN Rise Patience, MD       Or  . acetaminophen (TYLENOL) suppository 650 mg  650 mg Rectal Q6H PRN Rise Patience, MD      . diltiazem (CARDIZEM) 100 mg in dextrose 5% 165m (1 mg/mL)  infusion  5-15 mg/hr Intravenous Continuous KRise Patience MD 5 mL/hr at 08/12/18 1920 5 mg/hr at 08/12/18 1920  . doxazosin (CARDURA) tablet 2 mg  2 mg Oral QHS KRise Patience MD      . fluticasone ([facility] 50 MCG/ACT nasal spray 2 spray  2 spray Each Nare PRN Rise Patience, MD      . losartan (COZAAR) tablet 100 mg  100 mg Oral QHS Rise Patience, MD      . metoprolol succinate (TOPROL-XL) 24 hr tablet 100 mg  100 mg Oral QHS Rise Patience, MD   100 mg at 08/12/18 2232  . ondansetron (ZOFRAN) tablet 4 mg  4 mg Oral Q6H PRN Rise Patience, MD       Or  . ondansetron [facility]) injection 4 mg  4 mg Intravenous Q6H PRN Rise Patience, MD      . pravastatin (PRAVACHOL) tablet 40 mg  40 mg Oral QHS Rise Patience, MD   40 mg at 08/12/18 2232    REVIEW OF SYSTEMS:   Constitutional: Denies fevers, chills or abnormal night sweats Eyes: Denies blurriness of vision, double vision or watery eyes Ears, nose, mouth, throat, and face: Denies mucositis or sore throat Respiratory: Denies cough, dyspnea or wheezes Cardiovascular: Denies palpitation, chest discomfort or lower extremity swelling (+) increased heart rate Gastrointestinal:  Denies nausea, heartburn or change in bowel habits Skin: Denies abnormal skin rashes (+) vitiligo  Lymphatics: Denies new lymphadenopathy or easy bruising Neurological:Denies numbness, tingling or new weaknesses Behavioral/Psych: Mood is stable, no new changes Musculoskeletal: (+) chronic back pain  All other systems were reviewed with the patient and are negative.  PHYSICAL EXAMINATION: ECOG PERFORMANCE STATUS: 1 - Symptomatic but completely ambulatory  Vitals:   08/12/18 1509  BP: 121/90  Pulse: (!) 127  Resp: 20  Temp: 98 F (36.7 C)  SpO2: 98%   Filed Weights   08/12/18 1509  Weight: 276 lb 3.2 oz (125.3 kg)     GENERAL:alert, no distress and comfortable SKIN: skin color, texture, turgor are normal, no  rashes or significant lesions (+) vitiligo EYES: normal, conjunctiva are pink and non-injected, sclera clear OROPHARYNX:no exudate, no erythema and lips, buccal mucosa, and tongue normal  NECK: supple, thyroid normal size, non-tender, without nodularity LYMPH:  no palpable lymphadenopathy in the cervical, axillary or inguinal LUNGS: clear to auscultation and percussion with normal breathing effort HEART: regular rate & rhythm and no murmurs and no lower extremity edema ABDOMEN:abdomen soft, non-tender and normal bowel sounds Musculoskeletal:no cyanosis of digits and no clubbing  PSYCH: alert & oriented x 3 with fluent speech NEURO: no focal motor/sensory deficits  LABORATORY DATA:  I have reviewed the data as listed CBC Latest Ref Rng & Units 08/12/2018 08/12/2018 02/11/2018  WBC 4.0 - 10.5 K/uL 5.8 5.4 5.0  Hemoglobin 13.0 - 17.0 g/dL 13.0 12.9(L) 13.2  Hematocrit 39.0 - 52.0 % 39.3 39.5 40.3  Platelets 150 - 400 K/uL 168 149 144    CMP Latest Ref Rng & Units 08/12/2018 08/12/2018 02/11/2018  Glucose 70 - 99 mg/dL 71 138(H) 151(H)  BUN 8 - 23 mg/dL '17 16 13  ' Creatinine 0.61 - 1.24 mg/dL 1.11 1.03 1.22  Sodium 135 - 145 mmol/L 145 142 139  Potassium 3.5 - 5.1 mmol/L 4.0 3.9 4.4  Chloride 98 - 111 mmol/L 109 109 105  CO2 22 - 32 mmol/L '28 25 28  ' Calcium 8.9 - 10.3 mg/dL 9.2 9.3 9.5  Total Protein 6.5 - 8.1 g/dL 7.5 7.4 7.3  Total Bilirubin 0.3 - 1.2 mg/dL 1.0 1.1 0.6  Alkaline Phos 38 - 126 U/L 53 62 53  AST 15 - 41 U/L '20 17 18  ' ALT 0 - 44 U/L '20 16 11    ' SPEP/IFE M-protein  05/24/2015: 0.3 07/17/2016: 0.3 01/16/2017: 0.4 08/13/17: 0.5 02/11/18: 0.4  Kappa, lamda light chain and ration 07/17/2016: 13.1, 11.5, 1.14  01/16/2017: 12.1, 9.8, 1.23  08/13/17: 13.7, 10.5, 1.30 02/11/18: 13.7, 9.7, 1.41  PATHOLOGY REPORT  Bone Marrow, Aspirate,Biopsy, and Clot, iliac crest 02/08/2015 - HYPERCELLULAR BONE MARROW FOR AGE WITH TRILINEAGE HEMATOPOIESIS INCLUDING ABUNDANT MEGAKARYOCYTES. - 5%  PLASMA CELLS. - SEE COMMENT. PERIPHERAL BLOOD: - NO SIGNIFICANT MORPHOLOGIC ABNORMALITIES. Diagnosis Note The bone marrow is hypercellular for age with trilineage hematopoiesis including abundant megakaryocytes, some of which display atypical features. The significance of latter finding is uncertain at this time and hence correlation with cytogenetic studies is recommended. The plasma cells represent 5% of all cells in the aspirate associated with kappa light chain excess. Given the relatively small number of plasma cells evaluated, the overall features are considered suggestive but not entirely definitive of plasma cell dyscrasia. Clinical and cytogenetic correlation is recommended. (BNS:ecj 02/10/2015)    RADIOGRAPHIC STUDIES: I have personally reviewed the radiological images as listed and agreed with the findings in the report.   Bone Survey 02/28/2016 IMPRESSION: Stable small lytic lesions within the calvarium. Previously seen lucencies in the cervical spine and left greater trochanter less apparent on today's study. No new lytic lesions  Gordon Cervical Spine Wo Contrast 01/14/2015    IMPRESSION: Negative for cervical spine fracture  Moderate cervical disc degeneration and spondylosis.  C5 vertebral lesion on the left on CT is difficult to visualize on MRI however no worrisome lesion is seen in this area.   Electronically Signed   By: Franchot Gallo M.D.   On: 01/14/2015 08:04   Gordon Lumbar Spine Wo Contrast 01/14/2015    IMPRESSION: Extra foraminal disc contacts the exited left L2 root.  Moderate to moderately severe bilateral foraminal narrowing, worse on the right at L3-4 is due to disc with endplate spurring and facet degenerative disease.  Advanced facet arthropathy at L5-S1 results in 0.4 cm anterolisthesis. There is left much worse than right lateral recess narrowing at this level and moderate to moderately severe bilateral foraminal narrowing.  Moderate to moderately severe bilateral  foraminal narrowing at L4-5 appears worse on the right. The central canal and right lateral recess are also mildly narrowed at this level.   Electronically Signed   By: Inge Rise M.D.   On: 01/14/2015 10:02   Bone survey 02/08/2015 IMPRESSION: Subtle lucencies are noted in the skull, cervical spine, and left femoral greater trochanter. Very subtle changes of myeloma cannot be completely excluded.   ASSESSMENT & PLAN:  72 y.o. African-American male, with past medical history of Graves' disease, vitiligo, hypertension, dyslipidemia, was incidentally found a small hypodense lesion in C5 on CT scan. Lab work reviewed a monoclonal paraprotein.  1. MGUS -His initial SPEP reveal a low-level of monoclonal paraprotein (0.27g/dl), immunoglobulin level were normal, he does not have anemia, renal dysfunction, hypercalcemia on the lab test.  -His previous CT scan revealed a small hyper lytic lesion in the C5 vertebral body however was not obvious on the MRI scan. -A bone survey showed subtle lucencies in multiple bones, indeterminate.  -His bone marrow biopsy showed 5% plasma cells, suggestive of plasma cell dyscrasia. Cytogenetics was normal. -We previously discussed the natural history of MGUS, and small possibility (1% every year) of developing multiple myeloma. I recommend routine follow-up  -I think his back pain and fatigue unlikely related to his MGUS, lumbar spine MRI in March 2016 showed stenosis, no lytic lesions. -We'll continue monitoring. Given  these very stable M protein level,  lab every 6 months -Last M-protein was 0.4 in 02/2018, overall stable. Today's MM labs are pending. CBC showed Hg 12.9 CMP WNLs -since his disease have been stable, I will repeat labs every 6 months and see me once a year  -He knows to contact us with any concerns like high calcium, anemia, or elevated liver enzymes through PCP labs or if he develops any new pain. -f/u in 1 year    3. Mild  thrombocytopenia -The platelet count has been in the range of 130 to 153 in the past 2 years, stable overall. -He does have other autoimmune disease including Graves' disease and vitiligo.  - His bone marrow biopsy showed abundant megakaryocytes, some of which display atypical features, the significance is uncertain. -We'll continue follow-up his CBC. Platelet count normal at 149K today.  4. Chronic back pain  -His lumbar MRI in March 2016 showed severe degenerative changes, which is likely the cause of his back pain. -I strongly encouraged him to follow-up with his sports medicine Dr. Mina Marble  -He hasn't been doing PT due to being busy at work.  -I have recommended pain medication, he declined -I recommended acupuncture or seeing pain specialist. He will think about these options.  -Stable and manageable   5. Fatigue -possible relate to his back pain  -I encouraged him to follow-up with his primary care physician. His TSH was normal.  6. Hypertension, dyslipidemia -He'll continue follow-up with his primary care physician  7. Cancer screening  --pt would like to have a PSA for his next exam and a repeat colonoscopy because he is overdue.   8.  Sinus tachycardia and abnormal EKG -Due to his significant tachycardia, I obtain a EKG during his clinical visit.  It showed a sinus tachycardia, ST change on II, III and aVF. He did have chest pain and cardiac work-up in April 2018, and his EKG change are new since May 2018.  He is clinically asymptomatic. -Tried to contact his cardiologist office, was unsuccessful. -We will send patient to [facility] emergency room to rule out acute coronary syndrome.   Plan -Lab in 6 months  -Lab and f/u in 12 months   -EKG due to tachycardia, and I sent him to [facility] ED due to abnormal EKG    All questions were answered. The patient knows to call the clinic with any problems, questions or concerns.  I spent 25 minutes counseling the patient face to face. The  total time spent in the appointment was 30 minutes and more than 50% was on counseling.  Dierdre Searles Dweik am acting as scribe for Dr. Truitt Merle.  I have reviewed the above documentation for accuracy and completeness, and I agree with the above.      Truitt Merle, MD 08/12/2018

## 2018-08-11 ENCOUNTER — Other Ambulatory Visit: Payer: Self-pay | Admitting: Hematology

## 2018-08-11 DIAGNOSIS — D472 Monoclonal gammopathy: Secondary | ICD-10-CM

## 2018-08-12 ENCOUNTER — Emergency Department (HOSPITAL_COMMUNITY): Payer: Medicare Other

## 2018-08-12 ENCOUNTER — Other Ambulatory Visit: Payer: Self-pay | Admitting: Medical

## 2018-08-12 ENCOUNTER — Encounter: Payer: Self-pay | Admitting: Hematology

## 2018-08-12 ENCOUNTER — Other Ambulatory Visit: Payer: Self-pay

## 2018-08-12 ENCOUNTER — Inpatient Hospital Stay: Payer: Medicare Other

## 2018-08-12 ENCOUNTER — Encounter (HOSPITAL_COMMUNITY): Payer: Self-pay | Admitting: Emergency Medicine

## 2018-08-12 ENCOUNTER — Inpatient Hospital Stay (HOSPITAL_COMMUNITY)
Admission: EM | Admit: 2018-08-12 | Discharge: 2018-08-15 | DRG: 309 | Disposition: A | Discharge status: Home / Self Care | Payer: Medicare Other | Attending: Internal Medicine | Admitting: Internal Medicine

## 2018-08-12 ENCOUNTER — Inpatient Hospital Stay: Payer: Medicare Other | Attending: Hematology | Admitting: Hematology

## 2018-08-12 ENCOUNTER — Encounter (HOSPITAL_COMMUNITY)
Admission: EM | Disposition: A | Discharge status: Home / Self Care | Payer: Self-pay | Source: Home / Self Care | Attending: Internal Medicine

## 2018-08-12 VITALS — BP 121/90 | HR 127 | Temp 98.0°F | Resp 20 | Ht 72.0 in | Wt 276.2 lb

## 2018-08-12 DIAGNOSIS — R079 Chest pain, unspecified: Secondary | ICD-10-CM

## 2018-08-12 DIAGNOSIS — E7800 Pure hypercholesterolemia, unspecified: Secondary | ICD-10-CM | POA: Diagnosis present

## 2018-08-12 DIAGNOSIS — Z87891 Personal history of nicotine dependence: Secondary | ICD-10-CM | POA: Diagnosis not present

## 2018-08-12 DIAGNOSIS — R000 Tachycardia, unspecified: Secondary | ICD-10-CM | POA: Insufficient documentation

## 2018-08-12 DIAGNOSIS — C9000 Multiple myeloma not having achieved remission: Secondary | ICD-10-CM | POA: Diagnosis present

## 2018-08-12 DIAGNOSIS — Z7189 Other specified counseling: Secondary | ICD-10-CM | POA: Diagnosis not present

## 2018-08-12 DIAGNOSIS — E669 Obesity, unspecified: Secondary | ICD-10-CM | POA: Diagnosis not present

## 2018-08-12 DIAGNOSIS — I429 Cardiomyopathy, unspecified: Secondary | ICD-10-CM | POA: Diagnosis not present

## 2018-08-12 DIAGNOSIS — D472 Monoclonal gammopathy: Secondary | ICD-10-CM | POA: Diagnosis present

## 2018-08-12 DIAGNOSIS — I4892 Unspecified atrial flutter: Secondary | ICD-10-CM | POA: Diagnosis not present

## 2018-08-12 DIAGNOSIS — I4891 Unspecified atrial fibrillation: Secondary | ICD-10-CM | POA: Diagnosis present

## 2018-08-12 DIAGNOSIS — I10 Essential (primary) hypertension: Secondary | ICD-10-CM

## 2018-08-12 DIAGNOSIS — J40 Bronchitis, not specified as acute or chronic: Secondary | ICD-10-CM

## 2018-08-12 DIAGNOSIS — E785 Hyperlipidemia, unspecified: Secondary | ICD-10-CM | POA: Diagnosis present

## 2018-08-12 DIAGNOSIS — I248 Other forms of acute ischemic heart disease: Secondary | ICD-10-CM | POA: Diagnosis not present

## 2018-08-12 DIAGNOSIS — N400 Enlarged prostate without lower urinary tract symptoms: Secondary | ICD-10-CM | POA: Diagnosis present

## 2018-08-12 DIAGNOSIS — G4733 Obstructive sleep apnea (adult) (pediatric): Secondary | ICD-10-CM | POA: Diagnosis not present

## 2018-08-12 DIAGNOSIS — Z7982 Long term (current) use of aspirin: Secondary | ICD-10-CM | POA: Diagnosis not present

## 2018-08-12 DIAGNOSIS — I700 Atherosclerosis of aorta: Secondary | ICD-10-CM | POA: Diagnosis not present

## 2018-08-12 DIAGNOSIS — Z23 Encounter for immunization: Secondary | ICD-10-CM | POA: Insufficient documentation

## 2018-08-12 DIAGNOSIS — Z79899 Other long term (current) drug therapy: Secondary | ICD-10-CM | POA: Diagnosis not present

## 2018-08-12 DIAGNOSIS — R9431 Abnormal electrocardiogram [ECG] [EKG]: Secondary | ICD-10-CM | POA: Insufficient documentation

## 2018-08-12 DIAGNOSIS — R7302 Impaired glucose tolerance (oral): Secondary | ICD-10-CM | POA: Diagnosis not present

## 2018-08-12 DIAGNOSIS — Z7951 Long term (current) use of inhaled steroids: Secondary | ICD-10-CM

## 2018-08-12 DIAGNOSIS — G8929 Other chronic pain: Secondary | ICD-10-CM | POA: Diagnosis not present

## 2018-08-12 DIAGNOSIS — Z6834 Body mass index (BMI) 34.0-34.9, adult: Secondary | ICD-10-CM | POA: Diagnosis not present

## 2018-08-12 DIAGNOSIS — I484 Atypical atrial flutter: Secondary | ICD-10-CM | POA: Diagnosis present

## 2018-08-12 DIAGNOSIS — Z8673 Personal history of transient ischemic attack (TIA), and cerebral infarction without residual deficits: Secondary | ICD-10-CM

## 2018-08-12 LAB — APTT: aPTT: 28 seconds (ref 24–36)

## 2018-08-12 LAB — CBC WITH DIFFERENTIAL/PLATELET
Basophils Absolute: 0 10*3/uL (ref 0.0–0.1)
Basophils Relative: 0 %
Eosinophils Absolute: 0.2 10*3/uL (ref 0.0–0.7)
Eosinophils Relative: 3 %
HCT: 39.3 % (ref 39.0–52.0)
Hemoglobin: 13 g/dL (ref 13.0–17.0)
Lymphocytes Relative: 36 %
Lymphs Abs: 2.1 10*3/uL (ref 0.7–4.0)
MCH: 31 pg (ref 26.0–34.0)
MCHC: 33.1 g/dL (ref 30.0–36.0)
MCV: 93.6 fL (ref 78.0–100.0)
Monocytes Absolute: 0.5 10*3/uL (ref 0.1–1.0)
Monocytes Relative: 8 %
Neutro Abs: 3 10*3/uL (ref 1.7–7.7)
Neutrophils Relative %: 53 %
Platelets: 168 10*3/uL (ref 150–400)
RBC: 4.2 MIL/uL — ABNORMAL LOW (ref 4.22–5.81)
RDW: 13.8 % (ref 11.5–15.5)
WBC: 5.8 10*3/uL (ref 4.0–10.5)

## 2018-08-12 LAB — COMPREHENSIVE METABOLIC PANEL
ALT: 20 U/L (ref 0–44)
AST: 20 U/L (ref 15–41)
Albumin: 3.9 g/dL (ref 3.5–5.0)
Alkaline Phosphatase: 53 U/L (ref 38–126)
Anion gap: 8 (ref 5–15)
BUN: 17 mg/dL (ref 8–23)
CO2: 28 mmol/L (ref 22–32)
Calcium: 9.2 mg/dL (ref 8.9–10.3)
Chloride: 109 mmol/L (ref 98–111)
Creatinine, Ser: 1.11 mg/dL (ref 0.61–1.24)
GFR calc Af Amer: >60 mL/min (ref >60)
GFR calc non Af Amer: >60 mL/min (ref >60)
Glucose, Bld: 71 mg/dL (ref 70–99)
Potassium: 4 mmol/L (ref 3.5–5.1)
Sodium: 145 mmol/L (ref 135–145)
Total Bilirubin: 1 mg/dL (ref 0.3–1.2)
Total Protein: 7.5 g/dL (ref 6.5–8.1)

## 2018-08-12 LAB — CBC WITH DIFFERENTIAL (CANCER CENTER ONLY)
Basophils Absolute: 0 10*3/uL (ref 0.0–0.1)
Basophils Relative: 0 %
Eosinophils Absolute: 0.2 10*3/uL (ref 0.0–0.5)
Eosinophils Relative: 3 %
HCT: 39.5 % (ref 38.4–49.9)
Hemoglobin: 12.9 g/dL — ABNORMAL LOW (ref 13.0–17.1)
Lymphocytes Relative: 31 %
Lymphs Abs: 1.7 10*3/uL (ref 0.9–3.3)
MCH: 30.2 pg (ref 27.2–33.4)
MCHC: 32.8 g/dL (ref 32.0–36.0)
MCV: 92.3 fL (ref 79.3–98.0)
Monocytes Absolute: 0.3 10*3/uL (ref 0.1–0.9)
Monocytes Relative: 6 %
Neutro Abs: 3.3 10*3/uL (ref 1.5–6.5)
Neutrophils Relative %: 60 %
Platelet Count: 149 10*3/uL (ref 140–400)
RBC: 4.28 MIL/uL (ref 4.20–5.82)
RDW: 14.2 % (ref 11.0–14.6)
WBC Count: 5.4 10*3/uL (ref 4.0–10.3)

## 2018-08-12 LAB — PROTIME-INR
INR: 0.89
Prothrombin Time: 11.9 seconds (ref 11.4–15.2)

## 2018-08-12 LAB — LIPID PANEL
Cholesterol: 118 mg/dL (ref 0–200)
HDL: 40 mg/dL — ABNORMAL LOW (ref >40)
LDL Cholesterol: 67 mg/dL (ref 0–99)
Total CHOL/HDL Ratio: 3 RATIO
Triglycerides: 56 mg/dL (ref <150)
VLDL: 11 mg/dL (ref 0–40)

## 2018-08-12 LAB — CMP (CANCER CENTER ONLY)
ALT: 16 U/L (ref 0–44)
AST: 17 U/L (ref 15–41)
Albumin: 3.8 g/dL (ref 3.5–5.0)
Alkaline Phosphatase: 62 U/L (ref 38–126)
Anion gap: 8 (ref 5–15)
BUN: 16 mg/dL (ref 8–23)
CO2: 25 mmol/L (ref 22–32)
Calcium: 9.3 mg/dL (ref 8.9–10.3)
Chloride: 109 mmol/L (ref 98–111)
Creatinine: 1.03 mg/dL (ref 0.61–1.24)
GFR, Est AFR Am: >60 mL/min (ref >60)
GFR, Estimated: >60 mL/min (ref >60)
Glucose, Bld: 138 mg/dL — ABNORMAL HIGH (ref 70–99)
Potassium: 3.9 mmol/L (ref 3.5–5.1)
Sodium: 142 mmol/L (ref 135–145)
Total Bilirubin: 1.1 mg/dL (ref 0.3–1.2)
Total Protein: 7.4 g/dL (ref 6.5–8.1)

## 2018-08-12 LAB — D-DIMER, QUANTITATIVE (NOT AT ARMC): D-Dimer, Quant: 0.74 ug/mL-FEU — ABNORMAL HIGH (ref 0.00–0.50)

## 2018-08-12 LAB — URIC ACID: Uric Acid, Serum: 7.4 mg/dL (ref 3.7–8.6)

## 2018-08-12 LAB — MAGNESIUM: Magnesium: 1.9 mg/dL (ref 1.7–2.4)

## 2018-08-12 LAB — TROPONIN I
Troponin I: 0.04 ng/mL (ref <0.03)
Troponin I: 0.04 ng/mL (ref <0.03)

## 2018-08-12 LAB — I-STAT TROPONIN, ED: Troponin i, poc: 0.01 ng/mL (ref 0.00–0.08)

## 2018-08-12 LAB — TSH: TSH: 1.442 u[IU]/mL (ref 0.350–4.500)

## 2018-08-12 SURGERY — LEFT HEART CATH AND CORONARY ANGIOGRAPHY
Anesthesia: LOCAL

## 2018-08-12 MED ORDER — INFLUENZA VAC SPLIT QUAD 0.5 ML IM SUSY
0.5000 mL | PREFILLED_SYRINGE | Freq: Once | INTRAMUSCULAR | Status: AC
  Administered 2018-08-12: 0.5 mL via INTRAMUSCULAR

## 2018-08-12 MED ORDER — ONDANSETRON HCL 4 MG PO TABS: 4.0000 mg | ORAL_TABLET | Freq: Four times a day (QID) | ORAL | Status: DC | PRN

## 2018-08-12 MED ORDER — DILTIAZEM HCL-DEXTROSE 100-5 MG/100ML-% IV SOLN (PREMIX)
5.0000 mg/h | INTRAVENOUS | Status: DC
  Administered 2018-08-12: 5 mg/h via INTRAVENOUS
  Administered 2018-08-13: 7.5 mg/h via INTRAVENOUS
  Administered 2018-08-13: 5 mg/h via INTRAVENOUS
  Administered 2018-08-14: 7.5 mg/h via INTRAVENOUS
  Filled 2018-08-12 (×4): qty 100

## 2018-08-12 MED ORDER — PRAVASTATIN SODIUM 40 MG PO TABS
40.0000 mg | ORAL_TABLET | Freq: Every day | ORAL | Status: DC
  Administered 2018-08-12 – 2018-08-14 (×3): 40 mg via ORAL
  Filled 2018-08-12 (×3): qty 1

## 2018-08-12 MED ORDER — LOSARTAN POTASSIUM 50 MG PO TABS
100.0000 mg | ORAL_TABLET | Freq: Every day | ORAL | Status: DC
  Administered 2018-08-12 – 2018-08-14 (×3): 100 mg via ORAL
  Filled 2018-08-12 (×3): qty 2

## 2018-08-12 MED ORDER — FLUTICASONE PROPIONATE 50 MCG/ACT NA SUSP
2.0000 | NASAL | Status: DC | PRN
  Filled 2018-08-12: qty 16

## 2018-08-12 MED ORDER — ONDANSETRON HCL 4 MG/2ML IJ SOLN: 4.0000 mg | Freq: Four times a day (QID) | INTRAMUSCULAR | Status: DC | PRN

## 2018-08-12 MED ORDER — HEPARIN SODIUM (PORCINE) 5000 UNIT/ML IJ SOLN
4000.0000 [IU] | Freq: Once | INTRAMUSCULAR | Status: AC
  Administered 2018-08-12: 4000 [IU] via INTRAVENOUS
  Filled 2018-08-12: qty 0.8

## 2018-08-12 MED ORDER — ACETAMINOPHEN 325 MG PO TABS: 650.0000 mg | ORAL_TABLET | Freq: Four times a day (QID) | ORAL | Status: DC | PRN

## 2018-08-12 MED ORDER — SODIUM CHLORIDE 0.9 % IV SOLN
INTRAVENOUS | Status: DC
  Administered 2018-08-12 – 2018-08-14 (×3): via INTRAVENOUS

## 2018-08-12 MED ORDER — ASPIRIN 325 MG PO TABS
325.0000 mg | ORAL_TABLET | Freq: Every day | ORAL | Status: DC
  Administered 2018-08-12: 325 mg via ORAL

## 2018-08-12 MED ORDER — DOXAZOSIN MESYLATE 2 MG PO TABS
2.0000 mg | ORAL_TABLET | Freq: Every day | ORAL | Status: DC
  Administered 2018-08-12 – 2018-08-14 (×3): 2 mg via ORAL
  Filled 2018-08-12 (×3): qty 1

## 2018-08-12 MED ORDER — ACETAMINOPHEN 650 MG RE SUPP: 650.0000 mg | Freq: Four times a day (QID) | RECTAL | Status: DC | PRN

## 2018-08-12 MED ORDER — INFLUENZA VAC SPLIT QUAD 0.5 ML IM SUSY
PREFILLED_SYRINGE | INTRAMUSCULAR | Status: AC
  Filled 2018-08-12: qty 0.5

## 2018-08-12 MED ORDER — ASPIRIN 81 MG PO CHEW: 324.0000 mg | CHEWABLE_TABLET | Freq: Once | ORAL | Status: DC

## 2018-08-12 MED ORDER — APIXABAN 5 MG PO TABS
5.0000 mg | ORAL_TABLET | Freq: Two times a day (BID) | ORAL | Status: DC
  Administered 2018-08-13 – 2018-08-15 (×6): 5 mg via ORAL
  Filled 2018-08-12 (×6): qty 1

## 2018-08-12 MED ORDER — ASPIRIN 325 MG PO TABS
ORAL_TABLET | ORAL | Status: AC
  Filled 2018-08-12: qty 1

## 2018-08-12 MED ORDER — DILTIAZEM LOAD VIA INFUSION
20.0000 mg | Freq: Once | INTRAVENOUS | Status: AC
  Administered 2018-08-12: 20 mg via INTRAVENOUS
  Filled 2018-08-12: qty 20

## 2018-08-12 MED ORDER — ASPIRIN 81 MG PO CHEW
81.0000 mg | CHEWABLE_TABLET | Freq: Once | ORAL | Status: AC
  Administered 2018-08-12: 81 mg via ORAL
  Filled 2018-08-12: qty 1

## 2018-08-12 MED ORDER — METOPROLOL SUCCINATE ER 100 MG PO TB24
100.0000 mg | ORAL_TABLET | Freq: Every day | ORAL | Status: DC
  Administered 2018-08-12 – 2018-08-14 (×3): 100 mg via ORAL
  Filled 2018-08-12 (×3): qty 1

## 2018-08-12 MED ORDER — METOPROLOL TARTRATE 5 MG/5ML IV SOLN
2.5000 mg | Freq: Once | INTRAVENOUS | Status: AC
  Administered 2018-08-12: 2.5 mg via INTRAVENOUS
  Filled 2018-08-12: qty 5

## 2018-08-12 NOTE — ED Provider Notes (Signed)
[facility] EMERGENCY DEPARTMENT Provider Note   CSN: [id number] Arrival date & time: 08/12/18  1635     History   Chief Complaint Chief Complaint  Patient presents with  . Tachycardia    HPI Gordon Phillips is a 72 y.o. male.  Patient presents in transfer from [facility] ED re concern possible code stemi. Pt indicates was at outpatient office appointment, when was noted to have high heart rate. Pt indicates has felt fine all day. No chest pain or discomfort. No sob or unusual doe or fatigue. Denies nv or diaphoresis. No leg pain or swelling. No fever or chills. Pt currently denies any acute symptoms.   The history is provided by the patient and the EMS personnel.    Past Medical History:  Diagnosis Date  . ALLERGIC RHINITIS 01/25/2010  . Arthritis    "neck, back, shoulders," (02/20/2017)  . Chronic lower back pain   . High cholesterol   . Hx of cardiovascular stress test    a. Lexiscan Myoview 02/06/13:  EF 59%, no ischemia or scar  . Hx of echocardiogram    a.  Echo 02/03/13:  Mild LVH, mild focal basal septal hypertrophy, EF 50-55%, Gr 1 DD, mild LAE  . HYPERTENSION 01/25/2010  . HYPERTHYROIDISM 01/25/2010   S/P radioactive iodine "back in the 1980s" (02/20/2017)  . Impaired glucose tolerance   . 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 Obesity   . OSA on CPAP    Uses CPAP nightly  . Pericarditis 1980's  . Stroke 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  Marland Kitchen Vitiligo     Patient Active Problem List   Diagnosis Date Noted  . Chest pain, rule out acute myocardial infarction 02/20/2017  . HLD (hyperlipidemia) 02/20/2017  . History of CVA (cerebrovascular accident) 02/20/2017  . Back pain, chronic 11/22/2015  . Morbid obesity ([place]) 05/31/2015  . MGUS (monoclonal gammopathy of unknown significance) 01/21/2015  . OSA (obstructive sleep apnea) 03/16/2014  . Unstable angina ([place]) 02/03/2013  . Impaired glucose  tolerance 05/27/2012  . Thyrotoxicosis 01/25/2010  . Essential hypertension 01/25/2010  . Allergic rhinitis 01/25/2010    Past Surgical History:  Procedure Laterality Date  . INGUINAL HERNIA REPAIR Bilateral   . LUMBAR LAMINECTOMY    . UMBILICAL HERNIA REPAIR          Home Medications    Prior to Admission medications   Medication Sig Start Date End Date Taking? Authorizing Provider  Aromatic Inhalants (VICKS VAPOINHALER) INHA Place 1 puff into both nostrils as needed (for congestion or stuffiness).   Yes [provider]  aspirin (BAYER ASPIRIN) 325 MG tablet Take 325 mg by mouth 3 (three) times a week.   Yes [provider]  AYR SALINE NASAL DROPS NA Place 2 sprays into both nostrils as needed (for congestion).   Yes [provider]  doxazosin (CARDURA) 2 MG tablet TAKE 1 TABLET AT BEDTIME Patient taking differently: Take 2 mg by mouth at bedtime.  04/23/18  Yes Marletta Lor, MD  fluticasone [facility]) 50 MCG/ACT nasal spray Place 2 sprays into both nostrils as needed for allergies or rhinitis. 04/23/18  Yes Marletta Lor, MD  losartan (COZAAR) 100 MG tablet Take 1 tablet (100 mg total) by mouth daily. Patient taking differently: Take 100 mg by mouth at bedtime.  04/23/18  Yes Marletta Lor, MD  metoprolol succinate (TOPROL-XL) 100 MG 24 hr tablet  Take 1 tablet (100 mg total) by mouth daily. Take with or immediately following a meal. Patient taking differently: Take 100 mg by mouth at bedtime. Take with or immediately following a meal. 04/23/18  Yes Marletta Lor, MD  pravastatin (PRAVACHOL) 40 MG tablet Take 1 tablet (40 mg total) by mouth daily. Patient taking differently: Take 40 mg by mouth at bedtime.  04/23/18  Yes Marletta Lor, MD  PRESCRIPTION MEDICATION CPAP: At bedtime   Yes [provider]  sildenafil (VIAGRA) 100 MG tablet Take 100 mg by mouth daily as needed for erectile dysfunction.    Yes [provider]    Family History Family History  Problem Relation Age of Onset  . Stroke Father        Passed away in his 32s  . Thyroid disease Sister        goiter  . Sudden death Brother        ? Drug use, no autopsy  . Throat cancer Brother   . Colon cancer Neg Hx   . Esophageal cancer Neg Hx   . Rectal cancer Neg Hx   . Stomach cancer Neg Hx     Social History Social History   Tobacco Use  . Smoking status: Former Smoker    Packs/day: 0.50    Years: 8.00    Pack years: 4.00    Types: Cigarettes    Last attempt to quit: 1968    Years since quitting: 51.8  . Smokeless tobacco: Never Used  Substance Use Topics  . Alcohol use: Yes    Comment: 02/20/2017 "couple drinks/month on average"  . Drug use: No     Allergies   Flomax [tamsulosin hcl] and Relpax [eletriptan]   Review of Systems Review of Systems  Constitutional: Negative for chills and fever.  HENT: Negative for sore throat.   Eyes: Negative for redness.  Respiratory: Negative for shortness of breath.   Cardiovascular: Negative for chest pain, palpitations and leg swelling.  Gastrointestinal: Negative for abdominal pain, diarrhea and vomiting.  Genitourinary: Negative for flank pain.  Musculoskeletal: Negative for back pain and neck pain.       Right great toe pain for several days. No hx gout. No injury to toe.   Skin: Negative for rash.  Neurological: Negative for headaches.  Hematological: Does not bruise/bleed easily.  Psychiatric/Behavioral: Negative for confusion.     Physical Exam Updated Vital Signs BP (!) 163/116   Pulse (!) 123   Temp 97.6 F (36.4 C) (Oral)   Resp 19   SpO2 98%   Physical Exam  Constitutional: He appears well-developed and well-nourished.  HENT:  Mouth/Throat: Oropharynx is clear and moist.  Eyes: Conjunctivae are normal.  Neck: Neck supple. No tracheal deviation present. No thyromegaly present.  Cardiovascular: Regular rhythm, normal heart sounds and intact  distal pulses. Exam reveals no gallop and no friction rub.  No murmur heard. Tachycardic.   Pulmonary/Chest: Effort normal and breath sounds normal. No accessory muscle usage. No respiratory distress.  Abdominal: Soft. Bowel sounds are normal. He exhibits no distension. There is no tenderness.  Genitourinary:  Genitourinary Comments: No cva tenderness  Musculoskeletal: He exhibits no edema.  Right great toe/mcp region mild sts, warm, tenderness - felt most c/w gout/inflamm arthritis. Normal cap refill distally.   Neurological: He is alert.  Skin: Skin is warm and dry. No rash noted.  Psychiatric: He has a normal mood and affect.  Nursing note and vitals reviewed.  ED Treatments / Results  Labs (all labs ordered are listed, but only abnormal results are displayed) Results for orders placed or performed during the hospital encounter of 08/12/18  CBC with Differential/Platelet  Result Value Ref Range   WBC 5.8 4.0 - 10.5 K/uL   RBC 4.20 (L) 4.22 - 5.81 MIL/uL   Hemoglobin 13.0 13.0 - 17.0 g/dL   HCT 39.3 39.0 - 52.0 %   MCV 93.6 78.0 - 100.0 fL   MCH 31.0 26.0 - 34.0 pg   MCHC 33.1 30.0 - 36.0 g/dL   RDW 13.8 11.5 - 15.5 %   Platelets 168 150 - 400 K/uL   Neutrophils Relative % 53 %   Neutro Abs 3.0 1.7 - 7.7 K/uL   Lymphocytes Relative 36 %   Lymphs Abs 2.1 0.7 - 4.0 K/uL   Monocytes Relative 8 %   Monocytes Absolute 0.5 0.1 - 1.0 K/uL   Eosinophils Relative 3 %   Eosinophils Absolute 0.2 0.0 - 0.7 K/uL   Basophils Relative 0 %   Basophils Absolute 0.0 0.0 - 0.1 K/uL  Protime-INR  Result Value Ref Range   Prothrombin Time 11.9 11.4 - 15.2 seconds   INR 0.89   APTT  Result Value Ref Range   aPTT 28 24 - 36 seconds  Comprehensive metabolic panel  Result Value Ref Range   Sodium 145 135 - 145 mmol/L   Potassium 4.0 3.5 - 5.1 mmol/L   Chloride 109 98 - 111 mmol/L   CO2 28 22 - 32 mmol/L   Glucose, Bld 71 70 - 99 mg/dL   BUN 17 8 - 23 mg/dL   Creatinine, Ser 1.11  0.61 - 1.24 mg/dL   Calcium 9.2 8.9 - 10.3 mg/dL   Total Protein 7.5 6.5 - 8.1 g/dL   Albumin 3.9 3.5 - 5.0 g/dL   AST 20 15 - 41 U/L   ALT 20 0 - 44 U/L   Alkaline Phosphatase 53 38 - 126 U/L   Total Bilirubin 1.0 0.3 - 1.2 mg/dL   GFR calc non Af Amer >60 >60 mL/min   GFR calc Af Amer >60 >60 mL/min   Anion gap 8 5 - 15  Troponin I  Result Value Ref Range   Troponin I 0.04 (HH) <0.03 ng/mL  Lipid panel  Result Value Ref Range   Cholesterol 118 0 - 200 mg/dL   Triglycerides 56 <150 mg/dL   HDL 40 (L) >40 mg/dL   Total CHOL/HDL Ratio 3.0 RATIO   VLDL 11 0 - 40 mg/dL   LDL Cholesterol 67 0 - 99 mg/dL  I-Stat Troponin, ED (not at [facility])  Result Value Ref Range   Troponin i, poc 0.01 0.00 - 0.08 ng/mL   Comment 3           Dg Chest Port 1 View  Result Date: 08/12/2018 CLINICAL DATA:  Elevated protein levels and ST elevations on EKG. EXAM: PORTABLE CHEST 1 VIEW COMPARISON:  02/20/2017 FINDINGS: Borderline cardiomegaly with mild aortic atherosclerosis. No aortic aneurysm. No pulmonary consolidation or CHF. No effusion or pneumothorax. IMPRESSION: No active disease. Electronically Signed   By: Ashley Royalty M.D.   On: 08/12/2018 18:33    EKG EKG Interpretation  Date/Time:  Monday August 12 2018 17:19:30 EDT Ventricular Rate:  122 PR Interval:    QRS Duration: 90 QT Interval:  311 QTC Calculation: 443 R Axis:   69 Text Interpretation:  Atrial flutter Non-specific ST-t changes Confirmed by Lajean Saver [phone]) on  08/12/2018 6:55:54 PM   Radiology Dg Chest Port 1 View  Result Date: 08/12/2018 CLINICAL DATA:  Elevated protein levels and ST elevations on EKG. EXAM: PORTABLE CHEST 1 VIEW COMPARISON:  02/20/2017 FINDINGS: Borderline cardiomegaly with mild aortic atherosclerosis. No aortic aneurysm. No pulmonary consolidation or CHF. No effusion or pneumothorax. IMPRESSION: No active disease. Electronically Signed   By: Ashley Royalty M.D.   On: 08/12/2018 18:33     Procedures Procedures (including critical care time)  Medications Ordered in ED Medications  0.9 %  sodium chloride infusion ( Intravenous New Bag/Given 08/12/18 1700)  heparin injection 4,000 Units (4,000 Units Intravenous Given 08/12/18 1659)  metoprolol tartrate (LOPRESSOR) injection 2.5 mg (2.5 mg Intravenous Given 08/12/18 1653)  aspirin chewable tablet 81 mg (81 mg Oral Given 08/12/18 1653)     Initial Impression / Assessment and Plan / ED Course  I have reviewed the triage vital signs and the nursing notes.  Pertinent labs & imaging results that were available during my care of the patient were reviewed by me and considered in my medical decision making (see chart for details).  Iv ns. Continuous pulse ox and monitor. Labs.  Reviewed nursing notes and prior charts for additional history.   On review of ecg from [facility] - does not appear c/w stemi, does appear c/w a flutter.   cardizem iv bolus/gtt.  Cardiology did evaluate in ED and recommends admit to med service.   Hospitalists consulted for admission.     Final Clinical Impressions(s) / ED Diagnoses   Final diagnoses:  Acute ST elevation myocardial infarction (STEMI), unspecified artery [facility])    ED Discharge Orders    None       Lajean Saver, MD 08/12/18 1856

## 2018-08-12 NOTE — ED Notes (Signed)
CODE STEMI CALLED.

## 2018-08-12 NOTE — ED Notes (Signed)
Bed: SY54 Expected date:  Expected time:  Means of arrival:  Comments: [facility]- abnormal protein, ST elevation, 1st degree block

## 2018-08-12 NOTE — ED Notes (Signed)
Pt arrived via [facility] from [facility]. Pt was seen at cancer center today to have blood work drawn r/t concern for multiple myeloma. Pt EKG done at [facility] showed MI, sent to [facility] ED, All lab work drawn at [facility]. Pt denies CP, denies tightness, denies back pain, denies dyspnea, given 324 ASA at [facility] ED, given some ASA at cancer center and pt reports taking some at home today as well. Pt also given 5000 heparin PTA.   Pt in room speaking in full sentences at this time with Cardiology.  

## 2018-08-12 NOTE — ED Triage Notes (Signed)
Patient presents from [place] center. Patient being treated by [facility] for elevated protein levels. Patient went to [facility] for routine labs today and was found to have ST elevation in EKG. Patient denies CP/SOB. Patient asymptomatic. Patient brought to ED for further evaluation. Dr Zenia Resides called to room. EKG completed in triage.

## 2018-08-12 NOTE — H&P (Addendum)
History and Physical    Gordon Phillips WER:[id number] DOB: [birth year] DOA: 08/12/2018  PCP: Marletta Lor, MD  Patient coming from: Home.  Chief Complaint: Tachycardia.  HPI: Gordon Phillips is a 72 y.o. male with history of hypertension, MGUS, hyperlipidemia and chronic low back pain was noticed to be tachycardic at the hematology clinic patient followed up this afternoon and was referred to the ER at [facility] long.  Patient states when he went to get his injection done for his low back last week he was tachycardic at that time.  And the time injection was discontinued.  He denies any palpitations or chest pain shortness of breath fever chills.  Has been having some respiratory symptoms and has been treated for bronchitis by his primary care physician symptomatically.  Denies taking any Sudafed or phenylephrine.    ED Course: In the ER at [facility] long there was concern for ST elevation in inferior leads and cardiology was consulted and patient transferred to [facility].  As per cardiology patient EKG showing atrial flutter with RVR and was started on Cardizem infusion and also recommended starting anticoagulation.  Patient on my exam denies any chest pain heart rate is improved with Cardizem infusion.  Still in atrial flutter.  Review of Systems: As per HPI, rest all negative.   Past Medical History:  Diagnosis Date  . ALLERGIC RHINITIS 01/25/2010  . Arthritis    "neck, back, shoulders," (02/20/2017)  . Chronic lower back pain   . High cholesterol   . Hx of cardiovascular stress test    a. Lexiscan Myoview 02/06/13:  EF 59%, no ischemia or scar  . Hx of echocardiogram    a.  Echo 02/03/13:  Mild LVH, mild focal basal septal hypertrophy, EF 50-55%, Gr 1 DD, mild LAE  . HYPERTENSION 01/25/2010  . HYPERTHYROIDISM 01/25/2010   S/P radioactive iodine "back in the 1980s" (02/20/2017)  . Impaired glucose tolerance   . MGUS (monoclonal gammopathy of unknown significance)    "an autoimmune thing"    . Obesity   . OSA on CPAP    Uses CPAP nightly  . Pericarditis 1980's  . 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  Marland Kitchen Vitiligo     Past Surgical History:  Procedure Laterality Date  . INGUINAL HERNIA REPAIR Bilateral   . LUMBAR LAMINECTOMY    . UMBILICAL HERNIA REPAIR       reports that he quit smoking about 51 years ago. His smoking use included cigarettes. He has a 4.00 pack-year smoking history. He has never used smokeless tobacco. He reports that he drinks alcohol. He reports that he does not use drugs.  Allergies  Allergen Reactions  . Flomax [Tamsulosin Hcl] Itching  . Relpax [Eletriptan] Other (See Comments) and Hypertension    Chest pain and sweating    Family History  Problem Relation Age of Onset  . Stroke Father        Passed away in his 97s  . Thyroid disease Sister        goiter  . Sudden death Brother        ? Drug use, no autopsy  . Throat cancer Brother   . Colon cancer Neg Hx   . Esophageal cancer Neg Hx   . Rectal cancer Neg Hx   . Stomach cancer Neg Hx     Prior to Admission medications   Medication Sig Start Date End Date Taking? Authorizing Provider  Aromatic Inhalants (VICKS  VAPOINHALER) INHA Place 1 puff into both nostrils as needed (for congestion or stuffiness).   Yes [provider]  aspirin (BAYER ASPIRIN) 325 MG tablet Take 325 mg by mouth 3 (three) times a week.   Yes [provider]  AYR SALINE NASAL DROPS NA Place 2 sprays into both nostrils as needed (for congestion).   Yes [provider]  doxazosin (CARDURA) 2 MG tablet TAKE 1 TABLET AT BEDTIME Patient taking differently: Take 2 mg by mouth at bedtime.  04/23/18  Yes Marletta Lor, MD  fluticasone [facility]) 50 MCG/ACT nasal spray Place 2 sprays into both nostrils as needed for allergies or rhinitis. 04/23/18  Yes Marletta Lor, MD  losartan (COZAAR) 100 MG tablet Take 1 tablet (100 mg total) by mouth daily. Patient taking  differently: Take 100 mg by mouth at bedtime.  04/23/18  Yes Marletta Lor, MD  metoprolol succinate (TOPROL-XL) 100 MG 24 hr tablet Take 1 tablet (100 mg total) by mouth daily. Take with or immediately following a meal. Patient taking differently: Take 100 mg by mouth at bedtime. Take with or immediately following a meal. 04/23/18  Yes Marletta Lor, MD  pravastatin (PRAVACHOL) 40 MG tablet Take 1 tablet (40 mg total) by mouth daily. Patient taking differently: Take 40 mg by mouth at bedtime.  04/23/18  Yes Marletta Lor, MD  PRESCRIPTION MEDICATION CPAP: At bedtime   Yes [provider]  sildenafil (VIAGRA) 100 MG tablet Take 100 mg by mouth daily as needed for erectile dysfunction.    Yes [provider]    Physical Exam: Vitals:   08/12/18 1930 08/12/18 1931 08/12/18 1932 08/12/18 1945  BP: 126/84 126/84  128/81  Pulse:  76  60  Resp:  18  (!) 21  Temp:      TempSrc:      SpO2: 98% 97% 97% 98%  Weight:      Height:          Constitutional: Moderately built and nourished. Vitals:   08/12/18 1930 08/12/18 1931 08/12/18 1932 08/12/18 1945  BP: 126/84 126/84  128/81  Pulse:  76  60  Resp:  18  (!) 21  Temp:      TempSrc:      SpO2: 98% 97% 97% 98%  Weight:      Height:       Eyes: Anicteric no pallor. ENMT: No discharge from the ears eyes nose or mouth. Neck: No mass felt.  No neck rigidity.  No JVD appreciated. Respiratory: No rhonchi or crepitations. Cardiovascular: S1-S2 heard no murmurs appreciated. Abdomen: Soft nontender bowel sounds present. Musculoskeletal: No edema.  No joint effusion. Skin: No rash. Neurologic: Alert awake oriented to time place and person.  Moves all extremities. Psychiatric: Appears normal per normal affect.   Labs on Admission: I have personally reviewed following labs and imaging studies  CBC: Recent Labs  Lab 08/12/18 1443 08/12/18 1642  WBC 5.4 5.8  NEUTROABS 3.3 3.0  HGB 12.9* 13.0  HCT  39.5 39.3  MCV 92.3 93.6  PLT 149 740   Basic Metabolic Panel: Recent Labs  Lab 08/12/18 1443 08/12/18 1642  NA 142 145  K 3.9 4.0  CL 109 109  CO2 25 28  GLUCOSE 138* 71  BUN 16 17  CREATININE 1.03 1.11  CALCIUM 9.3 9.2   GFR: Estimated Creatinine Clearance: 84.3 mL/min (by C-G formula based on SCr of 1.11 mg/dL). Liver Function Tests: Recent Labs  Lab 08/12/18  1443 08/12/18 1642  AST 17 20  ALT 16 20  ALKPHOS 62 53  BILITOT 1.1 1.0  PROT 7.4 7.5  ALBUMIN 3.8 3.9   No results for input(s): LIPASE, AMYLASE in the last 168 hours. No results for input(s): AMMONIA in the last 168 hours. Coagulation Profile: Recent Labs  Lab 08/12/18 1642  INR 0.89   Cardiac Enzymes: Recent Labs  Lab 08/12/18 1642  TROPONINI 0.04*   BNP (last 3 results) No results for input(s): PROBNP in the last 8760 hours. HbA1C: No results for input(s): HGBA1C in the last 72 hours. CBG: No results for input(s): GLUCAP in the last 168 hours. Lipid Profile: Recent Labs    08/12/18 1642  CHOL 118  HDL 40*  LDLCALC 67  TRIG 56  CHOLHDL 3.0   Thyroid Function Tests: No results for input(s): TSH, T4TOTAL, FREET4, T3FREE, THYROIDAB in the last 72 hours. Anemia Panel: No results for input(s): VITAMINB12, FOLATE, FERRITIN, TIBC, IRON, RETICCTPCT in the last 72 hours. Urine analysis:    Component Value Date/Time   COLORURINE YELLOW 02/03/2013 0140   APPEARANCEUR CLEAR 02/03/2013 0140   LABSPEC 1.023 02/03/2013 0140   PHURINE 7.0 02/03/2013 0140   GLUCOSEU NEGATIVE 02/03/2013 0140   HGBUR NEGATIVE 02/03/2013 0140   BILIRUBINUR n 04/27/2014 1123   KETONESUR NEGATIVE 02/03/2013 0140   PROTEINUR n 04/27/2014 1123   PROTEINUR NEGATIVE 02/03/2013 0140   UROBILINOGEN 0.2 04/27/2014 1123   UROBILINOGEN 1.0 02/03/2013 0140   NITRITE n 04/27/2014 1123   NITRITE NEGATIVE 02/03/2013 0140   LEUKOCYTESUR Negative 04/27/2014 1123   Sepsis Labs: @LABRCNTIP (procalcitonin:4,lacticidven:4) )No  results found for this or any previous visit (from the past 240 hour(s)).   Radiological Exams on Admission: Dg Chest Port 1 View  Result Date: 08/12/2018 CLINICAL DATA:  Elevated protein levels and ST elevations on EKG. EXAM: PORTABLE CHEST 1 VIEW COMPARISON:  02/20/2017 FINDINGS: Borderline cardiomegaly with mild aortic atherosclerosis. No aortic aneurysm. No pulmonary consolidation or CHF. No effusion or pneumothorax. IMPRESSION: No active disease. Electronically Signed   By: Ashley Royalty M.D.   On: 08/12/2018 18:33    EKG: Independently reviewed.  Atrial flutter with RVR.  Assessment/Plan Principal Problem:   Atrial flutter with rapid ventricular response (HCC) Active Problems:   Essential hypertension   OSA (obstructive sleep apnea)   MGUS (monoclonal gammopathy of unknown significance)   History of CVA (cerebrovascular accident)    1. Atrial flutter with RVR -appreciate cardiology consult.  Patient on Cardizem infusion and patient has not taken his metoprolol which has been ordered.  Check 2D echo cycle cardiac markers check TSH.  Chads 2 vasc score is around 4 for which after discussing with patient started on apixaban as recommended by cardiology. 2. Elevated troponin likely related to heart rate.  We will cycle cardiac markers patient is on apixaban and check 2D echo.  Denies any chest pain. 3. Hypertension on metoprolol and Cozaar.  Presently on Cardizem infusion. 4. Sleep apnea on CPAP. 5. Chronic low back pain gets epidural as outpatient. 6. Hyperlipidemia on statins. 7. MGUS being followed by oncologist. 8. BPH on Cardura. 9. Recently treated for bronchitis presently not wheezing.   DVT prophylaxis: Apixaban. Code Status: Full code. Family Communication: Discussed with patient. Disposition Plan: Home. Consults called: Cardiology. Admission status: Observation.   Rise Patience MD [facility] Pager [phone].  If 7PM-7AM, please contact  night-coverage [email or web address] Password [email or web address]  08/12/2018, 7:55 PM

## 2018-08-12 NOTE — Progress Notes (Signed)
ANTICOAGULATION CONSULT NOTE - Initial Consult  Pharmacy Consult for Apixaban Indication: atrial fibrillation  Allergies  Allergen Reactions  . Flomax [Tamsulosin Hcl] Itching  . Relpax [Eletriptan] Other (See Comments) and Hypertension    Chest pain and sweating    Patient Measurements: Height: 6\' 2"  (188 cm) Weight: 272 lb 3.2 oz (123.5 kg) IBW/kg (Calculated) : 82.2  Vital Signs: Temp: 98.1 F (36.7 C) (10/07 2043) Temp Source: Oral (10/07 2043) BP: 100/64 (10/07 2232) Pulse Rate: 92 (10/07 2232)  Labs: Recent Labs    08/12/18 1443 08/12/18 1642 08/12/18 2112  HGB 12.9* 13.0  --   HCT 39.5 39.3  --   PLT 149 168  --   APTT  --  28  --   LABPROT  --  11.9  --   INR  --  0.89  --   CREATININE 1.03 1.11  --   TROPONINI  --  0.04* 0.04*    Estimated Creatinine Clearance: 85.2 mL/min (by C-G formula based on SCr of 1.11 mg/dL).   Medical History: Past Medical History:  Diagnosis Date  . ALLERGIC RHINITIS 01/25/2010  . Arthritis    "neck, back, shoulders," (02/20/2017)  . Chronic lower back pain   . High cholesterol   . Hx of cardiovascular stress test    a. Lexiscan Myoview 02/06/13:  EF 59%, no ischemia or scar  . Hx of echocardiogram    a.  Echo 02/03/13:  Mild LVH, mild focal basal septal hypertrophy, EF 50-55%, Gr 1 DD, mild LAE  . HYPERTENSION 01/25/2010  . HYPERTHYROIDISM 01/25/2010   S/P radioactive iodine "back in the 1980s" (02/20/2017)  . Impaired glucose tolerance   . 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 Obesity   . OSA on CPAP    Uses CPAP nightly  . Pericarditis 1980's  . 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  Marland Kitchen Vitiligo     Medications:  Facility-Administered Medications Prior to Admission  Medication Dose Route Frequency Provider Last Rate Last Dose  . 0.9 %  sodium chloride infusion  500 mL Intravenous Once Irene Shipper, MD       Medications Prior to Admission  Medication  Sig Dispense Refill Last Dose  . Aromatic Inhalants (VICKS VAPOINHALER) INHA Place 1 puff into both nostrils as needed (for congestion or stuffiness).   Past Month at Unknown time  . aspirin (BAYER ASPIRIN) 325 MG tablet Take 325 mg by mouth 3 (three) times a week.   Past Week at Unknown time  . AYR SALINE NASAL DROPS NA Place 2 sprays into both nostrils as needed (for congestion).   08/11/2018 at Unknown time  . doxazosin (CARDURA) 2 MG tablet TAKE 1 TABLET AT BEDTIME (Patient taking differently: Take 2 mg by mouth at bedtime. ) 90 tablet 3 08/11/2018 at pm  . fluticasone (FLONASE) 50 MCG/ACT nasal spray Place 2 sprays into both nostrils as needed for allergies or rhinitis. 16 g 4 Unk at Unk  . losartan (COZAAR) 100 MG tablet Take 1 tablet (100 mg total) by mouth daily. (Patient taking differently: Take 100 mg by mouth at bedtime. ) 90 tablet 3 08/11/2018 at pm  . metoprolol succinate (TOPROL-XL) 100 MG 24 hr tablet Take 1 tablet (100 mg total) by mouth daily. Take with or immediately following a meal. (Patient taking differently: Take 100 mg by mouth at bedtime. Take with or immediately following a meal.) 90  tablet 3 08/11/2018 at 2000  . pravastatin (PRAVACHOL) 40 MG tablet Take 1 tablet (40 mg total) by mouth daily. (Patient taking differently: Take 40 mg by mouth at bedtime. ) 90 tablet 3 08/11/2018 at pm  . PRESCRIPTION MEDICATION CPAP: At bedtime   08/11/2018 at pm  . sildenafil (VIAGRA) 100 MG tablet Take 100 mg by mouth daily as needed for erectile dysfunction.    Unk at [facility]    Assessment: 72 y.o. M presents with tachycardia. Found to have aflutter with CHADSVASC=4. Cardiology recommends apixaban or rivaroxaban. May need cardioversion. Pharmacy consulted to start apixaban. CBC ok at baseline.  Goal of Therapy:  Prevention of CVA Monitor platelets by anticoagulation protocol: Yes   Plan:  Apixaban 5mg  po BID Consider transitions of care pharmacy for discharge apixaban  Sherlon Handing, PharmD,  BCPS Clinical pharmacist  **Pharmacist phone directory can now be found on [email or web address] ([email or web address]).  Listed under [place]. 08/12/2018,11:41 PM

## 2018-08-12 NOTE — ED Provider Notes (Addendum)
[facility] DEPT Provider Note   CSN: [id number] Arrival date & time: 08/12/18  1635     History   Chief Complaint Chief Complaint  Patient presents with  . Code STEMI    HPI Gordon Phillips is a 72 y.o. male.  72 year old male presents with cancer center with concern for ST elevation infarction.  Patient has a history of multiple myeloma and was here for routine follow-up visit when patient was found to be tachycardic.  EKG showed ST elevation 2 3 and aVF.  Patient denies any chest pain or shortness of breath.  He denies any syncope or near syncope.  Has had palpitations for several days.  Denies any volume loss.  Patient given aspirin at the cancer center.  On arrival here he feels at his baseline.     Past Medical History:  Diagnosis Date  . ALLERGIC RHINITIS 01/25/2010  . Arthritis    "neck, back, shoulders," (02/20/2017)  . Chronic lower back pain   . High cholesterol   . Hx of cardiovascular stress test    a. Lexiscan Myoview 02/06/13:  EF 59%, no ischemia or scar  . Hx of echocardiogram    a.  Echo 02/03/13:  Mild LVH, mild focal basal septal hypertrophy, EF 50-55%, Gr 1 DD, mild LAE  . HYPERTENSION 01/25/2010  . HYPERTHYROIDISM 01/25/2010   S/P radioactive iodine "back in the 1980s" (02/20/2017)  . Impaired glucose tolerance   . 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 Obesity   . OSA on CPAP    Uses CPAP nightly  . Pericarditis 1980's  . 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  Marland Kitchen Vitiligo     Patient Active Problem List   Diagnosis Date Noted  . Chest pain, rule out acute myocardial infarction 02/20/2017  . HLD (hyperlipidemia) 02/20/2017  . History of CVA (cerebrovascular accident) 02/20/2017  . Back pain, chronic 11/22/2015  . Morbid obesity ([place]) 05/31/2015  . MGUS (monoclonal gammopathy of unknown significance) 01/21/2015  . OSA (obstructive sleep apnea) 03/16/2014  .  Unstable angina ([place]) 02/03/2013  . Impaired glucose tolerance 05/27/2012  . Thyrotoxicosis 01/25/2010  . Essential hypertension 01/25/2010  . Allergic rhinitis 01/25/2010    Past Surgical History:  Procedure Laterality Date  . INGUINAL HERNIA REPAIR Bilateral   . LUMBAR LAMINECTOMY    . UMBILICAL HERNIA REPAIR          Home Medications    Prior to Admission medications   Medication Sig Start Date End Date Taking? Authorizing Provider  doxazosin (CARDURA) 2 MG tablet TAKE 1 TABLET AT BEDTIME 04/23/18   Marletta Lor, MD  fluticasone [facility]) 50 MCG/ACT nasal spray Place 2 sprays into both nostrils as needed for allergies or rhinitis. 04/23/18   Marletta Lor, MD  losartan (COZAAR) 100 MG tablet Take 1 tablet (100 mg total) by mouth daily. 04/23/18   Marletta Lor, MD  metoprolol succinate (TOPROL-XL) 100 MG 24 hr tablet Take 1 tablet (100 mg total) by mouth daily. Take with or immediately following a meal. 04/23/18   Marletta Lor, MD  NON FORMULARY 2 tablets daily.    [provider]  pravastatin (PRAVACHOL) 40 MG tablet Take 1 tablet (40 mg total) by mouth daily. 04/23/18   Marletta Lor, MD  sildenafil (VIAGRA) 100 MG tablet Take 100 mg by mouth daily as needed for erectile dysfunction (erectile dysfunction).  [provider]  sodium chloride (OCEAN) 0.65 % SOLN nasal spray Place 1 spray into both nostrils daily as needed for congestion.    [provider]    Family History Family History  Problem Relation Age of Onset  . Stroke Father        Passed away in his 71s  . Thyroid disease Sister        goiter  . Sudden death Brother        ? Drug use, no autopsy  . Throat cancer Brother   . Colon cancer Neg Hx   . Esophageal cancer Neg Hx   . Rectal cancer Neg Hx   . Stomach cancer Neg Hx     Social History Social History   Tobacco Use  . Smoking status: Former Smoker    Packs/day: 0.50    Years: 8.00     Pack years: 4.00    Types: Cigarettes    Last attempt to quit: 1968    Years since quitting: 51.8  . Smokeless tobacco: Never Used  Substance Use Topics  . Alcohol use: Yes    Comment: 02/20/2017 "couple drinks/month on average"  . Drug use: No     Allergies   Flomax [tamsulosin hcl] and Relpax [eletriptan]   Review of Systems Review of Systems  All other systems reviewed and are negative.    Physical Exam Updated Vital Signs BP (!) 175/118 (BP Location: Left Arm) Comment: informed the nurse  Pulse (!) 123   Temp 97.6 F (36.4 C) (Oral)   Resp (!) 33   SpO2 98%   Physical Exam  Constitutional: He is oriented to person, place, and time. He appears well-developed and well-nourished.  Non-toxic appearance. No distress.  HENT:  Head: Normocephalic and atraumatic.  Eyes: Pupils are equal, round, and reactive to light. Conjunctivae, EOM and lids are normal.  Neck: Normal range of motion. Neck supple. No tracheal deviation present. No thyroid mass present.  Cardiovascular: Regular rhythm and normal heart sounds. Tachycardia present. Exam reveals no gallop.  No murmur heard. Pulmonary/Chest: Effort normal and breath sounds normal. No stridor. No respiratory distress. He has no decreased breath sounds. He has no wheezes. He has no rhonchi. He has no rales.  Abdominal: Soft. Normal appearance and bowel sounds are normal. He exhibits no distension. There is no tenderness. There is no rebound and no CVA tenderness.  Musculoskeletal: Normal range of motion. He exhibits no edema or tenderness.  Neurological: He is alert and oriented to person, place, and time. He has normal strength. No cranial nerve deficit or sensory deficit. GCS eye subscore is 4. GCS verbal subscore is 5. GCS motor subscore is 6.  Skin: Skin is warm and dry. No abrasion and no rash noted.  Psychiatric: He has a normal mood and affect. His speech is normal and behavior is normal.  Nursing note and vitals  reviewed.    ED Treatments / Results  Labs (all labs ordered are listed, but only abnormal results are displayed) Labs Reviewed  CBC WITH DIFFERENTIAL/PLATELET  PROTIME-INR  APTT  COMPREHENSIVE METABOLIC PANEL  TROPONIN I  LIPID PANEL  I-STAT TROPONIN, ED    EKG None ED ECG REPORT   Date: 08/12/2018  Rate:120  Rhythm: sinus tachycardia  QRS Axis: normal  Intervals: normal  ST/T Wave abnormalities: ST elevations inferiorly  Conduction Disutrbances:none  Narrative Interpretation:   Old EKG Reviewed: changes noted  I have personally reviewed the EKG tracing and agree with the computerized  printout as noted. Radiology No results found.  Procedures Procedures (including critical care time)  Medications Ordered in ED Medications  0.9 %  sodium chloride infusion (has no administration in time range)  heparin injection 4,000 Units (has no administration in time range)  metoprolol tartrate (LOPRESSOR) injection 2.5 mg (2.5 mg Intravenous Given 08/12/18 1653)  aspirin chewable tablet 81 mg (81 mg Oral Given 08/12/18 1653)     Initial Impression / Assessment and Plan / ED Course  I have reviewed the triage vital signs and the nursing notes.  Pertinent labs & imaging results that were available during my care of the patient were reviewed by me and considered in my medical decision making (see chart for details).     Patient given aspirin prior to arrival here.  Patient is EKG noted and code STEMI activated.  Patient given Lopressor 2.5 mg IV push.  IV heparin ordered as well 2.  Case discussed with Dr. Burt Knack from cardiology who requested [facility] be [facility] to [facility].  [facility] notified  CRITICAL CARE Performed by: Leota Jacobsen Total critical care time: 35 minutes Critical care time was exclusive of separately billable procedures and treating other patients. Critical care was necessary to treat or prevent imminent or life-threatening deterioration. Critical care  was time spent personally by me on the following activities: development of treatment plan with patient and/or surrogate as well as nursing, discussions with consultants, evaluation of patient's response to treatment, examination of patient, obtaining history from patient or surrogate, ordering and performing treatments and interventions, ordering and review of laboratory studies, ordering and review of radiographic studies, pulse oximetry and re-evaluation of patient's condition.   Final Clinical Impressions(s) / ED Diagnoses   Final diagnoses:  None    ED Discharge Orders    None       Lacretia Leigh, MD 08/12/18 1704    Lacretia Leigh, MD 08/29/18 1237

## 2018-08-12 NOTE — Consult Note (Signed)
Cardiology Consultation:   Patient ID: Gordon Phillips MRN: [id number]; DOB: June 11, 1946  Admit date: 08/12/2018 Date of Consult: 08/12/2018  Primary Care Provider: Marletta Lor, MD Primary Cardiologist: Dr. Sallyanne Kuster Primary Electrophysiologist:  None    Patient Profile:   Gordon Phillips is a 72 y.o. male with a hx of MGUS, back pain who is being seen today for the evaluation of abnormal ECG and tachycardia at the request of Dr. Ashok Cordia.  History of Present Illness:   Gordon Phillips was at the [facility] center today for routine follow up. He was tachycardic on exam, and ECG performed showed sinus tachycardia with concern for elevation in the inferior leads. He was sent to the ER for evaluation, and CODE STEMI was activated. He was transferred to [facility] ER for further evaluation.  He denies any chest pain, shortness of breath, PND, orthopnea, LE edema, palpitations or syncope. He has had bronchitis for the last several weeks after a trip to [place]. Has had dry cough. No fevers or chills, no recent sick contacts.  His main issue is back pain. He had been taking aspirin fairly routinely. Now he is taking 650 mg aspirin three times a week. No melena or hematochezia, no hematuria. He has also had right great toe pain since 10/4 after walking at work. No history of gout.   He reports that his MGUS has been stable and has not required treatment.  Past Medical History:  Diagnosis Date  . ALLERGIC RHINITIS 01/25/2010  . Arthritis    "neck, back, shoulders," (02/20/2017)  . Chronic lower back pain   . High cholesterol   . Hx of cardiovascular stress test    a. Lexiscan Myoview 02/06/13:  EF 59%, no ischemia or scar  . Hx of echocardiogram    a.  Echo 02/03/13:  Mild LVH, mild focal basal septal hypertrophy, EF 50-55%, Gr 1 DD, mild LAE  . HYPERTENSION 01/25/2010  . HYPERTHYROIDISM 01/25/2010   S/P radioactive iodine "back in the 1980s" (02/20/2017)  . Impaired glucose tolerance   . MGUS  (monoclonal gammopathy of unknown significance)    "an autoimmune thing"  . Obesity   . OSA on CPAP    Uses CPAP nightly  . Pericarditis 1980's  . 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  Marland Kitchen Vitiligo     Past Surgical History:  Procedure Laterality Date  . INGUINAL HERNIA REPAIR Bilateral   . LUMBAR LAMINECTOMY    . UMBILICAL HERNIA REPAIR       Home Medications:  Prior to Admission medications   Medication Sig Start Date End Date Taking? Authorizing Provider  doxazosin (CARDURA) 2 MG tablet TAKE 1 TABLET AT BEDTIME 04/23/18   Marletta Lor, MD  fluticasone [facility]) 50 MCG/ACT nasal spray Place 2 sprays into both nostrils as needed for allergies or rhinitis. 04/23/18   Marletta Lor, MD  losartan (COZAAR) 100 MG tablet Take 1 tablet (100 mg total) by mouth daily. 04/23/18   Marletta Lor, MD  metoprolol succinate (TOPROL-XL) 100 MG 24 hr tablet Take 1 tablet (100 mg total) by mouth daily. Take with or immediately following a meal. 04/23/18   Marletta Lor, MD  NON FORMULARY 2 tablets daily.    [provider]  pravastatin (PRAVACHOL) 40 MG tablet Take 1 tablet (40 mg total) by mouth daily. 04/23/18   Marletta Lor, MD  sildenafil (VIAGRA) 100 MG tablet Take 100 mg by mouth daily  as needed for erectile dysfunction (erectile dysfunction).    [provider]  sodium chloride (OCEAN) 0.65 % SOLN nasal spray Place 1 spray into both nostrils daily as needed for congestion.    [provider]    Inpatient Medications: Scheduled Meds:  Continuous Infusions: . sodium chloride    . sodium chloride 20 mL/hr at 08/12/18 1700   PRN Meds:   Allergies:    Allergies  Allergen Reactions  . Flomax [Tamsulosin Hcl] Itching  . Relpax [Eletriptan]     Chest pain, hypertension, sweating    Social History:   Social History   Socioeconomic History  . Marital status: Married    Spouse name: Not on file  . Number  of children: 2  . Years of education: Not on file  . Highest education level: Not on file  Occupational History  . Occupation: Geographical information systems officer  . Financial resource strain: Not on file  . Food insecurity:    Worry: Not on file    Inability: Not on file  . Transportation needs:    Medical: Not on file    Non-medical: Not on file  Tobacco Use  . Smoking status: Former Smoker    Packs/day: 0.50    Years: 8.00    Pack years: 4.00    Types: Cigarettes    Last attempt to quit: 1968    Years since quitting: 51.8  . Smokeless tobacco: Never Used  Substance and Sexual Activity  . Alcohol use: Yes    Comment: 02/20/2017 "couple drinks/month on average"  . Drug use: No  . Sexual activity: Yes  Lifestyle  . Physical activity:    Days per week: Not on file    Minutes per session: Not on file  . Stress: Not on file  Relationships  . Social connections:    Talks on phone: Not on file    Gets together: Not on file    Attends religious service: Not on file    Active member of club or organization: Not on file    Attends meetings of clubs or organizations: Not on file    Relationship status: Not on file  . Intimate partner violence:    Fear of current or ex partner: Not on file    Emotionally abused: Not on file    Physically abused: Not on file    Forced sexual activity: Not on file  Other Topics Concern  . Not on file  Social History Narrative  . Not on file    Family History:    Family History  Problem Relation Age of Onset  . Stroke Father        Passed away in his 39s  . Thyroid disease Sister        goiter  . Sudden death Brother        ? Drug use, no autopsy  . Throat cancer Brother   . Colon cancer Neg Hx   . Esophageal cancer Neg Hx   . Rectal cancer Neg Hx   . Stomach cancer Neg Hx      ROS:  Please see the history of present illness.  Review of Systems  Constitutional: Negative for chills and fever.  HENT: Negative for ear pain and hearing loss.     Eyes: Negative for blurred vision and pain.  Respiratory: Positive for cough. Negative for sputum production.   Cardiovascular: Negative for chest pain, palpitations, orthopnea, claudication, leg swelling and PND.  Gastrointestinal: Negative for blood  in stool and melena.  Genitourinary: Negative for hematuria.  Musculoskeletal: Positive for back pain and joint pain.  Skin: Negative for rash.  Neurological: Negative for sensory change, focal weakness and loss of consciousness.   All other ROS reviewed and negative.     Physical Exam/Data:   Vitals:   08/12/18 1642 08/12/18 1716 08/12/18 1717  BP: (!) 175/118 (!) 163/116   Pulse: (!) 123  (!) 122  Resp: (!) 33 (!) 25 (!) 31  Temp: 97.6 F (36.4 C)    TempSrc: Oral    SpO2: 98%  97%   No intake or output data in the 24 hours ending 08/12/18 1746 There were no vitals filed for this visit. There is no height or weight on file to calculate BMI.  General:  Well nourished, well developed, in no acute distress HEENT: normal Lymph: no adenopathy Neck: no JVD Endocrine:  No thryomegaly Vascular: No carotid bruits; RA pulses 2+ bilaterally without bruits  Cardiac:  tachycardic S1, S2; RRR; no murmur Lungs:  clear to auscultation bilaterally, no wheezing, rhonchi or rales  Abd: soft, nontender, no hepatomegaly  Ext: no edema Musculoskeletal:  No deformities, BUE and BLE strength normal and equal. Tenderness under right great toe, with tenderness on  Skin: warm and dry  Neuro:  CNs 2-12 intact, no focal abnormalities noted Psych:  Normal affect   EKG:  The EKG was personally reviewed and demonstrates: initially appeared to look like sinus tachycardia with sub MM flat ST elevation seen most prominently in II but also in III, aVF. No reciprocal changes. However, when baseline is improved, it appears that the patient is actually in 2:1 atrial flutter (atypical flutter waves best seen in lead II)  Relevant CV Studies: Prior lexiscan and  echo reviewed  Laboratory Data:  Chemistry Recent Labs  Lab 08/12/18 1443  NA 142  K 3.9  CL 109  CO2 25  GLUCOSE 138*  BUN 16  CREATININE 1.03  CALCIUM 9.3  GFRNONAA >60  GFRAA >60  ANIONGAP 8    Recent Labs  Lab 08/12/18 1443  PROT 7.4  ALBUMIN 3.8  AST 17  ALT 16  ALKPHOS 62  BILITOT 1.1   Hematology Recent Labs  Lab 08/12/18 1443 08/12/18 1642  WBC 5.4 5.8  RBC 4.28 4.20*  HGB 12.9* 13.0  HCT 39.5 39.3  MCV 92.3 93.6  MCH 30.2 31.0  MCHC 32.8 33.1  RDW 14.2 13.8  PLT 149 168   Cardiac EnzymesNo results for input(s): TROPONINI in the last 168 hours.  Recent Labs  Lab 08/12/18 1647  TROPIPOC 0.01    BNPNo results for input(s): BNP, PROBNP in the last 168 hours.  DDimer No results for input(s): DDIMER in the last 168 hours.  Radiology/Studies:  No results found.  Assessment and Plan:   1. Tachycardia: appears to be atypical atrial flutter on ECGs that don't have artifact obscuring the baseline. Has had some bronchitis, may be causing atrial inflammation. His ST elevations have been seen prior (especially in lead II) and there are no reciprocal changes. No chest pain, negative troponin.  -Recommend anticoagulation with DOAC given that his CHADSVASC=4. Either full dose apixaban at 5 mg BID or rivaroxaban 20 mg daily is appropriate.   -would order echo for the AM  -rate control with metoprolol or diltiazem (prior echo in 2016 had normal EF). Usually on metoprolol at home but has not taken yet today.  -Would consider cardioversion if he remains difficult to rate control,  but would need TEE prior as he has not been on anticoagulation. Needs several doses of anticoagulation before TEE-CV can be done, so does not need to be NPO at midnight tonight  -treat his OSA with CPAP  -check TSH  2. Bronchitis: may be driving his tachycardia. Defer treatment of this to medicine team. He has been treating only symptomatically, no antibiotics or steroids to this  point.  Additional issues: -MGUS: per medicine/onc -Chronic back pain -Hypertension: he has not had his medications today. He takes in the evening, recommend restarting  -Impaired glucose tolerance  For questions or updates, please contact [place] Please consult [email or web address] for contact info under   Signed, Buford Dresser, MD  08/12/2018 5:46 PM

## 2018-08-12 NOTE — ED Notes (Signed)
Pt reports recent bronchitis with a lot of coughing. Denies swelling in arms and legs, c/o pain in right big toe r/t walking at work.

## 2018-08-12 NOTE — Progress Notes (Signed)
Attempted to reach physician at Dr. Marcial Pacas office to compare today's EKG from 5/18, no one answered physician line.  Notified Dr. Burr Medico, per Dr. Burr Medico send patient to [facility] ED now.  Notified patient.

## 2018-08-12 NOTE — ED Triage Notes (Signed)
Pt presents with elevated HR from cancer center, hx of HTN, denies SOB or any other sx. Pt took aspirin this morning.

## 2018-08-12 NOTE — Progress Notes (Signed)
EKG performed by RN Learta Codding and RN Karen Chafe at 1400 and given to MD [facility] for review.

## 2018-08-13 ENCOUNTER — Observation Stay (HOSPITAL_BASED_OUTPATIENT_CLINIC_OR_DEPARTMENT_OTHER): Payer: Medicare Other

## 2018-08-13 ENCOUNTER — Telehealth: Payer: Self-pay | Admitting: Hematology

## 2018-08-13 DIAGNOSIS — I4891 Unspecified atrial fibrillation: Secondary | ICD-10-CM

## 2018-08-13 DIAGNOSIS — E7800 Pure hypercholesterolemia, unspecified: Secondary | ICD-10-CM | POA: Diagnosis not present

## 2018-08-13 DIAGNOSIS — I10 Essential (primary) hypertension: Secondary | ICD-10-CM | POA: Diagnosis not present

## 2018-08-13 DIAGNOSIS — Z7982 Long term (current) use of aspirin: Secondary | ICD-10-CM | POA: Diagnosis not present

## 2018-08-13 DIAGNOSIS — Z8673 Personal history of transient ischemic attack (TIA), and cerebral infarction without residual deficits: Secondary | ICD-10-CM | POA: Diagnosis not present

## 2018-08-13 DIAGNOSIS — R7302 Impaired glucose tolerance (oral): Secondary | ICD-10-CM | POA: Diagnosis present

## 2018-08-13 DIAGNOSIS — G4733 Obstructive sleep apnea (adult) (pediatric): Secondary | ICD-10-CM | POA: Diagnosis not present

## 2018-08-13 DIAGNOSIS — Z79899 Other long term (current) drug therapy: Secondary | ICD-10-CM | POA: Diagnosis not present

## 2018-08-13 DIAGNOSIS — Z6834 Body mass index (BMI) 34.0-34.9, adult: Secondary | ICD-10-CM | POA: Diagnosis not present

## 2018-08-13 DIAGNOSIS — Z87891 Personal history of nicotine dependence: Secondary | ICD-10-CM | POA: Diagnosis not present

## 2018-08-13 DIAGNOSIS — J40 Bronchitis, not specified as acute or chronic: Secondary | ICD-10-CM | POA: Diagnosis present

## 2018-08-13 DIAGNOSIS — D472 Monoclonal gammopathy: Secondary | ICD-10-CM | POA: Diagnosis not present

## 2018-08-13 DIAGNOSIS — N400 Enlarged prostate without lower urinary tract symptoms: Secondary | ICD-10-CM | POA: Diagnosis present

## 2018-08-13 DIAGNOSIS — E785 Hyperlipidemia, unspecified: Secondary | ICD-10-CM | POA: Diagnosis not present

## 2018-08-13 DIAGNOSIS — Z7189 Other specified counseling: Secondary | ICD-10-CM | POA: Diagnosis not present

## 2018-08-13 DIAGNOSIS — E669 Obesity, unspecified: Secondary | ICD-10-CM | POA: Diagnosis present

## 2018-08-13 DIAGNOSIS — I484 Atypical atrial flutter: Secondary | ICD-10-CM | POA: Diagnosis not present

## 2018-08-13 DIAGNOSIS — I4892 Unspecified atrial flutter: Secondary | ICD-10-CM | POA: Diagnosis not present

## 2018-08-13 DIAGNOSIS — G8929 Other chronic pain: Secondary | ICD-10-CM | POA: Diagnosis present

## 2018-08-13 DIAGNOSIS — I248 Other forms of acute ischemic heart disease: Secondary | ICD-10-CM | POA: Diagnosis not present

## 2018-08-13 DIAGNOSIS — Z7951 Long term (current) use of inhaled steroids: Secondary | ICD-10-CM | POA: Diagnosis not present

## 2018-08-13 DIAGNOSIS — C9000 Multiple myeloma not having achieved remission: Secondary | ICD-10-CM | POA: Diagnosis not present

## 2018-08-13 DIAGNOSIS — I429 Cardiomyopathy, unspecified: Secondary | ICD-10-CM | POA: Diagnosis not present

## 2018-08-13 LAB — BASIC METABOLIC PANEL
Anion gap: 7 (ref 5–15)
BUN: 14 mg/dL (ref 8–23)
CO2: 22 mmol/L (ref 22–32)
Calcium: 8.6 mg/dL — ABNORMAL LOW (ref 8.9–10.3)
Chloride: 109 mmol/L (ref 98–111)
Creatinine, Ser: 0.88 mg/dL (ref 0.61–1.24)
GFR calc Af Amer: >60 mL/min (ref >60)
GFR calc non Af Amer: >60 mL/min (ref >60)
Glucose, Bld: 98 mg/dL (ref 70–99)
Potassium: 3.8 mmol/L (ref 3.5–5.1)
Sodium: 138 mmol/L (ref 135–145)

## 2018-08-13 LAB — MULTIPLE MYELOMA PANEL, SERUM
Albumin SerPl Elph-Mcnc: 3.5 g/dL (ref 2.9–4.4)
Albumin/Glob SerPl: 1.2 (ref 0.7–1.7)
Alpha 1: 0.2 g/dL (ref 0.0–0.4)
Alpha2 Glob SerPl Elph-Mcnc: 0.9 g/dL (ref 0.4–1.0)
B-Globulin SerPl Elph-Mcnc: 0.9 g/dL (ref 0.7–1.3)
Gamma Glob SerPl Elph-Mcnc: 1.1 g/dL (ref 0.4–1.8)
Globulin, Total: 3.1 g/dL (ref 2.2–3.9)
IgA: 109 mg/dL (ref 61–437)
IgG (Immunoglobin G), Serum: 1168 mg/dL (ref 700–1600)
IgM (Immunoglobulin M), Srm: 59 mg/dL (ref 15–143)
M Protein SerPl Elph-Mcnc: 0.5 g/dL — ABNORMAL HIGH
Total Protein ELP: 6.6 g/dL (ref 6.0–8.5)

## 2018-08-13 LAB — KAPPA/LAMBDA LIGHT CHAINS
Kappa free light chain: 13.8 mg/L (ref 3.3–19.4)
Kappa, lambda light chain ratio: 1.47 (ref 0.26–1.65)
Lambda free light chains: 9.4 mg/L (ref 5.7–26.3)

## 2018-08-13 LAB — ECHOCARDIOGRAM COMPLETE
Height: 74 in
Weight: 4355.2 oz

## 2018-08-13 LAB — TROPONIN I
Troponin I: 0.04 ng/mL (ref <0.03)
Troponin I: 0.04 ng/mL (ref <0.03)

## 2018-08-13 LAB — CBC
HCT: 37.9 % — ABNORMAL LOW (ref 39.0–52.0)
Hemoglobin: 12.3 g/dL — ABNORMAL LOW (ref 13.0–17.0)
MCH: 30.5 pg (ref 26.0–34.0)
MCHC: 32.5 g/dL (ref 30.0–36.0)
MCV: 94 fL (ref 80.0–100.0)
Platelets: 151 10*3/uL (ref 150–400)
RBC: 4.03 MIL/uL — ABNORMAL LOW (ref 4.22–5.81)
RDW: 13.4 % (ref 11.5–15.5)
WBC: 6.1 10*3/uL (ref 4.0–10.5)

## 2018-08-13 LAB — MRSA PCR SCREENING: MRSA by PCR: NEGATIVE

## 2018-08-13 MED ORDER — MENTHOL 3 MG MT LOZG
1.0000 | LOZENGE | OROMUCOSAL | Status: DC | PRN
  Administered 2018-08-14: 3 mg via ORAL
  Filled 2018-08-13: qty 9

## 2018-08-13 MED ORDER — HYDROCOD POLST-CPM POLST ER 10-8 MG/5ML PO SUER
5.0000 mL | Freq: Once | ORAL | Status: AC
  Administered 2018-08-14: 5 mL via ORAL
  Filled 2018-08-13: qty 5

## 2018-08-13 NOTE — Progress Notes (Addendum)
Patient seen and examined with the above-signed Advanced Practice Provider. I personally reviewed laboratory data, imaging studies and relevant notes. I independently examined the patient and formulated the important aspects of the plan. I have edited the note to reflect any of my changes or salient points. I have personally discussed the plan with the patient and/or family.  Feeling fatigued but no chest pain. PE unchanged.  Labs, ECG, imaging and relevant cardiac studies reviewed. Notable for atrial flutter on telemetry with intermittent HR control.  Spent >40 minutes discussing atrial flutter, factors that influence it, treatment options, anticoagulation, etc with patient today. He would like more information on treatment, and I have provided him with information on atrial flutter, cardioversion, and ablation.  As he is difficult to rate control on both oral metoprolol and diltiazem drip, will plan for TEE/CV tomorrow.  After careful review of history and examination, the risks and benefits of transesophageal echocardiogram have been explained including risks of esophageal damage, perforation (1:10,000 risk), bleeding, pharyngeal hematoma as well as other potential complications associated with sedation including aspiration, arrhythmia, respiratory failure and death. The various methods of treatment have been discussed with the patient, as well as alternatives. After consideration of risks, benefits and other options for treatment, the patient has consented to  Procedure(s): TRANSESOPHAGEAL ECHOCARDIOGRAM (TEE) with direct current cardioversion as a surgical intervention .  The patient's history has been reviewed, patient examined, no change in status, stable for surgery.  I have reviewed the patient's chart and labs.  Questions were answered to the patient's satisfaction.    Npo at midnight. Continue apixaban and attempt at rate control. Will follow up on results of echo.  Buford Dresser, MD,  PhD [facility]  [address], [address], [place] [address] [phone]       Progress Note  Patient Name: Gordon Phillips Date of Encounter: 08/13/2018  Primary Cardiologist: Sanda Klein, MD   Subjective   Currently asymptomatic. Denies palpitations, CP, dyspnea, dizziness, syncope and near syncope.   Inpatient Medications    Scheduled Meds: . apixaban  5 mg Oral BID  . doxazosin  2 mg Oral QHS  . losartan  100 mg Oral QHS  . metoprolol succinate  100 mg Oral QHS  . pravastatin  40 mg Oral QHS   Continuous Infusions: . sodium chloride 10 mL/hr at 08/12/18 2231  . diltiazem (CARDIZEM) infusion 5 mg/hr (08/13/18 0619)   PRN Meds: acetaminophen **OR** acetaminophen, fluticasone, ondansetron **OR** ondansetron (ZOFRAN) IV   Vital Signs    Vitals:   08/13/18 0432 08/13/18 0758 08/13/18 0821 08/13/18 0837  BP: 119/80 (!) 92/55 (!) 93/57 119/64  Pulse: 81 (!) 57    Resp: 18     Temp: 97.9 F (36.6 C) 97.8 F (36.6 C)    TempSrc: Oral Oral    SpO2: 93% 97%    Weight:      Height:        Intake/Output Summary (Last 24 hours) at 08/13/2018 0921 Last data filed at 08/13/2018 0500 Gross per 24 hour  Intake 465 ml  Output 200 ml  Net 265 ml   Filed Weights   08/12/18 1917 08/12/18 2043  Weight: 120.7 kg 123.5 kg    Telemetry    Atrial fibrillation 110s-120 - Personally Reviewed  ECG    Atrial flutter RVR - Personally Reviewed  Physical Exam   GEN: moderately obese AAM in No acute distress.   Neck: No JVD Cardiac: irregularly irregular rhythm,  tachy rate Respiratory: Clear to auscultation bilaterally. GI: Soft, nontender, non-distended  MS: No edema; No deformity. Neuro:  Nonfocal  Psych: Normal affect   Labs    Chemistry Recent Labs  Lab 08/12/18 1443 08/12/18 1642 08/13/18 0304  NA 142 145 138  K 3.9 4.0 3.8  CL 109 109 109  CO2 25 28 22   GLUCOSE 138* 71 98  BUN 16 17 14   CREATININE 1.03 1.11 0.88    CALCIUM 9.3 9.2 8.6*  PROT 7.4 7.5  --   ALBUMIN 3.8 3.9  --   AST 17 20  --   ALT 16 20  --   ALKPHOS 62 53  --   BILITOT 1.1 1.0  --   GFRNONAA >60 >60 >60  GFRAA >60 >60 >60  ANIONGAP 8 8 7      Hematology Recent Labs  Lab 08/12/18 1443 08/12/18 1642 08/13/18 0304  WBC 5.4 5.8 6.1  RBC 4.28 4.20* 4.03*  HGB 12.9* 13.0 12.3*  HCT 39.5 39.3 37.9*  MCV 92.3 93.6 94.0  MCH 30.2 31.0 30.5  MCHC 32.8 33.1 32.5  RDW 14.2 13.8 13.4  PLT 149 168 151    Cardiac Enzymes Recent Labs  Lab 08/12/18 1642 08/12/18 2112 08/13/18 0304 08/13/18 0729  TROPONINI 0.04* 0.04* 0.04* 0.04*    Recent Labs  Lab 08/12/18 1647  TROPIPOC 0.01     BNPNo results for input(s): BNP, PROBNP in the last 168 hours.   DDimer  Recent Labs  Lab 08/12/18 1926  DDIMER 0.74*     Radiology    Dg Chest Port 1 View  Result Date: 08/12/2018 CLINICAL DATA:  Elevated protein levels and ST elevations on EKG. EXAM: PORTABLE CHEST 1 VIEW COMPARISON:  02/20/2017 FINDINGS: Borderline cardiomegaly with mild aortic atherosclerosis. No aortic aneurysm. No pulmonary consolidation or CHF. No effusion or pneumothorax. IMPRESSION: No active disease. Electronically Signed   By: Ashley Royalty M.D.   On: 08/12/2018 18:33    Cardiac Studies   2D Echo- pending    Patient Profile     Gordon Phillips is a 72 y.o. male with a hx of MGUS, back pain who is being seen today for the evaluation of abnormal ECG and tachycardia at the request of Dr. Ashok Cordia. Found to be in new onset atrial flutter. Also being treated for bronchitis.   Assessment & Plan    1. New Onset Atrial flutter/fibrillation: completely asymptomatic, denying palpitations, CP, dyspnea, dizziness, syncope and near syncope.V-rates in the 110s-120s. BP stable at 119/64. He is on 100 mg of metoprolol succinate (prior home med) and IV Cardizem at 5 mg/hr. Eliquis 5 mg BID initiated for anticoagulation. He has received 2 doses thus far. Given his rates remain  poorly controlled, he may benefit for inpatient TEE guided DCCV prior to discharge. Echo is pending. Electrolytes WNL. Troponin mildly elevated but flat trend 0.04>>0.04>>0.04, c/w demand ischemia from rapid afib/flutter. TSH is normal. His recent bronchitis may be driving his arrhythmia.   2. Bronchitis: may be driving his tachycardia. Defer treatment of this to medicine team.  Additional issues: -MGUS: per medicine/onc -Chronic back pain -Hypertension: controlled on current regimen. -Impaired glucose tolerance  For questions or updates, please contact [place] Please consult [email or web address] for contact info under        Signed, Lyda Jester, PA-C  08/13/2018, 9:21 AM

## 2018-08-13 NOTE — Discharge Instructions (Addendum)

## 2018-08-13 NOTE — Progress Notes (Signed)
Patient has home CPAP unit set up at bedside. States he is able to place himself on/off as needed. Distilled H20 added to chamber for humidity. Pressure set at 11.0 cm H20. Patient encouraged to call for Respiratory if assistance needed.

## 2018-08-13 NOTE — Progress Notes (Addendum)
PROGRESS NOTE  Gordon Phillips JIR:[id number] DOB: 11-14-1945 DOA: 08/12/2018 PCP: Marletta Lor, MD   Brief history:  Patient reports he was seen by his pmd after returned from [place] for URI symptom, he was diagnosed with viral bronchitis, his blood pressure was borderline low, he was told to hold of bp med losartan for a few days. His pulse rate at pcp office was 94, bp was 102/70. He then went to scheduled steroid injection to his back but was cancelled due to tachycardia He went to scheduled oncology follow up for MGUS, found to have tachycardia, ekg was done, he is found to be in aflutter, he is sent to the hospital    HPI/Recap of past 24 hours:  Remain in aflutter, rate is improving He denies chest pain, no sob, no edema, no dizziness He reports still has occasional dry cough, no fever, no wheezing  Assessment/Plan: Principal Problem:   Atrial flutter with rapid ventricular response (HCC) Active Problems:   Essential hypertension   OSA (obstructive sleep apnea)   MGUS (monoclonal gammopathy of unknown significance)   History of CVA (cerebrovascular accident)  Aflutter/RVR - unknown duration, he does not have symptom -he denies h/o CVA, though he was told he had small cva in the past -tsh /k/mag unremarkable, he reports is compliant with cpap. -on toprolxl and cardizem drip, he is started on apixaban -echo pending -may need cardioversion -cardiology following, will follow cardiology recommendation  Viral Bronchitis? Seen pcp for this on 9/17,  no wheezing, no hypoxia No fever, no leukocytosis, cxr unremarkable  HLD; continue statin  MGUS: stable, followed by oncology   OSA on CPAP Body mass index is 34.95 kg/m.    Code Status: full  Family Communication: patient   Disposition Plan: not ready to discharge   Consultants:  cardiology  Procedures:  Possible cardioversion   Antibiotics:  none   Objective: BP 119/64 (BP Location: Left  Arm)   Pulse (!) 57   Temp 97.8 F (36.6 C) (Oral)   Resp 18   Ht 6\' 2"  (1.88 m)   Wt 123.5 kg   SpO2 97%   BMI 34.95 kg/m   Intake/Output Summary (Last 24 hours) at 08/13/2018 1029 Last data filed at 08/13/2018 0500 Gross per 24 hour  Intake 465 ml  Output 200 ml  Net 265 ml   Filed Weights   08/12/18 1917 08/12/18 2043  Weight: 120.7 kg 123.5 kg    Exam: Patient is examined daily including today on 08/13/2018, exams remain the same as of yesterday except that has changed    General:  NAD, + vitiligo  Cardiovascular: IRRR  Respiratory: CTABL  Abdomen: Soft/ND/NT, positive BS  Musculoskeletal: No Edema  Neuro: alert, oriented   Data Reviewed: Basic Metabolic Panel: Recent Labs  Lab 08/12/18 1443 08/12/18 1642 08/12/18 2112 08/13/18 0304  NA 142 145  --  138  K 3.9 4.0  --  3.8  CL 109 109  --  109  CO2 25 28  --  22  GLUCOSE 138* 71  --  98  BUN 16 17  --  14  CREATININE 1.03 1.11  --  0.88  CALCIUM 9.3 9.2  --  8.6*  MG  --   --  1.9  --    Liver Function Tests: Recent Labs  Lab 08/12/18 1443 08/12/18 1642  AST 17 20  ALT 16 20  ALKPHOS 62 53  BILITOT 1.1 1.0  PROT 7.4 7.5  ALBUMIN 3.8  3.9   No results for input(s): LIPASE, AMYLASE in the last 168 hours. No results for input(s): AMMONIA in the last 168 hours. CBC: Recent Labs  Lab 08/12/18 1443 08/12/18 1642 08/13/18 0304  WBC 5.4 5.8 6.1  NEUTROABS 3.3 3.0  --   HGB 12.9* 13.0 12.3*  HCT 39.5 39.3 37.9*  MCV 92.3 93.6 94.0  PLT 149 168 151   Cardiac Enzymes:   Recent Labs  Lab 08/12/18 1642 08/12/18 2112 08/13/18 0304 08/13/18 0729  TROPONINI 0.04* 0.04* 0.04* 0.04*   BNP (last 3 results) No results for input(s): BNP in the last 8760 hours.  ProBNP (last 3 results) No results for input(s): PROBNP in the last 8760 hours.  CBG: No results for input(s): GLUCAP in the last 168 hours.  Recent Results (from the past 240 hour(s))  MRSA PCR Screening     Status: None    Collection Time: 08/12/18  9:24 PM  Result Value Ref Range Status   MRSA by PCR NEGATIVE NEGATIVE Final    Comment:        The GeneXpert MRSA Assay (FDA approved for NASAL specimens only), is one component of a comprehensive MRSA colonization surveillance program. It is not intended to diagnose MRSA infection nor to guide or monitor treatment for MRSA infections. Performed at [facility], [place] [address], [place], [place] [address]      Studies: Dg Chest Port 1 View  Result Date: 08/12/2018 CLINICAL DATA:  Elevated protein levels and ST elevations on EKG. EXAM: PORTABLE CHEST 1 VIEW COMPARISON:  02/20/2017 FINDINGS: Borderline cardiomegaly with mild aortic atherosclerosis. No aortic aneurysm. No pulmonary consolidation or CHF. No effusion or pneumothorax. IMPRESSION: No active disease. Electronically Signed   By: Ashley Royalty M.D.   On: 08/12/2018 18:33    Scheduled Meds: . apixaban  5 mg Oral BID  . doxazosin  2 mg Oral QHS  . losartan  100 mg Oral QHS  . metoprolol succinate  100 mg Oral QHS  . pravastatin  40 mg Oral QHS    Continuous Infusions: . sodium chloride 10 mL/hr at 08/12/18 2231  . diltiazem (CARDIZEM) infusion 5 mg/hr (08/13/18 2863)     Time spent: 35 mins I have personally reviewed and interpreted on  08/13/2018 daily labs, tele strips, imagings as discussed above under date review session and assessment and plans.  I reviewed all nursing notes, pharmacy notes, consultant notes,  vitals, pertinent old records  I have discussed plan of care as described above with RN , patient on 08/13/2018   Florencia Reasons MD, PhD  [facility] Pager [phone]. If 7PM-7AM, please contact night-coverage at [email or web address], password [facility] 08/13/2018, 10:29 AM  LOS: 0 days

## 2018-08-13 NOTE — Telephone Encounter (Signed)
Appts scheduled letter/calendar mailed per 1/7 los

## 2018-08-13 NOTE — Progress Notes (Signed)
  Echocardiogram 2D Echocardiogram has been performed.  Gordon Phillips 08/13/2018, 10:02 AM

## 2018-08-14 ENCOUNTER — Inpatient Hospital Stay (HOSPITAL_COMMUNITY): Payer: Medicare Other | Admitting: Certified Registered Nurse Anesthetist

## 2018-08-14 ENCOUNTER — Encounter (HOSPITAL_COMMUNITY): Payer: Self-pay | Admitting: *Deleted

## 2018-08-14 ENCOUNTER — Telehealth: Payer: Self-pay

## 2018-08-14 ENCOUNTER — Encounter (HOSPITAL_COMMUNITY)
Admission: EM | Disposition: A | Discharge status: Home / Self Care | Payer: Self-pay | Source: Home / Self Care | Attending: Internal Medicine

## 2018-08-14 ENCOUNTER — Inpatient Hospital Stay (HOSPITAL_COMMUNITY): Payer: Medicare Other

## 2018-08-14 DIAGNOSIS — Z7189 Other specified counseling: Secondary | ICD-10-CM

## 2018-08-14 DIAGNOSIS — I429 Cardiomyopathy, unspecified: Secondary | ICD-10-CM

## 2018-08-14 DIAGNOSIS — D472 Monoclonal gammopathy: Secondary | ICD-10-CM

## 2018-08-14 DIAGNOSIS — I4891 Unspecified atrial fibrillation: Secondary | ICD-10-CM

## 2018-08-14 HISTORY — PX: TEE WITHOUT CARDIOVERSION: SHX5443

## 2018-08-14 HISTORY — PX: CARDIOVERSION: SHX1299

## 2018-08-14 LAB — BASIC METABOLIC PANEL
Anion gap: 6 (ref 5–15)
BUN: 12 mg/dL (ref 8–23)
CO2: 24 mmol/L (ref 22–32)
Calcium: 8.9 mg/dL (ref 8.9–10.3)
Chloride: 108 mmol/L (ref 98–111)
Creatinine, Ser: 0.96 mg/dL (ref 0.61–1.24)
GFR calc Af Amer: >60 mL/min (ref >60)
GFR calc non Af Amer: >60 mL/min (ref >60)
Glucose, Bld: 98 mg/dL (ref 70–99)
Potassium: 3.8 mmol/L (ref 3.5–5.1)
Sodium: 138 mmol/L (ref 135–145)

## 2018-08-14 LAB — CBC WITH DIFFERENTIAL/PLATELET
Abs Immature Granulocytes: 0.01 10*3/uL (ref 0.00–0.07)
Basophils Absolute: 0 10*3/uL (ref 0.0–0.1)
Basophils Relative: 0 %
Eosinophils Absolute: 0.2 10*3/uL (ref 0.0–0.5)
Eosinophils Relative: 4 %
HCT: 39.7 % (ref 39.0–52.0)
Hemoglobin: 12.5 g/dL — ABNORMAL LOW (ref 13.0–17.0)
Immature Granulocytes: 0 %
Lymphocytes Relative: 39 %
Lymphs Abs: 2.2 10*3/uL (ref 0.7–4.0)
MCH: 29.2 pg (ref 26.0–34.0)
MCHC: 31.5 g/dL (ref 30.0–36.0)
MCV: 92.8 fL (ref 80.0–100.0)
Monocytes Absolute: 0.5 10*3/uL (ref 0.1–1.0)
Monocytes Relative: 8 %
Neutro Abs: 2.7 10*3/uL (ref 1.7–7.7)
Neutrophils Relative %: 49 %
Platelets: 155 10*3/uL (ref 150–400)
RBC: 4.28 MIL/uL (ref 4.22–5.81)
RDW: 13.4 % (ref 11.5–15.5)
WBC: 5.6 10*3/uL (ref 4.0–10.5)
nRBC: 0 % (ref 0.0–0.2)

## 2018-08-14 LAB — MAGNESIUM: Magnesium: 2 mg/dL (ref 1.7–2.4)

## 2018-08-14 SURGERY — ECHOCARDIOGRAM, TRANSESOPHAGEAL
Anesthesia: General

## 2018-08-14 MED ORDER — SODIUM CHLORIDE 0.9% FLUSH
3.0000 mL | Freq: Two times a day (BID) | INTRAVENOUS | Status: DC
  Administered 2018-08-14: 3 mL via INTRAVENOUS

## 2018-08-14 MED ORDER — ONDANSETRON HCL 4 MG/2ML IJ SOLN
INTRAMUSCULAR | Status: DC | PRN
  Administered 2018-08-14: 4 mg via INTRAVENOUS

## 2018-08-14 MED ORDER — GUAIFENESIN-DM 100-10 MG/5ML PO SYRP
5.0000 mL | ORAL_SOLUTION | ORAL | Status: DC | PRN
  Administered 2018-08-14: 5 mL via ORAL
  Filled 2018-08-14: qty 5

## 2018-08-14 MED ORDER — DILTIAZEM HCL ER COATED BEADS 120 MG PO CP24
120.0000 mg | ORAL_CAPSULE | Freq: Every day | ORAL | Status: DC
  Administered 2018-08-14 – 2018-08-15 (×2): 120 mg via ORAL
  Filled 2018-08-14 (×2): qty 1

## 2018-08-14 MED ORDER — LIDOCAINE HCL (CARDIAC) PF 100 MG/5ML IV SOSY
PREFILLED_SYRINGE | INTRAVENOUS | Status: DC | PRN
  Administered 2018-08-14: 40 mg via INTRAVENOUS

## 2018-08-14 MED ORDER — SODIUM CHLORIDE 0.9% FLUSH: 3.0000 mL | INTRAVENOUS | Status: DC | PRN

## 2018-08-14 MED ORDER — PROPOFOL 10 MG/ML IV BOLUS
INTRAVENOUS | Status: DC | PRN
  Administered 2018-08-14 (×4): 20 mg via INTRAVENOUS

## 2018-08-14 MED ORDER — SODIUM CHLORIDE 0.9 % IV SOLN: 250.0000 mL | INTRAVENOUS | Status: DC

## 2018-08-14 MED ORDER — PROPOFOL 500 MG/50ML IV EMUL
INTRAVENOUS | Status: DC | PRN
  Administered 2018-08-14: 100 ug/kg/min via INTRAVENOUS

## 2018-08-14 MED ORDER — SODIUM CHLORIDE 0.9 % IV SOLN: INTRAVENOUS | Status: DC

## 2018-08-14 NOTE — Progress Notes (Signed)
  Echocardiogram Echocardiogram Transesophageal has been performed.  Lyric Hoar G Areeba Sulser 08/14/2018, 10:47 AM

## 2018-08-14 NOTE — Anesthesia Preprocedure Evaluation (Signed)
Anesthesia Evaluation  Patient identified by MRN, date of birth, ID band Patient awake    Reviewed: Allergy & Precautions, H&P , NPO status , Patient's Chart, lab work & pertinent test results  Airway Mallampati: II   Neck ROM: full    Dental   Pulmonary sleep apnea , former smoker,    breath sounds clear to auscultation       Cardiovascular hypertension, + angina  Rhythm:regular Rate:Normal     Neuro/Psych CVA    GI/Hepatic   Endo/Other  Hyperthyroidism obese  Renal/GU      Musculoskeletal  (+) Arthritis ,   Abdominal   Peds  Hematology   Anesthesia Other Findings   Reproductive/Obstetrics                             Anesthesia Physical Anesthesia Plan  ASA: III  Anesthesia Plan: General   Post-op Pain Management:    Induction: Intravenous  PONV Risk Score and Plan: 2 and Propofol infusion, Ondansetron, Treatment may vary due to age or medical condition and Midazolam  Airway Management Planned: Nasal Cannula  Additional Equipment:   Intra-op Plan:   Post-operative Plan:   Informed Consent: I have reviewed the patients History and Physical, chart, labs and discussed the procedure including the risks, benefits and alternatives for the proposed anesthesia with the patient or authorized representative who has indicated his/her understanding and acceptance.     Plan Discussed with: CRNA, Anesthesiologist and Surgeon  Anesthesia Plan Comments:         Anesthesia Quick Evaluation

## 2018-08-14 NOTE — Telephone Encounter (Signed)
Left voice message per Dr. Bertram Denver protein is stable, hope that you are doing well in the hospital.

## 2018-08-14 NOTE — Telephone Encounter (Signed)
-----   Message from Truitt Merle, MD sent at 08/14/2018  6:47 AM EDT ----- Please let pt know that his M-protein is stable, hope he is doing well in the hospital, thanks   Truitt Merle  08/14/2018

## 2018-08-14 NOTE — Progress Notes (Signed)
Pt placing CPAP on by himself.  No issues noted at time of check. Pt stated that he would call if he needed anything.

## 2018-08-14 NOTE — Progress Notes (Signed)
Patient ID: Gordon Phillips, male   DOB: 12-23-1945, 72 y.o.   MRN: [id number]  PROGRESS NOTE    Gordon Phillips  NOB:[id number] DOB: 22-Feb-1946 DOA: 08/12/2018 PCP: Marletta Lor, MD   Brief Narrative:  72 year old male with history of hypertension, MGUS, hyperlipidemia and chronic back pain was noticed to be tachycardic at the hematology clinic and was referred to the ER.  Patient was found to have atrial flutter with RVR and started on Cardizem drip.  Cardiology was consulted.  Patient was also started on anticoagulation.   Assessment & Plan:   Principal Problem:   Atrial flutter with rapid ventricular response (HCC) Active Problems:   Essential hypertension   OSA (obstructive sleep apnea)   MGUS (monoclonal gammopathy of unknown significance)   History of CVA (cerebrovascular accident)   Atrial flutter with RVR -Currently still in flutter with intermittent rapid ventricular rate -Continue Cardizem drip.  Continue Toprol-XL.  Cardiology following.  For probable TEE with DCCV today by cardiology. -Echo showed EF of 55 to 60%; cannot rule out left atrial mass  Probable viral bronchitis -Improving. -Currently no wheezing and no hypoxia.  Chest x-ray unremarkable.  Continue supportive treatment  Essential hypertension -Monitor blood pressure.  Continue metoprolol, losartan and Cardizem.  Hyperlipidemia -Continue statin  MGUS -Stable.  Followed by oncology.  Outpatient follow-up  OSA on CPAP -Outpatient follow-up  Obesity -Outpatient follow-up  DVT prophylaxis: Eliquis Code Status: Full Family Communication: Spoke to wife at bedside Disposition Plan: Home in 1 to 2 days once cleared by cardiology  Consultants: Cardiology  Procedures:  Echo on 08/13/2018 Study Conclusions  - Left ventricle: The cavity size was normal. Wall thickness was   increased in a pattern of mild LVH. There was mild focal basal   hypertrophy of the septum. Systolic function was normal.  The   estimated ejection fraction was in the range of 55% to 60%. Wall   motion was normal; there were no regional wall motion   abnormalities. The study is not technically sufficient to allow   evaluation of LV diastolic function. - Left atrium: The atrium was moderately dilated. - Pulmonary arteries: PA peak pressure: 34 mm Hg (S).  Impressions:  - Normal LV function; mild LVH; moderate LAE; cannot R/O left   atrial mass; suggest TEE to further assess.  Antimicrobials: None   Subjective: Patient seen and examined at bedside.  No overnight fever, nausea or vomiting.  No palpitations  Objective: Vitals:   08/14/18 0529 08/14/18 0819 08/14/18 0916 08/14/18 0917  BP: 126/73 137/84  (!) 133/103  Pulse: 84 60    Resp: 20  16   Temp: 97.7 F (36.5 C) (!) 97.5 F (36.4 C) 98.7 F (37.1 C)   TempSrc: Oral Oral Oral   SpO2: 96% 98% 97%   Weight: 122.3 kg     Height:        Intake/Output Summary (Last 24 hours) at 08/14/2018 9476 Last data filed at 08/14/2018 0200 Gross per 24 hour  Intake 807.15 ml  Output 200 ml  Net 607.15 ml   Filed Weights   08/12/18 1917 08/12/18 2043 08/14/18 0529  Weight: 120.7 kg 123.5 kg 122.3 kg    Examination:  General exam: Appears calm and comfortable  Respiratory system: Bilateral decreased breath sounds at bases Cardiovascular system: S1 & S2 heard, Rate controlled currently on my examination Gastrointestinal system: Abdomen is nondistended, soft and nontender. Normal bowel sounds heard. Extremities: No cyanosis, clubbing, edema    Data Reviewed: I  have personally reviewed following labs and imaging studies  CBC: Recent Labs  Lab 08/12/18 1443 08/12/18 1642 08/13/18 0304 08/14/18 0431  WBC 5.4 5.8 6.1 5.6  NEUTROABS 3.3 3.0  --  2.7  HGB 12.9* 13.0 12.3* 12.5*  HCT 39.5 39.3 37.9* 39.7  MCV 92.3 93.6 94.0 92.8  PLT 149 168 151 371   Basic Metabolic Panel: Recent Labs  Lab 08/12/18 1443 08/12/18 1642 08/12/18 2112  08/13/18 0304 08/14/18 0431  NA 142 145  --  138 138  K 3.9 4.0  --  3.8 3.8  CL 109 109  --  109 108  CO2 25 28  --  22 24  GLUCOSE 138* 71  --  98 98  BUN 16 17  --  14 12  CREATININE 1.03 1.11  --  0.88 0.96  CALCIUM 9.3 9.2  --  8.6* 8.9  MG  --   --  1.9  --  2.0   GFR: Estimated Creatinine Clearance: 98 mL/min (by C-G formula based on SCr of 0.96 mg/dL). Liver Function Tests: Recent Labs  Lab 08/12/18 1443 08/12/18 1642  AST 17 20  ALT 16 20  ALKPHOS 62 53  BILITOT 1.1 1.0  PROT 7.4 7.5  ALBUMIN 3.8 3.9   No results for input(s): LIPASE, AMYLASE in the last 168 hours. No results for input(s): AMMONIA in the last 168 hours. Coagulation Profile: Recent Labs  Lab 08/12/18 1642  INR 0.89   Cardiac Enzymes: Recent Labs  Lab 08/12/18 1642 08/12/18 2112 08/13/18 0304 08/13/18 0729  TROPONINI 0.04* 0.04* 0.04* 0.04*   BNP (last 3 results) No results for input(s): PROBNP in the last 8760 hours. HbA1C: No results for input(s): HGBA1C in the last 72 hours. CBG: No results for input(s): GLUCAP in the last 168 hours. Lipid Profile: Recent Labs    08/12/18 1642  CHOL 118  HDL 40*  LDLCALC 67  TRIG 56  CHOLHDL 3.0   Thyroid Function Tests: Recent Labs    08/12/18 1926  TSH 1.442   Anemia Panel: No results for input(s): VITAMINB12, FOLATE, FERRITIN, TIBC, IRON, RETICCTPCT in the last 72 hours. Sepsis Labs: No results for input(s): PROCALCITON, LATICACIDVEN in the last 168 hours.  Recent Results (from the past 240 hour(s))  MRSA PCR Screening     Status: None   Collection Time: 08/12/18  9:24 PM  Result Value Ref Range Status   MRSA by PCR NEGATIVE NEGATIVE Final    Comment:        The GeneXpert MRSA Assay (FDA approved for NASAL specimens only), is one component of a comprehensive MRSA colonization surveillance program. It is not intended to diagnose MRSA infection nor to guide or monitor treatment for MRSA infections. Performed at [facility], [place] [address] [place], [place] [address]          Radiology Studies: Dg Chest Port 1 View  Result Date: 08/12/2018 CLINICAL DATA:  Elevated protein levels and ST elevations on EKG. EXAM: PORTABLE CHEST 1 VIEW COMPARISON:  02/20/2017 FINDINGS: Borderline cardiomegaly with mild aortic atherosclerosis. No aortic aneurysm. No pulmonary consolidation or CHF. No effusion or pneumothorax. IMPRESSION: No active disease. Electronically Signed   By: Ashley Royalty M.D.   On: 08/12/2018 18:33        Scheduled Meds: . [MAR Hold] apixaban  5 mg Oral BID  . [MAR Hold] chlorpheniramine-HYDROcodone  5 mL Oral Once  . [MAR Hold] doxazosin  2 mg Oral QHS  . [  MAR Hold] losartan  100 mg Oral QHS  . [MAR Hold] metoprolol succinate  100 mg Oral QHS  . [MAR Hold] pravastatin  40 mg Oral QHS  . [MAR Hold] sodium chloride flush  3 mL Intravenous Q12H   Continuous Infusions: . sodium chloride 10 mL/hr at 08/14/18 0200  . sodium chloride    . sodium chloride    . diltiazem (CARDIZEM) infusion 7.5 mg/hr (08/14/18 0658)     LOS: 1 day        Aline August, MD [facility] Pager [phone]  If 7PM-7AM, please contact night-coverage [email or web address] Password [email or web address] 08/14/2018, 9:28 AM

## 2018-08-14 NOTE — Interval H&P Note (Signed)
History and Physical Interval Note:  08/14/2018 9:42 AM  Gordon Phillips  has presented today for surgery, with the diagnosis of a fib  The various methods of treatment have been discussed with the patient and family. After consideration of risks, benefits and other options for treatment, the patient has consented to  Procedure(s): TRANSESOPHAGEAL ECHOCARDIOGRAM (TEE) (N/A) CARDIOVERSION (N/A) as a surgical intervention .  The patient's history has been reviewed, patient examined, no change in status, stable for surgery.  I have reviewed the patient's chart and labs.  Questions were answered to the patient's satisfaction.     [employer]

## 2018-08-14 NOTE — Transfer of Care (Signed)
Immediate Anesthesia Transfer of Care Note  Patient: Gordon Phillips  Procedure(s) Performed: TRANSESOPHAGEAL ECHOCARDIOGRAM (TEE) (N/A ) CARDIOVERSION (N/A )  Patient Location: Endoscopy Unit  Anesthesia Type:MAC  Level of Consciousness: awake, alert , oriented and patient cooperative  Airway & Oxygen Therapy: Patient Spontanous Breathing and Patient connected to face mask oxygen  Post-op Assessment: Report given to RN and Post -op Vital signs reviewed and stable  Post vital signs: Reviewed and stable  Last Vitals:  Vitals Value Taken Time  BP 117/78 08/14/2018 10:35 AM  Temp    Pulse 62 08/14/2018 10:36 AM  Resp 26 08/14/2018 10:36 AM  SpO2 95 % 08/14/2018 10:36 AM  Vitals shown include unvalidated device data.  Last Pain:  Vitals:   08/14/18 1022  TempSrc: Oral  PainSc: 0-No pain      Patients Stated Pain Goal: 0 (45/36/46 8032)  Complications: No apparent anesthesia complications

## 2018-08-14 NOTE — Consult Note (Addendum)
Cardiology Consultation:   Patient ID: Gordon Phillips MRN: [id number]; DOB: 06-19-1946  Admit date: 08/12/2018 Date of Consult: 08/14/2018  Primary Care Provider: Marletta Lor, MD Primary Cardiologist: Gordon Klein, MD  Primary Electrophysiologist:  None    Patient Profile:   Gordon Phillips is a 72 y.o. male with a hx of HTN, OSA w/CPAP, MUGUS, CVA (by imaging), remote pericarditis, CBP, vitiligo and migraines who is being seen today for the evaluation of AFlutter at the request of Gordon Phillips.  History of Present Illness:   Gordon Phillips has hx of c/o CP back in 2014 shortly after taking the Relpax (triptan).BP was 240/140.Of note, he had an old lacunar infarct in L basal ganglia on Head CT (patient was unaware of prior stroke).Echo 02/03/13:Mild LVH, mild focal basal septal hypertrophy, EF 50-55%, Gr 1 DD, mild LAE.CEs remained negative.He was placed on a statin.In light of his remote stroke on CT and migraine HAs, OP neurology consultation was recommended.OP myoview was recommended as well.Lexiscan Myoview 02/06/13:EF 59%, no ischemia or scar.  With outpt c/o palpitations in 2016 Subsequent heart monitor showed NSR with PACs and no afib. 2D ECHO showed normal EF with moderate LVH.  Another hospital stay for c/o CP, 02/20/17 for chest pain and mildly elevated troponin: 0.7--> 0.7. Our recommendation was for inpatient heart catheterization; however, patient was very reluctant. He had a lot going on that week with out of town guests that he could not cancel on. Risks and benefits of both stress testing and heart catheterization were explained in great detail and patient was very clear that he would to proceed with stress testing. He was discharged home with outpatient nuclear stress test 02/22/17 that was overall low risk but showed EF 50%.  He has been doing well until his day of admission while at  [place] center today for routine follow up. He was tachycardic  on exam, and ECG performed showed sinus tachycardia with concern for elevation in the inferior leads. He was sent to the ER for evaluation, and CODE STEMI was activated. He was transferred to [facility] ER for further evaluation.  Ultimately not felt SETMI, noted with AFlutter perhaps triggered by a recent bronchitis.  He was started on Eliquis, and rate control with BB and dilt gtt, and supportive tx for suspected viral bronchitis.  TTE noted normal LVEF,  Mod MAE (57mm), underwent TEE/DCCV today with successful return of SR. EP is asked to evaluate for possible ablative strategy.  Admit LABS K+ 3.9 BUN.Creat 16/1.03 WBC 5.8 H/H 13/39 Plts 168  He reports last week when at the orthopedic for injection of his hip/back they would not proceed because his HR was too fast.  He was feeling well and unaware of any particular symptoms outside if his back pain.  When at his oncologist noted again with fast HR and did an EKG referring him to the ER.  Again without any particular symptoms.  He reports for a few months he has been intermittently feeling less energetic, some brief palpitations, and a few days here and there of "just not feeling great" but nothing more specific.  No CP or SOB.  Past Medical History:  Diagnosis Date  . ALLERGIC RHINITIS 01/25/2010  . Arthritis    "neck, back, shoulders," (02/20/2017)  . Chronic lower back pain   . High cholesterol   . Hx of cardiovascular stress test    a. Lexiscan Myoview 02/06/13:  EF 59%, no ischemia or scar  . Hx of echocardiogram  a.  Echo 02/03/13:  Mild LVH, mild focal basal septal hypertrophy, EF 50-55%, Gr 1 DD, mild LAE  . HYPERTENSION 01/25/2010  . HYPERTHYROIDISM 01/25/2010   S/P radioactive iodine "back in the 1980s" (02/20/2017)  . Impaired glucose tolerance   . 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 Obesity   . OSA on CPAP    Uses CPAP nightly  . Pericarditis 1980's  . Stroke [facility])    Old L basal  ganglia infarct by CT 01/2013.  Marland Kitchen Vitiligo     Past Surgical History:  Procedure Laterality Date  . INGUINAL HERNIA REPAIR Bilateral   . LUMBAR LAMINECTOMY    . UMBILICAL HERNIA REPAIR       Home Medications:  Prior to Admission medications   Medication Sig Start Date End Date Taking? Authorizing Provider  Aromatic Inhalants (VICKS VAPOINHALER) INHA Place 1 puff into both nostrils as needed (for congestion or stuffiness).   Yes [provider]  aspirin (BAYER ASPIRIN) 325 MG tablet Take 325 mg by mouth 3 (three) times a week.   Yes [provider]  AYR SALINE NASAL DROPS NA Place 2 sprays into both nostrils as needed (for congestion).   Yes [provider]  doxazosin (CARDURA) 2 MG tablet TAKE 1 TABLET AT BEDTIME Patient taking differently: Take 2 mg by mouth at bedtime.  04/23/18  Yes Gordon Lor, MD  fluticasone [facility]) 50 MCG/ACT nasal spray Place 2 sprays into both nostrils as needed for allergies or rhinitis. 04/23/18  Yes Gordon Lor, MD  losartan (COZAAR) 100 MG tablet Take 1 tablet (100 mg total) by mouth daily. Patient taking differently: Take 100 mg by mouth at bedtime.  04/23/18  Yes Gordon Lor, MD  metoprolol succinate (TOPROL-XL) 100 MG 24 hr tablet Take 1 tablet (100 mg total) by mouth daily. Take with or immediately following a meal. Patient taking differently: Take 100 mg by mouth at bedtime. Take with or immediately following a meal. 04/23/18  Yes Gordon Lor, MD  pravastatin (PRAVACHOL) 40 MG tablet Take 1 tablet (40 mg total) by mouth daily. Patient taking differently: Take 40 mg by mouth at bedtime.  04/23/18  Yes Gordon Lor, MD  PRESCRIPTION MEDICATION CPAP: At bedtime   Yes [provider]  sildenafil (VIAGRA) 100 MG tablet Take 100 mg by mouth daily as needed for erectile dysfunction.    Yes [provider]    Inpatient Medications: Scheduled Meds: . apixaban  5 mg Oral BID    . chlorpheniramine-HYDROcodone  5 mL Oral Once  . diltiazem  120 mg Oral Daily  . doxazosin  2 mg Oral QHS  . losartan  100 mg Oral QHS  . metoprolol succinate  100 mg Oral QHS  . pravastatin  40 mg Oral QHS  . sodium chloride flush  3 mL Intravenous Q12H   Continuous Infusions: . sodium chloride 10 mL/hr at 08/14/18 0200  . sodium chloride     PRN Meds: acetaminophen **OR** acetaminophen, fluticasone, guaiFENesin-dextromethorphan, menthol-cetylpyridinium, ondansetron **OR** ondansetron (ZOFRAN) IV, sodium chloride flush  Allergies:    Allergies  Allergen Reactions  . Flomax [Tamsulosin Hcl] Itching  . Relpax [Eletriptan] Other (See Comments) and Hypertension    Chest pain and sweating    Social History:   Social History   Socioeconomic History  . Marital status: Married    Spouse name: Not on file  . Number of children: 2  . Years  of education: Not on file  . Highest education level: Not on file  Occupational History  . Occupation: Geographical information systems officer  . Financial resource strain: Not on file  . Food insecurity:    Worry: Not on file    Inability: Not on file  . Transportation needs:    Medical: Not on file    Non-medical: Not on file  Tobacco Use  . Smoking status: Former Smoker    Packs/day: 0.50    Years: 8.00    Pack years: 4.00    Types: Cigarettes    Last attempt to quit: 1968    Years since quitting: 51.8  . Smokeless tobacco: Never Used  Substance and Sexual Activity  . Alcohol use: Yes    Comment: 02/20/2017 "couple drinks/month on average"  . Drug use: No  . Sexual activity: Yes  Lifestyle  . Physical activity:    Days per week: Not on file    Minutes per session: Not on file  . Stress: Not on file  Relationships  . Social connections:    Talks on phone: Not on file    Gets together: Not on file    Attends religious service: Not on file    Active member of club or organization: Not on file    Attends meetings of clubs or organizations:  Not on file    Relationship status: Not on file  . Intimate partner violence:    Fear of current or ex partner: Not on file    Emotionally abused: Not on file    Physically abused: Not on file    Forced sexual activity: Not on file  Other Topics Concern  . Not on file  Social History Narrative  . Not on file    Family History:   Family History  Problem Relation Age of Onset  . Stroke Father        Passed away in his 38s  . Thyroid disease Sister        goiter  . Sudden death Brother        ? Drug use, no autopsy  . Throat cancer Brother   . Colon cancer Neg Hx   . Esophageal cancer Neg Hx   . Rectal cancer Neg Hx   . Stomach cancer Neg Hx      ROS:  Please see the history of present illness.  All other ROS reviewed and negative.     Physical Exam/Data:   Vitals:   08/14/18 1022 08/14/18 1035 08/14/18 1045 08/14/18 1202  BP: 92/61 117/78 114/73 101/65  Pulse:  60 (!) 32 64  Resp: 16 17 (!) 28   Temp: 97.8 F (36.6 C)   97.6 F (36.4 C)  TempSrc: Oral   Oral  SpO2: 100% 98% 98% 95%  Weight:      Height:        Intake/Output Summary (Last 24 hours) at 08/14/2018 1226 Last data filed at 08/14/2018 1145 Gross per 24 hour  Intake 713.21 ml  Output 200 ml  Net 513.21 ml   Filed Weights   08/12/18 1917 08/12/18 2043 08/14/18 0529  Weight: 120.7 kg 123.5 kg 122.3 kg   Body mass index is 34.63 kg/m.  General:  Well nourished, well developed, in no acute distress HEENT: normal Lymph: no adenopathy Neck: no JVD Endocrine:  No thryomegaly Vascular: No carotid bruits Cardiac:  RRR; no murmurs, gallops or rubs Lungs:  CTA b/l, no wheezing, rhonchi or rales  Abd: soft,  nontender, no hepatomegaly  Ext: no edema Musculoskeletal:  No deformities Skin: warm and dry, areas/patches of hypopigmentation Neuro:  no focal abnormalities noted Psych:  Normal affect   EKG:  The EKG was personally reviewed and demonstrates:    08/12/18 AFlutter 125bpm 02/20/18 SR 77bpm,  PR 223ms, QRS 71ms, QTc 439ms  Telemetry:  Telemetry was personally reviewed and demonstrates:  AFlutter >. SR, 1st degree AVblock  Relevant CV Studies:  08/14/18: TEE/DCCV TEE was performed first. Left ventricular EF was approximately 45% Trivial mitral regurgitation Trivial tricuspid regurgitation Dilated left atrium and dilated right atrium Dilated right ventricle with normal systolic function No evidence of Doppler intraatrial shunt No evidence of left atrial appendage thrombus.  Cardioversion was performed with synchronized biphasic defibrillation via AP pads with 120 joules.  1 attempt(s) were performed.  The patient converted to normal sinus rhythm. The patient tolerated the procedure well  IMPRESSION: Successful cardioversion of atrial flutter.  08/13/18; TTE Study Conclusions - Left ventricle: The cavity size was normal. Wall thickness was   increased in a pattern of mild LVH. There was mild focal basal   hypertrophy of the septum. Systolic function was normal. The   estimated ejection fraction was in the range of 55% to 60%. Wall   motion was normal; there were no regional wall motion   abnormalities. The study is not technically sufficient to allow   evaluation of LV diastolic function. - Left atrium: The atrium was moderately dilated. - Pulmonary arteries: PA peak pressure: 34 mm Hg (S). Impressions: - Normal LV function; mild LVH; moderate LAE; cannot R/O left   atrial mass; suggest TEE to further assess.  Laboratory Data:  Chemistry Recent Labs  Lab 08/12/18 1642 08/13/18 0304 08/14/18 0431  NA 145 138 138  K 4.0 3.8 3.8  CL 109 109 108  CO2 28 22 24   GLUCOSE 71 98 98  BUN 17 14 12   CREATININE 1.11 0.88 0.96  CALCIUM 9.2 8.6* 8.9  GFRNONAA >60 >60 >60  GFRAA >60 >60 >60  ANIONGAP 8 7 6     Recent Labs  Lab 08/12/18 1443 08/12/18 1642  PROT 7.4 7.5  ALBUMIN 3.8 3.9  AST 17 20  ALT 16 20  ALKPHOS 62 53  BILITOT 1.1 1.0   Hematology Recent  Labs  Lab 08/12/18 1642 08/13/18 0304 08/14/18 0431  WBC 5.8 6.1 5.6  RBC 4.20* 4.03* 4.28  HGB 13.0 12.3* 12.5*  HCT 39.3 37.9* 39.7  MCV 93.6 94.0 92.8  MCH 31.0 30.5 29.2  MCHC 33.1 32.5 31.5  RDW 13.8 13.4 13.4  PLT 168 151 155   Cardiac Enzymes Recent Labs  Lab 08/12/18 1642 08/12/18 2112 08/13/18 0304 08/13/18 0729  TROPONINI 0.04* 0.04* 0.04* 0.04*    Recent Labs  Lab 08/12/18 1647  TROPIPOC 0.01    BNPNo results for input(s): BNP, PROBNP in the last 168 hours.  DDimer  Recent Labs  Lab 08/12/18 1926  DDIMER 0.74*    Radiology/Studies:   Dg Chest Port 1 View Result Date: 08/12/2018 CLINICAL DATA:  Elevated protein levels and ST elevations on EKG. EXAM: PORTABLE CHEST 1 VIEW COMPARISON:  02/20/2017 FINDINGS: Borderline cardiomegaly with mild aortic atherosclerosis. No aortic aneurysm. No pulmonary consolidation or CHF. No effusion or pneumothorax. IMPRESSION: No active disease. Electronically Signed   By: Ashley Royalty M.D.   On: 08/12/2018 18:33    Assessment and Plan:   1. New AFlutter, looks typical     CHA2DS2Vasc is 2, started on  Eliquis   The patient is very much interested in discussing with Dr. Caryl Comes possible EPS/ablation.   Dr. Caryl Comes will see later today      For questions or updates, please contact [facility] Please consult [email or web address] for contact info under     Signed, Baldwin Jamaica, PA-C  08/14/2018 12:26 PM   Thromboembolic risk factors ( age -18 , HTN-1, TIA/CVA-2,) for a CHADSVASc Score of =>5  With high CHADS-VASc score, would not be inclined to undertake catheter ablation with the hopes of discontinuing anticoagulation.  The patient is so agreeable.  However, he would like to undertake catheter ablation for the elimination of the atrial flutter; we discussed the likelihood of atrial fibrillation recurring and 50-70% of patients hereafter.  Understandably he would like to proceed.  We reviewed risks and benefits including  but not limited to bleeding perforation.

## 2018-08-14 NOTE — Progress Notes (Addendum)
Progress Note  Patient Name: Gordon Phillips Date of Encounter: 08/14/2018  Primary Cardiologist: Sanda Klein, MD   Subjective   No chest pain or shortness of breath. Spent >1 hour with him and his wife discussing all of the options. Initially there was no spot for TEE-CV today, so we discussed home and return for outpatient TEE/CV, TEE and then ablation possibly as an inpatient tomorrow and multiple other combinations of options. Patient and his wife had many questions and dissenting opinions. During our conversation, contacted by endoscopy team that there was a spot for TEE-CV that opened this AM. Patient is NPO and amenable to this plan.  Inpatient Medications    Scheduled Meds: . [MAR Hold] apixaban  5 mg Oral BID  . [MAR Hold] chlorpheniramine-HYDROcodone  5 mL Oral Once  . [MAR Hold] doxazosin  2 mg Oral QHS  . [MAR Hold] losartan  100 mg Oral QHS  . [MAR Hold] metoprolol succinate  100 mg Oral QHS  . [MAR Hold] pravastatin  40 mg Oral QHS  . [MAR Hold] sodium chloride flush  3 mL Intravenous Q12H   Continuous Infusions: . sodium chloride 10 mL/hr at 08/14/18 0200  . sodium chloride    . sodium chloride    . diltiazem (CARDIZEM) infusion 7.5 mg/hr (08/14/18 0658)   PRN Meds: [MVH Hold] acetaminophen **OR** [MAR Hold] acetaminophen, [MAR Hold] fluticasone, [MAR Hold] menthol-cetylpyridinium, [MAR Hold] ondansetron **OR** [MAR Hold] ondansetron (ZOFRAN) IV, [MAR Hold] sodium chloride flush   Vital Signs    Vitals:   08/13/18 2133 08/13/18 2140 08/14/18 0529 08/14/18 0819  BP: 123/69  126/73 137/84  Pulse:  61 84 60  Resp:  18 20   Temp:  97.9 F (36.6 C) 97.7 F (36.5 C) (!) 97.5 F (36.4 C)  TempSrc:  Oral Oral Oral  SpO2:  98% 96% 98%  Weight:   122.3 kg   Height:        Intake/Output Summary (Last 24 hours) at 08/14/2018 0915 Last data filed at 08/14/2018 0200 Gross per 24 hour  Intake 807.15 ml  Output 200 ml  Net 607.15 ml   Filed Weights   08/12/18  1917 08/12/18 2043 08/14/18 0529  Weight: 120.7 kg 123.5 kg 122.3 kg    Telemetry    Atrial flutter 90s-120 - Personally Reviewed  ECG    No new since 10/7 - Personally Reviewed  Physical Exam   GEN: moderately obese AAM in No acute distress.   Neck: No JVD Cardiac: irregularly irregular rhythm, tachy rate Respiratory: Clear to auscultation bilaterally. GI: Soft, nontender, non-distended  MS: No edema; No deformity. Neuro:  Nonfocal  Psych: Normal affect   Labs    Chemistry Recent Labs  Lab 08/12/18 1443 08/12/18 1642 08/13/18 0304 08/14/18 0431  NA 142 145 138 138  K 3.9 4.0 3.8 3.8  CL 109 109 109 108  CO2 25 28 22 24   GLUCOSE 138* 71 98 98  BUN 16 17 14 12   CREATININE 1.03 1.11 0.88 0.96  CALCIUM 9.3 9.2 8.6* 8.9  PROT 7.4 7.5  --   --   ALBUMIN 3.8 3.9  --   --   AST 17 20  --   --   ALT 16 20  --   --   ALKPHOS 62 53  --   --   BILITOT 1.1 1.0  --   --   GFRNONAA >60 >60 >60 >60  GFRAA >60 >60 >60 >60  ANIONGAP 8 8  7 6     Hematology Recent Labs  Lab 08/12/18 1642 08/13/18 0304 08/14/18 0431  WBC 5.8 6.1 5.6  RBC 4.20* 4.03* 4.28  HGB 13.0 12.3* 12.5*  HCT 39.3 37.9* 39.7  MCV 93.6 94.0 92.8  MCH 31.0 30.5 29.2  MCHC 33.1 32.5 31.5  RDW 13.8 13.4 13.4  PLT 168 151 155    Cardiac Enzymes Recent Labs  Lab 08/12/18 1642 08/12/18 2112 08/13/18 0304 08/13/18 0729  TROPONINI 0.04* 0.04* 0.04* 0.04*    Recent Labs  Lab 08/12/18 1647  TROPIPOC 0.01     BNPNo results for input(s): BNP, PROBNP in the last 168 hours.   DDimer  Recent Labs  Lab 08/12/18 1926  DDIMER 0.74*     Radiology    Dg Chest Port 1 View  Result Date: 08/12/2018 CLINICAL DATA:  Elevated protein levels and ST elevations on EKG. EXAM: PORTABLE CHEST 1 VIEW COMPARISON:  02/20/2017 FINDINGS: Borderline cardiomegaly with mild aortic atherosclerosis. No aortic aneurysm. No pulmonary consolidation or CHF. No effusion or pneumothorax. IMPRESSION: No active disease.  Electronically Signed   By: Ashley Royalty M.D.   On: 08/12/2018 18:33    Cardiac Studies   TTE 08/13/18 Study Conclusions  - Left ventricle: The cavity size was normal. Wall thickness was   increased in a pattern of mild LVH. There was mild focal basal   hypertrophy of the septum. Systolic function was normal. The   estimated ejection fraction was in the range of 55% to 60%. Wall   motion was normal; there were no regional wall motion   abnormalities. The study is not technically sufficient to allow   evaluation of LV diastolic function. - Left atrium: The atrium was moderately dilated. - Pulmonary arteries: PA peak pressure: 34 mm Hg (S).  Impressions:  - Normal LV function; mild LVH; moderate LAE; cannot R/O left   atrial mass; suggest TEE to further assess.    Patient Profile     Gordon Phillips is a 72 y.o. male with a hx of MGUS, back pain who was initially activated as a STEMI but found to be in new onset atrial flutter. Also being treated for bronchitis.   Assessment & Plan    1. New Onset Atrial flutter/fibrillation: likely was having for several weeks at home. Heart rate slightly better controlled on metoprolol and dilt drip, but still rising in the 120s routinely. -NPO, planned for TEE-CV today. See yesterday's note for consent. -continue apixaban 5 mg BID -echo with nl EF -monitoring electrolytes -flat troponins, likely 2/2 tachycardia -TSH normal -likely discharge tomorrow (his wife does not drive, he is the only driver in his house) if rates remain controlled. Continue on apixaban 5 mg BID at discharge. Discussed EP follow up for possibly ablation in the future.  2. Bronchitis: may be driving his tachycardia. Defer treatment of this to medicine team.  Additional issues: -MGUS: per medicine/onc -Chronic back pain -Hypertension: controlled on current regimen. -Impaired glucose tolerance  TIME SPENT WITH PATIENT: >60 minutes of direct patient care. More than  50% of that time was spent on coordination of care and counseling regarding atrial flutter treatment options, risk/benefits, management coordination and planning.  Buford Dresser, MD, PhD [facility]   For questions or updates, please contact [place] Please consult [email or web address] for contact info under     Signed, Buford Dresser, MD  08/14/2018, 9:15 AM

## 2018-08-14 NOTE — CV Procedure (Addendum)
    Transesophageal echocardiogram and electrical Cardioversion Procedure Note Gordon Phillips [id number] 1946-01-18  Procedure: Electrical Cardioversion Indications:  Atrial Flutter  Time Out: Verified patient identification, verified procedure,medications/allergies/relevent history reviewed, required imaging and test results available.  Performed  Procedure Details  The patient was NPO after midnight. Anesthesia was administered at the beside  by Dr.Hodiere with  propofol.    TEE was performed first. Left ventricular EF was approximately 45% Trivial mitral regurgitation Trivial tricuspid regurgitation Dilated left atrium and dilated right atrium Dilated right ventricle with normal systolic function No evidence of Doppler intraatrial shunt No evidence of left atrial appendage thrombus.   Cardioversion was performed with synchronized biphasic defibrillation via AP pads with 120 joules.  1 attempt(s) were performed.  The patient converted to normal sinus rhythm. The patient tolerated the procedure well   IMPRESSION:  Successful cardioversion of atrial flutter. May proceed with 4 weeks of Eliquis. At that time plan through electrophysiology will be to schedule outpatient atrial flutter ablation. I stopped his IV diltiazem drip and placed him on p.o. diltiazem CD 120 mg once a day    [employer] 08/14/2018, 10:20 AM

## 2018-08-14 NOTE — H&P (View-Only) (Signed)
Progress Note  Patient Name: Gordon Phillips Date of Encounter: 08/14/2018  Primary Cardiologist: Sanda Klein, MD   Subjective   No chest pain or shortness of breath. Spent >1 hour with him and his wife discussing all of the options. Initially there was no spot for TEE-CV today, so we discussed home and return for outpatient TEE/CV, TEE and then ablation possibly as an inpatient tomorrow and multiple other combinations of options. Patient and his wife had many questions and dissenting opinions. During our conversation, contacted by endoscopy team that there was a spot for TEE-CV that opened this AM. Patient is NPO and amenable to this plan.  Inpatient Medications    Scheduled Meds: . [MAR Hold] apixaban  5 mg Oral BID  . [MAR Hold] chlorpheniramine-HYDROcodone  5 mL Oral Once  . [MAR Hold] doxazosin  2 mg Oral QHS  . [MAR Hold] losartan  100 mg Oral QHS  . [MAR Hold] metoprolol succinate  100 mg Oral QHS  . [MAR Hold] pravastatin  40 mg Oral QHS  . [MAR Hold] sodium chloride flush  3 mL Intravenous Q12H   Continuous Infusions: . sodium chloride 10 mL/hr at 08/14/18 0200  . sodium chloride    . sodium chloride    . diltiazem (CARDIZEM) infusion 7.5 mg/hr (08/14/18 0658)   PRN Meds: [HQI Hold] acetaminophen **OR** [MAR Hold] acetaminophen, [MAR Hold] fluticasone, [MAR Hold] menthol-cetylpyridinium, [MAR Hold] ondansetron **OR** [MAR Hold] ondansetron (ZOFRAN) IV, [MAR Hold] sodium chloride flush   Vital Signs    Vitals:   08/13/18 2133 08/13/18 2140 08/14/18 0529 08/14/18 0819  BP: 123/69  126/73 137/84  Pulse:  61 84 60  Resp:  18 20   Temp:  97.9 F (36.6 C) 97.7 F (36.5 C) (!) 97.5 F (36.4 C)  TempSrc:  Oral Oral Oral  SpO2:  98% 96% 98%  Weight:   122.3 kg   Height:        Intake/Output Summary (Last 24 hours) at 08/14/2018 0915 Last data filed at 08/14/2018 0200 Gross per 24 hour  Intake 807.15 ml  Output 200 ml  Net 607.15 ml   Filed Weights   08/12/18  1917 08/12/18 2043 08/14/18 0529  Weight: 120.7 kg 123.5 kg 122.3 kg    Telemetry    Atrial flutter 90s-120 - Personally Reviewed  ECG    No new since 10/7 - Personally Reviewed  Physical Exam   GEN: moderately obese AAM in No acute distress.   Neck: No JVD Cardiac: irregularly irregular rhythm, tachy rate Respiratory: Clear to auscultation bilaterally. GI: Soft, nontender, non-distended  MS: No edema; No deformity. Neuro:  Nonfocal  Psych: Normal affect   Labs    Chemistry Recent Labs  Lab 08/12/18 1443 08/12/18 1642 08/13/18 0304 08/14/18 0431  NA 142 145 138 138  K 3.9 4.0 3.8 3.8  CL 109 109 109 108  CO2 25 28 22 24   GLUCOSE 138* 71 98 98  BUN 16 17 14 12   CREATININE 1.03 1.11 0.88 0.96  CALCIUM 9.3 9.2 8.6* 8.9  PROT 7.4 7.5  --   --   ALBUMIN 3.8 3.9  --   --   AST 17 20  --   --   ALT 16 20  --   --   ALKPHOS 62 53  --   --   BILITOT 1.1 1.0  --   --   GFRNONAA >60 >60 >60 >60  GFRAA >60 >60 >60 >60  ANIONGAP 8 8  7 6     Hematology Recent Labs  Lab 08/12/18 1642 08/13/18 0304 08/14/18 0431  WBC 5.8 6.1 5.6  RBC 4.20* 4.03* 4.28  HGB 13.0 12.3* 12.5*  HCT 39.3 37.9* 39.7  MCV 93.6 94.0 92.8  MCH 31.0 30.5 29.2  MCHC 33.1 32.5 31.5  RDW 13.8 13.4 13.4  PLT 168 151 155    Cardiac Enzymes Recent Labs  Lab 08/12/18 1642 08/12/18 2112 08/13/18 0304 08/13/18 0729  TROPONINI 0.04* 0.04* 0.04* 0.04*    Recent Labs  Lab 08/12/18 1647  TROPIPOC 0.01     BNPNo results for input(s): BNP, PROBNP in the last 168 hours.   DDimer  Recent Labs  Lab 08/12/18 1926  DDIMER 0.74*     Radiology    Dg Chest Port 1 View  Result Date: 08/12/2018 CLINICAL DATA:  Elevated protein levels and ST elevations on EKG. EXAM: PORTABLE CHEST 1 VIEW COMPARISON:  02/20/2017 FINDINGS: Borderline cardiomegaly with mild aortic atherosclerosis. No aortic aneurysm. No pulmonary consolidation or CHF. No effusion or pneumothorax. IMPRESSION: No active disease.  Electronically Signed   By: Gordon Phillips M.D.   On: 08/12/2018 18:33    Cardiac Studies   TTE 08/13/18 Study Conclusions  - Left ventricle: The cavity size was normal. Wall thickness was   increased in a pattern of mild LVH. There was mild focal basal   hypertrophy of the septum. Systolic function was normal. The   estimated ejection fraction was in the range of 55% to 60%. Wall   motion was normal; there were no regional wall motion   abnormalities. The study is not technically sufficient to allow   evaluation of LV diastolic function. - Left atrium: The atrium was moderately dilated. - Pulmonary arteries: PA peak pressure: 34 mm Hg (S).  Impressions:  - Normal LV function; mild LVH; moderate LAE; cannot R/O left   atrial mass; suggest TEE to further assess.    Patient Profile     Gordon Phillips is a 72 y.o. male with a hx of MGUS, back pain who was initially activated as a STEMI but found to be in new onset atrial flutter. Also being treated for bronchitis.   Assessment & Plan    1. New Onset Atrial flutter/fibrillation: likely was having for several weeks at home. Heart rate slightly better controlled on metoprolol and dilt drip, but still rising in the 120s routinely. -NPO, planned for TEE-CV today. See yesterday's note for consent. -continue apixaban 5 mg BID -echo with nl EF -monitoring electrolytes -flat troponins, likely 2/2 tachycardia -TSH normal -likely discharge tomorrow (his wife does not drive, he is the only driver in his house) if rates remain controlled. Continue on apixaban 5 mg BID at discharge. Discussed EP follow up for possibly ablation in the future.  2. Bronchitis: may be driving his tachycardia. Defer treatment of this to medicine team.  Additional issues: -MGUS: per medicine/onc -Chronic back pain -Hypertension: controlled on current regimen. -Impaired glucose tolerance  TIME SPENT WITH PATIENT: >60 minutes of direct patient care. More than  50% of that time was spent on coordination of care and counseling regarding atrial flutter treatment options, risk/benefits, management coordination and planning.  Buford Dresser, MD, PhD [facility]   For questions or updates, please contact [place] Please consult [email or web address] for contact info under     Signed, Buford Dresser, MD  08/14/2018, 9:15 AM

## 2018-08-15 LAB — BASIC METABOLIC PANEL
Anion gap: 4 — ABNORMAL LOW (ref 5–15)
BUN: 17 mg/dL (ref 8–23)
CO2: 25 mmol/L (ref 22–32)
Calcium: 8.7 mg/dL — ABNORMAL LOW (ref 8.9–10.3)
Chloride: 110 mmol/L (ref 98–111)
Creatinine, Ser: 1.12 mg/dL (ref 0.61–1.24)
GFR calc Af Amer: >60 mL/min (ref >60)
GFR calc non Af Amer: >60 mL/min (ref >60)
Glucose, Bld: 98 mg/dL (ref 70–99)
Potassium: 4.3 mmol/L (ref 3.5–5.1)
Sodium: 139 mmol/L (ref 135–145)

## 2018-08-15 LAB — MAGNESIUM: Magnesium: 2.1 mg/dL (ref 1.7–2.4)

## 2018-08-15 MED ORDER — GUAIFENESIN-DM 100-10 MG/5ML PO SYRP: 5.0000 mL | ORAL_SOLUTION | ORAL | 0 refills | Status: DC | PRN

## 2018-08-15 MED ORDER — LOSARTAN POTASSIUM 100 MG PO TABS: 100.0000 mg | ORAL_TABLET | Freq: Every day | ORAL | Status: DC

## 2018-08-15 MED ORDER — DILTIAZEM HCL ER COATED BEADS 120 MG PO CP24: 120.0000 mg | ORAL_CAPSULE | Freq: Every day | ORAL | 0 refills | Status: DC

## 2018-08-15 MED ORDER — APIXABAN 5 MG PO TABS: 5.0000 mg | ORAL_TABLET | Freq: Two times a day (BID) | ORAL | 0 refills | Status: DC

## 2018-08-15 MED ORDER — PRAVASTATIN SODIUM 40 MG PO TABS: 40.0000 mg | ORAL_TABLET | Freq: Every day | ORAL | Status: DC

## 2018-08-15 MED ORDER — METOPROLOL SUCCINATE ER 100 MG PO TB24: 100.0000 mg | ORAL_TABLET | Freq: Every day | ORAL | Status: DC

## 2018-08-15 NOTE — Discharge Summary (Signed)
Physician Discharge Summary  Gordon Phillips FTD:[id number] DOB: 01/09/1946 DOA: 08/12/2018  PCP: Marletta Lor, MD  Admit date: 08/12/2018 Discharge date: 08/15/2018  Admitted From: Home Disposition: Home  Recommendations for Outpatient Follow-up:  1. Follow up with PCP in 1 week   Home Health: No Equipment/Devices: None  Discharge Condition: Stable CODE STATUS: Full Diet recommendation: Heart Healthy  Brief/Interim Summary: 72 year old male with history of hypertension, MGUS, hyperlipidemia and chronic back pain was noticed to be tachycardic at the hematology clinic and was referred to the ER.  Patient was found to have atrial flutter with RVR and started on Cardizem drip.  Cardiology was consulted.  Patient was also started on anticoagulation.  Patient underwent TEE and DCCV by cardiology on 08/14/2018; subsequently Cardizem drip was switched to oral Cardizem.  EP was consulted who recommended outpatient follow-up.  Patient will be discharged today if cleared by cardiology.   Discharge Diagnoses:  Principal Problem:   Atrial flutter with rapid ventricular response (HCC) Active Problems:   Essential hypertension   OSA (obstructive sleep apnea)   MGUS (monoclonal gammopathy of unknown significance)   History of CVA (cerebrovascular accident)  Atrial flutter with RVR -Status post TEE/DCCV on 08/14/2018 by cardiology.  Patient was initially on Cardizem drip which was subsequently switched to oral Cardizem by cardiology.  Continue Toprol-XL.  Patient has been started on Eliquis which will be continued. -Currently rate controlled.  Outpatient follow-up with cardiology.  Patient was also evaluated by EP who will schedule outpatient follow-up. -Echo showed EF of 55 to 60%  Probable viral bronchitis -Improving. -Currently no wheezing and no hypoxia.  Chest x-ray unremarkable.  Continue supportive treatment  Essential hypertension -Monitor blood pressure.  Continue metoprolol,  losartan and Cardizem.  Hyperlipidemia -Continue statin  MGUS -Stable.  Followed by oncology.  Outpatient follow-up  OSA on CPAP -Outpatient follow-up  Obesity -Outpatient follow-up  Discharge Instructions  Discharge Instructions    Call MD for:  difficulty breathing, headache or visual disturbances   Complete by:  As directed    Call MD for:  extreme fatigue   Complete by:  As directed    Call MD for:  hives   Complete by:  As directed    Call MD for:  persistant dizziness or light-headedness   Complete by:  As directed    Call MD for:  persistant nausea and vomiting   Complete by:  As directed    Call MD for:  severe uncontrolled pain   Complete by:  As directed    Call MD for:  temperature >100.4   Complete by:  As directed    Diet - low sodium heart healthy   Complete by:  As directed    Increase activity slowly   Complete by:  As directed      Allergies as of 08/15/2018      Reactions   Flomax [tamsulosin Hcl] Itching   Relpax [eletriptan] Other (See Comments), Hypertension   Chest pain and sweating      Medication List    TAKE these medications   apixaban 5 MG Tabs tablet Commonly known as:  ELIQUIS Take 1 tablet (5 mg total) by mouth 2 (two) times daily.   AYR SALINE NASAL DROPS NA Place 2 sprays into both nostrils as needed (for congestion).   BAYER ASPIRIN 325 MG tablet Generic drug:  aspirin Take 325 mg by mouth 3 (three) times a week.   diltiazem 120 MG 24 hr capsule Commonly known as:  CARDIZEM  CD Take 1 capsule (120 mg total) by mouth daily. Start taking on:  08/16/2018   doxazosin 2 MG tablet Commonly known as:  CARDURA TAKE 1 TABLET AT BEDTIME   fluticasone 50 MCG/ACT nasal spray Commonly known as:  FLONASE Place 2 sprays into both nostrils as needed for allergies or rhinitis.   guaiFENesin-dextromethorphan 100-10 MG/5ML syrup Commonly known as:  ROBITUSSIN DM Take 5 mLs by mouth every 4 (four) hours as needed for cough.    losartan 100 MG tablet Commonly known as:  COZAAR Take 1 tablet (100 mg total) by mouth at bedtime.   metoprolol succinate 100 MG 24 hr tablet Commonly known as:  TOPROL-XL Take 1 tablet (100 mg total) by mouth at bedtime. Take with or immediately following a meal.   pravastatin 40 MG tablet Commonly known as:  PRAVACHOL Take 1 tablet (40 mg total) by mouth at bedtime.   PRESCRIPTION MEDICATION CPAP: At bedtime   sildenafil 100 MG tablet Commonly known as:  VIAGRA Take 100 mg by mouth daily as needed for erectile dysfunction.   VICKS VAPOINHALER Inha Place 1 puff into both nostrils as needed (for congestion or stuffiness).      Follow-up Information    Buford Dresser, MD Follow up on 10/14/2018.   Specialty:  Cardiology Why:  10/14/2018 at 10am with Dr. Myrlene Broker information: [place] [address] [phone]        Marletta Lor, MD. Schedule an appointment as soon as possible for a visit in 1 week(s).   Specialty:  Internal Medicine Contact information: [place] [address] [phone]        Sanda Klein, MD .   Specialty:  Cardiology Contact information: [address] [place] [address] [place] [address] [phone]          Allergies  Allergen Reactions  . Flomax [Tamsulosin Hcl] Itching  . Relpax [Eletriptan] Other (See Comments) and Hypertension    Chest pain and sweating    Consultations:  Cardiology/EP   Procedures/Studies: Dg Chest Port 1 View  Result Date: 08/12/2018 CLINICAL DATA:  Elevated protein levels and ST elevations on EKG. EXAM: PORTABLE CHEST 1 VIEW COMPARISON:  02/20/2017 FINDINGS: Borderline cardiomegaly with mild aortic atherosclerosis. No aortic aneurysm. No pulmonary consolidation or CHF. No effusion or pneumothorax. IMPRESSION: No active disease. Electronically Signed   By: Ashley Royalty M.D.   On: 08/12/2018 18:33    TEE/DCCV on  08/14/2018 TEE was performed first. Left ventricular EF was approximately 45% Trivial mitral regurgitation Trivial tricuspid regurgitation Dilated left atrium and dilated right atrium Dilated right ventricle with normal systolic function No evidence of Doppler intraatrial shunt No evidence of left atrial appendage thrombus.   Cardioversion was performed with synchronized biphasic defibrillation via AP pads with 120 joules.  1 attempt(s) were performed.  The patient converted to normal sinus rhythm. The patient tolerated the procedure well   IMPRESSION:  Successful cardioversion of atrial flutter. May proceed with 4 weeks of Eliquis. At that time plan through electrophysiology will be to schedule outpatient atrial flutter ablation. I stopped his IV diltiazem drip and placed him on p.o. diltiazem CD 120 mg once a day  Echo on 08/13/2018 Study Conclusions  - Left ventricle: The cavity size was normal. Wall thickness was increased in a pattern of mild LVH. There was mild focal basal hypertrophy of the septum. Systolic function was normal. The estimated ejection fraction was in the range of 55% to 60%.  Wall motion was normal; there were no regional wall motion abnormalities. The study is not technically sufficient to allow evaluation of LV diastolic function. - Left atrium: The atrium was moderately dilated. - Pulmonary arteries: PA peak pressure: 34 mm Hg (S).  Impressions:  - Normal LV function; mild LVH; moderate LAE; cannot R/O left atrial mass; suggest TEE to further assess.  Subjective: Patient seen and examined at bedside.  He denies any overnight fever, nausea, vomiting or palpitation.  He wants to go home.  Discharge Exam: Vitals:   08/15/18 0625 08/15/18 0933  BP: (!) 95/57 114/75  Pulse: 67   Resp:    Temp: 97.6 F (36.4 C)   SpO2: 98%    Vitals:   08/14/18 2130 08/14/18 2134 08/15/18 0625 08/15/18 0933  BP: 111/72 111/72 (!) 95/57 114/75   Pulse:  73 67   Resp:      Temp:   97.6 F (36.4 C)   TempSrc:   Oral   SpO2:   98%   Weight:   122.8 kg   Height:        General: Pt is alert, awake, not in acute distress Cardiovascular: rate controlled, S1/S2 + Respiratory: bilateral decreased breath sounds at bases Abdominal: Soft, NT, ND, bowel sounds + Extremities: no edema, no cyanosis    The results of significant diagnostics from this hospitalization (including imaging, microbiology, ancillary and laboratory) are listed below for reference.     Microbiology: Recent Results (from the past 240 hour(s))  MRSA PCR Screening     Status: None   Collection Time: 08/12/18  9:24 PM  Result Value Ref Range Status   MRSA by PCR NEGATIVE NEGATIVE Final    Comment:        The GeneXpert MRSA Assay (FDA approved for NASAL specimens only), is one component of a comprehensive MRSA colonization surveillance program. It is not intended to diagnose MRSA infection nor to guide or monitor treatment for MRSA infections. Performed at [facility], [place] [address], [place], [place] [address]      Labs: BNP (last 3 results) No results for input(s): BNP in the last 8760 hours. Basic Metabolic Panel: Recent Labs  Lab 08/12/18 1443 08/12/18 1642 08/12/18 2112 08/13/18 0304 08/14/18 0431 08/15/18 0339  NA 142 145  --  138 138 139  K 3.9 4.0  --  3.8 3.8 4.3  CL 109 109  --  109 108 110  CO2 25 28  --  22 24 25   GLUCOSE 138* 71  --  98 98 98  BUN 16 17  --  14 12 17   CREATININE 1.03 1.11  --  0.88 0.96 1.12  CALCIUM 9.3 9.2  --  8.6* 8.9 8.7*  MG  --   --  1.9  --  2.0 2.1   Liver Function Tests: Recent Labs  Lab 08/12/18 1443 08/12/18 1642  AST 17 20  ALT 16 20  ALKPHOS 62 53  BILITOT 1.1 1.0  PROT 7.4 7.5  ALBUMIN 3.8 3.9   No results for input(s): LIPASE, AMYLASE in the last 168 hours. No results for input(s): AMMONIA in the last 168 hours. CBC: Recent Labs  Lab 08/12/18 1443 08/12/18 1642  08/13/18 0304 08/14/18 0431  WBC 5.4 5.8 6.1 5.6  NEUTROABS 3.3 3.0  --  2.7  HGB 12.9* 13.0 12.3* 12.5*  HCT 39.5 39.3 37.9* 39.7  MCV 92.3 93.6 94.0 92.8  PLT 149 168 151 155   Cardiac Enzymes:  Recent Labs  Lab 08/12/18 1642 08/12/18 2112 08/13/18 0304 08/13/18 0729  TROPONINI 0.04* 0.04* 0.04* 0.04*   BNP: Invalid input(s): POCBNP CBG: No results for input(s): GLUCAP in the last 168 hours. D-Dimer Recent Labs    08/12/18 1926  DDIMER 0.74*   Hgb A1c No results for input(s): HGBA1C in the last 72 hours. Lipid Profile Recent Labs    08/12/18 1642  CHOL 118  HDL 40*  LDLCALC 67  TRIG 56  CHOLHDL 3.0   Thyroid function studies Recent Labs    08/12/18 1926  TSH 1.442   Anemia work up No results for input(s): VITAMINB12, FOLATE, FERRITIN, TIBC, IRON, RETICCTPCT in the last 72 hours. Urinalysis    Component Value Date/Time   COLORURINE YELLOW 02/03/2013 0140   APPEARANCEUR CLEAR 02/03/2013 0140   LABSPEC 1.023 02/03/2013 0140   PHURINE 7.0 02/03/2013 0140   GLUCOSEU NEGATIVE 02/03/2013 0140   HGBUR NEGATIVE 02/03/2013 0140   BILIRUBINUR n 04/27/2014 1123   KETONESUR NEGATIVE 02/03/2013 0140   PROTEINUR n 04/27/2014 1123   PROTEINUR NEGATIVE 02/03/2013 0140   UROBILINOGEN 0.2 04/27/2014 1123   UROBILINOGEN 1.0 02/03/2013 0140   NITRITE n 04/27/2014 1123   NITRITE NEGATIVE 02/03/2013 0140   LEUKOCYTESUR Negative 04/27/2014 1123   Sepsis Labs Invalid input(s): PROCALCITONIN,  WBC,  LACTICIDVEN Microbiology Recent Results (from the past 240 hour(s))  MRSA PCR Screening     Status: None   Collection Time: 08/12/18  9:24 PM  Result Value Ref Range Status   MRSA by PCR NEGATIVE NEGATIVE Final    Comment:        The GeneXpert MRSA Assay (FDA approved for NASAL specimens only), is one component of a comprehensive MRSA colonization surveillance program. It is not intended to diagnose MRSA infection nor to guide or monitor treatment for MRSA  infections. Performed at [facility], [place] [address] [place], [place] [address]      Time coordinating discharge: 35 minutes  SIGNED:   Aline August, MD  [facility] 08/15/2018, 9:55 AM Pager: [phone]  If 7PM-7AM, please contact night-coverage [email or web address] Password [email or web address]

## 2018-08-15 NOTE — Anesthesia Postprocedure Evaluation (Signed)
Anesthesia Post Note  Patient: Gordon Phillips  Procedure(s) Performed: TRANSESOPHAGEAL ECHOCARDIOGRAM (TEE) (N/A ) CARDIOVERSION (N/A )     Patient location during evaluation: PACU Anesthesia Type: General Level of consciousness: awake and alert Pain management: pain level controlled Vital Signs Assessment: post-procedure vital signs reviewed and stable Respiratory status: spontaneous breathing, nonlabored ventilation, respiratory function stable and patient connected to nasal cannula oxygen Cardiovascular status: blood pressure returned to baseline and stable Postop Assessment: no apparent nausea or vomiting Anesthetic complications: no    Last Vitals:  Vitals:   08/14/18 2134 08/15/18 0625  BP: 111/72 (!) 95/57  Pulse: 73 67  Resp:    Temp:  36.4 C  SpO2:  98%    Last Pain:  Vitals:   08/15/18 0625  TempSrc: Oral  PainSc:                  [place]

## 2018-08-15 NOTE — Care Management (Signed)
1115 08-15-18 Jacqlyn Krauss, RN,BSN [phone] CM did provide patient with 30 day free card for Eliquis. Patient will receive co pay cost from Pharmacy. No further needs from CM at this time.

## 2018-08-15 NOTE — Progress Notes (Signed)
   Discussed plan of care with patient. Will need Jury Duty note at discharge. Pt's car is located at [facility] and has no way to pick the car up. Likely will need Case Management for Taxi voucher or other transportation assistance as wife does not drive. We have made follow up appointment with Dr. Harrell Gave. Will be seen OP with EP for ablation in approximately 3-4 weeks.    Kathyrn Drown NP-C [place] Pager: [phone]

## 2018-08-15 NOTE — Progress Notes (Signed)
Progress Note  Patient Name: Gordon Phillips Date of Encounter: 08/15/2018  Primary Cardiologist: Sanda Klein, MD   Subjective   Pt doing well today. No complaints this AM. Denies chest pain or palpitations. TEE/DCCV performed 10/10 to NSR to 1st degree block.   Inpatient Medications    Scheduled Meds: . apixaban  5 mg Oral BID  . diltiazem  120 mg Oral Daily  . doxazosin  2 mg Oral QHS  . losartan  100 mg Oral QHS  . metoprolol succinate  100 mg Oral QHS  . pravastatin  40 mg Oral QHS  . sodium chloride flush  3 mL Intravenous Q12H   Continuous Infusions: . sodium chloride 10 mL/hr at 08/14/18 0200  . sodium chloride     PRN Meds: acetaminophen **OR** acetaminophen, fluticasone, guaiFENesin-dextromethorphan, menthol-cetylpyridinium, ondansetron **OR** ondansetron (ZOFRAN) IV, sodium chloride flush   Vital Signs    Vitals:   08/14/18 1953 08/14/18 2130 08/14/18 2134 08/15/18 0625  BP: 132/73 111/72 111/72 (!) 95/57  Pulse: 77  73 67  Resp:      Temp: 98.1 F (36.7 C)   97.6 F (36.4 C)  TempSrc: Oral   Oral  SpO2: 99%   98%  Weight:    122.8 kg  Height:        Intake/Output Summary (Last 24 hours) at 08/15/2018 0810 Last data filed at 08/14/2018 2214 Gross per 24 hour  Intake 440 ml  Output -  Net 440 ml   Filed Weights   08/12/18 2043 08/14/18 0529 08/15/18 0625  Weight: 123.5 kg 122.3 kg 122.8 kg    Physical Exam   General: Well developed, well nourished, NAD Skin: Warm, dry, intact  Head: Normocephalic, atraumatic, clear, moist mucus membranes. Neck: Negative for carotid bruits. No JVD Lungs: Diminished bilaterally. No wheezes, rales, or rhonchi. Breathing is unlabored. Cardiovascular: RRR with S1 S2. No murmurs, rubs, gallops, or LV heave appreciated. Abdomen: Soft, non-tender, non-distended with normoactive bowel sounds. No obvious abdominal masses. MSK: Strength and tone appear normal for age. 5/5 in all extremities Extremities: No  edema. No clubbing or cyanosis. DP/PT pulses 2+ bilaterally Neuro: Alert and oriented. No focal deficits. No facial asymmetry. MAE spontaneously. Psych: Responds to questions appropriately with normal affect.    Labs    Chemistry Recent Labs  Lab 08/12/18 1443  08/12/18 1642 08/13/18 0304 08/14/18 0431 08/15/18 0339  NA 142  --  145 138 138 139  K 3.9  --  4.0 3.8 3.8 4.3  CL 109  --  109 109 108 110  CO2 25  --  28 22 24 25   GLUCOSE 138*  --  71 98 98 98  BUN 16  --  17 14 12 17   CREATININE 1.03   < > 1.11 0.88 0.96 1.12  CALCIUM 9.3  --  9.2 8.6* 8.9 8.7*  PROT 7.4  --  7.5  --   --   --   ALBUMIN 3.8  --  3.9  --   --   --   AST 17  --  20  --   --   --   ALT 16  --  20  --   --   --   ALKPHOS 62  --  53  --   --   --   BILITOT 1.1  --  1.0  --   --   --   GFRNONAA >60   < > >60 >60 >60 >60  GFRAA >60   < > >60 >60 >60 >60  ANIONGAP 8  --  8 7 6  4*   < > = values in this interval not displayed.     Hematology Recent Labs  Lab 08/12/18 1642 08/13/18 0304 08/14/18 0431  WBC 5.8 6.1 5.6  RBC 4.20* 4.03* 4.28  HGB 13.0 12.3* 12.5*  HCT 39.3 37.9* 39.7  MCV 93.6 94.0 92.8  MCH 31.0 30.5 29.2  MCHC 33.1 32.5 31.5  RDW 13.8 13.4 13.4  PLT 168 151 155    Cardiac Enzymes Recent Labs  Lab 08/12/18 1642 08/12/18 2112 08/13/18 0304 08/13/18 0729  TROPONINI 0.04* 0.04* 0.04* 0.04*    Recent Labs  Lab 08/12/18 1647  TROPIPOC 0.01     BNPNo results for input(s): BNP, PROBNP in the last 168 hours.   DDimer  Recent Labs  Lab 08/12/18 1926  DDIMER 0.74*     Radiology    No results found.  Telemetry    08/15/18 NSR with 1st degree AV block - Personally Reviewed  ECG    Will obtain post procedure EKG - Personally Reviewed  Cardiac Studies   TTE 08/13/18 Study Conclusions  - Left ventricle: The cavity size was normal. Wall thickness was increased in a pattern of mild LVH. There was mild focal basal hypertrophy of the septum. Systolic  function was normal. The estimated ejection fraction was in the range of 55% to 60%. Wall motion was normal; there were no regional wall motion abnormalities. The study is not technically sufficient to allow evaluation of LV diastolic function. - Left atrium: The atrium was moderately dilated. - Pulmonary arteries: PA peak pressure: 34 mm Hg (S).  Impressions:  - Normal LV function; mild LVH; moderate LAE; cannot R/O left atrial mass; suggest TEE to further assess.  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 Also being treated for bronchitis.  Assessment & Plan    1.  New onset atrial flutter/fibrillation: -Heart rate>>>60-70bpm>>>will obtain post-procedure EKG   -Underwent successful TEE/DCCV for atrial flutter with 120 J on 08/14/2018 with return to NSR -Continue Eliquis 5 mg twice daily for four weeks and plan is to follow with OP EP at that time to discuss ablation -IV diltiazem discontinued>>> diltiazem CD 120 mg daily initiated -Last echocardiogram with normal LVEF at 55 to 60% -Troponins mildly elevated 0.04>0.04>0.04 however flat>>likely in the setting of tachycardia -EP follow-up for possible ablation after Eliquis x 4 weeks   2.  Viral bronchitis: -Stable, no wheezing or hypoxia -CXR unremarkable -Per primary team  3.  Hypertension: -Stable, continue metoprolol, losartan and Cardizem  4.  HLD: -Stable, continue statin  Signed, Kathyrn Drown NP-C [facility] Pager: [phone] 08/15/2018, 8:10 AM     For questions or updates, please contact   Please consult [email or web address] for contact info under Cardiology/STEMI.

## 2018-08-16 ENCOUNTER — Encounter (HOSPITAL_COMMUNITY): Payer: Self-pay | Admitting: Cardiology

## 2018-08-16 ENCOUNTER — Telehealth: Payer: Self-pay | Admitting: Family Medicine

## 2018-08-16 NOTE — Consult Note (Signed)
            [facility] CM Primary Care Navigator  08/16/2018  Lorrie Strauch [birth year] [id number]   Attemptto seepatient at the bedside to identify possible discharge needs buthe wasalreadydischargedhomeper staff.  Per MD note,patient noted to be tachycardic at the hematology clinic and was referred to the ER. He was found to have atrial flutter with RVR- rapid ventricular response, was admitted and underwent cardioversion.   Primary care provider's office is listed as providing transition of care (TOC) follow-up.  Patient has discharge instruction to follow-up with primary care provider in 1 week and cardiology follow-up on 09/06/18.   For additional questions please contact:  Edwena Felty A. Zena Vitelli, BSN, RN-BC [facility] PRIMARY CARE Navigator Cell: [phone]

## 2018-08-16 NOTE — Telephone Encounter (Signed)
Transition Care Management Follow-up Telephone Call   Date discharged? 08/15/18   How have you been since you were released from the hospital? "So far so good"   Do you understand why you were in the hospital? yes   Do you understand the discharge instructions? yes   Where were you discharged to? Home   Items Reviewed:  Medications reviewed: yes  Allergies reviewed: yes  Dietary changes reviewed: None Given  Referrals reviewed: yes   Functional Questionnaire:   Activities of Daily Living (ADLs):   He states they are independent in the following: ambulation, bathing and hygiene, feeding, continence, grooming, toileting and dressing States they require assistance with the following: No assistance needed   Any transportation issues/concerns?: no   Any patient concerns? yes, Pt is currently taking Eliquis.  Should he continue to take ASA 325 mg?  He does not take it daily.  Uses 2 pills three times daily while at work for back pain.   Confirmed importance and date/time of follow-up visits scheduled yes  Provider Appointment booked with Dr. Dimas Chyle.  Confirmed with patient if condition begins to worsen call PCP or go to the ER.  Patient was given the office number and encouraged to call back with question or concerns.  : yes

## 2018-08-20 ENCOUNTER — Ambulatory Visit (INDEPENDENT_AMBULATORY_CARE_PROVIDER_SITE_OTHER): Payer: Medicare Other | Admitting: Family Medicine

## 2018-08-20 VITALS — BP 126/84 | HR 75 | Temp 98.3°F | Ht 72.0 in | Wt 271.0 lb

## 2018-08-20 DIAGNOSIS — N400 Enlarged prostate without lower urinary tract symptoms: Secondary | ICD-10-CM | POA: Insufficient documentation

## 2018-08-20 DIAGNOSIS — E7800 Pure hypercholesterolemia, unspecified: Secondary | ICD-10-CM | POA: Diagnosis not present

## 2018-08-20 DIAGNOSIS — I4892 Unspecified atrial flutter: Secondary | ICD-10-CM

## 2018-08-20 DIAGNOSIS — R05 Cough: Secondary | ICD-10-CM

## 2018-08-20 DIAGNOSIS — I10 Essential (primary) hypertension: Secondary | ICD-10-CM

## 2018-08-20 DIAGNOSIS — K6289 Other specified diseases of anus and rectum: Secondary | ICD-10-CM | POA: Insufficient documentation

## 2018-08-20 DIAGNOSIS — J342 Deviated nasal septum: Secondary | ICD-10-CM | POA: Diagnosis not present

## 2018-08-20 DIAGNOSIS — N401 Enlarged prostate with lower urinary tract symptoms: Secondary | ICD-10-CM

## 2018-08-20 DIAGNOSIS — R059 Cough, unspecified: Secondary | ICD-10-CM | POA: Insufficient documentation

## 2018-08-20 MED ORDER — GUAIFENESIN-CODEINE 100-10 MG/5ML PO SOLN: 5.0000 mL | Freq: Three times a day (TID) | ORAL | 0 refills | Status: DC | PRN

## 2018-08-20 MED ORDER — IPRATROPIUM BROMIDE 0.06 % NA SOLN: 2.0000 | Freq: Four times a day (QID) | NASAL | 0 refills | Status: DC

## 2018-08-20 NOTE — Assessment & Plan Note (Signed)
Likely some component of postnasal drip and postinfectious cough.  Start Atrovent nasal spray and also guaifenesin-codeine cough syrup.  Patient will be cleaning his CPAP machine and possibly switching to a new one which could help as well.  If symptoms do not improve over the next several weeks, would consider referral back to pulm.

## 2018-08-20 NOTE — Progress Notes (Signed)
Subjective:  Gordon Phillips is a 72 y.o. male who presents today for a TCM visit.  HPI:  Summary of Hospital admission: Reason for admission: Atrial flutter with RVR Date of admission: 08/12/2018 Date of discharge: 08/15/2018 Date of Interactive contact: 08/16/2018 Summary of Hospital course: Patient presented to the ED on 08/12/2018 with tachycardiac after being evaluated at his hematology office appointment.  In the ED, patient was found to have atrial flutter with RVR and started on a Cardizem drip.  Cardiology was consulted.  Underwent work-up including TEE and cardioversion.  He was started on oral anticoagulation with Eliquis.  Patient was transitioned to oral Cardizem and discharged home.  Interim History:   Atrial flutter Patient is currently on Eliquis 5 mg twice daily and Cardizem 120 mg daily.  He has tolerated both these medications well without side effects.  No reported chest pain or shortness of breath.  No reported palpitations.  Cough Symptoms have been persistent for the last 4 to 6 weeks.  Symptoms are worse at night.  No fevers or chills.  Was evaluated by his PCP about a month ago and diagnosed with viral bronchitis.  He tries Robitussin which helps some.  No sore throat.  No rhinorrhea.    HTN, chronic problem Currently on losartan 100 mg daily and metoprolol 6 and 800 mg daily.  Tolerating both these well.  HLD, chronic problem On pravastatin 40 mg daily tolerating well.  BPH, chronic problem On Cardura 2 mg daily.  Symptoms are manageable currently.  Anal irritation Started several weeks ago.  Has had some itchiness and soreness to the area.  No bleeding.  Has tried cortisone cream which is helped a little bit.  ROS: Per HPI, otherwise a complete review of systems was negative.   PMH:  The following were reviewed and entered/updated in epic: Past Medical History:  Diagnosis Date  . ALLERGIC RHINITIS 01/25/2010  . Arthritis    "neck, back, shoulders,"  (02/20/2017)  . Chronic lower back pain   . High cholesterol   . Hx of cardiovascular stress test    a. Lexiscan Myoview 02/06/13:  EF 59%, no ischemia or scar  . Hx of echocardiogram    a.  Echo 02/03/13:  Mild LVH, mild focal basal septal hypertrophy, EF 50-55%, Gr 1 DD, mild LAE  . HYPERTENSION 01/25/2010  . HYPERTHYROIDISM 01/25/2010   S/P radioactive iodine "back in the 1980s" (02/20/2017)  . Impaired glucose tolerance   . 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 Obesity   . OSA on CPAP    Uses CPAP nightly  . Pericarditis 1980's  . Stroke 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  Marland Kitchen Vitiligo    Patient Active Problem List   Diagnosis Date Noted  . Deviated septum 08/20/2018  . BPH (benign prostatic hyperplasia) 08/20/2018  . Cough 08/20/2018  . Anal irritation 08/20/2018  . Atrial flutter with rapid ventricular response ([place]) 08/12/2018  . HLD (hyperlipidemia) 02/20/2017  . History of CVA (cerebrovascular accident) 02/20/2017  . Back pain, chronic 11/22/2015  . Morbid obesity ([place]) 05/31/2015  . MGUS (monoclonal gammopathy of unknown significance) 01/21/2015  . OSA (obstructive sleep apnea) 03/16/2014  . Impaired glucose tolerance 05/27/2012  . Thyrotoxicosis 01/25/2010  . Essential hypertension 01/25/2010  . Allergic rhinitis 01/25/2010   Past Surgical History:  Procedure Laterality Date  . CARDIOVERSION N/A 08/14/2018   Procedure: CARDIOVERSION;  Surgeon: Candee Furbish  C, MD;  Location: [facility] ENDOSCOPY;  Service: Cardiovascular;  Laterality: N/A;  . INGUINAL HERNIA REPAIR Bilateral   . LUMBAR LAMINECTOMY    . TEE WITHOUT CARDIOVERSION N/A 08/14/2018   Procedure: TRANSESOPHAGEAL ECHOCARDIOGRAM (TEE);  Surgeon: [name], MD;  Location: [facility] ENDOSCOPY;  Service: Cardiovascular;  Laterality: N/A;  . UMBILICAL HERNIA REPAIR      Family History  Problem Relation Age of Onset  . Stroke Father        Passed away in his 51s    . Thyroid disease Sister        goiter  . Sudden death Brother        ? Drug use, no autopsy  . Throat cancer Brother   . Colon cancer Neg Hx   . Esophageal cancer Neg Hx   . Rectal cancer Neg Hx   . Stomach cancer Neg Hx     Medications- Reconciled discharge and current medications in Epic.  Current Outpatient Medications  Medication Sig Dispense Refill  . apixaban (ELIQUIS) 5 MG TABS tablet Take 1 tablet (5 mg total) by mouth 2 (two) times daily. 60 tablet 0  . Aromatic Inhalants (VICKS VAPOINHALER) INHA Place 1 puff into both nostrils as needed (for congestion or stuffiness).    Marland Kitchen aspirin (BAYER ASPIRIN) 325 MG tablet Take 325 mg by mouth 3 (three) times a week.    Skipper Cliche SALINE NASAL DROPS NA Place 2 sprays into both nostrils as needed (for congestion).    Marland Kitchen diltiazem (CARDIZEM CD) 120 MG 24 hr capsule Take 1 capsule (120 mg total) by mouth daily. 30 capsule 0  . doxazosin (CARDURA) 2 MG tablet TAKE 1 TABLET AT BEDTIME (Patient taking differently: Take 2 mg by mouth at bedtime. ) 90 tablet 3  . fluticasone (FLONASE) 50 MCG/ACT nasal spray Place 2 sprays into both nostrils as needed for allergies or rhinitis. 16 g 4  . losartan (COZAAR) 100 MG tablet Take 1 tablet (100 mg total) by mouth at bedtime.    . metoprolol succinate (TOPROL-XL) 100 MG 24 hr tablet Take 1 tablet (100 mg total) by mouth at bedtime. Take with or immediately following a meal.    . pravastatin (PRAVACHOL) 40 MG tablet Take 1 tablet (40 mg total) by mouth at bedtime.    Marland Kitchen PRESCRIPTION MEDICATION CPAP: At bedtime    . sildenafil (VIAGRA) 100 MG tablet Take 100 mg by mouth daily as needed for erectile dysfunction.     Marland Kitchen guaiFENesin-codeine 100-10 MG/5ML syrup Take 5 mLs by mouth 3 (three) times daily as needed for cough. 120 mL 0  . ipratropium (ATROVENT) 0.06 % nasal spray Place 2 sprays into both nostrils 4 (four) times daily. 15 mL 0   No current facility-administered medications for this visit.      Allergies-reviewed and updated Allergies  Allergen Reactions  . Flomax [Tamsulosin Hcl] Itching  . Relpax [Eletriptan] Other (See Comments) and Hypertension    Chest pain and sweating    Social History   Socioeconomic History  . Marital status: Married    Spouse name: Not on file  . Number of children: 2  . Years of education: Not on file  . Highest education level: Not on file  Occupational History  . Occupation: Geographical information systems officer  . Financial resource strain: Not on file  . Food insecurity:    Worry: Not on file    Inability: Not on file  . Transportation needs:    Medical: Not  on file    Non-medical: Not on file  Tobacco Use  . Smoking status: Former Smoker    Packs/day: 0.50    Years: 8.00    Pack years: 4.00    Types: Cigarettes    Last attempt to quit: 1968    Years since quitting: 51.8  . Smokeless tobacco: Never Used  Substance and Sexual Activity  . Alcohol use: Yes    Comment: 02/20/2017 "couple drinks/month on average"  . Drug use: No  . Sexual activity: Yes  Lifestyle  . Physical activity:    Days per week: Not on file    Minutes per session: Not on file  . Stress: Not on file  Relationships  . Social connections:    Talks on phone: Not on file    Gets together: Not on file    Attends religious service: Not on file    Active member of club or organization: Not on file    Attends meetings of clubs or organizations: Not on file    Relationship status: Not on file  Other Topics Concern  . Not on file  Social History Narrative  . Not on file    Objective:  Physical Exam: BP 126/84 (BP Location: Left Arm, Patient Position: Sitting, Cuff Size: Normal)   Pulse 75   Temp 98.3 F (36.8 C) (Oral)   Ht 6' (1.829 m)   Wt 271 lb (122.9 kg)   SpO2 97%   BMI 36.75 kg/m   Gen: NAD, resting comfortably HEENT: TMs clear bilaterally.  Nasal mucosa erythematous and boggy bilaterally.  Leftward nasal septal deviation noted. CV: Irregular with no  murmurs appreciated Pulm: NWOB, CTAB with no crackles, wheezes, or rhonchi GI: Normal bowel sounds present. Soft, Nontender, Nondistended. GU: Mild erythema around external anus.  Prostate palpated without abnormality. MSK: No edema, cyanosis, or clubbing noted Skin: Warm, dry Neuro: Grossly normal, moves all extremities Psych: Normal affect and thought content  Assessment/Plan:  Deviated septum Possibly the source of patient's nasal congestion.  Is not interested in surgery at this point.  BPH (benign prostatic hyperplasia) Prostate exam normal. Continue Cardura 2 mg daily.  Check PSA with next blood draw.  Anal irritation No red flag signs or symptoms.  Exam is relatively benign.  Continue topical cortisone cream.  Also recommended zinc oxide ointment.  HLD (hyperlipidemia) Stable.  Continue pravastatin 40 mg daily.  Essential hypertension At goal.  Continue losartan 100 mg daily and metoprolol succinate milligrams daily.  Cough Likely some component of postnasal drip and postinfectious cough.  Start Atrovent nasal spray and also guaifenesin-codeine cough syrup.  Patient will be cleaning his CPAP machine and possibly switching to a new one which could help as well.  If symptoms do not improve over the next several weeks, would consider referral back to pulm.  Atrial flutter with rapid ventricular response (HCC) Rate controlled today.  Continue Eliquis and diltiazem.  Follow-up with cardiology as planned.  Time Spent: I spent >60 minutes face-to-face with the patient, with more than half spent on counseling for management of his atrial flutter, cough, hypertension, hyperlipidemia, anal irritation, and BPH.   Algis Greenhouse. [name], MD 08/20/2018 4:52 PM

## 2018-08-20 NOTE — Assessment & Plan Note (Signed)
At goal.  Continue losartan 100 mg daily and metoprolol succinate milligrams daily.

## 2018-08-20 NOTE — Assessment & Plan Note (Signed)
Prostate exam normal. Continue Cardura 2 mg daily.  Check PSA with next blood draw.

## 2018-08-20 NOTE — Patient Instructions (Addendum)
It was very nice to see you today!  Please start the atrovent nasal spray. Use the cough syrup as needed. Pleas let me know if your symptoms worsen or do not improve over the next few weeks.  Your prostate exam was normal.  You can continue using the cortisone cream as needed.  No other changes today.   Come back to see me in 1 year, or sooner as needed.  Take care, Dr [name]

## 2018-08-20 NOTE — Assessment & Plan Note (Signed)
Possibly the source of patient's nasal congestion.  Is not interested in surgery at this point.

## 2018-08-20 NOTE — Assessment & Plan Note (Signed)
Rate controlled today.  Continue Eliquis and diltiazem.  Follow-up with cardiology as planned.

## 2018-08-20 NOTE — Assessment & Plan Note (Signed)
Stable.  Continue pravastatin 40 mg daily. 

## 2018-08-20 NOTE — Assessment & Plan Note (Signed)
No red flag signs or symptoms.  Exam is relatively benign.  Continue topical cortisone cream.  Also recommended zinc oxide ointment.

## 2018-08-26 ENCOUNTER — Other Ambulatory Visit: Payer: Self-pay | Admitting: Family Medicine

## 2018-08-26 NOTE — Telephone Encounter (Signed)
These medication were prescribed from a hospitalist / Patient had OV with Dr [name] on 08/20/2018.

## 2018-08-26 NOTE — Telephone Encounter (Signed)
Copied from [place] [id number]. Topic: Quick Communication - Rx Refill/Question >> Aug 26, 2018  5:04 PM Selinda Flavin B, NT wrote: **Patient states that this needs to be 90 day supplies. States that these were prescribed at the hospital and he does not see the cardiologist until December.**  Medication: diltiazem (CARDIZEM CD) 120 MG 24 hr capsule apixaban (ELIQUIS) 5 MG TABS tablet  Has the patient contacted their pharmacy? Yes.   (Agent: If no, request that the patient contact the pharmacy for the refill.) (Agent: If yes, when and what did the pharmacy advise?)  Preferred Pharmacy (with phone number or street name): [facility]  Agent: Please be advised that RX refills may take up to 3 business days. We ask that you follow-up with your pharmacy.

## 2018-08-27 MED ORDER — DILTIAZEM HCL ER COATED BEADS 120 MG PO CP24: 120.0000 mg | ORAL_CAPSULE | Freq: Every day | ORAL | 0 refills | Status: DC

## 2018-08-27 MED ORDER — APIXABAN 5 MG PO TABS: 5.0000 mg | ORAL_TABLET | Freq: Two times a day (BID) | ORAL | 0 refills | Status: DC

## 2018-08-27 NOTE — Telephone Encounter (Signed)
See note

## 2018-08-27 NOTE — Telephone Encounter (Signed)
Requested medication (s) are due for refill today: yes  Requested medication (s) are on the active medication list: yes  Last refill:  Last refilled by hospitalist  Future visit scheduled: yes  Notes to clinic:  LOV on 08/20/18 with Dr. [name]. Pt requesting a 90 day supply of the medication and states he does not see the cardiologist until December.    Requested Prescriptions  Pending Prescriptions Disp Refills   diltiazem (CARDIZEM CD) 120 MG 24 hr capsule 30 capsule 0    Sig: Take 1 capsule (120 mg total) by mouth daily.     Off-Protocol Failed - 08/26/2018  5:16 PM      Failed - Medication not assigned to a protocol, review manually.      Passed - Valid encounter within last 12 months    Recent Outpatient Visits          1 week ago Cough   [facility], Algis Greenhouse, MD   1 month ago Viral URI with cough   [place] [facility], Doretha Sou, MD   4 months ago Essential hypertension   [facility], Doretha Sou, MD   11 months ago MGUS (monoclonal gammopathy of unknown significance)   [facility], Doretha Sou, MD   1 year ago Pain of toe of right foot   [facility], [place], DO      Future Appointments            In 12 months Vivi Barrack, MD [place], [facility]          apixaban (ELIQUIS) 5 MG TABS tablet 60 tablet 0    Sig: Take 1 tablet (5 mg total) by mouth 2 (two) times daily.     Hematology:  Anticoagulants Failed - 08/26/2018  5:16 PM      Failed - HGB in normal range and within 360 days    Hemoglobin  Date Value Ref Range Status  08/14/2018 12.5 (L) 13.0 - 17.0 g/dL Final  08/12/2018 12.9 (L) 13.0 - 17.1 g/dL Final   HGB  Date Value Ref Range Status  08/13/2017 13.6 13.0 - 17.1 g/dL Final         Passed - PLT in normal range and within 360 days    Platelets  Date Value Ref Range Status  08/14/2018  155 150 - 400 K/uL Final  08/13/2017 138 (L) 140 - 400 10e3/uL Final   Platelet Count  Date Value Ref Range Status  08/12/2018 149 140 - 400 K/uL Final         Passed - HCT in normal range and within 360 days    HCT  Date Value Ref Range Status  08/14/2018 39.7 39.0 - 52.0 % Final  08/13/2017 40.8 38.4 - 49.9 % Final         Passed - Cr in normal range and within 360 days    Creatinine  Date Value Ref Range Status  08/12/2018 1.03 0.61 - 1.24 mg/dL Final  08/13/2017 1.3 0.7 - 1.3 mg/dL Final   Creatinine, Ser  Date Value Ref Range Status  08/15/2018 1.12 0.61 - 1.24 mg/dL Final         Passed - Valid encounter within last 12 months    Recent Outpatient Visits          1 week ago Cough   [facility], Algis Greenhouse, MD   1  month ago Viral URI with cough   [facility], Doretha Sou, MD   4 months ago Essential hypertension   [facility], Doretha Sou, MD   11 months ago MGUS (monoclonal gammopathy of unknown significance)   [facility], Doretha Sou, MD   1 year ago Pain of toe of right foot   [facility] at [facility], Nickola Major, DO      Future Appointments            In 12 months [name], Algis Greenhouse, MD [place], [facility]

## 2018-09-03 DIAGNOSIS — M47816 Spondylosis without myelopathy or radiculopathy, lumbar region: Secondary | ICD-10-CM | POA: Diagnosis not present

## 2018-09-06 ENCOUNTER — Other Ambulatory Visit: Payer: Self-pay | Admitting: Family Medicine

## 2018-09-06 ENCOUNTER — Ambulatory Visit: Payer: Medicare Other | Admitting: Nurse Practitioner

## 2018-09-06 NOTE — Telephone Encounter (Signed)
See note

## 2018-09-06 NOTE — Telephone Encounter (Signed)
Copied from [place] [id number]. Topic: General - Other >> Sep 06, 2018  2:49 PM Janace Aris A wrote: Medication: guaiFENesin-codeine 100-10 MG/5ML syrup   Has the patient contacted their pharmacy? Yes   Preferred Pharmacy (with phone number or street name): [facility] [place], [address] AT [address]  [phone] (Phone) [phone] (Fax)    Agent: Please be advised that RX refills may take up to 3 business days. We ask that you follow-up with your pharmacy.

## 2018-09-06 NOTE — Telephone Encounter (Signed)
Requested medication (s) are due for refill today: Yes  Requested medication (s) are on the active medication list: Yes  Last refill: 08/20/18  Future visit scheduled: No  Notes to clinic:  Unable to refill per protocol     Requested Prescriptions  Pending Prescriptions Disp Refills   guaiFENesin-codeine 100-10 MG/5ML syrup 120 mL 0    Sig: Take 5 mLs by mouth 3 (three) times daily as needed for cough.     Off-Protocol Failed - 09/06/2018  2:57 PM      Failed - Medication not assigned to a protocol, review manually.      Passed - Valid encounter within last 12 months    Recent Outpatient Visits          2 weeks ago Cough   [facility], Algis Greenhouse, MD   1 month ago Viral URI with cough   [facility], Doretha Sou, MD   4 months ago Essential hypertension   [facility], Doretha Sou, MD   11 months ago MGUS (monoclonal gammopathy of unknown significance)   [facility], Doretha Sou, MD   1 year ago Pain of toe of right foot   [facility], Nickola Major, DO      Future Appointments            In 14 months [name], Algis Greenhouse, MD [place], [facility]

## 2018-09-12 MED ORDER — GUAIFENESIN-CODEINE 100-10 MG/5ML PO SOLN: 5.0000 mL | Freq: Three times a day (TID) | ORAL | 0 refills | Status: DC | PRN

## 2018-09-12 NOTE — Telephone Encounter (Signed)
Rx filled.  Needs OV for further refills.

## 2018-09-12 NOTE — Telephone Encounter (Signed)
Please advise 

## 2018-09-17 ENCOUNTER — Encounter: Payer: Self-pay | Admitting: Nurse Practitioner

## 2018-09-17 ENCOUNTER — Ambulatory Visit: Payer: Medicare Other | Admitting: Nurse Practitioner

## 2018-09-17 VITALS — BP 138/88 | HR 82 | Ht 72.0 in | Wt 275.0 lb

## 2018-09-17 DIAGNOSIS — Z9989 Dependence on other enabling machines and devices: Secondary | ICD-10-CM

## 2018-09-17 DIAGNOSIS — E669 Obesity, unspecified: Secondary | ICD-10-CM

## 2018-09-17 DIAGNOSIS — I483 Typical atrial flutter: Secondary | ICD-10-CM

## 2018-09-17 DIAGNOSIS — I10 Essential (primary) hypertension: Secondary | ICD-10-CM | POA: Diagnosis not present

## 2018-09-17 DIAGNOSIS — G4733 Obstructive sleep apnea (adult) (pediatric): Secondary | ICD-10-CM

## 2018-09-17 DIAGNOSIS — I4819 Other persistent atrial fibrillation: Secondary | ICD-10-CM | POA: Diagnosis not present

## 2018-09-17 NOTE — Patient Instructions (Addendum)
Medication Instructions:   TIKOSYN IS THE MEDICATION THAT WAS DISCUSSED IN YOUR VISIT TODAY   If you need a refill on your cardiac medications before your next appointment, please call your pharmacy.   Lab work: NONE ORDERED  TODAY   If you have labs (blood work) drawn today and your tests are completely normal, you will receive your results only by: Marland Kitchen MyChart Message (if you have MyChart) OR . A paper copy in the mail If you have any lab test that is abnormal or we need to change your treatment, we will call you to review the results.  Testing/Procedures:NONE ORDERED  TODAY   Follow-Up:  At [facility], you and your health needs are our priority.  As part of our continuing mission to provide you with exceptional heart care, we have created designated Provider Care Teams.  These Care Teams include your primary Cardiologist (physician) and Advanced Practice Providers (APPs -  Physician Assistants and Nurse Practitioners) who all work together to provide you with the care you need, when you need it. You need to  be seen in 4 to 6 weeks  With DR Caryl Comes. OR SEILER OR URSUY     PLEASE CONTACT AFIB CLINIC FOR FURTHER INFORMATION ABOUT WEIGHT MANAGEMENT PROGRAM [phone]  Any Other Special Instructions Will Be Listed Below (If Applicable).

## 2018-09-17 NOTE — Progress Notes (Signed)
Electrophysiology Office Note Date: 09/17/2018  ID:  Gordon Phillips, DOB [birth year], MRN [id number]  PCP: Gordon Barrack, MD Primary Cardiologist: Gordon Phillips Electrophysiologist: Gordon Phillips  CC: hospital follow up/discuss ablation  Gordon Phillips is a 72 y.o. male seen today for Dr Gordon Phillips.  He presents today for routine post hosptial electrophysiology followup.  He was recently admitted and found to be in newly diagnosed atrial flutter. He was discharged to home with planned follow up today to discuss ablation. Since discharge, the patient reports doing relatively well.  His biggest concern is with back Phillips that leads to leg weakness.  He denies chest Phillips, palpitations, dyspnea, PND, orthopnea, nausea, vomiting, dizziness, syncope, edema, weight gain, or early satiety.  Past Medical History:  Diagnosis Date  . ALLERGIC RHINITIS 01/25/2010  . Arthritis    "neck, back, shoulders," (02/20/2017)  . Chronic lower back Phillips   . High cholesterol   . Hx of cardiovascular stress test    a. Lexiscan Myoview 02/06/13:  EF 59%, no ischemia or scar  . Hx of echocardiogram    a.  Echo 02/03/13:  Mild LVH, mild focal basal septal hypertrophy, EF 50-55%, Gr 1 DD, mild LAE  . HYPERTENSION 01/25/2010  . HYPERTHYROIDISM 01/25/2010   S/P radioactive iodine "back in the 1980s" (02/20/2017)  . Impaired glucose tolerance   . 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 Obesity   . OSA on CPAP    Uses CPAP nightly  . Pericarditis 1980's  . 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  Marland Kitchen Vitiligo    Past Surgical History:  Procedure Laterality Date  . CARDIOVERSION N/A 08/14/2018   Procedure: CARDIOVERSION;  Surgeon: Gordon Pain, MD;  Location: [facility] ENDOSCOPY;  Service: Cardiovascular;  Laterality: N/A;  . INGUINAL HERNIA REPAIR Bilateral   . LUMBAR LAMINECTOMY    . TEE WITHOUT CARDIOVERSION N/A 08/14/2018   Procedure: TRANSESOPHAGEAL ECHOCARDIOGRAM (TEE);   Surgeon: Gordon Pain, MD;  Location: [facility] ENDOSCOPY;  Service: Cardiovascular;  Laterality: N/A;  . UMBILICAL HERNIA REPAIR      Current Outpatient Medications  Medication Sig Dispense Refill  . apixaban (ELIQUIS) 5 MG TABS tablet Take 1 tablet (5 mg total) by mouth 2 (two) times daily. 60 tablet 0  . Aromatic Inhalants (VICKS VAPOINHALER) INHA Place 1 puff into both nostrils as needed (for congestion or stuffiness).    Marland Kitchen aspirin (BAYER ASPIRIN) 325 MG tablet Take 325 mg by mouth 3 (three) times a week.    Skipper Cliche SALINE NASAL DROPS NA Place 2 sprays into both nostrils as needed (for congestion).    Marland Kitchen diltiazem (CARDIZEM CD) 120 MG 24 hr capsule Take 1 capsule (120 mg total) by mouth daily. 30 capsule 0  . doxazosin (CARDURA) 2 MG tablet TAKE 1 TABLET AT BEDTIME 90 tablet 3  . fluticasone (FLONASE) 50 MCG/ACT nasal spray Place 2 sprays into both nostrils as needed for allergies or rhinitis. 16 g 4  . guaiFENesin-codeine 100-10 MG/5ML syrup Take 5 mLs by mouth 3 (three) times daily as needed for cough. 120 mL 0  . ipratropium (ATROVENT) 0.06 % nasal spray Place 2 sprays into both nostrils 4 (four) times daily. 15 mL 0  . losartan (COZAAR) 100 MG tablet Take 1 tablet (100 mg total) by mouth at bedtime.    . metoprolol succinate (TOPROL-XL) 100 MG 24 hr tablet Take 1 tablet (100 mg total) by mouth  at bedtime. Take with or immediately following a meal.    . pravastatin (PRAVACHOL) 40 MG tablet Take 1 tablet (40 mg total) by mouth at bedtime.    Marland Kitchen PRESCRIPTION MEDICATION CPAP: At bedtime    . sildenafil (VIAGRA) 100 MG tablet Take 100 mg by mouth daily as needed for erectile dysfunction.      No current facility-administered medications for this visit.     Allergies:   Flomax [tamsulosin hcl] and Relpax [eletriptan]   Social History: Social History   Socioeconomic History  . Marital status: Married    Spouse name: Not on file  . Number of children: 2  . Years of education: Not on file  .  Highest education level: Not on file  Occupational History  . Occupation: Geographical information systems officer  . Financial resource strain: Not on file  . Food insecurity:    Worry: Not on file    Inability: Not on file  . Transportation needs:    Medical: Not on file    Non-medical: Not on file  Tobacco Use  . Smoking status: Former Smoker    Packs/day: 0.50    Years: 8.00    Pack years: 4.00    Types: Cigarettes    Last attempt to quit: 1968    Years since quitting: 51.8  . Smokeless tobacco: Never Used  Substance and Sexual Activity  . Alcohol use: Yes    Comment: 02/20/2017 "couple drinks/month on average"  . Drug use: No  . Sexual activity: Yes  Lifestyle  . Physical activity:    Days per week: Not on file    Minutes per session: Not on file  . Stress: Not on file  Relationships  . Social connections:    Talks on phone: Not on file    Gets together: Not on file    Attends religious service: Not on file    Active member of club or organization: Not on file    Attends meetings of clubs or organizations: Not on file    Relationship status: Not on file  . Intimate partner violence:    Fear of current or ex partner: Not on file    Emotionally abused: Not on file    Physically abused: Not on file    Forced sexual activity: Not on file  Other Topics Concern  . Not on file  Social History Narrative  . Not on file    Family History: Family History  Problem Relation Age of Onset  . Stroke Father        Passed away in his 79s  . Thyroid disease Sister        goiter  . Sudden death Brother        ? Drug use, no autopsy  . Throat cancer Brother   . Colon cancer Neg Hx   . Esophageal cancer Neg Hx   . Rectal cancer Neg Hx   . Stomach cancer Neg Hx     Review of Systems: All other systems reviewed and are otherwise negative except as noted above.   Physical Exam: VS:  BP 138/88   Pulse 82   Ht 6' (1.829 m)   Wt 275 lb (124.7 kg)   SpO2 96%   BMI 37.30 kg/m  , BMI Body  mass index is 37.3 kg/m. Wt Readings from Last 3 Encounters:  09/17/18 275 lb (124.7 kg)  08/20/18 271 lb (122.9 kg)  08/15/18 270 lb 11.2 oz (122.8 kg)  GEN- The patient is well appearing, alert and oriented x 3 today.   HEENT: normocephalic, atraumatic; sclera clear, conjunctiva pink; hearing intact; oropharynx clear; neck supple  Lungs- Clear to ausculation bilaterally, normal work of breathing.  No wheezes, rales, rhonchi Heart- Irregular rate and rhythm  GI- soft, non-tender, non-distended, bowel sounds present  Extremities- no clubbing, cyanosis, or edema  MS- no significant deformity or atrophy Skin- warm and dry, no rash or lesion  Psych- euthymic mood, full affect Neuro- strength and sensation are intact   EKG:  EKG is ordered today. The ekg ordered today shows atrial fibrillation, rate 82  Recent Labs: 08/12/2018: ALT 20; TSH 1.442 08/14/2018: Hemoglobin 12.5; Platelets 155 08/15/2018: BUN 17; Creatinine, Ser 1.12; Magnesium 2.1; Potassium 4.3; Sodium 139    Other studies Reviewed: Additional studies/ records that were reviewed today include: hospital records  Assessment and Plan:  1.  Typical atrial flutter/persistent atrial fibrillation Initially plan was for flutter ablation, unfortunately, he is in rate controlled atrial fibrillation today Continue Eliquis for CHADS2VASC of 5 He is relatively asymptomatic. We discussed AF at length today including pathophysiology, treatment options, and lifestyle modification. He is compliant with CPAP and BP meds.  He is going to work on weight loss and exercise.  With 1st degree AV block at baseline and LA size, I think that Tikosyn is our only real AAD option to start with. For now, he will work on lifestyle modification and think about comparing symptoms to earlier this year. I have given him nutrition flyer as well as AF clinic information.  Follow up with Dr Gordon Phillips in 4 weeks. If symptomatic at that time, can consider Tikosyn  admission.   2.  HTN Stable No change required today  3.  OSA Compliant with CPAP  4.  Obesity Body mass index is 37.3 kg/m. See above    Current medicines are reviewed at length with the patient today.   The patient does not have concerns regarding his medicines.  The following changes were made today:  none  Labs/ tests ordered today include:  Orders Placed This Encounter  Procedures  . EKG 12-Lead     Disposition:   Follow up with Dr Gordon Phillips 4 weeks  Signed, Chanetta Marshall, NP 09/17/2018 1:34 PM   [name] [address] [phone] (office) [phone] (fax)

## 2018-09-24 ENCOUNTER — Other Ambulatory Visit: Payer: Self-pay | Admitting: Internal Medicine

## 2018-09-24 MED ORDER — DILTIAZEM HCL ER COATED BEADS 120 MG PO CP24: 120.0000 mg | ORAL_CAPSULE | Freq: Every day | ORAL | 3 refills | Status: DC

## 2018-09-24 MED ORDER — DILTIAZEM HCL ER COATED BEADS 120 MG PO CP24: 120.0000 mg | ORAL_CAPSULE | Freq: Every day | ORAL | 0 refills | Status: DC

## 2018-09-24 NOTE — Telephone Encounter (Signed)
Pt's medication was sent to pt's pharmacy as requested. Confirmation received.  °

## 2018-09-25 ENCOUNTER — Telehealth: Payer: Self-pay | Admitting: Hematology

## 2018-09-25 NOTE — Telephone Encounter (Signed)
Tried to reach patient regarding voicemail and someone  hangs up so I cant leave a message

## 2018-09-26 ENCOUNTER — Telehealth: Payer: Self-pay | Admitting: Internal Medicine

## 2018-09-26 NOTE — Telephone Encounter (Signed)
Called per staff message to move appt from [place] to [place]. Wife states pt was having extreme anxiety last night, and was going to call us to see what he should do but she fell asleep. Wife Diane requesting nurse call to discuss, d/t it being related to his heart issues. [phone]

## 2018-09-27 NOTE — Telephone Encounter (Signed)
Spoke with pt's wife who states her husband has been having more anxiety than usual. He has had difficulty in sleeping. Pt's wife denies pt is back in Afib or flutter. She states his HR is normal most of the time but his heart goes in and out of rhythm. Pt was not at home to discuss symptoms.  I advised pt's wife to visit the ED if pt's heart goes out of rhythm and is > 130bmp. Pt may call the office to be worked in if he is sustained out of rhythm < 130bmp as well. I have moved pt's follow up appt to discuss [place] admission for Dec 18 (Dr Olin Pia first available.)  Pt's wife will contact pts PCP regarding anxiety since his anxiety does not correlate with his heart rhythm and rate.

## 2018-10-01 ENCOUNTER — Telehealth: Payer: Self-pay | Admitting: Hematology

## 2018-10-01 NOTE — Telephone Encounter (Signed)
tried to reach regarding voicemail he was not at home

## 2018-10-01 NOTE — Telephone Encounter (Signed)
Patient called to reschedule  °

## 2018-10-07 ENCOUNTER — Other Ambulatory Visit: Payer: Self-pay | Admitting: Cardiology

## 2018-10-07 MED ORDER — APIXABAN 5 MG PO TABS: 5.0000 mg | ORAL_TABLET | Freq: Two times a day (BID) | ORAL | 3 refills | Status: DC

## 2018-10-07 MED ORDER — APIXABAN 5 MG PO TABS: 5.0000 mg | ORAL_TABLET | Freq: Two times a day (BID) | ORAL | 0 refills | Status: DC

## 2018-10-07 NOTE — Telephone Encounter (Signed)
°*  STAT* If patient is at the pharmacy, call can be transferred to refill team.   1. Which medications need to be refilled? (please list name of each medication and dose if known)   apixaban (ELIQUIS) 5 MG TABS tablet   2. Which pharmacy/location (including street and city if local pharmacy) is medication to be sent to?  [place], [place], [address] AT [address]  3. Do they need a 30 day or 90 day supply? 90 days   Patient only has a 5 days supply, please send a script to [facility] to hold him over till the [facility] supply comes in.

## 2018-10-07 NOTE — Addendum Note (Signed)
Addended by: Waylan Rocher on: 10/07/2018 02:16 PM   Modules accepted: Orders

## 2018-10-09 DIAGNOSIS — G4733 Obstructive sleep apnea (adult) (pediatric): Secondary | ICD-10-CM | POA: Diagnosis not present

## 2018-10-14 ENCOUNTER — Ambulatory Visit: Payer: Medicare Other | Admitting: Cardiology

## 2018-10-14 ENCOUNTER — Encounter: Payer: Self-pay | Admitting: Cardiology

## 2018-10-14 VITALS — BP 132/74 | HR 66 | Ht 74.0 in | Wt 274.6 lb

## 2018-10-14 DIAGNOSIS — I10 Essential (primary) hypertension: Secondary | ICD-10-CM

## 2018-10-14 DIAGNOSIS — Z7189 Other specified counseling: Secondary | ICD-10-CM

## 2018-10-14 DIAGNOSIS — I4892 Unspecified atrial flutter: Secondary | ICD-10-CM

## 2018-10-14 DIAGNOSIS — I480 Paroxysmal atrial fibrillation: Secondary | ICD-10-CM

## 2018-10-14 DIAGNOSIS — G4733 Obstructive sleep apnea (adult) (pediatric): Secondary | ICD-10-CM | POA: Diagnosis not present

## 2018-10-14 DIAGNOSIS — Z79899 Other long term (current) drug therapy: Secondary | ICD-10-CM

## 2018-10-14 NOTE — Patient Instructions (Addendum)
-we are aiming for a heart rate of less than 90 at rest and less than 110 with mild exertion. If you use a pulse oximeter or blood pressure cuff, sit quietly in a chair for 10 minutes before you take your pulse. If your pulse remains over 110 at rest, let us know and we will adjust medications. -if you feel dizzy or lightheaded, check your pulse and make sure it isn't too slow (less than 50).   Dofetilide capsules What is this medicine? DOFETILIDE (doe FET il ide) is an antiarrhythmic drug. It helps make your heart beat regularly. This medicine also helps to slow rapid heartbeats. This medicine may be used for other purposes; ask your health care provider or pharmacist if you have questions. COMMON BRAND NAME(S): Tikosyn What should I tell my health care provider before I take this medicine? They need to know if you have any of these conditions: -heart disease -history of low levels of potassium or magnesium -kidney disease -liver disease -an unusual or allergic reaction to dofetilide, other medicines, foods, dyes, or preservatives -pregnant or trying to get pregnant -breast-feeding How should I use this medicine? Take this medicine by mouth with a glass of water. Follow the directions on the prescription label. You can take this medicine with or without food. Do not drink grapefruit juice with this medicine. Take your doses at regular intervals. Do not take your medicine more often than directed. Do not stop taking this medicine suddenly. This may cause serious, heart-related side effects. Your doctor will tell you how much medicine to take. If your doctor wants you to stop the medicine, the dose will be slowly lowered over time to avoid any side effects. Talk to your pediatrician regarding the use of this medicine in children. Special care may be needed. Overdosage: If you think you have taken too much of this medicine contact a poison control center or emergency room at once. NOTE: This  medicine is only for you. Do not share this medicine with others. What if I miss a dose? If you miss a dose, take it as soon as you can. If it is almost time for your next dose, take only that dose. Do not take double or extra doses. What may interact with this medicine? Do not take this medicine with any of the following medications: -cimetidine -dolutegravir -hydrochlorothiazide alone or in combination with other medicines -isavuconazonium -ketoconazole -megestrol -other medicines that prolong the QT interval (cause an abnormal heart rhythm) -prochlorperazine -trimethoprim alone or in combination with sulfamethoxazole -verapamil This medicine may also interact with the following medications: -amiloride -certain antidepressants like fluvoxamine or paroxetine -certain antiviral medicines for HIV or AIDS like atazanavir or darunavir -certain medicines for fungal infections like clotrimazole or miconazole -digoxin -diltiazem -dronabinol, THC -grapefruit juice -metformin -nefazodone -triamterene -zafirlukast This list may not describe all possible interactions. Give your health care provider a list of all the medicines, herbs, non-prescription drugs, or dietary supplements you use. Also tell them if you smoke, drink alcohol, or use illegal drugs. Some items may interact with your medicine. What should I watch for while using this medicine? Visit your doctor or health care professional for regular checks on your progress. Wear a medical ID bracelet or chain, and carry a card that describes your disease and details of your medicine and dosage times. Check your heart rate and blood pressure regularly while you are taking this medicine. Ask your doctor or health care professional what your heart rate and blood pressure  should be, and when you should contact him or her. Your doctor or health care professional also may schedule regular tests to check your progress. You will be started on this  medicine in a specialized facility for at least three days. You will be monitored to find the right dose of medicine for you. It is very important that you take your medicine exactly as prescribed when you leave the hospital. The correct dosing of this medicine is very important to treat your condition and prevent possible serious side effects. What side effects may I notice from receiving this medicine? Side effects that you should report to your doctor or health care professional as soon as possible: -allergic reactions like skin rash, itching or hives, swelling of the face, lips, or tongue -breathing problems -dizziness -fast or rapid beating of the heart -feeling faint or lightheaded -swelling of the ankles -unusually weak or tired -vomiting Side effects that usually do not require medical attention (report to your doctor or health care professional if they continue or are bothersome): -cough -diarrhea -difficulty sleeping -headache -nausea -stomach pain This list may not describe all possible side effects. Call your doctor for medical advice about side effects. You may report side effects to FDA at 1-800-FDA-1088. Where should I keep my medicine? Keep out of the reach of children. Store at room temperature between 15 and 30 degrees C (59 and 86 degrees F). Protect the medicine from moisture or humidity. Keep container tightly closed. Throw away any unused medicine after the expiration date. NOTE: This sheet is a summary. It may not cover all possible information. If you have questions about this medicine, talk to your doctor, pharmacist, or health care provider.  2018 Elsevier/Gold Standard (2016-09-04 81:01:75) Atrial Fibrillation Atrial fibrillation is a type of irregular or rapid heartbeat (arrhythmia). In atrial fibrillation, the heart quivers continuously in a chaotic pattern. This occurs when parts of the heart receive disorganized signals that make the heart unable to pump blood  normally. This can increase the risk for stroke, heart failure, and other heart-related conditions. There are different types of atrial fibrillation, including:  Paroxysmal atrial fibrillation. This type starts suddenly, and it usually stops on its own shortly after it starts.  Persistent atrial fibrillation. This type often lasts longer than a week. It may stop on its own or with treatment.  Long-lasting persistent atrial fibrillation. This type lasts longer than 12 months.  Permanent atrial fibrillation. This type does not go away.  Talk with your health care provider to learn about the type of atrial fibrillation that you have. What are the causes? This condition is caused by some heart-related conditions or procedures, including:  A heart attack.  Coronary artery disease.  Heart failure.  Heart valve conditions.  High blood pressure.  Inflammation of the sac that surrounds the heart (pericarditis).  Heart surgery.  Certain heart rhythm disorders, such as Wolf-Parkinson-White syndrome.  Other causes include:  Pneumonia.  Obstructive sleep apnea.  Blockage of an artery in the lungs (pulmonary embolism, or PE).  Lung cancer.  Chronic lung disease.  Thyroid problems, especially if the thyroid is overactive (hyperthyroidism).  Caffeine.  Excessive alcohol use or illegal drug use.  Use of some medicines, including certain decongestants and diet pills.  Sometimes, the cause cannot be found. What increases the risk? This condition is more likely to develop in:  People who are older in age.  People who smoke.  People who have diabetes mellitus.  People who are overweight (obese).  Athletes who exercise vigorously.  What are the signs or symptoms? Symptoms of this condition include:  A feeling that your heart is beating rapidly or irregularly.  A feeling of discomfort or pain in your chest.  Shortness of breath.  Sudden light-headedness or  weakness.  Getting tired easily during exercise.  In some cases, there are no symptoms. How is this diagnosed? Your health care provider may be able to detect atrial fibrillation when taking your pulse. If detected, this condition may be diagnosed with:  An electrocardiogram (ECG).  A Holter monitor test that records your heartbeat patterns over a 24-hour period.  Transthoracic echocardiogram (TTE) to evaluate how blood flows through your heart.  Transesophageal echocardiogram (TEE) to view more detailed images of your heart.  A stress test.  Imaging tests, such as a CT scan or chest X-ray.  Blood tests.  How is this treated? The main goals of treatment are to prevent blood clots from forming and to keep your heart beating at a normal rate and rhythm. The type of treatment that you receive depends on many factors, such as your underlying medical conditions and how you feel when you are experiencing atrial fibrillation. This condition may be treated with:  Medicine to slow down the heart rate, bring the heart's rhythm back to normal, or prevent clots from forming.  Electrical cardioversion. This is a procedure that resets your heart's rhythm by delivering a controlled, low-energy shock to the heart through your skin.  Different types of ablation, such as catheter ablation, catheter ablation with pacemaker, or surgical ablation. These procedures destroy the heart tissues that send abnormal signals. When the pacemaker is used, it is placed under your skin to help your heart beat in a regular rhythm.  Follow these instructions at home:  Take over-the counter and prescription medicines only as told by your health care provider.  If your health care provider prescribed a blood-thinning medicine (anticoagulant), take it exactly as told. Taking too much blood-thinning medicine can cause bleeding. If you do not take enough blood-thinning medicine, you will not have the protection that you  need against stroke and other problems.  Do not use tobacco products, including cigarettes, chewing tobacco, and e-cigarettes. If you need help quitting, ask your health care provider.  If you have obstructive sleep apnea, manage your condition as told by your health care provider.  Do not drink alcohol.  Do not drink beverages that contain caffeine, such as coffee, soda, and tea.  Maintain a healthy weight. Do not use diet pills unless your health care provider approves. Diet pills may make heart problems worse.  Follow diet instructions as told by your health care provider.  Exercise regularly as told by your health care provider.  Keep all follow-up visits as told by your health care provider. This is important. How is this prevented?  Avoid drinking beverages that contain caffeine or alcohol.  Avoid certain medicines, especially medicines that are used for breathing problems.  Avoid certain herbs and herbal medicines, such as those that contain ephedra or ginseng.  Do not use illegal drugs, such as cocaine and amphetamines.  Do not smoke.  Manage your high blood pressure. Contact a health care provider if:  You notice a change in the rate, rhythm, or strength of your heartbeat.  You are taking an anticoagulant and you notice increased bruising.  You tire more easily when you exercise or exert yourself. Get help right away if:  You have chest pain, abdominal pain,  sweating, or weakness.  You feel nauseous.  You notice blood in your vomit, bowel movement, or urine.  You have shortness of breath.  You suddenly have swollen feet and ankles.  You feel dizzy.  You have sudden weakness or numbness of the face, arm, or leg, especially on one side of the body.  You have trouble speaking, trouble understanding, or both (aphasia).  Your face or your eyelid droops on one side. These symptoms may represent a serious problem that is an emergency. Do not wait to see if  the symptoms will go away. Get medical help right away. Call your local emergency services (911 in the [place] Do not drive yourself to the hospital. This information is not intended to replace advice given to you by your health care provider. Make sure you discuss any questions you have with your health care provider. Document Released: 10/23/2005 Document Revised: 03/01/2016 Document Reviewed: 02/17/2015 Elsevier Interactive Patient Education  2018 [employer].     Medication Instructions:  Continue same medications If you need a refill on your cardiac medications before your next appointment, please call your pharmacy.   Lab work: None ordered   Testing/Procedures: None ordered  Follow-Up: At [facility], you and your health needs are our priority.  As part of our continuing mission to provide you with exceptional heart care, we have created designated Provider Care Teams.  These Care Teams include your primary Cardiologist (physician) and Advanced Practice Providers (APPs -  Physician Assistants and Nurse Practitioners) who all work together to provide you with the care you need, when you need it. . Follow Up with Dr.Nhu Glasby in 6 months  Call 2 months before to schedule

## 2018-10-14 NOTE — Progress Notes (Signed)
Cardiology Office Note:    Date:  10/14/2018   ID:  Gordon Phillips, DOB 04-22-1946, MRN [id number]  PCP:  Vivi Barrack, MD  Cardiologist:  Buford Dresser, MD PhD EP: Dr. Caryl Comes  Referring MD: Marletta Lor, MD   CC: follow up  History of Present Illness:    Gordon Phillips is a 72 y.o. male with a hx of atrial flutter, MGUS, back pain who is seen for follow up of his atrial fluter. I met him in the hospital 08/12/18 when he presented with new onset atrial flutter (initially called as a CODE STEMI). During his hospitalization, he underwent TEE/CV with conversion to NSR. He was seen for EP follow up post hospitalization to discuss flutter ablation on 09/17/18, but he was found to be in rate controlled atrial fibrillation at the time. Recommendation given his left atrial size was for dofetilide load.  Today: he has noticed triggers for his arrhythmia. Sugar/sweets trigger it, so he has been avoiding. Has cut out all alcohol for the last 60 days. Wonders if he has IBS as well, has been having severe gas pain radiating to his back, and he already has chronic back pain that he deals with. He was trying to eats more vegetables (kale/edamame) and had this pain for almost a week. He is better after having bowel movements yesterday morning.   We spent extensive time today discussing his treatment options. He read about dofetilide online and is very nervous about it. We discussed risk/benefit, monitoring, and alternatives. He has done significant research and has many questions, all answered to the best of my ability.  Past Medical History:  Diagnosis Date  . ALLERGIC RHINITIS 01/25/2010  . Arthritis    "neck, back, shoulders," (02/20/2017)  . Chronic lower back pain   . High cholesterol   . Hx of cardiovascular stress test    a. Lexiscan Myoview 02/06/13:  EF 59%, no ischemia or scar  . Hx of echocardiogram    a.  Echo 02/03/13:  Mild LVH, mild focal basal septal hypertrophy, EF 50-55%, Gr  1 DD, mild LAE  . HYPERTENSION 01/25/2010  . HYPERTHYROIDISM 01/25/2010   S/P radioactive iodine "back in the 1980s" (02/20/2017)  . Impaired glucose tolerance   . 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 Obesity   . OSA on CPAP    Uses CPAP nightly  . Pericarditis 1980's  . 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  Marland Kitchen Vitiligo     Past Surgical History:  Procedure Laterality Date  . CARDIOVERSION N/A 08/14/2018   Procedure: CARDIOVERSION;  Surgeon: [name], MD;  Location: [facility] ENDOSCOPY;  Service: Cardiovascular;  Laterality: N/A;  . INGUINAL HERNIA REPAIR Bilateral   . LUMBAR LAMINECTOMY    . TEE WITHOUT CARDIOVERSION N/A 08/14/2018   Procedure: TRANSESOPHAGEAL ECHOCARDIOGRAM (TEE);  Surgeon: [name], MD;  Location: [facility] ENDOSCOPY;  Service: Cardiovascular;  Laterality: N/A;  . UMBILICAL HERNIA REPAIR      Current Medications: Current Outpatient Medications on File Prior to Visit  Medication Sig  . apixaban (ELIQUIS) 5 MG TABS tablet Take 1 tablet (5 mg total) by mouth 2 (two) times daily.  . Aromatic Inhalants (VICKS VAPOINHALER) INHA Place 1 puff into both nostrils as needed (for congestion or stuffiness).  Marland Kitchen aspirin (BAYER ASPIRIN) 325 MG tablet Take 325 mg by mouth 3 (three) times a week.  Skipper Cliche SALINE NASAL DROPS NA  Place 2 sprays into both nostrils as needed (for congestion).  Marland Kitchen diltiazem (CARDIZEM CD) 120 MG 24 hr capsule Take 1 capsule (120 mg total) by mouth daily.  Marland Kitchen doxazosin (CARDURA) 2 MG tablet TAKE 1 TABLET AT BEDTIME  . fluticasone (FLONASE) 50 MCG/ACT nasal spray Place 2 sprays into both nostrils as needed for allergies or rhinitis.  Marland Kitchen guaiFENesin-codeine 100-10 MG/5ML syrup Take 5 mLs by mouth 3 (three) times daily as needed for cough.  Marland Kitchen ipratropium (ATROVENT) 0.06 % nasal spray Place 2 sprays into both nostrils 4 (four) times daily.  Marland Kitchen losartan (COZAAR) 100 MG tablet Take 1 tablet (100 mg total)  by mouth at bedtime.  . metoprolol succinate (TOPROL-XL) 100 MG 24 hr tablet Take 1 tablet (100 mg total) by mouth at bedtime. Take with or immediately following a meal.  . pravastatin (PRAVACHOL) 40 MG tablet Take 1 tablet (40 mg total) by mouth at bedtime.  Marland Kitchen PRESCRIPTION MEDICATION CPAP: At bedtime  . sildenafil (VIAGRA) 100 MG tablet Take 100 mg by mouth daily as needed for erectile dysfunction.    No current facility-administered medications on file prior to visit.      Allergies:   Flomax [tamsulosin hcl] and Relpax [eletriptan]   Social History   Socioeconomic History  . Marital status: Married    Spouse name: Not on file  . Number of children: 2  . Years of education: Not on file  . Highest education level: Not on file  Occupational History  . Occupation: Geographical information systems officer  . Financial resource strain: Not on file  . Food insecurity:    Worry: Not on file    Inability: Not on file  . Transportation needs:    Medical: Not on file    Non-medical: Not on file  Tobacco Use  . Smoking status: Former Smoker    Packs/day: 0.50    Years: 8.00    Pack years: 4.00    Types: Cigarettes    Last attempt to quit: 1968    Years since quitting: 51.9  . Smokeless tobacco: Never Used  Substance and Sexual Activity  . Alcohol use: Yes    Comment: 02/20/2017 "couple drinks/month on average"  . Drug use: No  . Sexual activity: Yes  Lifestyle  . Physical activity:    Days per week: Not on file    Minutes per session: Not on file  . Stress: Not on file  Relationships  . Social connections:    Talks on phone: Not on file    Gets together: Not on file    Attends religious service: Not on file    Active member of club or organization: Not on file    Attends meetings of clubs or organizations: Not on file    Relationship status: Not on file  Other Topics Concern  . Not on file  Social History Narrative  . Not on file     Family History: The patient's family history  includes Stroke in his father; Sudden death in his brother; Throat cancer in his brother; Thyroid disease in his sister. There is no history of Colon cancer, Esophageal cancer, Rectal cancer, or Stomach cancer.  ROS:   Please see the history of present illness.  Additional pertinent ROS:  Constitutional: Negative for chills, fever, night sweats, unintentional weight loss  HENT: Negative for ear pain and hearing loss.   Eyes: Negative for loss of vision and eye pain.  Respiratory: Negative for cough, sputum, shortness of breath,  wheezing.   Cardiovascular: Negative for chest pain, PND, orthopnea, lower extremity edema and claudication.  Gastrointestinal: Negative for abdominal pain, melena, and hematochezia.  Genitourinary: Negative for dysuria and hematuria.  Musculoskeletal: Negative for falls and myalgias.  Skin: Negative for itching and rash.  Neurological: Negative for focal weakness, focal sensory changes and loss of consciousness.  Endo/Heme/Allergies: Does bruise/bleed easily.    EKGs/Labs/Other Studies Reviewed:    The following studies were reviewed today:  TEE 08/14/18 Study Conclusions - Left ventricle: The cavity size was normal. Wall thickness was   normal. Systolic function was mildly to moderately reduced. The   estimated ejection fraction was in the range of 40% to 45%. - Left atrium: The atrium was dilated. No evidence of thrombus in   the appendage. - Right ventricle: The cavity size was dilated. Wall thickness was   normal. - Right atrium: The atrium was dilated. No evidence of thrombus in   the atrial cavity or appendage. - Atrial septum: No defect or patent foramen ovale was identified.   Echo contrast study showed no right-to-left atrial level shunt,   following an increase in RA pressure induced by provocative   maneuvers. - Superior vena cava: The study excluded a thrombus.  Echo 08/13/18 - Left ventricle: The cavity size was normal. Wall thickness was    increased in a pattern of mild LVH. There was mild focal basal   hypertrophy of the septum. Systolic function was normal. The   estimated ejection fraction was in the range of 55% to 60%. Wall   motion was normal; there were no regional wall motion   abnormalities. The study is not technically sufficient to allow   evaluation of LV diastolic function. - Left atrium: The atrium was moderately dilated. - Pulmonary arteries: PA peak pressure: 34 mm Hg (S).  Impressions - Normal LV function; mild LVH; moderate LAE; cannot R/O left   atrial mass; suggest TEE to further assess.  Lexiscan MPI 02/2017  Nuclear stress EF: 50%.  There was no ST segment deviation noted during stress.  The study is normal.  This is a low risk study.  The left ventricular ejection fraction is mildly decreased (45-54%).  EKG:  EKG is personally reviewed.  The ekg ordered today demonstrates: to me, appears to be underlying sinus rhythm with 1st degree block with frequent PACs, almost atrial bigeminy. The end of the strip is more irregular and may be atrial fib.   Recent Labs: 08/12/2018: ALT 20; TSH 1.442 08/14/2018: Hemoglobin 12.5; Platelets 155 08/15/2018: BUN 17; Creatinine, Ser 1.12; Magnesium 2.1; Potassium 4.3; Sodium 139  Recent Lipid Panel    Component Value Date/Time   CHOL 118 08/12/2018 1642   TRIG 56 08/12/2018 1642   HDL 40 (L) 08/12/2018 1642   CHOLHDL 3.0 08/12/2018 1642   VLDL 11 08/12/2018 1642   LDLCALC 67 08/12/2018 1642   LDLDIRECT 139.2 11/21/2012 1122    Physical Exam:    VS:  BP 132/74 (BP Location: Left Arm, Patient Position: Sitting, Cuff Size: Large)   Pulse (!) 118   Ht _0  (1.88 m)   Wt 274 lb 9.6 oz (124.6 kg)   BMI 35.26 kg/m     Wt Readings from Last 3 Encounters:  10/14/18 274 lb 9.6 oz (124.6 kg)  09/17/18 275 lb (124.7 kg)  08/20/18 271 lb (122.9 kg)     GEN: Well nourished, well developed in no acute distress HEENT: Normal NECK: No JVD; No carotid  bruits LYMPHATICS:  No lymphadenopathy CARDIAC: irregular rhythm, normal S1 and S2, no murmurs, rubs, gallops. Radial and DP pulses 2+ bilaterally. RESPIRATORY:  Clear to auscultation without rales, wheezing or rhonchi  ABDOMEN: Soft, non-tender, non-distended MUSCULOSKELETAL:  No edema; No deformity  SKIN: Warm and dry NEUROLOGIC:  Alert and oriented x 3 PSYCHIATRIC:  Normal affect   ASSESSMENT:    1. Paroxysmal atrial fibrillation (HCC)   2. Atrial flutter, unspecified type ([place])   3. Essential hypertension   4. OSA (obstructive sleep apnea)   5. Counseling on health promotion and disease prevention   6. Medication management    PLAN:    1. Paroxysmal atrial fibrillation/atrial flutter: reviewed workup to date, discussed next possible steps. He has done a great deal of investigation and is very nervous about antiarrhythmics. He wants to discuss further with Dr. Caryl Comes what his chances are for successful ablation, despite his enlarged left atria, vs. Recommendations for medical management. We discussed rhythm vs. Rate control, anticoagulation, long term morbidity/mortality information re: afib at length today. He wishes to continue on anticoagulation and rate control strategy for now until he can discuss further with Dr. Caryl Comes -continue apixaban, no missed doses. Chadsvasc= at least 79 (old stroke seen on CT, age, hypertension) -continue diltiazem and metoprolol -continue use of CPAP to treat OSA -continue optimizing BP control to avoid excess stress on the heart.  -recheck heart rate with pulse ox showed rate between 66-77 bpm, so will not increase AV nodal dosing currently. -TSH normal  2. Prevention and health counseling: no symptoms, but with evidence of CVA on CT would treat as secondary prevention -recommend heart healthy/Mediterranean diet, with whole grains, fruits, vegetable, fish, lean meats, nuts, and olive oil. Limit salt. -recommend moderate walking, 3-5 times/week for 30-50  minutes each session. Aim for at least 150 minutes.week. Goal should be pace of 3 miles/hours, or walking 1.5 miles in 30 minutes -recommend avoidance of tobacco products. Avoid excess alcohol. -Additional risk factor control:  -Diabetes: A1c is not available, denies diagosis  -Lipids: LDL goal <70, most recent 67 on pravastatin  -Blood pressure control: Goal <130/80, has been at goal recently, continue diltiazem, metoprolol, losartan  -Weight: BMI 35, we have discussed lifestyle.  -he is not on aspirin for CV reasons but rather pain. Monitor closely while on anticoagulation  Plan for follow up: 6 mos or sooner PRN based on his discussion with Dr. Caryl Comes and plans for management  TIME SPENT WITH PATIENT: >40 minutes of direct patient care. More than 50% of that time was spent on coordination of care and counseling regarding afib/flutter, treatment options with risk/benefits.  Buford Dresser, MD, PhD [place]  [facility]   Medication Adjustments/Labs and Tests Ordered: Current medicines are reviewed at length with the patient today.  Concerns regarding medicines are outlined above.  No orders of the defined types were placed in this encounter.  No orders of the defined types were placed in this encounter.   Patient Instructions  -we are aiming for a heart rate of less than 90 at rest and less than 110 with mild exertion. If you use a pulse oximeter or blood pressure cuff, sit quietly in a chair for 10 minutes before you take your pulse. If your pulse remains over 110 at rest, let us know and we will adjust medications. -if you feel dizzy or lightheaded, check your pulse and make sure it isn't too slow (less than 50).   Dofetilide capsules What is this medicine? DOFETILIDE (doe FET  il ide) is an antiarrhythmic drug. It helps make your heart beat regularly. This medicine also helps to slow rapid heartbeats. This medicine may be used for other purposes; ask your health care  provider or pharmacist if you have questions. COMMON BRAND NAME(S): Tikosyn What should I tell my health care provider before I take this medicine? They need to know if you have any of these conditions: -heart disease -history of low levels of potassium or magnesium -kidney disease -liver disease -an unusual or allergic reaction to dofetilide, other medicines, foods, dyes, or preservatives -pregnant or trying to get pregnant -breast-feeding How should I use this medicine? Take this medicine by mouth with a glass of water. Follow the directions on the prescription label. You can take this medicine with or without food. Do not drink grapefruit juice with this medicine. Take your doses at regular intervals. Do not take your medicine more often than directed. Do not stop taking this medicine suddenly. This may cause serious, heart-related side effects. Your doctor will tell you how much medicine to take. If your doctor wants you to stop the medicine, the dose will be slowly lowered over time to avoid any side effects. Talk to your pediatrician regarding the use of this medicine in children. Special care may be needed. Overdosage: If you think you have taken too much of this medicine contact a poison control center or emergency room at once. NOTE: This medicine is only for you. Do not share this medicine with others. What if I miss a dose? If you miss a dose, take it as soon as you can. If it is almost time for your next dose, take only that dose. Do not take double or extra doses. What may interact with this medicine? Do not take this medicine with any of the following medications: -cimetidine -dolutegravir -hydrochlorothiazide alone or in combination with other medicines -isavuconazonium -ketoconazole -megestrol -other medicines that prolong the QT interval (cause an abnormal heart rhythm) -prochlorperazine -trimethoprim alone or in combination with sulfamethoxazole -verapamil This  medicine may also interact with the following medications: -amiloride -certain antidepressants like fluvoxamine or paroxetine -certain antiviral medicines for HIV or AIDS like atazanavir or darunavir -certain medicines for fungal infections like clotrimazole or miconazole -digoxin -diltiazem -dronabinol, THC -grapefruit juice -metformin -nefazodone -triamterene -zafirlukast This list may not describe all possible interactions. Give your health care provider a list of all the medicines, herbs, non-prescription drugs, or dietary supplements you use. Also tell them if you smoke, drink alcohol, or use illegal drugs. Some items may interact with your medicine. What should I watch for while using this medicine? Visit your doctor or health care professional for regular checks on your progress. Wear a medical ID bracelet or chain, and carry a card that describes your disease and details of your medicine and dosage times. Check your heart rate and blood pressure regularly while you are taking this medicine. Ask your doctor or health care professional what your heart rate and blood pressure should be, and when you should contact him or her. Your doctor or health care professional also may schedule regular tests to check your progress. You will be started on this medicine in a specialized facility for at least three days. You will be monitored to find the right dose of medicine for you. It is very important that you take your medicine exactly as prescribed when you leave the hospital. The correct dosing of this medicine is very important to treat your condition and prevent possible serious  side effects. What side effects may I notice from receiving this medicine? Side effects that you should report to your doctor or health care professional as soon as possible: -allergic reactions like skin rash, itching or hives, swelling of the face, lips, or tongue -breathing problems -dizziness -fast or rapid beating  of the heart -feeling faint or lightheaded -swelling of the ankles -unusually weak or tired -vomiting Side effects that usually do not require medical attention (report to your doctor or health care professional if they continue or are bothersome): -cough -diarrhea -difficulty sleeping -headache -nausea -stomach pain This list may not describe all possible side effects. Call your doctor for medical advice about side effects. You may report side effects to FDA at 1-800-FDA-1088. Where should I keep my medicine? Keep out of the reach of children. Store at room temperature between 15 and 30 degrees C (59 and 86 degrees F). Protect the medicine from moisture or humidity. Keep container tightly closed. Throw away any unused medicine after the expiration date. NOTE: This sheet is a summary. It may not cover all possible information. If you have questions about this medicine, talk to your doctor, pharmacist, or health care provider.  2018 Elsevier/Gold Standard (2016-09-04 63:14:97) Atrial Fibrillation Atrial fibrillation is a type of irregular or rapid heartbeat (arrhythmia). In atrial fibrillation, the heart quivers continuously in a chaotic pattern. This occurs when parts of the heart receive disorganized signals that make the heart unable to pump blood normally. This can increase the risk for stroke, heart failure, and other heart-related conditions. There are different types of atrial fibrillation, including:  Paroxysmal atrial fibrillation. This type starts suddenly, and it usually stops on its own shortly after it starts.  Persistent atrial fibrillation. This type often lasts longer than a week. It may stop on its own or with treatment.  Long-lasting persistent atrial fibrillation. This type lasts longer than 12 months.  Permanent atrial fibrillation. This type does not go away.  Talk with your health care provider to learn about the type of atrial fibrillation that you have. What are  the causes? This condition is caused by some heart-related conditions or procedures, including:  A heart attack.  Coronary artery disease.  Heart failure.  Heart valve conditions.  High blood pressure.  Inflammation of the sac that surrounds the heart (pericarditis).  Heart surgery.  Certain heart rhythm disorders, such as Wolf-Parkinson-White syndrome.  Other causes include:  Pneumonia.  Obstructive sleep apnea.  Blockage of an artery in the lungs (pulmonary embolism, or PE).  Lung cancer.  Chronic lung disease.  Thyroid problems, especially if the thyroid is overactive (hyperthyroidism).  Caffeine.  Excessive alcohol use or illegal drug use.  Use of some medicines, including certain decongestants and diet pills.  Sometimes, the cause cannot be found. What increases the risk? This condition is more likely to develop in:  People who are older in age.  People who smoke.  People who have diabetes mellitus.  People who are overweight (obese).  Athletes who exercise vigorously.  What are the signs or symptoms? Symptoms of this condition include:  A feeling that your heart is beating rapidly or irregularly.  A feeling of discomfort or pain in your chest.  Shortness of breath.  Sudden light-headedness or weakness.  Getting tired easily during exercise.  In some cases, there are no symptoms. How is this diagnosed? Your health care provider may be able to detect atrial fibrillation when taking your pulse. If detected, this condition may be diagnosed with:  An electrocardiogram (ECG).  A Holter monitor test that records your heartbeat patterns over a 24-hour period.  Transthoracic echocardiogram (TTE) to evaluate how blood flows through your heart.  Transesophageal echocardiogram (TEE) to view more detailed images of your heart.  A stress test.  Imaging tests, such as a CT scan or chest X-ray.  Blood tests.  How is this treated? The main  goals of treatment are to prevent blood clots from forming and to keep your heart beating at a normal rate and rhythm. The type of treatment that you receive depends on many factors, such as your underlying medical conditions and how you feel when you are experiencing atrial fibrillation. This condition may be treated with:  Medicine to slow down the heart rate, bring the heart's rhythm back to normal, or prevent clots from forming.  Electrical cardioversion. This is a procedure that resets your heart's rhythm by delivering a controlled, low-energy shock to the heart through your skin.  Different types of ablation, such as catheter ablation, catheter ablation with pacemaker, or surgical ablation. These procedures destroy the heart tissues that send abnormal signals. When the pacemaker is used, it is placed under your skin to help your heart beat in a regular rhythm.  Follow these instructions at home:  Take over-the counter and prescription medicines only as told by your health care provider.  If your health care provider prescribed a blood-thinning medicine (anticoagulant), take it exactly as told. Taking too much blood-thinning medicine can cause bleeding. If you do not take enough blood-thinning medicine, you will not have the protection that you need against stroke and other problems.  Do not use tobacco products, including cigarettes, chewing tobacco, and e-cigarettes. If you need help quitting, ask your health care provider.  If you have obstructive sleep apnea, manage your condition as told by your health care provider.  Do not drink alcohol.  Do not drink beverages that contain caffeine, such as coffee, soda, and tea.  Maintain a healthy weight. Do not use diet pills unless your health care provider approves. Diet pills may make heart problems worse.  Follow diet instructions as told by your health care provider.  Exercise regularly as told by your health care provider.  Keep all  follow-up visits as told by your health care provider. This is important. How is this prevented?  Avoid drinking beverages that contain caffeine or alcohol.  Avoid certain medicines, especially medicines that are used for breathing problems.  Avoid certain herbs and herbal medicines, such as those that contain ephedra or ginseng.  Do not use illegal drugs, such as cocaine and amphetamines.  Do not smoke.  Manage your high blood pressure. Contact a health care provider if:  You notice a change in the rate, rhythm, or strength of your heartbeat.  You are taking an anticoagulant and you notice increased bruising.  You tire more easily when you exercise or exert yourself. Get help right away if:  You have chest pain, abdominal pain, sweating, or weakness.  You feel nauseous.  You notice blood in your vomit, bowel movement, or urine.  You have shortness of breath.  You suddenly have swollen feet and ankles.  You feel dizzy.  You have sudden weakness or numbness of the face, arm, or leg, especially on one side of the body.  You have trouble speaking, trouble understanding, or both (aphasia).  Your face or your eyelid droops on one side. These symptoms may represent a serious problem that is an emergency. Do  not wait to see if the symptoms will go away. Get medical help right away. Call your local emergency services (911 in the [place] Do not drive yourself to the hospital. This information is not intended to replace advice given to you by your health care provider. Make sure you discuss any questions you have with your health care provider. Document Released: 10/23/2005 Document Revised: 03/01/2016 Document Reviewed: 02/17/2015 Elsevier Interactive Patient Education  2018 [employer].     Medication Instructions:  Continue same medications If you need a refill on your cardiac medications before your next appointment, please call your pharmacy.   Lab work: None  ordered   Testing/Procedures: None ordered  Follow-Up: At [facility], you and your health needs are our priority.  As part of our continuing mission to provide you with exceptional heart care, we have created designated Provider Care Teams.  These Care Teams include your primary Cardiologist (physician) and Advanced Practice Providers (APPs -  Physician Assistants and Nurse Practitioners) who all work together to provide you with the care you need, when you need it. . Follow Up with Dr.Dellanira Dillow in 6 months  Call 2 months before to schedule      Signed, Buford Dresser, MD PhD 10/14/2018 8:00 AM    [place]

## 2018-10-15 ENCOUNTER — Encounter: Payer: Self-pay | Admitting: Cardiology

## 2018-10-23 ENCOUNTER — Ambulatory Visit: Payer: Medicare Other | Admitting: Internal Medicine

## 2018-11-11 ENCOUNTER — Ambulatory Visit: Payer: Medicare Other | Admitting: Physician Assistant

## 2018-11-12 ENCOUNTER — Ambulatory Visit: Payer: Medicare Other | Admitting: Internal Medicine

## 2018-11-18 DIAGNOSIS — N400 Enlarged prostate without lower urinary tract symptoms: Secondary | ICD-10-CM | POA: Diagnosis not present

## 2018-11-18 DIAGNOSIS — M25562 Pain in left knee: Secondary | ICD-10-CM | POA: Diagnosis not present

## 2018-11-18 DIAGNOSIS — Z6834 Body mass index (BMI) 34.0-34.9, adult: Secondary | ICD-10-CM | POA: Diagnosis not present

## 2018-11-18 DIAGNOSIS — M25561 Pain in right knee: Secondary | ICD-10-CM | POA: Diagnosis not present

## 2018-11-18 DIAGNOSIS — E785 Hyperlipidemia, unspecified: Secondary | ICD-10-CM | POA: Diagnosis not present

## 2018-11-18 DIAGNOSIS — I10 Essential (primary) hypertension: Secondary | ICD-10-CM | POA: Diagnosis not present

## 2018-11-18 DIAGNOSIS — L80 Vitiligo: Secondary | ICD-10-CM | POA: Diagnosis not present

## 2018-11-18 DIAGNOSIS — E669 Obesity, unspecified: Secondary | ICD-10-CM | POA: Diagnosis not present

## 2018-11-18 DIAGNOSIS — M5136 Other intervertebral disc degeneration, lumbar region: Secondary | ICD-10-CM | POA: Diagnosis not present

## 2018-11-18 DIAGNOSIS — I4891 Unspecified atrial fibrillation: Secondary | ICD-10-CM | POA: Diagnosis not present

## 2018-12-10 ENCOUNTER — Ambulatory Visit: Payer: Medicare Other | Admitting: Internal Medicine

## 2018-12-17 ENCOUNTER — Ambulatory Visit: Payer: Medicare HMO | Admitting: Internal Medicine

## 2018-12-17 ENCOUNTER — Encounter: Payer: Self-pay | Admitting: Internal Medicine

## 2018-12-17 ENCOUNTER — Encounter (INDEPENDENT_AMBULATORY_CARE_PROVIDER_SITE_OTHER): Payer: Self-pay

## 2018-12-17 VITALS — BP 116/78 | HR 100 | Ht 74.0 in | Wt 276.4 lb

## 2018-12-17 DIAGNOSIS — Z9989 Dependence on other enabling machines and devices: Secondary | ICD-10-CM | POA: Diagnosis not present

## 2018-12-17 DIAGNOSIS — I4892 Unspecified atrial flutter: Secondary | ICD-10-CM | POA: Diagnosis not present

## 2018-12-17 DIAGNOSIS — G4733 Obstructive sleep apnea (adult) (pediatric): Secondary | ICD-10-CM

## 2018-12-17 MED ORDER — FUROSEMIDE 20 MG PO TABS: 20.0000 mg | ORAL_TABLET | Freq: Every day | ORAL | 5 refills | Status: DC | PRN

## 2018-12-17 NOTE — Progress Notes (Signed)
Patient Care Team: Vivi Barrack, MD as PCP - General (Family Medicine) Buford Dresser, MD as PCP - Cardiology (Cardiology)   HPI  Gordon Phillips is a 73 y.o. male Seen in hospital 10/19 for atrial flutter.  High CHADS-VASc score with prior stroke precludes discontinuation of anticoagulation even with catheter ablation.  Underwent in-hospital TEE guided cardioversion.  Has reverted to atrial flutter.  He is aware of recurrent tachypalpitations associated with shortness of breath with exertion, drinking soda.  Dyspnea on exertion and fatigue when he works.  Some peripheral edema.  No nocturnal dyspnea.  Tolerating anticoagulation without bleeding.   Significant daytime somnolence; treated sleep apnea with CPAP card followed  DATE TEST EF   4/18 MYOVIEW 50 No Ischemia  10/19 Echo   55-60  % LAE (51/2.0/35)  10/19 TEE  45 %         Date Cr K Hgb  10/19 1.12 4.3 12.5           Records and Results Reviewed   Past Medical History:  Diagnosis Date  . ALLERGIC RHINITIS 01/25/2010  . Arthritis    "neck, back, shoulders," (02/20/2017)  . Chronic lower back pain   . High cholesterol   . Hx of cardiovascular stress test    a. Lexiscan Myoview 02/06/13:  EF 59%, no ischemia or scar  . Hx of echocardiogram    a.  Echo 02/03/13:  Mild LVH, mild focal basal septal hypertrophy, EF 50-55%, Gr 1 DD, mild LAE  . HYPERTENSION 01/25/2010  . HYPERTHYROIDISM 01/25/2010   S/P radioactive iodine "back in the 1980s" (02/20/2017)  . Impaired glucose tolerance   . 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 Obesity   . OSA on CPAP    Uses CPAP nightly  . Pericarditis 1980's  . 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  Marland Kitchen Vitiligo     Past Surgical History:  Procedure Laterality Date  . CARDIOVERSION N/A 08/14/2018   Procedure: CARDIOVERSION;  Surgeon: [name], MD;  Location: [facility] ENDOSCOPY;  Service: Cardiovascular;   Laterality: N/A;  . INGUINAL HERNIA REPAIR Bilateral   . LUMBAR LAMINECTOMY    . TEE WITHOUT CARDIOVERSION N/A 08/14/2018   Procedure: TRANSESOPHAGEAL ECHOCARDIOGRAM (TEE);  Surgeon: [name], MD;  Location[facility] ENDOSCOPY;  Service: Cardiovascular;  Laterality: N/A;  . UMBILICAL HERNIA REPAIR      Current Meds  Medication Sig  . apixaban (ELIQUIS) 5 MG TABS tablet Take 1 tablet (5 mg total) by mouth 2 (two) times daily.  . Aromatic Inhalants (VICKS VAPOINHALER) INHA Place 1 puff into both nostrils as needed (for congestion or stuffiness).  Skipper Cliche SALINE NASAL DROPS NA Place 2 sprays into both nostrils as needed (for congestion).  Marland Kitchen diltiazem (CARDIZEM CD) 120 MG 24 hr capsule Take 1 capsule (120 mg total) by mouth daily.  Marland Kitchen doxazosin (CARDURA) 2 MG tablet TAKE 1 TABLET AT BEDTIME  . fluticasone (FLONASE) 50 MCG/ACT nasal spray Place 2 sprays into both nostrils as needed for allergies or rhinitis.  Marland Kitchen guaiFENesin-codeine 100-10 MG/5ML syrup Take 5 mLs by mouth 3 (three) times daily as needed for cough.  Marland Kitchen ipratropium (ATROVENT) 0.06 % nasal spray Place 2 sprays into both nostrils 4 (four) times daily.  Marland Kitchen losartan (COZAAR) 100 MG tablet Take 1 tablet (100 mg total) by mouth at bedtime.  . metoprolol succinate (TOPROL-XL) 100 MG 24 hr  tablet Take 1 tablet (100 mg total) by mouth at bedtime. Take with or immediately following a meal.  . pravastatin (PRAVACHOL) 40 MG tablet Take 1 tablet (40 mg total) by mouth at bedtime.  Marland Kitchen PRESCRIPTION MEDICATION CPAP: At bedtime  . sildenafil (VIAGRA) 100 MG tablet Take 100 mg by mouth daily as needed for erectile dysfunction.     Allergies  Allergen Reactions  . Flomax [Tamsulosin Hcl] Itching  . Relpax [Eletriptan] Other (See Comments) and Hypertension    Chest pain and sweating      Review of Systems negative except from HPI and PMH  Physical Exam BP 116/78   Pulse 100   Ht 6\' 2"  (1.88 m)   Wt 276 lb 6.4 oz (125.4 kg)   SpO2 94%   BMI  35.49 kg/m  Well developed and well nourished in no acute distress HENT normal E scleral and icterus clear Neck Supple JVP flat; carotids brisk and full Clear to ausculation Irregularly irregular rate and rhythm, no murmurs gallops or rub Soft with active bowel sounds No clubbing cyanosis 2+ Edema Alert and oriented, grossly normal motor and sensory function Skin Warm and Dry  ECG demonstrates atrial flutter-typical with variable conduction Heart rate 100 Intervals-/zero 8/35 Rightward axis at 99  Assessment and  Plan  Atrial flutter-recurrent  HFpEF  Obesity  Sleep apnea-treated  TIA  The patient has recurrent atrial flutter which is symptomatic.  Hence, we discussed undertaking catheter ablation for symptom relief realizing that will not afford discontinuation of anticoagulation.  We have reviewed risks and benefits and he is agreeable to proceeding.  He is exceedingly anxious about his arrhythmia.  I have assured him that anticoagulation is the most important thing that he can do for his wellbeing.  At this point, his atrial flutter is more obnoxious than noxious.  Volume overloaded.  We will begin him on low-dose diuretics furosemide 20 mg daily x3 days and then as needed.  Will be done under general anesthesia.  On Anticoagulation;  No bleeding issues  Will need to check labs    Current medicines are reviewed at length with the patient today .  The patient does not  have concerns regarding medicines.

## 2018-12-17 NOTE — Patient Instructions (Signed)
Medication Instructions:  Your physician has recommended you make the following change in your medication:   1. Lasix, take 20mg  Lasix, one tablet per day for the next 3 days. Then take one per day as needed for fluid overload.  Labwork: Your physician recommends that you return for lab work in:   The week of March 9th for a CBC and BMP  Testing/Procedures: Your physician has recommended that you have an ablation. Catheter ablation is a medical procedure used to treat some cardiac arrhythmias (irregular heartbeats). During catheter ablation, a long, thin, flexible tube is put into a blood vessel in your groin (upper thigh), or neck. This tube is called an ablation catheter. It is then guided to your heart through the blood vessel. Radio frequency waves destroy small areas of heart tissue where abnormal heartbeats may cause an arrhythmia to start. Please see the instruction sheet given to you today.  Follow-Up: Your physician recommends that you schedule a follow-up appointment in:   4 weeks after March 20th with Dr Caryl Comes for a post procedural follow up.  Any Other Special Instructions Will Be Listed Below (If Applicable).     If you need a refill on your cardiac medications before your next appointment, please call your pharmacy.

## 2019-01-06 ENCOUNTER — Other Ambulatory Visit: Payer: Self-pay | Admitting: Family Medicine

## 2019-01-06 NOTE — Telephone Encounter (Signed)
Copied from [place] [id number]. Topic: Quick Communication - Rx Refill/Question >> Jan 06, 2019 12:43 PM Sheppard Coil, [employer] L wrote: Medication:  apixaban (ELIQUIS) 5 MG TABS tablet diltiazem (CARDIZEM CD) 120 MG 24 hr capsule doxazosin (CARDURA) 2 MG tablet losartan (COZAAR) 100 MG tablet metoprolol succinate (TOPROL-XL) 100 MG 24 hr tablet pravastatin (PRAVACHOL) 40 MG tablet  Has the patient contacted their pharmacy? Yes - needs to transfer medications to new pharmacy due to insurance. (Agent: If no, request that the patient contact the pharmacy for the refill.) (Agent: If yes, when and what did the pharmacy advise?)  Preferred Pharmacy (with phone number or street name): [id number] [address], [place] [phone] (Phone) [phone] (Fax)  Agent: Please be advised that RX refills may take up to 3 business days. We ask that you follow-up with your pharmacy.

## 2019-01-07 MED ORDER — DOXAZOSIN MESYLATE 2 MG PO TABS: 2.0000 mg | ORAL_TABLET | Freq: Every day | ORAL | 3 refills | Status: DC

## 2019-01-07 MED ORDER — METOPROLOL SUCCINATE ER 100 MG PO TB24: 100.0000 mg | ORAL_TABLET | Freq: Every day | ORAL | 3 refills | Status: DC

## 2019-01-07 MED ORDER — APIXABAN 5 MG PO TABS: 5.0000 mg | ORAL_TABLET | Freq: Two times a day (BID) | ORAL | 3 refills | Status: DC

## 2019-01-07 MED ORDER — PRAVASTATIN SODIUM 40 MG PO TABS: 40.0000 mg | ORAL_TABLET | Freq: Every day | ORAL | 3 refills | Status: DC

## 2019-01-07 MED ORDER — DILTIAZEM HCL ER COATED BEADS 120 MG PO CP24: 120.0000 mg | ORAL_CAPSULE | Freq: Every day | ORAL | 3 refills | Status: DC

## 2019-01-07 MED ORDER — LOSARTAN POTASSIUM 100 MG PO TABS: 100.0000 mg | ORAL_TABLET | Freq: Every day | ORAL | 3 refills | Status: DC

## 2019-01-09 ENCOUNTER — Other Ambulatory Visit: Payer: Self-pay

## 2019-01-13 ENCOUNTER — Other Ambulatory Visit: Payer: Medicare HMO | Admitting: *Deleted

## 2019-01-13 DIAGNOSIS — Z9989 Dependence on other enabling machines and devices: Secondary | ICD-10-CM

## 2019-01-13 DIAGNOSIS — G4733 Obstructive sleep apnea (adult) (pediatric): Secondary | ICD-10-CM

## 2019-01-13 DIAGNOSIS — I4892 Unspecified atrial flutter: Secondary | ICD-10-CM

## 2019-01-14 ENCOUNTER — Telehealth: Payer: Self-pay | Admitting: Family Medicine

## 2019-01-14 LAB — CBC
Hematocrit: 38.2 % (ref 37.5–51.0)
Hemoglobin: 13.3 g/dL (ref 13.0–17.7)
MCH: 30.7 pg (ref 26.6–33.0)
MCHC: 34.8 g/dL (ref 31.5–35.7)
MCV: 88 fL (ref 79–97)
Platelets: 136 10*3/uL — ABNORMAL LOW (ref 150–450)
RBC: 4.33 x10E6/uL (ref 4.14–5.80)
RDW: 13.4 % (ref 11.6–15.4)
WBC: 5.7 10*3/uL (ref 3.4–10.8)

## 2019-01-14 LAB — BASIC METABOLIC PANEL
BUN/Creatinine Ratio: 13 (ref 10–24)
BUN: 14 mg/dL (ref 8–27)
CO2: 21 mmol/L (ref 20–29)
Calcium: 9.7 mg/dL (ref 8.6–10.2)
Chloride: 103 mmol/L (ref 96–106)
Creatinine, Ser: 1.05 mg/dL (ref 0.76–1.27)
GFR calc Af Amer: 82 mL/min/{1.73_m2} (ref >59)
GFR calc non Af Amer: 71 mL/min/{1.73_m2} (ref >59)
Glucose: 138 mg/dL — ABNORMAL HIGH (ref 65–99)
Potassium: 4.2 mmol/L (ref 3.5–5.2)
Sodium: 139 mmol/L (ref 134–144)

## 2019-01-14 NOTE — Telephone Encounter (Signed)
Copied from [place] [id number]. Topic: Quick Communication - See Telephone Encounter >> Jan 14, 2019  2:14 PM Rutherford Nail, NT wrote: CRM for notification. See Telephone encounter for: 01/14/19. Patient calling and states that his pharmacy states that they are out of the losartan (COZAAR) 100 MG tablet and cannot get it due to the backorder. Would like to know if there is an alternative that could be sent in because he is almost out of this medication? Please advise.   [id number] - [place], [place] [address]

## 2019-01-14 NOTE — Telephone Encounter (Signed)
Please advise 

## 2019-01-15 ENCOUNTER — Other Ambulatory Visit: Payer: Self-pay

## 2019-01-15 MED ORDER — OLMESARTAN MEDOXOMIL 40 MG PO TABS: 40.0000 mg | ORAL_TABLET | Freq: Every day | ORAL | 3 refills | Status: DC

## 2019-01-15 NOTE — Telephone Encounter (Signed)
Rx sent to pharmacy   

## 2019-01-15 NOTE — Telephone Encounter (Signed)
Ok to send in olmesartan 40mg  once daily.  Algis Greenhouse. [name], MD 01/15/2019 8:37 AM

## 2019-01-20 ENCOUNTER — Telehealth: Payer: Self-pay | Admitting: Internal Medicine

## 2019-01-20 NOTE — Telephone Encounter (Signed)
Called patient to reschedule elective ablation of atrial flutter in the setting of atrial fib and maybe atrial tachycardia.    He has concerns also re proceeding with a procedure at this point--we will cancel and anticipate reviewing things in about 6-8 weeks as Covid-19 allows

## 2019-01-24 ENCOUNTER — Encounter (HOSPITAL_COMMUNITY): Payer: Self-pay

## 2019-01-24 ENCOUNTER — Ambulatory Visit (HOSPITAL_COMMUNITY): Admit: 2019-01-24 | Payer: Medicare HMO | Admitting: Internal Medicine

## 2019-01-24 SURGERY — A-FLUTTER ABLATION
Anesthesia: General

## 2019-02-11 ENCOUNTER — Other Ambulatory Visit: Payer: Self-pay

## 2019-02-11 ENCOUNTER — Inpatient Hospital Stay: Payer: Medicare HMO | Attending: Hematology

## 2019-02-11 DIAGNOSIS — D472 Monoclonal gammopathy: Secondary | ICD-10-CM | POA: Insufficient documentation

## 2019-02-11 LAB — CMP (CANCER CENTER ONLY)
ALT: 17 U/L (ref 0–44)
AST: 20 U/L (ref 15–41)
Albumin: 4.1 g/dL (ref 3.5–5.0)
Alkaline Phosphatase: 59 U/L (ref 38–126)
Anion gap: 8 (ref 5–15)
BUN: 13 mg/dL (ref 8–23)
CO2: 24 mmol/L (ref 22–32)
Calcium: 9.1 mg/dL (ref 8.9–10.3)
Chloride: 107 mmol/L (ref 98–111)
Creatinine: 1.13 mg/dL (ref 0.61–1.24)
GFR, Est AFR Am: >60 mL/min (ref >60)
GFR, Estimated: >60 mL/min (ref >60)
Glucose, Bld: 164 mg/dL — ABNORMAL HIGH (ref 70–99)
Potassium: 4.1 mmol/L (ref 3.5–5.1)
Sodium: 139 mmol/L (ref 135–145)
Total Bilirubin: 1.2 mg/dL (ref 0.3–1.2)
Total Protein: 7.5 g/dL (ref 6.5–8.1)

## 2019-02-11 LAB — CBC WITH DIFFERENTIAL (CANCER CENTER ONLY)
Abs Immature Granulocytes: 0.01 10*3/uL (ref 0.00–0.07)
Basophils Absolute: 0 10*3/uL (ref 0.0–0.1)
Basophils Relative: 0 %
Eosinophils Absolute: 0.2 10*3/uL (ref 0.0–0.5)
Eosinophils Relative: 3 %
HCT: 40.3 % (ref 39.0–52.0)
Hemoglobin: 12.9 g/dL — ABNORMAL LOW (ref 13.0–17.0)
Immature Granulocytes: 0 %
Lymphocytes Relative: 35 %
Lymphs Abs: 1.8 10*3/uL (ref 0.7–4.0)
MCH: 29.7 pg (ref 26.0–34.0)
MCHC: 32 g/dL (ref 30.0–36.0)
MCV: 92.6 fL (ref 80.0–100.0)
Monocytes Absolute: 0.3 10*3/uL (ref 0.1–1.0)
Monocytes Relative: 5 %
Neutro Abs: 3 10*3/uL (ref 1.7–7.7)
Neutrophils Relative %: 57 %
Platelet Count: 118 10*3/uL — ABNORMAL LOW (ref 150–400)
RBC: 4.35 MIL/uL (ref 4.22–5.81)
RDW: 13.9 % (ref 11.5–15.5)
WBC Count: 5.3 10*3/uL (ref 4.0–10.5)
nRBC: 0 % (ref 0.0–0.2)

## 2019-02-12 ENCOUNTER — Other Ambulatory Visit: Payer: Medicare Other

## 2019-02-12 LAB — MULTIPLE MYELOMA PANEL, SERUM
Albumin SerPl Elph-Mcnc: 3.9 g/dL (ref 2.9–4.4)
Albumin/Glob SerPl: 1.4 (ref 0.7–1.7)
Alpha 1: 0.2 g/dL (ref 0.0–0.4)
Alpha2 Glob SerPl Elph-Mcnc: 0.9 g/dL (ref 0.4–1.0)
B-Globulin SerPl Elph-Mcnc: 0.8 g/dL (ref 0.7–1.3)
Gamma Glob SerPl Elph-Mcnc: 1.2 g/dL (ref 0.4–1.8)
Globulin, Total: 3 g/dL (ref 2.2–3.9)
IgA: 112 mg/dL (ref 61–437)
IgG (Immunoglobin G), Serum: 1293 mg/dL (ref 603–1613)
IgM (Immunoglobulin M), Srm: 58 mg/dL (ref 15–143)
M Protein SerPl Elph-Mcnc: 0.4 g/dL — ABNORMAL HIGH
Total Protein ELP: 6.9 g/dL (ref 6.0–8.5)

## 2019-02-12 LAB — KAPPA/LAMBDA LIGHT CHAINS
Kappa free light chain: 11.9 mg/L (ref 3.3–19.4)
Kappa, lambda light chain ratio: 1.45 (ref 0.26–1.65)
Lambda free light chains: 8.2 mg/L (ref 5.7–26.3)

## 2019-02-14 ENCOUNTER — Telehealth: Payer: Self-pay

## 2019-02-14 NOTE — Telephone Encounter (Signed)
Left voice message for patient regarding lab results.  Per Dr. Burr Medico M-protein is stable, no other concerns.

## 2019-02-14 NOTE — Telephone Encounter (Signed)
-----   Message from Truitt Merle, MD sent at 02/14/2019  7:35 AM EDT ----- Please let pt know his lab results, M-protein stable, no other concerns, thanks  Truitt Merle  02/14/2019

## 2019-02-25 ENCOUNTER — Ambulatory Visit: Payer: Medicare HMO | Admitting: Internal Medicine

## 2019-03-20 ENCOUNTER — Telehealth: Payer: Self-pay | Admitting: Cardiology

## 2019-03-20 DIAGNOSIS — I4892 Unspecified atrial flutter: Secondary | ICD-10-CM

## 2019-03-20 NOTE — Telephone Encounter (Signed)
Old phone note suggests maybe rescheduling an ablation will forward to Dollene Primrose RN and Dr Caryl Comes. Not sure who called .Adonis Housekeeper

## 2019-03-20 NOTE — Telephone Encounter (Signed)
New message    Patient states that he is returning a call. I didn't see a note in the system. Please return call.

## 2019-03-25 NOTE — Telephone Encounter (Signed)
Pt has been scheduled for A Flutter ablation on June 17th with Dr Caryl Comes @ 0830. Pt understands I will call him later this week to discuss pre procedural and COVID screening instructions.

## 2019-03-26 ENCOUNTER — Ambulatory Visit: Payer: Self-pay | Admitting: *Deleted

## 2019-03-26 ENCOUNTER — Ambulatory Visit (HOSPITAL_COMMUNITY)
Admission: EM | Admit: 2019-03-26 | Discharge: 2019-03-26 | Disposition: A | Discharge status: Home / Self Care | Payer: Medicare HMO | Attending: Family Medicine | Admitting: Family Medicine

## 2019-03-26 ENCOUNTER — Encounter (HOSPITAL_COMMUNITY): Payer: Self-pay | Admitting: Emergency Medicine

## 2019-03-26 ENCOUNTER — Other Ambulatory Visit: Payer: Self-pay

## 2019-03-26 DIAGNOSIS — R310 Gross hematuria: Secondary | ICD-10-CM | POA: Diagnosis not present

## 2019-03-26 LAB — CBC
HCT: 43.2 % (ref 39.0–52.0)
Hemoglobin: 14.4 g/dL (ref 13.0–17.0)
MCH: 29.9 pg (ref 26.0–34.0)
MCHC: 33.3 g/dL (ref 30.0–36.0)
MCV: 89.8 fL (ref 80.0–100.0)
Platelets: 130 10*3/uL — ABNORMAL LOW (ref 150–400)
RBC: 4.81 MIL/uL (ref 4.22–5.81)
RDW: 13.4 % (ref 11.5–15.5)
WBC: 5.7 10*3/uL (ref 4.0–10.5)
nRBC: 0 % (ref 0.0–0.2)

## 2019-03-26 LAB — PROTIME-INR
INR: 1.3 — ABNORMAL HIGH (ref 0.8–1.2)
Prothrombin Time: 16.3 seconds — ABNORMAL HIGH (ref 11.4–15.2)

## 2019-03-26 LAB — POCT URINALYSIS DIP (DEVICE)
Glucose, UA: 500 mg/dL — AB
Ketones, ur: >=160 mg/dL — AB
Nitrite: POSITIVE — AB
Protein, ur: >=300 mg/dL — AB
Specific Gravity, Urine: <=1.005 (ref 1.005–1.030)
Urobilinogen, UA: >=8 mg/dL (ref 0.0–1.0)
pH: >=9 (ref 5.0–8.0)

## 2019-03-26 LAB — BASIC METABOLIC PANEL
Anion gap: 9 (ref 5–15)
BUN: 15 mg/dL (ref 8–23)
CO2: 24 mmol/L (ref 22–32)
Calcium: 9.6 mg/dL (ref 8.9–10.3)
Chloride: 108 mmol/L (ref 98–111)
Creatinine, Ser: 1.11 mg/dL (ref 0.61–1.24)
GFR calc Af Amer: >60 mL/min (ref >60)
GFR calc non Af Amer: >60 mL/min (ref >60)
Glucose, Bld: 150 mg/dL — ABNORMAL HIGH (ref 70–99)
Potassium: 4 mmol/L (ref 3.5–5.1)
Sodium: 141 mmol/L (ref 135–145)

## 2019-03-26 LAB — APTT: aPTT: 33 seconds (ref 24–36)

## 2019-03-26 LAB — PSA: Prostatic Specific Antigen: 3.43 ng/mL (ref 0.00–4.00)

## 2019-03-26 NOTE — Telephone Encounter (Signed)
See note

## 2019-03-26 NOTE — Discharge Instructions (Addendum)
I will call with the results of your blood work and urine culture  In the mean time please drink plenty of fluids  Follow up with primary/urology if symptoms persisting

## 2019-03-26 NOTE — ED Triage Notes (Signed)
Pt sts noted hematuria last night and again today; pt sts on blood thinners

## 2019-03-26 NOTE — Telephone Encounter (Signed)
Patient is calling to report ha has had blood in urine for almost 24 hours now. Due to the timing of the call- the recommendation is UC and follow up with PCP tomorrow. Stressed importance of this with patient. He voices understanding.  Reason for Disposition . Taking Coumadin (warfarin) or other strong blood thinner, or known bleeding disorder (e.g., thrombocytopenia)  Answer Assessment - Initial Assessment Questions 1. COLOR of URINE: "Describe the color of the urine."  (e.g., tea-colored, pink, red, blood clots, bloody)     Very red last night- this morning it was lighter- heavier this afternoon 2. ONSET: "When did the bleeding start?"      Started last night 3. EPISODES: "How many times has there been blood in the urine?" or "How many times today?"     Every time- this morning it was lighter 4. PAIN with URINATION: "Is there any pain with passing your urine?" If so, ask: "How bad is the pain?"  (Scale 1-10; or mild, moderate, severe)    - MILD - complains slightly about urination hurting    - MODERATE - interferes with normal activities      - SEVERE - excruciating, unwilling or unable to urinate because of the pain      no 5. FEVER: "Do you have a fever?" If so, ask: "What is your temperature, how was it measured, and when did it start?"     no 6. ASSOCIATED SYMPTOMS: "Are you passing urine more frequently than usual?"     no 7. OTHER SYMPTOMS: "Do you have any other symptoms?" (e.g., back/flank pain, abdominal pain, vomiting)     consistent back pain- no changes 8. PREGNANCY: "Is there any chance you are pregnant?" "When was your last menstrual period?"     n/a  Protocols used: URINE - BLOOD IN-A-AH

## 2019-03-27 ENCOUNTER — Other Ambulatory Visit: Payer: Self-pay

## 2019-03-27 DIAGNOSIS — R310 Gross hematuria: Secondary | ICD-10-CM

## 2019-03-27 LAB — URINE CULTURE: Culture: <10000 — AB

## 2019-03-27 NOTE — Telephone Encounter (Signed)
Patient was seen in UC.

## 2019-03-27 NOTE — ED Provider Notes (Signed)
[place]    CSN: [id number] Arrival date & time: 03/26/19  1738     History   Chief Complaint Chief Complaint  Patient presents with  . Hematuria    HPI Gordon Phillips is a 73 y.o. male history of hypertension, A. fib on Eliquis, previous CVA, BPH presenting today for evaluation of hematuria.  Patient states that over the past 24 hours he has noted to have blood in his urine.  States that symptoms began last night, initially related this to food as he had had a lot of strawberry ice cream as well as cranberry juice.  Symptoms persisted this morning and has seen more blood with each urination.  He denies any pain associated with this including denying back pain, abdominal pain, denies testicular pain.  Denies dysuria or increased frequency of urination.  Denies increase in hesitation, urgency, change in stream from his normal.  Is currently on doxazosin for BPH.  States that is concerned as he has not had his PSA evaluated in quite some time.  Denies family history of prostate cancer.  Denies history of UTI/prostatitis.  Denies history of kidney stones.  Denies history of any kidney issues.  Patient did recently start Eliquis over the past 3 months and has been on a stable dose.  He has planned cardioversion on 6/17 for A. fib.  He denies easily bleeding from gums or easily bleeding elsewhere or bruising.  Denies nausea or vomiting.  Denies fever chills or body aches.  HPI  Past Medical History:  Diagnosis Date  . ALLERGIC RHINITIS 01/25/2010  . Arthritis    "neck, back, shoulders," (02/20/2017)  . Chronic lower back pain   . High cholesterol   . Hx of cardiovascular stress test    a. Lexiscan Myoview 02/06/13:  EF 59%, no ischemia or scar  . Hx of echocardiogram    a.  Echo 02/03/13:  Mild LVH, mild focal basal septal hypertrophy, EF 50-55%, Gr 1 DD, mild LAE  . HYPERTENSION 01/25/2010  . HYPERTHYROIDISM 01/25/2010   S/P radioactive iodine "back in the 1980s" (02/20/2017)  .  Impaired glucose tolerance   . 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 Obesity   . OSA on CPAP    Uses CPAP nightly  . Pericarditis 1980's  . Stroke [facility])    Old L basal ganglia infarct by CT 01/2013.  Marland Kitchen Vitiligo     Patient Active Problem List   Diagnosis Date Noted  . Deviated septum 08/20/2018  . BPH (benign prostatic hyperplasia) 08/20/2018  . Cough 08/20/2018  . Anal irritation 08/20/2018  . Atrial flutter ([place]) 08/12/2018  . HLD (hyperlipidemia) 02/20/2017  . History of CVA (cerebrovascular accident) 02/20/2017  . Back pain, chronic 11/22/2015  . Morbid obesity ([place]) 05/31/2015  . MGUS (monoclonal gammopathy of unknown significance) 01/21/2015  . OSA (obstructive sleep apnea) 03/16/2014  . Impaired glucose tolerance 05/27/2012  . Thyrotoxicosis 01/25/2010  . Essential hypertension 01/25/2010  . Allergic rhinitis 01/25/2010    Past Surgical History:  Procedure Laterality Date  . CARDIOVERSION N/A 08/14/2018   Procedure: CARDIOVERSION;  Surgeon: [name], MD;  Location: [facility] ENDOSCOPY;  Service: Cardiovascular;  Laterality: N/A;  . INGUINAL HERNIA REPAIR Bilateral   . LUMBAR LAMINECTOMY    . TEE WITHOUT CARDIOVERSION N/A 08/14/2018   Procedure: TRANSESOPHAGEAL ECHOCARDIOGRAM (TEE);  Surgeon: [name], MD;  Location: [facility] ENDOSCOPY;  Service: Cardiovascular;  Laterality: N/A;  . UMBILICAL HERNIA REPAIR  Home Medications    Prior to Admission medications   Medication Sig Start Date End Date Taking? Authorizing Provider  acetaminophen (TYLENOL) 650 MG CR tablet Take 1,300 mg by mouth daily.    [provider]  apixaban (ELIQUIS) 5 MG TABS tablet Take 1 tablet (5 mg total) by mouth 2 (two) times daily. 01/07/19   Vivi Barrack, MD  Aromatic Inhalants (VICKS VAPOINHALER) INHA Place 1 puff into both nostrils as needed (for congestion or stuffiness).    [provider]  AYR SALINE NASAL DROPS NA  Place 2 sprays into both nostrils as needed (for congestion).    [provider]  diltiazem (CARDIZEM CD) 120 MG 24 hr capsule Take 1 capsule (120 mg total) by mouth daily. Patient taking differently: Take 120 mg by mouth every evening.  01/07/19   Vivi Barrack, MD  doxazosin (CARDURA) 2 MG tablet Take 1 tablet (2 mg total) by mouth at bedtime. 01/07/19   Vivi Barrack, MD  fluticasone [facility]) 50 MCG/ACT nasal spray Place 2 sprays into both nostrils as needed for allergies or rhinitis. 04/23/18   Marletta Lor, MD  furosemide (LASIX) 20 MG tablet Take 1 tablet (20 mg total) by mouth daily as needed for fluid. Patient not taking: Reported on 01/17/2019 12/17/18   Deboraha Sprang, MD  guaiFENesin-codeine 100-10 MG/5ML syrup Take 5 mLs by mouth 3 (three) times daily as needed for cough. Patient not taking: Reported on 01/17/2019 09/12/18   Vivi Barrack, MD  ipratropium (ATROVENT) 0.06 % nasal spray Place 2 sprays into both nostrils 4 (four) times daily. Patient not taking: Reported on 01/17/2019 08/20/18   Vivi Barrack, MD  losartan (COZAAR) 100 MG tablet Take 1 tablet (100 mg total) by mouth at bedtime. 01/07/19   Vivi Barrack, MD  metoprolol succinate (TOPROL-XL) 100 MG 24 hr tablet Take 1 tablet (100 mg total) by mouth at bedtime. Take with or immediately following a meal. 01/07/19   Vivi Barrack, MD  olmesartan (BENICAR) 40 MG tablet Take 1 tablet (40 mg total) by mouth daily. Patient taking differently: Take 40 mg by mouth at bedtime.  01/15/19   Vivi Barrack, MD  pravastatin (PRAVACHOL) 40 MG tablet Take 1 tablet (40 mg total) by mouth at bedtime. 01/07/19   Vivi Barrack, MD  PRESCRIPTION MEDICATION CPAP: At bedtime    [provider]  sildenafil (VIAGRA) 100 MG tablet Take 100 mg by mouth daily as needed for erectile dysfunction.     [provider]    Family History Family History  Problem Relation Age of Onset  . Stroke Father        Passed away  in his 77s  . Thyroid disease Sister        goiter  . Sudden death Brother        ? Drug use, no autopsy  . Throat cancer Brother   . Colon cancer Neg Hx   . Esophageal cancer Neg Hx   . Rectal cancer Neg Hx   . Stomach cancer Neg Hx     Social History Social History   Tobacco Use  . Smoking status: Former Smoker    Packs/day: 0.50    Years: 8.00    Pack years: 4.00    Types: Cigarettes    Last attempt to quit: 1968    Years since quitting: 52.4  . Smokeless tobacco: Never Used  Substance Use Topics  . Alcohol use: Yes  Comment: 02/20/2017 "couple drinks/month on average"  . Drug use: No     Allergies   Flomax [tamsulosin hcl] and Relpax [eletriptan]   Review of Systems Review of Systems  Constitutional: Negative for chills, fatigue and fever.  HENT: Negative for sore throat.   Respiratory: Negative for shortness of breath.   Cardiovascular: Negative for chest pain.  Gastrointestinal: Negative for abdominal pain, nausea and vomiting.  Genitourinary: Positive for hematuria. Negative for decreased urine volume, difficulty urinating, discharge, dysuria, flank pain, frequency, penile pain, penile swelling, scrotal swelling, testicular pain and urgency.  Skin: Negative for rash.  Neurological: Negative for dizziness, light-headedness and headaches.  Hematological: Does not bruise/bleed easily.     Physical Exam Triage Vital Signs ED Triage Vitals  Enc Vitals Group     BP 03/26/19 1757 (!) 144/99     Pulse Rate 03/26/19 1757 99     Resp 03/26/19 1757 18     Temp 03/26/19 1757 98.3 F (36.8 C)     Temp Source 03/26/19 1757 Oral     SpO2 03/26/19 1757 95 %     Weight --      Height --      Head Circumference --      Peak Flow --      Pain Score 03/26/19 1758 0     Pain Loc --      Pain Edu? --      Excl. in [place]? --    No data found.  Updated Vital Signs BP (!) 144/99 (BP Location: Right Arm)   Pulse 99   Temp 98.3 F (36.8 C) (Oral)   Resp 18    SpO2 95%   Visual Acuity Right Eye Distance:   Left Eye Distance:   Bilateral Distance:    Right Eye Near:   Left Eye Near:    Bilateral Near:     Physical Exam Vitals signs and nursing note reviewed.  Constitutional:      Appearance: He is well-developed.  HENT:     Head: Normocephalic and atraumatic.     Mouth/Throat:     Comments: Oral mucosa pink and moist, no tonsillar enlargement or exudate. Posterior pharynx patent and nonerythematous, no uvula deviation or swelling. Normal phonation.  Eyes:     Conjunctiva/sclera: Conjunctivae normal.  Neck:     Musculoskeletal: Neck supple.  Cardiovascular:     Rate and Rhythm: Normal rate.     Heart sounds: No murmur.     Comments: Irregular Pulmonary:     Effort: Pulmonary effort is normal. No respiratory distress.     Breath sounds: Normal breath sounds.     Comments: Breathing comfortably at rest, CTABL, no wheezing, rales or other adventitious sounds auscultated Abdominal:     Palpations: Abdomen is soft.     Tenderness: There is no abdominal tenderness.     Comments: Soft, nondistended, nontender to light and deep palpation throughout entire abdomen No CVA tenderness  Skin:    General: Skin is warm and dry.     Comments: No bruising or rashes noted  Neurological:     Mental Status: He is alert.      UC Treatments / Results  Labs (all labs ordered are listed, but only abnormal results are displayed) Labs Reviewed  PROTIME-INR - Abnormal; Notable for the following components:      Result Value   Prothrombin Time 16.3 (*)    INR 1.3 (*)    All other components within normal limits  CBC - Abnormal; Notable for the following components:   Platelets 130 (*)    All other components within normal limits  BASIC METABOLIC PANEL - Abnormal; Notable for the following components:   Glucose, Bld 150 (*)    All other components within normal limits  POCT URINALYSIS DIP (DEVICE) - Abnormal; Notable for the following  components:   Glucose, UA 500 (*)    Bilirubin Urine LARGE (*)    Ketones, ur >=160 (*)    Hgb urine dipstick LARGE (*)    Protein, ur >=300 (*)    Nitrite POSITIVE (*)    Leukocytes,Ua LARGE (*)    All other components within normal limits  URINE CULTURE  APTT  PSA    EKG None  Radiology No results found.  Procedures Procedures (including critical care time)  Medications Ordered in UC Medications - No data to display  Initial Impression / Assessment and Plan / UC Course  I have reviewed the triage vital signs and the nursing notes.  Pertinent labs & imaging results that were available during my care of the patient were reviewed by me and considered in my medical decision making (see chart for details).     Patient with gross hematuria for 24 hours, lacking UTI/prostatitis symptoms.  Large amount of blood in urine, urine is dark red, positive for nitrites and leuks, unclear if this is true UTI versus false positive from alteration in color.  Blood work obtained to check PT/PTT, creatinine, BUN, hemoglobin, and per patient request his PSA.  Since he is without UTI symptoms, will defer treatment for this, wait for blood work, culture sent.  Vital signs stable at this time.  Will have push fluids, will call patient with results and provide further treatment/recommendations as needed.  Blood work returned with normal creatinine, hemoglobin, PT/PTT slightly above normal limit, but would not expect need to change Eliquis dose at this time.  Discussed with patient treating for UTI given UA results versus waiting on culture, patient opted to wait for culture.  We will inform of results once this returns.  Discussed strict return precautions. Patient verbalized understanding and is agreeable with plan.  Final Clinical Impressions(s) / UC Diagnoses   Final diagnoses:  Gross hematuria     Discharge Instructions     I will call with the results of your blood work and urine culture   In the mean time please drink plenty of fluids  Follow up with primary/urology if symptoms persisting   ED Prescriptions    None     Controlled Substance Prescriptions [place] Controlled Substance Registry consulted? Not Applicable   Janith Lima, [place] 03/27/19 1004

## 2019-04-01 NOTE — Telephone Encounter (Signed)
Spoke with pt and gave him pre procedural instructions regarding his upcoming A Flutter ablation. See MyChart message with instructions.   Pt is scheduled for pre procedure lab work and COVID screening on 6/12.

## 2019-04-02 DIAGNOSIS — H2513 Age-related nuclear cataract, bilateral: Secondary | ICD-10-CM | POA: Diagnosis not present

## 2019-04-02 DIAGNOSIS — H40013 Open angle with borderline findings, low risk, bilateral: Secondary | ICD-10-CM | POA: Diagnosis not present

## 2019-04-02 DIAGNOSIS — G43909 Migraine, unspecified, not intractable, without status migrainosus: Secondary | ICD-10-CM | POA: Diagnosis not present

## 2019-04-03 DIAGNOSIS — R350 Frequency of micturition: Secondary | ICD-10-CM | POA: Diagnosis not present

## 2019-04-03 DIAGNOSIS — R310 Gross hematuria: Secondary | ICD-10-CM | POA: Diagnosis not present

## 2019-04-03 DIAGNOSIS — R351 Nocturia: Secondary | ICD-10-CM | POA: Diagnosis not present

## 2019-04-10 ENCOUNTER — Other Ambulatory Visit: Payer: Self-pay | Admitting: Family Medicine

## 2019-04-10 NOTE — Telephone Encounter (Signed)
Last OV 08/20/18 Last refill 01/15/19 #30/3 Next OV 08/26/19

## 2019-04-15 DIAGNOSIS — R310 Gross hematuria: Secondary | ICD-10-CM | POA: Diagnosis not present

## 2019-04-15 DIAGNOSIS — K8020 Calculus of gallbladder without cholecystitis without obstruction: Secondary | ICD-10-CM | POA: Diagnosis not present

## 2019-04-15 DIAGNOSIS — R319 Hematuria, unspecified: Secondary | ICD-10-CM | POA: Diagnosis not present

## 2019-04-16 ENCOUNTER — Telehealth: Payer: Self-pay | Admitting: *Deleted

## 2019-04-16 NOTE — Telephone Encounter (Signed)
    COVID-19 Pre-Screening Questions:  . In the past 7 to 10 days have you had a cough,  shortness of breath, headache, congestion, fever (100 or greater) body aches, chills, sore throat, or sudden loss of taste or sense of smell? . Have you been around anyone with known Covid 19. . Have you been around anyone who is awaiting Covid 19 test results in the past 7 to 10 days? . Have you been around anyone who has been exposed to Covid 19, or has mentioned symptoms of Covid 19 within the past 7 to 10 days?  If you have any concerns/questions about symptoms patients report during screening (either on the phone or at threshold). Contact the provider seeing the patient or DOD for further guidance.  If neither are available contact a member of the leadership team.           Contacted patient via phone call. No to all Covid 19 questions . Has a mask for  Lab visit.kb 

## 2019-04-18 ENCOUNTER — Inpatient Hospital Stay (HOSPITAL_COMMUNITY): Admission: RE | Admit: 2019-04-18 | Payer: Medicare HMO | Source: Ambulatory Visit

## 2019-04-18 ENCOUNTER — Other Ambulatory Visit: Payer: Self-pay

## 2019-04-18 ENCOUNTER — Other Ambulatory Visit: Payer: Medicare Other | Admitting: *Deleted

## 2019-04-18 DIAGNOSIS — I4892 Unspecified atrial flutter: Secondary | ICD-10-CM | POA: Diagnosis not present

## 2019-04-19 ENCOUNTER — Other Ambulatory Visit (HOSPITAL_COMMUNITY)
Admission: RE | Admit: 2019-04-19 | Discharge: 2019-04-19 | Disposition: A | Discharge status: Home / Self Care | Payer: Medicare HMO | Source: Ambulatory Visit | Attending: Internal Medicine | Admitting: Internal Medicine

## 2019-04-19 DIAGNOSIS — Z1159 Encounter for screening for other viral diseases: Secondary | ICD-10-CM | POA: Diagnosis not present

## 2019-04-19 LAB — CBC
Hematocrit: 40.1 % (ref 37.5–51.0)
Hemoglobin: 13.2 g/dL (ref 13.0–17.7)
MCH: 29.9 pg (ref 26.6–33.0)
MCHC: 32.9 g/dL (ref 31.5–35.7)
MCV: 91 fL (ref 79–97)
Platelets: 128 10*3/uL — ABNORMAL LOW (ref 150–450)
RBC: 4.42 x10E6/uL (ref 4.14–5.80)
RDW: 13 % (ref 11.6–15.4)
WBC: 5.9 10*3/uL (ref 3.4–10.8)

## 2019-04-19 LAB — BASIC METABOLIC PANEL
BUN/Creatinine Ratio: 14 (ref 10–24)
BUN: 14 mg/dL (ref 8–27)
CO2: 23 mmol/L (ref 20–29)
Calcium: 9.2 mg/dL (ref 8.6–10.2)
Chloride: 104 mmol/L (ref 96–106)
Creatinine, Ser: 1 mg/dL (ref 0.76–1.27)
GFR calc Af Amer: 87 mL/min/{1.73_m2} (ref >59)
GFR calc non Af Amer: 75 mL/min/{1.73_m2} (ref >59)
Glucose: 77 mg/dL (ref 65–99)
Potassium: 4.5 mmol/L (ref 3.5–5.2)
Sodium: 142 mmol/L (ref 134–144)

## 2019-04-21 LAB — NOVEL CORONAVIRUS, NAA (HOSP ORDER, SEND-OUT TO REF LAB; TAT 18-24 HRS): SARS-CoV-2, NAA: NOT DETECTED

## 2019-04-22 ENCOUNTER — Telehealth: Payer: Self-pay | Admitting: Internal Medicine

## 2019-04-22 NOTE — Telephone Encounter (Signed)
Reviewed pt's arrival time as 6:30am at short stay. We reviewed lifting, squatting, and bathing restrictions after procedure. Pt has verbalized understanding and had no additional questions.

## 2019-04-22 NOTE — Telephone Encounter (Signed)
New Message:     Pt wants to verify the time of his appointment tomorrow for his Ablation. I told him  8:30, he said he would like for the nurse to verify the time please. He also needs to verify the arrival time.

## 2019-04-23 ENCOUNTER — Ambulatory Visit (HOSPITAL_COMMUNITY): Payer: Medicare HMO | Admitting: Anesthesiology

## 2019-04-23 ENCOUNTER — Other Ambulatory Visit: Payer: Self-pay

## 2019-04-23 ENCOUNTER — Ambulatory Visit (HOSPITAL_COMMUNITY)
Admission: RE | Admit: 2019-04-23 | Discharge: 2019-04-23 | Disposition: A | Discharge status: Home / Self Care | Payer: Medicare HMO | Attending: Internal Medicine | Admitting: Internal Medicine

## 2019-04-23 ENCOUNTER — Encounter (HOSPITAL_COMMUNITY)
Admission: RE | Disposition: A | Discharge status: Home / Self Care | Payer: Self-pay | Source: Home / Self Care | Attending: Internal Medicine

## 2019-04-23 DIAGNOSIS — G4733 Obstructive sleep apnea (adult) (pediatric): Secondary | ICD-10-CM | POA: Diagnosis not present

## 2019-04-23 DIAGNOSIS — E669 Obesity, unspecified: Secondary | ICD-10-CM | POA: Diagnosis not present

## 2019-04-23 DIAGNOSIS — Z87891 Personal history of nicotine dependence: Secondary | ICD-10-CM | POA: Diagnosis not present

## 2019-04-23 DIAGNOSIS — I483 Typical atrial flutter: Secondary | ICD-10-CM | POA: Diagnosis not present

## 2019-04-23 DIAGNOSIS — I4891 Unspecified atrial fibrillation: Secondary | ICD-10-CM | POA: Diagnosis not present

## 2019-04-23 DIAGNOSIS — I209 Angina pectoris, unspecified: Secondary | ICD-10-CM | POA: Diagnosis not present

## 2019-04-23 DIAGNOSIS — Z6841 Body Mass Index (BMI) 40.0 and over, adult: Secondary | ICD-10-CM | POA: Insufficient documentation

## 2019-04-23 DIAGNOSIS — E7800 Pure hypercholesterolemia, unspecified: Secondary | ICD-10-CM | POA: Insufficient documentation

## 2019-04-23 DIAGNOSIS — Z7901 Long term (current) use of anticoagulants: Secondary | ICD-10-CM | POA: Diagnosis not present

## 2019-04-23 DIAGNOSIS — I110 Hypertensive heart disease with heart failure: Secondary | ICD-10-CM | POA: Insufficient documentation

## 2019-04-23 DIAGNOSIS — Z8673 Personal history of transient ischemic attack (TIA), and cerebral infarction without residual deficits: Secondary | ICD-10-CM | POA: Insufficient documentation

## 2019-04-23 DIAGNOSIS — I639 Cerebral infarction, unspecified: Secondary | ICD-10-CM | POA: Diagnosis not present

## 2019-04-23 DIAGNOSIS — M1990 Unspecified osteoarthritis, unspecified site: Secondary | ICD-10-CM | POA: Insufficient documentation

## 2019-04-23 DIAGNOSIS — R319 Hematuria, unspecified: Secondary | ICD-10-CM | POA: Insufficient documentation

## 2019-04-23 DIAGNOSIS — E0590 Thyrotoxicosis, unspecified without thyrotoxic crisis or storm: Secondary | ICD-10-CM | POA: Diagnosis not present

## 2019-04-23 DIAGNOSIS — I509 Heart failure, unspecified: Secondary | ICD-10-CM | POA: Diagnosis not present

## 2019-04-23 DIAGNOSIS — Z79899 Other long term (current) drug therapy: Secondary | ICD-10-CM | POA: Insufficient documentation

## 2019-04-23 DIAGNOSIS — I10 Essential (primary) hypertension: Secondary | ICD-10-CM | POA: Diagnosis not present

## 2019-04-23 HISTORY — PX: A-FLUTTER ABLATION: EP1230

## 2019-04-23 SURGERY — A-FLUTTER ABLATION
Anesthesia: General

## 2019-04-23 MED ORDER — LIDOCAINE 2% (20 MG/ML) 5 ML SYRINGE
INTRAMUSCULAR | Status: DC | PRN
  Administered 2019-04-23: 80 mg via INTRAVENOUS

## 2019-04-23 MED ORDER — SUCCINYLCHOLINE CHLORIDE 200 MG/10ML IV SOSY
PREFILLED_SYRINGE | INTRAVENOUS | Status: DC | PRN
  Administered 2019-04-23: 160 mg via INTRAVENOUS

## 2019-04-23 MED ORDER — ROCURONIUM BROMIDE 10 MG/ML (PF) SYRINGE
PREFILLED_SYRINGE | INTRAVENOUS | Status: DC | PRN
  Administered 2019-04-23: 50 mg via INTRAVENOUS

## 2019-04-23 MED ORDER — HEPARIN (PORCINE) IN NACL 1000-0.9 UT/500ML-% IV SOLN
INTRAVENOUS | Status: AC
  Filled 2019-04-23: qty 500

## 2019-04-23 MED ORDER — SODIUM CHLORIDE 0.9 % IV SOLN: 250.0000 mL | INTRAVENOUS | Status: DC | PRN

## 2019-04-23 MED ORDER — EPHEDRINE SULFATE 50 MG/ML IJ SOLN
INTRAMUSCULAR | Status: DC | PRN
  Administered 2019-04-23: 5 mg via INTRAVENOUS
  Administered 2019-04-23: 10 mg via INTRAVENOUS

## 2019-04-23 MED ORDER — SODIUM CHLORIDE 0.9% FLUSH: 3.0000 mL | Freq: Two times a day (BID) | INTRAVENOUS | Status: DC

## 2019-04-23 MED ORDER — ACETAMINOPHEN 325 MG PO TABS
ORAL_TABLET | ORAL | Status: AC
  Filled 2019-04-23: qty 2

## 2019-04-23 MED ORDER — DEXAMETHASONE SODIUM PHOSPHATE 10 MG/ML IJ SOLN
INTRAMUSCULAR | Status: DC | PRN
  Administered 2019-04-23: 4 mg via INTRAVENOUS

## 2019-04-23 MED ORDER — BUPIVACAINE HCL (PF) 0.25 % IJ SOLN
INTRAMUSCULAR | Status: DC | PRN
  Administered 2019-04-23: 40 mL

## 2019-04-23 MED ORDER — PROPOFOL 10 MG/ML IV BOLUS
INTRAVENOUS | Status: DC | PRN
  Administered 2019-04-23: 130 mg via INTRAVENOUS

## 2019-04-23 MED ORDER — HEPARIN (PORCINE) IN NACL 1000-0.9 UT/500ML-% IV SOLN
INTRAVENOUS | Status: DC | PRN
  Administered 2019-04-23 (×2): 500 mL

## 2019-04-23 MED ORDER — BUPIVACAINE HCL (PF) 0.25 % IJ SOLN
INTRAMUSCULAR | Status: AC
  Filled 2019-04-23: qty 60

## 2019-04-23 MED ORDER — SODIUM CHLORIDE 0.9% FLUSH: 3.0000 mL | INTRAVENOUS | Status: DC | PRN

## 2019-04-23 MED ORDER — ACETAMINOPHEN 325 MG PO TABS
650.0000 mg | ORAL_TABLET | ORAL | Status: DC | PRN
  Administered 2019-04-23: 650 mg via ORAL

## 2019-04-23 MED ORDER — SUGAMMADEX SODIUM 200 MG/2ML IV SOLN
INTRAVENOUS | Status: DC | PRN
  Administered 2019-04-23: 240.4 mg via INTRAVENOUS

## 2019-04-23 MED ORDER — ONDANSETRON HCL 4 MG/2ML IJ SOLN: 4.0000 mg | Freq: Four times a day (QID) | INTRAMUSCULAR | Status: DC | PRN

## 2019-04-23 MED ORDER — SODIUM CHLORIDE 0.9 % IV SOLN
INTRAVENOUS | Status: DC
  Administered 2019-04-23: 08:00:00 via INTRAVENOUS

## 2019-04-23 MED ORDER — ONDANSETRON HCL 4 MG/2ML IJ SOLN
INTRAMUSCULAR | Status: DC | PRN
  Administered 2019-04-23: 4 mg via INTRAVENOUS

## 2019-04-23 MED ORDER — SODIUM CHLORIDE 0.9 % IV SOLN
INTRAVENOUS | Status: DC | PRN
  Administered 2019-04-23: 20 ug/min via INTRAVENOUS

## 2019-04-23 MED ORDER — FENTANYL CITRATE (PF) 250 MCG/5ML IJ SOLN
INTRAMUSCULAR | Status: DC | PRN
  Administered 2019-04-23: 100 ug via INTRAVENOUS

## 2019-04-23 SURGICAL SUPPLY — 14 items
BAG SNAP BAND KOVER 36X36 (MISCELLANEOUS) ×2 IMPLANT
BLANKET WARM UNDERBOD FULL ACC (MISCELLANEOUS) ×2 IMPLANT
CATH BLAZERPRIME XP LG CV 10MM (ABLATOR) ×2 IMPLANT
CATH DUODECA HALO/ISMUS 7FR (CATHETERS) ×2 IMPLANT
CATH OCTAPOLOR 6F 125CM 2-5-2 (CATHETERS) ×2 IMPLANT
CATH QUAD COURNAND 5FR REPROC (CATHETERS) ×2 IMPLANT
PACK EP LATEX FREE (CUSTOM PROCEDURE TRAY) ×1
PACK EP LF (CUSTOM PROCEDURE TRAY) ×1 IMPLANT
PAD PRO RADIOLUCENT 2001M-C (PAD) ×2 IMPLANT
SHEATH ATRIAL FLUTTER SAFL 8F (SHEATH) ×2 IMPLANT
SHEATH PINNACLE 6F 10CM (SHEATH) ×2 IMPLANT
SHEATH PINNACLE 7F 10CM (SHEATH) ×2 IMPLANT
SHEATH PINNACLE 8F 10CM (SHEATH) ×4 IMPLANT
SHIELD RADPAD SCOOP 12X17 (MISCELLANEOUS) ×2 IMPLANT

## 2019-04-23 NOTE — Progress Notes (Signed)
CTSP because of "atrial fib"  Review of ECG >> sinus and review of telemetry>> sinus with double counting   No afib  Results reviewed DCCV of flutter >> isthmus block Arrhythmia mapping septal flutter  Continue apixoban   PE  Groins ok VSS  Ok for discharge   Reviewed recommendations and instructions with patient and his wife ( by phone)

## 2019-04-23 NOTE — Progress Notes (Signed)
D/c instructions given to patient and wife.  All questions answered and patient and family verbalized understanding

## 2019-04-23 NOTE — Anesthesia Postprocedure Evaluation (Signed)
Anesthesia Post Note  Patient: Gordon Phillips  Procedure(s) Performed: A-FLUTTER ABLATION (N/A )     Patient location during evaluation: PACU Anesthesia Type: General Level of consciousness: awake and alert Pain management: pain level controlled Vital Signs Assessment: post-procedure vital signs reviewed and stable Respiratory status: spontaneous breathing, nonlabored ventilation and respiratory function stable Cardiovascular status: blood pressure returned to baseline and stable Postop Assessment: no apparent nausea or vomiting Anesthetic complications: no    Last Vitals:  Vitals:   04/23/19 1049 04/23/19 1115  BP: 132/67   Pulse: 67 62  Resp:    Temp: (!) 36.3 C (!) 36.4 C  SpO2:      Last Pain:  Vitals:   04/23/19 1115  TempSrc: Temporal  PainSc: 0-No pain                 Lynda Rainwater

## 2019-04-23 NOTE — Discharge Instructions (Signed)
Cardiac Ablation, Care After This sheet gives you information about how to care for yourself after your procedure. Your health care provider may also give you more specific instructions. If you have problems or questions, contact your health care provider. What can I expect after the procedure? After the procedure, it is common to have:  Bruising around your puncture site.  Tenderness around your puncture site.  Skipped heartbeats.  Tiredness (fatigue). Follow these instructions at home: Puncture site care   Follow instructions from your health care provider about how to take care of your puncture site. Make sure you: ? Wash your hands with soap and water before you change your bandage (dressing). If soap and water are not available, use hand sanitizer. ? Change your dressing as told by your health care provider. ? Leave stitches (sutures), skin glue, or adhesive strips in place. These skin closures may need to stay in place for up to 2 weeks. If adhesive strip edges start to loosen and curl up, you may trim the loose edges. Do not remove adhesive strips completely unless your health care provider tells you to do that.  Check your puncture site every day for signs of infection. Check for: ? Redness, swelling, or pain. ? Fluid or blood. If your puncture site starts to bleed, lie down on your back, apply firm pressure to the area, and contact your health care provider. ? Warmth. ? Pus or a bad smell. Driving  Ask your health care provider when it is safe for you to drive again after the procedure.  Do not drive or use heavy machinery while taking prescription pain medicine.  Do not drive for 24 hours if you were given a medicine to help you relax (sedative) during your procedure. Activity  Avoid activities that take a lot of effort for at least 3 days after your procedure.  Do not lift anything that is heavier than 10 lb (4.5 kg), or the limit that you are told, until your health  care provider says that it is safe.  Return to your normal activities as told by your health care provider. Ask your health care provider what activities are safe for you. General instructions  Take over-the-counter and prescription medicines only as told by your health care provider.  Do not use any products that contain nicotine or tobacco, such as cigarettes and e-cigarettes. If you need help quitting, ask your health care provider.  Do not take baths, swim, or use a hot tub until your health care provider approves.  Do not drink alcohol for 24 hours after your procedure.  Keep all follow-up visits as told by your health care provider. This is important. Contact a health care provider if:  You have redness, mild swelling, or pain around your puncture site.  You have fluid or blood coming from your puncture site that stops after applying firm pressure to the area.  Your puncture site feels warm to the touch.  You have pus or a bad smell coming from your puncture site.  You have a fever.  You have chest pain or discomfort that spreads to your neck, jaw, or arm.  You are sweating a lot.  You feel nauseous.  You have a fast or irregular heartbeat.  You have shortness of breath.  You are dizzy or light-headed and feel the need to lie down.  You have pain or numbness in the arm or leg closest to your puncture site. Get help right away if:  Your puncture  puncture site.  Get help right away if:  Your puncture site suddenly swells.  Your puncture site is bleeding and the bleeding does not stop after applying firm pressure to the area.  These symptoms may represent a serious problem that is an emergency. Do not wait to see if the symptoms will go away. Get medical help right away. Call your local emergency services (911 in the [place] Do not drive yourself to the hospital.  Summary  After the procedure, it is normal to have bruising and tenderness at the puncture site in your groin, neck, or forearm.  Check your puncture site every day for signs of infection.  Get help right away if your puncture site is  bleeding and the bleeding does not stop after applying firm pressure to the area. This is a medical emergency.  This information is not intended to replace advice given to you by your health care provider. Make sure you discuss any questions you have with your health care provider.  Document Released: 02/01/2017 Document Revised: 02/01/2017 Document Reviewed: 02/01/2017  Elsevier Interactive Patient Education  2019 [employer]

## 2019-04-23 NOTE — Anesthesia Preprocedure Evaluation (Addendum)
Anesthesia Evaluation  Patient identified by MRN, date of birth, ID band Patient awake    Reviewed: Allergy & Precautions, H&P , NPO status , Patient's Chart, lab work & pertinent test results  Airway Mallampati: II  TM Distance: >3 FB Neck ROM: full    Dental no notable dental hx.    Pulmonary sleep apnea , former smoker,    Pulmonary exam normal breath sounds clear to auscultation       Cardiovascular hypertension, + angina Normal cardiovascular exam+ dysrhythmias Atrial Fibrillation  Rhythm:regular Rate:Normal     Neuro/Psych CVA    GI/Hepatic   Endo/Other  Hyperthyroidism obese  Renal/GU      Musculoskeletal  (+) Arthritis ,   Abdominal (+) + obese,   Peds  Hematology   Anesthesia Other Findings   Reproductive/Obstetrics                            Anesthesia Physical  Anesthesia Plan  ASA: III  Anesthesia Plan: General   Post-op Pain Management:    Induction: Intravenous  PONV Risk Score and Plan: 2 and Ondansetron, Treatment may vary due to age or medical condition, Midazolam and Dexamethasone  Airway Management Planned: Oral ETT  Additional Equipment:   Intra-op Plan:   Post-operative Plan: Extubation in OR  Informed Consent: I have reviewed the patients History and Physical, chart, labs and discussed the procedure including the risks, benefits and alternatives for the proposed anesthesia with the patient or authorized representative who has indicated his/her understanding and acceptance.       Plan Discussed with: CRNA, Anesthesiologist and Surgeon  Anesthesia Plan Comments:       Anesthesia Quick Evaluation

## 2019-04-23 NOTE — H&P (Signed)
Patient Care Team: Vivi Barrack, MD as PCP - General (Family Medicine) Buford Dresser, MD as PCP - Cardiology (Cardiology)   HPI  Gordon Phillips is a 73 y.o. male Admitted for catheter ablation of atrial flutter   Significant daytime somnolence; treated sleep apnea with CPAP card followed  DATE TEST EF   4/18 MYOVIEW 50 No Ischemia  10/19 Echo   55-60  % LAE (51/2.0/35)  10/19 TEE  45 %         Date Cr K Hgb  10/19 1.12 4.3 12.5         Continues with dyspnea and some fluttering  Gross hematuria   Records and Results Reviewed   Past Medical History:  Diagnosis Date  . ALLERGIC RHINITIS 01/25/2010  . Arthritis    "neck, back, shoulders," (02/20/2017)  . Chronic lower back pain   . High cholesterol   . Hx of cardiovascular stress test    a. Lexiscan Myoview 02/06/13:  EF 59%, no ischemia or scar  . Hx of echocardiogram    a.  Echo 02/03/13:  Mild LVH, mild focal basal septal hypertrophy, EF 50-55%, Gr 1 DD, mild LAE  . HYPERTENSION 01/25/2010  . HYPERTHYROIDISM 01/25/2010   S/P radioactive iodine "back in the 1980s" (02/20/2017)  . Impaired glucose tolerance   . 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 Obesity   . OSA on CPAP    Uses CPAP nightly  . Pericarditis 1980's  . 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  Marland Kitchen Vitiligo     Past Surgical History:  Procedure Laterality Date  . CARDIOVERSION N/A 08/14/2018   Procedure: CARDIOVERSION;  Surgeon: [name], MD;  Location: [facility] ENDOSCOPY;  Service: Cardiovascular;  Laterality: N/A;  . INGUINAL HERNIA REPAIR Bilateral   . LUMBAR LAMINECTOMY    . TEE WITHOUT CARDIOVERSION N/A 08/14/2018   Procedure: TRANSESOPHAGEAL ECHOCARDIOGRAM (TEE);  Surgeon: [name], MD;  Location: [facility] ENDOSCOPY;  Service: Cardiovascular;  Laterality: N/A;  . UMBILICAL HERNIA REPAIR      No current facility-administered medications for this  encounter.     Allergies  Allergen Reactions  . Flomax [Tamsulosin Hcl] Itching  . Relpax [Eletriptan] Other (See Comments) and Hypertension    Chest pain and sweating  . Other Palpitations    Any steroids cause heart palpitations       Social History   Tobacco Use  . Smoking status: Former Smoker    Packs/day: 0.50    Years: 8.00    Pack years: 4.00    Types: Cigarettes    Quit date: 1968    Years since quitting: 52.4  . Smokeless tobacco: Never Used  Substance Use Topics  . Alcohol use: Yes    Comment: 02/20/2017 "couple drinks/month on average"  . Drug use: No     Family History  Problem Relation Age of Onset  . Stroke Father        Passed away in his 24s  . Thyroid disease Sister        goiter  . Sudden death Brother        ? Drug use, no autopsy  . Throat cancer Brother   . Colon cancer Neg Hx   . Esophageal cancer Neg Hx   . Rectal cancer Neg Hx   . Stomach cancer Neg Hx      Current Meds  Medication Sig  .  acetaminophen (TYLENOL) 650 MG CR tablet Take 1,300 mg by mouth daily.  Marland Kitchen apixaban (ELIQUIS) 5 MG TABS tablet Take 1 tablet (5 mg total) by mouth 2 (two) times daily.  . Aromatic Inhalants (VICKS VAPOINHALER) INHA Place 1 puff into both nostrils as needed (for congestion or stuffiness).  Skipper Cliche SALINE NASAL DROPS NA Place 2 sprays into both nostrils as needed (for congestion).  Marland Kitchen diltiazem (CARDIZEM CD) 120 MG 24 hr capsule Take 1 capsule (120 mg total) by mouth daily. (Patient taking differently: Take 120 mg by mouth every evening. )  . doxazosin (CARDURA) 2 MG tablet Take 1 tablet (2 mg total) by mouth at bedtime.  . metoprolol succinate (TOPROL-XL) 100 MG 24 hr tablet Take 1 tablet (100 mg total) by mouth at bedtime. Take with or immediately following a meal.  . olmesartan (BENICAR) 40 MG tablet TAKE 1 TABLET BY MOUTH EVERY DAY  . pravastatin (PRAVACHOL) 40 MG tablet Take 1 tablet (40 mg total) by mouth at bedtime.     Review of Systems negative  except from HPI and PMH  Physical Exam BP 135/85   Pulse 73   Temp 97.7 F (36.5 C)   Resp 18   Ht 6\' 2"  (1.88 m)   Wt (!) 265 kg   SpO2 97%   BMI 75.01 kg/m  Well developed and well nourished in no acute distress HENT normal E scleral and icterus clear Neck  Clear to ausculation Regular rate and rhythm,  Soft   No clubbing cyanosis  Edema Alert and oriented, grossly normal motor and sensory function Skin Warm and Dry    Assessment and  Plan  Atrial flutter-recurrent  HFpEF  Obesity  Sleep apnea-treated  Hematuria-- gross  W/u undergoing  TIA/CVA  For catheter ablation  Reviewed risks and benefits Plan long term anticoagulation   GU evaluation ongoing

## 2019-04-23 NOTE — Transfer of Care (Signed)
Immediate Anesthesia Transfer of Care Note  Patient: Gordon Phillips  Procedure(s) Performed: A-FLUTTER ABLATION (N/A )  Patient Location: PACU and Cath Lab  Anesthesia Type:General  Level of Consciousness: awake, oriented and patient cooperative  Airway & Oxygen Therapy: Patient Spontanous Breathing and Patient connected to nasal cannula oxygen  Post-op Assessment: Report given to RN and Post -op Vital signs reviewed and stable  Post vital signs: Reviewed and stable  Last Vitals:  Vitals Value Taken Time  BP    Temp    Pulse    Resp    SpO2      Last Pain:  Vitals:   04/23/19 0738  PainSc: 0-No pain         Complications: No apparent anesthesia complications

## 2019-04-23 NOTE — Progress Notes (Signed)
No bleeding or swelling noted after ambulation 

## 2019-04-23 NOTE — Anesthesia Procedure Notes (Signed)
Procedure Name: Intubation Date/Time: 04/23/2019 8:43 AM Performed by: Renato Shin, CRNA Pre-anesthesia Checklist: Patient identified, Emergency Drugs available, Suction available and Patient being monitored Patient Re-evaluated:Patient Re-evaluated prior to induction Oxygen Delivery Method: Circle system utilized Preoxygenation: Pre-oxygenation with 100% oxygen Induction Type: IV induction and Rapid sequence Laryngoscope Size: Miller and 3 Grade View: Grade II Tube type: Oral Tube size: 7.5 mm Number of attempts: 1 Airway Equipment and Method: Stylet and Oral airway Placement Confirmation: ETT inserted through vocal cords under direct vision,  positive ETCO2 and breath sounds checked- equal and bilateral Secured at: 22 cm Tube secured with: Tape Dental Injury: Teeth and Oropharynx as per pre-operative assessment

## 2019-04-23 NOTE — Progress Notes (Signed)
    7,8 Fr. In R F/V and 8, 6 Fr. In L F/V sheaths were pulled, and manual pressure was held until hemosttsis was obtained. Sterile gauze was applied at both sites. Both groins are soft and non tender. R and L DP was palpable before and after the sheath pull.   Bed rest started at 12030 X 6 hr. Instructions were given to patient about his bed rest.   HR 66 SR 1-st degree AV block  BP 130/78  sPO2 95 % on R/A

## 2019-04-24 ENCOUNTER — Encounter (HOSPITAL_COMMUNITY): Payer: Self-pay | Admitting: Internal Medicine

## 2019-05-07 ENCOUNTER — Encounter: Payer: Self-pay | Admitting: Cardiology

## 2019-05-07 ENCOUNTER — Ambulatory Visit: Payer: Medicare HMO | Admitting: Cardiology

## 2019-05-07 ENCOUNTER — Other Ambulatory Visit: Payer: Self-pay

## 2019-05-07 VITALS — BP 128/76 | HR 86 | Wt 275.8 lb

## 2019-05-07 DIAGNOSIS — G4733 Obstructive sleep apnea (adult) (pediatric): Secondary | ICD-10-CM

## 2019-05-07 DIAGNOSIS — I4892 Unspecified atrial flutter: Secondary | ICD-10-CM | POA: Diagnosis not present

## 2019-05-07 DIAGNOSIS — I10 Essential (primary) hypertension: Secondary | ICD-10-CM | POA: Diagnosis not present

## 2019-05-07 DIAGNOSIS — Z7189 Other specified counseling: Secondary | ICD-10-CM

## 2019-05-07 NOTE — Progress Notes (Signed)
Cardiology Office Note:    Date:  05/07/2019   ID:  Gordon Phillips, DOB Jan 04, 1946, MRN [id number]  PCP:  Vivi Barrack, MD  Cardiologist:  Buford Dresser, MD PhD EP: Dr. Caryl Comes  Referring MD: Vivi Barrack, MD   CC: follow up  History of Present Illness:    Gordon Phillips is a 73 y.o. male with a hx of atrial flutter, MGUS, back pain who is seen for follow up of his atrial flutter.  Cardiac history: 08/12/18 presented with new onset atrial flutter (initially called as a CODE STEMI). During his hospitalization, he underwent TEE/CV with conversion to NSR. He was seen for EP follow up post hospitalization to discuss flutter ablation on 09/17/18, but he was found to be in rate controlled atrial fibrillation at the time. Dofetilide load vs. Ablation discussed. After seeing Dr. Caryl Comes /2020, plan was for ablation of the flutter, but this was put on hold due to coronavirus. He underwent flutter ablation 04/23/19 by Dr. Caryl Comes.  Today: Being worked up by [facility] for gross hematuria. All workup thus far normal per report, one more test and then he is cleared. No recent episodes. Otherwise no bleeding issues.  Doing well post ablation. No issues at access sites. Feeling well. No palpitations. Reviewed recommendations for gradual return to activity. He is focused on weight loss.   Denies chest pain, shortness of breath at rest or with normal exertion. No PND, orthopnea, LE edema or unexpected weight gain. No syncope or palpitations.  Past Medical History:  Diagnosis Date  . ALLERGIC RHINITIS 01/25/2010  . Arthritis    "neck, back, shoulders," (02/20/2017)  . Chronic lower back pain   . High cholesterol   . Hx of cardiovascular stress test    a. Lexiscan Myoview 02/06/13:  EF 59%, no ischemia or scar  . Hx of echocardiogram    a.  Echo 02/03/13:  Mild LVH, mild focal basal septal hypertrophy, EF 50-55%, Gr 1 DD, mild LAE  . HYPERTENSION 01/25/2010  . HYPERTHYROIDISM 01/25/2010   S/P  radioactive iodine "back in the 1980s" (02/20/2017)  . Impaired glucose tolerance   . 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 Obesity   . OSA on CPAP    Uses CPAP nightly  . Pericarditis 1980's  . 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  Marland Kitchen Vitiligo     Past Surgical History:  Procedure Laterality Date  . A-FLUTTER ABLATION N/A 04/23/2019   Procedure: A-FLUTTER ABLATION;  Surgeon: Deboraha Sprang, MD;  Location: [place] CV LAB;  Service: Cardiovascular;  Laterality: N/A;  . CARDIOVERSION N/A 08/14/2018   Procedure: CARDIOVERSION;  Surgeon: [name], MD;  Location: [facility] ENDOSCOPY;  Service: Cardiovascular;  Laterality: N/A;  . INGUINAL HERNIA REPAIR Bilateral   . LUMBAR LAMINECTOMY    . TEE WITHOUT CARDIOVERSION N/A 08/14/2018   Procedure: TRANSESOPHAGEAL ECHOCARDIOGRAM (TEE);  Surgeon: [name], MD;  Location: [facility] ENDOSCOPY;  Service: Cardiovascular;  Laterality: N/A;  . UMBILICAL HERNIA REPAIR      Current Medications: Current Outpatient Medications on File Prior to Visit  Medication Sig  . acetaminophen (TYLENOL) 650 MG CR tablet Take 1,300 mg by mouth daily.  Marland Kitchen apixaban (ELIQUIS) 5 MG TABS tablet Take 1 tablet (5 mg total) by mouth 2 (two) times daily.  . Aromatic Inhalants (VICKS VAPOINHALER) INHA Place 1 puff into both nostrils as needed (for congestion or stuffiness).  Marland Kitchen  AYR SALINE NASAL DROPS NA Place 2 sprays into both nostrils as needed (for congestion).  Marland Kitchen diltiazem (CARDIZEM CD) 120 MG 24 hr capsule Take 1 capsule (120 mg total) by mouth daily. (Patient taking differently: Take 120 mg by mouth every evening. )  . doxazosin (CARDURA) 2 MG tablet Take 1 tablet (2 mg total) by mouth at bedtime.  . metoprolol succinate (TOPROL-XL) 100 MG 24 hr tablet Take 1 tablet (100 mg total) by mouth at bedtime. Take with or immediately following a meal.  . olmesartan (BENICAR) 40 MG tablet TAKE 1 TABLET BY MOUTH EVERY  DAY  . pravastatin (PRAVACHOL) 40 MG tablet Take 1 tablet (40 mg total) by mouth at bedtime.  Marland Kitchen PRESCRIPTION MEDICATION CPAP: At bedtime  . sildenafil (VIAGRA) 100 MG tablet Take 100 mg by mouth daily as needed for erectile dysfunction.    No current facility-administered medications on file prior to visit.      Allergies:   Flomax [tamsulosin hcl], Relpax [eletriptan], and Other   Social History   Socioeconomic History  . Marital status: Married    Spouse name: Not on file  . Number of children: 2  . Years of education: Not on file  . Highest education level: Not on file  Occupational History  . Occupation: Geographical information systems officer  . Financial resource strain: Not on file  . Food insecurity    Worry: Not on file    Inability: Not on file  . Transportation needs    Medical: Not on file    Non-medical: Not on file  Tobacco Use  . Smoking status: Former Smoker    Packs/day: 0.50    Years: 8.00    Pack years: 4.00    Types: Cigarettes    Quit date: 1968    Years since quitting: 52.5  . Smokeless tobacco: Never Used  Substance and Sexual Activity  . Alcohol use: Yes    Comment: 02/20/2017 "couple drinks/month on average"  . Drug use: No  . Sexual activity: Yes  Lifestyle  . Physical activity    Days per week: Not on file    Minutes per session: Not on file  . Stress: Not on file  Relationships  . Social Herbalist on phone: Not on file    Gets together: Not on file    Attends religious service: Not on file    Active member of club or organization: Not on file    Attends meetings of clubs or organizations: Not on file    Relationship status: Not on file  Other Topics Concern  . Not on file  Social History Narrative  . Not on file     Family History: The patient's family history includes Stroke in his father; Sudden death in his brother; Throat cancer in his brother; Thyroid disease in his sister. There is no history of Colon cancer, Esophageal cancer,  Rectal cancer, or Stomach cancer.  ROS:   Please see the history of present illness.  Additional pertinent ROS: Constitutional: Negative for chills, fever, night sweats, unintentional weight loss  HENT: Negative for ear pain and hearing loss.   Eyes: Negative for loss of vision and eye pain.  Respiratory: Negative for cough, sputum, wheezing.   Cardiovascular: See HPI. Gastrointestinal: Negative for abdominal pain, melena, and hematochezia.  Genitourinary: Negative for dysuria, positive for episode of gross hematuria.  Musculoskeletal: Negative for falls and myalgias.  Skin: Negative for itching and rash.  Neurological: Negative for focal  weakness, focal sensory changes and loss of consciousness.  Endo/Heme/Allergies: Does bruise/bleed easily.    EKGs/Labs/Other Studies Reviewed:    The following studies were reviewed today:  TEE 08/14/18 Study Conclusions - Left ventricle: The cavity size was normal. Wall thickness was   normal. Systolic function was mildly to moderately reduced. The   estimated ejection fraction was in the range of 40% to 45%. - Left atrium: The atrium was dilated. No evidence of thrombus in   the appendage. - Right ventricle: The cavity size was dilated. Wall thickness was   normal. - Right atrium: The atrium was dilated. No evidence of thrombus in   the atrial cavity or appendage. - Atrial septum: No defect or patent foramen ovale was identified.   Echo contrast study showed no right-to-left atrial level shunt,   following an increase in RA pressure induced by provocative   maneuvers. - Superior vena cava: The study excluded a thrombus.  Echo 08/13/18 - Left ventricle: The cavity size was normal. Wall thickness was   increased in a pattern of mild LVH. There was mild focal basal   hypertrophy of the septum. Systolic function was normal. The   estimated ejection fraction was in the range of 55% to 60%. Wall   motion was normal; there were no regional wall  motion   abnormalities. The study is not technically sufficient to allow   evaluation of LV diastolic function. - Left atrium: The atrium was moderately dilated. - Pulmonary arteries: PA peak pressure: 34 mm Hg (S).  Impressions - Normal LV function; mild LVH; moderate LAE; cannot R/O left   atrial mass; suggest TEE to further assess.  Lexiscan MPI 02/2017  Nuclear stress EF: 50%.  There was no ST segment deviation noted during stress.  The study is normal.  This is a low risk study.  The left ventricular ejection fraction is mildly decreased (45-54%).  EKG:  EKG is personally reviewed.  The ekg ordered 04/23/19 is SR with 1st degree AV block  Recent Labs: 08/12/2018: TSH 1.442 08/15/2018: Magnesium 2.1 02/11/2019: ALT 17 04/18/2019: BUN 14; Creatinine, Ser 1.00; Hemoglobin 13.2; Platelets 128; Potassium 4.5; Sodium 142  Recent Lipid Panel    Component Value Date/Time   CHOL 118 08/12/2018 1642   TRIG 56 08/12/2018 1642   HDL 40 (L) 08/12/2018 1642   CHOLHDL 3.0 08/12/2018 1642   VLDL 11 08/12/2018 1642   LDLCALC 67 08/12/2018 1642   LDLDIRECT 139.2 11/21/2012 1122    Physical Exam:    VS:  BP 128/76   Pulse 86   Wt 275 lb 12.8 oz (125.1 kg)   SpO2 98%   BMI 35.41 kg/m     Wt Readings from Last 3 Encounters:  05/07/19 275 lb 12.8 oz (125.1 kg)  04/23/19 265 lb (120.2 kg)  12/17/18 276 lb 6.4 oz (125.4 kg)    GEN: Well nourished, well developed in no acute distress HEENT: Normal NECK: No JVD; No carotid bruits LYMPHATICS: No lymphadenopathy CARDIAC: regular rhythm, normal S1 and S2, no murmurs, rubs, gallops. Radial and DP pulses 2+ bilaterally. RESPIRATORY:  Clear to auscultation without rales, wheezing or rhonchi  ABDOMEN: Soft, non-tender, non-distended MUSCULOSKELETAL:  No edema; No deformity  SKIN: Warm and dry. Vitiligo NEUROLOGIC:  Alert and oriented x 3 PSYCHIATRIC:  Normal affect   ASSESSMENT:    1. Atrial flutter, unspecified type ([place])   2.  Essential hypertension   3. Counseling on health promotion and disease prevention   4. OSA (  obstructive sleep apnea)    PLAN:    atrial flutter, with paroxysmal atrial fibrillation: s/p flutter ablation with Dr. Caryl Comes 6.17.20, doing very well -continue apixaban, no missed doses. Chadsvasc= at least 12 (old stroke seen on CT, age, hypertension) -no further episodes of hematuria, being evaluated by urology -still on diltiazem and metoprolol. Has upcoming follow up with Dr. Caryl Comes. If he does not have further episodes of flutter, would consider dropping diltiazem -continue use of CPAP to treat OSA  Hypertension: at goal today -continue losartan -currently on diltiazem and metoprolol. Consider stopping diltiazem, monitor BP  Secondary prevention: -recommend heart healthy/Mediterranean diet, with whole grains, fruits, vegetable, fish, lean meats, nuts, and olive oil. Limit salt. -recommend moderate walking, 3-5 times/week for 30-50 minutes each session. Aim for at least 150 minutes.week. Goal should be pace of 3 miles/hours, or walking 1.5 miles in 30 minutes -recommend avoidance of tobacco products. Avoid excess alcohol. -Additional risk factor control:  -Diabetes: A1c is not available, denies diagosis  -Lipids: LDL goal <70, most recent 67 on pravastatin  -Blood pressure control: as above  -Weight: BMI 35, we have discussed lifestyle.   Plan for follow up: 1 year or sooner PRN  Buford Dresser, MD, PhD [place]  [facility]   Medication Adjustments/Labs and Tests Ordered: Current medicines are reviewed at length with the patient today.  Concerns regarding medicines are outlined above.  No orders of the defined types were placed in this encounter.  No orders of the defined types were placed in this encounter.   Patient Instructions  Medication Instructions:  Your Physician recommend you continue on your current medication as directed.    If you need a refill on your  cardiac medications before your next appointment, please call your pharmacy.   Lab work: None  Testing/Procedures: None  Follow-Up: At [facility], you and your health needs are our priority.  As part of our continuing mission to provide you with exceptional heart care, we have created designated Provider Care Teams.  These Care Teams include your primary Cardiologist (physician) and Advanced Practice Providers (APPs -  Physician Assistants and Nurse Practitioners) who all work together to provide you with the care you need, when you need it. You will need a follow up appointment in 1 years.  Please call our office 2 months in advance to schedule this appointment.  You may see Buford Dresser, MD or one of the following Advanced Practice Providers on your designated Care Team:   Rosaria Ferries, PA-C . Jory Sims, DNP, ANP      Signed, Buford Dresser, MD PhD 05/07/2019 4:22 PM    [place]

## 2019-05-07 NOTE — Patient Instructions (Signed)

## 2019-05-15 DIAGNOSIS — R310 Gross hematuria: Secondary | ICD-10-CM | POA: Diagnosis not present

## 2019-05-15 DIAGNOSIS — R350 Frequency of micturition: Secondary | ICD-10-CM | POA: Diagnosis not present

## 2019-05-22 ENCOUNTER — Ambulatory Visit: Payer: Medicare HMO | Admitting: Internal Medicine

## 2019-05-29 DIAGNOSIS — G4733 Obstructive sleep apnea (adult) (pediatric): Secondary | ICD-10-CM | POA: Diagnosis not present

## 2019-06-02 ENCOUNTER — Telehealth: Payer: Self-pay | Admitting: Internal Medicine

## 2019-06-02 NOTE — Telephone Encounter (Signed)

## 2019-06-03 ENCOUNTER — Other Ambulatory Visit: Payer: Self-pay

## 2019-06-03 ENCOUNTER — Encounter: Payer: Self-pay | Admitting: Internal Medicine

## 2019-06-03 ENCOUNTER — Ambulatory Visit (INDEPENDENT_AMBULATORY_CARE_PROVIDER_SITE_OTHER): Payer: Medicare HMO | Admitting: Internal Medicine

## 2019-06-03 VITALS — BP 136/78 | HR 60 | Ht 74.0 in | Wt 275.0 lb

## 2019-06-03 DIAGNOSIS — G4733 Obstructive sleep apnea (adult) (pediatric): Secondary | ICD-10-CM

## 2019-06-03 DIAGNOSIS — I4892 Unspecified atrial flutter: Secondary | ICD-10-CM | POA: Diagnosis not present

## 2019-06-03 DIAGNOSIS — Z9989 Dependence on other enabling machines and devices: Secondary | ICD-10-CM

## 2019-06-03 DIAGNOSIS — I10 Essential (primary) hypertension: Secondary | ICD-10-CM | POA: Diagnosis not present

## 2019-06-03 NOTE — Progress Notes (Signed)
Patient Care Team: Vivi Barrack, MD as PCP - General (Family Medicine) Buford Dresser, MD as PCP - Cardiology (Cardiology)   HPI  Gordon Phillips is a 73 y.o. male Seen in hospital 10/19 for atrial flutter.  High CHADS-VASc score with prior stroke precludes discontinuation of anticoagulation even with catheter ablation.  Underwent in-hospital TEE guided cardioversion.  Has reverted to atrial flutter   Significant daytime somnolence; treated sleep apnea with CPAP card followed  S/p Flutter ablation 6/20   Feeling much better  No recurrent palps   DATE TEST EF   4/18 MYOVIEW 50 No Ischemia  10/19 Echo   55-60  % LAE (51/2.0/35)  10/19 TEE  45 %         Date Cr K Hgb  10/19 1.12 4.3 12.5           Records and Results Reviewed   Past Medical History:  Diagnosis Date  . ALLERGIC RHINITIS 01/25/2010  . Arthritis    "neck, back, shoulders," (02/20/2017)  . Chronic lower back pain   . High cholesterol   . Hx of cardiovascular stress test    a. Lexiscan Myoview 02/06/13:  EF 59%, no ischemia or scar  . Hx of echocardiogram    a.  Echo 02/03/13:  Mild LVH, mild focal basal septal hypertrophy, EF 50-55%, Gr 1 DD, mild LAE  . HYPERTENSION 01/25/2010  . HYPERTHYROIDISM 01/25/2010   S/P radioactive iodine "back in the 1980s" (02/20/2017)  . Impaired glucose tolerance   . 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 Obesity   . OSA on CPAP    Uses CPAP nightly  . Pericarditis 1980's  . 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  Marland Kitchen Vitiligo     Past Surgical History:  Procedure Laterality Date  . A-FLUTTER ABLATION N/A 04/23/2019   Procedure: A-FLUTTER ABLATION;  Surgeon: Deboraha Sprang, MD;  Location: [place] CV LAB;  Service: Cardiovascular;  Laterality: N/A;  . CARDIOVERSION N/A 08/14/2018   Procedure: CARDIOVERSION;  Surgeon: [name], MD;  Location: [facility] ENDOSCOPY;  Service: Cardiovascular;   Laterality: N/A;  . INGUINAL HERNIA REPAIR Bilateral   . LUMBAR LAMINECTOMY    . TEE WITHOUT CARDIOVERSION N/A 08/14/2018   Procedure: TRANSESOPHAGEAL ECHOCARDIOGRAM (TEE);  Surgeon: [name], MD;  Location: [facility] ENDOSCOPY;  Service: Cardiovascular;  Laterality: N/A;  . UMBILICAL HERNIA REPAIR      Current Meds  Medication Sig  . acetaminophen (TYLENOL) 650 MG CR tablet Take 1,300 mg by mouth daily.  Marland Kitchen apixaban (ELIQUIS) 5 MG TABS tablet Take 1 tablet (5 mg total) by mouth 2 (two) times daily.  . Aromatic Inhalants (VICKS VAPOINHALER) INHA Place 1 puff into both nostrils as needed (for congestion or stuffiness).  Skipper Cliche SALINE NASAL DROPS NA Place 2 sprays into both nostrils as needed (for congestion).  Marland Kitchen diltiazem (CARDIZEM CD) 120 MG 24 hr capsule Take 1 capsule (120 mg total) by mouth daily.  Marland Kitchen doxazosin (CARDURA) 2 MG tablet Take 1 tablet (2 mg total) by mouth at bedtime.  . metoprolol succinate (TOPROL-XL) 100 MG 24 hr tablet Take 1 tablet (100 mg total) by mouth at bedtime. Take with or immediately following a meal.  . olmesartan (BENICAR) 40 MG tablet TAKE 1 TABLET BY MOUTH EVERY DAY  . pravastatin (PRAVACHOL) 40 MG tablet Take 1 tablet (40 mg total) by mouth at bedtime.  Marland Kitchen  PRESCRIPTION MEDICATION CPAP: At bedtime  . sildenafil (VIAGRA) 100 MG tablet Take 100 mg by mouth daily as needed for erectile dysfunction.     Allergies  Allergen Reactions  . Flomax [Tamsulosin Hcl] Itching  . Relpax [Eletriptan] Other (See Comments) and Hypertension    Chest pain and sweating  . Other Palpitations    Any steroids cause heart palpitations       Review of Systems negative except from HPI and PMH  Physical Exam BP 136/78   Pulse 60   Ht 6\' 2"  (1.88 m)   Wt 275 lb (124.7 kg)   SpO2 94%   BMI 35.31 kg/m   Well developed and nourished in no acute distress HENT normal Neck supple with JVP-  flat   Clear Regular rate and rhythm, no murmurs or gallops Abd-soft with active BS  No Clubbing cyanosis edema Skin-warm and dry A & Oriented  Grossly normal sensory and motor function  ECG sinus 60 28/09/44   Assessment and  Plan  Atrial flutter-recurrent  Obesity  Sleep apnea-treated  TIA  S/p ablation for atrial flutter   No recurrent arrhythmia   Will stop apixoban   Euvolemic continue current meds  Will see prn        Current medicines are reviewed at length with the patient today .  The patient does not  have concerns regarding medicines.

## 2019-06-03 NOTE — Patient Instructions (Addendum)
Medication Instructions:  Your physician has recommended you make the following change in your medication:   Stop Eliquis  Labwork: None ordered.  Testing/Procedures: None ordered.  Follow-Up: Your physician recommends that you schedule a follow-up appointment as needed with Dr. Caryl Comes  Any Other Special Instructions Will Be Listed Below (If Applicable).     If you need a refill on your cardiac medications before your next appointment, please call your pharmacy.

## 2019-06-16 ENCOUNTER — Telehealth: Payer: Self-pay | Admitting: Family Medicine

## 2019-06-16 NOTE — Telephone Encounter (Signed)
I left a message asking the patient to call me at [phone] to schedule AWV with Courtney. VDM (Dee-Dee) °

## 2019-06-24 ENCOUNTER — Ambulatory Visit (HOSPITAL_COMMUNITY)
Admission: EM | Admit: 2019-06-24 | Discharge: 2019-06-24 | Discharge status: Against Medical Advice | Payer: Medicare HMO

## 2019-07-03 ENCOUNTER — Ambulatory Visit (INDEPENDENT_AMBULATORY_CARE_PROVIDER_SITE_OTHER): Payer: Medicare HMO

## 2019-07-03 ENCOUNTER — Other Ambulatory Visit: Payer: Self-pay

## 2019-07-03 VITALS — BP 126/82 | Temp 97.9°F | Ht 74.0 in | Wt 276.2 lb

## 2019-07-03 DIAGNOSIS — Z0000 Encounter for general adult medical examination without abnormal findings: Secondary | ICD-10-CM | POA: Diagnosis not present

## 2019-07-03 NOTE — Progress Notes (Signed)
I have personally reviewed the Medicare Annual Wellness Visit and agree with the assessment and plan.  Algis Greenhouse. [name], MD 07/03/2019 2:16 PM

## 2019-07-03 NOTE — Progress Notes (Signed)
Subjective:   Sylvesta Kottman is a 73 y.o. male who presents for Medicare Annual/Subsequent preventive examination.  Review of Systems:   Cardiac Risk Factors include: advanced age (>15men, >77 women);hypertension;male gender;dyslipidemia     Objective:    Vitals: BP 126/82   Temp 97.9 F (36.6 C)   Ht 6\' 2"  (1.88 m)   Wt 276 lb 3.2 oz (125.3 kg)   BMI 35.46 kg/m   Body mass index is 35.46 kg/m.  Advanced Directives 07/03/2019 04/23/2019 08/13/2018 08/12/2018 08/13/2017 02/20/2017 02/20/2017  Does Patient Have a Medical Advance Directive? No No No Yes No No No  Would patient like information on creating a medical advance directive? Yes (MAU/Ambulatory/Procedural Areas - Information given) - No - Patient declined - - Yes (Inpatient - patient defers creating a medical advance directive at this time) -  Pre-existing out of facility DNR order (yellow form or pink MOST form) - - - - - - -    Tobacco Social History   Tobacco Use  Smoking Status Former Smoker  . Packs/day: 0.50  . Years: 8.00  . Pack years: 4.00  . Types: Cigarettes  . Quit date: 18  . Years since quitting: 52.6  Smokeless Tobacco Never Used     Counseling given: Not Answered   Clinical Intake:  Pre-visit preparation completed: Yes  Pain : No/denies pain  Nutritional Status: BMI > 30  Obese Diabetes: No  How often do you need to have someone help you when you read instructions, pamphlets, or other written materials from your doctor or pharmacy?: 1 - Never  Interpreter Needed?: No  Information entered by :: Denman George LPN  Past Medical History:  Diagnosis Date  . ALLERGIC RHINITIS 01/25/2010  . Arthritis    "neck, back, shoulders," (02/20/2017)  . Chronic lower back pain   . High cholesterol   . Hx of cardiovascular stress test    a. Lexiscan Myoview 02/06/13:  EF 59%, no ischemia or scar  . Hx of echocardiogram    a.  Echo 02/03/13:  Mild LVH, mild focal basal septal hypertrophy, EF 50-55%, Gr  1 DD, mild LAE  . HYPERTENSION 01/25/2010  . HYPERTHYROIDISM 01/25/2010   S/P radioactive iodine "back in the 1980s" (02/20/2017)  . Impaired glucose tolerance   . 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 Obesity   . OSA on CPAP    Uses CPAP nightly  . Pericarditis 1980's  . 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  Marland Kitchen Vitiligo    Past Surgical History:  Procedure Laterality Date  . A-FLUTTER ABLATION N/A 04/23/2019   Procedure: A-FLUTTER ABLATION;  Surgeon: Deboraha Sprang, MD;  Location: [facility];  Service: Cardiovascular;  Laterality: N/A;  . CARDIOVERSION N/A 08/14/2018   Procedure: CARDIOVERSION;  Surgeon: [name], MD;  Location: [facility] ENDOSCOPY;  Service: Cardiovascular;  Laterality: N/A;  . INGUINAL HERNIA REPAIR Bilateral   . LUMBAR LAMINECTOMY    . TEE WITHOUT CARDIOVERSION N/A 08/14/2018   Procedure: TRANSESOPHAGEAL ECHOCARDIOGRAM (TEE);  Surgeon: [name], MD;  Location[facility] ENDOSCOPY;  Service: Cardiovascular;  Laterality: N/A;  . UMBILICAL HERNIA REPAIR     Family History  Problem Relation Age of Onset  . Stroke Father        Passed away in his 62s  . Thyroid disease Sister        goiter  . Sudden death Brother        ?  Drug use, no autopsy  . Throat cancer Brother   . Colon cancer Neg Hx   . Esophageal cancer Neg Hx   . Rectal cancer Neg Hx   . Stomach cancer Neg Hx    Social History   Socioeconomic History  . Marital status: Married    Spouse name: Not on file  . Number of children: 2  . Years of education: Not on file  . Highest education level: Not on file  Occupational History  . Occupation: Geographical information systems officer  . Financial resource strain: Not on file  . Food insecurity    Worry: Not on file    Inability: Not on file  . Transportation needs    Medical: Not on file    Non-medical: Not on file  Tobacco Use  . Smoking status: Former Smoker    Packs/day: 0.50    Years:  8.00    Pack years: 4.00    Types: Cigarettes    Quit date: 1968    Years since quitting: 52.6  . Smokeless tobacco: Never Used  Substance and Sexual Activity  . Alcohol use: Yes    Comment: 02/20/2017 "couple drinks/month on average"  . Drug use: No  . Sexual activity: Yes  Lifestyle  . Physical activity    Days per week: Not on file    Minutes per session: Not on file  . Stress: Not on file  Relationships  . Social Herbalist on phone: Not on file    Gets together: Not on file    Attends religious service: Not on file    Active member of club or organization: Not on file    Attends meetings of clubs or organizations: Not on file    Relationship status: Not on file  Other Topics Concern  . Not on file  Social History Narrative  . Not on file    Outpatient Encounter Medications as of 07/03/2019  Medication Sig  . acetaminophen (TYLENOL) 650 MG CR tablet Take 1,300 mg by mouth daily.  . Aromatic Inhalants (VICKS VAPOINHALER) INHA Place 1 puff into both nostrils as needed (for congestion or stuffiness).  Skipper Cliche SALINE NASAL DROPS NA Place 2 sprays into both nostrils as needed (for congestion).  Marland Kitchen diltiazem (CARDIZEM CD) 120 MG 24 hr capsule Take 1 capsule (120 mg total) by mouth daily.  Marland Kitchen doxazosin (CARDURA) 2 MG tablet Take 1 tablet (2 mg total) by mouth at bedtime.  . metoprolol succinate (TOPROL-XL) 100 MG 24 hr tablet Take 1 tablet (100 mg total) by mouth at bedtime. Take with or immediately following a meal.  . olmesartan (BENICAR) 40 MG tablet TAKE 1 TABLET BY MOUTH EVERY DAY  . pravastatin (PRAVACHOL) 40 MG tablet Take 1 tablet (40 mg total) by mouth at bedtime.  Marland Kitchen PRESCRIPTION MEDICATION CPAP: At bedtime  . sildenafil (VIAGRA) 100 MG tablet Take 100 mg by mouth daily as needed for erectile dysfunction.    No facility-administered encounter medications on file as of 07/03/2019.     Activities of Daily Living In your present state of health, do you have any  difficulty performing the following activities: 07/03/2019 08/13/2018  Hearing? N N  Vision? N N  Difficulty concentrating or making decisions? N N  Walking or climbing stairs? N N  Dressing or bathing? N N  Doing errands, shopping? N N  Preparing Food and eating ? N -  Using the Toilet? N -  In the past six months,  have you accidently leaked urine? N -  Do you have problems with loss of bowel control? N -  Managing your Medications? N -  Managing your Finances? N -  Housekeeping or managing your Housekeeping? N -  Some recent data might be hidden    Patient Care Team: Vivi Barrack, MD as PCP - General (Family Medicine) Buford Dresser, MD as PCP - Cardiology (Cardiology) Truitt Merle, MD as Consulting Physician (Hematology) [place], P.A. as Consulting Physician (Ophthalmology)   Assessment:   This is a routine wellness examination for [facility].  Exercise Activities and Dietary recommendations Current Exercise Habits: The patient does not participate in regular exercise at present, Exercise limited by: cardiac condition(s)  Goals    . DIET - REDUCE PORTION SIZE     Patient would like to change eating habits and decrease meal size at dinner    . Increase physical activity     Would like to gradually add exercise into daily routine        Fall Risk Fall Risk  07/03/2019 08/20/2018 03/20/2016 08/10/2014  Falls in the past year? 0 No No No  Number falls in past yr: 0 - - -  Injury with Fall? 0 - - -   Is the patient's home free of loose throw rugs in walkways, pet beds, electrical cords, etc?   Yes       Grab bars in the bathroom? Yes      Handrails on the stairs?   Yes      Adequate lighting?   Yes   Timed Get Up and Go Performed: completed and within normal timeframe   Depression Screen PHQ 2/9 Scores 08/20/2018 03/20/2016 08/10/2014  PHQ - 2 Score 0 0 0    Cognitive Function-no cognitive concerns      6CIT Screen 07/03/2019  What Year? 0 points   What month? 0 points  What time? 0 points  Count back from 20 0 points  Months in reverse 0 points  Repeat phrase 0 points  Total Score 0    Immunization History  Administered Date(s) Administered  . Influenza Split 08/10/2011, 08/21/2012  . Influenza, High Dose Seasonal PF 08/24/2017  . Influenza,inj,Quad PF,6+ Mos 08/07/2013, 08/10/2014, 08/12/2018  . Influenza-Unspecified 06/20/2016  . Pneumococcal Conjugate-13 08/10/2014  . Pneumococcal Polysaccharide-23 03/20/2016  . Tdap 12/14/2014    Qualifies for Shingles Vaccine? Discussed and patient will check with pharmacy for coverage.  Patient education handout provided    Screening Tests Health Maintenance  Topic Date Due  . INFLUENZA VACCINE  06/07/2019  . COLONOSCOPY  05/15/2023  . TETANUS/TDAP  12/14/2024  . Hepatitis C Screening  Completed  . PNA vac Low Risk Adult  Completed   Cancer Screenings: Lung: Low Dose CT Chest recommended if Age 62-80 years, 30 pack-year currently smoking OR have quit w/in 15years. Patient does not qualify. Colorectal: completed 05/14/18 with Dr. Henrene Pastor      Plan:  I have personally reviewed and addressed the Medicare Annual Wellness questionnaire and have noted the following in the patient's chart:  A. Medical and social history B. Use of alcohol, tobacco or illicit drugs  C. Current medications and supplements D. Functional ability and status E.  Nutritional status F.  Physical activity G. Advance directives H. List of other physicians I.  Hospitalizations, surgeries, and ER visits in previous 12 months J.  [place] such as hearing and vision if needed, cognitive and depression L. Referrals, records requested, and appointments- none  In addition, I have reviewed and discussed with patient certain preventive protocols, quality metrics, and best practice recommendations. A written personalized care plan for preventive services as well as general preventive health  recommendations were provided to patient.   Signed,  Denman George, LPN  Nurse Health Advisor   Nurse Notes: no additions

## 2019-07-03 NOTE — Patient Instructions (Addendum)
Mr. Gordon Phillips , Thank you for taking time to come for your Medicare Wellness Visit. I appreciate your ongoing commitment to your health goals. Please review the following plan we discussed and let me know if I can assist you in the future.   Screening recommendations/referrals: Colorectal Screening: completed 05/14/18 with Dr. Henrene Pastor   Vision and Dental Exams: Recommended annual ophthalmology exams for early detection of glaucoma and other disorders of the eye Recommended annual dental exams for proper oral hygiene  Vaccinations: Influenza vaccine:  recommended this fall either at PCP office or through your local pharmacy  Pneumococcal vaccine: completed 08/10/14 Tdap vaccine: up to date; last 12/14/14  Shingles vaccine: Please call your insurance company to determine your out of pocket expense for the Shingrix vaccine. You may receive this vaccine at your local pharmacy.  Advanced directives: Advance directives discussed with you today. I have provided a copy for you to complete at home and have notarized. Once this is complete please bring a copy in to our office so we can scan it into your chart.  Goals: Recommend to drink at least 6-8 8oz glasses of water per day and to try the DASH diet as directed   Next appointment: Please schedule your Annual Wellness Visit with your Nurse Health Advisor in one year.  Preventive Care 59 Years and Older, Male Preventive care refers to lifestyle choices and visits with your health care provider that can promote health and wellness. What does preventive care include?  A yearly physical exam. This is also called an annual well check.  Dental exams once or twice a year.  Routine eye exams. Ask your health care provider how often you should have your eyes checked.  Personal lifestyle choices, including:  Daily care of your teeth and gums.  Regular physical activity.  Eating a healthy diet.  Avoiding tobacco and drug use.  Limiting alcohol use.   Practicing safe sex.  Taking low doses of aspirin every day if recommended by your health care provider..  Taking vitamin and mineral supplements as recommended by your health care provider. What happens during an annual well check? The services and screenings done by your health care provider during your annual well check will depend on your age, overall health, lifestyle risk factors, and family history of disease. Counseling  Your health care provider may ask you questions about your:  Alcohol use.  Tobacco use.  Drug use.  Emotional well-being.  Home and relationship well-being.  Sexual activity.  Eating habits.  History of falls.  Memory and ability to understand (cognition).  Work and work Statistician. Screening  You may have the following tests or measurements:  Height, weight, and BMI.  Blood pressure.  Lipid and cholesterol levels. These may be checked every 5 years, or more frequently if you are over 43 years old.  Skin check.  Lung cancer screening. You may have this screening every year starting at age 72 if you have a 30-pack-year history of smoking and currently smoke or have quit within the past 15 years.  Fecal occult blood test (FOBT) of the stool. You may have this test every year starting at age 22.  Flexible sigmoidoscopy or colonoscopy. You may have a sigmoidoscopy every 5 years or a colonoscopy every 10 years starting at age 21.  Prostate cancer screening. Recommendations will vary depending on your family history and other risks.  Hepatitis C blood test.  Hepatitis B blood test.  Sexually transmitted disease (STD) testing.  Diabetes screening.  This is done by checking your blood sugar (glucose) after you have not eaten for a while (fasting). You may have this done every 1-3 years.  Abdominal aortic aneurysm (AAA) screening. You may need this if you are a current or former smoker.  Osteoporosis. You may be screened starting at age 1  if you are at high risk. Talk with your health care provider about your test results, treatment options, and if necessary, the need for more tests. Vaccines  Your health care provider may recommend certain vaccines, such as:  Influenza vaccine. This is recommended every year.  Tetanus, diphtheria, and acellular pertussis (Tdap, Td) vaccine. You may need a Td booster every 10 years.  Zoster vaccine. You may need this after age 65.  Pneumococcal 13-valent conjugate (PCV13) vaccine. One dose is recommended after age 94.  Pneumococcal polysaccharide (PPSV23) vaccine. One dose is recommended after age 70. Talk to your health care provider about which screenings and vaccines you need and how often you need them. This information is not intended to replace advice given to you by your health care provider. Make sure you discuss any questions you have with your health care provider. Document Released: 11/19/2015 Document Revised: 07/12/2016 Document Reviewed: 08/24/2015 Elsevier Interactive Patient Education  2017 [employer] Prevention in the Home Falls can cause injuries. They can happen to people of all ages. There are many things you can do to make your home safe and to help prevent falls. What can I do on the outside of my home?  Regularly fix the edges of walkways and driveways and fix any cracks.  Remove anything that might make you trip as you walk through a door, such as a raised step or threshold.  Trim any bushes or trees on the path to your home.  Use bright outdoor lighting.  Clear any walking paths of anything that might make someone trip, such as rocks or tools.  Regularly check to see if handrails are loose or broken. Make sure that both sides of any steps have handrails.  Any raised decks and porches should have guardrails on the edges.  Have any leaves, snow, or ice cleared regularly.  Use sand or salt on walking paths during winter.  Clean up any spills in  your garage right away. This includes oil or grease spills. What can I do in the bathroom?  Use night lights.  Install grab bars by the toilet and in the tub and shower. Do not use towel bars as grab bars.  Use non-skid mats or decals in the tub or shower.  If you need to sit down in the shower, use a plastic, non-slip stool.  Keep the floor dry. Clean up any water that spills on the floor as soon as it happens.  Remove soap buildup in the tub or shower regularly.  Attach bath mats securely with double-sided non-slip rug tape.  Do not have throw rugs and other things on the floor that can make you trip. What can I do in the bedroom?  Use night lights.  Make sure that you have a light by your bed that is easy to reach.  Do not use any sheets or blankets that are too big for your bed. They should not hang down onto the floor.  Have a firm chair that has side arms. You can use this for support while you get dressed.  Do not have throw rugs and other things on the floor that can make you  trip. What can I do in the kitchen?  Clean up any spills right away.  Avoid walking on wet floors.  Keep items that you use a lot in easy-to-reach places.  If you need to reach something above you, use a strong step stool that has a grab bar.  Keep electrical cords out of the way.  Do not use floor polish or wax that makes floors slippery. If you must use wax, use non-skid floor wax.  Do not have throw rugs and other things on the floor that can make you trip. What can I do with my stairs?  Do not leave any items on the stairs.  Make sure that there are handrails on both sides of the stairs and use them. Fix handrails that are broken or loose. Make sure that handrails are as long as the stairways.  Check any carpeting to make sure that it is firmly attached to the stairs. Fix any carpet that is loose or worn.  Avoid having throw rugs at the top or bottom of the stairs. If you do have  throw rugs, attach them to the floor with carpet tape.  Make sure that you have a light switch at the top of the stairs and the bottom of the stairs. If you do not have them, ask someone to add them for you. What else can I do to help prevent falls?  Wear shoes that:  Do not have high heels.  Have rubber bottoms.  Are comfortable and fit you well.  Are closed at the toe. Do not wear sandals.  If you use a stepladder:  Make sure that it is fully opened. Do not climb a closed stepladder.  Make sure that both sides of the stepladder are locked into place.  Ask someone to hold it for you, if possible.  Clearly mark and make sure that you can see:  Any grab bars or handrails.  First and last steps.  Where the edge of each step is.  Use tools that help you move around (mobility aids) if they are needed. These include:  Canes.  Walkers.  Scooters.  Crutches.  Turn on the lights when you go into a dark area. Replace any light bulbs as soon as they burn out.  Set up your furniture so you have a clear path. Avoid moving your furniture around.  If any of your floors are uneven, fix them.  If there are any pets around you, be aware of where they are.  Review your medicines with your doctor. Some medicines can make you feel dizzy. This can increase your chance of falling. Ask your doctor what other things that you can do to help prevent falls. This information is not intended to replace advice given to you by your health care provider. Make sure you discuss any questions you have with your health care provider. Document Released: 08/19/2009 Document Revised: 03/30/2016 Document Reviewed: 11/27/2014 Elsevier Interactive Patient Education  2017 [employer].

## 2019-08-08 ENCOUNTER — Telehealth: Payer: Self-pay | Admitting: Hematology

## 2019-08-08 NOTE — Telephone Encounter (Signed)
R/s appt per 10/1 sch message - unable to reach pt The patient left the office before the visit was finished.left  Message with appt date and time

## 2019-08-13 ENCOUNTER — Emergency Department (HOSPITAL_COMMUNITY): Payer: Medicare HMO

## 2019-08-13 ENCOUNTER — Other Ambulatory Visit: Payer: Self-pay

## 2019-08-13 ENCOUNTER — Emergency Department (HOSPITAL_COMMUNITY)
Admission: EM | Admit: 2019-08-13 | Discharge: 2019-08-13 | Disposition: A | Discharge status: Home / Self Care | Payer: Medicare HMO | Attending: Emergency Medicine | Admitting: Emergency Medicine

## 2019-08-13 ENCOUNTER — Encounter (HOSPITAL_COMMUNITY): Payer: Self-pay

## 2019-08-13 DIAGNOSIS — Z87891 Personal history of nicotine dependence: Secondary | ICD-10-CM | POA: Insufficient documentation

## 2019-08-13 DIAGNOSIS — I10 Essential (primary) hypertension: Secondary | ICD-10-CM | POA: Diagnosis not present

## 2019-08-13 DIAGNOSIS — M5416 Radiculopathy, lumbar region: Secondary | ICD-10-CM | POA: Diagnosis not present

## 2019-08-13 DIAGNOSIS — Z8673 Personal history of transient ischemic attack (TIA), and cerebral infarction without residual deficits: Secondary | ICD-10-CM | POA: Diagnosis not present

## 2019-08-13 DIAGNOSIS — M542 Cervicalgia: Secondary | ICD-10-CM | POA: Diagnosis not present

## 2019-08-13 DIAGNOSIS — M79605 Pain in left leg: Secondary | ICD-10-CM

## 2019-08-13 DIAGNOSIS — M545 Low back pain: Secondary | ICD-10-CM | POA: Diagnosis not present

## 2019-08-13 DIAGNOSIS — Z79899 Other long term (current) drug therapy: Secondary | ICD-10-CM | POA: Insufficient documentation

## 2019-08-13 LAB — CBC WITH DIFFERENTIAL/PLATELET
Abs Immature Granulocytes: 0.02 10*3/uL (ref 0.00–0.07)
Basophils Absolute: 0 10*3/uL (ref 0.0–0.1)
Basophils Relative: 1 %
Eosinophils Absolute: 0.4 10*3/uL (ref 0.0–0.5)
Eosinophils Relative: 5 %
HCT: 40 % (ref 39.0–52.0)
Hemoglobin: 13.6 g/dL (ref 13.0–17.0)
Immature Granulocytes: 0 %
Lymphocytes Relative: 39 %
Lymphs Abs: 2.6 10*3/uL (ref 0.7–4.0)
MCH: 31.2 pg (ref 26.0–34.0)
MCHC: 34 g/dL (ref 30.0–36.0)
MCV: 91.7 fL (ref 80.0–100.0)
Monocytes Absolute: 0.6 10*3/uL (ref 0.1–1.0)
Monocytes Relative: 9 %
Neutro Abs: 3.1 10*3/uL (ref 1.7–7.7)
Neutrophils Relative %: 46 %
Platelets: 125 10*3/uL — ABNORMAL LOW (ref 150–400)
RBC: 4.36 MIL/uL (ref 4.22–5.81)
RDW: 13.6 % (ref 11.5–15.5)
WBC: 6.8 10*3/uL (ref 4.0–10.5)
nRBC: 0 % (ref 0.0–0.2)

## 2019-08-13 LAB — COMPREHENSIVE METABOLIC PANEL
ALT: 20 U/L (ref 0–44)
AST: 25 U/L (ref 15–41)
Albumin: 3.8 g/dL (ref 3.5–5.0)
Alkaline Phosphatase: 54 U/L (ref 38–126)
Anion gap: 9 (ref 5–15)
BUN: 18 mg/dL (ref 8–23)
CO2: 25 mmol/L (ref 22–32)
Calcium: 9.5 mg/dL (ref 8.9–10.3)
Chloride: 106 mmol/L (ref 98–111)
Creatinine, Ser: 1.08 mg/dL (ref 0.61–1.24)
GFR calc Af Amer: >60 mL/min (ref >60)
GFR calc non Af Amer: >60 mL/min (ref >60)
Glucose, Bld: 70 mg/dL (ref 70–99)
Potassium: 3.9 mmol/L (ref 3.5–5.1)
Sodium: 140 mmol/L (ref 135–145)
Total Bilirubin: 0.9 mg/dL (ref 0.3–1.2)
Total Protein: 7.2 g/dL (ref 6.5–8.1)

## 2019-08-13 LAB — TROPONIN I (HIGH SENSITIVITY): Troponin I (High Sensitivity): 3 ng/L (ref <18)

## 2019-08-13 LAB — CK: Total CK: 381 U/L (ref 49–397)

## 2019-08-13 MED ORDER — ACETAMINOPHEN-CODEINE #3 300-30 MG PO TABS
1.0000 | ORAL_TABLET | Freq: Once | ORAL | Status: AC
  Administered 2019-08-13: 1 via ORAL
  Filled 2019-08-13: qty 1

## 2019-08-13 MED ORDER — CYCLOBENZAPRINE HCL 5 MG PO TABS: 5.0000 mg | ORAL_TABLET | Freq: Three times a day (TID) | ORAL | 0 refills | Status: DC | PRN

## 2019-08-13 MED ORDER — ENOXAPARIN SODIUM 150 MG/ML ~~LOC~~ SOLN
125.0000 mg | Freq: Once | SUBCUTANEOUS | Status: AC
  Administered 2019-08-13: 125 mg via SUBCUTANEOUS
  Filled 2019-08-13: qty 0.83

## 2019-08-13 MED ORDER — METHYLPREDNISOLONE SODIUM SUCC 125 MG IJ SOLR: 125.0000 mg | Freq: Once | INTRAMUSCULAR | Status: DC

## 2019-08-13 MED ORDER — CYCLOBENZAPRINE HCL 10 MG PO TABS
5.0000 mg | ORAL_TABLET | Freq: Once | ORAL | Status: AC
  Administered 2019-08-13: 5 mg via ORAL
  Filled 2019-08-13: qty 1

## 2019-08-13 MED ORDER — ACETAMINOPHEN-CODEINE #3 300-30 MG PO TABS: 1.0000 | ORAL_TABLET | Freq: Four times a day (QID) | ORAL | 0 refills | Status: DC | PRN

## 2019-08-13 NOTE — Discharge Instructions (Signed)
Come back tomorrow morning for left leg ultrasound. You should get a call in the morning. If nobody calls you by 10 am please call the main hospital number and ask for the vascular ultrasound department   You likely have a pinched nerve in your back   See your orthopedic doctor   Return to ER if you have worse back pain, leg pain, numbness, weakness

## 2019-08-13 NOTE — ED Notes (Signed)
Patient transported to CT 

## 2019-08-13 NOTE — ED Triage Notes (Signed)
Patient complains of left sided neck pain x 1 week that has improved and also complains of left leg discomfort. Started using eliptical and thinks this may be related. Describes burning to left lower leg and posterior upper leg.  States that the pain is worse when leg elevated

## 2019-08-13 NOTE — ED Provider Notes (Signed)
[place] EMERGENCY DEPARTMENT Provider Note   CSN: [id number] Arrival date & time: 08/13/19  1747     History   Chief Complaint Chief Complaint  Patient presents with  . Neck Pain  . Leg Pain    HPI Gordon Phillips is a 73 y.o. male hx of high cholesterol, hypertension, OSA here presenting with left numbness, neck pain, left leg pain.  Patient states that he recently had a ablation for A. fib and has been off Eliquis.  Patient decided to exercise on the elliptical about a week ago. He states that he then had sudden onset of left-sided neck pain radiating down his chest about a week ago.  He states for the last week or so he has been having low back pain radiating down his left leg.  He has some burning sensation in the back of his left leg as well.  He denies any shortness of breath.  He states that he was concerned that he may have a blood clot since he is off of blood thinners now.     The history is provided by the patient.    Past Medical History:  Diagnosis Date  . ALLERGIC RHINITIS 01/25/2010  . Arthritis    "neck, back, shoulders," (02/20/2017)  . Chronic lower back pain   . High cholesterol   . Hx of cardiovascular stress test    a. Lexiscan Myoview 02/06/13:  EF 59%, no ischemia or scar  . Hx of echocardiogram    a.  Echo 02/03/13:  Mild LVH, mild focal basal septal hypertrophy, EF 50-55%, Gr 1 DD, mild LAE  . HYPERTENSION 01/25/2010  . HYPERTHYROIDISM 01/25/2010   S/P radioactive iodine "back in the 1980s" (02/20/2017)  . Impaired glucose tolerance   . 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 Obesity   . OSA on CPAP    Uses CPAP nightly  . Pericarditis 1980's  . 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  Marland Kitchen Vitiligo     Patient Active Problem List   Diagnosis Date Noted  . Deviated septum 08/20/2018  . BPH (benign prostatic hyperplasia) 08/20/2018  . Cough 08/20/2018  . Anal irritation  08/20/2018  . Atrial flutter ([place]) 08/12/2018  . HLD (hyperlipidemia) 02/20/2017  . History of CVA (cerebrovascular accident) 02/20/2017  . Back pain, chronic 11/22/2015  . Morbid obesity ([place]) 05/31/2015  . MGUS (monoclonal gammopathy of unknown significance) 01/21/2015  . OSA (obstructive sleep apnea) 03/16/2014  . Impaired glucose tolerance 05/27/2012  . Thyrotoxicosis 01/25/2010  . Essential hypertension 01/25/2010  . Allergic rhinitis 01/25/2010    Past Surgical History:  Procedure Laterality Date  . A-FLUTTER ABLATION N/A 04/23/2019   Procedure: A-FLUTTER ABLATION;  Surgeon: Deboraha Sprang, MD;  Location: [place] CV LAB;  Service: Cardiovascular;  Laterality: N/A;  . CARDIOVERSION N/A 08/14/2018   Procedure: CARDIOVERSION;  Surgeon: [name], MD;  Location: [facility] ENDOSCOPY;  Service: Cardiovascular;  Laterality: N/A;  . INGUINAL HERNIA REPAIR Bilateral   . LUMBAR LAMINECTOMY    . TEE WITHOUT CARDIOVERSION N/A 08/14/2018   Procedure: TRANSESOPHAGEAL ECHOCARDIOGRAM (TEE);  Surgeon: [name], MD;  Location: [facility] ENDOSCOPY;  Service: Cardiovascular;  Laterality: N/A;  . UMBILICAL HERNIA REPAIR          Home Medications    Prior to Admission medications   Medication Sig Start Date End Date Taking? Authorizing Provider  acetaminophen (TYLENOL) 650 MG  CR tablet Take 1,300 mg by mouth daily.    [provider]  Aromatic Inhalants (VICKS VAPOINHALER) INHA Place 1 puff into both nostrils as needed (for congestion or stuffiness).    [provider]  AYR SALINE NASAL DROPS NA Place 2 sprays into both nostrils as needed (for congestion).    [provider]  diltiazem (CARDIZEM CD) 120 MG 24 hr capsule Take 1 capsule (120 mg total) by mouth daily. 01/07/19   Vivi Barrack, MD  doxazosin (CARDURA) 2 MG tablet Take 1 tablet (2 mg total) by mouth at bedtime. 01/07/19   Vivi Barrack, MD  metoprolol succinate (TOPROL-XL) 100 MG 24 hr tablet Take 1 tablet  (100 mg total) by mouth at bedtime. Take with or immediately following a meal. 01/07/19   Vivi Barrack, MD  olmesartan (BENICAR) 40 MG tablet TAKE 1 TABLET BY MOUTH EVERY DAY 04/11/19   Vivi Barrack, MD  pravastatin (PRAVACHOL) 40 MG tablet Take 1 tablet (40 mg total) by mouth at bedtime. 01/07/19   Vivi Barrack, MD  PRESCRIPTION MEDICATION CPAP: At bedtime    [provider]  sildenafil (VIAGRA) 100 MG tablet Take 100 mg by mouth daily as needed for erectile dysfunction.     [provider]    Family History Family History  Problem Relation Age of Onset  . Stroke Father        Passed away in his 66s  . Thyroid disease Sister        goiter  . Sudden death Brother        ? Drug use, no autopsy  . Throat cancer Brother   . Colon cancer Neg Hx   . Esophageal cancer Neg Hx   . Rectal cancer Neg Hx   . Stomach cancer Neg Hx     Social History Social History   Tobacco Use  . Smoking status: Former Smoker    Packs/day: 0.50    Years: 8.00    Pack years: 4.00    Types: Cigarettes    Quit date: 1968    Years since quitting: 52.8  . Smokeless tobacco: Never Used  Substance Use Topics  . Alcohol use: Yes    Comment: 02/20/2017 "couple drinks/month on average"  . Drug use: No     Allergies   Flomax [tamsulosin hcl], Relpax [eletriptan], and Other   Review of Systems Review of Systems  Musculoskeletal: Positive for neck pain.  All other systems reviewed and are negative.    Physical Exam Updated Vital Signs BP (!) 152/79   Pulse 70   Temp 98.4 F (36.9 C) (Oral)   Resp 19   SpO2 99%   Physical Exam Vitals signs and nursing note reviewed.  HENT:     Head: Normocephalic.     Nose: Nose normal.     Mouth/Throat:     Mouth: Mucous membranes are moist.  Eyes:     Extraocular Movements: Extraocular movements intact.     Pupils: Pupils are equal, round, and reactive to light.  Neck:     Musculoskeletal: Normal range of motion.     Comments:  Mild L paracervical tenderness  Cardiovascular:     Rate and Rhythm: Normal rate.     Pulses: Normal pulses.     Heart sounds: Normal heart sounds.  Pulmonary:     Effort: Pulmonary effort is normal.     Breath sounds: Normal breath sounds.  Abdominal:     General: Abdomen  is flat.     Palpations: Abdomen is soft.  Musculoskeletal: Normal range of motion.     Comments: Mild L posterior thigh tenderness, L lumbar tenderness, no saddle anesthesia   Skin:    General: Skin is warm.     Capillary Refill: Capillary refill takes less than 2 seconds.  Neurological:     Mental Status: He is alert and oriented to person, place, and time.     Comments: Strength 5/5 bilateral arms and legs. No saddle anesthesia. Nl sensation bilaterally. + straight leg raise L leg   Psychiatric:        Mood and Affect: Mood normal.      ED Treatments / Results  Labs (all labs ordered are listed, but only abnormal results are displayed) Labs Reviewed  CBC WITH DIFFERENTIAL/PLATELET - Abnormal; Notable for the following components:      Result Value   Platelets 125 (*)    All other components within normal limits  COMPREHENSIVE METABOLIC PANEL  CK  TROPONIN I (HIGH SENSITIVITY)  TROPONIN I (HIGH SENSITIVITY)    EKG EKG Interpretation  Date/Time:  Wednesday August 13 2019 22:12:54 EDT Ventricular Rate:  70 PR Interval:    QRS Duration: 93 QT Interval:  404 QTC Calculation: 436 R Axis:   74 Text Interpretation:  Sinus rhythm Prolonged PR interval Borderline ST elevation, inferior leads No significant change since last tracing Confirmed by Wandra Arthurs [phone]) on 08/13/2019 11:03:03 PM   Radiology No results found.  Procedures Procedures (including critical care time)  Medications Ordered in ED Medications  enoxaparin (LOVENOX) injection 125 mg (has no administration in time range)  cyclobenzaprine (FLEXERIL) tablet 5 mg (5 mg Oral Given 08/13/19 2207)  acetaminophen-codeine (TYLENOL #3)  300-30 MG per tablet 1 tablet (1 tablet Oral Given 08/13/19 2206)     Initial Impression / Assessment and Plan / ED Course  I have reviewed the triage vital signs and the nursing notes.  Pertinent labs & imaging results that were available during my care of the patient were reviewed by me and considered in my medical decision making (see chart for details).       Keevan Kimberling is a 73 y.o. male here with L back pain, neck pain a week ago. Likely muscle strain vs radiculopathy. Patient worried about DVT but I have low suspicion and the vascular US team has left. Will get CT neck, lumbar spine. No saddle anesthesia or neuro deficits to require MRI.   11:20 PM Patient's labs unremarkable. CT showed L L5-S1 neural foraminal stenosis likely causing his pain and radiculopathy. Outpatient L leg DVT study ordered. Given a dose of lovenox. Will dc home. Has ortho follow up for his back   Final Clinical Impressions(s) / ED Diagnoses   Final diagnoses:  Lumbar radiculopathy    ED Discharge Orders         Ordered    LE VENOUS     08/13/19 2303           Drenda Freeze, MD 08/13/19 [phone]

## 2019-08-13 NOTE — ED Notes (Signed)
Pt returned from CT °

## 2019-08-14 ENCOUNTER — Other Ambulatory Visit: Payer: Medicare Other

## 2019-08-14 ENCOUNTER — Ambulatory Visit: Payer: Medicare Other | Admitting: Hematology

## 2019-08-14 ENCOUNTER — Ambulatory Visit (HOSPITAL_COMMUNITY)
Admission: RE | Admit: 2019-08-14 | Discharge: 2019-08-14 | Disposition: A | Discharge status: Home / Self Care | Payer: Medicare HMO | Source: Ambulatory Visit | Attending: Emergency Medicine | Admitting: Emergency Medicine

## 2019-08-14 ENCOUNTER — Ambulatory Visit (HOSPITAL_COMMUNITY): Payer: Medicare HMO

## 2019-08-14 DIAGNOSIS — R52 Pain, unspecified: Secondary | ICD-10-CM

## 2019-08-14 DIAGNOSIS — M79605 Pain in left leg: Secondary | ICD-10-CM | POA: Insufficient documentation

## 2019-08-14 NOTE — Progress Notes (Signed)
Lower extremity venous has been completed.   Preliminary results in CV Proc.   Abram Sander 08/14/2019 4:22 PM

## 2019-08-18 ENCOUNTER — Ambulatory Visit: Payer: Medicare HMO | Admitting: Hematology

## 2019-08-18 ENCOUNTER — Inpatient Hospital Stay: Payer: Medicare HMO | Attending: Nurse Practitioner

## 2019-08-18 ENCOUNTER — Other Ambulatory Visit: Payer: Medicare Other

## 2019-08-18 ENCOUNTER — Inpatient Hospital Stay: Payer: Medicare HMO | Admitting: Nurse Practitioner

## 2019-08-18 ENCOUNTER — Other Ambulatory Visit: Payer: Self-pay

## 2019-08-18 ENCOUNTER — Encounter: Payer: Self-pay | Admitting: Nurse Practitioner

## 2019-08-18 VITALS — BP 146/89 | HR 72 | Temp 97.8°F | Resp 17 | Ht 74.0 in | Wt 273.5 lb

## 2019-08-18 DIAGNOSIS — G4733 Obstructive sleep apnea (adult) (pediatric): Secondary | ICD-10-CM | POA: Insufficient documentation

## 2019-08-18 DIAGNOSIS — D696 Thrombocytopenia, unspecified: Secondary | ICD-10-CM | POA: Insufficient documentation

## 2019-08-18 DIAGNOSIS — D472 Monoclonal gammopathy: Secondary | ICD-10-CM | POA: Diagnosis not present

## 2019-08-18 DIAGNOSIS — I10 Essential (primary) hypertension: Secondary | ICD-10-CM | POA: Diagnosis not present

## 2019-08-18 DIAGNOSIS — E0500 Thyrotoxicosis with diffuse goiter without thyrotoxic crisis or storm: Secondary | ICD-10-CM | POA: Insufficient documentation

## 2019-08-18 DIAGNOSIS — E7800 Pure hypercholesterolemia, unspecified: Secondary | ICD-10-CM | POA: Diagnosis not present

## 2019-08-18 DIAGNOSIS — M549 Dorsalgia, unspecified: Secondary | ICD-10-CM | POA: Insufficient documentation

## 2019-08-18 DIAGNOSIS — Z79899 Other long term (current) drug therapy: Secondary | ICD-10-CM | POA: Insufficient documentation

## 2019-08-18 DIAGNOSIS — E785 Hyperlipidemia, unspecified: Secondary | ICD-10-CM | POA: Insufficient documentation

## 2019-08-18 DIAGNOSIS — Z8673 Personal history of transient ischemic attack (TIA), and cerebral infarction without residual deficits: Secondary | ICD-10-CM | POA: Insufficient documentation

## 2019-08-18 DIAGNOSIS — E669 Obesity, unspecified: Secondary | ICD-10-CM | POA: Diagnosis not present

## 2019-08-18 LAB — CBC WITH DIFFERENTIAL (CANCER CENTER ONLY)
Abs Immature Granulocytes: 0.01 10*3/uL (ref 0.00–0.07)
Basophils Absolute: 0 10*3/uL (ref 0.0–0.1)
Basophils Relative: 0 %
Eosinophils Absolute: 0.3 10*3/uL (ref 0.0–0.5)
Eosinophils Relative: 5 %
HCT: 40 % (ref 39.0–52.0)
Hemoglobin: 13.5 g/dL (ref 13.0–17.0)
Immature Granulocytes: 0 %
Lymphocytes Relative: 32 %
Lymphs Abs: 1.9 10*3/uL (ref 0.7–4.0)
MCH: 30.7 pg (ref 26.0–34.0)
MCHC: 33.8 g/dL (ref 30.0–36.0)
MCV: 90.9 fL (ref 80.0–100.0)
Monocytes Absolute: 0.3 10*3/uL (ref 0.1–1.0)
Monocytes Relative: 6 %
Neutro Abs: 3.3 10*3/uL (ref 1.7–7.7)
Neutrophils Relative %: 57 %
Platelet Count: 128 10*3/uL — ABNORMAL LOW (ref 150–400)
RBC: 4.4 MIL/uL (ref 4.22–5.81)
RDW: 13.8 % (ref 11.5–15.5)
WBC Count: 5.8 10*3/uL (ref 4.0–10.5)
nRBC: 0 % (ref 0.0–0.2)

## 2019-08-18 LAB — CMP (CANCER CENTER ONLY)
ALT: 33 U/L (ref 0–44)
AST: 32 U/L (ref 15–41)
Albumin: 3.8 g/dL (ref 3.5–5.0)
Alkaline Phosphatase: 57 U/L (ref 38–126)
Anion gap: 8 (ref 5–15)
BUN: 16 mg/dL (ref 8–23)
CO2: 26 mmol/L (ref 22–32)
Calcium: 9.2 mg/dL (ref 8.9–10.3)
Chloride: 105 mmol/L (ref 98–111)
Creatinine: 1.06 mg/dL (ref 0.61–1.24)
GFR, Est AFR Am: >60 mL/min (ref >60)
GFR, Estimated: >60 mL/min (ref >60)
Glucose, Bld: 155 mg/dL — ABNORMAL HIGH (ref 70–99)
Potassium: 4 mmol/L (ref 3.5–5.1)
Sodium: 139 mmol/L (ref 135–145)
Total Bilirubin: 0.6 mg/dL (ref 0.3–1.2)
Total Protein: 7.3 g/dL (ref 6.5–8.1)

## 2019-08-18 NOTE — Progress Notes (Signed)
Gordon Phillips   Telephone:([phone] [phone]) [phone]   Clinic Follow up Note   Patient Care Team: Vivi Barrack, MD as PCP - General (Family Medicine) Buford Dresser, MD as PCP - Cardiology (Cardiology) Truitt Merle, MD as Consulting Physician (Hematology) [facility] as Consulting Physician (Ophthalmology) 08/18/2019  CHIEF COMPLAINT: F/u MGUS   CURRENT THERAPY: Observation  INTERVAL HISTORY: Gordon Phillips returns for f/u as scheduled. He was last seen in clinic in 08/2018. He started doing light exercise on elliptical and developed extreme left neck and shoulder pain the next day. This subsided but then developed left low leg pain, he thought he was having a blood clot and presented to ED on 10/7 CT cervical and lumbar spine showed moderate to severe neural foraminal stenosis. Doppler on 10/8 was negative for DVT. He continues to have chronic back pain, takes 500 mg aspirin PRN. Denies new pain. He is off eliquis. He has fatigue over the last 1.5 - 2 years. He started taking super B prostate for urinary symptoms and this helped his fatigue as well. Otherwise he feels fine. Denies bleeding or change in bowel habits. He had hematuria that was worked up by urology and "turned out to be nothing." Denies cough, chest pain, dyspnea, or leg swelling.   MEDICAL HISTORY:  Past Medical History:  Diagnosis Date  . ALLERGIC RHINITIS 01/25/2010  . Arthritis    "neck, back, shoulders," (02/20/2017)  . Chronic lower back pain   . High cholesterol   . Hx of cardiovascular stress test    a. Lexiscan Myoview 02/06/13:  EF 59%, no ischemia or scar  . Hx of echocardiogram    a.  Echo 02/03/13:  Mild LVH, mild focal basal septal hypertrophy, EF 50-55%, Gr 1 DD, mild LAE  . HYPERTENSION 01/25/2010  . HYPERTHYROIDISM 01/25/2010   S/P radioactive iodine "back in the 1980s" (02/20/2017)  . Impaired glucose tolerance   . MGUS (monoclonal gammopathy of unknown  significance)    "an autoimmune thing"  . Obesity   . OSA on CPAP    Uses CPAP nightly  . Pericarditis 1980's  . Stroke [facility])    Old L basal ganglia infarct by CT 01/2013.  Marland Kitchen Vitiligo     SURGICAL HISTORY: Past Surgical History:  Procedure Laterality Date  . A-FLUTTER ABLATION N/A 04/23/2019   Procedure: A-FLUTTER ABLATION;  Surgeon: Deboraha Sprang, MD;  Location: [place] CV LAB;  Service: Cardiovascular;  Laterality: N/A;  . CARDIOVERSION N/A 08/14/2018   Procedure: CARDIOVERSION;  Surgeon: [name], MD;  Location: [facility] ENDOSCOPY;  Service: Cardiovascular;  Laterality: N/A;  . INGUINAL HERNIA REPAIR Bilateral   . LUMBAR LAMINECTOMY    . TEE WITHOUT CARDIOVERSION N/A 08/14/2018   Procedure: TRANSESOPHAGEAL ECHOCARDIOGRAM (TEE);  Surgeon: [name], MD;  Location: [facility] ENDOSCOPY;  Service: Cardiovascular;  Laterality: N/A;  . UMBILICAL HERNIA REPAIR      I have reviewed the social history and family history with the patient and they are unchanged from previous note.  ALLERGIES:  is allergic to flomax [tamsulosin hcl]; relpax [eletriptan]; and other.  MEDICATIONS:  Current Outpatient Medications  Medication Sig Dispense Refill  . diltiazem (CARDIZEM CD) 120 MG 24 hr capsule Take 1 capsule (120 mg total) by mouth daily. 90 capsule 3  . doxazosin (CARDURA) 2 MG tablet Take 1 tablet (2 mg total) by mouth at bedtime. 90 tablet 3  . metoprolol succinate (TOPROL-XL) 100 MG 24 hr tablet Take 1  tablet (100 mg total) by mouth at bedtime. Take with or immediately following a meal. 90 tablet 3  . olmesartan (BENICAR) 40 MG tablet TAKE 1 TABLET BY MOUTH EVERY DAY 90 tablet 1  . pravastatin (PRAVACHOL) 40 MG tablet Take 1 tablet (40 mg total) by mouth at bedtime. 90 tablet 3  . acetaminophen (TYLENOL) 650 MG CR tablet Take 1,300 mg by mouth daily.    Marland Kitchen acetaminophen-codeine (TYLENOL #3) 300-30 MG tablet Take 1 tablet by mouth every 6 (six) hours as needed for moderate pain. 10 tablet 0   . Aromatic Inhalants (VICKS VAPOINHALER) INHA Place 1 puff into both nostrils as needed (for congestion or stuffiness).    Skipper Cliche SALINE NASAL DROPS NA Place 2 sprays into both nostrils as needed (for congestion).    . cyclobenzaprine (FLEXERIL) 5 MG tablet Take 1 tablet (5 mg total) by mouth 3 (three) times daily as needed. 10 tablet 0  . PRESCRIPTION MEDICATION CPAP: At bedtime    . sildenafil (VIAGRA) 100 MG tablet Take 100 mg by mouth daily as needed for erectile dysfunction.      No current facility-administered medications for this visit.     PHYSICAL EXAMINATION: ECOG PERFORMANCE STATUS: 0 - Asymptomatic  Vitals:   08/18/19 1447  BP: (!) 146/89  Pulse: 72  Resp: 17  Temp: 97.8 F (36.6 C)  SpO2: 94%   Filed Weights   08/18/19 1447  Weight: 273 lb 8 oz (124.1 kg)    GENERAL:alert, no distress and comfortable SKIN: vitiligo  EYES: sclera clear LYMPH:  no palpable cervical or supraclavicular lymphadenopathy  LUNGS: clear with normal breathing effort HEART: regular rate & rhythm, no lower extremity edema ABDOMEN: abdomen soft, non-tender and normal bowel sounds Musculoskeletal:no cyanosis of digits NEURO: alert & oriented x 3 with fluent speech, normal gait   LABORATORY DATA:  I have reviewed the data as listed CBC Latest Ref Rng & Units 08/18/2019 08/13/2019 04/18/2019  WBC 4.0 - 10.5 K/uL 5.8 6.8 5.9  Hemoglobin 13.0 - 17.0 g/dL 13.5 13.6 13.2  Hematocrit 39.0 - 52.0 % 40.0 40.0 40.1  Platelets 150 - 400 K/uL 128(L) 125(L) 128(L)     CMP Latest Ref Rng & Units 08/18/2019 08/13/2019 04/18/2019  Glucose 70 - 99 mg/dL 155(H) 70 77  BUN 8 - 23 mg/dL '16 18 14  ' Creatinine 0.61 - 1.24 mg/dL 1.06 1.08 1.00  Sodium 135 - 145 mmol/L 139 140 142  Potassium 3.5 - 5.1 mmol/L 4.0 3.9 4.5  Chloride 98 - 111 mmol/L 105 106 104  CO2 22 - 32 mmol/L '26 25 23  ' Calcium 8.9 - 10.3 mg/dL 9.2 9.5 9.2  Total Protein 6.5 - 8.1 g/dL 7.3 7.2 -  Total Bilirubin 0.3 - 1.2 mg/dL 0.6 0.9 -   Alkaline Phos 38 - 126 U/L 57 54 -  AST 15 - 41 U/L 32 25 -  ALT 0 - 44 U/L 33 20 -      RADIOGRAPHIC STUDIES: I have personally reviewed the radiological images as listed and agreed with the findings in the report. No results found.   ASSESSMENT & PLAN: 73 y.o. African-American male, with past medical history of Graves' disease, vitiligo, hypertension, dyslipidemia, was incidentally found a small hypodense lesion in C5 on CT scan. Lab work reviewed a monoclonal paraprotein.  1. MGUS -His initial SPEP reveal a low-level of monoclonal paraprotein (0.27g/dl), immunoglobulin level were normal, no anemia, renal dysfunction, or hypercalcemia -His previous CT scan revealed a small hyper  lytic lesion in the C5 vertebral body however was not obvious on the MRI scan. -A bone survey showed subtle lucencies in multiple bones, indeterminate.  -Bone marrow biopsy showed 5% plasma cells, suggestive of plasma cell dyscrasia. Cytogenetics was normal. -M protein has remained stable.  -Mr. Grandison appears well today. His CBC is stable with intermittent mild thrombocytopenia, no other cytopenias. CMP without renal dysfunction or hypercalcemia. -M protein and light chains are pending from today, will f/u -I think his fatigue is related to lack of exercise and chronic pain, I encouraged him to be active and try to lose weight with health diet. He will try -will continue monitoring labs q6 months, sooner if needed -f/u in 1 year if labs remain stable  3. Mild thrombocytopenia -The platelet count has been in the range of 120 to 153 since 2016 -He does have other autoimmune disease including Graves' disease and vitiligo.  - His bone marrow biopsy showed abundant megakaryocytes, some of which display atypical features, the significance is uncertain. -PLT 128K today, stable overall. Will continue monitoring   4. Chronic back pain  -His lumbar MRI in March 2016 showed severe degenerative changes, which is  likely the cause of his back pain. -His back pain worsened lately after exercise and he went to ED, CT lumbar and cervical spine on 08/13/19 shows moderate to severe neural foraminal stenosis -he plans to f/u with ortho   5. Fatigue -possible relate to his back pain and lack of exercise -I encouraged him to increase exercise, eat well and try to lose weight  -he started taking Beta prostate which helped fatigue and urinary symptoms  6. Hypertension, dyslipidemia -f/u PCP  7. Cancer screening  -per PCP -colonoscopy done 05/2018  -PSA normal on 03/26/19   PLAN: -CBC and CMP stable -M protein and light chains pending, will f/u  -Lab in 6 months -F/u annually or sooner if needed   All questions were answered. The patient knows to call the clinic with any problems, questions or concerns. No barriers to learning was detected. I spent 20 minutes counseling the patient face to face. The total time spent in the appointment was 25 minutes and more than 50% was on counseling and review of test results     Alla Feeling, NP 08/18/19

## 2019-08-19 ENCOUNTER — Telehealth: Payer: Self-pay | Admitting: Hematology

## 2019-08-19 LAB — KAPPA/LAMBDA LIGHT CHAINS
Kappa free light chain: 14.6 mg/L (ref 3.3–19.4)
Kappa, lambda light chain ratio: 1.47 (ref 0.26–1.65)
Lambda free light chains: 9.9 mg/L (ref 5.7–26.3)

## 2019-08-19 NOTE — Telephone Encounter (Signed)
Scheduled appt per 10/12 los. ° °Sent a staff message to get a calendar mailed out. °

## 2019-08-20 LAB — MULTIPLE MYELOMA PANEL, SERUM
Albumin SerPl Elph-Mcnc: 4 g/dL (ref 2.9–4.4)
Albumin/Glob SerPl: 1.3 (ref 0.7–1.7)
Alpha 1: 0.2 g/dL (ref 0.0–0.4)
Alpha2 Glob SerPl Elph-Mcnc: 0.9 g/dL (ref 0.4–1.0)
B-Globulin SerPl Elph-Mcnc: 0.9 g/dL (ref 0.7–1.3)
Gamma Glob SerPl Elph-Mcnc: 1.2 g/dL (ref 0.4–1.8)
Globulin, Total: 3.1 g/dL (ref 2.2–3.9)
IgA: 120 mg/dL (ref 61–437)
IgG (Immunoglobin G), Serum: 1255 mg/dL (ref 603–1613)
IgM (Immunoglobulin M), Srm: 55 mg/dL (ref 15–143)
M Protein SerPl Elph-Mcnc: 0.4 g/dL — ABNORMAL HIGH
Total Protein ELP: 7.1 g/dL (ref 6.0–8.5)

## 2019-08-26 ENCOUNTER — Encounter: Payer: Self-pay | Admitting: Family Medicine

## 2019-08-26 ENCOUNTER — Other Ambulatory Visit: Payer: Self-pay

## 2019-08-26 ENCOUNTER — Ambulatory Visit (INDEPENDENT_AMBULATORY_CARE_PROVIDER_SITE_OTHER): Payer: Medicare HMO | Admitting: Family Medicine

## 2019-08-26 VITALS — BP 110/60 | HR 69 | Temp 97.7°F | Ht 74.0 in | Wt 280.0 lb

## 2019-08-26 DIAGNOSIS — N401 Enlarged prostate with lower urinary tract symptoms: Secondary | ICD-10-CM | POA: Diagnosis not present

## 2019-08-26 DIAGNOSIS — I10 Essential (primary) hypertension: Secondary | ICD-10-CM | POA: Diagnosis not present

## 2019-08-26 DIAGNOSIS — Z23 Encounter for immunization: Secondary | ICD-10-CM

## 2019-08-26 DIAGNOSIS — M5416 Radiculopathy, lumbar region: Secondary | ICD-10-CM

## 2019-08-26 DIAGNOSIS — E7800 Pure hypercholesterolemia, unspecified: Secondary | ICD-10-CM | POA: Diagnosis not present

## 2019-08-26 DIAGNOSIS — R7302 Impaired glucose tolerance (oral): Secondary | ICD-10-CM | POA: Diagnosis not present

## 2019-08-26 DIAGNOSIS — Z0001 Encounter for general adult medical examination with abnormal findings: Secondary | ICD-10-CM

## 2019-08-26 LAB — LIPID PANEL
Cholesterol: 145 mg/dL (ref 0–200)
HDL: 40.4 mg/dL (ref >39.00)
LDL Cholesterol: 91 mg/dL (ref 0–99)
NonHDL: 104.19
Total CHOL/HDL Ratio: 4
Triglycerides: 68 mg/dL (ref 0.0–149.0)
VLDL: 13.6 mg/dL (ref 0.0–40.0)

## 2019-08-26 LAB — TSH: TSH: 0.67 u[IU]/mL (ref 0.35–4.50)

## 2019-08-26 LAB — HEMOGLOBIN A1C: Hgb A1c MFr Bld: 6.5 % (ref 4.6–6.5)

## 2019-08-26 MED ORDER — ACETAMINOPHEN-CODEINE #3 300-30 MG PO TABS: 1.0000 | ORAL_TABLET | Freq: Four times a day (QID) | ORAL | 0 refills | Status: DC | PRN

## 2019-08-26 NOTE — Assessment & Plan Note (Signed)
Continue management per urology.  Continue Cardura 2 mg daily and Viagra 100 mg as needed.

## 2019-08-26 NOTE — Progress Notes (Signed)
Chief Complaint:  Gordon Phillips is a 73 y.o. male who presents today for his annual comprehensive physical exam.    Assessment/Plan:  BPH (benign prostatic hyperplasia) Continue management per urology.  Continue Cardura 2 mg daily and Viagra 100 mg as needed.  Essential hypertension At goal.  Continue losartan 1 mg daily, metoprolol 100 mg daily, and diltiazem 120 mg daily.  HLD (hyperlipidemia) Check lipid panel.  Continue pravastatin 40 mg daily.  Lumbar radiculopathy No red flags.  Will refill Tylenol 3.  Database with no red flags.  He will follow up with orthopedics soon.  Impaired Fasting Glucose Check A1c.  Body mass index is 35.95 kg/m. / Obese    Preventative Healthcare: Flu vaccine given today. Check lipid panel and A1c.   Patient Counseling(The following topics were reviewed and/or handout was given):  -Nutrition: Stressed importance of moderation in sodium/caffeine intake, saturated fat and cholesterol, caloric balance, sufficient intake of fresh fruits, vegetables, and fiber.  -Stressed the importance of regular exercise.   -Substance Abuse: Discussed cessation/primary prevention of tobacco, alcohol, or other drug use; driving or other dangerous activities under the influence; availability of treatment for abuse.   -Injury prevention: Discussed safety belts, safety helmets, smoke detector, smoking near bedding or upholstery.   -Sexuality: Discussed sexually transmitted diseases, partner selection, use of condoms, avoidance of unintended pregnancy and contraceptive alternatives.   -Dental health: Discussed importance of regular tooth brushing, flossing, and dental visits.  -Health maintenance and immunizations reviewed. Please refer to Health maintenance section.  Return to care in 1 year for next preventative visit.     Subjective:  HPI:  He has no acute complaints today.   Patient was seen in the ED 2 weeks ago with left low back pain.  Had CT scan which  showed neuroforaminal stenosis.  He was diagnosed with lumbar radiculopathy.  He was started on Tylenol 3.  He will follow-up with orthopedics.  He requests refill on Tylenol 3 today.  His stable, chronic medical conditions are outlined below:   #Essential hypertension - On olmesartan 40 mg daily, diltiazem 120 mg daily and metoprolol 100 mg daily - ROS: No reported SI or HI  #Dyslipidemia - On pravastatin 40 mg daily.  Tolerating well - ROS: No reported myalgias  #BPH/ED - Follows with urology - On Viagra 100 mg daily and doxazosin 2 mg daily at bedtime.  Tolerating both well.  % Atrial Fibrillation - Follows with cardiology  Lifestyle Diet: Trying to work on portion control.  Exercise: Trying to work on elliptical more.   Depression screen PHQ 2/9 08/20/2018  Decreased Interest 0  Down, Depressed, Hopeless 0  PHQ - 2 Score 0   ROS: Per HPI, otherwise a complete review of systems was negative.   PMH:  The following were reviewed and entered/updated in epic: Past Medical History:  Diagnosis Date  . ALLERGIC RHINITIS 01/25/2010  . Arthritis    "neck, back, shoulders," (02/20/2017)  . Chronic lower back pain   . High cholesterol   . Hx of cardiovascular stress test    a. Lexiscan Myoview 02/06/13:  EF 59%, no ischemia or scar  . Hx of echocardiogram    a.  Echo 02/03/13:  Mild LVH, mild focal basal septal hypertrophy, EF 50-55%, Gr 1 DD, mild LAE  . HYPERTENSION 01/25/2010  . HYPERTHYROIDISM 01/25/2010   S/P radioactive iodine "back in the 1980s" (02/20/2017)  . Impaired glucose tolerance   . 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 Obesity   . OSA on CPAP    Uses CPAP nightly  . Pericarditis 1980's  . 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  Marland Kitchen Vitiligo    Patient Active Problem List   Diagnosis Date Noted  . Deviated septum 08/20/2018  . BPH (benign prostatic hyperplasia) 08/20/2018  . Anal irritation  08/20/2018  . Atrial flutter ([place]) 08/12/2018  . HLD (hyperlipidemia) 02/20/2017  . History of CVA (cerebrovascular accident) 02/20/2017  . Back pain, chronic 11/22/2015  . MGUS (monoclonal gammopathy of unknown significance) 01/21/2015  . OSA (obstructive sleep apnea) 03/16/2014  . Impaired glucose tolerance 05/27/2012  . Thyrotoxicosis 01/25/2010  . Essential hypertension 01/25/2010  . Allergic rhinitis 01/25/2010   Past Surgical History:  Procedure Laterality Date  . A-FLUTTER ABLATION N/A 04/23/2019   Procedure: A-FLUTTER ABLATION;  Surgeon: Deboraha Sprang, MD;  Location: [facility];  Service: Cardiovascular;  Laterality: N/A;  . CARDIOVERSION N/A 08/14/2018   Procedure: CARDIOVERSION;  Surgeon: [name], MD;  Location: [facility] ENDOSCOPY;  Service: Cardiovascular;  Laterality: N/A;  . INGUINAL HERNIA REPAIR Bilateral   . LUMBAR LAMINECTOMY    . TEE WITHOUT CARDIOVERSION N/A 08/14/2018   Procedure: TRANSESOPHAGEAL ECHOCARDIOGRAM (TEE);  Surgeon: [name], MD;  Location: [facility] ENDOSCOPY;  Service: Cardiovascular;  Laterality: N/A;  . UMBILICAL HERNIA REPAIR      Family History  Problem Relation Age of Onset  . Stroke Father        Passed away in his 1s  . Thyroid disease Sister        goiter  . Sudden death Brother        ? Drug use, no autopsy  . Throat cancer Brother   . Colon cancer Neg Hx   . Esophageal cancer Neg Hx   . Rectal cancer Neg Hx   . Stomach cancer Neg Hx     Medications- reviewed and updated Current Outpatient Medications  Medication Sig Dispense Refill  . acetaminophen (TYLENOL) 650 MG CR tablet Take 1,300 mg by mouth daily.    Marland Kitchen acetaminophen-codeine (TYLENOL #3) 300-30 MG tablet Take 1 tablet by mouth every 6 (six) hours as needed for moderate pain. 10 tablet 0  . Aromatic Inhalants (VICKS VAPOINHALER) INHA Place 1 puff into both nostrils as needed (for congestion or stuffiness).    Skipper Cliche SALINE NASAL DROPS NA Place 2 sprays into both  nostrils as needed (for congestion).    . cyclobenzaprine (FLEXERIL) 5 MG tablet Take 1 tablet (5 mg total) by mouth 3 (three) times daily as needed. 10 tablet 0  . diltiazem (CARDIZEM CD) 120 MG 24 hr capsule Take 1 capsule (120 mg total) by mouth daily. 90 capsule 3  . doxazosin (CARDURA) 2 MG tablet Take 1 tablet (2 mg total) by mouth at bedtime. 90 tablet 3  . metoprolol succinate (TOPROL-XL) 100 MG 24 hr tablet Take 1 tablet (100 mg total) by mouth at bedtime. Take with or immediately following a meal. 90 tablet 3  . olmesartan (BENICAR) 40 MG tablet TAKE 1 TABLET BY MOUTH EVERY DAY 90 tablet 1  . pravastatin (PRAVACHOL) 40 MG tablet Take 1 tablet (40 mg total) by mouth at bedtime. 90 tablet 3  . PRESCRIPTION MEDICATION CPAP: At bedtime    . sildenafil (VIAGRA) 100 MG tablet Take 100 mg by mouth daily as needed for erectile dysfunction.      No current facility-administered medications  for this visit.     Allergies-reviewed and updated Allergies  Allergen Reactions  . Flomax [Tamsulosin Hcl] Itching  . Relpax [Eletriptan] Other (See Comments) and Hypertension    Chest pain and sweating  . Other Palpitations    Any steroids cause heart palpitations     Social History   Socioeconomic History  . Marital status: Married    Spouse name: Not on file  . Number of children: 2  . Years of education: Not on file  . Highest education level: Not on file  Occupational History  . Occupation: Geographical information systems officer  . Financial resource strain: Not on file  . Food insecurity    Worry: Not on file    Inability: Not on file  . Transportation needs    Medical: Not on file    Non-medical: Not on file  Tobacco Use  . Smoking status: Former Smoker    Packs/day: 0.50    Years: 8.00    Pack years: 4.00    Types: Cigarettes    Quit date: 1968    Years since quitting: 52.8  . Smokeless tobacco: Never Used  Substance and Sexual Activity  . Alcohol use: Yes    Comment: 02/20/2017 "couple  drinks/month on average"  . Drug use: No  . Sexual activity: Yes  Lifestyle  . Physical activity    Days per week: Not on file    Minutes per session: Not on file  . Stress: Not on file  Relationships  . Social Herbalist on phone: Not on file    Gets together: Not on file    Attends religious service: Not on file    Active member of club or organization: Not on file    Attends meetings of clubs or organizations: Not on file    Relationship status: Not on file  Other Topics Concern  . Not on file  Social History Narrative  . Not on file        Objective:  Physical Exam: BP 110/60   Pulse 69   Temp 97.7 F (36.5 C)   Ht 6\' 2"  (1.88 m)   Wt 280 lb (127 kg)   SpO2 98%   BMI 35.95 kg/m   Body mass index is 35.95 kg/m. Wt Readings from Last 3 Encounters:  08/26/19 280 lb (127 kg)  08/18/19 273 lb 8 oz (124.1 kg)  07/03/19 276 lb 3.2 oz (125.3 kg)   Gen: NAD, resting comfortably HEENT: TMs normal bilaterally. OP clear. No thyromegaly noted.  CV: RRR with no murmurs appreciated Pulm: NWOB, CTAB with no crackles, wheezes, or rhonchi GI: Normal bowel sounds present. Soft, Nontender, Nondistended. MSK: no edema, cyanosis, or clubbing noted Skin: warm, dry Neuro: CN2-12 grossly intact. Strength 5/5 in upper and lower extremities. Reflexes symmetric and intact bilaterally.  Psych: Normal affect and thought content     Lilliah Priego M. [name], MD 08/26/2019 2:23 PM

## 2019-08-26 NOTE — Assessment & Plan Note (Signed)
At goal.  Continue losartan 1 mg daily, metoprolol 100 mg daily, and diltiazem 120 mg daily.

## 2019-08-26 NOTE — Patient Instructions (Signed)
It was very nice to see you today!  Keep up the good work!  We will check blood work.  I will send in a refill for your medication.  Come back in 1 year for your next physical, or sooner if needed   Take care, Dr [name]  Please try these tips to maintain a healthy lifestyle:   Eat at least 3 REAL meals and 1-2 snacks per day.  Aim for no more than 5 hours between eating.  If you eat breakfast, please do so within one hour of getting up.    Obtain twice as many fruits/vegetables as protein or carbohydrate foods for both lunch and dinner. (Half of each meal should be fruits/vegetables, one quarter protein, and one quarter starchy carbs)   Cut down on sweet beverages. This includes juice, soda, and sweet tea.    Exercise at least 150 minutes every week.    Preventive Care 11 Years and Older, Male Preventive care refers to lifestyle choices and visits with your health care provider that can promote health and wellness. This includes:  A yearly physical exam. This is also called an annual well check.  Regular dental and eye exams.  Immunizations.  Screening for certain conditions.  Healthy lifestyle choices, such as diet and exercise. What can I expect for my preventive care visit? Physical exam Your health care provider will check:  Height and weight. These may be used to calculate body mass index (BMI), which is a measurement that tells if you are at a healthy weight.  Heart rate and blood pressure.  Your skin for abnormal spots. Counseling Your health care provider may ask you questions about:  Alcohol, tobacco, and drug use.  Emotional well-being.  Home and relationship well-being.  Sexual activity.  Eating habits.  History of falls.  Memory and ability to understand (cognition).  Work and work Statistician. What immunizations do I need?  Influenza (flu) vaccine  This is recommended every year. Tetanus, diphtheria, and pertussis (Tdap) vaccine   You may need a Td booster every 10 years. Varicella (chickenpox) vaccine  You may need this vaccine if you have not already been vaccinated. Zoster (shingles) vaccine  You may need this after age 61. Pneumococcal conjugate (PCV13) vaccine  One dose is recommended after age 31. Pneumococcal polysaccharide (PPSV23) vaccine  One dose is recommended after age 36. Measles, mumps, and rubella (MMR) vaccine  You may need at least one dose of MMR if you were born in 1957 or later. You may also need a second dose. Meningococcal conjugate (MenACWY) vaccine  You may need this if you have certain conditions. Hepatitis A vaccine  You may need this if you have certain conditions or if you travel or work in places where you may be exposed to hepatitis A. Hepatitis B vaccine  You may need this if you have certain conditions or if you travel or work in places where you may be exposed to hepatitis B. Haemophilus influenzae type b (Hib) vaccine  You may need this if you have certain conditions. You may receive vaccines as individual doses or as more than one vaccine together in one shot (combination vaccines). Talk with your health care provider about the risks and benefits of combination vaccines. What tests do I need? Blood tests  Lipid and cholesterol levels. These may be checked every 5 years, or more frequently depending on your overall health.  Hepatitis C test.  Hepatitis B test. Screening  Lung cancer screening. You may have  this screening every year starting at age 57 if you have a 30-pack-year history of smoking and currently smoke or have quit within the past 15 years.  Colorectal cancer screening. All adults should have this screening starting at age 71 and continuing until age 58. Your health care provider may recommend screening at age 76 if you are at increased risk. You will have tests every 1-10 years, depending on your results and the type of screening test.  Prostate cancer  screening. Recommendations will vary depending on your family history and other risks.  Diabetes screening. This is done by checking your blood sugar (glucose) after you have not eaten for a while (fasting). You may have this done every 1-3 years.  Abdominal aortic aneurysm (AAA) screening. You may need this if you are a current or former smoker.  Sexually transmitted disease (STD) testing. Follow these instructions at home: Eating and drinking  Eat a diet that includes fresh fruits and vegetables, whole grains, lean protein, and low-fat dairy products. Limit your intake of foods with high amounts of sugar, saturated fats, and salt.  Take vitamin and mineral supplements as recommended by your health care provider.  Do not drink alcohol if your health care provider tells you not to drink.  If you drink alcohol: ? Limit how much you have to 0-2 drinks a day. ? Be aware of how much alcohol is in your drink. In the [place] one drink equals one 12 oz bottle of beer (355 mL), one 5 oz glass of wine (148 mL), or one 1 oz glass of hard liquor (44 mL). Lifestyle  Take daily care of your teeth and gums.  Stay active. Exercise for at least 30 minutes on 5 or more days each week.  Do not use any products that contain nicotine or tobacco, such as cigarettes, e-cigarettes, and chewing tobacco. If you need help quitting, ask your health care provider.  If you are sexually active, practice safe sex. Use a condom or other form of protection to prevent STIs (sexually transmitted infections).  Talk with your health care provider about taking a low-dose aspirin or statin. What's next?  Visit your health care provider once a year for a well check visit.  Ask your health care provider how often you should have your eyes and teeth checked.  Stay up to date on all vaccines. This information is not intended to replace advice given to you by your health care provider. Make sure you discuss any questions  you have with your health care provider. Document Released: 11/19/2015 Document Revised: 10/17/2018 Document Reviewed: 10/17/2018 Elsevier Patient Education  2020 [employer].

## 2019-08-26 NOTE — Assessment & Plan Note (Signed)
Check lipid panel.  Continue pravastatin 40 mg daily. 

## 2019-08-28 NOTE — Progress Notes (Signed)
Please inform patient of the following:  His blood sugar is borderline diabetic, but all of his other labs are NORMAL. He would benefit form starting metformin 500mg  daily to improve blood sugar and reduce risk of heart attack and stroke. Please send in if patient is willing to start. Regardless, he should continue to work on diet and exercise and we can recheck in 6 months.  Algis Greenhouse. [name], MD 08/28/2019 10:10 AM

## 2019-09-01 ENCOUNTER — Telehealth: Payer: Self-pay

## 2019-09-01 ENCOUNTER — Other Ambulatory Visit: Payer: Self-pay

## 2019-09-01 MED ORDER — METFORMIN HCL 500 MG PO TABS: 500.0000 mg | ORAL_TABLET | Freq: Every day | ORAL | 1 refills | Status: DC

## 2019-09-01 NOTE — Telephone Encounter (Signed)
Patient called for his M-protein result.  It was 0.4 the same as 6 months ago.  He verbalized an understanding.

## 2019-09-03 ENCOUNTER — Telehealth: Payer: Self-pay | Admitting: Family Medicine

## 2019-09-03 ENCOUNTER — Telehealth: Payer: Self-pay

## 2019-09-03 NOTE — Telephone Encounter (Signed)
Please advise 

## 2019-09-03 NOTE — Telephone Encounter (Signed)
Copied from [place] [id number]. Topic: Referral - Request for Referral >> Sep 03, 2019  4:01 PM Alease Frame wrote: Has patient seen PCP for this complaint? Yes  If NO, is insurance requiring patient see PCP for this issue before PCP can refer them? Referral for which specialty:  Dr Altha Harm from [facility]  Preferred provider/office: Dr [name] of [place] Reason for referral: Patient wife called in to have patient seen

## 2019-09-04 ENCOUNTER — Other Ambulatory Visit: Payer: Self-pay

## 2019-09-04 DIAGNOSIS — E0580 Other thyrotoxicosis without thyrotoxic crisis or storm: Secondary | ICD-10-CM

## 2019-09-04 DIAGNOSIS — R7302 Impaired glucose tolerance (oral): Secondary | ICD-10-CM

## 2019-09-04 NOTE — Telephone Encounter (Signed)
That is ok with me.  Algis Greenhouse. [name], MD 09/04/2019 9:02 AM

## 2019-09-04 NOTE — Telephone Encounter (Signed)
Referral has been placed. 

## 2019-09-11 ENCOUNTER — Other Ambulatory Visit: Payer: Self-pay

## 2019-09-11 DIAGNOSIS — Z20822 Contact with and (suspected) exposure to covid-19: Secondary | ICD-10-CM

## 2019-09-12 DIAGNOSIS — M542 Cervicalgia: Secondary | ICD-10-CM | POA: Diagnosis not present

## 2019-09-12 DIAGNOSIS — M545 Low back pain: Secondary | ICD-10-CM | POA: Diagnosis not present

## 2019-09-12 LAB — NOVEL CORONAVIRUS, NAA: SARS-CoV-2, NAA: NOT DETECTED

## 2019-09-19 ENCOUNTER — Encounter: Payer: Self-pay | Admitting: Internal Medicine

## 2019-09-19 ENCOUNTER — Other Ambulatory Visit: Payer: Self-pay

## 2019-09-19 ENCOUNTER — Ambulatory Visit (INDEPENDENT_AMBULATORY_CARE_PROVIDER_SITE_OTHER): Payer: Medicare HMO | Admitting: Internal Medicine

## 2019-09-19 VITALS — BP 110/60 | HR 70 | Ht 74.0 in | Wt 280.0 lb

## 2019-09-19 DIAGNOSIS — R7303 Prediabetes: Secondary | ICD-10-CM | POA: Insufficient documentation

## 2019-09-19 DIAGNOSIS — Z8639 Personal history of other endocrine, nutritional and metabolic disease: Secondary | ICD-10-CM | POA: Diagnosis not present

## 2019-09-19 DIAGNOSIS — E119 Type 2 diabetes mellitus without complications: Secondary | ICD-10-CM | POA: Insufficient documentation

## 2019-09-19 MED ORDER — GLUCOSE BLOOD VI STRP: ORAL_STRIP | 12 refills | Status: DC

## 2019-09-19 NOTE — Progress Notes (Signed)
Patient ID: Gordon Phillips, male   DOB: Mar 26, 1946, 73 y.o.   MRN: [id number]   HPI: Gordon Phillips is a 73 y.o.-year-old male, referred by his PCP, Dr. [name], for management of prediabetes, dx in ~2013.  His wife, Gordon Phillips, is also my patient.  Patient has a history of impaired glucose tolerance/prediabetes, but he was found to have an HbA1c in the diabetic range at last check in 08/2019.   He tells me that he knows why his HbA1c was higher: he was working part-time, but stopped working at the start of the coronavirus pandemic and started to eat more.   After the HbA1c returned high >> he started to change his diet: reduced bread, stopped sweets, increased fruit and veggies. He also tried to restart exercise but hurt his back so he is not taking it slowly.  After the HbA1c returned higher, he was started on Metformin 500 mg daily by PCP but he could not tolerate it due to GI symptoms >> abdominal pain and diarrhea.  He would like to avoid medications if possible.  Reviewed latest HbA1c level: Lab Results  Component Value Date   HGBA1C 6.5 08/26/2019   HGBA1C 6.1 (H) 02/03/2013   HGBA1C 6.2 05/27/2012   He is currently not on any medications for prediabetes now.  He does not have a glucometer.  Pt is not checking sugars at home. It is unclear at which level he has hypoglycemia awareness.  Pt's meals are: - Breakfast: oatmeal + fruit (banana + grapes) 3x a week; eggs + grits + bacon/sausage; tuna sandwich or tuna salad; eggs + sardines + toast; granola bar - Lunch: skips or peanuts or PB crackers; [employer] - Dinner:  Salmon or pork chop + veggies + rice or potatoes - Snacks: stopped icecream, juice (he was drinking a lot of this before), grapes, cookies  -+ Mild CKD, last BUN/creatinine:  Lab Results  Component Value Date   BUN 16 08/18/2019   BUN 18 08/13/2019   CREATININE 1.06 08/18/2019   CREATININE 1.08 08/13/2019  On Benicar 40 mg daily.  -+ HL; last set of lipids: Lab  Results  Component Value Date   CHOL 145 08/26/2019   HDL 40.40 08/26/2019   LDLCALC 91 08/26/2019   LDLDIRECT 139.2 11/21/2012   TRIG 68.0 08/26/2019   CHOLHDL 4 08/26/2019  On pravastatin 40 mg daily.  - last eye exam was in 2020. No DR.   - no numbness and tingling in his feet.  Pt has no FH of DM.  He has a history of stroke (left basal ganglia infarct) in 01/2013 -previously on Eliquis, now off.  He also has MGUS, vitiligo, h/o pericarditis.  He also has a history of Graves ds., s/p RAI tx in the 64s.  Reviewed latest TSH levels and these were normal: Lab Results  Component Value Date   TSH 0.67 08/26/2019   TSH 1.442 08/12/2018   TSH 0.57 09/18/2017   TSH 0.65 03/20/2016   TSH 0.69 03/02/2015   He is on Toprol-XL 100 mg daily.  ROS: Constitutional: + weight gain, no weight loss, + fatigue x last 2 years, no subjective hyperthermia, no subjective hypothermia, no nocturia Eyes: no blurry vision, no xerophthalmia ENT: no sore throat, no nodules palpated in neck, no dysphagia, no odynophagia, no hoarseness, no tinnitus, no hypoacusis Cardiovascular: no CP, no SOB, no palpitations, no leg swelling Respiratory: no cough, no SOB, no wheezing Gastrointestinal: no N, no V, no D, no C, no acid reflux  Musculoskeletal: no muscle, + joint aches Skin: no rash, no hair loss Neurological: no tremors, no numbness or tingling/no dizziness/no HAs Psychiatric: no depression, no anxiety  Past Medical History:  Diagnosis Date  . ALLERGIC RHINITIS 01/25/2010  . Arthritis    "neck, back, shoulders," (02/20/2017)  . Chronic lower back pain   . High cholesterol   . Hx of cardiovascular stress test    a. Lexiscan Myoview 02/06/13:  EF 59%, no ischemia or scar  . Hx of echocardiogram    a.  Echo 02/03/13:  Mild LVH, mild focal basal septal hypertrophy, EF 50-55%, Gr 1 DD, mild LAE  . HYPERTENSION 01/25/2010  . HYPERTHYROIDISM 01/25/2010   S/P radioactive iodine "back in the 1980s"  (02/20/2017)  . Impaired glucose tolerance   . 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 Obesity   . OSA on CPAP    Uses CPAP nightly  . Pericarditis 1980's  . Stroke [facility])    Old L basal ganglia infarct by CT 01/2013.  Marland Kitchen Vitiligo    Past Surgical History:  Procedure Laterality Date  . A-FLUTTER ABLATION N/A 04/23/2019   Procedure: A-FLUTTER ABLATION;  Surgeon: Deboraha Sprang, MD;  Location: [place] [facility];  Service: Cardiovascular;  Laterality: N/A;  . CARDIOVERSION N/A 08/14/2018   Procedure: CARDIOVERSION;  Surgeon: [name], MD;  Location: [facility];  Service: Cardiovascular;  Laterality: N/A;  . INGUINAL HERNIA REPAIR Bilateral   . LUMBAR LAMINECTOMY    . TEE WITHOUT CARDIOVERSION N/A 08/14/2018   Procedure: TRANSESOPHAGEAL ECHOCARDIOGRAM (TEE);  Surgeon: [name], MD;  Location: [facility];  Service: Cardiovascular;  Laterality: N/A;  . UMBILICAL HERNIA REPAIR     Social History   Socioeconomic History  . Marital status: Married    Spouse name: Not on file  . Number of children: 2  . Years of education: Not on file  . Highest education level: Not on file  Occupational History  . Occupation: Geographical information systems officer  . Financial resource strain: Not on file  . Food insecurity    Worry: Not on file    Inability: Not on file  . Transportation needs    Medical: Not on file    Non-medical: Not on file  Tobacco Use  . Smoking status: Former Smoker    Packs/day: 0.50    Years: 8.00    Pack years: 4.00    Types: Cigarettes    Quit date: 1968    Years since quitting: 52.9  . Smokeless tobacco: Never Used  Substance and Sexual Activity  . Alcohol use: Yes    Comment: 02/20/2017 "couple drinks/month on average"  . Drug use: No  . Sexual activity: Yes  Lifestyle  . Physical activity    Days per week: Not on file    Minutes per session: Not on file  . Stress: Not on file  Relationships  . Social Product manager on phone: Not on file    Gets together: Not on file    Attends religious service: Not on file    Active member of club or organization: Not on file    Attends meetings of clubs or organizations: Not on file    Relationship status: Not on file  . Intimate partner violence    Fear of current or ex partner: Not on file    Emotionally abused: Not on file    Physically abused: Not on file  Forced sexual activity: Not on file  Other Topics Concern  . Not on file  Social History Narrative  . Not on file   Current Outpatient Medications on File Prior to Visit  Medication Sig Dispense Refill  . acetaminophen (TYLENOL) 650 MG CR tablet Take 1,300 mg by mouth daily.    Marland Kitchen acetaminophen-codeine (TYLENOL #3) 300-30 MG tablet Take 1 tablet by mouth every 6 (six) hours as needed for moderate pain. 10 tablet 0  . Aromatic Inhalants (VICKS VAPOINHALER) INHA Place 1 puff into both nostrils as needed (for congestion or stuffiness).    Skipper Cliche SALINE NASAL DROPS NA Place 2 sprays into both nostrils as needed (for congestion).    . cyclobenzaprine (FLEXERIL) 5 MG tablet Take 1 tablet (5 mg total) by mouth 3 (three) times daily as needed. 10 tablet 0  . diltiazem (CARDIZEM CD) 120 MG 24 hr capsule Take 1 capsule (120 mg total) by mouth daily. 90 capsule 3  . doxazosin (CARDURA) 2 MG tablet Take 1 tablet (2 mg total) by mouth at bedtime. 90 tablet 3  . metoprolol succinate (TOPROL-XL) 100 MG 24 hr tablet Take 1 tablet (100 mg total) by mouth at bedtime. Take with or immediately following a meal. 90 tablet 3  . olmesartan (BENICAR) 40 MG tablet TAKE 1 TABLET BY MOUTH EVERY DAY 90 tablet 1  . pravastatin (PRAVACHOL) 40 MG tablet Take 1 tablet (40 mg total) by mouth at bedtime. 90 tablet 3  . PRESCRIPTION MEDICATION CPAP: At bedtime    . sildenafil (VIAGRA) 100 MG tablet Take 100 mg by mouth daily as needed for erectile dysfunction.      No current facility-administered medications on file prior to visit.     Allergies  Allergen Reactions  . Flomax [Tamsulosin Hcl] Itching  . Relpax [Eletriptan] Other (See Comments) and Hypertension    Chest pain and sweating  . Other Palpitations    Any steroids cause heart palpitations    Family History  Problem Relation Age of Onset  . Stroke Father        Passed away in his 41s  . Thyroid disease Sister        goiter  . Sudden death Brother        ? Drug use, no autopsy  . Throat cancer Brother   . Colon cancer Neg Hx   . Esophageal cancer Neg Hx   . Rectal cancer Neg Hx   . Stomach cancer Neg Hx    PE: BP 110/60   Pulse 70   Ht 6\' 2"  (1.88 m)   Wt 280 lb (127 kg)   SpO2 97%   BMI 35.95 kg/m  Wt Readings from Last 3 Encounters:  09/19/19 280 lb (127 kg)  08/26/19 280 lb (127 kg)  08/18/19 273 lb 8 oz (124.1 kg)   Constitutional: obese, in NAD Eyes: PERRLA, EOMI, no exophthalmos ENT: moist mucous membranes, no thyromegaly, no cervical lymphadenopathy Cardiovascular: RRR, No MRG Respiratory: CTA B Gastrointestinal: abdomen soft, NT, ND, BS+ Musculoskeletal: no deformities, strength intact in all 4 Skin: moist, warm, no rashes Neurological: no tremor with outstretched hands, DTR normal in all 4  ASSESSMENT: 1.  Prediabetes, with new HbA1c in the diabetic range  2.  History of Graves' disease  PLAN:  1. Patient with newly increased HbA1c in the diabetic range, after several years of being in the prediabetic range.  His PCP tried Metformin but he could not tolerate even a low dose. -Patient tells  me that he is aware of the reason why his sugars increased.  He stopped working at the beginning of the coronavirus pandemic and relax his diet.  Since then, after his HbA1c returned at 6.5%, which we discussed is in the diabetic range, he started to change his diet and eliminate sweets.  He is also not eating as much bread as before.  Importantly, he eliminated juice.  He admits that he was drinking quite a bit of this daily.  He would like  to avoid medications for now, and I agree.  If he continues to improve his diet and also if he starts exercise, we may not need pharmaceutical interventions for now. -At this visit, we discussed about improving his diet and I made specific suggestions.  For now we will hold off referral to nutrition per his preference. -I explained that we need to have to HbA1c values in the diabetic range to give him a diagnosis of diabetes.  I would like to recheck this at next visit, but for now, his diagnosis is prediabetes.  This is reversible with dietary interventions. -We gave him a One Touch verio meter and sent supplies to his pharmacy. - I suggested to:  Patient Instructions  Please start checing sugars once a day.  Please let me know if the sugars are consistently <80 or >200.  Please return in 2.5 months with your sugar log.   - Strongly advised him to start checking sugars at different times of the day - check 1x a day, rotating checks - discussed about CBG targets for treatment: 80-130 mg/dL before meals and <180 mg/dL after meals; target HbA1c <7%. - given sugar log and advised how to fill it and to bring it at next appt  - given foot care handout and explained the principles  - given instructions for hypoglycemia management "15-15 rule"  - advised for yearly eye exams  - Return to clinic in 2.5 low mo with sugar log   2. H/o Graves' disease, -  s/p RAI treatment in the 3s -Most recent TFTs have been normal per review of the chart -No further investigation needed for this, but needs a TSH checked every year  Philemon Kingdom, MD PhD [facility] Endocrinology

## 2019-09-19 NOTE — Patient Instructions (Addendum)
Please start checing sugars once a day.  Please let me know if the sugars are consistently <80 or >200.  Please return in 2.5 months with your sugar log.   PATIENT INSTRUCTIONS FOR TYPE 2 DIABETES:  DIET AND EXERCISE Diet and exercise is an important part of diabetic treatment.  We recommended aerobic exercise in the form of brisk walking (working between 40-60% of maximal aerobic capacity, similar to brisk walking) for 150 minutes per week (such as 30 minutes five days per week) along with 3 times per week performing 'resistance' training (using various gauge rubber tubes with handles) 5-10 exercises involving the major muscle groups (upper body, lower body and core) performing 10-15 repetitions (or near fatigue) each exercise. Start at half the above goal but build slowly to reach the above goals. If limited by weight, joint pain, or disability, we recommend daily walking in a swimming pool with water up to waist to reduce pressure from joints while allow for adequate exercise.    BLOOD GLUCOSES Monitoring your blood glucoses is important for continued management of your diabetes. Please check your blood glucoses 2-4 times a day: fasting, before meals and at bedtime (you can rotate these measurements - e.g. one day check before the 3 meals, the next day check before 2 of the meals and before bedtime, etc.).   HYPOGLYCEMIA (low blood sugar) Hypoglycemia is usually a reaction to not eating, exercising, or taking too much insulin/ other diabetes drugs.  Symptoms include tremors, sweating, hunger, confusion, headache, etc. Treat IMMEDIATELY with 15 grams of Carbs: . 4 glucose tablets .  cup regular juice/soda . 2 tablespoons raisins . 4 teaspoons sugar . 1 tablespoon honey Recheck blood glucose in 15 mins and repeat above if still symptomatic/blood glucose <100.  RECOMMENDATIONS TO REDUCE YOUR RISK OF DIABETIC COMPLICATIONS: * Take your prescribed MEDICATION(S) * Follow a DIABETIC diet:  Complex carbs, fiber rich foods, (monounsaturated and polyunsaturated) fats * AVOID saturated/trans fats, high fat foods, >2,300 mg salt per day. * EXERCISE at least 5 times a week for 30 minutes or preferably daily.  * DO NOT SMOKE OR DRINK more than 1 drink a day. * Check your FEET every day. Do not wear tightfitting shoes. Contact us if you develop an ulcer * See your EYE doctor once a year or more if needed * Get a FLU shot once a year * Get a PNEUMONIA vaccine once before and once after age 41 years  GOALS:  * Your Hemoglobin A1c of <7%  * fasting sugars need to be <130 * after meals sugars need to be <180 (2h after you start eating) * Your Systolic BP should be [id number] or lower  * Your Diastolic BP should be 80 or lower  * Your HDL (Good Cholesterol) should be 40 or higher  * Your LDL (Bad Cholesterol) should be 100 or lower. * Your Triglycerides should be 150 or lower  * Your Urine microalbumin (kidney function) should be <30 * Your Body Mass Index should be 25 or lower    Please consider the following ways to cut down carbs and fat and increase fiber and micronutrients in your diet: - substitute whole grain for white bread or pasta - substitute brown rice for white rice - substitute 90-calorie flat bread pieces for slices of bread when possible - substitute sweet potatoes or yams for white potatoes - substitute humus for margarine - substitute tofu for cheese when possible - substitute almond or rice milk for regular milk (  would not drink soy milk daily due to concern for soy estrogen influence on breast cancer risk) - substitute dark chocolate for other sweets when possible - substitute water - can add lemon or orange slices for taste - for diet sodas (artificial sweeteners will trick your body that you can eat sweets without getting calories and will lead you to overeating and weight gain in the long run) - do not skip breakfast or other meals (this will slow down the metabolism  and will result in more weight gain over time)  - can try smoothies made from fruit and almond/rice milk in am instead of regular breakfast - can also try old-fashioned (not instant) oatmeal made with almond/rice milk in am - order the dressing on the side when eating salad at a restaurant (pour less than half of the dressing on the salad) - eat as little meat as possible - can try juicing, but should not forget that juicing will get rid of the fiber, so would alternate with eating raw veg./fruits or drinking smoothies - use as little oil as possible, even when using olive oil - can dress a salad with a mix of balsamic vinegar and lemon juice, for e.g. - use agave nectar, stevia sugar, or regular sugar rather than artificial sweateners - steam or broil/roast veggies  - snack on veggies/fruit/nuts (unsalted, preferably) when possible, rather than processed foods - reduce or eliminate aspartame in diet (it is in diet sodas, chewing gum, etc) Read the labels!  Try to read Dr. Janene Harvey book: "Program for Reversing Diabetes" for other ideas for healthy eating.

## 2019-09-26 ENCOUNTER — Encounter (HOSPITAL_COMMUNITY): Payer: Self-pay | Admitting: Emergency Medicine

## 2019-09-26 ENCOUNTER — Emergency Department (HOSPITAL_COMMUNITY)
Admission: EM | Admit: 2019-09-26 | Discharge: 2019-09-26 | Disposition: A | Discharge status: Home / Self Care | Payer: Medicare HMO | Attending: Emergency Medicine | Admitting: Emergency Medicine

## 2019-09-26 ENCOUNTER — Emergency Department (HOSPITAL_COMMUNITY): Payer: Medicare HMO

## 2019-09-26 ENCOUNTER — Other Ambulatory Visit: Payer: Self-pay

## 2019-09-26 DIAGNOSIS — Z20828 Contact with and (suspected) exposure to other viral communicable diseases: Secondary | ICD-10-CM | POA: Insufficient documentation

## 2019-09-26 DIAGNOSIS — R0602 Shortness of breath: Secondary | ICD-10-CM | POA: Diagnosis not present

## 2019-09-26 DIAGNOSIS — R0789 Other chest pain: Secondary | ICD-10-CM | POA: Diagnosis not present

## 2019-09-26 DIAGNOSIS — I10 Essential (primary) hypertension: Secondary | ICD-10-CM | POA: Insufficient documentation

## 2019-09-26 DIAGNOSIS — R079 Chest pain, unspecified: Secondary | ICD-10-CM

## 2019-09-26 DIAGNOSIS — J029 Acute pharyngitis, unspecified: Secondary | ICD-10-CM | POA: Diagnosis not present

## 2019-09-26 DIAGNOSIS — R1310 Dysphagia, unspecified: Secondary | ICD-10-CM | POA: Diagnosis not present

## 2019-09-26 DIAGNOSIS — Z87891 Personal history of nicotine dependence: Secondary | ICD-10-CM | POA: Insufficient documentation

## 2019-09-26 LAB — BASIC METABOLIC PANEL
Anion gap: 11 (ref 5–15)
BUN: 13 mg/dL (ref 8–23)
CO2: 23 mmol/L (ref 22–32)
Calcium: 9.2 mg/dL (ref 8.9–10.3)
Chloride: 102 mmol/L (ref 98–111)
Creatinine, Ser: 1.1 mg/dL (ref 0.61–1.24)
GFR calc Af Amer: >60 mL/min (ref >60)
GFR calc non Af Amer: >60 mL/min (ref >60)
Glucose, Bld: 113 mg/dL — ABNORMAL HIGH (ref 70–99)
Potassium: 3.7 mmol/L (ref 3.5–5.1)
Sodium: 136 mmol/L (ref 135–145)

## 2019-09-26 LAB — CBC
HCT: 40.6 % (ref 39.0–52.0)
Hemoglobin: 13.7 g/dL (ref 13.0–17.0)
MCH: 30.6 pg (ref 26.0–34.0)
MCHC: 33.7 g/dL (ref 30.0–36.0)
MCV: 90.8 fL (ref 80.0–100.0)
Platelets: 138 10*3/uL — ABNORMAL LOW (ref 150–400)
RBC: 4.47 MIL/uL (ref 4.22–5.81)
RDW: 13.2 % (ref 11.5–15.5)
WBC: 7.5 10*3/uL (ref 4.0–10.5)
nRBC: 0 % (ref 0.0–0.2)

## 2019-09-26 LAB — TROPONIN I (HIGH SENSITIVITY): Troponin I (High Sensitivity): 3 ng/L (ref <18)

## 2019-09-26 LAB — GROUP A STREP BY PCR: Group A Strep by PCR: NOT DETECTED

## 2019-09-26 MED ORDER — SODIUM CHLORIDE 0.9% FLUSH: 3.0000 mL | Freq: Once | INTRAVENOUS | Status: DC

## 2019-09-26 MED ORDER — DEXAMETHASONE SODIUM PHOSPHATE 10 MG/ML IJ SOLN
10.0000 mg | Freq: Once | INTRAMUSCULAR | Status: AC
  Administered 2019-09-26: 10 mg via INTRAMUSCULAR
  Filled 2019-09-26: qty 1

## 2019-09-26 NOTE — Discharge Instructions (Addendum)
You were evaluated in the Emergency Department and after careful evaluation, we did not find any emergent condition requiring admission or further testing in the hospital.  Your exam/testing today is overall reassuring.  As discussed, you can use over-the-counter Mucinex to help with your mucus, we also recommend Tylenol or Motrin for discomfort.  Your symptoms seem to be due to a virus, possibly the coronavirus.  We have tested you for the coronavirus here in the Emergency Department.  Please isolate or quarantine at home until you receive a negative test result.  If positive, we recommend continued home quarantine per [facility] recommendations.   Please return to the Emergency Department if you experience any worsening of your condition.  We encourage you to follow up with a primary care provider.  Thank you for allowing [place] to be a part of your care.

## 2019-09-26 NOTE — ED Provider Notes (Signed)
[facility] Emergency Department Provider Note MRN:  [id number]  Arrival date & time: 09/26/19     Chief Complaint   trouble swallowing, Chest Pain, and Sore Throat   History of Present Illness   Gordon Phillips is a 73 y.o. year-old male with a history of hypertension, stroke presenting to the ED with chief complaint of sore throat.  1 to 2 days of sore throat, trouble swallowing due to the pain.  Patient also feels sensation of swelling to the throat.  Denies fever.  Endorsing left ear pain, mild cough, sharp chest discomfort with cough, denies shortness of breath, no abdominal pain, no rash, no vomiting, no diarrhea.  No sick contacts.  Review of Systems  A complete 10 system review of systems was obtained and all systems are negative except as noted in the HPI and PMH.   Patient's Health History    Past Medical History:  Diagnosis Date  . ALLERGIC RHINITIS 01/25/2010  . Arthritis    "neck, back, shoulders," (02/20/2017)  . Chronic lower back pain   . High cholesterol   . Hx of cardiovascular stress test    a. Lexiscan Myoview 02/06/13:  EF 59%, no ischemia or scar  . Hx of echocardiogram    a.  Echo 02/03/13:  Mild LVH, mild focal basal septal hypertrophy, EF 50-55%, Gr 1 DD, mild LAE  . HYPERTENSION 01/25/2010  . HYPERTHYROIDISM 01/25/2010   S/P radioactive iodine "back in the 1980s" (02/20/2017)  . Impaired glucose tolerance   . 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 Obesity   . OSA on CPAP    Uses CPAP nightly  . Pericarditis 1980's  . 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  Marland Kitchen Vitiligo     Past Surgical History:  Procedure Laterality Date  . A-FLUTTER ABLATION N/A 04/23/2019   Procedure: A-FLUTTER ABLATION;  Surgeon: Deboraha Sprang, MD;  Location: [facility];  Service: Cardiovascular;  Laterality: N/A;  . CARDIOVERSION N/A 08/14/2018   Procedure: CARDIOVERSION;  Surgeon: [name], MD;  Location: [facility] ENDOSCOPY;  Service: Cardiovascular;  Laterality: N/A;  . INGUINAL HERNIA REPAIR Bilateral   . LUMBAR LAMINECTOMY    . TEE WITHOUT CARDIOVERSION N/A 08/14/2018   Procedure: TRANSESOPHAGEAL ECHOCARDIOGRAM (TEE);  Surgeon: [name], MD;  Location: [facility] ENDOSCOPY;  Service: Cardiovascular;  Laterality: N/A;  . UMBILICAL HERNIA REPAIR      Family History  Problem Relation Age of Onset  . Stroke Father        Passed away in his 65s  . Thyroid disease Sister        goiter  . Sudden death Brother        ? Drug use, no autopsy  . Throat cancer Brother   . Colon cancer Neg Hx   . Esophageal cancer Neg Hx   . Rectal cancer Neg Hx   . Stomach cancer Neg Hx     Social History   Socioeconomic History  . Marital status: Married    Spouse name: Not on file  . Number of children: 2  . Years of education: Not on file  . Highest education level: Not on file  Occupational History  . Occupation: Geographical information systems officer  . Financial resource strain: Not on file  . Food insecurity    Worry: Not on file    Inability: Not on file  . Transportation  needs    Medical: Not on file    Non-medical: Not on file  Tobacco Use  . Smoking status: Former Smoker    Packs/day: 0.50    Years: 8.00    Pack years: 4.00    Types: Cigarettes    Quit date: 1968    Years since quitting: 52.9  . Smokeless tobacco: Never Used  Substance and Sexual Activity  . Alcohol use: Yes    Comment: 02/20/2017 "couple drinks/month on average"  . Drug use: No  . Sexual activity: Yes  Lifestyle  . Physical activity    Days per week: Not on file    Minutes per session: Not on file  . Stress: Not on file  Relationships  . Social Herbalist on phone: Not on file    Gets together: Not on file    Attends religious service: Not on file    Active member of club or organization: Not on file    Attends meetings of clubs or organizations: Not on file    Relationship status: Not on file   . Intimate partner violence    Fear of current or ex partner: Not on file    Emotionally abused: Not on file    Physically abused: Not on file    Forced sexual activity: Not on file  Other Topics Concern  . Not on file  Social History Narrative  . Not on file     Physical Exam  Vital Signs and Nursing Notes reviewed Vitals:   09/26/19 0800 09/26/19 0815  BP:  (!) 152/101  Pulse:    Resp: (!) 21 (!) 23  Temp:    SpO2:      CONSTITUTIONAL: Well-appearing, NAD NEURO:  Alert and oriented x 3, no focal deficits EYES:  eyes equal and reactive ENT/NECK:  no LAD, no JVD; mild erythema to the posterior oropharynx, no asymmetry, no significant edema CARDIO: Regular rate, well-perfused, normal S1 and S2 PULM:  CTAB no wheezing or rhonchi GI/GU:  normal bowel sounds, non-distended, non-tender MSK/SPINE:  No gross deformities, no edema SKIN:  no rash, atraumatic PSYCH:  Appropriate speech and behavior  Diagnostic and Interventional Summary    EKG Interpretation  Date/Time:  Friday September 26 2019 07:24:44 EST Ventricular Rate:  84 PR Interval:    QRS Duration: 93 QT Interval:  375 QTC Calculation: 444 R Axis:   69 Text Interpretation: Sinus rhythm Prolonged PR interval Confirmed by Gerlene Fee [phone]) on 09/26/2019 7:31:56 AM      Labs Reviewed  BASIC METABOLIC PANEL - Abnormal; Notable for the following components:      Result Value   Glucose, Bld 113 (*)    All other components within normal limits  CBC - Abnormal; Notable for the following components:   Platelets 138 (*)    All other components within normal limits  GROUP A STREP BY PCR  NOVEL CORONAVIRUS, NAA (HOSP ORDER, SEND-OUT TO REF LAB; TAT 18-24 HRS)  TROPONIN I (HIGH SENSITIVITY)    XR Chest Single View  Final Result      Medications  sodium chloride flush (NS) 0.9 % injection 3 mL (has no administration in time range)  dexamethasone (DECADRON) injection 10 mg (10 mg Intramuscular Given 09/26/19  [id number])     Procedures  /  Critical Care Procedures  ED Course and Medical Decision Making  I have reviewed the triage vital signs and the nursing notes.  Pertinent labs & imaging results that were available  during my care of the patient were reviewed by me and considered in my medical decision making (see below for details).     Symptoms are consistent with viral pharyngitis, patient has no stridor, clear lungs, no increased work of breathing, patient is tolerating his own secretions, and therefore I doubt a obstructive process or significant swelling to the trachea or esophagus.  Chest pain is atypical and seems to be occurring in the setting of viral illness, will screen with chest x-ray.  EKG without acute concerns, troponin is pending as well.  Troponin is negative, EKG is reassuring, work-up is unremarkable, strep negative.  Patient is feeling well, he is appropriate for discharge.  Presumed viral illness.  Barth Kirks. Sedonia Small, [place] [email or web address]  Final Clinical Impressions(s) / ED Diagnoses     ICD-10-CM   1. Pharyngitis, unspecified etiology  J02.9   2. Chest pain  R07.9 XR Chest Single View    XR Chest Single View    ED Discharge Orders    None       Discharge Instructions Discussed with and Provided to Patient:     Discharge Instructions     You were evaluated in the Emergency Department and after careful evaluation, we did not find any emergent condition requiring admission or further testing in the hospital.  Your exam/testing today is overall reassuring.  As discussed, you can use over-the-counter Mucinex to help with your mucus, we also recommend Tylenol or Motrin for discomfort.  Your symptoms seem to be due to a virus, possibly the coronavirus.  We have tested you for the coronavirus here in the Emergency Department.  Please isolate or quarantine at home until you receive a negative test result.  If  positive, we recommend continued home quarantine per [facility] recommendations.   Please return to the Emergency Department if you experience any worsening of your condition.  We encourage you to follow up with a primary care provider.  Thank you for allowing [place] to be a part of your care.       Maudie Flakes, MD 09/26/19 [phone]

## 2019-09-26 NOTE — ED Notes (Signed)
Pt given dc instructions pt verbalizes understanding.  

## 2019-09-26 NOTE — ED Triage Notes (Signed)
Pt here from home with co difficulty swallowing since 7pm last night. Pt states that he has had a slight sore throat x2 days. Also has a cough. Pt states that the middle of his chest was tightening up on him and he states it is probably due to stress. O2 sat 96%

## 2019-09-27 LAB — NOVEL CORONAVIRUS, NAA (HOSP ORDER, SEND-OUT TO REF LAB; TAT 18-24 HRS): SARS-CoV-2, NAA: NOT DETECTED

## 2019-09-28 ENCOUNTER — Inpatient Hospital Stay (HOSPITAL_COMMUNITY)
Admission: EM | Admit: 2019-09-28 | Discharge: 2019-10-04 | DRG: 153 | Disposition: A | Discharge status: Home / Self Care | Payer: Medicare HMO | Attending: Student | Admitting: Student

## 2019-09-28 ENCOUNTER — Other Ambulatory Visit: Payer: Self-pay

## 2019-09-28 ENCOUNTER — Encounter (HOSPITAL_COMMUNITY): Payer: Self-pay | Admitting: *Deleted

## 2019-09-28 ENCOUNTER — Emergency Department (HOSPITAL_COMMUNITY): Payer: Medicare HMO

## 2019-09-28 DIAGNOSIS — T39395A Adverse effect of other nonsteroidal anti-inflammatory drugs [NSAID], initial encounter: Secondary | ICD-10-CM | POA: Diagnosis present

## 2019-09-28 DIAGNOSIS — Z8241 Family history of sudden cardiac death: Secondary | ICD-10-CM

## 2019-09-28 DIAGNOSIS — K222 Esophageal obstruction: Secondary | ICD-10-CM | POA: Diagnosis present

## 2019-09-28 DIAGNOSIS — J383 Other diseases of vocal cords: Secondary | ICD-10-CM | POA: Diagnosis not present

## 2019-09-28 DIAGNOSIS — M47819 Spondylosis without myelopathy or radiculopathy, site unspecified: Secondary | ICD-10-CM | POA: Diagnosis present

## 2019-09-28 DIAGNOSIS — R1310 Dysphagia, unspecified: Secondary | ICD-10-CM | POA: Diagnosis not present

## 2019-09-28 DIAGNOSIS — K449 Diaphragmatic hernia without obstruction or gangrene: Secondary | ICD-10-CM | POA: Diagnosis present

## 2019-09-28 DIAGNOSIS — K219 Gastro-esophageal reflux disease without esophagitis: Secondary | ICD-10-CM | POA: Diagnosis not present

## 2019-09-28 DIAGNOSIS — K117 Disturbances of salivary secretion: Secondary | ICD-10-CM | POA: Diagnosis not present

## 2019-09-28 DIAGNOSIS — K2970 Gastritis, unspecified, without bleeding: Secondary | ICD-10-CM | POA: Diagnosis present

## 2019-09-28 DIAGNOSIS — I4892 Unspecified atrial flutter: Secondary | ICD-10-CM | POA: Diagnosis not present

## 2019-09-28 DIAGNOSIS — G500 Trigeminal neuralgia: Secondary | ICD-10-CM | POA: Diagnosis present

## 2019-09-28 DIAGNOSIS — J0510 Acute epiglottitis without obstruction: Secondary | ICD-10-CM | POA: Diagnosis present

## 2019-09-28 DIAGNOSIS — Z8639 Personal history of other endocrine, nutritional and metabolic disease: Secondary | ICD-10-CM

## 2019-09-28 DIAGNOSIS — K2980 Duodenitis without bleeding: Secondary | ICD-10-CM | POA: Diagnosis present

## 2019-09-28 DIAGNOSIS — Z20828 Contact with and (suspected) exposure to other viral communicable diseases: Secondary | ICD-10-CM | POA: Diagnosis not present

## 2019-09-28 DIAGNOSIS — R000 Tachycardia, unspecified: Secondary | ICD-10-CM | POA: Diagnosis not present

## 2019-09-28 DIAGNOSIS — J9811 Atelectasis: Secondary | ICD-10-CM | POA: Diagnosis not present

## 2019-09-28 DIAGNOSIS — K3189 Other diseases of stomach and duodenum: Secondary | ICD-10-CM | POA: Diagnosis not present

## 2019-09-28 DIAGNOSIS — Z823 Family history of stroke: Secondary | ICD-10-CM

## 2019-09-28 DIAGNOSIS — G4733 Obstructive sleep apnea (adult) (pediatric): Secondary | ICD-10-CM | POA: Diagnosis present

## 2019-09-28 DIAGNOSIS — I480 Paroxysmal atrial fibrillation: Secondary | ICD-10-CM | POA: Diagnosis present

## 2019-09-28 DIAGNOSIS — K259 Gastric ulcer, unspecified as acute or chronic, without hemorrhage or perforation: Secondary | ICD-10-CM | POA: Diagnosis not present

## 2019-09-28 DIAGNOSIS — E890 Postprocedural hypothyroidism: Secondary | ICD-10-CM | POA: Diagnosis present

## 2019-09-28 DIAGNOSIS — M1909 Primary osteoarthritis, other specified site: Secondary | ICD-10-CM | POA: Diagnosis present

## 2019-09-28 DIAGNOSIS — Z8349 Family history of other endocrine, nutritional and metabolic diseases: Secondary | ICD-10-CM

## 2019-09-28 DIAGNOSIS — J309 Allergic rhinitis, unspecified: Secondary | ICD-10-CM | POA: Diagnosis present

## 2019-09-28 DIAGNOSIS — I482 Chronic atrial fibrillation, unspecified: Secondary | ICD-10-CM | POA: Diagnosis present

## 2019-09-28 DIAGNOSIS — N400 Enlarged prostate without lower urinary tract symptoms: Secondary | ICD-10-CM | POA: Diagnosis present

## 2019-09-28 DIAGNOSIS — Z6835 Body mass index (BMI) 35.0-35.9, adult: Secondary | ICD-10-CM | POA: Diagnosis not present

## 2019-09-28 DIAGNOSIS — Z808 Family history of malignant neoplasm of other organs or systems: Secondary | ICD-10-CM

## 2019-09-28 DIAGNOSIS — K111 Hypertrophy of salivary gland: Secondary | ICD-10-CM | POA: Diagnosis present

## 2019-09-28 DIAGNOSIS — M19012 Primary osteoarthritis, left shoulder: Secondary | ICD-10-CM | POA: Diagnosis present

## 2019-09-28 DIAGNOSIS — R1314 Dysphagia, pharyngoesophageal phase: Secondary | ICD-10-CM | POA: Diagnosis not present

## 2019-09-28 DIAGNOSIS — D472 Monoclonal gammopathy: Secondary | ICD-10-CM | POA: Diagnosis present

## 2019-09-28 DIAGNOSIS — Z7982 Long term (current) use of aspirin: Secondary | ICD-10-CM

## 2019-09-28 DIAGNOSIS — Z8679 Personal history of other diseases of the circulatory system: Secondary | ICD-10-CM

## 2019-09-28 DIAGNOSIS — Z79899 Other long term (current) drug therapy: Secondary | ICD-10-CM

## 2019-09-28 DIAGNOSIS — I440 Atrioventricular block, first degree: Secondary | ICD-10-CM | POA: Diagnosis present

## 2019-09-28 DIAGNOSIS — Z8673 Personal history of transient ischemic attack (TIA), and cerebral infarction without residual deficits: Secondary | ICD-10-CM

## 2019-09-28 DIAGNOSIS — J387 Other diseases of larynx: Secondary | ICD-10-CM | POA: Diagnosis not present

## 2019-09-28 DIAGNOSIS — E785 Hyperlipidemia, unspecified: Secondary | ICD-10-CM | POA: Diagnosis not present

## 2019-09-28 DIAGNOSIS — R7303 Prediabetes: Secondary | ICD-10-CM | POA: Diagnosis present

## 2019-09-28 DIAGNOSIS — E662 Morbid (severe) obesity with alveolar hypoventilation: Secondary | ICD-10-CM | POA: Diagnosis not present

## 2019-09-28 DIAGNOSIS — I4891 Unspecified atrial fibrillation: Secondary | ICD-10-CM | POA: Diagnosis present

## 2019-09-28 DIAGNOSIS — Z86010 Personal history of colonic polyps: Secondary | ICD-10-CM

## 2019-09-28 DIAGNOSIS — I10 Essential (primary) hypertension: Secondary | ICD-10-CM | POA: Diagnosis present

## 2019-09-28 DIAGNOSIS — G8929 Other chronic pain: Secondary | ICD-10-CM | POA: Diagnosis present

## 2019-09-28 DIAGNOSIS — Z888 Allergy status to other drugs, medicaments and biological substances status: Secondary | ICD-10-CM

## 2019-09-28 DIAGNOSIS — R519 Headache, unspecified: Secondary | ICD-10-CM | POA: Diagnosis not present

## 2019-09-28 DIAGNOSIS — R0602 Shortness of breath: Secondary | ICD-10-CM | POA: Diagnosis not present

## 2019-09-28 DIAGNOSIS — Z923 Personal history of irradiation: Secondary | ICD-10-CM

## 2019-09-28 DIAGNOSIS — Z87891 Personal history of nicotine dependence: Secondary | ICD-10-CM

## 2019-09-28 DIAGNOSIS — E6609 Other obesity due to excess calories: Secondary | ICD-10-CM | POA: Diagnosis not present

## 2019-09-28 DIAGNOSIS — K2990 Gastroduodenitis, unspecified, without bleeding: Secondary | ICD-10-CM | POA: Diagnosis not present

## 2019-09-28 DIAGNOSIS — M19011 Primary osteoarthritis, right shoulder: Secondary | ICD-10-CM | POA: Diagnosis present

## 2019-09-28 LAB — CBG MONITORING, ED: Glucose-Capillary: 82 mg/dL (ref 70–99)

## 2019-09-28 MED ORDER — SUCRALFATE 1 GM/10ML PO SUSP
1.0000 g | Freq: Once | ORAL | Status: AC
  Administered 2019-09-28: 1 g via ORAL
  Filled 2019-09-28: qty 10

## 2019-09-28 MED ORDER — PANTOPRAZOLE SODIUM 40 MG IV SOLR
40.0000 mg | Freq: Once | INTRAVENOUS | Status: AC
  Administered 2019-09-28: 40 mg via INTRAVENOUS
  Filled 2019-09-28: qty 40

## 2019-09-28 MED ORDER — SODIUM CHLORIDE 0.9 % IV SOLN
Freq: Once | INTRAVENOUS | Status: AC
  Administered 2019-09-28: 20:00:00 via INTRAVENOUS

## 2019-09-28 MED ORDER — IOHEXOL 300 MG/ML  SOLN
100.0000 mL | Freq: Once | INTRAMUSCULAR | Status: AC | PRN
  Administered 2019-09-28: 100 mL via INTRAVENOUS

## 2019-09-28 NOTE — ED Triage Notes (Signed)
Pt was just seen Thursday  Here for the same and was worked up

## 2019-09-28 NOTE — ED Notes (Signed)
Pt attempting to drink carafate; coughing, then vomiting medication back up

## 2019-09-28 NOTE — ED Triage Notes (Signed)
Unable to eat drink or swallow meds since Thursday everything hurts  He cannot swallow his saliva

## 2019-09-28 NOTE — ED Provider Notes (Signed)
[name] EMERGENCY DEPARTMENT Provider Note   CSN: [id number] Arrival date & time: 09/28/19  1600     History   Chief Complaint Chief Complaint  Patient presents with   unable to swallow    HPI Gordon Phillips is a 73 y.o. male.     HPI  Gordon Phillips is a 73 year old male with past medical history of high cholesterol, hypertension, OSA on CPAP, prior CVA without deficits, A fib/ flutter (s/p ablation) who presents to the ED with concern for sore throat. Patient reports beginning early last week he began to have a pins-and-needles pain like sensation in his throat.  He reports by Thursday evening he began having pain in his mid to lower throat.  Patient states it began to hurt to swallow food or medications.  He states he was seen in the emergency department and given steroids but did not seem to help.  Patient states since yesterday he has not been able to swallow his home medications and feels like he spits everything back up.  Patient states he did eat steak last week but does not recall any sort of events leading to choking or sensation of the food getting stuck.  Similarly, patient denies any known sensation of getting any pill stuck in his throat.  Patient has any fever.  He still has his tonsils.  He reports he got all of his childhood vaccinations.  Patient denies any other illnesses.  Denies any chest pain, difficulty breathing.  Of note, patient states he does feel like his voice is hoarse.  Past Medical History:  Diagnosis Date   ALLERGIC RHINITIS 01/25/2010   Arthritis    "neck, back, shoulders," (02/20/2017)   Chronic lower back pain    High cholesterol    Hx of cardiovascular stress test    a. [place] 02/06/13:  EF 59%, no ischemia or scar   Hx of echocardiogram    a.  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   Vitiligo     Patient Active Problem List   Diagnosis Date Noted   Dysphagia 09/29/2019   Atrial fibrillation with RVR ([place]) 09/29/2019   Prediabetes 09/19/2019   H/O Graves' disease 09/19/2019   Deviated septum 08/20/2018   BPH (benign prostatic hyperplasia) 08/20/2018   Anal irritation 08/20/2018   Atrial flutter ([place]) 08/12/2018   HLD (hyperlipidemia) 02/20/2017   History of CVA (cerebrovascular accident) 02/20/2017   Back pain, chronic 11/22/2015   MGUS (monoclonal gammopathy of unknown significance) 01/21/2015   OSA (obstructive sleep apnea) 03/16/2014   Essential hypertension 01/25/2010   Allergic rhinitis 01/25/2010    Past Surgical History:  Procedure Laterality Date   A-FLUTTER ABLATION N/A 04/23/2019   Procedure: A-FLUTTER ABLATION;  Surgeon: Deboraha Sprang, MD;  Location: [facility] [facility];  Service: Cardiovascular;  Laterality: N/A;   CARDIOVERSION N/A 08/14/2018   Procedure: CARDIOVERSION;  Surgeon: [name], MD;  Location: [facility] ENDOSCOPY;  Service: Cardiovascular;  Laterality: N/A;   INGUINAL HERNIA REPAIR Bilateral  LUMBAR LAMINECTOMY     TEE WITHOUT CARDIOVERSION N/A 08/14/2018   Procedure: TRANSESOPHAGEAL ECHOCARDIOGRAM (TEE);  Surgeon: [name], MD;  Location: [facility] ENDOSCOPY;  Service: Cardiovascular;  Laterality: N/A;   UMBILICAL HERNIA REPAIR          Home Medications    Prior to Admission medications   Medication Sig Start Date End Date Taking? Authorizing Provider  acetaminophen-codeine (TYLENOL #3) 300-30 MG tablet Take 1 tablet by mouth every 6 (six) hours as needed for moderate pain. 08/26/19  Yes Vivi Barrack, MD  Aloe-Sodium  Chloride (AYR SALINE NASAL GEL NA) Place 1 application into the nose at bedtime.   Yes [provider]  Aromatic Inhalants (VICKS VAPOINHALER) INHA Place 1 puff into both nostrils as needed (for congestion or stuffiness).   Yes [provider]  Aspirin-Caffeine (BAYER BACK & BODY) 500-32.5 MG TABS Take 2 tablets by mouth every morning.   Yes [provider]  AYR SALINE NASAL DROPS NA Place 2 sprays into both nostrils at bedtime.    Yes [provider]  diltiazem (CARDIZEM CD) 120 MG 24 hr capsule Take 1 capsule (120 mg total) by mouth daily. Patient taking differently: Take 120 mg by mouth at bedtime.  01/07/19  Yes Vivi Barrack, MD  doxazosin (CARDURA) 2 MG tablet Take 1 tablet (2 mg total) by mouth at bedtime. 01/07/19  Yes Vivi Barrack, MD  hydrocortisone cream 1 % Apply 1 application topically 2 (two) times daily as needed for itching.   Yes [provider]  metoprolol succinate (TOPROL-XL) 100 MG 24 hr tablet Take 1 tablet (100 mg total) by mouth at bedtime. Take with or immediately following a meal. 01/07/19  Yes Vivi Barrack, MD  olmesartan (BENICAR) 40 MG tablet TAKE 1 TABLET BY MOUTH EVERY DAY Patient taking differently: Take 40 mg by mouth at bedtime.  04/11/19  Yes Vivi Barrack, MD  OVER THE COUNTER MEDICATION Take 1 tablet by mouth 2 (two) times daily. OTC - Super Beta Prostate   Yes [provider]  pravastatin (PRAVACHOL) 40 MG tablet Take 1 tablet (40 mg total) by mouth at bedtime. 01/07/19  Yes Vivi Barrack, MD  PRESCRIPTION MEDICATION Inhale into the lungs at bedtime. CPAP   Yes [provider]  sildenafil (VIAGRA) 100 MG tablet Take 100 mg by mouth daily as needed for erectile dysfunction.    Yes [provider]  vitamin C (ASCORBIC ACID) 500 MG tablet Take 1,000 mg by mouth every morning.   Yes [provider]  cyclobenzaprine (FLEXERIL) 5 MG tablet Take 1 tablet (5 mg total) by mouth 3 (three)  times daily as needed. Patient not taking: Reported on 09/28/2019 08/13/19   Drenda Freeze, MD  glucose blood test strip Use to check blood sugar once a day 09/19/19   Philemon Kingdom, MD    Family History Family History  Problem Relation Age of Onset   Stroke Father        Passed away in his 73s   Thyroid disease Sister        goiter   Sudden death Brother        ? Drug use, no autopsy   Throat cancer Brother    Colon cancer Neg Hx    Esophageal cancer Neg Hx    Rectal cancer Neg Hx    Stomach cancer Neg Hx     Social History Social History   Tobacco Use   Smoking  status: Former Smoker    Packs/day: 0.50    Years: 8.00    Pack years: 4.00    Types: Cigarettes    Quit date: 1968    Years since quitting: 52.9   Smokeless tobacco: Never Used  Substance Use Topics   Alcohol use: Yes    Comment: 02/20/2017 "couple drinks/month on average"   Drug use: No     Allergies   Flomax [tamsulosin hcl], Relpax [eletriptan], and Other   Review of Systems Review of Systems  Constitutional: Negative for chills and fever.  HENT: Positive for sore throat, trouble swallowing and voice change. Negative for ear pain, facial swelling and sinus pain.   Eyes: Negative for pain and visual disturbance.  Respiratory: Negative for cough and shortness of breath.   Cardiovascular: Negative for chest pain and palpitations.  Gastrointestinal: Negative for abdominal pain and vomiting.  Genitourinary: Negative for dysuria and hematuria.  Musculoskeletal: Negative for arthralgias and back pain.  Skin: Negative for color change and rash.  Neurological: Negative for seizures, syncope, weakness, numbness and headaches.  All other systems reviewed and are negative.    Physical Exam Updated Vital Signs BP 116/72    Pulse 73    Temp 98.6 F (37 C) (Oral)    Resp (!) 24    Ht 6\' 2"  (1.88 m)    Wt 127 kg    SpO2 91%    BMI 35.95 kg/m   Physical Exam Vitals signs and nursing  note reviewed.  Constitutional:      Appearance: He is well-developed.  HENT:     Head: Normocephalic and atraumatic.     Mouth/Throat:     Comments: No trismus Oropharynx unremarkable, uvula midline, no tonsillar swelling or erythema  Eyes:     Extraocular Movements: Extraocular movements intact.     Conjunctiva/sclera: Conjunctivae normal.     Pupils: Pupils are equal, round, and reactive to light.  Neck:     Musculoskeletal: Normal range of motion and neck supple. No neck rigidity or muscular tenderness.     Comments: No external evidence of parotid swelling/erythema or TTP Cardiovascular:     Rate and Rhythm: Normal rate and regular rhythm.     Heart sounds: No murmur.  Pulmonary:     Effort: Pulmonary effort is normal. No respiratory distress.     Breath sounds: Normal breath sounds.  Abdominal:     Palpations: Abdomen is soft.     Tenderness: There is no abdominal tenderness.  Lymphadenopathy:     Cervical: Cervical adenopathy (Non tender, no overlying erythema) present.  Skin:    General: Skin is warm and dry.  Neurological:     General: No focal deficit present.     Mental Status: He is alert and oriented to person, place, and time. Mental status is at baseline.     Cranial Nerves: No cranial nerve deficit.     Sensory: No sensory deficit.     Motor: No weakness.     Coordination: Coordination normal.  Psychiatric:        Mood and Affect: Mood normal.        Behavior: Behavior normal.      ED Treatments / Results  Labs (all labs ordered are listed, but only abnormal results are displayed) Labs Reviewed  SARS CORONAVIRUS 2 BY RT PCR ([facility])  SARS CORONAVIRUS 2 (TAT 6-24 HRS)  CBC WITH DIFFERENTIAL/PLATELET  BASIC METABOLIC PANEL  BASIC METABOLIC PANEL  CBC  TSH  CBG MONITORING, ED  TROPONIN I (HIGH SENSITIVITY)    EKG EKG Interpretation  Date/Time:  Sunday September 28 2019 16:12:14 EST Ventricular  Rate:  102 PR Interval:  214 QRS Duration: 90 QT Interval:  340 QTC Calculation: 443 R Axis:   74 Text Interpretation: Sinus tachycardia with 1st degree A-V block with Premature atrial complexes Otherwise normal ECG Confirmed by Elnora Morrison [phone]) on 09/28/2019 7:27:09 PM   Radiology Ct Soft Tissue Neck W Contrast  Result Date: 09/28/2019 CLINICAL DATA:  Dysphagia EXAM: CT NECK WITH CONTRAST TECHNIQUE: Multidetector CT imaging of the neck was performed using the standard protocol following the bolus administration of intravenous contrast. CONTRAST:  151mL OMNIPAQUE IOHEXOL 300 MG/ML  SOLN COMPARISON:  None. FINDINGS: PHARYNX AND LARYNX: --Nasopharynx: Fossae of Rosenmuller are clear. Normal adenoid tonsils for age. --Oral cavity and oropharynx: The palatine and lingual tonsils are normal. The visible oral cavity and floor of mouth are normal. --Hypopharynx: Normal vallecula and pyriform sinuses. --Larynx: Normal epiglottis and pre-epiglottic space. Normal aryepiglottic and vocal folds. --Retropharyngeal space: No abscess, effusion or lymphadenopathy. SALIVARY GLANDS: --Parotid: Both parotid glands are enlarged without focal abnormality. --Submandibular: Both are enlarged without focal abnormality. --Sublingual: Normal. No ranula or other visible lesion of the base of tongue and floor of mouth. THYROID: Normal. LYMPH NODES: No enlarged or abnormal density lymph nodes. VASCULAR: Major cervical vessels are patent. LIMITED INTRACRANIAL: Normal. VISUALIZED ORBITS: Normal. MASTOIDS AND VISUALIZED PARANASAL SINUSES: No fluid levels or advanced mucosal thickening. No mastoid effusion. SKELETON: No bony spinal canal stenosis. No lytic or blastic lesions. UPPER CHEST: Clear. OTHER: None. IMPRESSION: 1. No acute abnormality of the cervical soft tissues. 2. 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 Electronically Signed   By: Ulyses Jarred M.D.   On: 09/28/2019 21:35   Dg Chest Portable 1 View  Result Date: 09/29/2019 CLINICAL DATA:  Atrial fibrillation and difficulty swallowing for several hours EXAM: PORTABLE CHEST 1 VIEW COMPARISON:  09/26/2019 FINDINGS: Cardiac shadows within normal limits. Skin fold is noted over the right chest. The lungs are well aerated bilaterally. Very mild left basilar atelectasis is seen. No bony abnormality is noted. IMPRESSION: Mild left basilar atelectasis. Electronically Signed   By: Inez Catalina M.D.   On: 09/29/2019 00:35    Procedures Procedures (including critical care time)  Medications Ordered in ED Medications  acetaminophen (TYLENOL) tablet 650 mg (has no administration in time range)    Or  acetaminophen (TYLENOL) suppository 650 mg (has no administration in time range)  ondansetron (ZOFRAN) tablet 4 mg (has no administration in time range)    Or  ondansetron (ZOFRAN) injection 4 mg (has no administration in time range)  dextrose 5 %-0.9 % sodium chloride infusion (has no administration in time range)  0.9 %  sodium chloride infusion ( Intravenous Stopped 09/28/19 2339)  iohexol (OMNIPAQUE) 300 MG/ML solution 100 mL (100 mLs Intravenous Contrast Given 09/28/19 2102)  sucralfate (CARAFATE) 1 GM/10ML suspension 1 g (1 g Oral Given 09/28/19 2204)  pantoprazole (PROTONIX) injection 40 mg (40 mg Intravenous Given 09/28/19 2333)  diltiazem (CARDIZEM) injection 20 mg (20 mg Intravenous Given 09/29/19 0035)     Initial Impression / Assessment and Plan / ED Course  I have reviewed the triage vital signs and the nursing notes.  Pertinent labs & imaging results that were available during my care of the patient were reviewed by me and considered in my medical decision  making (see chart for details).        On arrival, patient is afebrile, HDS, well-appearing.  Patient is speaking in full sentences.  Patient does have an emesis bag at bedside and intermittently has to spit up his  secretions.  No trismus.   Oropharynx is unremarkable.  Uvula midline.  No evidence of tonsillar erythema or swelling.  Low suspicion for tonsillitis/pharyngitis. FROM of neck.  No clinical evidence of PTA.  Low suspicion for RPA.  Considered: Esophagitis, perhaps medication induced or secondary to use of CPAP/candidal, food bolus, esophageal stricture, malignancy No gross neuro deficits to suggest CVA/TIA. No dysarthria, diplopia or dizziness. Additionally, would not expect neuro coordination etiology of dysphagia to initially present with pain/odynophagia.   CT neck: No acute findings to suggest abscess/foreign body/food bolus. Radiology comments on diffusely enlarged parotid and submandibular glands. Pt persistently endorses pain in his mid throat/distal to parotid glands. Parotids are non tender, no evidence of edema, no overlying erythema, doubt relation to current dysphagia.   Pt given PPI and carafate. Unable to fully tolerate carafate but endorses some improvement. Unable to tolerate PO. GI consulted and recommend admission to medicine for AM EGD pending covid 19 test.  Upon final assessment, pt found to be in afib with RVR. He is HDS. History of atrial fib/flutter with ablation over the summer no longer on eliquis. He remains asymptomatic from a cardiac standpoint (no chest pain/shortness of breath/palpitations). Likely 2/2 to missed home metoprolol and diltiazem. IV diltiazem bolus given with improvement of rate.   Hospitalist contacted for admission. Dr. Hal Hope has assumed care. Pt updated on plan for admission and agreeable.    Final Clinical Impressions(s) / ED Diagnoses   Final diagnoses:  Dysphagia, unspecified type  Odynophagia  Atrial fibrillation, unspecified type [facility])    ED Discharge Orders    None       Burns Spain, MD 09/29/19 1240    Elnora Morrison, MD 10/01/19 1018    Elnora Morrison, MD 10/01/19 1019

## 2019-09-28 NOTE — ED Notes (Signed)
CT contacted about normal creatinine on 11/20; no new blood need at this time

## 2019-09-28 NOTE — ED Notes (Signed)
Pulse ox reading heart rate ranging from 30-150. 5-lead ekg showing irregular fast rate; ekg showing afib rvr. Pt reports hx of a-flutter with a cardiac ablation a few years ago.  Also feels that secretions are less since carafate and protonix

## 2019-09-29 ENCOUNTER — Other Ambulatory Visit: Payer: Self-pay

## 2019-09-29 ENCOUNTER — Emergency Department (HOSPITAL_COMMUNITY): Payer: Medicare HMO

## 2019-09-29 ENCOUNTER — Observation Stay (HOSPITAL_COMMUNITY): Payer: Medicare HMO | Admitting: Certified Registered"

## 2019-09-29 ENCOUNTER — Encounter (HOSPITAL_COMMUNITY): Payer: Self-pay | Admitting: Internal Medicine

## 2019-09-29 ENCOUNTER — Encounter (HOSPITAL_COMMUNITY)
Admission: EM | Disposition: A | Discharge status: Home / Self Care | Payer: Self-pay | Source: Home / Self Care | Attending: Student

## 2019-09-29 DIAGNOSIS — R1314 Dysphagia, pharyngoesophageal phase: Secondary | ICD-10-CM

## 2019-09-29 DIAGNOSIS — E785 Hyperlipidemia, unspecified: Secondary | ICD-10-CM | POA: Diagnosis not present

## 2019-09-29 DIAGNOSIS — I4891 Unspecified atrial fibrillation: Secondary | ICD-10-CM | POA: Diagnosis present

## 2019-09-29 DIAGNOSIS — Z20828 Contact with and (suspected) exposure to other viral communicable diseases: Secondary | ICD-10-CM | POA: Diagnosis not present

## 2019-09-29 DIAGNOSIS — I4892 Unspecified atrial flutter: Secondary | ICD-10-CM | POA: Diagnosis not present

## 2019-09-29 DIAGNOSIS — K3189 Other diseases of stomach and duodenum: Secondary | ICD-10-CM | POA: Diagnosis not present

## 2019-09-29 DIAGNOSIS — J9811 Atelectasis: Secondary | ICD-10-CM | POA: Diagnosis not present

## 2019-09-29 DIAGNOSIS — R1310 Dysphagia, unspecified: Secondary | ICD-10-CM

## 2019-09-29 DIAGNOSIS — K111 Hypertrophy of salivary gland: Secondary | ICD-10-CM | POA: Diagnosis not present

## 2019-09-29 DIAGNOSIS — K117 Disturbances of salivary secretion: Secondary | ICD-10-CM

## 2019-09-29 DIAGNOSIS — K259 Gastric ulcer, unspecified as acute or chronic, without hemorrhage or perforation: Secondary | ICD-10-CM | POA: Diagnosis not present

## 2019-09-29 DIAGNOSIS — J387 Other diseases of larynx: Secondary | ICD-10-CM | POA: Diagnosis not present

## 2019-09-29 DIAGNOSIS — I482 Chronic atrial fibrillation, unspecified: Secondary | ICD-10-CM | POA: Diagnosis not present

## 2019-09-29 DIAGNOSIS — K222 Esophageal obstruction: Secondary | ICD-10-CM | POA: Diagnosis not present

## 2019-09-29 DIAGNOSIS — E662 Morbid (severe) obesity with alveolar hypoventilation: Secondary | ICD-10-CM | POA: Diagnosis not present

## 2019-09-29 DIAGNOSIS — I10 Essential (primary) hypertension: Secondary | ICD-10-CM | POA: Diagnosis not present

## 2019-09-29 DIAGNOSIS — J383 Other diseases of vocal cords: Secondary | ICD-10-CM | POA: Diagnosis not present

## 2019-09-29 DIAGNOSIS — K2990 Gastroduodenitis, unspecified, without bleeding: Secondary | ICD-10-CM | POA: Diagnosis not present

## 2019-09-29 DIAGNOSIS — J0510 Acute epiglottitis without obstruction: Secondary | ICD-10-CM | POA: Diagnosis not present

## 2019-09-29 HISTORY — PX: ESOPHAGOGASTRODUODENOSCOPY: SHX5428

## 2019-09-29 HISTORY — PX: BIOPSY: SHX5522

## 2019-09-29 LAB — CBC WITH DIFFERENTIAL/PLATELET
Abs Immature Granulocytes: 0.03 10*3/uL (ref 0.00–0.07)
Basophils Absolute: 0 10*3/uL (ref 0.0–0.1)
Basophils Relative: 0 %
Eosinophils Absolute: 0.2 10*3/uL (ref 0.0–0.5)
Eosinophils Relative: 2 %
HCT: 41.8 % (ref 39.0–52.0)
Hemoglobin: 14 g/dL (ref 13.0–17.0)
Immature Granulocytes: 0 %
Lymphocytes Relative: 34 %
Lymphs Abs: 3.3 10*3/uL (ref 0.7–4.0)
MCH: 31 pg (ref 26.0–34.0)
MCHC: 33.5 g/dL (ref 30.0–36.0)
MCV: 92.5 fL (ref 80.0–100.0)
Monocytes Absolute: 0.9 10*3/uL (ref 0.1–1.0)
Monocytes Relative: 9 %
Neutro Abs: 5.4 10*3/uL (ref 1.7–7.7)
Neutrophils Relative %: 55 %
Platelets: 151 10*3/uL (ref 150–400)
RBC: 4.52 MIL/uL (ref 4.22–5.81)
RDW: 13.4 % (ref 11.5–15.5)
WBC: 9.8 10*3/uL (ref 4.0–10.5)
nRBC: 0 % (ref 0.0–0.2)

## 2019-09-29 LAB — BASIC METABOLIC PANEL
Anion gap: 11 (ref 5–15)
Anion gap: 9 (ref 5–15)
BUN: 22 mg/dL (ref 8–23)
BUN: 23 mg/dL (ref 8–23)
CO2: 20 mmol/L — ABNORMAL LOW (ref 22–32)
CO2: 22 mmol/L (ref 22–32)
Calcium: 8.3 mg/dL — ABNORMAL LOW (ref 8.9–10.3)
Calcium: 8.9 mg/dL (ref 8.9–10.3)
Chloride: 107 mmol/L (ref 98–111)
Chloride: 112 mmol/L — ABNORMAL HIGH (ref 98–111)
Creatinine, Ser: 1.15 mg/dL (ref 0.61–1.24)
Creatinine, Ser: 1.19 mg/dL (ref 0.61–1.24)
GFR calc Af Amer: >60 mL/min (ref >60)
GFR calc Af Amer: >60 mL/min (ref >60)
GFR calc non Af Amer: >60 mL/min (ref >60)
GFR calc non Af Amer: >60 mL/min (ref >60)
Glucose, Bld: 82 mg/dL (ref 70–99)
Glucose, Bld: 87 mg/dL (ref 70–99)
Potassium: 3.5 mmol/L (ref 3.5–5.1)
Potassium: 3.6 mmol/L (ref 3.5–5.1)
Sodium: 140 mmol/L (ref 135–145)
Sodium: 141 mmol/L (ref 135–145)

## 2019-09-29 LAB — CBG MONITORING, ED
Glucose-Capillary: 72 mg/dL (ref 70–99)
Glucose-Capillary: 90 mg/dL (ref 70–99)
Glucose-Capillary: 70 mg/dL (ref 70–99)

## 2019-09-29 LAB — SARS CORONAVIRUS 2 BY RT PCR (HOSPITAL ORDER, PERFORMED IN ~~LOC~~ HOSPITAL LAB): SARS Coronavirus 2: NEGATIVE

## 2019-09-29 LAB — CBC
HCT: 40.5 % (ref 39.0–52.0)
Hemoglobin: 13.3 g/dL (ref 13.0–17.0)
MCH: 30.9 pg (ref 26.0–34.0)
MCHC: 32.8 g/dL (ref 30.0–36.0)
MCV: 94.2 fL (ref 80.0–100.0)
Platelets: 133 10*3/uL — ABNORMAL LOW (ref 150–400)
RBC: 4.3 MIL/uL (ref 4.22–5.81)
RDW: 13.6 % (ref 11.5–15.5)
WBC: 8.7 10*3/uL (ref 4.0–10.5)
nRBC: 0 % (ref 0.0–0.2)

## 2019-09-29 LAB — SARS CORONAVIRUS 2 (TAT 6-24 HRS): SARS Coronavirus 2: NEGATIVE

## 2019-09-29 LAB — TSH: TSH: 0.773 u[IU]/mL (ref 0.350–4.500)

## 2019-09-29 LAB — GLUCOSE, CAPILLARY: Glucose-Capillary: 86 mg/dL (ref 70–99)

## 2019-09-29 LAB — TROPONIN I (HIGH SENSITIVITY)
Troponin I (High Sensitivity): 9 ng/L (ref <18)
Troponin I (High Sensitivity): 10 ng/L (ref <18)

## 2019-09-29 SURGERY — EGD (ESOPHAGOGASTRODUODENOSCOPY)
Anesthesia: Monitor Anesthesia Care

## 2019-09-29 MED ORDER — MORPHINE SULFATE (PF) 2 MG/ML IV SOLN
2.0000 mg | Freq: Once | INTRAVENOUS | Status: AC
  Administered 2019-09-29: 2 mg via INTRAVENOUS
  Filled 2019-09-29: qty 1

## 2019-09-29 MED ORDER — SODIUM CHLORIDE 0.9 % IV SOLN
INTRAVENOUS | Status: DC
  Administered 2019-09-29: 01:00:00 via INTRAVENOUS

## 2019-09-29 MED ORDER — ACETAMINOPHEN 650 MG RE SUPP: 650.0000 mg | Freq: Four times a day (QID) | RECTAL | Status: DC | PRN

## 2019-09-29 MED ORDER — PANTOPRAZOLE SODIUM 40 MG IV SOLR: 40.0000 mg | Freq: Once | INTRAVENOUS | Status: DC

## 2019-09-29 MED ORDER — ACETAMINOPHEN 325 MG PO TABS
650.0000 mg | ORAL_TABLET | Freq: Four times a day (QID) | ORAL | Status: DC | PRN
  Filled 2019-09-29: qty 2

## 2019-09-29 MED ORDER — ONDANSETRON HCL 4 MG/2ML IJ SOLN: 4.0000 mg | Freq: Four times a day (QID) | INTRAMUSCULAR | Status: DC | PRN

## 2019-09-29 MED ORDER — METOPROLOL TARTRATE 5 MG/5ML IV SOLN
5.0000 mg | Freq: Four times a day (QID) | INTRAVENOUS | Status: DC
  Administered 2019-09-29 – 2019-09-30 (×5): 5 mg via INTRAVENOUS
  Filled 2019-09-29 (×5): qty 5

## 2019-09-29 MED ORDER — LACTATED RINGERS IV SOLN
INTRAVENOUS | Status: AC | PRN
  Administered 2019-09-29: 1000 mL via INTRAVENOUS

## 2019-09-29 MED ORDER — PROPOFOL 500 MG/50ML IV EMUL
INTRAVENOUS | Status: DC | PRN
  Administered 2019-09-29: 150 ug/kg/min via INTRAVENOUS

## 2019-09-29 MED ORDER — PROPOFOL 10 MG/ML IV BOLUS
INTRAVENOUS | Status: DC | PRN
  Administered 2019-09-29: 20 mg via INTRAVENOUS

## 2019-09-29 MED ORDER — DILTIAZEM HCL 25 MG/5ML IV SOLN
20.0000 mg | Freq: Once | INTRAVENOUS | Status: AC
  Administered 2019-09-29: 20 mg via INTRAVENOUS
  Filled 2019-09-29 (×3): qty 5

## 2019-09-29 MED ORDER — LACTATED RINGERS IV SOLN
INTRAVENOUS | Status: DC | PRN
  Administered 2019-09-29: 09:00:00 via INTRAVENOUS

## 2019-09-29 MED ORDER — SODIUM CHLORIDE 0.9 % IV SOLN: INTRAVENOUS | Status: DC

## 2019-09-29 MED ORDER — DEXTROSE-NACL 5-0.9 % IV SOLN
INTRAVENOUS | Status: AC
  Administered 2019-09-29 (×2): via INTRAVENOUS

## 2019-09-29 MED ORDER — LIDOCAINE HCL (CARDIAC) PF 100 MG/5ML IV SOSY
PREFILLED_SYRINGE | INTRAVENOUS | Status: DC | PRN
  Administered 2019-09-29: 40 mg via INTRAVENOUS

## 2019-09-29 MED ORDER — ONDANSETRON HCL 4 MG PO TABS: 4.0000 mg | ORAL_TABLET | Freq: Four times a day (QID) | ORAL | Status: DC | PRN

## 2019-09-29 NOTE — ED Notes (Signed)
Went to pull cardizem; all 5 vials expired in blue pyxis, pharmacy made aware. Vials discarded per pharmacy

## 2019-09-29 NOTE — ED Notes (Signed)
Dr.Mansouraty at bedside

## 2019-09-29 NOTE — ED Notes (Addendum)
ED TO INPATIENT HANDOFF REPORT  ED Nurse Name and Phone #: Thurmond Butts [place] Name/Age/Gender Gordon Phillips 73 y.o. male Room/Bed: 014C/014C  Code Status   Code Status: Full Code  Home/SNF/Other Home Patient oriented to: self, place, time and situation Is this baseline? Yes   Triage Complete: Triage complete  Chief Complaint Odynophagia [R13.10] Atrial fibrillation, unspecified type (Central) [I48.91] Dysphagia, unspecified type [R13.10]  Triage Note Unable to eat drink or swallow meds since Thursday everything hurts  He cannot swallow his saliva  Pt was just seen Thursday  Here for the same and was worked up   Allergies Allergies  Allergen Reactions  . Flomax [Tamsulosin Hcl] Itching  . Relpax [Eletriptan] Other (See Comments) and Hypertension    Chest pain and sweating  . Other Palpitations    Any steroids cause heart palpitations  (tolerates low doses)    Level of Care/Admitting Diagnosis ED Disposition    ED Disposition Condition [facility] Area: [place] [[id number]]  Level of Care: Progressive [102]  Admit to Progressive based on following criteria: MULTISYSTEM THREATS such as stable sepsis, metabolic/electrolyte imbalance with or without encephalopathy that is responding to early treatment.  Covid Evaluation: Asymptomatic Screening Protocol (No Symptoms)  Diagnosis: Dysphagia [id number]  Admitting Physician: Rise Patience [phone]  Attending Physician: Rise Patience [[id number]]  PT Class (Do Not Modify): Observation [104]  PT Acc Code (Do Not Modify): Observation [10022]       B Medical/Surgery History Past Medical History:  Diagnosis Date  . ALLERGIC RHINITIS 01/25/2010  . Arthritis    "neck, back, shoulders," (02/20/2017)  . Chronic lower back pain   . High cholesterol   . Hx of cardiovascular stress test    a. Lexiscan Myoview 02/06/13:  EF 59%, no ischemia or scar  . Hx of echocardiogram    a.  Echo 02/03/13:   Mild LVH, mild focal basal septal hypertrophy, EF 50-55%, Gr 1 DD, mild LAE  . HYPERTENSION 01/25/2010  . HYPERTHYROIDISM 01/25/2010   S/P radioactive iodine "back in the 1980s" (02/20/2017)  . Impaired glucose tolerance   . 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 Obesity   . OSA on CPAP    Uses CPAP nightly  . Pericarditis 1980's  . 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  Marland Kitchen Vitiligo    Past Surgical History:  Procedure Laterality Date  . A-FLUTTER ABLATION N/A 04/23/2019   Procedure: A-FLUTTER ABLATION;  Surgeon: Deboraha Sprang, MD;  Location: [facility];  Service: Cardiovascular;  Laterality: N/A;  . CARDIOVERSION N/A 08/14/2018   Procedure: CARDIOVERSION;  Surgeon: [name], MD;  Location: [facility] ENDOSCOPY;  Service: Cardiovascular;  Laterality: N/A;  . INGUINAL HERNIA REPAIR Bilateral   . LUMBAR LAMINECTOMY    . TEE WITHOUT CARDIOVERSION N/A 08/14/2018   Procedure: TRANSESOPHAGEAL ECHOCARDIOGRAM (TEE);  Surgeon: [name], MD;  Location: [facility] ENDOSCOPY;  Service: Cardiovascular;  Laterality: N/A;  . UMBILICAL HERNIA REPAIR       A IV Location/Drains/Wounds Patient Lines/Drains/Airways Status   Active Line/Drains/Airways    Name:   Placement date:   Placement time:   Site:   Days:   Peripheral IV 09/28/19 Right Antecubital   09/28/19    1830    Antecubital   1   Sheath 04/23/19 Left Venous;Femoral   04/23/19    0917    Venous;Femoral  159   Sheath 04/23/19 Left Venous;Femoral   04/23/19    0917    Venous;Femoral   159   Wound / Incision (Open or Dehisced) 12/14/14 Laceration Face Left laceration 4cm, bleeding controlled   12/14/14    0412    Face   1750   Wound / Incision (Open or Dehisced) 12/14/14 Laceration   12/14/14    0413    -   1750          Intake/Output Last 24 hours  Intake/Output Summary (Last 24 hours) at 09/29/2019 1453 Last data filed at 09/29/2019 0331 Gross per 24 hour  Intake 1900  ml  Output -  Net 1900 ml    Labs/Imaging Results for orders placed or performed during the hospital encounter of 09/28/19 (from the past 48 hour(s))  CBC with Differential     Status: None   Collection Time: 09/28/19 11:33 PM  Result Value Ref Range   WBC 9.8 4.0 - 10.5 K/uL   RBC 4.52 4.22 - 5.81 MIL/uL   Hemoglobin 14.0 13.0 - 17.0 g/dL   HCT 41.8 39.0 - 52.0 %   MCV 92.5 80.0 - 100.0 fL   MCH 31.0 26.0 - 34.0 pg   MCHC 33.5 30.0 - 36.0 g/dL   RDW 13.4 11.5 - 15.5 %   Platelets 151 150 - 400 K/uL   nRBC 0.0 0.0 - 0.2 %   Neutrophils Relative % 55 %   Neutro Abs 5.4 1.7 - 7.7 K/uL   Lymphocytes Relative 34 %   Lymphs Abs 3.3 0.7 - 4.0 K/uL   Monocytes Relative 9 %   Monocytes Absolute 0.9 0.1 - 1.0 K/uL   Eosinophils Relative 2 %   Eosinophils Absolute 0.2 0.0 - 0.5 K/uL   Basophils Relative 0 %   Basophils Absolute 0.0 0.0 - 0.1 K/uL   Immature Granulocytes 0 %   Abs Immature Granulocytes 0.03 0.00 - 0.07 K/uL    Comment: Performed at [facility], [address] [place], [place] [id number]  Basic metabolic panel     Status: None   Collection Time: 09/28/19 11:33 PM  Result Value Ref Range   Sodium 140 135 - 145 mmol/L   Potassium 3.6 3.5 - 5.1 mmol/L   Chloride 107 98 - 111 mmol/L   CO2 22 22 - 32 mmol/L   Glucose, Bld 87 70 - 99 mg/dL   BUN 23 8 - 23 mg/dL   Creatinine, Ser 1.15 0.61 - 1.24 mg/dL   Calcium 8.9 8.9 - 10.3 mg/dL   GFR calc non Af Amer >60 >60 mL/min   GFR calc Af Amer >60 >60 mL/min   Anion gap 11 5 - 15    Comment: Performed at [facility] [address], [place], [place] [address]  POC CBG, ED     Status: None   Collection Time: 09/28/19 11:35 PM  Result Value Ref Range   Glucose-Capillary 82 70 - 99 mg/dL  SARS CORONAVIRUS 2 (TAT 6-24 HRS) Nasopharyngeal Nasopharyngeal Swab     Status: None   Collection Time: 09/28/19 11:53 PM   Specimen: Nasopharyngeal Swab  Result Value Ref Range   SARS Coronavirus 2 NEGATIVE NEGATIVE     Comment: (NOTE) SARS-CoV-2 target nucleic acids are NOT DETECTED. The SARS-CoV-2 RNA is generally detectable in upper and lower respiratory specimens during the acute phase of infection. Negative results do not preclude SARS-CoV-2 infection, do not rule out co-infections with other pathogens, and  should not be used as the sole basis for treatment or other patient management decisions. Negative results must be combined with clinical observations, patient history, and epidemiological information. The expected result is Negative. Fact Sheet for Patients: SugarRoll.be Fact Sheet for Healthcare Providers: https://www.woods-mathews.com/ This test is not yet approved or cleared by the [place] FDA and  has been authorized for detection and/or diagnosis of SARS-CoV-2 by FDA under an Emergency Use Authorization (EUA). This EUA will remain  in effect (meaning this test can be used) for the duration of the COVID-19 declaration under Section 56 4(b)(1) of the Act, 21 U.S.C. section 360bbb-3(b)(1), unless the authorization is terminated or revoked sooner. Performed at [facility], [place] [address], [place], [place] [address]   SARS Coronavirus 2 by RT PCR (hospital order, performed in [facility] hospital lab) Nasopharyngeal Nasopharyngeal Swab     Status: None   Collection Time: 09/28/19 11:53 PM   Specimen: Nasopharyngeal Swab  Result Value Ref Range   SARS Coronavirus 2 NEGATIVE NEGATIVE    Comment: (NOTE) If result is NEGATIVE SARS-CoV-2 target nucleic acids are NOT DETECTED. The SARS-CoV-2 RNA is generally detectable in upper and lower  respiratory specimens during the acute phase of infection. The lowest  concentration of SARS-CoV-2 viral copies this assay can detect is 250  copies / mL. A negative result does not preclude SARS-CoV-2 infection  and should not be used as the sole basis for treatment or other  patient management  decisions.  A negative result may occur with  improper specimen collection / handling, submission of specimen other  than nasopharyngeal swab, presence of viral mutation(s) within the  areas targeted by this assay, and inadequate number of viral copies  (<250 copies / mL). A negative result must be combined with clinical  observations, patient history, and epidemiological information. If result is POSITIVE SARS-CoV-2 target nucleic acids are DETECTED. The SARS-CoV-2 RNA is generally detectable in upper and lower  respiratory specimens dur ing the acute phase of infection.  Positive  results are indicative of active infection with SARS-CoV-2.  Clinical  correlation with patient history and other diagnostic information is  necessary to determine patient infection status.  Positive results do  not rule out bacterial infection or co-infection with other viruses. If result is PRESUMPTIVE POSTIVE SARS-CoV-2 nucleic acids MAY BE PRESENT.   A presumptive positive result was obtained on the submitted specimen  and confirmed on repeat testing.  While 2019 novel coronavirus  (SARS-CoV-2) nucleic acids may be present in the submitted sample  additional confirmatory testing may be necessary for epidemiological  and / or clinical management purposes  to differentiate between  SARS-CoV-2 and other Sarbecovirus currently known to infect humans.  If clinically indicated additional testing with an alternate test  methodology [phone]) is advised. The SARS-CoV-2 RNA is generally  detectable in upper and lower respiratory sp ecimens during the acute  phase of infection. The expected result is Negative. Fact Sheet for Patients:  StrictlyIdeas.no Fact Sheet for Healthcare Providers: BankingDealers.co.za This test is not yet approved or cleared by the [place] FDA and has been authorized for detection and/or diagnosis of SARS-CoV-2 by FDA under an  Emergency Use Authorization (EUA).  This EUA will remain in effect (meaning this test can be used) for the duration of the COVID-19 declaration under Section 564(b)(1) of the Act, 21 U.S.C. section 360bbb-3(b)(1), unless the authorization is terminated or revoked sooner. Performed at [facility], [place],  [place] [id number]   Basic metabolic panel     Status: Abnormal   Collection Time: 09/29/19  3:34 AM  Result Value Ref Range   Sodium 141 135 - 145 mmol/L   Potassium 3.5 3.5 - 5.1 mmol/L   Chloride 112 (H) 98 - 111 mmol/L   CO2 20 (L) 22 - 32 mmol/L   Glucose, Bld 82 70 - 99 mg/dL   BUN 22 8 - 23 mg/dL   Creatinine, Ser 1.19 0.61 - 1.24 mg/dL   Calcium 8.3 (L) 8.9 - 10.3 mg/dL   GFR calc non Af Amer >60 >60 mL/min   GFR calc Af Amer >60 >60 mL/min   Anion gap 9 5 - 15    Comment: Performed at [facility], [place] [address] [place], [place] [address]  CBC     Status: Abnormal   Collection Time: 09/29/19  3:34 AM  Result Value Ref Range   WBC 8.7 4.0 - 10.5 K/uL   RBC 4.30 4.22 - 5.81 MIL/uL   Hemoglobin 13.3 13.0 - 17.0 g/dL   HCT 40.5 39.0 - 52.0 %   MCV 94.2 80.0 - 100.0 fL   MCH 30.9 26.0 - 34.0 pg   MCHC 32.8 30.0 - 36.0 g/dL   RDW 13.6 11.5 - 15.5 %   Platelets 133 (L) 150 - 400 K/uL    Comment: REPEATED TO VERIFY   nRBC 0.0 0.0 - 0.2 %    Comment: Performed at [facility], [place] [address], [place], [place] [address]  TSH     Status: None   Collection Time: 09/29/19  3:34 AM  Result Value Ref Range   TSH 0.773 0.350 - 4.500 uIU/mL    Comment: Performed by a 3rd Generation assay with a functional sensitivity of <=0.01 uIU/mL. Performed at [facility], [place] [address] [place], [place] [address]   Troponin I (High Sensitivity)     Status: None   Collection Time: 09/29/19  3:34 AM  Result Value Ref Range   Troponin I (High Sensitivity) 9 <18 ng/L    Comment: (NOTE) Elevated high sensitivity troponin I (hsTnI) values and  significant  changes across serial measurements may suggest ACS but many other  chronic and acute conditions are known to elevate hsTnI results.  Refer to the "Links" section for chest pain algorithms and additional  guidance. Performed at [facility], [place] [address] [place], [place] [address]   CBG monitoring, ED     Status: None   Collection Time: 09/29/19  3:41 AM  Result Value Ref Range   Glucose-Capillary 72 70 - 99 mg/dL  CBG monitoring, ED     Status: None   Collection Time: 09/29/19  6:06 AM  Result Value Ref Range   Glucose-Capillary 90 70 - 99 mg/dL  CBG monitoring, ED     Status: None   Collection Time: 09/29/19 11:50 AM  Result Value Ref Range   Glucose-Capillary 70 70 - 99 mg/dL   Comment 1 Notify RN    Comment 2 Document in Chart    Ct Soft Tissue Neck W Contrast  Result Date: 09/28/2019 CLINICAL DATA:  Dysphagia EXAM: CT NECK WITH CONTRAST TECHNIQUE: Multidetector CT imaging of the neck was performed using the standard protocol following the bolus administration of intravenous contrast. CONTRAST:  159mL OMNIPAQUE IOHEXOL 300 MG/ML  SOLN COMPARISON:  None. FINDINGS: PHARYNX AND LARYNX: --Nasopharynx: Fossae of Rosenmuller are clear. Normal adenoid tonsils for age. --Oral cavity  and oropharynx: The palatine and lingual tonsils are normal. The visible oral cavity and floor of mouth are normal. --Hypopharynx: Normal vallecula and pyriform sinuses. --Larynx: Normal epiglottis and pre-epiglottic space. Normal aryepiglottic and vocal folds. --Retropharyngeal space: No abscess, effusion or lymphadenopathy. SALIVARY GLANDS: --Parotid: Both parotid glands are enlarged without focal abnormality. --Submandibular: Both are enlarged without focal abnormality. --Sublingual: Normal. No ranula or other visible lesion of the base of tongue and floor of mouth. THYROID: Normal. LYMPH NODES: No enlarged or abnormal density lymph nodes. VASCULAR: Major cervical vessels are patent. LIMITED  INTRACRANIAL: Normal. VISUALIZED ORBITS: Normal. MASTOIDS AND VISUALIZED PARANASAL SINUSES: No fluid levels or advanced mucosal thickening. No mastoid effusion. SKELETON: No bony spinal canal stenosis. No lytic or blastic lesions. UPPER CHEST: Clear. OTHER: None. IMPRESSION: 1. No acute abnormality of the cervical soft tissues. 2. 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 Electronically Signed   By: Ulyses Jarred M.D.   On: 09/28/2019 21:35   Dg Chest Portable 1 View  Result Date: 09/29/2019 CLINICAL DATA:  Atrial fibrillation and difficulty swallowing for several hours EXAM: PORTABLE CHEST 1 VIEW COMPARISON:  09/26/2019 FINDINGS: Cardiac shadows within normal limits. Skin fold is noted over the right chest. The lungs are well aerated bilaterally. Very mild left basilar atelectasis is seen. No bony abnormality is noted. IMPRESSION: Mild left basilar atelectasis. Electronically Signed   By: Inez Catalina M.D.   On: 09/29/2019 00:35    Pending Labs Unresulted Labs (From admission, onward)   None      Vitals/Pain Today's Vitals   09/29/19 1245 09/29/19 1300 09/29/19 1315 09/29/19 1330  BP:      Pulse: (!) 104 96 (!) 107 83  Resp: 14 18 (!) 37 (!) 21  Temp:      TempSrc:      SpO2: 95% 97% 99% 93%  Weight:      Height:      PainSc:        Isolation Precautions No active isolations  Medications Medications  acetaminophen (TYLENOL) tablet 650 mg ( Oral MAR Unhold 09/29/19 1017)    Or  acetaminophen (TYLENOL) suppository 650 mg ( Rectal MAR Unhold 09/29/19 1017)  ondansetron (ZOFRAN) tablet 4 mg ( Oral MAR Unhold 09/29/19 1017)    Or  ondansetron (ZOFRAN) injection 4 mg ( Intravenous MAR Unhold 09/29/19 1017)  dextrose 5 %-0.9 % sodium chloride infusion ( Intravenous New Bag/Given 09/29/19 1216)  metoprolol tartrate (LOPRESSOR) injection 5 mg (5 mg Intravenous Given 09/29/19 1213)  0.9 %  sodium chloride  infusion ( Intravenous Stopped 09/28/19 2339)  iohexol (OMNIPAQUE) 300 MG/ML solution 100 mL (100 mLs Intravenous Contrast Given 09/28/19 2102)  sucralfate (CARAFATE) 1 GM/10ML suspension 1 g (1 g Oral Given 09/28/19 2204)  pantoprazole (PROTONIX) injection 40 mg (40 mg Intravenous Given 09/28/19 2333)  diltiazem (CARDIZEM) injection 20 mg (20 mg Intravenous Given 09/29/19 0035)  lactated ringers infusion (1,000 mLs Intravenous New Bag/Given 09/29/19 0817)    Mobility walks Low fall risk   Focused Assessments    R Recommendations: See Admitting Provider Note  Report given to: Chauncey Fischer RN  Additional Notes:

## 2019-09-29 NOTE — ED Notes (Signed)
Spoke to Graham in micro; covid swab to be changed to rapid testing

## 2019-09-29 NOTE — Consult Note (Signed)
Gastroenterology Inpatient Consultation   Attending Requesting Consult Lorella Nimrod, [facility] Day: 2  Reason for Consult Odynophagia + Dysphagia   History of Present Illness  Gordon Phillips is a 73 y.o. male with a pmh significant for A. fib status post ablation only on aspirin, hypertension, Graves' disease status post RAI, MGUS, OSA on CPAP, recent diagnosis of prediabetes, history of prior stroke, vitiligo, colon polyps.  The GI service is consulted for evaluation and management of progressive odynophagia and dysphagia with intolerance of secretions.  The patient over the course of the last week has been feeling progressively worse.  He has had fatigue and decreased energy.  The patient eventually presented with progressive cough and concern for significant odynophagia and sore throat to the ED a few days ago.  He was given a steroid injection and discharged.  He was not able to tolerate significant food intake or liquid intake since his discharge.  As the pain which she describes as a pins and needle sensation in the back of his throat progressed he came in for further evaluation.  Denies any fevers or chills.  He has not felt any palpitations or chest pain.  He does not have a history of heartburn or pyrosis.  He has never had symptoms like this previously.  He was recently diagnosed with prediabetes but has not been initiated on medication therapy as of yet.  No other new medications have been initiated.  No one has been sick around him either.  He has been tested for Covid on his last ED visit and retested today and this morning it has returned as negative.  He was previously tested for strep throat and it was negative.  He underwent a cross-sectional CT of the neck and sinuses which show evidence of parotid gland enlargement and submandibular gland enlargement but no evidence of a retropharyngeal abscess or significant obstructive process occurring in the neck at this time.   Patient was given Carafate earlier this morning and senses that he can tolerate a little bit more of his saliva passing but he still has a spit up back with him and used it on multiple occasions during our interview this morning.  The patient was found to be in A. fib with RVR and has received IV medications.  He is hemodynamically stable otherwise.  He is on aspirin which he takes for back pain and he takes up to 500 to 1000 mg of aspirin per day.  He is not on a PPI.  Patient has never had an upper endoscopy but has had a prior colonoscopy.  GI Review of Systems Positive as above including currently nausea Negative for reflux, coffee-ground emesis, hematemesis, change in bowel habits, melena, hematochezia   Review of Systems  General: Denies current fevers/chills HEENT: Denies oral lesions Cardiovascular: Denies chest pain/palpitations Pulmonary: Denies shortness of breath Gastroenterological: See HPI Genitourinary: Denies darkened urine Hematological: Denies easy bruising/bleeding Endocrine: Denies temperature intolerance Dermatological: Denies jaundice Psychological: Mood is anxious   Histories  Past Medical History Past Medical History:  Diagnosis Date   ALLERGIC RHINITIS 01/25/2010   Arthritis    "neck, back, shoulders," (02/20/2017)   Chronic lower back pain    High cholesterol    Hx of cardiovascular stress test    a. [place] 02/06/13:  EF 59%, no ischemia or scar   Hx of echocardiogram    a.  Echo 02/03/13:  Mild LVH, mild focal basal septal hypertrophy, EF 50-55%, Gr 1  DD, mild LAE   HYPERTENSION 01/25/2010   HYPERTHYROIDISM 01/25/2010   S/P radioactive iodine "back in the 1980s" (02/20/2017)   Impaired glucose tolerance    MGUS (monoclonal gammopathy of unknown significance)    "an autoimmune thing"   Obesity    OSA on CPAP    Uses CPAP nightly   Pericarditis 1980's   Stroke ([place])    Old L basal ganglia infarct by CT 01/2013.   Vitiligo    Past  Surgical History:  Procedure Laterality Date   A-FLUTTER ABLATION N/A 04/23/2019   Procedure: A-FLUTTER ABLATION;  Surgeon: Deboraha Sprang, MD;  Location: [facility];  Service: Cardiovascular;  Laterality: N/A;   CARDIOVERSION N/A 08/14/2018   Procedure: CARDIOVERSION;  Surgeon: [name], MD;  Location: [facility];  Service: Cardiovascular;  Laterality: N/A;   INGUINAL HERNIA REPAIR Bilateral    LUMBAR LAMINECTOMY     TEE WITHOUT CARDIOVERSION N/A 08/14/2018   Procedure: TRANSESOPHAGEAL ECHOCARDIOGRAM (TEE);  Surgeon: [name], MD;  Location: [facility];  Service: Cardiovascular;  Laterality: N/A;   UMBILICAL HERNIA REPAIR      Allergies Allergies  Allergen Reactions   Flomax [Tamsulosin Hcl] Itching   Relpax [Eletriptan] Other (See Comments) and Hypertension    Chest pain and sweating   Other Palpitations    Any steroids cause heart palpitations  (tolerates low doses)    Family History Family History  Problem Relation Age of Onset   Stroke Father        Passed away in his 32s   Thyroid disease Sister        goiter   Sudden death Brother        ? Drug use, no autopsy   Throat cancer Brother    Colon cancer Neg Hx    Esophageal cancer Neg Hx    Rectal cancer Neg Hx    Stomach cancer Neg Hx    The patient's FH is negative for IBD/IBS/Liver Disease/GI Malignancies.  Social History Social History   Socioeconomic History   Marital status: Married    Spouse name: Not on file   Number of children: 2   Years of education: Not on file   Highest education level: Not on file  Occupational History   Occupation: Environmental consultant strain: Not on file   Food insecurity    Worry: Not on file    Inability: Not on file   Transportation needs    Medical: Not on file    Non-medical: Not on file  Tobacco Use   Smoking status: Former Smoker    Packs/day: 0.50    Years: 8.00    Pack years: 4.00    Types:  Cigarettes    Quit date: 1968    Years since quitting: 52.9   Smokeless tobacco: Never Used  Substance and Sexual Activity   Alcohol use: Yes    Comment: 02/20/2017 "couple drinks/month on average"   Drug use: No   Sexual activity: Yes  Lifestyle   Physical activity    Days per week: Not on file    Minutes per session: Not on file   Stress: Not on file  Relationships   Social connections    Talks on phone: Not on file    Gets together: Not on file    Attends religious service: Not on file    Active member of club or organization: Not on file    Attends meetings of clubs  or organizations: Not on file    Relationship status: Not on file   Intimate partner violence    Fear of current or ex partner: Not on file    Emotionally abused: Not on file    Physically abused: Not on file    Forced sexual activity: Not on file  Other Topics Concern   Not on file  Social History Narrative   Not on file    Medications  Home Medications No current facility-administered medications on file prior to encounter.    Current Outpatient Medications on File Prior to Encounter  Medication Sig Dispense Refill   acetaminophen-codeine (TYLENOL #3) 300-30 MG tablet Take 1 tablet by mouth every 6 (six) hours as needed for moderate pain. 10 tablet 0   Aloe-Sodium Chloride (AYR SALINE NASAL GEL NA) Place 1 application into the nose at bedtime.     Aromatic Inhalants (VICKS VAPOINHALER) INHA Place 1 puff into both nostrils as needed (for congestion or stuffiness).     Aspirin-Caffeine (BAYER BACK & BODY) 500-32.5 MG TABS Take 2 tablets by mouth every morning.     AYR SALINE NASAL DROPS NA Place 2 sprays into both nostrils at bedtime.      diltiazem (CARDIZEM CD) 120 MG 24 hr capsule Take 1 capsule (120 mg total) by mouth daily. (Patient taking differently: Take 120 mg by mouth at bedtime. ) 90 capsule 3   doxazosin (CARDURA) 2 MG tablet Take 1 tablet (2 mg total) by mouth at bedtime. 90  tablet 3   hydrocortisone cream 1 % Apply 1 application topically 2 (two) times daily as needed for itching.     metoprolol succinate (TOPROL-XL) 100 MG 24 hr tablet Take 1 tablet (100 mg total) by mouth at bedtime. Take with or immediately following a meal. 90 tablet 3   olmesartan (BENICAR) 40 MG tablet TAKE 1 TABLET BY MOUTH EVERY DAY (Patient taking differently: Take 40 mg by mouth at bedtime. ) 90 tablet 1   OVER THE COUNTER MEDICATION Take 1 tablet by mouth 2 (two) times daily. OTC - Super Beta Prostate     pravastatin (PRAVACHOL) 40 MG tablet Take 1 tablet (40 mg total) by mouth at bedtime. 90 tablet 3   PRESCRIPTION MEDICATION Inhale into the lungs at bedtime. CPAP     sildenafil (VIAGRA) 100 MG tablet Take 100 mg by mouth daily as needed for erectile dysfunction.      vitamin C (ASCORBIC ACID) 500 MG tablet Take 1,000 mg by mouth every morning.     cyclobenzaprine (FLEXERIL) 5 MG tablet Take 1 tablet (5 mg total) by mouth 3 (three) times daily as needed. (Patient not taking: Reported on 09/28/2019) 10 tablet 0   glucose blood test strip Use to check blood sugar once a day 100 each 12   Scheduled Inpatient Medications  metoprolol tartrate  5 mg Intravenous Q6H   Continuous Inpatient Infusions  dextrose 5 % and 0.9% NaCl 75 mL/hr at 09/29/19 0337   PRN Inpatient Medications acetaminophen **OR** acetaminophen, ondansetron **OR** ondansetron (ZOFRAN) IV   Physical Examination  BP 105/89 (BP Location: Right Arm)    Pulse (!) 120    Temp 98.6 F (37 C) (Oral)    Resp (!) 26    Ht 6\' 2"  (1.88 m)    Wt 127 kg    SpO2 95%    BMI 35.95 kg/m  GEN: Uncomfortable appearing on the side of his bed with spit up bag, nontoxic PSYCH: Cooperative, without pressured speech  EYE: Conjunctivae pink, sclerae anicteric ENT: MMM, without oral ulcers NECK: Enlarged submandibular glands present but they are nontender CV: Irregularly irregular, tachycardic, without rubs or gallops RESP:  Decreased breath sounds at the bases bilaterally but no wheezing present GI: NABS, obese, rounded, NT, without rebound or guarding, reducible ventral diastases MSK/EXT: Bilateral lower extremity edema present SKIN: Vitiligo present but no jaundice NEURO:  Alert & Oriented x 3, no focal deficits   Review of Data  I reviewed the following data at the time of this encounter:  Laboratory Studies   Recent Labs  Lab 09/29/19 0334  NA 141  K 3.5  CL 112*  CO2 20*  BUN 22  CREATININE 1.19  GLUCOSE 82  CALCIUM 8.3*   No results for input(s): AST, ALT, GGT, ALKPHOS in the last 168 hours.  Invalid input(s): TBILI, CONJBILI, ALB  Recent Labs  Lab 09/26/19 0717 09/28/19 2333 09/29/19 0334  WBC 7.5 9.8 8.7  HGB 13.7 14.0 13.3  HCT 40.6 41.8 40.5  PLT 138* 151 133*   No results for input(s): APTT, INR in the last 168 hours. Computed MELD-Na score unavailable. Necessary lab results were not found in the last year. Computed MELD score unavailable. Necessary lab results were not found in the last year.  Imaging Studies  CT neck IMPRESSION: 1. No acute abnormality of the cervical soft tissues. 2. 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  Chest x-ray IMPRESSION: Mild left basilar atelectasis.  GI Procedures and Studies  2019 colonoscopy - One 7 mm polyp in the transverse colon, removed with a cold snare. Resected and retrieved. - The examination was otherwise normal on direct and retroflexion views.   Assessment  Gordon Phillips is a 73 y.o. male with a pmh significant for A. fib status post ablation only on aspirin, hypertension, Graves' disease status post RAI, MGUS, OSA on CPAP, recent diagnosis of prediabetes, history of prior stroke, vitiligo, colon polyps.  The GI service is consulted for evaluation and management of progressive odynophagia and dysphagia with intolerance of secretions.  The patient  is hemodynamically stable although he is in slight tachycardia with A. fib.  The patient's clinical status is such that it does seem he has some sort of obstructive process.  From what he describes is not clearly a true globus sensation and there is no doubt that he is having spit up at this time so I think an obstructive process is occurring.  With that being said without a prior history of having significant heartburn or reflux or symptoms of indigestion I find it very hard to believe that he will have a large stricture.  However he does have autoimmune disorder of vitiligo and may be at risk of having esophageal webs although at his age I would have expected we would have found something at some point in time prior to just things occurring now.  His prodromal symptoms suggest an inflammatory process or some sort of infectious process.  With recent diagnosis of diabetes there is a chance that his sialosis could be a result of his underlying new diagnosis of diabetes.  Other diagnoses such as sarcoidosis or IgG4 related disease may need to be considered in him as well.  I think a diagnostic and potential therapeutic endoscopy is reasonable to pursue as we evaluate his esophagus for the significant odynophagia.  However, I think there is a good chance that we may not find anything that is a true obstructive  pathology within the esophagus that may be causing issues such that an ENT evaluation and potential infectious disease evaluation may need to be considered.  The risks and benefits of endoscopic evaluation were discussed with the patient; these include but are not limited to the risk of perforation, infection, bleeding, missed lesions, lack of diagnosis, severe illness requiring hospitalization, as well as anesthesia and sedation related illnesses.  The patient is agreeable to proceed.  I will attempt to get a case with anesthesia assistance due to the significant sialosis and parotid gland enlargement and  submandibular gland enlargement to ensure that we have an airway that is intact.  May be reasonable to consider a nasopharyngeal scope first and then potentially therapeutic scope if an obstructive process is then found.  I will discussed this case with Dr. Tarri Glenn and with Dr. Fuller Plan who are the inpatient GI team this morning.  As long as he is hemodynamically stable and anesthesia is comfortable with moving forward we will try to do his EGD later this morning.   Plan/Recommendations  Please maintain NPO Status IV PPI 40 mg x 1 Hold Heparin/VTE PPx May need ENT/ID evaluation if workup unremarkable/unrevealing.   Thank you for this consult.  We will continue to follow.  Please page/call with questions or concerns.   Justice Britain, MD [facility] # [phone]

## 2019-09-29 NOTE — Anesthesia Preprocedure Evaluation (Signed)
Anesthesia Evaluation  Patient identified by MRN, date of birth, ID band Patient awake    Reviewed: Allergy & Precautions, H&P , NPO status , Patient's Chart, lab work & pertinent test results, reviewed documented beta blocker date and time   Airway Mallampati: II  TM Distance: >3 FB Neck ROM: full    Dental   Pulmonary sleep apnea , former smoker,    Pulmonary exam normal breath sounds clear to auscultation       Cardiovascular hypertension, Pt. on medications and Pt. on home beta blockers + angina Normal cardiovascular exam+ dysrhythmias Atrial Fibrillation  Rhythm:regular Rate:Normal  Echo 08/2018 - Left ventricle: The cavity size was normal. Wall thickness was normal. Systolic function was mildly to moderately reduced. The estimated ejection fraction was in the range of 40% to 45%. - Aortic valve: No evidence of vegetation. - Mitral valve: No evidence of vegetation. - Left atrium: The atrium was dilated. No evidence of thrombus in the appendage. - Right ventricle: The cavity size was dilated. Wall thickness was normal. - Right atrium: The atrium was dilated. No evidence of thrombus in the atrial cavity or appendage. - Atrial septum: No defect or patent foramen ovale was identified. Echo contrast study showed no right-to-left atrial level shunt, following an increase in RA pressure induced by provocative maneuvers. - Tricuspid valve: No evidence of vegetation. - Pulmonic valve: No evidence of vegetation. - Superior vena cava: The study excluded a thrombus.   Neuro/Psych CVA    GI/Hepatic   Endo/Other  Hyperthyroidism obese  Renal/GU      Musculoskeletal  (+) Arthritis ,   Abdominal (+) + obese,   Peds  Hematology   Anesthesia Other Findings   Reproductive/Obstetrics                             Anesthesia Physical  Anesthesia Plan  ASA: III  Anesthesia Plan: MAC   Post-op Pain  Management:    Induction: Intravenous  PONV Risk Score and Plan: 1 and Ondansetron, Treatment may vary due to age or medical condition and Propofol infusion  Airway Management Planned: Natural Airway  Additional Equipment:   Intra-op Plan:   Post-operative Plan:   Informed Consent: I have reviewed the patients History and Physical, chart, labs and discussed the procedure including the risks, benefits and alternatives for the proposed anesthesia with the patient or authorized representative who has indicated his/her understanding and acceptance.     Dental advisory given  Plan Discussed with: CRNA  Anesthesia Plan Comments:         Anesthesia Quick Evaluation

## 2019-09-29 NOTE — Progress Notes (Signed)
Gordon Phillips is a 73 y.o. male with history of atrial flutter status post recent ablation on Cardizem metoprolol off anticoagulation, prediabetes, Graves' disease status post radioactive iodine therapy has been experiencing difficulty swallowing over the last 4 to 5 days.  Has not had any tongue swelling or any chest pain fever chills or shortness of breath has not had any new medications.  Patient states when he tries to swallow something it feels like some pins-and-needles are in the throat and is not able to swallow anything at all with intense pain.  GI was consulted and patient underwent EGD today.  Which shows multiple ulcerations around epiglottis area, most likely the cause of his symptoms. -Non-obstructing Schatzki ring. - A small hiatal hernia. - Erosive gastropathy with no bleeding. Multiple biopsies were taken. The start him on twice daily Protonix. Full liquid diet was started which can be progressed if tolerated.  Patient might need ENT consult tomorrow morning.

## 2019-09-29 NOTE — H&P (Signed)
History and Physical    Gordon Phillips [id number] DOB: 1946/03/22 DOA: 09/28/2019  PCP: Vivi Barrack, MD  Patient coming from: Home.  Chief Complaint: Difficulty swallowing.  HPI: Gordon Phillips is a 73 y.o. male with history of atrial flutter status post recent ablation on Cardizem metoprolol off anticoagulation, prediabetes, Graves' disease status post radioactive iodine therapy has been experiencing difficulty swallowing over the last 4 to 5 days.  Has not had any tongue swelling or any chest pain fever chills or shortness of breath has not had any new medications.  Patient states when he tries to swallow something it feels like some pins-and-needles are in the throat and is not able to swallow anything at all with intense pain.  Had come to the ER 2 days ago was treated for possible pharyngitis.  Patient went home and came back because of severe pain.  Patient was not able to take his medication.  ED Course: In the ER patient had a CT soft neck tissue which did not show anything acute.  While in the ER patient went into A. fib with RVR.  Has been given 1 dose of Cardizem bolus following which heart rate improved but still tachycardic.  On-call gastroenterologist Dr. Rush Landmark has been consulted planning is for EGD in the morning.  We will keep patient n.p.o. and keep patient on scheduled dose of IV metoprolol as tolerated.  Labs show COVID-19 test is negative.  Hemoglobin 13 creatinine 1.1 high-sensitivity troponin is 9 TSH 0.77.  Review of Systems: As per HPI, rest all negative.   Past Medical History:  Diagnosis Date   ALLERGIC RHINITIS 01/25/2010   Arthritis    "neck, back, shoulders," (02/20/2017)   Chronic lower back pain    High cholesterol    Hx of cardiovascular stress test    a. [place] 02/06/13:  EF 59%, no ischemia or scar   Hx of echocardiogram    a.  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   Vitiligo     Past Surgical History:  Procedure Laterality Date   A-FLUTTER ABLATION N/A 04/23/2019   Procedure: A-FLUTTER ABLATION;  Surgeon: Deboraha Sprang, MD;  Location: [facility];  Service: Cardiovascular;  Laterality: N/A;   CARDIOVERSION N/A 08/14/2018   Procedure: CARDIOVERSION;  Surgeon: [name], MD;  Location: [facility];  Service: Cardiovascular;  Laterality: N/A;   INGUINAL HERNIA REPAIR Bilateral    LUMBAR LAMINECTOMY     TEE WITHOUT CARDIOVERSION N/A 08/14/2018   Procedure: TRANSESOPHAGEAL ECHOCARDIOGRAM (TEE);  Surgeon: [name], MD;  Location: [facility];  Service: Cardiovascular;  Laterality: N/A;   UMBILICAL HERNIA REPAIR       reports that he quit smoking about 52 years ago. His smoking use included cigarettes. He has a 4.00 pack-year smoking history. He has never used smokeless tobacco. He reports current alcohol use. He reports that he does not use drugs.  Allergies  Allergen Reactions   Flomax [Tamsulosin Hcl] Itching   Relpax [Eletriptan]  Other (See Comments) and Hypertension    Chest pain and sweating   Other Palpitations    Any steroids cause heart palpitations  (tolerates low doses)    Family History  Problem Relation Age of Onset   Stroke Father        Passed away in his 59s   Thyroid disease Sister        goiter   Sudden death Brother        ? Drug use, no autopsy   Throat cancer Brother    Colon cancer Neg Hx    Esophageal cancer Neg Hx    Rectal cancer Neg Hx    Stomach cancer Neg Hx     Prior to Admission medications   Medication Sig Start Date End Date  Taking? Authorizing Provider  acetaminophen-codeine (TYLENOL #3) 300-30 MG tablet Take 1 tablet by mouth every 6 (six) hours as needed for moderate pain. 08/26/19  Yes Vivi Barrack, MD  Aloe-Sodium Chloride (AYR SALINE NASAL GEL NA) Place 1 application into the nose at bedtime.   Yes [provider]  Aromatic Inhalants (VICKS VAPOINHALER) INHA Place 1 puff into both nostrils as needed (for congestion or stuffiness).   Yes [provider]  Aspirin-Caffeine (BAYER BACK & BODY) 500-32.5 MG TABS Take 2 tablets by mouth every morning.   Yes [provider]  AYR SALINE NASAL DROPS NA Place 2 sprays into both nostrils at bedtime.    Yes [provider]  diltiazem (CARDIZEM CD) 120 MG 24 hr capsule Take 1 capsule (120 mg total) by mouth daily. Patient taking differently: Take 120 mg by mouth at bedtime.  01/07/19  Yes Vivi Barrack, MD  doxazosin (CARDURA) 2 MG tablet Take 1 tablet (2 mg total) by mouth at bedtime. 01/07/19  Yes Vivi Barrack, MD  hydrocortisone cream 1 % Apply 1 application topically 2 (two) times daily as needed for itching.   Yes [provider]  metoprolol succinate (TOPROL-XL) 100 MG 24 hr tablet Take 1 tablet (100 mg total) by mouth at bedtime. Take with or immediately following a meal. 01/07/19  Yes Vivi Barrack, MD  olmesartan (BENICAR) 40 MG tablet TAKE 1 TABLET BY MOUTH EVERY DAY Patient taking differently: Take 40 mg by mouth at bedtime.  04/11/19  Yes Vivi Barrack, MD  OVER THE COUNTER MEDICATION Take 1 tablet by mouth 2 (two) times daily. OTC - Super Beta Prostate   Yes [provider]  pravastatin (PRAVACHOL) 40 MG tablet Take 1 tablet (40 mg total) by mouth at bedtime. 01/07/19  Yes Vivi Barrack, MD  PRESCRIPTION MEDICATION Inhale into the lungs at bedtime. CPAP   Yes [provider]  sildenafil (VIAGRA) 100 MG tablet Take 100 mg by mouth daily as needed for erectile dysfunction.    Yes [provider]  vitamin C (ASCORBIC ACID) 500 MG tablet Take 1,000 mg by mouth every morning.   Yes [provider]  cyclobenzaprine (FLEXERIL) 5 MG tablet Take 1 tablet (5 mg total) by mouth 3 (three) times daily as needed. Patient not taking: Reported on 09/28/2019 08/13/19   Drenda Freeze, MD  glucose blood test strip Use to check blood sugar once a day 09/19/19   Philemon Kingdom, MD    Physical Exam: Constitutional: Moderately built and nourished. Vitals:   09/28/19 2352 09/29/19 0000 09/29/19 0015 09/29/19 0116  BP: 124/81 123/75 119/71 (!) 115/50  Pulse: 73     Resp: Marland Kitchen)  28 17 14  (!) 31  Temp:      TempSrc:      SpO2: 98% 95% 96% 95%  Weight:      Height:       Eyes: Anicteric no pallor. ENMT: No discharge from the ears eyes nose or mouth. Neck: No mass felt.  No neck rigidity no stridor. Respiratory: No rhonchi or crepitations. Cardiovascular: S1-S2 heard. Abdomen: Soft nontender bowel sounds present. Musculoskeletal: No edema.  No joint effusion. Skin: Vitiligo. Neurologic: Alert awake oriented to time place and person.  Moves all extremities. Psychiatric: Appears normal per normal affect.   Labs on Admission: I have personally reviewed following labs and imaging studies  CBC: Recent Labs  Lab 09/26/19 0717 09/28/19 2333  WBC 7.5 9.8  NEUTROABS  --  5.4  HGB 13.7 14.0  HCT 40.6 41.8  MCV 90.8 92.5  PLT 138* [id number]   Basic Metabolic Panel: Recent Labs  Lab 09/26/19 0717 09/28/19 2333  NA 136 140  K 3.7 3.6  CL 102 107  CO2 23 22  GLUCOSE 113* 87  BUN 13 23  CREATININE 1.10 1.15  CALCIUM 9.2 8.9   GFR: Estimated Creatinine Clearance: 82.2 mL/min (by C-G formula based on SCr of 1.15 mg/dL). Liver Function Tests: No results for input(s): AST, ALT, ALKPHOS, BILITOT, PROT, ALBUMIN in the last 168 hours. No results for input(s): LIPASE, AMYLASE in the last 168 hours. No results for input(s): AMMONIA in the last 168 hours. Coagulation Profile: No  results for input(s): INR, PROTIME in the last 168 hours. Cardiac Enzymes: No results for input(s): CKTOTAL, CKMB, CKMBINDEX, TROPONINI in the last 168 hours. BNP (last 3 results) No results for input(s): PROBNP in the last 8760 hours. HbA1C: No results for input(s): HGBA1C in the last 72 hours. CBG: Recent Labs  Lab 09/28/19 2335  GLUCAP 82   Lipid Profile: No results for input(s): CHOL, HDL, LDLCALC, TRIG, CHOLHDL, LDLDIRECT in the last 72 hours. Thyroid Function Tests: No results for input(s): TSH, T4TOTAL, FREET4, T3FREE, THYROIDAB in the last 72 hours. Anemia Panel: No results for input(s): VITAMINB12, FOLATE, FERRITIN, TIBC, IRON, RETICCTPCT in the last 72 hours. Urine analysis:    Component Value Date/Time   COLORURINE YELLOW 02/03/2013 0140   APPEARANCEUR CLEAR 02/03/2013 0140   LABSPEC <=1.005 03/26/2019 1816   PHURINE >=9.0 03/26/2019 1816   GLUCOSEU 500 (A) 03/26/2019 1816   HGBUR LARGE (A) 03/26/2019 1816   BILIRUBINUR LARGE (A) 03/26/2019 1816   BILIRUBINUR n 04/27/2014 1123   KETONESUR >=160 (A) 03/26/2019 1816   PROTEINUR >=300 (A) 03/26/2019 1816   UROBILINOGEN >=8.0 03/26/2019 1816   NITRITE POSITIVE (A) 03/26/2019 1816   LEUKOCYTESUR LARGE (A) 03/26/2019 1816   Sepsis Labs: @LABRCNTIP (procalcitonin:4,lacticidven:4) ) Recent Results (from the past 240 hour(s))  Group A Strep by PCR     Status: None   Collection Time: 09/26/19  8:30 AM   Specimen: Throat; Sterile Swab  Result Value Ref Range Status   Group A Strep by PCR NOT DETECTED NOT DETECTED Final    Comment: Performed at [facility], [address] [place], [place] [address]  Novel Coronavirus, NAA (Hosp order, Send-out to Ref Lab; TAT 18-24 hrs     Status: None   Collection Time: 09/26/19  8:30 AM   Specimen: Throat; Respiratory  Result Value Ref Range Status   SARS-CoV-2, NAA NOT DETECTED NOT DETECTED Final    Comment: (NOTE) This nucleic acid amplification test was developed and  its  performance characteristics determined by [employer]. Nucleic acid amplification tests include PCR and TMA. This test has not been FDA cleared or approved. This test has been authorized by FDA under an Emergency Use Authorization (EUA). This test is only authorized for the duration of time the declaration that circumstances exist justifying the authorization of the emergency use of in vitro diagnostic tests for detection of SARS-CoV-2 virus and/or diagnosis of COVID-19 infection under section 564(b)(1) of the Act, 21 U.S.C. [id number]) (1), unless the authorization is terminated or revoked sooner. When diagnostic testing is negative, the possibility of a false negative result should be considered in the context of a patient's recent exposures and the presence of clinical signs and symptoms consistent with COVID-19. An individual without symptoms of COVID- 19 and who is not shedding SARS-CoV-2 vi rus would expect to have a negative (not detected) result in this assay. Performed At: [facility] [address] [place], [place] [id number] Rush Farmer MD [id number]    [place]  Final    Comment: Performed at [facility], [place] [address], [place], [place] [address]  SARS Coronavirus 2 by RT PCR (hospital order, performed in [facility] hospital lab) Nasopharyngeal Nasopharyngeal Swab     Status: None   Collection Time: 09/28/19 11:53 PM   Specimen: Nasopharyngeal Swab  Result Value Ref Range Status   SARS Coronavirus 2 NEGATIVE NEGATIVE Final    Comment: (NOTE) If result is NEGATIVE SARS-CoV-2 target nucleic acids are NOT DETECTED. The SARS-CoV-2 RNA is generally detectable in upper and lower  respiratory specimens during the acute phase of infection. The lowest  concentration of SARS-CoV-2 viral copies this assay can detect is 250  copies / mL. A negative result does not preclude SARS-CoV-2 infection  and should not be used as  the sole basis for treatment or other  patient management decisions.  A negative result may occur with  improper specimen collection / handling, submission of specimen other  than nasopharyngeal swab, presence of viral mutation(s) within the  areas targeted by this assay, and inadequate number of viral copies  (<250 copies / mL). A negative result must be combined with clinical  observations, patient history, and epidemiological information. If result is POSITIVE SARS-CoV-2 target nucleic acids are DETECTED. The SARS-CoV-2 RNA is generally detectable in upper and lower  respiratory specimens dur ing the acute phase of infection.  Positive  results are indicative of active infection with SARS-CoV-2.  Clinical  correlation with patient history and other diagnostic information is  necessary to determine patient infection status.  Positive results do  not rule out bacterial infection or co-infection with other viruses. If result is PRESUMPTIVE POSTIVE SARS-CoV-2 nucleic acids MAY BE PRESENT.   A presumptive positive result was obtained on the submitted specimen  and confirmed on repeat testing.  While 2019 novel coronavirus  (SARS-CoV-2) nucleic acids may be present in the submitted sample  additional confirmatory testing may be necessary for epidemiological  and / or clinical management purposes  to differentiate between  SARS-CoV-2 and other Sarbecovirus currently known to infect humans.  If clinically indicated additional testing with an alternate test  methodology [phone]) is advised. The SARS-CoV-2 RNA is generally  detectable in upper and lower respiratory sp ecimens during the acute  phase of infection. The expected result is Negative. Fact Sheet for Patients:  StrictlyIdeas.no Fact Sheet for Healthcare Providers: BankingDealers.co.za This test is not yet approved or cleared by the [place] FDA and has been  authorized for  detection and/or diagnosis of SARS-CoV-2 by FDA under an Emergency Use Authorization (EUA).  This EUA will remain in effect (meaning this test can be used) for the duration of the COVID-19 declaration under Section 564(b)(1) of the Act, 21 U.S.C. section 360bbb-3(b)(1), unless the authorization is terminated or revoked sooner. Performed at [facility], [place] [address], [place], [place] [address]      Radiological Exams on Admission: Ct Soft Tissue Neck W Contrast  Result Date: 09/28/2019 CLINICAL DATA:  Dysphagia EXAM: CT NECK WITH CONTRAST TECHNIQUE: Multidetector CT imaging of the neck was performed using the standard protocol following the bolus administration of intravenous contrast. CONTRAST:  149mL OMNIPAQUE IOHEXOL 300 MG/ML  SOLN COMPARISON:  None. FINDINGS: PHARYNX AND LARYNX: --Nasopharynx: Fossae of Rosenmuller are clear. Normal adenoid tonsils for age. --Oral cavity and oropharynx: The palatine and lingual tonsils are normal. The visible oral cavity and floor of mouth are normal. --Hypopharynx: Normal vallecula and pyriform sinuses. --Larynx: Normal epiglottis and pre-epiglottic space. Normal aryepiglottic and vocal folds. --Retropharyngeal space: No abscess, effusion or lymphadenopathy. SALIVARY GLANDS: --Parotid: Both parotid glands are enlarged without focal abnormality. --Submandibular: Both are enlarged without focal abnormality. --Sublingual: Normal. No ranula or other visible lesion of the base of tongue and floor of mouth. THYROID: Normal. LYMPH NODES: No enlarged or abnormal density lymph nodes. VASCULAR: Major cervical vessels are patent. LIMITED INTRACRANIAL: Normal. VISUALIZED ORBITS: Normal. MASTOIDS AND VISUALIZED PARANASAL SINUSES: No fluid levels or advanced mucosal thickening. No mastoid effusion. SKELETON: No bony spinal canal stenosis. No lytic or blastic lesions. UPPER CHEST: Clear. OTHER: None. IMPRESSION: 1. No acute abnormality of the cervical soft tissues. 2.  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 Electronically Signed   By: Ulyses Jarred M.D.   On: 09/28/2019 21:35   Dg Chest Portable 1 View  Result Date: 09/29/2019 CLINICAL DATA:  Atrial fibrillation and difficulty swallowing for several hours EXAM: PORTABLE CHEST 1 VIEW COMPARISON:  09/26/2019 FINDINGS: Cardiac shadows within normal limits. Skin fold is noted over the right chest. The lungs are well aerated bilaterally. Very mild left basilar atelectasis is seen. No bony abnormality is noted. IMPRESSION: Mild left basilar atelectasis. Electronically Signed   By: Inez Catalina M.D.   On: 09/29/2019 00:35    EKG: Independently reviewed.  A. fib with RVR.  Assessment/Plan Principal Problem:   Dysphagia Active Problems:   Essential hypertension   OSA (obstructive sleep apnea)   MGUS (monoclonal gammopathy of unknown significance)   H/O Graves' disease   Atrial fibrillation with RVR ([place])    1. Dysphagia cause not clear.  Appears to be acute.  We will keep patient n.p.o. in anticipation of EGD.  Dr. Stefani Dama already has been consulted. 2. A. fib with RVR likely precipitated by not taking his rate limiting medications.  For now I kept patient on scheduled dose of metoprolol IV 5 mg every 6.  With holding orders. 3. History of Graves' disease status post radioactive iodine therapy.  TSH is within acceptable limits. 4. History of prediabetes.   5. History of MGUS per the chart. 6. History of sleep apnea.  COVID-19 test is negative.   DVT prophylaxis: SCDs in anticipation of EGD. Code Status: Full code. Family Communication: Discussed with patient. Disposition Plan: Home. Consults called: Gastroenterology Dr. Rush Landmark. Admission status: Observation.   Rise Patience MD [facility] Pager [phone].  If 7PM-7AM, please contact night-coverage [email or web address] Password [email or web address]  09/29/2019, 2:08 AM

## 2019-09-29 NOTE — ED Notes (Signed)
Pt noted to be tachycardic again, rate 160. Reports increased irritation to throat again; had some relief with carafate earlier tonight, requesting to try medication again

## 2019-09-29 NOTE — Op Note (Signed)
[facility] Patient Name: Gordon Phillips Procedure Date : 09/29/2019 MRN: [id number] Attending MD: Thornton Park MD, MD Date of Birth: Feb 17, 1946 CSN: [id number] Age: 73 Admit Type: Inpatient Procedure:                Upper GI endoscopy Indications:              Dysphagia Providers:                Thornton Park MD, MD, Jobe Igo, RN,                            Marguerita Merles, Technician Referring MD:              Medicines:                Monitored Anesthesia Care Complications:            No immediate complications. Estimated blood loss:                            Minimal. Estimated Blood Loss:     Estimated blood loss was minimal. Procedure:                Pre-Anesthesia Assessment:                           - Prior to the procedure, a History and Physical                            was performed, and patient medications and                            allergies were reviewed. The patient's tolerance of                            previous anesthesia was also reviewed. The risks                            and benefits of the procedure and the sedation                            options and risks were discussed with the patient.                            All questions were answered, and informed consent                            was obtained. Prior Anticoagulants: The patient has                            taken no previous anticoagulant or antiplatelet                            agents. ASA Grade Assessment: III - A patient with                            severe systemic  disease. After reviewing the risks                            and benefits, the patient was deemed in                            satisfactory condition to undergo the procedure.                           After obtaining informed consent, the endoscope was                            passed under direct vision. Throughout the                            procedure, the patient's blood  pressure, pulse, and                            oxygen saturations were monitored continuously. The                            [id number] Olympus gastroscope was                            introduced through the mouth, and advanced to the                            third part of duodenum. The upper GI endoscopy was                            accomplished without difficulty. The patient                            tolerated the procedure well. Scope In: Scope Out: Findings:      Multiple discrete ulcers were found at the epiglottis.      The examined esophagus was normal. No esophagitis.      A non-obstructing Schatzki ring was found in the lower third of the       esophagus.      Multiple dispersed large erosions with no bleeding and no stigmata of       recent bleeding were found in the gastric body and in the gastric       antrum. The associated erythema and congestion is extensive. Biopsies       were taken from the antrum, body, and fundus with a cold forceps for       histology. Estimated blood loss was minimal. There was no associated       pyloric channel outlet obstruction. A small haital hernia was seen.      Patchy moderately erythematous mucosa without active bleeding and with       no stigmata of bleeding was found in the duodenal bulb. Biopsies were       taken with a cold forceps for histology. Estimated blood loss was       minimal.      The cardia and gastric fundus were normal on retroflexion.  The exam was otherwise without abnormality. Impression:               - Ulceration was found at the epiglottis. The                            likely cause of his symptoms.                           - Normal esophagus.                           - Non-obstructing Schatzki ring.                           - A small hiatal hernia.                           - Erosive gastropathy with no bleeding and no                            stigmata of recent bleeding. Biopsied.                            - Erythematous duodenopathy. Biopsied.                           - The examination was otherwise normal. Recommendation:           - Return patient to hospital ward for ongoing care.                           - Advance diet as tolerated.                           - Continue present medications.                           - Pantoprazole 40 mg twice daily for 8 weeks.                           - Avoid all NSAIDs. Discuss alternatives to NSAIDs                            for management of his chronic back pain.                           - Await pathology results.                           - Consider ENT consultation given the location of                            his ulcers.                           - GI will move to stand-by. Please call the on-call  gastroenterologist with any additional questions or                            concerns during this hospitalization.                           I discussed these results and recommendations with                            the patient's wife, Gordon Phillips, by phone. All                            questions were answered to her satisfaction. Procedure Code(s):        --- Professional ---                           [id number], Esophagogastroduodenoscopy, flexible,                            transoral; with biopsy, single or multiple Diagnosis Code(s):        --- Professional ---                           J38.7, Other diseases of larynx                           K22.2, Esophageal obstruction                           K31.89, Other diseases of stomach and duodenum                           R13.10, Dysphagia, unspecified CPT copyright 2019 American Medical Association. All rights reserved. The codes documented in this report are preliminary and upon coder review may  be revised to meet current compliance requirements. Thornton Park MD, MD 09/29/2019 9:09:33 AM This report has been signed  electronically. Number of Addenda: 0

## 2019-09-29 NOTE — Transfer of Care (Signed)
Immediate Anesthesia Transfer of Care Note  Patient: Gordon Phillips  Procedure(s) Performed: ESOPHAGOGASTRODUODENOSCOPY (EGD) (N/A )  Patient Location: Endoscopy Unit  Anesthesia Type:MAC  Level of Consciousness: awake, alert , oriented and drowsy  Airway & Oxygen Therapy: Patient Spontanous Breathing and Patient connected to face mask oxygen  Post-op Assessment: Report given to RN, Post -op Vital signs reviewed and stable and Patient moving all extremities X 4  Post vital signs: Reviewed and stable  Last Vitals:  Vitals Value Taken Time  BP    Temp    Pulse    Resp    SpO2      Last Pain:  Vitals:   09/29/19 0802  TempSrc: Temporal  PainSc: 0-No pain         Complications: No apparent anesthesia complications

## 2019-09-30 ENCOUNTER — Other Ambulatory Visit: Payer: Self-pay | Admitting: Physician Assistant

## 2019-09-30 DIAGNOSIS — N400 Enlarged prostate without lower urinary tract symptoms: Secondary | ICD-10-CM | POA: Diagnosis present

## 2019-09-30 DIAGNOSIS — I4892 Unspecified atrial flutter: Secondary | ICD-10-CM | POA: Diagnosis not present

## 2019-09-30 DIAGNOSIS — K2980 Duodenitis without bleeding: Secondary | ICD-10-CM | POA: Diagnosis present

## 2019-09-30 DIAGNOSIS — G500 Trigeminal neuralgia: Secondary | ICD-10-CM | POA: Diagnosis present

## 2019-09-30 DIAGNOSIS — Z6835 Body mass index (BMI) 35.0-35.9, adult: Secondary | ICD-10-CM | POA: Diagnosis not present

## 2019-09-30 DIAGNOSIS — Z8639 Personal history of other endocrine, nutritional and metabolic disease: Secondary | ICD-10-CM

## 2019-09-30 DIAGNOSIS — R1310 Dysphagia, unspecified: Secondary | ICD-10-CM | POA: Diagnosis not present

## 2019-09-30 DIAGNOSIS — G4733 Obstructive sleep apnea (adult) (pediatric): Secondary | ICD-10-CM

## 2019-09-30 DIAGNOSIS — I4891 Unspecified atrial fibrillation: Secondary | ICD-10-CM | POA: Diagnosis not present

## 2019-09-30 DIAGNOSIS — K259 Gastric ulcer, unspecified as acute or chronic, without hemorrhage or perforation: Secondary | ICD-10-CM | POA: Diagnosis not present

## 2019-09-30 DIAGNOSIS — T39395A Adverse effect of other nonsteroidal anti-inflammatory drugs [NSAID], initial encounter: Secondary | ICD-10-CM | POA: Diagnosis present

## 2019-09-30 DIAGNOSIS — E785 Hyperlipidemia, unspecified: Secondary | ICD-10-CM | POA: Diagnosis present

## 2019-09-30 DIAGNOSIS — R1314 Dysphagia, pharyngoesophageal phase: Secondary | ICD-10-CM | POA: Diagnosis not present

## 2019-09-30 DIAGNOSIS — K3189 Other diseases of stomach and duodenum: Secondary | ICD-10-CM | POA: Diagnosis not present

## 2019-09-30 DIAGNOSIS — R519 Headache, unspecified: Secondary | ICD-10-CM | POA: Diagnosis not present

## 2019-09-30 DIAGNOSIS — Z20828 Contact with and (suspected) exposure to other viral communicable diseases: Secondary | ICD-10-CM | POA: Diagnosis not present

## 2019-09-30 DIAGNOSIS — E890 Postprocedural hypothyroidism: Secondary | ICD-10-CM | POA: Diagnosis present

## 2019-09-30 DIAGNOSIS — J0510 Acute epiglottitis without obstruction: Secondary | ICD-10-CM | POA: Diagnosis present

## 2019-09-30 DIAGNOSIS — J309 Allergic rhinitis, unspecified: Secondary | ICD-10-CM | POA: Diagnosis present

## 2019-09-30 DIAGNOSIS — K219 Gastro-esophageal reflux disease without esophagitis: Secondary | ICD-10-CM | POA: Diagnosis not present

## 2019-09-30 DIAGNOSIS — K449 Diaphragmatic hernia without obstruction or gangrene: Secondary | ICD-10-CM | POA: Diagnosis present

## 2019-09-30 DIAGNOSIS — E6609 Other obesity due to excess calories: Secondary | ICD-10-CM

## 2019-09-30 DIAGNOSIS — K111 Hypertrophy of salivary gland: Secondary | ICD-10-CM | POA: Diagnosis not present

## 2019-09-30 DIAGNOSIS — I482 Chronic atrial fibrillation, unspecified: Secondary | ICD-10-CM | POA: Diagnosis not present

## 2019-09-30 DIAGNOSIS — I10 Essential (primary) hypertension: Secondary | ICD-10-CM

## 2019-09-30 DIAGNOSIS — I480 Paroxysmal atrial fibrillation: Secondary | ICD-10-CM | POA: Diagnosis not present

## 2019-09-30 DIAGNOSIS — I440 Atrioventricular block, first degree: Secondary | ICD-10-CM | POA: Diagnosis present

## 2019-09-30 DIAGNOSIS — K2970 Gastritis, unspecified, without bleeding: Secondary | ICD-10-CM | POA: Diagnosis present

## 2019-09-30 DIAGNOSIS — E662 Morbid (severe) obesity with alveolar hypoventilation: Secondary | ICD-10-CM | POA: Diagnosis not present

## 2019-09-30 DIAGNOSIS — M19012 Primary osteoarthritis, left shoulder: Secondary | ICD-10-CM | POA: Diagnosis present

## 2019-09-30 DIAGNOSIS — D472 Monoclonal gammopathy: Secondary | ICD-10-CM | POA: Diagnosis not present

## 2019-09-30 DIAGNOSIS — J387 Other diseases of larynx: Secondary | ICD-10-CM | POA: Diagnosis not present

## 2019-09-30 DIAGNOSIS — K222 Esophageal obstruction: Secondary | ICD-10-CM | POA: Diagnosis not present

## 2019-09-30 LAB — GLUCOSE, CAPILLARY
Glucose-Capillary: 119 mg/dL — ABNORMAL HIGH (ref 70–99)
Glucose-Capillary: 81 mg/dL (ref 70–99)
Glucose-Capillary: 88 mg/dL (ref 70–99)
Glucose-Capillary: 90 mg/dL (ref 70–99)

## 2019-09-30 MED ORDER — SUCRALFATE 1 GM/10ML PO SUSP
1.0000 g | Freq: Three times a day (TID) | ORAL | Status: DC
  Administered 2019-09-30 – 2019-10-04 (×11): 1 g via ORAL
  Filled 2019-09-30 (×15): qty 10

## 2019-09-30 MED ORDER — METOPROLOL TARTRATE 5 MG/5ML IV SOLN
5.0000 mg | Freq: Four times a day (QID) | INTRAVENOUS | Status: DC
  Administered 2019-09-30 – 2019-10-02 (×8): 5 mg via INTRAVENOUS
  Filled 2019-09-30 (×8): qty 5

## 2019-09-30 MED ORDER — LIDOCAINE HCL 2 % EX GEL
1.0000 "application " | Freq: Once | CUTANEOUS | Status: DC | PRN
  Filled 2019-09-30: qty 4250

## 2019-09-30 MED ORDER — HYDRALAZINE HCL 20 MG/ML IJ SOLN: 10.0000 mg | Freq: Three times a day (TID) | INTRAMUSCULAR | Status: DC | PRN

## 2019-09-30 MED ORDER — DEXAMETHASONE SODIUM PHOSPHATE 10 MG/ML IJ SOLN
20.0000 mg | INTRAMUSCULAR | Status: DC
  Administered 2019-09-30: 20 mg via INTRAVENOUS
  Filled 2019-09-30: qty 2

## 2019-09-30 MED ORDER — LIDOCAINE-EPINEPHRINE (PF) 1 %-1:200000 IJ SOLN
0.0000 mL | Freq: Once | INTRAMUSCULAR | Status: DC | PRN
  Filled 2019-09-30: qty 30

## 2019-09-30 MED ORDER — METOPROLOL SUCCINATE ER 100 MG PO TB24
100.0000 mg | ORAL_TABLET | Freq: Every day | ORAL | Status: DC
  Filled 2019-09-30: qty 1

## 2019-09-30 MED ORDER — BACITRACIN-NEOMYCIN-POLYMYXIN OINTMENT TUBE
1.0000 "application " | TOPICAL_OINTMENT | Freq: Once | CUTANEOUS | Status: DC | PRN
  Filled 2019-09-30: qty 14

## 2019-09-30 MED ORDER — PHENOL 1.4 % MT LIQD
2.0000 | OROMUCOSAL | Status: DC | PRN
  Administered 2019-10-01: 2 via OROMUCOSAL
  Filled 2019-09-30: qty 177

## 2019-09-30 MED ORDER — OXYMETAZOLINE HCL 0.05 % NA SOLN
1.0000 | Freq: Once | NASAL | Status: DC | PRN
  Filled 2019-09-30: qty 30

## 2019-09-30 MED ORDER — SILVER NITRATE-POT NITRATE 75-25 % EX MISC
1.0000 | Freq: Once | CUTANEOUS | Status: DC | PRN
  Filled 2019-09-30: qty 1

## 2019-09-30 MED ORDER — LIDOCAINE VISCOUS HCL 2 % MT SOLN: 15.0000 mL | OROMUCOSAL | Status: DC | PRN

## 2019-09-30 MED ORDER — LIDOCAINE HCL 4 % EX SOLN
0.0000 mL | Freq: Once | CUTANEOUS | Status: DC | PRN
  Filled 2019-09-30: qty 50

## 2019-09-30 MED ORDER — SODIUM CHLORIDE 0.9 % IV SOLN
2.0000 g | INTRAVENOUS | Status: DC
  Administered 2019-09-30 – 2019-10-02 (×3): 2 g via INTRAVENOUS
  Filled 2019-09-30 (×3): qty 20

## 2019-09-30 MED ORDER — PANTOPRAZOLE SODIUM 40 MG IV SOLR
40.0000 mg | Freq: Two times a day (BID) | INTRAVENOUS | Status: DC
  Administered 2019-09-30 – 2019-10-04 (×9): 40 mg via INTRAVENOUS
  Filled 2019-09-30 (×9): qty 40

## 2019-09-30 MED ORDER — DILTIAZEM HCL ER COATED BEADS 120 MG PO CP24
120.0000 mg | ORAL_CAPSULE | Freq: Every day | ORAL | Status: DC
  Filled 2019-09-30: qty 1

## 2019-09-30 MED ORDER — MORPHINE SULFATE (PF) 2 MG/ML IV SOLN
2.0000 mg | Freq: Once | INTRAVENOUS | Status: AC
  Administered 2019-09-30: 2 mg via INTRAVENOUS
  Filled 2019-09-30: qty 1

## 2019-09-30 NOTE — Consult Note (Signed)
Gordon Phillips, Gordon Phillips 73 y.o., male [id number]     Chief Complaint: Throat pain  HPI: 73 year old black male had relatively sudden onset severe throat pain beginning 6 days ago.  It has been sufficiently severe that he is basically not able to swallow essentially anything including liquids.  No breathing difficulty.  No change in voice.  No fever.  No trauma to the neck or throat.  No history of foreign body ingestion.  No reflux.  He was seen in the emergency room and felt to have a viral syndrome and sent home.  2 days later he came back in worsening.  He was admitted to the hospital.  He had upper GI endoscopy yesterday showing ulcerations of the supraglottis, and also multiple ulcerations in the body of the stomach.  He has not seen any blood.  He is on steroids and antibiotics.  The pain is slightly better.  He uses some over-the-counter pulmonary inhalers but nothing with steroids.  He has not had any recent courses of steroids or antibiotics.  He does not recall ever having had thrush previously.  He has extensive vitiligo but no other known autoimmune processes. PMH: Past Medical History:  Diagnosis Date  . ALLERGIC RHINITIS 01/25/2010  . Arthritis    "neck, back, shoulders," (02/20/2017)  . Chronic lower back pain   . High cholesterol   . Hx of cardiovascular stress test    a. Lexiscan Myoview 02/06/13:  EF 59%, no ischemia or scar  . Hx of echocardiogram    a.  Echo 02/03/13:  Mild LVH, mild focal basal septal hypertrophy, EF 50-55%, Gr 1 DD, mild LAE  . HYPERTENSION 01/25/2010  . HYPERTHYROIDISM 01/25/2010   S/P radioactive iodine "back in the 1980s" (02/20/2017)  . Impaired glucose tolerance   . 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 Obesity   . OSA on CPAP    Uses CPAP nightly  . Pericarditis 1980's  . 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  Marland Kitchen Vitiligo     Surg Hx: Past Surgical History:  Procedure Laterality Date  .  A-FLUTTER ABLATION N/A 04/23/2019   Procedure: A-FLUTTER ABLATION;  Surgeon: Deboraha Sprang, MD;  Location: [place] [facility] LAB;  Service: Cardiovascular;  Laterality: N/A;  . BIOPSY  09/29/2019   Procedure: BIOPSY;  Surgeon: Thornton Park, MD;  Location: [place];  Service: Gastroenterology;;  . CARDIOVERSION N/A 08/14/2018   Procedure: CARDIOVERSION;  Surgeon: [name], MD;  Location: [facility] ENDOSCOPY;  Service: Cardiovascular;  Laterality: N/A;  . ESOPHAGOGASTRODUODENOSCOPY N/A 09/29/2019   Procedure: ESOPHAGOGASTRODUODENOSCOPY (EGD);  Surgeon: Thornton Park, MD;  Location: [place];  Service: Gastroenterology;  Laterality: N/A;  . INGUINAL HERNIA REPAIR Bilateral   . LUMBAR LAMINECTOMY    . TEE WITHOUT CARDIOVERSION N/A 08/14/2018   Procedure: TRANSESOPHAGEAL ECHOCARDIOGRAM (TEE);  Surgeon: [name], MD;  Location: [facility] ENDOSCOPY;  Service: Cardiovascular;  Laterality: N/A;  . UMBILICAL HERNIA REPAIR      FHx:   Family History  Problem Relation Age of Onset  . Stroke Father        Passed away in his 16s  . Thyroid disease Sister        goiter  . Sudden death Brother        ? Drug use, no autopsy  . Throat cancer Brother   . Colon cancer Neg Hx   . Esophageal cancer Neg Hx   .  Rectal cancer Neg Hx   . Stomach cancer Neg Hx    SocHx:  reports that he quit smoking about 52 years ago. His smoking use included cigarettes. He has a 4.00 pack-year smoking history. He has never used smokeless tobacco. He reports current alcohol use. He reports that he does not use drugs.  ALLERGIES:  Allergies  Allergen Reactions  . Flomax [Tamsulosin Hcl] Itching  . Relpax [Eletriptan] Other (See Comments) and Hypertension    Chest pain and sweating  . Other Palpitations    Any steroids cause heart palpitations  (tolerates low doses)    Medications Prior to Admission  Medication Sig Dispense Refill  . acetaminophen-codeine (TYLENOL #3) 300-30 MG tablet Take 1 tablet by  mouth every 6 (six) hours as needed for moderate pain. 10 tablet 0  . Aloe-Sodium Chloride (AYR SALINE NASAL GEL NA) Place 1 application into the nose at bedtime.    . Aromatic Inhalants (VICKS VAPOINHALER) INHA Place 1 puff into both nostrils as needed (for congestion or stuffiness).    . Aspirin-Caffeine (BAYER BACK & BODY) 500-32.5 MG TABS Take 2 tablets by mouth every morning.    Skipper Cliche SALINE NASAL DROPS NA Place 2 sprays into both nostrils at bedtime.     Marland Kitchen diltiazem (CARDIZEM CD) 120 MG 24 hr capsule Take 1 capsule (120 mg total) by mouth daily. (Patient taking differently: Take 120 mg by mouth at bedtime. ) 90 capsule 3  . doxazosin (CARDURA) 2 MG tablet Take 1 tablet (2 mg total) by mouth at bedtime. 90 tablet 3  . hydrocortisone cream 1 % Apply 1 application topically 2 (two) times daily as needed for itching.    . metoprolol succinate (TOPROL-XL) 100 MG 24 hr tablet Take 1 tablet (100 mg total) by mouth at bedtime. Take with or immediately following a meal. 90 tablet 3  . olmesartan (BENICAR) 40 MG tablet TAKE 1 TABLET BY MOUTH EVERY DAY (Patient taking differently: Take 40 mg by mouth at bedtime. ) 90 tablet 1  . OVER THE COUNTER MEDICATION Take 1 tablet by mouth 2 (two) times daily. OTC - Super Beta Prostate    . pravastatin (PRAVACHOL) 40 MG tablet Take 1 tablet (40 mg total) by mouth at bedtime. 90 tablet 3  . PRESCRIPTION MEDICATION Inhale into the lungs at bedtime. CPAP    . sildenafil (VIAGRA) 100 MG tablet Take 100 mg by mouth daily as needed for erectile dysfunction.     . vitamin C (ASCORBIC ACID) 500 MG tablet Take 1,000 mg by mouth every morning.    . cyclobenzaprine (FLEXERIL) 5 MG tablet Take 1 tablet (5 mg total) by mouth 3 (three) times daily as needed. (Patient not taking: Reported on 09/28/2019) 10 tablet 0  . glucose blood test strip Use to check blood sugar once a day 100 each 12    Results for orders placed or performed during the hospital encounter of 09/28/19 (from  the past 48 hour(s))  CBC with Differential     Status: None   Collection Time: 09/28/19 11:33 PM  Result Value Ref Range   WBC 9.8 4.0 - 10.5 K/uL   RBC 4.52 4.22 - 5.81 MIL/uL   Hemoglobin 14.0 13.0 - 17.0 g/dL   HCT 41.8 39.0 - 52.0 %   MCV 92.5 80.0 - 100.0 fL   MCH 31.0 26.0 - 34.0 pg   MCHC 33.5 30.0 - 36.0 g/dL   RDW 13.4 11.5 - 15.5 %   Platelets 151 150 -  400 K/uL   nRBC 0.0 0.0 - 0.2 %   Neutrophils Relative % 55 %   Neutro Abs 5.4 1.7 - 7.7 K/uL   Lymphocytes Relative 34 %   Lymphs Abs 3.3 0.7 - 4.0 K/uL   Monocytes Relative 9 %   Monocytes Absolute 0.9 0.1 - 1.0 K/uL   Eosinophils Relative 2 %   Eosinophils Absolute 0.2 0.0 - 0.5 K/uL   Basophils Relative 0 %   Basophils Absolute 0.0 0.0 - 0.1 K/uL   Immature Granulocytes 0 %   Abs Immature Granulocytes 0.03 0.00 - 0.07 K/uL    Comment: Performed at [facility] [address], [place], [place] [id number]  Basic metabolic panel     Status: None   Collection Time: 09/28/19 11:33 PM  Result Value Ref Range   Sodium 140 135 - 145 mmol/L   Potassium 3.6 3.5 - 5.1 mmol/L   Chloride 107 98 - 111 mmol/L   CO2 22 22 - 32 mmol/L   Glucose, Bld 87 70 - 99 mg/dL   BUN 23 8 - 23 mg/dL   Creatinine, Ser 1.15 0.61 - 1.24 mg/dL   Calcium 8.9 8.9 - 10.3 mg/dL   GFR calc non Af Amer >60 >60 mL/min   GFR calc Af Amer >60 >60 mL/min   Anion gap 11 5 - 15    Comment: Performed at [facility] [address], [place], [place] [address]  POC CBG, ED     Status: None   Collection Time: 09/28/19 11:35 PM  Result Value Ref Range   Glucose-Capillary 82 70 - 99 mg/dL  SARS CORONAVIRUS 2 (TAT 6-24 HRS) Nasopharyngeal Nasopharyngeal Swab     Status: None   Collection Time: 09/28/19 11:53 PM   Specimen: Nasopharyngeal Swab  Result Value Ref Range   SARS Coronavirus 2 NEGATIVE NEGATIVE    Comment: (NOTE) SARS-CoV-2 target nucleic acids are NOT DETECTED. The SARS-CoV-2 RNA is generally detectable in upper and  lower respiratory specimens during the acute phase of infection. Negative results do not preclude SARS-CoV-2 infection, do not rule out co-infections with other pathogens, and should not be used as the sole basis for treatment or other patient management decisions. Negative results must be combined with clinical observations, patient history, and epidemiological information. The expected result is Negative. Fact Sheet for Patients: SugarRoll.be Fact Sheet for Healthcare Providers: https://www.woods-mathews.com/ This test is not yet approved or cleared by the [place] FDA and  has been authorized for detection and/or diagnosis of SARS-CoV-2 by FDA under an Emergency Use Authorization (EUA). This EUA will remain  in effect (meaning this test can be used) for the duration of the COVID-19 declaration under Section 56 4(b)(1) of the Act, 21 U.S.C. section 360bbb-3(b)(1), unless the authorization is terminated or revoked sooner. Performed at [facility], [place] [address], [place], [place] [address]   SARS Coronavirus 2 by RT PCR (hospital order, performed in [facility] hospital lab) Nasopharyngeal Nasopharyngeal Swab     Status: None   Collection Time: 09/28/19 11:53 PM   Specimen: Nasopharyngeal Swab  Result Value Ref Range   SARS Coronavirus 2 NEGATIVE NEGATIVE    Comment: (NOTE) If result is NEGATIVE SARS-CoV-2 target nucleic acids are NOT DETECTED. The SARS-CoV-2 RNA is generally detectable in upper and lower  respiratory specimens during the acute phase of infection. The lowest  concentration of SARS-CoV-2 viral copies this assay can detect is 250  copies / mL. A negative  result does not preclude SARS-CoV-2 infection  and should not be used as the sole basis for treatment or other  patient management decisions.  A negative result may occur with  improper specimen collection / handling, submission of specimen other  than  nasopharyngeal swab, presence of viral mutation(s) within the  areas targeted by this assay, and inadequate number of viral copies  (<250 copies / mL). A negative result must be combined with clinical  observations, patient history, and epidemiological information. If result is POSITIVE SARS-CoV-2 target nucleic acids are DETECTED. The SARS-CoV-2 RNA is generally detectable in upper and lower  respiratory specimens dur ing the acute phase of infection.  Positive  results are indicative of active infection with SARS-CoV-2.  Clinical  correlation with patient history and other diagnostic information is  necessary to determine patient infection status.  Positive results do  not rule out bacterial infection or co-infection with other viruses. If result is PRESUMPTIVE POSTIVE SARS-CoV-2 nucleic acids MAY BE PRESENT.   A presumptive positive result was obtained on the submitted specimen  and confirmed on repeat testing.  While 2019 novel coronavirus  (SARS-CoV-2) nucleic acids may be present in the submitted sample  additional confirmatory testing may be necessary for epidemiological  and / or clinical management purposes  to differentiate between  SARS-CoV-2 and other Sarbecovirus currently known to infect humans.  If clinically indicated additional testing with an alternate test  methodology [phone]) is advised. The SARS-CoV-2 RNA is generally  detectable in upper and lower respiratory sp ecimens during the acute  phase of infection. The expected result is Negative. Fact Sheet for Patients:  StrictlyIdeas.no Fact Sheet for Healthcare Providers: BankingDealers.co.za This test is not yet approved or cleared by the [place] FDA and has been authorized for detection and/or diagnosis of SARS-CoV-2 by FDA under an Emergency Use Authorization (EUA).  This EUA will remain in effect (meaning this test can be used) for the duration of  the COVID-19 declaration under Section 564(b)(1) of the Act, 21 U.S.C. section 360bbb-3(b)(1), unless the authorization is terminated or revoked sooner. Performed at [facility], [place] [address] [place], [place] [id number]   Basic metabolic panel     Status: Abnormal   Collection Time: 09/29/19  3:34 AM  Result Value Ref Range   Sodium 141 135 - 145 mmol/L   Potassium 3.5 3.5 - 5.1 mmol/L   Chloride 112 (H) 98 - 111 mmol/L   CO2 20 (L) 22 - 32 mmol/L   Glucose, Bld 82 70 - 99 mg/dL   BUN 22 8 - 23 mg/dL   Creatinine, Ser 1.19 0.61 - 1.24 mg/dL   Calcium 8.3 (L) 8.9 - 10.3 mg/dL   GFR calc non Af Amer >60 >60 mL/min   GFR calc Af Amer >60 >60 mL/min   Anion gap 9 5 - 15    Comment: Performed at [facility], [place] [address] [place] [address]  CBC     Status: Abnormal   Collection Time: 09/29/19  3:34 AM  Result Value Ref Range   WBC 8.7 4.0 - 10.5 K/uL   RBC 4.30 4.22 - 5.81 MIL/uL   Hemoglobin 13.3 13.0 - 17.0 g/dL   HCT 40.5 39.0 - 52.0 %   MCV 94.2 80.0 - 100.0 fL   MCH 30.9 26.0 - 34.0 pg   MCHC 32.8 30.0 - 36.0 g/dL   RDW 13.6 11.5 - 15.5 %   Platelets 133 (L) 150 - 400  K/uL    Comment: REPEATED TO VERIFY   nRBC 0.0 0.0 - 0.2 %    Comment: Performed at [facility], [place] [address], [place], [place] [address]  TSH     Status: None   Collection Time: 09/29/19  3:34 AM  Result Value Ref Range   TSH 0.773 0.350 - 4.500 uIU/mL    Comment: Performed by a 3rd Generation assay with a functional sensitivity of <=0.01 uIU/mL. Performed at [facility], [place] [address] [place], [place] [address]   Troponin I (High Sensitivity)     Status: None   Collection Time: 09/29/19  3:34 AM  Result Value Ref Range   Troponin I (High Sensitivity) 9 <18 ng/L    Comment: (NOTE) Elevated high sensitivity troponin I (hsTnI) values and significant  changes across serial measurements may suggest ACS but many other  chronic and acute conditions are known to  elevate hsTnI results.  Refer to the "Links" section for chest pain algorithms and additional  guidance. Performed at [facility], [place] [address] [place], [place] [address]   CBG monitoring, ED     Status: None   Collection Time: 09/29/19  3:41 AM  Result Value Ref Range   Glucose-Capillary 72 70 - 99 mg/dL  CBG monitoring, ED     Status: None   Collection Time: 09/29/19  6:06 AM  Result Value Ref Range   Glucose-Capillary 90 70 - 99 mg/dL  CBG monitoring, ED     Status: None   Collection Time: 09/29/19 11:50 AM  Result Value Ref Range   Glucose-Capillary 70 70 - 99 mg/dL   Comment 1 Notify RN    Comment 2 Document in Chart   Troponin I (High Sensitivity)     Status: None   Collection Time: 09/29/19  4:35 PM  Result Value Ref Range   Troponin I (High Sensitivity) 10 <18 ng/L    Comment: (NOTE) Elevated high sensitivity troponin I (hsTnI) values and significant  changes across serial measurements may suggest ACS but many other  chronic and acute conditions are known to elevate hsTnI results.  Refer to the "Links" section for chest pain algorithms and additional  guidance. Performed at [facility], [place] [address] [place], [place] [address]   Glucose, capillary     Status: None   Collection Time: 09/29/19  6:28 PM  Result Value Ref Range   Glucose-Capillary 86 70 - 99 mg/dL  Glucose, capillary     Status: None   Collection Time: 09/30/19 12:21 AM  Result Value Ref Range   Glucose-Capillary 88 70 - 99 mg/dL   Comment 1 Document in Chart   Glucose, capillary     Status: None   Collection Time: 09/30/19  8:44 AM  Result Value Ref Range   Glucose-Capillary 90 70 - 99 mg/dL  Glucose, capillary     Status: None   Collection Time: 09/30/19 11:57 AM  Result Value Ref Range   Glucose-Capillary 81 70 - 99 mg/dL  Glucose, capillary     Status: Abnormal   Collection Time: 09/30/19  5:28 PM  Result Value Ref Range   Glucose-Capillary 119 (H) 70 - 99 mg/dL   Ct  Soft Tissue Neck W Contrast  Result Date: 09/28/2019 CLINICAL DATA:  Dysphagia EXAM: CT NECK WITH CONTRAST TECHNIQUE: Multidetector CT imaging of the neck was performed using the standard protocol following the bolus administration of intravenous contrast. CONTRAST:  173mL OMNIPAQUE IOHEXOL 300  MG/ML  SOLN COMPARISON:  None. FINDINGS: PHARYNX AND LARYNX: --Nasopharynx: Fossae of Rosenmuller are clear. Normal adenoid tonsils for age. --Oral cavity and oropharynx: The palatine and lingual tonsils are normal. The visible oral cavity and floor of mouth are normal. --Hypopharynx: Normal vallecula and pyriform sinuses. --Larynx: Normal epiglottis and pre-epiglottic space. Normal aryepiglottic and vocal folds. --Retropharyngeal space: No abscess, effusion or lymphadenopathy. SALIVARY GLANDS: --Parotid: Both parotid glands are enlarged without focal abnormality. --Submandibular: Both are enlarged without focal abnormality. --Sublingual: Normal. No ranula or other visible lesion of the base of tongue and floor of mouth. THYROID: Normal. LYMPH NODES: No enlarged or abnormal density lymph nodes. VASCULAR: Major cervical vessels are patent. LIMITED INTRACRANIAL: Normal. VISUALIZED ORBITS: Normal. MASTOIDS AND VISUALIZED PARANASAL SINUSES: No fluid levels or advanced mucosal thickening. No mastoid effusion. SKELETON: No bony spinal canal stenosis. No lytic or blastic lesions. UPPER CHEST: Clear. OTHER: None. IMPRESSION: 1. No acute abnormality of the cervical soft tissues. 2. 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 Electronically Signed   By: Ulyses Jarred M.D.   On: 09/28/2019 21:35   Dg Chest Portable 1 View  Result Date: 09/29/2019 CLINICAL DATA:  Atrial fibrillation and difficulty swallowing for several hours EXAM: PORTABLE CHEST 1 VIEW COMPARISON:  09/26/2019 FINDINGS: Cardiac shadows within normal limits. Skin fold is noted over the right  chest. The lungs are well aerated bilaterally. Very mild left basilar atelectasis is seen. No bony abnormality is noted. IMPRESSION: Mild left basilar atelectasis. Electronically Signed   By: Inez Catalina M.D.   On: 09/29/2019 00:35     Blood pressure (!) 152/98, pulse (!) 101, temperature 98.3 F (36.8 C), temperature source Oral, resp. rate 20, height 6\' 2"  (1.88 m), weight 124.9 kg, SpO2 98 %.  PHYSICAL EXAM: Overall appearance: He is stocky and somewhat overweight.  He is energetic.  Mental status is sharp.  He hears well in conversational speech. Head: Atraumatic Ears: Clear canals with normal aerated drums Nose: Anterior nose is moist and widely patent. Oral Cavity: Oral cavity is clear with teeth in good repair. Oral Pharynx/Hypopharynx/Larynx: Oropharynx is clear without erythema or ulceration.  Using 1% viscous Xylocaine to anesthetize the anterior nose, the flexible laryngoscope was used to inspect the pharynx.  There are some superficial ulcerations on the laryngeal surface of the epiglottis which is not swollen.  Vocal cords are fully mobile without obvious ulceration.  Airway is good.  No pooling in valleculae or piriforms.  Neuro: Cranial nerves grossly intact. Neck: Without adenopathy.  No tenderness.  Studies Reviewed: Upper GI endoscopy report reviewed.    Assessment/Plan Supraglottic ulceration of uncertain etiology.  Most likely viral. Appearance this could be thrush but without obvious risk factors.  Plan: I would do an autoimmune serologic evaluation for lupus, rheumatoid, Sjogren's, Behcet's syndrome, etc.  I have given him some Viscous Xylocaine and Chloraseptic spray to help with discomfort.  I think he can advance diet as comfortable.  I will see him back in my office in 2-3 weeks.  Ileene Hutchinson [facility] [id number], [id number] PM

## 2019-09-30 NOTE — TOC Initial Note (Addendum)
Transition of Care [facility]) - Initial/Assessment Note    Patient Details  Name: Gordon Phillips MRN: [id number] Date of Birth: 10/03/1946  Transition of [facility]) CM/SW Contact:    Sharin Mons, RN Phone Number: 09/30/2019, 11:10 AM  Clinical Narrative:       Presents with difficulty swallowing / acute epiglottitis . Hx of atrial flutter, s/p recent ablation, anticoagulation, prediabetes, Graves disease. From home with with wife. States independent with ADL's ,  no DME usage.       Jakyrie Simbeck (Spouse)      [phone]       ENT consult pending.  No present needs identified .... TOC team will continue to monitor.  Expected Discharge Plan: Home/Self Care Barriers to Discharge: Continued Medical Work up   Patient Goals and CMS Choice Patient states their goals for this hospitalization and ongoing recovery are:: to get better      Expected Discharge Plan and Services Expected Discharge Plan: Home/Self Care   Discharge Planning Services: CM Consult   Living arrangements for the past 2 months: Single Family Home Expected Discharge Date: 09/30/19                Prior Living Arrangements/Services Living arrangements for the past 2 months: Single Family Home Lives with:: Significant Other Patient language and need for interpreter reviewed:: Yes        Need for Family Participation in Patient Care: No (Comment) Care giver support system in place?: Yes (comment)   Criminal Activity/Legal Involvement Pertinent to Current Situation/Hospitalization: No - Comment as needed  Activities of Daily Living      Permission Sought/Granted   Permission granted to share information with : Yes, Release of Information Signed  Share Information with NAME: Diane Donelan(Spouse)[phone]           Emotional Assessment Appearance:: Appears stated age Attitude/Demeanor/Rapport: Engaged Affect (typically observed): Accepting Orientation: : Oriented to Self, Oriented to  Place, Oriented to  Time, Oriented to Situation Alcohol / Substance Use: Not Applicable Psych Involvement: No (comment)  Admission diagnosis:  Odynophagia [R13.10] Dysphagia [R13.10] Atrial fibrillation, unspecified type ([place]) [I48.91] Dysphagia, unspecified type [R13.10] Patient Active Problem List   Diagnosis Date Noted  . Dysphagia 09/29/2019  . Atrial fibrillation with RVR ([place]) 09/29/2019  . Prediabetes 09/19/2019  . H/O Graves' disease 09/19/2019  . Deviated septum 08/20/2018  . BPH (benign prostatic hyperplasia) 08/20/2018  . Anal irritation 08/20/2018  . Atrial flutter ([place]) 08/12/2018  . HLD (hyperlipidemia) 02/20/2017  . History of CVA (cerebrovascular accident) 02/20/2017  . Back pain, chronic 11/22/2015  . MGUS (monoclonal gammopathy of unknown significance) 01/21/2015  . OSA (obstructive sleep apnea) 03/16/2014  . Essential hypertension 01/25/2010  . Allergic rhinitis 01/25/2010   PCP:  Vivi Barrack, MD Pharmacy:   [id number] - [place], [place] [address] Phone: [phone] Fax: [phone]     Social Determinants of Health (SDOH) Interventions    Readmission Risk Interventions No flowsheet data found.

## 2019-09-30 NOTE — Anesthesia Postprocedure Evaluation (Signed)
Anesthesia Post Note  Patient: Gordon Phillips  Procedure(s) Performed: ESOPHAGOGASTRODUODENOSCOPY (EGD) (N/A ) BIOPSY     Patient location during evaluation: PACU Anesthesia Type: MAC Level of consciousness: awake and alert Pain management: pain level controlled Vital Signs Assessment: post-procedure vital signs reviewed and stable Respiratory status: spontaneous breathing Cardiovascular status: stable Anesthetic complications: no    Last Vitals:  Vitals:   09/30/19 0803 09/30/19 0824  BP: (!) 144/101   Pulse: (!) 115 (!) 105  Resp: (!) 24 20  Temp: 37.3 C   SpO2: 98% 96%    Last Pain:  Vitals:   09/30/19 0803  TempSrc: Oral  PainSc:                  Nolon Nations

## 2019-09-30 NOTE — Progress Notes (Signed)
SLP Cancellation Note  Patient Details Name: Gordon Phillips MRN: [id number] DOB: [birth year]   Cancelled treatment:        SLP orders received and appreciated.  Clinical swallow evaluation was not completed today as pt declined PO trials, but SLP did discuss pt's situation with him.  Unfortunately, ST does not have much to offer to address epiglottic ulcers found by GI which are suspected to be source of symptoms.  No ulcerations or redness noted in oral cavity or at potion of oropharynx visible during assessment of oral cavity.  Pt would benefit from ENT consult to determine source of difficulties and create treatment plan.  Pt states that this is why he has presented to hospital and has been looking for this consult for several days now.  Pt is also concerned about his nutrition and medication regimen as he has not been able to swallow for 4 or 5 days. Pt expressed concern that not receiving his medication could result in a more serious medical crisis.  Discussed alternate means of nutrition with pt.  He would consider NGT or TPN.  He is not interested in a PEG.  Consider RD evaluation to determine if supplemental nutrition is appropriate. Pt coughing intermittently throughout conversation.  Endorses difficulty managing secretions and is using suction independently.  CXR revealed possible atalectasis, and RN reports clear lung sounds, so he appears to be protecting his airway at present.  Although ST is not indicated at this time, SLP will follow/monitor to determine if pt has any needs during his stay.  Recommendations:   ENT consult;  RD consult;  Continue liquid diet as tolerated   Celedonio Savage, [place], [place] Office: [phone] 09/30/2019, 11:58 AM

## 2019-09-30 NOTE — Progress Notes (Signed)
PROGRESS NOTE    Gordon Phillips  [id number] DOB: 1946-10-20 DOA: 09/28/2019 PCP: Vivi Barrack, MD     Brief Narrative:  73 y.o. male with history of atrial flutter status post recent ablation on Cardizem metoprolol off anticoagulation, prediabetes, Graves' disease status post radioactive iodine therapy has been experiencing difficulty swallowing over the last 4 to 5 days.  Has not had any tongue swelling or any chest pain fever chills or shortness of breath has not had any new medications.  Patient states when he tries to swallow something it feels like some pins-and-needles are in the throat and is not able to swallow anything at all with intense pain.  Had come to the ER 2 days ago was treated for possible pharyngitis.  Patient went home and came back because of severe pain.  Patient was not able to take his medication.  ED Course: In the ER patient had a CT soft neck tissue which did not show anything acute.  While in the ER patient went into A. fib with RVR.  Has been given 1 dose of Cardizem bolus following which heart rate improved but still tachycardic.  On-call gastroenterologist Dr. Rush Landmark has been consulted planning is for EGD in the morning.  We will keep patient n.p.o. and keep patient on scheduled dose of IV metoprolol as tolerated.  Labs show COVID-19 test is negative.  Hemoglobin 13 creatinine 1.1 high-sensitivity troponin is 9 TSH 0.77.   Assessment & Plan: 1-acute epiglottitis -still with severe Dysphagia, inability to clear secretions and with ongoing drooling and pain. -not tolerating PO's -will start IV rocephin and IV steroids -ENT consulted (Dr. Redmond Baseman). -follow clinical response  2-gastritis/non bleeding ulcer -follow endoscopy biopsy results -continue IV PPI for now, with intentions to transition to PO PPI BID for 8 weeks; daily after that.  -avoid NSAIDs  3-Essential hypertension -elevated currently in the setting of inability taking home meds -will  start schedule IV lopressor -PRN hydralazine also ordered.  4-class 2 obesity with OSA (obstructive sleep apnea) -low calorie diet, protion control and increase physical activity recommended. -Body mass index is 35.35 kg/m. -no using CPAP currently  5-MGUS (monoclonal gammopathy of unknown significance)  -stable -continue outpatient follow up  6-H/O Graves' disease -not requiring thyroid replacement  7-chronic Atrial fibrillation -not using anticoagulaiton -CHADsVASC score 1 -continue b-blocker IV for now  8-HLD -continue statins at discharge.  DVT prophylaxis: SCD's Code Status: Full code. Family Communication: no family at bedside. Disposition Plan: remains inpatient, start IV rocephin and solumedrol, continue IV PPI and carafate. IV meds for BP and rate control, follow clinical response.   Consultants:   ENT  GI service.  Procedures:   EGD: demonstrating moderate to severe epiglottitis with ulcers, also with gastritis and non-bleeding ulcers in the antrum.   Antimicrobials:  Anti-infectives (From admission, onward)   None      Subjective: Afebrile, no CP, no palpitations. Having difficulty with swallowing, controlling/clearing secretions and complaining of severe throat pain with extensive drooling.   Objective: Vitals:   09/30/19 0802 09/30/19 0803 09/30/19 0824 09/30/19 1158  BP: (!) 147/100 (!) 144/101  (!) 158/97  Pulse:  (!) 115 (!) 105 85  Resp: 19 (!) 24 20 (!) 22  Temp:  99.1 F (37.3 C)  100.2 F (37.9 C)  TempSrc:  Oral  Oral  SpO2: 94% 98% 96% 98%  Weight:      Height:        Intake/Output Summary (Last 24 hours) at 09/30/2019 1331  Last data filed at 09/30/2019 0845 Gross per 24 hour  Intake 1355 ml  Output 500 ml  Net 855 ml   Filed Weights   09/28/19 1644 09/29/19 0802 09/30/19 0440  Weight: 127 kg 124.7 kg 124.9 kg    Examination: General exam: Alert, awake, oriented x 3; patient complaining of severe throat pain, increase  drooling and having trouble handling secretions. Unable to take PO meds and struggling with clear liquid diet tolerance.  Respiratory system: Clear to auscultation. Respiratory effort normal. Cardiovascular system: irregular, no rubs, no gallops, no murmur, no JVD. Gastrointestinal system: Abdomen is obese, nondistended, soft and nontender. No organomegaly or masses felt. Normal bowel sounds heard. Central nervous system: Alert and oriented. No focal neurological deficits. Extremities: No cyanosis or clubbing. Skin: No rashes, no petechiae, positive vitiligo changes.  Psychiatry: Judgement and insight appear normal. Mood & affect appropriate.   Data Reviewed: I have personally reviewed following labs and imaging studies  CBC: Recent Labs  Lab 09/26/19 0717 09/28/19 2333 09/29/19 0334  WBC 7.5 9.8 8.7  NEUTROABS  --  5.4  --   HGB 13.7 14.0 13.3  HCT 40.6 41.8 40.5  MCV 90.8 92.5 94.2  PLT 138* 151 [id number]*   Basic Metabolic Panel: Recent Labs  Lab 09/26/19 0717 09/28/19 2333 09/29/19 0334  NA 136 140 141  K 3.7 3.6 3.5  CL 102 107 112*  CO2 23 22 20*  GLUCOSE 113* 87 82  BUN 13 23 22   CREATININE 1.10 1.15 1.19  CALCIUM 9.2 8.9 8.3*   GFR: Estimated Creatinine Clearance: 78.8 mL/min (by C-G formula based on SCr of 1.19 mg/dL).  CBG: Recent Labs  Lab 09/29/19 1150 09/29/19 1828 09/30/19 0021 09/30/19 0844 09/30/19 1157  GLUCAP 70 86 88 90 81   Thyroid Function Tests: Recent Labs    09/29/19 0334  TSH 0.773    Recent Results (from the past 240 hour(s))  Group A Strep by PCR     Status: None   Collection Time: 09/26/19  8:30 AM   Specimen: Throat; Sterile Swab  Result Value Ref Range Status   Group A Strep by PCR NOT DETECTED NOT DETECTED Final    Comment: Performed at [facility], [place]. [address] [place], [place] [address]  Novel Coronavirus, NAA (Hosp order, Send-out to Ref Lab; TAT 18-24 hrs     Status: None   Collection Time: 09/26/19  8:30 AM    Specimen: Throat; Respiratory  Result Value Ref Range Status   SARS-CoV-2, NAA NOT DETECTED NOT DETECTED Final    Comment: (NOTE) This nucleic acid amplification test was developed and its performance characteristics determined by [employer]. Nucleic acid amplification tests include PCR and TMA. This test has not been FDA cleared or approved. This test has been authorized by FDA under an Emergency Use Authorization (EUA). This test is only authorized for the duration of time the declaration that circumstances exist justifying the authorization of the emergency use of in vitro diagnostic tests for detection of SARS-CoV-2 virus and/or diagnosis of COVID-19 infection under section 564(b)(1) of the Act, 21 U.S.C. [id number]) (1), unless the authorization is terminated or revoked sooner. When diagnostic testing is negative, the possibility of a false negative result should be considered in the context of a patient's recent exposures and the presence of clinical signs and symptoms consistent with COVID-19. An individual without symptoms of COVID- 19 and who is not shedding SARS-CoV-2 vi rus would expect to have a negative (not  detected) result in this assay. Performed At: [facility] [address] [place], [place] [id number] Rush Farmer MD [id number]    [place]  Final    Comment: Performed at [facility], [place] [address] [place], [place] [address]  SARS CORONAVIRUS 2 (TAT 6-24 HRS) Nasopharyngeal Nasopharyngeal Swab     Status: None   Collection Time: 09/28/19 11:53 PM   Specimen: Nasopharyngeal Swab  Result Value Ref Range Status   SARS Coronavirus 2 NEGATIVE NEGATIVE Final    Comment: (NOTE) SARS-CoV-2 target nucleic acids are NOT DETECTED. The SARS-CoV-2 RNA is generally detectable in upper and lower respiratory specimens during the acute phase of infection. Negative results do not preclude SARS-CoV-2 infection, do not rule  out co-infections with other pathogens, and should not be used as the sole basis for treatment or other patient management decisions. Negative results must be combined with clinical observations, patient history, and epidemiological information. The expected result is Negative. Fact Sheet for Patients: SugarRoll.be Fact Sheet for Healthcare Providers: https://www.woods-mathews.com/ This test is not yet approved or cleared by the [place] FDA and  has been authorized for detection and/or diagnosis of SARS-CoV-2 by FDA under an Emergency Use Authorization (EUA). This EUA will remain  in effect (meaning this test can be used) for the duration of the COVID-19 declaration under Section 56 4(b)(1) of the Act, 21 U.S.C. section 360bbb-3(b)(1), unless the authorization is terminated or revoked sooner. Performed at [facility], [place] [address], [place], [place] [address]   SARS Coronavirus 2 by RT PCR (hospital order, performed in [facility] hospital lab) Nasopharyngeal Nasopharyngeal Swab     Status: None   Collection Time: 09/28/19 11:53 PM   Specimen: Nasopharyngeal Swab  Result Value Ref Range Status   SARS Coronavirus 2 NEGATIVE NEGATIVE Final    Comment: (NOTE) If result is NEGATIVE SARS-CoV-2 target nucleic acids are NOT DETECTED. The SARS-CoV-2 RNA is generally detectable in upper and lower  respiratory specimens during the acute phase of infection. The lowest  concentration of SARS-CoV-2 viral copies this assay can detect is 250  copies / mL. A negative result does not preclude SARS-CoV-2 infection  and should not be used as the sole basis for treatment or other  patient management decisions.  A negative result may occur with  improper specimen collection / handling, submission of specimen other  than nasopharyngeal swab, presence of viral mutation(s) within the  areas targeted by this assay, and inadequate number of viral  copies  (<250 copies / mL). A negative result must be combined with clinical  observations, patient history, and epidemiological information. If result is POSITIVE SARS-CoV-2 target nucleic acids are DETECTED. The SARS-CoV-2 RNA is generally detectable in upper and lower  respiratory specimens dur ing the acute phase of infection.  Positive  results are indicative of active infection with SARS-CoV-2.  Clinical  correlation with patient history and other diagnostic information is  necessary to determine patient infection status.  Positive results do  not rule out bacterial infection or co-infection with other viruses. If result is PRESUMPTIVE POSTIVE SARS-CoV-2 nucleic acids MAY BE PRESENT.   A presumptive positive result was obtained on the submitted specimen  and confirmed on repeat testing.  While 2019 novel coronavirus  (SARS-CoV-2) nucleic acids may be present in the submitted sample  additional confirmatory testing may be necessary for epidemiological  and / or clinical management purposes  to differentiate between  SARS-CoV-2 and other Sarbecovirus currently known to  infect humans.  If clinically indicated additional testing with an alternate test  methodology [phone]) is advised. The SARS-CoV-2 RNA is generally  detectable in upper and lower respiratory sp ecimens during the acute  phase of infection. The expected result is Negative. Fact Sheet for Patients:  StrictlyIdeas.no Fact Sheet for Healthcare Providers: BankingDealers.co.za This test is not yet approved or cleared by the [place] FDA and has been authorized for detection and/or diagnosis of SARS-CoV-2 by FDA under an Emergency Use Authorization (EUA).  This EUA will remain in effect (meaning this test can be used) for the duration of the COVID-19 declaration under Section 564(b)(1) of the Act, 21 U.S.C. section 360bbb-3(b)(1), unless the authorization is terminated  or revoked sooner. Performed at [facility], [place] [address], [place], [place] [address]      Radiology Studies: Ct Soft Tissue Neck W Contrast  Result Date: 09/28/2019 CLINICAL DATA:  Dysphagia EXAM: CT NECK WITH CONTRAST TECHNIQUE: Multidetector CT imaging of the neck was performed using the standard protocol following the bolus administration of intravenous contrast. CONTRAST:  171mL OMNIPAQUE IOHEXOL 300 MG/ML  SOLN COMPARISON:  None. FINDINGS: PHARYNX AND LARYNX: --Nasopharynx: Fossae of Rosenmuller are clear. Normal adenoid tonsils for age. --Oral cavity and oropharynx: The palatine and lingual tonsils are normal. The visible oral cavity and floor of mouth are normal. --Hypopharynx: Normal vallecula and pyriform sinuses. --Larynx: Normal epiglottis and pre-epiglottic space. Normal aryepiglottic and vocal folds. --Retropharyngeal space: No abscess, effusion or lymphadenopathy. SALIVARY GLANDS: --Parotid: Both parotid glands are enlarged without focal abnormality. --Submandibular: Both are enlarged without focal abnormality. --Sublingual: Normal. No ranula or other visible lesion of the base of tongue and floor of mouth. THYROID: Normal. LYMPH NODES: No enlarged or abnormal density lymph nodes. VASCULAR: Major cervical vessels are patent. LIMITED INTRACRANIAL: Normal. VISUALIZED ORBITS: Normal. MASTOIDS AND VISUALIZED PARANASAL SINUSES: No fluid levels or advanced mucosal thickening. No mastoid effusion. SKELETON: No bony spinal canal stenosis. No lytic or blastic lesions. UPPER CHEST: Clear. OTHER: None. IMPRESSION: 1. No acute abnormality of the cervical soft tissues. 2. 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 Electronically Signed   By: Ulyses Jarred M.D.   On: 09/28/2019 21:35   Dg Chest Portable 1 View  Result Date: 09/29/2019 CLINICAL DATA:  Atrial fibrillation and difficulty swallowing for several hours EXAM:  PORTABLE CHEST 1 VIEW COMPARISON:  09/26/2019 FINDINGS: Cardiac shadows within normal limits. Skin fold is noted over the right chest. The lungs are well aerated bilaterally. Very mild left basilar atelectasis is seen. No bony abnormality is noted. IMPRESSION: Mild left basilar atelectasis. Electronically Signed   By: Inez Catalina M.D.   On: 09/29/2019 00:35    Scheduled Meds:  metoprolol tartrate  5 mg Intravenous Q6H   sucralfate  1 g Oral TID WC & HS   Continuous Infusions:   LOS: 0 days    Time spent: 30 minutes.    Barton Dubois, MD [facility] Pager [phone]  09/30/2019, 1:31 PM

## 2019-10-01 DIAGNOSIS — D472 Monoclonal gammopathy: Secondary | ICD-10-CM

## 2019-10-01 LAB — CBC
HCT: 42.8 % (ref 39.0–52.0)
Hemoglobin: 14.8 g/dL (ref 13.0–17.0)
MCH: 30.6 pg (ref 26.0–34.0)
MCHC: 34.6 g/dL (ref 30.0–36.0)
MCV: 88.4 fL (ref 80.0–100.0)
Platelets: 146 10*3/uL — ABNORMAL LOW (ref 150–400)
RBC: 4.84 MIL/uL (ref 4.22–5.81)
RDW: 12.9 % (ref 11.5–15.5)
WBC: 9.9 10*3/uL (ref 4.0–10.5)
nRBC: 0 % (ref 0.0–0.2)

## 2019-10-01 LAB — GLUCOSE, CAPILLARY
Glucose-Capillary: 117 mg/dL — ABNORMAL HIGH (ref 70–99)
Glucose-Capillary: 120 mg/dL — ABNORMAL HIGH (ref 70–99)
Glucose-Capillary: 124 mg/dL — ABNORMAL HIGH (ref 70–99)
Glucose-Capillary: 128 mg/dL — ABNORMAL HIGH (ref 70–99)
Glucose-Capillary: 99 mg/dL (ref 70–99)

## 2019-10-01 LAB — CREATININE, SERUM
Creatinine, Ser: 1.05 mg/dL (ref 0.61–1.24)
GFR calc Af Amer: >60 mL/min (ref >60)
GFR calc non Af Amer: >60 mL/min (ref >60)

## 2019-10-01 MED ORDER — ENOXAPARIN SODIUM 40 MG/0.4ML ~~LOC~~ SOLN
40.0000 mg | SUBCUTANEOUS | Status: DC
  Administered 2019-10-01 – 2019-10-02 (×2): 40 mg via SUBCUTANEOUS
  Filled 2019-10-01 (×2): qty 0.4

## 2019-10-01 MED ORDER — GUAIFENESIN-DM 100-10 MG/5ML PO SYRP
5.0000 mL | ORAL_SOLUTION | ORAL | Status: DC | PRN
  Administered 2019-10-01 – 2019-10-02 (×4): 5 mL via ORAL
  Filled 2019-10-01 (×5): qty 5

## 2019-10-01 MED ORDER — MAGIC MOUTHWASH W/LIDOCAINE
5.0000 mL | Freq: Four times a day (QID) | ORAL | Status: DC
  Administered 2019-10-01 – 2019-10-04 (×8): 5 mL via ORAL
  Filled 2019-10-01 (×17): qty 5

## 2019-10-01 MED ORDER — DEXAMETHASONE SODIUM PHOSPHATE 10 MG/ML IJ SOLN
10.0000 mg | INTRAMUSCULAR | Status: DC
  Administered 2019-10-01: 10 mg via INTRAVENOUS
  Filled 2019-10-01: qty 1

## 2019-10-01 MED ORDER — LABETALOL HCL 5 MG/ML IV SOLN: 10.0000 mg | INTRAVENOUS | Status: DC | PRN

## 2019-10-01 NOTE — Progress Notes (Signed)
PROGRESS NOTE  Kinneth Shaler [id number] DOB: Oct 13, 1946   PCP: Vivi Barrack, MD  Patient is from: Home.  DOA: 09/28/2019 LOS: 1  Brief Narrative / Interim history: 73 year old male with history of atrial flutter s/p ablation, not on anticoagulation, prediabetes, Graves' disease s/p RAI therapy, and vitiligo presenting with 4 to 5 days of dysphagia/odynophagia.  In ED, CT soft neck tissue without acute finding.  He went into A. fib with RVR that has resolved with Cardizem push.  GI consulted and patient was admitted.  Started on ceftriaxone and steroid for possible acute epiglottitis.  Patient had an EGD on 09/29/2019 that revealed ulceration of the epiglottitis, normal esophagus, nonobstructing Schatzki ring, erosive gastropathy with no bleeding and erythematous to adenopathy.  Pathology obtained and pending.  GI recommended ENT consultation, Protonix 40 mg twice daily for 8 weeks and avoiding NSAID.  ENT consulted 11/24 started Xylocaine and Chloraseptic sprays and recommended autoimmune evaluation (lupus, RA, Sjogren's and Behcet's syndrome).    Subjective: Continues to endorse significant dysphagia and odynophagia.  Reportedly tolerating some clear liquid but has not been able to swallow his pills.  Somewhat frustrated not getting an answer to the cause of this.  Also concern about adequate nutrition due to his dysphagia and odynophagia.  He also reports cough with whitish phlegm but denies hemoptysis.  He asked a lot of questions.  We also went over his EGD pictures and findings.   Objective: Vitals:   09/30/19 2012 10/01/19 0333 10/01/19 0552 10/01/19 1207  BP: (!) 155/107  (!) 169/101 (!) 146/97  Pulse: (!) 107  72 (!) 108  Resp: 16  16 19   Temp: 98.4 F (36.9 C)  97.6 F (36.4 C) 98.1 F (36.7 C)  TempSrc: Oral  Oral Oral  SpO2: 95%  96% 99%  Weight:  125.2 kg    Height:        Intake/Output Summary (Last 24 hours) at 10/01/2019 1312 Last data filed at 10/01/2019  0400 Gross per 24 hour  Intake 480 ml  Output 600 ml  Net -120 ml   Filed Weights   09/29/19 0802 09/30/19 0440 10/01/19 0333  Weight: 124.7 kg 124.9 kg 125.2 kg    Examination:  GENERAL: No acute distress.  Appears well.  HEENT: MMM.  Vision and hearing grossly intact.  NECK: Supple.  No apparent JVD.  RESP:  No IWOB. Good air movement bilaterally. CVS:  RRR. Heart sounds normal.  ABD/GI/GU: Bowel sounds present. Soft. Non tender.  MSK/EXT:  No apparent deformity or edema. Moves extremities. SKIN: no apparent skin lesion or wound NEURO: Awake, alert and oriented appropriately.  No gross deficit.  PSYCH: Calm. Normal affect.  Procedures:  11/23-EGD revealed ulceration of the epiglottitis, normal esophagus, nonobstructing Schatzki ring, erosive gastropathy with no bleeding and erythematous to adenopathy.  Pathology obtained.   Assessment & Plan: Dysphagia/odynophagia due to acute ulcerative epiglottitis of unclear etiology-still with significant dysphagia and odynophagia. -EGD on 11/23-revealed ulceration of epiglottitis, as well as gastritis and duodenitis. -ENT recommended autoimmune work-up and was started on lidocaine and Chloraseptic spray. -RF, anti-CCP, SS-B, SS-8 and anti-dsDNA ordered -Added IgM for CMV, VZV, HSV 1 and 2. -We will add Magic mouthwash -Continue IV ceftriaxone -Wean IV dexamethasone to 10 mg -SLP eval. -Manage gastritis as below.  Gastritis/duodenitis likely due to NSAIDs: Reportedly takes full dose aspirin daily. -Advised to stop aspirin-no clear indication -Continue Protonix 40 mg twice daily for 8 weeks per GI -Outpatient follow-up with GI. -Follow pathology results.  Essential hypertension: BP slightly elevated this morning due to inability to take his p.o. medication -Continue metoprolol 5 mg every 6 hours -Change hydralazine to labetalol for SBP or DBP above 100. HR in 90s to 100.  MGUS: Stable -Outpatient follow-up  History of Graves'  disease status post RAI therapy: Not on thyroid replacement therapy.  Chronic atrial fibrillation: CHA2DS2-VASc score 2 (age and HTN) -Scheduled IV metoprolol -We will discuss anticoagulation                       DVT prophylaxis: Start subcu Lovenox Code Status: Full code Family Communication: Patient and/or RN. Available if any question. Disposition Plan: Remains inpatient given significant dysphagia and odynophagia. Consultants: GI, ENT  Microbiology summarized: SARS-CoV-2 screen negative.  Sch Meds:  Scheduled Meds: . dexamethasone (DECADRON) injection  10 mg Intravenous Q24H  . enoxaparin (LOVENOX) injection  40 mg Subcutaneous Q24H  . magic mouthwash w/lidocaine  5 mL Oral QID  . metoprolol tartrate  5 mg Intravenous Q6H  . pantoprazole (PROTONIX) IV  40 mg Intravenous Q12H  . sucralfate  1 g Oral TID WC & HS   Continuous Infusions: . cefTRIAXone (ROCEPHIN)  IV 2 g (09/30/19 1634)   PRN Meds:.acetaminophen **OR** acetaminophen, labetalol, neomycin-bacitracin-polymyxin, ondansetron **OR** ondansetron (ZOFRAN) IV, oxymetazoline, phenol, silver nitrate applicators  Antimicrobials: Anti-infectives (From admission, onward)   Start     Dose/Rate Route Frequency Ordered Stop   09/30/19 1600  cefTRIAXone (ROCEPHIN) 2 g in sodium chloride 0.9 % 100 mL IVPB     2 g 200 mL/hr over 30 Minutes Intravenous Every 24 hours 09/30/19 1353         I have personally reviewed the following labs and images: CBC: Recent Labs  Lab 09/26/19 0717 09/28/19 2333 09/29/19 0334  WBC 7.5 9.8 8.7  NEUTROABS  --  5.4  --   HGB 13.7 14.0 13.3  HCT 40.6 41.8 40.5  MCV 90.8 92.5 94.2  PLT 138* 151 133*   BMP &GFR Recent Labs  Lab 09/26/19 0717 09/28/19 2333 09/29/19 0334  NA 136 140 141  K 3.7 3.6 3.5  CL 102 107 112*  CO2 23 22 20*  GLUCOSE 113* 87 82  BUN 13 23 22   CREATININE 1.10 1.15 1.19  CALCIUM 9.2 8.9 8.3*   Estimated Creatinine Clearance: 78.9 mL/min (by  C-G formula based on SCr of 1.19 mg/dL). Liver & Pancreas: No results for input(s): AST, ALT, ALKPHOS, BILITOT, PROT, ALBUMIN in the last 168 hours. No results for input(s): LIPASE, AMYLASE in the last 168 hours. No results for input(s): AMMONIA in the last 168 hours. Diabetic: No results for input(s): HGBA1C in the last 72 hours. Recent Labs  Lab 09/30/19 1157 09/30/19 1728 10/01/19 0553 10/01/19 0733 10/01/19 1142  GLUCAP 81 119* 117* 128* 120*   Cardiac Enzymes: No results for input(s): CKTOTAL, CKMB, CKMBINDEX, TROPONINI in the last 168 hours. No results for input(s): PROBNP in the last 8760 hours. Coagulation Profile: No results for input(s): INR, PROTIME in the last 168 hours. Thyroid Function Tests: Recent Labs    09/29/19 0334  TSH 0.773   Lipid Profile: No results for input(s): CHOL, HDL, LDLCALC, TRIG, CHOLHDL, LDLDIRECT in the last 72 hours. Anemia Panel: No results for input(s): VITAMINB12, FOLATE, FERRITIN, TIBC, IRON, RETICCTPCT in the last 72 hours. Urine analysis:    Component Value Date/Time   COLORURINE YELLOW 02/03/2013 0140   APPEARANCEUR CLEAR 02/03/2013 0140   LABSPEC <=1.005 03/26/2019 1816  PHURINE >=9.0 03/26/2019 1816   GLUCOSEU 500 (A) 03/26/2019 1816   HGBUR LARGE (A) 03/26/2019 1816   BILIRUBINUR LARGE (A) 03/26/2019 1816   BILIRUBINUR n 04/27/2014 1123   KETONESUR >=160 (A) 03/26/2019 1816   PROTEINUR >=300 (A) 03/26/2019 1816   UROBILINOGEN >=8.0 03/26/2019 1816   NITRITE POSITIVE (A) 03/26/2019 1816   LEUKOCYTESUR LARGE (A) 03/26/2019 1816   Sepsis Labs: Invalid input(s): PROCALCITONIN, [place]  Microbiology: Recent Results (from the past 240 hour(s))  Group A Strep by PCR     Status: None   Collection Time: 09/26/19  8:30 AM   Specimen: Throat; Sterile Swab  Result Value Ref Range Status   Group A Strep by PCR NOT DETECTED NOT DETECTED Final    Comment: Performed at [facility], [address] [place], [place]  [address]  Novel Coronavirus, NAA (Hosp order, Send-out to Ref Lab; TAT 18-24 hrs     Status: None   Collection Time: 09/26/19  8:30 AM   Specimen: Throat; Respiratory  Result Value Ref Range Status   SARS-CoV-2, NAA NOT DETECTED NOT DETECTED Final    Comment: (NOTE) This nucleic acid amplification test was developed and its performance characteristics determined by [employer]. Nucleic acid amplification tests include PCR and TMA. This test has not been FDA cleared or approved. This test has been authorized by FDA under an Emergency Use Authorization (EUA). This test is only authorized for the duration of time the declaration that circumstances exist justifying the authorization of the emergency use of in vitro diagnostic tests for detection of SARS-CoV-2 virus and/or diagnosis of COVID-19 infection under section 564(b)(1) of the Act, 21 U.S.C. [id number]) (1), unless the authorization is terminated or revoked sooner. When diagnostic testing is negative, the possibility of a false negative result should be considered in the context of a patient's recent exposures and the presence of clinical signs and symptoms consistent with COVID-19. An individual without symptoms of COVID- 19 and who is not shedding SARS-CoV-2 vi rus would expect to have a negative (not detected) result in this assay. Performed At: [facility] [address] [place], [place] [id number] Rush Farmer MD [id number]    [place]  Final    Comment: Performed at [facility], [place] [address] [place], [place] [address]  SARS CORONAVIRUS 2 (TAT 6-24 HRS) Nasopharyngeal Nasopharyngeal Swab     Status: None   Collection Time: 09/28/19 11:53 PM   Specimen: Nasopharyngeal Swab  Result Value Ref Range Status   SARS Coronavirus 2 NEGATIVE NEGATIVE Final    Comment: (NOTE) SARS-CoV-2 target nucleic acids are NOT DETECTED. The SARS-CoV-2 RNA is generally detectable in upper  and lower respiratory specimens during the acute phase of infection. Negative results do not preclude SARS-CoV-2 infection, do not rule out co-infections with other pathogens, and should not be used as the sole basis for treatment or other patient management decisions. Negative results must be combined with clinical observations, patient history, and epidemiological information. The expected result is Negative. Fact Sheet for Patients: SugarRoll.be Fact Sheet for Healthcare Providers: https://www.woods-mathews.com/ This test is not yet approved or cleared by the [place] FDA and  has been authorized for detection and/or diagnosis of SARS-CoV-2 by FDA under an Emergency Use Authorization (EUA). This EUA will remain  in effect (meaning this test can be used) for the duration of the COVID-19 declaration under Section 56 4(b)(1) of the Act, 21 U.S.C. section 360bbb-3(b)(1), unless the authorization is terminated or revoked  sooner. Performed at [facility], [place] [address] [place], [place] [address]   SARS Coronavirus 2 by RT PCR (hospital order, performed in [facility] hospital lab) Nasopharyngeal Nasopharyngeal Swab     Status: None   Collection Time: 09/28/19 11:53 PM   Specimen: Nasopharyngeal Swab  Result Value Ref Range Status   SARS Coronavirus 2 NEGATIVE NEGATIVE Final    Comment: (NOTE) If result is NEGATIVE SARS-CoV-2 target nucleic acids are NOT DETECTED. The SARS-CoV-2 RNA is generally detectable in upper and lower  respiratory specimens during the acute phase of infection. The lowest  concentration of SARS-CoV-2 viral copies this assay can detect is 250  copies / mL. A negative result does not preclude SARS-CoV-2 infection  and should not be used as the sole basis for treatment or other  patient management decisions.  A negative result may occur with  improper specimen collection / handling, submission of specimen other   than nasopharyngeal swab, presence of viral mutation(s) within the  areas targeted by this assay, and inadequate number of viral copies  (<250 copies / mL). A negative result must be combined with clinical  observations, patient history, and epidemiological information. If result is POSITIVE SARS-CoV-2 target nucleic acids are DETECTED. The SARS-CoV-2 RNA is generally detectable in upper and lower  respiratory specimens dur ing the acute phase of infection.  Positive  results are indicative of active infection with SARS-CoV-2.  Clinical  correlation with patient history and other diagnostic information is  necessary to determine patient infection status.  Positive results do  not rule out bacterial infection or co-infection with other viruses. If result is PRESUMPTIVE POSTIVE SARS-CoV-2 nucleic acids MAY BE PRESENT.   A presumptive positive result was obtained on the submitted specimen  and confirmed on repeat testing.  While 2019 novel coronavirus  (SARS-CoV-2) nucleic acids may be present in the submitted sample  additional confirmatory testing may be necessary for epidemiological  and / or clinical management purposes  to differentiate between  SARS-CoV-2 and other Sarbecovirus currently known to infect humans.  If clinically indicated additional testing with an alternate test  methodology [phone]) is advised. The SARS-CoV-2 RNA is generally  detectable in upper and lower respiratory sp ecimens during the acute  phase of infection. The expected result is Negative. Fact Sheet for Patients:  StrictlyIdeas.no Fact Sheet for Healthcare Providers: BankingDealers.co.za This test is not yet approved or cleared by the [place] FDA and has been authorized for detection and/or diagnosis of SARS-CoV-2 by FDA under an Emergency Use Authorization (EUA).  This EUA will remain in effect (meaning this test can be used) for the duration of the  COVID-19 declaration under Section 564(b)(1) of the Act, 21 U.S.C. section 360bbb-3(b)(1), unless the authorization is terminated or revoked sooner. Performed at [facility], [place] [address], [place], [place] [address]     Radiology Studies: No results found.  35 minutes with more than 50% spent in reviewing records, counseling patient/family and coordinating care.  Caprina Wussow T. [place]  If 7PM-7AM, please contact night-coverage [email or web address] Password [email or web address] 10/01/2019, 1:12 PM

## 2019-10-01 NOTE — Progress Notes (Signed)
SLP Cancellation Note  Patient Details Name: Gordon Phillips MRN: [id number] DOB: 1946-06-29   Cancelled treatment:       Reason Eval/Treat Not Completed: Checked in on pt this morning.  Pt reports he's been able to take in more liquids than when he was first admitted but his PO intake remains limited and pt still has concerns regarding his ability to take his medications.  ENT saw pt yesterday and recommended further follow up in 2-3 weeks.  Pt endorses being open to most options for treatment re: temporary supplementation for nutrition and hydration but does want to exhaust all options before considering a feeding tube.  I discussed following up with RD with pt and RN to determine options for maximizing intake and facilitating medication administration.  Unfortunately, ST does not have much else to offer pt at this time and will sign off.  Please feel free to re-consult [place] if pt experiences a change in function that warrants further assessment.     Gracyn Allor, Selinda Orion 10/01/2019, 9:41 AM

## 2019-10-02 DIAGNOSIS — R519 Headache, unspecified: Secondary | ICD-10-CM

## 2019-10-02 DIAGNOSIS — I480 Paroxysmal atrial fibrillation: Secondary | ICD-10-CM

## 2019-10-02 LAB — VARICELLA ZOSTER ANTIBODY, IGM: Varicella-Zoster Ab, IgM: <0.91 index (ref 0.00–0.90)

## 2019-10-02 LAB — GLUCOSE, CAPILLARY
Glucose-Capillary: 116 mg/dL — ABNORMAL HIGH (ref 70–99)
Glucose-Capillary: 121 mg/dL — ABNORMAL HIGH (ref 70–99)

## 2019-10-02 LAB — RHEUMATOID FACTOR: Rheumatoid fact SerPl-aCnc: <10 IU/mL (ref 0.0–13.9)

## 2019-10-02 MED ORDER — MORPHINE SULFATE (PF) 2 MG/ML IV SOLN: 2.0000 mg | INTRAVENOUS | Status: DC | PRN

## 2019-10-02 MED ORDER — DILTIAZEM 12 MG/ML ORAL SUSPENSION
60.0000 mg | Freq: Four times a day (QID) | ORAL | Status: DC
  Administered 2019-10-02 – 2019-10-03 (×5): 60 mg via ORAL
  Filled 2019-10-02 (×6): qty 6

## 2019-10-02 MED ORDER — MORPHINE SULFATE (PF) 2 MG/ML IV SOLN
2.0000 mg | Freq: Four times a day (QID) | INTRAVENOUS | Status: AC | PRN
  Administered 2019-10-02 – 2019-10-03 (×2): 2 mg via INTRAVENOUS
  Filled 2019-10-02 (×2): qty 1

## 2019-10-02 MED ORDER — MORPHINE SULFATE (PF) 2 MG/ML IV SOLN
2.0000 mg | Freq: Once | INTRAVENOUS | Status: AC
  Administered 2019-10-02: 2 mg via INTRAVENOUS
  Filled 2019-10-02: qty 1

## 2019-10-02 MED ORDER — AMLODIPINE BESYLATE 10 MG PO TABS: 10.0000 mg | ORAL_TABLET | Freq: Every day | ORAL | Status: DC

## 2019-10-02 MED ORDER — MORPHINE SULFATE (PF) 2 MG/ML IV SOLN
2.0000 mg | Freq: Four times a day (QID) | INTRAVENOUS | Status: AC | PRN
  Administered 2019-10-02 (×2): 2 mg via INTRAVENOUS
  Filled 2019-10-02 (×2): qty 1

## 2019-10-02 MED ORDER — AMLODIPINE 1 MG/ML ORAL SUSPENSION: 10.0000 mg | Freq: Every day | ORAL | Status: DC

## 2019-10-02 MED ORDER — DILTIAZEM 12 MG/ML ORAL SUSPENSION
60.0000 mg | Freq: Four times a day (QID) | ORAL | Status: DC
  Filled 2019-10-02 (×2): qty 6

## 2019-10-02 NOTE — Progress Notes (Signed)
PROGRESS NOTE  Gordon Phillips [name] DOB: 1946-10-26   PCP: Vivi Barrack, MD  Patient is from: Home.  DOA: 09/28/2019 LOS: 2  Brief Narrative / Interim history: 73 year old male with history of atrial flutter s/p ablation, not on anticoagulation, prediabetes, Graves' disease s/p RAI therapy, and vitiligo presenting with 4 to 5 days of dysphagia/odynophagia.  In ED, CT soft neck tissue without acute finding.  He went into A. fib with RVR that has resolved with Cardizem push.  GI consulted and patient was admitted.  Started on ceftriaxone and steroid for possible acute epiglottitis.  Patient had an EGD on 09/29/2019 that revealed ulceration of the epiglottitis, normal esophagus, nonobstructing Schatzki ring, erosive gastropathy with no bleeding and erythematous to adenopathy.  Pathology obtained and pending.  GI recommended ENT consultation, Protonix 40 mg twice daily for 8 weeks and avoiding NSAID.  ENT consulted 11/24 started Xylocaine and Chloraseptic sprays and recommended autoimmune evaluation (lupus, RA, Sjogren's and Behcet's syndrome).    Subjective: Patient continues to complain significant pain with swallowing.  He says he is still struggling to swallow his pills.  He is also anxious that swallowing solid could trigger his cough and cause more pain.  He reports some improvement with Magic mouthwash.  He also complains left-sided pain from his face down to his abdomen.  He described the pain as pins-and-needles.  He specifically asks for IV medication now and at night to help him sleep.  He denies similar pain in the past.  He denies history of herpes or shingles.  I offered him gabapentin given the neuropathic nature of his pain.  He says he would like to discuss this with his wife first.  Objective: Vitals:   10/01/19 2224 10/02/19 0536 10/02/19 0653 10/02/19 0653  BP: (!) 154/108  (!) 171/93 (!) 171/93  Pulse: 79  64 64  Resp: 16  18 18   Temp: 98 F (36.7 C)  98.8 F  (37.1 C) 98.8 F (37.1 C)  TempSrc: Oral     SpO2: 96%  93% 93%  Weight:  123.7 kg    Height:  6\' 2"  (1.88 m)      Intake/Output Summary (Last 24 hours) at 10/02/2019 1137 Last data filed at 10/02/2019 0532 Gross per 24 hour  Intake 480 ml  Output 300 ml  Net 180 ml   Filed Weights   09/30/19 0440 10/01/19 0333 10/02/19 0536  Weight: 124.9 kg 125.2 kg 123.7 kg    Examination: GENERAL: No acute distress.  Appears well.  HEENT: MMM.  No apparent oropharyngeal lesion on exam. NECK: Supple.  No tenderness or lymphadenopathy. RESP:  No IWOB. Good air movement bilaterally. CVS:  RRR. Heart sounds normal.  ABD/GI/GU: Bowel sounds present. Soft. Non tender.  MSK/EXT:  Moves extremities. No apparent deformity or edema.  SKIN: Vitiligo over his scalp and in his arms. NEURO: Awake, alert and oriented appropriately.  No apparent focal neuro deficit. PSYCH: Calm. Normal affect.   Procedures:  11/23-EGD revealed ulceration of the epiglottitis, normal esophagus, nonobstructing Schatzki ring, erosive gastropathy with no bleeding and erythematous to adenopathy.  Pathology obtained.   Assessment & Plan: Dysphagia/odynophagia due to acute ulcerative epiglottitis of unclear etiology-still with significant dysphagia and odynophagia. -EGD on 11/23-revealed ulceration of epiglottitis, as well as gastritis and duodenitis. -ENT recommended autoimmune work-up and was started on lidocaine and Chloraseptic spray. -RF, anti-CCP, SS-B, SS-8 and anti-dsDNA pending. -CMV, VZV, HSV 1 and 2 IgM pending. -Continue Magic mouthwash -Continue IV ceftriaxone -Stop dexamethasone. -  SLP eval-no much to offer -Manage gastritis as below. -Consult dietitian for dietary education  Left-sided pain: Neurocardiac in nature per patient's description.  Denies history of shingles or herpes.  No apparent lesion. -Offered gabapentin-but lies to discuss with his wife first. -Infectious work-up as above.   Gastritis/duodenitis likely due to NSAIDs: Reportedly takes full dose aspirin daily. -Advised to stop aspirin-no clear indication -Continue Protonix 40 mg twice daily for 8 weeks per GI -Outpatient follow-up with GI. -Follow pathology results.  MGUS: Stable -Outpatient follow-up  History of Graves' disease status post RAI therapy: Not on thyroid replacement therapy.  TSH within normal range.  Chronic atrial fibrillation: CHA2DS2-VASc score 2 (age and HTN) -P.o. Cardizem suspension 60 mg every 6 hours -Discontinued IV metoprolol -Continue as needed labetalol -Will discuss anticoagulation risk and benefits.  Essential hypertension: BP elevated this morning. -Cardiac meds as above.                      DVT prophylaxis: Start subcu Lovenox Code Status: Full code Family Communication: Patient and/or RN. Available if any question. Disposition Plan: Remains inpatient given significant pain, dysphagia and odynophagia. Consultants: GI, ENT  Microbiology summarized: SARS-CoV-2 screen negative.  Sch Meds:  Scheduled Meds: . dexamethasone (DECADRON) injection  10 mg Intravenous Q24H  . diltiazem  60 mg Oral Q6H  . enoxaparin (LOVENOX) injection  40 mg Subcutaneous Q24H  . magic mouthwash w/lidocaine  5 mL Oral QID  . pantoprazole (PROTONIX) IV  40 mg Intravenous Q12H  . sucralfate  1 g Oral TID WC & HS   Continuous Infusions: . cefTRIAXone (ROCEPHIN)  IV 2 g (10/01/19 1640)   PRN Meds:.acetaminophen **OR** acetaminophen, guaiFENesin-dextromethorphan, labetalol, morphine injection, neomycin-bacitracin-polymyxin, ondansetron **OR** ondansetron (ZOFRAN) IV, oxymetazoline, phenol, silver nitrate applicators  Antimicrobials: Anti-infectives (From admission, onward)   Start     Dose/Rate Route Frequency Ordered Stop   09/30/19 1600  cefTRIAXone (ROCEPHIN) 2 g in sodium chloride 0.9 % 100 mL IVPB     2 g 200 mL/hr over 30 Minutes Intravenous Every 24 hours 09/30/19 1353          I have personally reviewed the following labs and images: CBC: Recent Labs  Lab 09/26/19 0717 09/28/19 2333 09/29/19 0334 10/01/19 1416  WBC 7.5 9.8 8.7 9.9  NEUTROABS  --  5.4  --   --   HGB 13.7 14.0 13.3 14.8  HCT 40.6 41.8 40.5 42.8  MCV 90.8 92.5 94.2 88.4  PLT 138* 151 133* 146*   BMP &GFR Recent Labs  Lab 09/26/19 0717 09/28/19 2333 09/29/19 0334 10/01/19 1416  NA 136 140 141  --   K 3.7 3.6 3.5  --   CL 102 107 112*  --   CO2 23 22 20*  --   GLUCOSE 113* 87 82  --   BUN 13 23 22   --   CREATININE 1.10 1.15 1.19 1.05  CALCIUM 9.2 8.9 8.3*  --    Estimated Creatinine Clearance: 88.9 mL/min (by C-G formula based on SCr of 1.05 mg/dL). Liver & Pancreas: No results for input(s): AST, ALT, ALKPHOS, BILITOT, PROT, ALBUMIN in the last 168 hours. No results for input(s): LIPASE, AMYLASE in the last 168 hours. No results for input(s): AMMONIA in the last 168 hours. Diabetic: No results for input(s): HGBA1C in the last 72 hours. Recent Labs  Lab 10/01/19 0733 10/01/19 1142 10/01/19 1752 10/01/19 2341 10/02/19 0534  GLUCAP 128* 120* 99 124* 116*   Cardiac Enzymes:  No results for input(s): CKTOTAL, CKMB, CKMBINDEX, TROPONINI in the last 168 hours. No results for input(s): PROBNP in the last 8760 hours. Coagulation Profile: No results for input(s): INR, PROTIME in the last 168 hours. Thyroid Function Tests: No results for input(s): TSH, T4TOTAL, FREET4, T3FREE, THYROIDAB in the last 72 hours. Lipid Profile: No results for input(s): CHOL, HDL, LDLCALC, TRIG, CHOLHDL, LDLDIRECT in the last 72 hours. Anemia Panel: No results for input(s): VITAMINB12, FOLATE, FERRITIN, TIBC, IRON, RETICCTPCT in the last 72 hours. Urine analysis:    Component Value Date/Time   COLORURINE YELLOW 02/03/2013 0140   APPEARANCEUR CLEAR 02/03/2013 0140   LABSPEC <=1.005 03/26/2019 1816   PHURINE >=9.0 03/26/2019 1816   GLUCOSEU 500 (A) 03/26/2019 1816   HGBUR LARGE (A)  03/26/2019 1816   BILIRUBINUR LARGE (A) 03/26/2019 1816   BILIRUBINUR n 04/27/2014 1123   KETONESUR >=160 (A) 03/26/2019 1816   PROTEINUR >=300 (A) 03/26/2019 1816   UROBILINOGEN >=8.0 03/26/2019 1816   NITRITE POSITIVE (A) 03/26/2019 1816   LEUKOCYTESUR LARGE (A) 03/26/2019 1816   Sepsis Labs: Invalid input(s): PROCALCITONIN, [place]  Microbiology: Recent Results (from the past 240 hour(s))  Group A Strep by PCR     Status: None   Collection Time: 09/26/19  8:30 AM   Specimen: Throat; Sterile Swab  Result Value Ref Range Status   Group A Strep by PCR NOT DETECTED NOT DETECTED Final    Comment: Performed at [facility], [address] [place], [place] [address]  Novel Coronavirus, NAA (Hosp order, Send-out to Ref Lab; TAT 18-24 hrs     Status: None   Collection Time: 09/26/19  8:30 AM   Specimen: Throat; Respiratory  Result Value Ref Range Status   SARS-CoV-2, NAA NOT DETECTED NOT DETECTED Final    Comment: (NOTE) This nucleic acid amplification test was developed and its performance characteristics determined by [employer]. Nucleic acid amplification tests include PCR and TMA. This test has not been FDA cleared or approved. This test has been authorized by FDA under an Emergency Use Authorization (EUA). This test is only authorized for the duration of time the declaration that circumstances exist justifying the authorization of the emergency use of in vitro diagnostic tests for detection of SARS-CoV-2 virus and/or diagnosis of COVID-19 infection under section 564(b)(1) of the Act, 21 U.S.C. [id number]) (1), unless the authorization is terminated or revoked sooner. When diagnostic testing is negative, the possibility of a false negative result should be considered in the context of a patient's recent exposures and the presence of clinical signs and symptoms consistent with COVID-19. An individual without symptoms of COVID- 19 and who is not shedding  SARS-CoV-2 vi rus would expect to have a negative (not detected) result in this assay. Performed At: [facility] [address] [place], [place] [id number] Rush Farmer MD [id number]    [place]  Final    Comment: Performed at [facility], [place] [address] [place], [place] [address]  SARS CORONAVIRUS 2 (TAT 6-24 HRS) Nasopharyngeal Nasopharyngeal Swab     Status: None   Collection Time: 09/28/19 11:53 PM   Specimen: Nasopharyngeal Swab  Result Value Ref Range Status   SARS Coronavirus 2 NEGATIVE NEGATIVE Final    Comment: (NOTE) SARS-CoV-2 target nucleic acids are NOT DETECTED. The SARS-CoV-2 RNA is generally detectable in upper and lower respiratory specimens during the acute phase of infection. Negative results do not preclude SARS-CoV-2 infection, do not rule out co-infections with other pathogens,  and should not be used as the sole basis for treatment or other patient management decisions. Negative results must be combined with clinical observations, patient history, and epidemiological information. The expected result is Negative. Fact Sheet for Patients: SugarRoll.be Fact Sheet for Healthcare Providers: https://www.woods-mathews.com/ This test is not yet approved or cleared by the [place] FDA and  has been authorized for detection and/or diagnosis of SARS-CoV-2 by FDA under an Emergency Use Authorization (EUA). This EUA will remain  in effect (meaning this test can be used) for the duration of the COVID-19 declaration under Section 56 4(b)(1) of the Act, 21 U.S.C. section 360bbb-3(b)(1), unless the authorization is terminated or revoked sooner. Performed at [facility], [place] [address], [place], [place] [address]   SARS Coronavirus 2 by RT PCR (hospital order, performed in [facility] hospital lab) Nasopharyngeal Nasopharyngeal Swab     Status: None   Collection Time:  09/28/19 11:53 PM   Specimen: Nasopharyngeal Swab  Result Value Ref Range Status   SARS Coronavirus 2 NEGATIVE NEGATIVE Final    Comment: (NOTE) If result is NEGATIVE SARS-CoV-2 target nucleic acids are NOT DETECTED. The SARS-CoV-2 RNA is generally detectable in upper and lower  respiratory specimens during the acute phase of infection. The lowest  concentration of SARS-CoV-2 viral copies this assay can detect is 250  copies / mL. A negative result does not preclude SARS-CoV-2 infection  and should not be used as the sole basis for treatment or other  patient management decisions.  A negative result may occur with  improper specimen collection / handling, submission of specimen other  than nasopharyngeal swab, presence of viral mutation(s) within the  areas targeted by this assay, and inadequate number of viral copies  (<250 copies / mL). A negative result must be combined with clinical  observations, patient history, and epidemiological information. If result is POSITIVE SARS-CoV-2 target nucleic acids are DETECTED. The SARS-CoV-2 RNA is generally detectable in upper and lower  respiratory specimens dur ing the acute phase of infection.  Positive  results are indicative of active infection with SARS-CoV-2.  Clinical  correlation with patient history and other diagnostic information is  necessary to determine patient infection status.  Positive results do  not rule out bacterial infection or co-infection with other viruses. If result is PRESUMPTIVE POSTIVE SARS-CoV-2 nucleic acids MAY BE PRESENT.   A presumptive positive result was obtained on the submitted specimen  and confirmed on repeat testing.  While 2019 novel coronavirus  (SARS-CoV-2) nucleic acids may be present in the submitted sample  additional confirmatory testing may be necessary for epidemiological  and / or clinical management purposes  to differentiate between  SARS-CoV-2 and other Sarbecovirus currently known to  infect humans.  If clinically indicated additional testing with an alternate test  methodology [phone]) is advised. The SARS-CoV-2 RNA is generally  detectable in upper and lower respiratory sp ecimens during the acute  phase of infection. The expected result is Negative. Fact Sheet for Patients:  StrictlyIdeas.no Fact Sheet for Healthcare Providers: BankingDealers.co.za This test is not yet approved or cleared by the [place] FDA and has been authorized for detection and/or diagnosis of SARS-CoV-2 by FDA under an Emergency Use Authorization (EUA).  This EUA will remain in effect (meaning this test can be used) for the duration of the COVID-19 declaration under Section 564(b)(1) of the Act, 21 U.S.C. section 360bbb-3(b)(1), unless the authorization is terminated or revoked sooner. Performed at [facility], [place]  [address], [place], [place] [address]     Radiology Studies: No results found.  35 minutes with more than 50% spent in reviewing records, counseling patient/family and coordinating care.   T. [place]  If 7PM-7AM, please contact night-coverage [email or web address] Password [facility] 10/02/2019, 11:37 AM

## 2019-10-02 NOTE — Progress Notes (Signed)
Consult placed to IV Team; pt has new iv in RAFA ; he doesn't like where it was placed, as it "rubs on the blanket"; left arm assessed; same vein noted on LAFA;  Pt prefers to keep the current site in the RAFA;  Site wrapped w gauze to "cushion" it; pt says he may be discharged tomorrow, and requests to keep this site.

## 2019-10-03 LAB — BASIC METABOLIC PANEL
Anion gap: 12 (ref 5–15)
BUN: 16 mg/dL (ref 8–23)
CO2: 23 mmol/L (ref 22–32)
Calcium: 8.8 mg/dL — ABNORMAL LOW (ref 8.9–10.3)
Chloride: 102 mmol/L (ref 98–111)
Creatinine, Ser: 1.1 mg/dL (ref 0.61–1.24)
GFR calc Af Amer: >60 mL/min (ref >60)
GFR calc non Af Amer: >60 mL/min (ref >60)
Glucose, Bld: 98 mg/dL (ref 70–99)
Potassium: 3.5 mmol/L (ref 3.5–5.1)
Sodium: 137 mmol/L (ref 135–145)

## 2019-10-03 LAB — CBC WITH DIFFERENTIAL/PLATELET
Abs Immature Granulocytes: 0.06 10*3/uL (ref 0.00–0.07)
Basophils Absolute: 0 10*3/uL (ref 0.0–0.1)
Basophils Relative: 0 %
Eosinophils Absolute: 0.1 10*3/uL (ref 0.0–0.5)
Eosinophils Relative: 1 %
HCT: 42.7 % (ref 39.0–52.0)
Hemoglobin: 14.8 g/dL (ref 13.0–17.0)
Immature Granulocytes: 1 %
Lymphocytes Relative: 30 %
Lymphs Abs: 2.6 10*3/uL (ref 0.7–4.0)
MCH: 30.7 pg (ref 26.0–34.0)
MCHC: 34.7 g/dL (ref 30.0–36.0)
MCV: 88.6 fL (ref 80.0–100.0)
Monocytes Absolute: 0.9 10*3/uL (ref 0.1–1.0)
Monocytes Relative: 11 %
Neutro Abs: 4.8 10*3/uL (ref 1.7–7.7)
Neutrophils Relative %: 57 %
Platelets: 124 10*3/uL — ABNORMAL LOW (ref 150–400)
RBC: 4.82 MIL/uL (ref 4.22–5.81)
RDW: 12.9 % (ref 11.5–15.5)
WBC: 8.4 10*3/uL (ref 4.0–10.5)
nRBC: 0 % (ref 0.0–0.2)

## 2019-10-03 LAB — GLUCOSE, CAPILLARY
Glucose-Capillary: 104 mg/dL — ABNORMAL HIGH (ref 70–99)
Glucose-Capillary: 93 mg/dL (ref 70–99)

## 2019-10-03 LAB — CMV IGM: CMV IgM: <30 AU/mL (ref 0.0–29.9)

## 2019-10-03 LAB — ANTI-DNA ANTIBODY, DOUBLE-STRANDED: ds DNA Ab: <1 IU/mL (ref 0–9)

## 2019-10-03 LAB — SJOGRENS SYNDROME-B EXTRACTABLE NUCLEAR ANTIBODY: SSB (La) (ENA) Antibody, IgG: 0.2 AI (ref 0.0–0.9)

## 2019-10-03 LAB — CYCLIC CITRUL PEPTIDE ANTIBODY, IGG/IGA: CCP Antibodies IgG/IgA: 4 units (ref 0–19)

## 2019-10-03 LAB — MAGNESIUM: Magnesium: 2.1 mg/dL (ref 1.7–2.4)

## 2019-10-03 LAB — SJOGRENS SYNDROME-A EXTRACTABLE NUCLEAR ANTIBODY: SSA (Ro) (ENA) Antibody, IgG: <0.2 AI (ref 0.0–0.9)

## 2019-10-03 MED ORDER — ENSURE ENLIVE PO LIQD
237.0000 mL | Freq: Two times a day (BID) | ORAL | Status: DC
  Administered 2019-10-03 – 2019-10-04 (×2): 237 mL via ORAL

## 2019-10-03 MED ORDER — APIXABAN 5 MG PO TABS
5.0000 mg | ORAL_TABLET | Freq: Two times a day (BID) | ORAL | Status: DC
  Administered 2019-10-03 – 2019-10-04 (×3): 5 mg via ORAL
  Filled 2019-10-03 (×3): qty 1

## 2019-10-03 MED ORDER — METOPROLOL SUCCINATE ER 100 MG PO TB24
100.0000 mg | ORAL_TABLET | Freq: Every day | ORAL | Status: DC
  Administered 2019-10-03: 100 mg via ORAL
  Filled 2019-10-03: qty 1

## 2019-10-03 MED ORDER — DILTIAZEM HCL ER COATED BEADS 120 MG PO CP24
120.0000 mg | ORAL_CAPSULE | Freq: Every day | ORAL | Status: DC
  Administered 2019-10-03 – 2019-10-04 (×2): 120 mg via ORAL
  Filled 2019-10-03 (×2): qty 1

## 2019-10-03 MED ORDER — POTASSIUM CHLORIDE 20 MEQ PO PACK
40.0000 meq | PACK | ORAL | Status: AC
  Administered 2019-10-03 (×2): 40 meq via ORAL
  Filled 2019-10-03 (×2): qty 2

## 2019-10-03 MED ORDER — VALACYCLOVIR HCL 500 MG PO TABS
1000.0000 mg | ORAL_TABLET | Freq: Two times a day (BID) | ORAL | Status: DC
  Administered 2019-10-03 (×2): 1000 mg via ORAL
  Filled 2019-10-03 (×2): qty 2

## 2019-10-03 MED ORDER — GABAPENTIN 250 MG/5ML PO SOLN
400.0000 mg | Freq: Three times a day (TID) | ORAL | Status: DC
  Administered 2019-10-03 – 2019-10-04 (×4): 400 mg via ORAL
  Filled 2019-10-03 (×6): qty 8

## 2019-10-03 MED ORDER — AMOXICILLIN-POT CLAVULANATE 875-125 MG PO TABS
1.0000 | ORAL_TABLET | Freq: Two times a day (BID) | ORAL | Status: DC
  Administered 2019-10-03 – 2019-10-04 (×3): 1 via ORAL
  Filled 2019-10-03 (×3): qty 1

## 2019-10-03 MED ORDER — MORPHINE SULFATE (PF) 2 MG/ML IV SOLN: 2.0000 mg | Freq: Every evening | INTRAVENOUS | Status: DC | PRN

## 2019-10-03 NOTE — Progress Notes (Signed)
Tele called patient has afib with 141 heart beat. Checked patient he was alert and oriented feeling pain in his throat rate 9. Notified on call triad provider and given morphine 2 mg iv.

## 2019-10-03 NOTE — Progress Notes (Signed)
PROGRESS NOTE  Gordon Phillips [id number] DOB: May 30, 1946   PCP: Vivi Barrack, MD  Patient is from: Home.  DOA: 09/28/2019 LOS: 3  Brief Narrative / Interim history: 73 year old male with history of atrial flutter s/p ablation, not on anticoagulation, prediabetes, Graves' disease s/p RAI therapy, and vitiligo presenting with 4 to 5 days of dysphagia/odynophagia.  In ED, CT soft neck tissue without acute finding.  He went into A. fib with RVR that has resolved with Cardizem push.  GI consulted and patient was admitted.  Started on ceftriaxone and steroid for possible acute epiglottitis.  Patient had an EGD on 09/29/2019 that revealed ulceration of the epiglottitis, normal esophagus, nonobstructing Schatzki ring, erosive gastropathy with no bleeding and erythematous to adenopathy.  Pathology obtained and pending.  GI recommended ENT consultation, Protonix 40 mg twice daily for 8 weeks and avoiding NSAID.  ENT consulted 11/24 started Xylocaine and Chloraseptic sprays and recommended autoimmune evaluation (lupus, RA, Sjogren's and Behcet's syndrome).    Autoimmune labs, HSV, CMV and VZV IgM ordered.  RF, anti-CCP and VZV IgM negative.  Others pending.  Subjective: Patient had intermittent RVR to 140s overnight.  Was not symptomatic from this.  However, he reports intense left-sided facial pain from top of his head to his neck.  He describes his pain as sharp, intense, throbbing, cramping... Denies similar pain in the past.  Does not recall cold sore or oral lesion in the past. Denies changes to his vision or hearing.  Denies ringing in his ear.  He says his pain in his throat and swallowing is improved.   Objective: Vitals:   10/02/19 2123 10/02/19 2340 10/03/19 0528 10/03/19 0531  BP: (!) 166/77 (!) 161/73  (!) 157/92  Pulse: 60   (!) 50  Resp: 18   18  Temp: 98.2 F (36.8 C)   97.7 F (36.5 C)  TempSrc: Oral   Oral  SpO2: 96%   94%  Weight:   118.3 kg   Height:         Intake/Output Summary (Last 24 hours) at 10/03/2019 1138 Last data filed at 10/03/2019 0543 Gross per 24 hour  Intake 640 ml  Output --  Net 640 ml   Filed Weights   10/01/19 0333 10/02/19 0536 10/03/19 0528  Weight: 125.2 kg 123.7 kg 118.3 kg    Examination:  GENERAL: No acute distress.  Appears well.  HEENT: Arcus senilis.  MMM.  No apparent oropharyngeal or glossal lesion.  NECK: Supple.  No apparent JVD.  Full range of motion in his neck. RESP:  No IWOB. Good air movement bilaterally. CVS: IR. Rate in 90's. Heart sounds normal.  ABD/GI/GU: Bowel sounds present. Soft. Non tender.  MSK/EXT:  Moves extremities. No apparent deformity or edema.  SKIN: Vitiligo NEURO: Awake, alert and oriented appropriately.  No apparent focal neuro deficit.  Sensation intact in his left face.  No tenderness to palpation. PSYCH: Calm. Normal affect.  Procedures:  11/23-EGD revealed ulceration of the epiglottitis, normal esophagus, nonobstructing Schatzki ring, erosive gastropathy with no bleeding and erythematous to adenopathy.  Pathology obtained.   Assessment & Plan: Dysphagia/odynophagia due to acute ulcerative epiglottitis of unclear etiology-reports improvement -EGD on 11/23-revealed ulceration of epiglottitis, as well as gastritis and duodenitis. -ENT recommended autoimmune work-up and was started on lidocaine and Chloraseptic spray. -RF, anti-CCP and VZV IgM negative. -CMV, HSV 1 and 2 IgM, and SS-B, SS-8 and anti-dsDNA pending. -Continue Magic mouthwash -IV ceftriaxone 11/24-11/27 -Augmentin 11/27>> for 4 more days -Stopped dexamethasone  11/26. -SLP eval-no much to offer  Facial pain: Left-sided facial pain of unclear etiology. Differential diagnosis include trigeminal neuralgia, early HSV, migraine or cluster headache.  Exam is very reassuring.  No signs of dental, jaw or overlying skin infection or inflammation. -We will start Valtrex 1 g twice daily pending HSV IgM  result -Started gabapentin 400 mg twice daily-we will adjust as appropriate. -Antibiotics as above  Gastritis/duodenitis likely due to NSAIDs: Reportedly takes full dose aspirin daily. -Advised to stop aspirin-no clear indication -Continue Protonix 40 mg twice daily for 8 weeks per GI -Outpatient follow-up with GI. -Follow pathology results.  MGUS: Stable -Outpatient follow-up  History of Graves' disease status post RAI therapy: Not on thyroid replacement therapy.  TSH within normal range.  Paroxysmal atrial fibrillation with intermittent RVR:  CHA2DS2-VASc score 2 (age and HTN).  No history of GI bleed.  Discussed risk and benefits as below.  He is now off aspirin.  -Started Eliquis 5 mg twice daily-previously on this but discontinued after he had "ablation". -Resume home Cardizem and metoprolol-dysphagia/odynophagia has improved -Continue as needed labetalol  Essential hypertension: BP slightly elevated -Cardiac meds as above.                      DVT prophylaxis: Start subcu Lovenox Code Status: Full code Family Communication: Patient's wife available over the phone during this encounter. Disposition Plan: Remains inpatient.  Anticipate discharge home in the next 24 hrs if pain improves and HR stable Consultants: GI, ENT  Microbiology summarized: SARS-CoV-2 screen negative.  Sch Meds:  Scheduled Meds:  amoxicillin-clavulanate  1 tablet Oral Q12H   apixaban  5 mg Oral BID   diltiazem  120 mg Oral Daily   gabapentin  400 mg Oral Q8H   magic mouthwash w/lidocaine  5 mL Oral QID   metoprolol succinate  100 mg Oral QHS   pantoprazole (PROTONIX) IV  40 mg Intravenous Q12H   sucralfate  1 g Oral TID WC & HS   valACYclovir  1,000 mg Oral BID   Continuous Infusions:  PRN Meds:.acetaminophen **OR** acetaminophen, guaiFENesin-dextromethorphan, labetalol, morphine injection, neomycin-bacitracin-polymyxin, ondansetron **OR** ondansetron (ZOFRAN) IV,  oxymetazoline, phenol, silver nitrate applicators  Antimicrobials: Anti-infectives (From admission, onward)   Start     Dose/Rate Route Frequency Ordered Stop   10/03/19 1000  amoxicillin-clavulanate (AUGMENTIN) 875-125 MG per tablet 1 tablet     1 tablet Oral Every 12 hours 10/03/19 0941 10/07/19 0959   10/03/19 1000  valACYclovir (VALTREX) tablet 1,000 mg     1,000 mg Oral 2 times daily 10/03/19 0947 10/10/19 0959   09/30/19 1600  cefTRIAXone (ROCEPHIN) 2 g in sodium chloride 0.9 % 100 mL IVPB  Status:  Discontinued     2 g 200 mL/hr over 30 Minutes Intravenous Every 24 hours 09/30/19 1353 10/03/19 0941       I have personally reviewed the following labs and images: CBC: Recent Labs  Lab 09/28/19 2333 09/29/19 0334 10/01/19 1416 10/03/19 1026  WBC 9.8 8.7 9.9 8.4  NEUTROABS 5.4  --   --  4.8  HGB 14.0 13.3 14.8 14.8  HCT 41.8 40.5 42.8 42.7  MCV 92.5 94.2 88.4 88.6  PLT 151 133* 146* 124*   BMP &GFR Recent Labs  Lab 09/28/19 2333 09/29/19 0334 10/01/19 1416 10/03/19 1026  NA 140 141  --  137  K 3.6 3.5  --  3.5  CL 107 112*  --  102  CO2 22 20*  --  23  GLUCOSE 87 82  --  98  BUN 23 22  --  16  CREATININE 1.15 1.19 1.05 1.10  CALCIUM 8.9 8.3*  --  8.8*  MG  --   --   --  2.1   Estimated Creatinine Clearance: 82.9 mL/min (by C-G formula based on SCr of 1.1 mg/dL). Liver & Pancreas: No results for input(s): AST, ALT, ALKPHOS, BILITOT, PROT, ALBUMIN in the last 168 hours. No results for input(s): LIPASE, AMYLASE in the last 168 hours. No results for input(s): AMMONIA in the last 168 hours. Diabetic: No results for input(s): HGBA1C in the last 72 hours. Recent Labs  Lab 10/01/19 2341 10/02/19 0534 10/02/19 1142 10/03/19 0044 10/03/19 0619  GLUCAP 124* 116* 121* 104* 93   Cardiac Enzymes: No results for input(s): CKTOTAL, CKMB, CKMBINDEX, TROPONINI in the last 168 hours. No results for input(s): PROBNP in the last 8760 hours. Coagulation Profile: No  results for input(s): INR, PROTIME in the last 168 hours. Thyroid Function Tests: No results for input(s): TSH, T4TOTAL, FREET4, T3FREE, THYROIDAB in the last 72 hours. Lipid Profile: No results for input(s): CHOL, HDL, LDLCALC, TRIG, CHOLHDL, LDLDIRECT in the last 72 hours. Anemia Panel: No results for input(s): VITAMINB12, FOLATE, FERRITIN, TIBC, IRON, RETICCTPCT in the last 72 hours. Urine analysis:    Component Value Date/Time   COLORURINE YELLOW 02/03/2013 0140   APPEARANCEUR CLEAR 02/03/2013 0140   LABSPEC <=1.005 03/26/2019 1816   PHURINE >=9.0 03/26/2019 1816   GLUCOSEU 500 (A) 03/26/2019 1816   HGBUR LARGE (A) 03/26/2019 1816   BILIRUBINUR LARGE (A) 03/26/2019 1816   BILIRUBINUR n 04/27/2014 1123   KETONESUR >=160 (A) 03/26/2019 1816   PROTEINUR >=300 (A) 03/26/2019 1816   UROBILINOGEN >=8.0 03/26/2019 1816   NITRITE POSITIVE (A) 03/26/2019 1816   LEUKOCYTESUR LARGE (A) 03/26/2019 1816   Sepsis Labs: Invalid input(s): PROCALCITONIN, [place]  Microbiology: Recent Results (from the past 240 hour(s))  Group A Strep by PCR     Status: None   Collection Time: 09/26/19  8:30 AM   Specimen: Throat; Sterile Swab  Result Value Ref Range Status   Group A Strep by PCR NOT DETECTED NOT DETECTED Final    Comment: Performed at [facility], [address] [place], [place] [address]  Novel Coronavirus, NAA (Hosp order, Send-out to Ref Lab; TAT 18-24 hrs     Status: None   Collection Time: 09/26/19  8:30 AM   Specimen: Throat; Respiratory  Result Value Ref Range Status   SARS-CoV-2, NAA NOT DETECTED NOT DETECTED Final    Comment: (NOTE) This nucleic acid amplification test was developed and its performance characteristics determined by [employer]. Nucleic acid amplification tests include PCR and TMA. This test has not been FDA cleared or approved. This test has been authorized by FDA under an Emergency Use Authorization (EUA). This test is only authorized  for the duration of time the declaration that circumstances exist justifying the authorization of the emergency use of in vitro diagnostic tests for detection of SARS-CoV-2 virus and/or diagnosis of COVID-19 infection under section 564(b)(1) of the Act, 21 U.S.C. [id number]) (1), unless the authorization is terminated or revoked sooner. When diagnostic testing is negative, the possibility of a false negative result should be considered in the context of a patient's recent exposures and the presence of clinical signs and symptoms consistent with COVID-19. An individual without symptoms of COVID- 19 and who is not shedding SARS-CoV-2 vi rus would expect to have a  negative (not detected) result in this assay. Performed At: [facility] [address] [place], [place] [id number] Rush Farmer MD [id number]    Reform  Final    Comment: Performed at [facility], [place] [address], [place], [place] [address]  SARS CORONAVIRUS 2 (TAT 6-24 HRS) Nasopharyngeal Nasopharyngeal Swab     Status: None   Collection Time: 09/28/19 11:53 PM   Specimen: Nasopharyngeal Swab  Result Value Ref Range Status   SARS Coronavirus 2 NEGATIVE NEGATIVE Final    Comment: (NOTE) SARS-CoV-2 target nucleic acids are NOT DETECTED. The SARS-CoV-2 RNA is generally detectable in upper and lower respiratory specimens during the acute phase of infection. Negative results do not preclude SARS-CoV-2 infection, do not rule out co-infections with other pathogens, and should not be used as the sole basis for treatment or other patient management decisions. Negative results must be combined with clinical observations, patient history, and epidemiological information. The expected result is Negative. Fact Sheet for Patients: SugarRoll.be Fact Sheet for Healthcare Providers: https://www.woods-mathews.com/ This test is not yet approved or  cleared by the [place] FDA and  has been authorized for detection and/or diagnosis of SARS-CoV-2 by FDA under an Emergency Use Authorization (EUA). This EUA will remain  in effect (meaning this test can be used) for the duration of the COVID-19 declaration under Section 56 4(b)(1) of the Act, 21 U.S.C. section 360bbb-3(b)(1), unless the authorization is terminated or revoked sooner. Performed at [facility], [place] [address] [place], [place] [address]   SARS Coronavirus 2 by RT PCR (hospital order, performed in [facility] hospital lab) Nasopharyngeal Nasopharyngeal Swab     Status: None   Collection Time: 09/28/19 11:53 PM   Specimen: Nasopharyngeal Swab  Result Value Ref Range Status   SARS Coronavirus 2 NEGATIVE NEGATIVE Final    Comment: (NOTE) If result is NEGATIVE SARS-CoV-2 target nucleic acids are NOT DETECTED. The SARS-CoV-2 RNA is generally detectable in upper and lower  respiratory specimens during the acute phase of infection. The lowest  concentration of SARS-CoV-2 viral copies this assay can detect is 250  copies / mL. A negative result does not preclude SARS-CoV-2 infection  and should not be used as the sole basis for treatment or other  patient management decisions.  A negative result may occur with  improper specimen collection / handling, submission of specimen other  than nasopharyngeal swab, presence of viral mutation(s) within the  areas targeted by this assay, and inadequate number of viral copies  (<250 copies / mL). A negative result must be combined with clinical  observations, patient history, and epidemiological information. If result is POSITIVE SARS-CoV-2 target nucleic acids are DETECTED. The SARS-CoV-2 RNA is generally detectable in upper and lower  respiratory specimens dur ing the acute phase of infection.  Positive  results are indicative of active infection with SARS-CoV-2.  Clinical  correlation with patient history and other  diagnostic information is  necessary to determine patient infection status.  Positive results do  not rule out bacterial infection or co-infection with other viruses. If result is PRESUMPTIVE POSTIVE SARS-CoV-2 nucleic acids MAY BE PRESENT.   A presumptive positive result was obtained on the submitted specimen  and confirmed on repeat testing.  While 2019 novel coronavirus  (SARS-CoV-2) nucleic acids may be present in the submitted sample  additional confirmatory testing may be necessary for epidemiological  and / or clinical management purposes  to differentiate between  SARS-CoV-2 and other Sarbecovirus currently  known to infect humans.  If clinically indicated additional testing with an alternate test  methodology [phone]) is advised. The SARS-CoV-2 RNA is generally  detectable in upper and lower respiratory sp ecimens during the acute  phase of infection. The expected result is Negative. Fact Sheet for Patients:  StrictlyIdeas.no Fact Sheet for Healthcare Providers: BankingDealers.co.za This test is not yet approved or cleared by the [place] FDA and has been authorized for detection and/or diagnosis of SARS-CoV-2 by FDA under an Emergency Use Authorization (EUA).  This EUA will remain in effect (meaning this test can be used) for the duration of the COVID-19 declaration under Section 564(b)(1) of the Act, 21 U.S.C. section 360bbb-3(b)(1), unless the authorization is terminated or revoked sooner. Performed at [facility], [place] [address] [place], [place] [address]     Radiology Studies: No results found.  35 minutes with more than 50% spent in reviewing records, counseling patient/family and coordinating care.  Tru Leopard T. [place]  If 7PM-7AM, please contact night-coverage [email or web address] Password [facility] 10/03/2019, 11:38 AM

## 2019-10-03 NOTE — Progress Notes (Signed)
Initial Nutrition Assessment  DOCUMENTATION CODES:   Obesity unspecified  INTERVENTION:   Ensure Enlive po BID, each supplement provides 350 kcal and 20 grams of protein  Encourage PO intake     NUTRITION DIAGNOSIS:   Inadequate oral intake related to mouth pain as evidenced by per patient/family report.  GOAL:   Patient will meet greater than or equal to 90% of their needs  MONITOR:   PO intake, Supplement acceptance  REASON FOR ASSESSMENT:   Consult Poor PO  ASSESSMENT:   Pt with PMH of a flutter s/p ablation, prediabetes, graves dz s/p RAI therapy admitted with 4-5 days dysphagia/odynophagia. EGD 11/23 reveals ulceration of the epiglottitis, gastritis/duodenitis likely due to NSAIDs.    Per MD plan for d/c next 24 hours if continues to improve.   Medications reviewed and include: magic mouthwash, carafate Labs reviewed    NUTRITION - FOCUSED PHYSICAL EXAM:  Deferred   Diet Order:   Diet Order            Diet full liquid Room service appropriate? Yes with Assist; Fluid consistency: Thin  Diet effective now              EDUCATION NEEDS:   No education needs have been identified at this time  Skin:     Last BM:  11/25  Height:   Ht Readings from Last 1 Encounters:  10/02/19 6\' 2"  (1.88 m)    Weight:   Wt Readings from Last 1 Encounters:  10/03/19 118.3 kg    Ideal Body Weight:     BMI:  Body mass index is 33.5 kg/m.  Estimated Nutritional Needs:   Kcal:  1900-2100  Protein:  95-105 grams  Fluid:  2L/day  Maylon Peppers RD, [place], [place] Pager [phone] After Hours Pager

## 2019-10-04 LAB — CBC
HCT: 40.8 % (ref 39.0–52.0)
Hemoglobin: 13.8 g/dL (ref 13.0–17.0)
MCH: 30.7 pg (ref 26.0–34.0)
MCHC: 33.8 g/dL (ref 30.0–36.0)
MCV: 90.7 fL (ref 80.0–100.0)
Platelets: 122 10*3/uL — ABNORMAL LOW (ref 150–400)
RBC: 4.5 MIL/uL (ref 4.22–5.81)
RDW: 12.9 % (ref 11.5–15.5)
WBC: 8.1 10*3/uL (ref 4.0–10.5)
nRBC: 0 % (ref 0.0–0.2)

## 2019-10-04 LAB — GLUCOSE, CAPILLARY
Glucose-Capillary: 104 mg/dL — ABNORMAL HIGH (ref 70–99)
Glucose-Capillary: 102 mg/dL — ABNORMAL HIGH (ref 70–99)

## 2019-10-04 LAB — BASIC METABOLIC PANEL
Anion gap: 8 (ref 5–15)
BUN: 11 mg/dL (ref 8–23)
CO2: 24 mmol/L (ref 22–32)
Calcium: 8.7 mg/dL — ABNORMAL LOW (ref 8.9–10.3)
Chloride: 106 mmol/L (ref 98–111)
Creatinine, Ser: 1.01 mg/dL (ref 0.61–1.24)
GFR calc Af Amer: >60 mL/min (ref >60)
GFR calc non Af Amer: >60 mL/min (ref >60)
Glucose, Bld: 106 mg/dL — ABNORMAL HIGH (ref 70–99)
Potassium: 3.9 mmol/L (ref 3.5–5.1)
Sodium: 138 mmol/L (ref 135–145)

## 2019-10-04 LAB — HSV(HERPES SIMPLEX VRS) I + II AB-IGM: HSVI/II Comb IgM: <0.91 Ratio (ref 0.00–0.90)

## 2019-10-04 LAB — MAGNESIUM: Magnesium: 2.1 mg/dL (ref 1.7–2.4)

## 2019-10-04 MED ORDER — ENSURE ENLIVE PO LIQD: 237.0000 mL | Freq: Two times a day (BID) | ORAL | 0 refills | Status: DC

## 2019-10-04 MED ORDER — SUCRALFATE 1 GM/10ML PO SUSP: 1.0000 g | Freq: Three times a day (TID) | ORAL | 0 refills | Status: DC

## 2019-10-04 MED ORDER — LOSARTAN POTASSIUM 50 MG PO TABS
50.0000 mg | ORAL_TABLET | Freq: Every day | ORAL | Status: DC
  Administered 2019-10-04: 50 mg via ORAL
  Filled 2019-10-04: qty 1

## 2019-10-04 MED ORDER — GABAPENTIN 300 MG PO CAPS: ORAL_CAPSULE | ORAL | 0 refills | Status: DC

## 2019-10-04 MED ORDER — BENZONATATE 100 MG PO CAPS: 200.0000 mg | ORAL_CAPSULE | Freq: Two times a day (BID) | ORAL | 0 refills | Status: DC | PRN

## 2019-10-04 MED ORDER — LOSARTAN POTASSIUM 50 MG PO TABS: 50.0000 mg | ORAL_TABLET | Freq: Every day | ORAL | 1 refills | Status: DC

## 2019-10-04 MED ORDER — APIXABAN 5 MG PO TABS: 5.0000 mg | ORAL_TABLET | Freq: Two times a day (BID) | ORAL | 1 refills | Status: DC

## 2019-10-04 MED ORDER — PANTOPRAZOLE SODIUM 40 MG PO TBEC: 40.0000 mg | DELAYED_RELEASE_TABLET | Freq: Two times a day (BID) | ORAL | 0 refills | Status: DC

## 2019-10-04 MED ORDER — AMOXICILLIN-POT CLAVULANATE 875-125 MG PO TABS: 1.0000 | ORAL_TABLET | Freq: Two times a day (BID) | ORAL | 0 refills | Status: DC

## 2019-10-04 NOTE — Progress Notes (Signed)
New order to discharge patient home.  Peripheral IV and tele removed.  Morning meds given.  Discharge paperwork completed and reviewed with patient.  All questions answered.  Patient washed and dressed self.  Transported to exit by nurse tech via wheelchair.  All belongings with patient.

## 2019-10-04 NOTE — Care Management (Signed)
Patient provided Eliquis 30 day card. He states he has been on it before and it was covered well, he has 60 day supply at home, he does not remember getting coupon, he is aware that if one was used in the past that this one will not work.

## 2019-10-04 NOTE — Discharge Summary (Signed)
Physician Discharge Summary  Arty Gradillas [id number] DOB: 1946-06-20 DOA: 09/28/2019  PCP: Vivi Barrack, MD  Admit date: 09/28/2019 Discharge date: 10/04/2019  Admitted From: Home Disposition: Home  Recommendations for Outpatient Follow-up:  1. Follow up with PCP, cardiology and ENT in 1-2 weeks 2. Please obtain CBC/BMP/Mag at follow up 3. Please follow up on the following pending results: ANA  Home Health: None Equipment/Devices: None  Discharge Condition: Stable CODE STATUS: Full code  Follow-up Information    Vivi Barrack, MD. Schedule an appointment as soon as possible for a visit in 1 week(s).   Specialty: Family Medicine Contact information: [place] [address] [phone]        Buford Dresser, MD. Schedule an appointment as soon as possible for a visit in 2 week(s).   Specialty: Cardiology Contact information: [address] [place] [address] [phone]        Jodi Marble, MD. Schedule an appointment as soon as possible for a visit in 1 week(s).   Specialty: Otolaryngology Contact information: [address] [place] [address] [phone]           Hospital Course: 73 year old male with history of atrial flutter s/p ablation, not on anticoagulation, prediabetes, Graves' disease s/p RAI therapy, and vitiligo presenting with 4 to 5 days of dysphagia/odynophagia.  In ED, CT soft neck tissue without acute finding.  He went into A. fib with RVR that has resolved with Cardizem push.  GI consulted and patient was admitted.  Started on ceftriaxone and dexamethasone for possible acute epiglottitis.  Patient had an EGD on 09/29/2019 that revealed ulceration of the epiglottitis, normal esophagus, nonobstructing Schatzki ring, erosive gastropathy with no bleeding and erythematous to adenopathy.  Pathology obtained and pending.  GI recommended ENT consultation, protonix 40 mg twice daily  for 8 weeks and Carafate, and avoiding NSAID.    ENT consulted 11/24 started Xylocaine and Chloraseptic sprays and recommended autoimmune evaluation (lupus, RA, Sjogren's and Behcet's syndrome).    Autoimmune labs including RF, anti-CCP, SS-A, SS-B and anti-dsDNA negative.  Clinically low suspicion for Behcet's syndrome.  Started on Valtrex 1 g twice daily.  However, HSV, CMV and VZV IgM negative.  Valtrex discontinued after 2 doses.  Patient had left-sided facial pain.  Concern about trigeminal neuralgia based on his symptoms but no history of this.  He was a started on gabapentin with improvement in his pain.  He was discharged on gabapentin.   On the day of discharge, patient symptoms including dysphagia, odynophagia, cough and left facial pain improved significantly.  He tolerated soft diet and felt ready to go home.  See individual problem list below for more hospital course.  Discharge Diagnoses:  Dysphagia/odynophagia due to acute ulcerative epiglottitis of unclear etiology-improved. -EGD on 11/23-revealed ulceration of epiglottitis, as well as gastritis and duodenitis. -ENT recommended autoimmune work-up and outpatient follow-up. -CMV, VZV, HSV 1 and 2 IgM,   RF, anti-CCP SS-B, SS-A and anti-dsDNA negative. -IV ceftriaxone 11/24-11/27 -Augmentin 11/27>> for 4 more days -Stopped dexamethasone 11/26. -SLP eval-no much to offer.  Dietitian recommended Ensure Enlive.  Left-sided facial pain of unclear etiology.   concern for trigeminal neuralgia.  Not classic for migraine or cluster headache. Exam is very reassuring.  No signs of dental, jaw or overlying skin infection or inflammation.  Viral work-up negative as above.  Pain improved with gabapentin.  Valtrex discontinued after negative work-up. -Discharged on gabapentin 300 mg in the morning and 600  mg at night. -May consider MRI and Tegretol if worse again. -Antibiotics as above  Gastritis/duodenitis likely due to NSAIDs: Reportedly  takes full dose aspirin daily. -Advised to stop aspirin-no clear indication -Continue Protonix 40 mg twice daily for 8 weeks per GI -Outpatient follow-up with GI. -Follow pathology results.  MGUS: Stable -Outpatient follow-up  History of Graves' disease status post RAI therapy: Not on thyroid replacement therapy.  TSH within normal range.  Paroxysmal atrial fibrillation with intermittent RVR:  CHA2DS2-VASc score 2 (age and HTN).  No history of GI bleed.  Discussed risk and benefits as below.  He is now off aspirin.  -Started Eliquis 5 mg twice daily-previously on this but discontinued after he had "ablation". -Resume home Cardizem and metoprolol-dysphagia/odynophagia has improved -Outpatient follow-up with cardiology  Essential hypertension: BP slightly elevated -Added low-dose losartan at discharge.   Discharge Instructions  Discharge Instructions    Call MD for:  difficulty breathing, headache or visual disturbances   Complete by: As directed    Call MD for:  extreme fatigue   Complete by: As directed    Call MD for:  persistant dizziness or light-headedness   Complete by: As directed    Call MD for:  persistant nausea and vomiting   Complete by: As directed    Call MD for:  severe uncontrolled pain   Complete by: As directed    Diet - low sodium heart healthy   Complete by: As directed    Diet full liquid   Complete by: As directed    Discharge instructions   Complete by: As directed    It has been a pleasure taking care of you! You were hospitalized with difficulty swallowing and pain in your throat.  After the test is we have done, we noted some ulceration of your epiglottis.  It is unclear what caused this ulceration.  However, you have been treated for possible bacterial infection and inflammation with antibiotics and steroids respectively.  We are discharging you on more antibiotics to complete treatment course.  You also have an EGD that revealed some irritation  of his stomach and upper part of your small intestine.  This could be due to high-dose aspirin he has been taking.  We encourage you to stop aspirin or any other over-the-counter pain medication other than plain Tylenol.  We have started you on antiacid (Protonix and Carafate) to help with healing.  We have made some adjustment to your medications during this hospitalization. Please review your new medication list and the directions before you take your medications.  Please follow-up with your primary care doctor, cardiologist and ENT in 1 to 2 weeks.  You may have to make a phone call as soon as possible to schedule these appointments.   Take care,   Increase activity slowly   Complete by: As directed      Allergies as of 10/04/2019      Reactions   Flomax [tamsulosin Hcl] Itching   Relpax [eletriptan] Other (See Comments), Hypertension   Chest pain and sweating   Other Palpitations   Any steroids cause heart palpitations  (tolerates low doses)      Medication List    STOP taking these medications   Bayer Back & Body 500-32.5 MG Tabs Generic drug: Aspirin-Caffeine   doxazosin 2 MG tablet Commonly known as: CARDURA   olmesartan 40 MG tablet Commonly known as: BENICAR   Vicks VapoInhaler Inha     TAKE these medications   acetaminophen-codeine 300-30 MG  tablet Commonly known as: TYLENOL #3 Take 1 tablet by mouth every 6 (six) hours as needed for moderate pain.   amoxicillin-clavulanate 875-125 MG tablet Commonly known as: AUGMENTIN Take 1 tablet by mouth every 12 (twelve) hours.   apixaban 5 MG Tabs tablet Commonly known as: ELIQUIS Take 1 tablet (5 mg total) by mouth 2 (two) times daily.   AYR SALINE NASAL DROPS NA Place 2 sprays into both nostrils at bedtime.   AYR SALINE NASAL GEL NA Place 1 application into the nose at bedtime.   benzonatate 100 MG capsule Commonly known as: Tessalon Perles Take 2 capsules (200 mg total) by mouth 2 (two) times daily as needed  for cough.   cyclobenzaprine 5 MG tablet Commonly known as: FLEXERIL Take 1 tablet (5 mg total) by mouth 3 (three) times daily as needed.   diltiazem 120 MG 24 hr capsule Commonly known as: CARDIZEM CD Take 1 capsule (120 mg total) by mouth daily. What changed: when to take this   feeding supplement (ENSURE ENLIVE) Liqd Take 237 mLs by mouth 2 (two) times daily between meals.   gabapentin 300 MG capsule Commonly known as: Neurontin Take 1 capsule (300 mg total) by mouth every morning AND 2 capsules (600 mg total) at bedtime.   glucose blood test strip Use to check blood sugar once a day   hydrocortisone cream 1 % Apply 1 application topically 2 (two) times daily as needed for itching.   losartan 50 MG tablet Commonly known as: COZAAR Take 1 tablet (50 mg total) by mouth daily.   metoprolol succinate 100 MG 24 hr tablet Commonly known as: TOPROL-XL Take 1 tablet (100 mg total) by mouth at bedtime. Take with or immediately following a meal.   OVER THE COUNTER MEDICATION Take 1 tablet by mouth 2 (two) times daily. OTC - Super Beta Prostate   pantoprazole 40 MG tablet Commonly known as: Protonix Take 1 tablet (40 mg total) by mouth 2 (two) times daily.   pravastatin 40 MG tablet Commonly known as: PRAVACHOL Take 1 tablet (40 mg total) by mouth at bedtime.   PRESCRIPTION MEDICATION Inhale into the lungs at bedtime. CPAP   sildenafil 100 MG tablet Commonly known as: VIAGRA Take 100 mg by mouth daily as needed for erectile dysfunction.   sucralfate 1 GM/10ML suspension Commonly known as: CARAFATE Take 10 mLs (1 g total) by mouth 4 (four) times daily -  with meals and at bedtime.   vitamin C 500 MG tablet Commonly known as: ASCORBIC ACID Take 1,000 mg by mouth every morning.       Consultations:  Gastroenterology  ENT  Procedures/Studies: 11/23-EGD revealed ulceration of the epiglottitis, normal esophagus, nonobstructing Schatzki ring, erosive gastropathy  with no bleeding and erythematous to adenopathy.  Pathology obtained.  Ct Soft Tissue Neck W Contrast  Result Date: 09/28/2019 CLINICAL DATA:  Dysphagia EXAM: CT NECK WITH CONTRAST TECHNIQUE: Multidetector CT imaging of the neck was performed using the standard protocol following the bolus administration of intravenous contrast. CONTRAST:  139mL OMNIPAQUE IOHEXOL 300 MG/ML  SOLN COMPARISON:  None. FINDINGS: PHARYNX AND LARYNX: --Nasopharynx: Fossae of Rosenmuller are clear. Normal adenoid tonsils for age. --Oral cavity and oropharynx: The palatine and lingual tonsils are normal. The visible oral cavity and floor of mouth are normal. --Hypopharynx: Normal vallecula and pyriform sinuses. --Larynx: Normal epiglottis and pre-epiglottic space. Normal aryepiglottic and vocal folds. --Retropharyngeal space: No abscess, effusion or lymphadenopathy. SALIVARY GLANDS: --Parotid: Both parotid glands are enlarged without focal  abnormality. --Submandibular: Both are enlarged without focal abnormality. --Sublingual: Normal. No ranula or other visible lesion of the base of tongue and floor of mouth. THYROID: Normal. LYMPH NODES: No enlarged or abnormal density lymph nodes. VASCULAR: Major cervical vessels are patent. LIMITED INTRACRANIAL: Normal. VISUALIZED ORBITS: Normal. MASTOIDS AND VISUALIZED PARANASAL SINUSES: No fluid levels or advanced mucosal thickening. No mastoid effusion. SKELETON: No bony spinal canal stenosis. No lytic or blastic lesions. UPPER CHEST: Clear. OTHER: None. IMPRESSION: 1. No acute abnormality of the cervical soft tissues. 2. 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 Electronically Signed   By: Ulyses Jarred M.D.   On: 09/28/2019 21:35   Dg Chest Portable 1 View  Result Date: 09/29/2019 CLINICAL DATA:  Atrial fibrillation and difficulty swallowing for several hours EXAM: PORTABLE CHEST 1 VIEW COMPARISON:  09/26/2019  FINDINGS: Cardiac shadows within normal limits. Skin fold is noted over the right chest. The lungs are well aerated bilaterally. Very mild left basilar atelectasis is seen. No bony abnormality is noted. IMPRESSION: Mild left basilar atelectasis. Electronically Signed   By: Inez Catalina M.D.   On: 09/29/2019 00:35   Xr Chest Single View  Result Date: 09/26/2019 CLINICAL DATA:  Worsening shortness of breath for past 10 days. EXAM: PORTABLE CHEST 1 VIEW COMPARISON:  08/12/2018 FINDINGS: The heart size and mediastinal contours are within normal limits. Both lungs are clear. The visualized skeletal structures are unremarkable. IMPRESSION: No active disease. Electronically Signed   By: Marlaine Hind M.D.   On: 09/26/2019 08:06       Discharge Exam: Vitals:   10/03/19 2219 10/04/19 0643  BP: 113/78 (!) 156/88  Pulse: (!) 111 78  Resp: 18 18  Temp: 99.1 F (37.3 C) 98.7 F (37.1 C)  SpO2: 94% 94%    GENERAL: No acute distress.  Appears well.  HEENT: MMM.  Arcus senilis.  No tenderness over temporal areas. NECK: Supple.  No apparent JVD.  RESP:  No IWOB. Good air movement bilaterally. CVS:  RRR. Heart sounds normal.  ABD/GI/GU: Bowel sounds present. Soft. Non tender.  MSK/EXT:  Moves extremities. No apparent deformity or edema.  SKIN: Vitiligo NEURO: Awake, alert and oriented appropriately.  No gross deficit.  PSYCH: Calm. Normal affect.   The results of significant diagnostics from this hospitalization (including imaging, microbiology, ancillary and laboratory) are listed below for reference.     Microbiology: Recent Results (from the past 240 hour(s))  Group A Strep by PCR     Status: None   Collection Time: 09/26/19  8:30 AM   Specimen: Throat; Sterile Swab  Result Value Ref Range Status   Group A Strep by PCR NOT DETECTED NOT DETECTED Final    Comment: Performed at [facility], [address] [place],  [address]  Novel Coronavirus, NAA (Hosp order, Send-out to  Ref Lab; TAT 18-24 hrs     Status: None   Collection Time: 09/26/19  8:30 AM   Specimen: Throat; Respiratory  Result Value Ref Range Status   SARS-CoV-2, NAA NOT DETECTED NOT DETECTED Final    Comment: (NOTE) This nucleic acid amplification test was developed and its performance characteristics determined by [employer], [employer] and [employer]. Nucleic acid amplification tests include PCR and TMA. This test has not been FDA cleared or approved. This test has been authorized by FDA under an Emergency Use Authorization (EUA). This test is only authorized for the duration of time the declaration that circumstances exist justifying the authorization  of the emergency use of in vitro diagnostic tests for detection of SARS-CoV-2 virus and/or diagnosis of COVID-19 infection under section 564(b)(1) of the Act, 21 U.S.C. [id number]) (1), unless the authorization is terminated or revoked sooner. When diagnostic testing is negative, the possibility of a false negative result should be considered in the context of a patient's recent exposures and the presence of clinical signs and symptoms consistent with COVID-19. An individual without symptoms of COVID- 19 and who is not shedding SARS-CoV-2 vi rus would expect to have a negative (not detected) result in this assay. Performed At: [facility] [address] [place], [place] [id number] Rush Farmer MD [id number]    Dante  Final    Comment: Performed at [facility], [place] [address], [place], [place] [address]  SARS CORONAVIRUS 2 (TAT 6-24 HRS) Nasopharyngeal Nasopharyngeal Swab     Status: None   Collection Time: 09/28/19 11:53 PM   Specimen: Nasopharyngeal Swab  Result Value Ref Range Status   SARS Coronavirus 2 NEGATIVE NEGATIVE Final    Comment: (NOTE) SARS-CoV-2 target nucleic acids are NOT DETECTED. The SARS-CoV-2 RNA is generally detectable in upper and lower respiratory specimens during the acute phase  of infection. Negative results do not preclude SARS-CoV-2 infection, do not rule out co-infections with other pathogens, and should not be used as the sole basis for treatment or other patient management decisions. Negative results must be combined with clinical observations, patient history, and epidemiological information. The expected result is Negative. Fact Sheet for Patients: SugarRoll.be Fact Sheet for Healthcare Providers: https://www.woods-mathews.com/ This test is not yet approved or cleared by the [place] FDA and  has been authorized for detection and/or diagnosis of SARS-CoV-2 by FDA under an Emergency Use Authorization (EUA). This EUA will remain  in effect (meaning this test can be used) for the duration of the COVID-19 declaration under Section 56 4(b)(1) of the Act, 21 U.S.C. section 360bbb-3(b)(1), unless the authorization is terminated or revoked sooner. Performed at [facility], [place] [address] [place], [place] [address]   SARS Coronavirus 2 by RT PCR (hospital order, performed in [facility] hospital lab) Nasopharyngeal Nasopharyngeal Swab     Status: None   Collection Time: 09/28/19 11:53 PM   Specimen: Nasopharyngeal Swab  Result Value Ref Range Status   SARS Coronavirus 2 NEGATIVE NEGATIVE Final    Comment: (NOTE) If result is NEGATIVE SARS-CoV-2 target nucleic acids are NOT DETECTED. The SARS-CoV-2 RNA is generally detectable in upper and lower  respiratory specimens during the acute phase of infection. The lowest  concentration of SARS-CoV-2 viral copies this assay can detect is 250  copies / mL. A negative result does not preclude SARS-CoV-2 infection  and should not be used as the sole basis for treatment or other  patient management decisions.  A negative result may occur with  improper specimen collection / handling, submission of specimen other  than nasopharyngeal swab, presence of viral  mutation(s) within the  areas targeted by this assay, and inadequate number of viral copies  (<250 copies / mL). A negative result must be combined with clinical  observations, patient history, and epidemiological information. If result is POSITIVE SARS-CoV-2 target nucleic acids are DETECTED. The SARS-CoV-2 RNA is generally detectable in upper and lower  respiratory specimens dur ing the acute phase of infection.  Positive  results are indicative of active infection with SARS-CoV-2.  Clinical  correlation with patient history and other diagnostic information is  necessary  to determine patient infection status.  Positive results do  not rule out bacterial infection or co-infection with other viruses. If result is PRESUMPTIVE POSTIVE SARS-CoV-2 nucleic acids MAY BE PRESENT.   A presumptive positive result was obtained on the submitted specimen  and confirmed on repeat testing.  While 2019 novel coronavirus  (SARS-CoV-2) nucleic acids may be present in the submitted sample  additional confirmatory testing may be necessary for epidemiological  and / or clinical management purposes  to differentiate between  SARS-CoV-2 and other Sarbecovirus currently known to infect humans.  If clinically indicated additional testing with an alternate test  methodology [phone]) is advised. The SARS-CoV-2 RNA is generally  detectable in upper and lower respiratory sp ecimens during the acute  phase of infection. The expected result is Negative. Fact Sheet for Patients:  StrictlyIdeas.no Fact Sheet for Healthcare Providers: BankingDealers.co.za This test is not yet approved or cleared by the [place] FDA and has been authorized for detection and/or diagnosis of SARS-CoV-2 by FDA under an Emergency Use Authorization (EUA).  This EUA will remain in effect (meaning this test can be used) for the duration of the COVID-19 declaration under Section 564(b)(1)  of the Act, 21 U.S.C. section 360bbb-3(b)(1), unless the authorization is terminated or revoked sooner. Performed at [facility], [place] [address], [place], [place] [address]      Labs: BNP (last 3 results) No results for input(s): BNP in the last 8760 hours. Basic Metabolic Panel: Recent Labs  Lab 09/28/19 2333 09/29/19 0334 10/01/19 1416 10/03/19 1026 10/04/19 0224  NA 140 141  --  137 138  K 3.6 3.5  --  3.5 3.9  CL 107 112*  --  102 106  CO2 22 20*  --  23 24  GLUCOSE 87 82  --  98 106*  BUN 23 22  --  16 11  CREATININE 1.15 1.19 1.05 1.10 1.01  CALCIUM 8.9 8.3*  --  8.8* 8.7*  MG  --   --   --  2.1 2.1   Liver Function Tests: No results for input(s): AST, ALT, ALKPHOS, BILITOT, PROT, ALBUMIN in the last 168 hours. No results for input(s): LIPASE, AMYLASE in the last 168 hours. No results for input(s): AMMONIA in the last 168 hours. CBC: Recent Labs  Lab 09/28/19 2333 09/29/19 0334 10/01/19 1416 10/03/19 1026 10/04/19 0224  WBC 9.8 8.7 9.9 8.4 8.1  NEUTROABS 5.4  --   --  4.8  --   HGB 14.0 13.3 14.8 14.8 13.8  HCT 41.8 40.5 42.8 42.7 40.8  MCV 92.5 94.2 88.4 88.6 90.7  PLT 151 133* 146* 124* 122*   Cardiac Enzymes: No results for input(s): CKTOTAL, CKMB, CKMBINDEX, TROPONINI in the last 168 hours. BNP: Invalid input(s): POCBNP CBG: Recent Labs  Lab 10/02/19 1142 10/03/19 0044 10/03/19 0619 10/04/19 0134 10/04/19 0641  GLUCAP 121* 104* 93 104* 102*   D-Dimer No results for input(s): DDIMER in the last 72 hours. Hgb A1c No results for input(s): HGBA1C in the last 72 hours. Lipid Profile No results for input(s): CHOL, HDL, LDLCALC, TRIG, CHOLHDL, LDLDIRECT in the last 72 hours. Thyroid function studies No results for input(s): TSH, T4TOTAL, T3FREE, THYROIDAB in the last 72 hours.  Invalid input(s): FREET3 Anemia work up No results for input(s): VITAMINB12, FOLATE, FERRITIN, TIBC, IRON, RETICCTPCT in the last 72 hours. Urinalysis     Component Value Date/Time   COLORURINE YELLOW 02/03/2013 0140   APPEARANCEUR CLEAR 02/03/2013 0140  LABSPEC <=1.005 03/26/2019 1816   PHURINE >=9.0 03/26/2019 1816   GLUCOSEU 500 (A) 03/26/2019 1816   HGBUR LARGE (A) 03/26/2019 1816   BILIRUBINUR LARGE (A) 03/26/2019 1816   BILIRUBINUR n 04/27/2014 1123   KETONESUR >=160 (A) 03/26/2019 1816   PROTEINUR >=300 (A) 03/26/2019 1816   UROBILINOGEN >=8.0 03/26/2019 1816   NITRITE POSITIVE (A) 03/26/2019 1816   LEUKOCYTESUR LARGE (A) 03/26/2019 1816   Sepsis Labs Invalid input(s): PROCALCITONIN,  WBC,  LACTICIDVEN   Time coordinating discharge: 35 minutes  SIGNED:  Mercy Riding, MD  [facility] 10/04/2019, 8:13 AM  If 7PM-7AM, please contact night-coverage [email or web address] Password [email or web address]

## 2019-10-06 ENCOUNTER — Telehealth: Payer: Self-pay | Admitting: Cardiology

## 2019-10-06 NOTE — Telephone Encounter (Signed)
Left message to call back  

## 2019-10-06 NOTE — Telephone Encounter (Signed)
Patients wife called requesting she attend appointment with patient on 10/20/19 due to patient still being weak.

## 2019-10-07 ENCOUNTER — Telehealth: Payer: Self-pay

## 2019-10-07 LAB — FANA STAINING PATTERNS
Homogeneous Pattern: 1:80 {titer}
Speckled Pattern: 1:80 {titer}

## 2019-10-07 LAB — ANTINUCLEAR ANTIBODIES, IFA: ANA Ab, IFA: POSITIVE — AB

## 2019-10-07 NOTE — Telephone Encounter (Signed)
Transition Care Management Follow-up Telephone Call  Date of [facility] and from [facility]  How have you been since you were released from the hospital? Improvement in symptoms  Any questions or concerns? Yes- patient states that he continues to have pain in the left side of face/head.  He is taking Gabapentin as ordered and states that he has had to take Tylenol #3 which helped pain to reside.  He is now tolerating solid foods without problems.  Is concerned that that since discharge he has had a loss of hearing in left ear.   Items Reviewed:  Did the pt receive and understand the discharge instructions provided? Yes   Medications obtained and verified? Yes   Any new allergies since your discharge? No   Dietary orders reviewed? Yes  Do you have support at home? Yes   Other (ie: DME, Home Health, etc) n/a  Functional Questionnaire: (I = Independent and D = Dependent) ADL's: I  Bathing/Dressing- I   Meal Prep- I  Eating- I  Maintaining continence- I  Transferring/Ambulation- I  Managing Meds- I   Follow up appointments reviewed:    PCP Hospital f/u appt confirmed? Yes  Scheduled to see Dr. [name] on 10/09/19 @ 3.  [facility] f/u appt confirmed? Yes  Scheduled to see cardiology on 10/20/19 @ 320 and ENT on 10/13/19.  Are transportation arrangements needed? No   If their condition worsens, is the pt aware to call  their PCP or go to the ED? Yes  Was the patient provided with contact information for the PCP's office or ED? Yes  Was the pt encouraged to call back with questions or concerns? Yes

## 2019-10-08 LAB — SURGICAL PATHOLOGY

## 2019-10-09 ENCOUNTER — Ambulatory Visit (INDEPENDENT_AMBULATORY_CARE_PROVIDER_SITE_OTHER): Payer: Medicare HMO | Admitting: Family Medicine

## 2019-10-09 ENCOUNTER — Encounter: Payer: Self-pay | Admitting: Family Medicine

## 2019-10-09 DIAGNOSIS — R1314 Dysphagia, pharyngoesophageal phase: Secondary | ICD-10-CM | POA: Diagnosis not present

## 2019-10-09 DIAGNOSIS — I10 Essential (primary) hypertension: Secondary | ICD-10-CM | POA: Diagnosis not present

## 2019-10-09 DIAGNOSIS — R519 Headache, unspecified: Secondary | ICD-10-CM

## 2019-10-09 DIAGNOSIS — N401 Enlarged prostate with lower urinary tract symptoms: Secondary | ICD-10-CM | POA: Diagnosis not present

## 2019-10-09 MED ORDER — BENZONATATE 100 MG PO CAPS: 200.0000 mg | ORAL_CAPSULE | Freq: Two times a day (BID) | ORAL | 0 refills | Status: DC | PRN

## 2019-10-09 MED ORDER — FIRST-DUKES MOUTHWASH MT SUSP: OROMUCOSAL | 0 refills | Status: DC

## 2019-10-09 NOTE — Assessment & Plan Note (Signed)
Will restart doxazosin.  Continue home blood pressure monitoring goal 140/90 or lower.

## 2019-10-09 NOTE — Telephone Encounter (Signed)
LVM for return call. 

## 2019-10-09 NOTE — Progress Notes (Signed)
Chief Complaint:  Gordon Phillips is a 73 y.o. male who presents today for a TCM visit.  Assessment/Plan:  Dysphagia Unclear etiology.  Possibly autoimmune versus viral.  Will be following up with ENT next week.  Will refill Magic mouthwash today.  BPH (benign prostatic hyperplasia) Will restart doxazosin.  Continue home blood pressure monitoring goal 140/90 or lower.  Essential hypertension Doing well with switch from olmesartan to losartan.  We will continue this in addition to diltiazem 120 mg daily and metoprolol 100 mg daily.  Facial pain Unclear etiology - likely part of larger constellation of symptoms and likely represents autoimmune process versus viral process.  Possibly trigeminal neuralgia.  Has referral to ENT pending.  We will continue current dose of gabapentin.  Continues to have have pain may need referral to neurology.  Hearing Loss Will be following up with ENT next week.  Advised him to let them know about his hearing loss.     Subjective:  HPI:  Summary of Hospital admission: Reason for admission: Dysphagia Date of admission: 09/28/2019 Date of discharge: 10/04/2019 Date of Interactive contact: 10/07/2019 Summary of Hospital course: Patient presented to the hospital with 4 to 5 days of difficulty swallowing.  While emergency room he had CT soft tissue neck which was normal.  Unfortunately went into A. fib with RVR in the hospital.  There was concern for possible acute epiglottitis and he was admitted and put on ceftriaxone and dexamethasone.  GI was consulted.  Patient underwent EGD and was found to have ulceration of epiglottitis and erosive gastropathy.  ENT was consulted and had autoimmune work-up.  Patient symptoms improved with above treatment and he was discharged home.   Interim history outlined by problem:   Dysphagia Symptoms have improved since being discharged.  He would like a refill on Magic mouthwash.  He is still not back in 1%.  Still has some  difficulty with swallowing.  Has follow-up with ENT in 4 days.  Facial pain There was some concern for possible trigeminal neuralgia prior to him being discharged.  He was started on gabapentin.  Is currently taking 300 mg in the morning and 600 mg at night.  Tolerating well.  Pain seems to be improving however still present.  He also notes some hearing loss.  This is been ongoing since he was in the hospital.  Symptoms seem to be stable.  Symptoms have improved significantly since being discharged.  Still not 100%  #Essential hypertension - On losartan 50 mg daily, diltiazem 120 mg daily and metoprolol 100 mg daily -Patient was switched from valsartan to losartan while hospitalized.  Is done well with this change. - ROS: No reported chest pain or shortness of breath  #BPH/ED - Follows with urology - Prescribed Viagra 100 mg daily and doxazosin 2 mg daily at bedtime.  Tolerating both well. -Has not been taking doxazosin since being discharged from the hospital as it was not on his medication list.  He has noticed worsening BPH symptoms.  He would like to restart.  ROS: , otherwise a complete review of systems was negative.   PMH:  The following were reviewed and entered/updated in epic: Past Medical History:  Diagnosis Date  . ALLERGIC RHINITIS 01/25/2010  . Arthritis    "neck, back, shoulders," (02/20/2017)  . Chronic lower back pain   . High cholesterol   . Hx of cardiovascular stress test    a. Lexiscan Myoview 02/06/13:  EF 59%, no ischemia or scar  .  Hx of echocardiogram    a.  Echo 02/03/13:  Mild LVH, mild focal basal septal hypertrophy, EF 50-55%, Gr 1 DD, mild LAE  . HYPERTENSION 01/25/2010  . HYPERTHYROIDISM 01/25/2010   S/P radioactive iodine "back in the 1980s" (02/20/2017)  . Impaired glucose tolerance   . 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 Obesity   . OSA on CPAP    Uses CPAP nightly  . Pericarditis 1980's  . 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  Marland Kitchen Vitiligo    Patient Active Problem List   Diagnosis Date Noted  . Facial pain 10/09/2019  . Dysphagia 09/29/2019  . Atrial fibrillation with RVR ([place]) 09/29/2019  . Prediabetes 09/19/2019  . H/O Graves' disease 09/19/2019  . Deviated septum 08/20/2018  . BPH (benign prostatic hyperplasia) 08/20/2018  . Anal irritation 08/20/2018  . Atrial flutter ([place]) 08/12/2018  . HLD (hyperlipidemia) 02/20/2017  . History of CVA (cerebrovascular accident) 02/20/2017  . Back pain, chronic 11/22/2015  . MGUS (monoclonal gammopathy of unknown significance) 01/21/2015  . OSA (obstructive sleep apnea) 03/16/2014  . Essential hypertension 01/25/2010  . Allergic rhinitis 01/25/2010   Past Surgical History:  Procedure Laterality Date  . A-FLUTTER ABLATION N/A 04/23/2019   Procedure: A-FLUTTER ABLATION;  Surgeon: Deboraha Sprang, MD;  Location: [facility] [facility];  Service: Cardiovascular;  Laterality: N/A;  . BIOPSY  09/29/2019   Procedure: BIOPSY;  Surgeon: Thornton Park, MD;  Location: [place];  Service: Gastroenterology;;  . CARDIOVERSION N/A 08/14/2018   Procedure: CARDIOVERSION;  Surgeon: [name], MD;  Location: [facility] ENDOSCOPY;  Service: Cardiovascular;  Laterality: N/A;  . ESOPHAGOGASTRODUODENOSCOPY N/A 09/29/2019   Procedure: ESOPHAGOGASTRODUODENOSCOPY (EGD);  Surgeon: Thornton Park, MD;  Location: [place];  Service: Gastroenterology;  Laterality: N/A;  . INGUINAL HERNIA REPAIR Bilateral   . LUMBAR LAMINECTOMY    . TEE WITHOUT CARDIOVERSION N/A 08/14/2018   Procedure: TRANSESOPHAGEAL ECHOCARDIOGRAM (TEE);  Surgeon: [name], MD;  Location: [facility] ENDOSCOPY;  Service: Cardiovascular;  Laterality: N/A;  . UMBILICAL HERNIA REPAIR      Family History  Problem Relation Age of Onset  . Stroke Father        Passed away in his 78s  . Thyroid disease Sister        goiter  . Sudden death Brother        ? Drug use, no  autopsy  . Throat cancer Brother   . Colon cancer Neg Hx   . Esophageal cancer Neg Hx   . Rectal cancer Neg Hx   . Stomach cancer Neg Hx     Medications- Reconciled discharge and current medications in Epic.  Current Outpatient Medications  Medication Sig Dispense Refill  . acetaminophen-codeine (TYLENOL #3) 300-30 MG tablet Take 1 tablet by mouth every 6 (six) hours as needed for moderate pain. 10 tablet 0  . Aloe-Sodium Chloride (AYR SALINE NASAL GEL NA) Place 1 application into the nose at bedtime.    Marland Kitchen apixaban (ELIQUIS) 5 MG TABS tablet Take 1 tablet (5 mg total) by mouth 2 (two) times daily. 180 tablet 1  . AYR SALINE NASAL DROPS NA Place 2 sprays into both nostrils at bedtime.     . benzonatate (TESSALON PERLES) 100 MG capsule Take 2 capsules (200 mg total) by mouth 2 (two) times daily as needed for cough. 30 capsule 0  . cyclobenzaprine (FLEXERIL) 5 MG tablet Take 1 tablet (  5 mg total) by mouth 3 (three) times daily as needed. 10 tablet 0  . diltiazem (CARDIZEM CD) 120 MG 24 hr capsule Take 1 capsule (120 mg total) by mouth daily. (Patient taking differently: Take 120 mg by mouth at bedtime. ) 90 capsule 3  . feeding supplement, ENSURE ENLIVE, (ENSURE ENLIVE) LIQD Take 237 mLs by mouth 2 (two) times daily between meals. 14220 mL 0  . gabapentin (NEURONTIN) 300 MG capsule Take 1 capsule (300 mg total) by mouth every morning AND 2 capsules (600 mg total) at bedtime. 90 capsule 0  . glucose blood test strip Use to check blood sugar once a day 100 each 12  . hydrocortisone cream 1 % Apply 1 application topically 2 (two) times daily as needed for itching.    . losartan (COZAAR) 50 MG tablet Take 1 tablet (50 mg total) by mouth daily. 90 tablet 1  . metoprolol succinate (TOPROL-XL) 100 MG 24 hr tablet Take 1 tablet (100 mg total) by mouth at bedtime. Take with or immediately following a meal. 90 tablet 3  . OVER THE COUNTER MEDICATION Take 1 tablet by mouth 2 (two) times daily. OTC - Super  Beta Prostate    . pantoprazole (PROTONIX) 40 MG tablet Take 1 tablet (40 mg total) by mouth 2 (two) times daily. 180 tablet 0  . pravastatin (PRAVACHOL) 40 MG tablet Take 1 tablet (40 mg total) by mouth at bedtime. 90 tablet 3  . PRESCRIPTION MEDICATION Inhale into the lungs at bedtime. CPAP    . sildenafil (VIAGRA) 100 MG tablet Take 100 mg by mouth daily as needed for erectile dysfunction.     . sucralfate (CARAFATE) 1 GM/10ML suspension Take 10 mLs (1 g total) by mouth 4 (four) times daily -  with meals and at bedtime. 1200 mL 0  . vitamin C (ASCORBIC ACID) 500 MG tablet Take 1,000 mg by mouth every morning.    . Diphenhyd-Hydrocort-Nystatin (FIRST-DUKES MOUTHWASH) SUSP Use every 6-8 hours as needed. 300 mL 0   No current facility-administered medications for this visit.     Allergies-reviewed and updated Allergies  Allergen Reactions  . Flomax [Tamsulosin Hcl] Itching  . Relpax [Eletriptan] Other (See Comments) and Hypertension    Chest pain and sweating  . Other Palpitations    Any steroids cause heart palpitations  (tolerates low doses)    Social History   Socioeconomic History  . Marital status: Married    Spouse name: Not on file  . Number of children: 2  . Years of education: Not on file  . Highest education level: Not on file  Occupational History  . Occupation: Geographical information systems officer  . Financial resource strain: Not on file  . Food insecurity    Worry: Not on file    Inability: Not on file  . Transportation needs    Medical: Not on file    Non-medical: Not on file  Tobacco Use  . Smoking status: Former Smoker    Packs/day: 0.50    Years: 8.00    Pack years: 4.00    Types: Cigarettes    Quit date: 1968    Years since quitting: 52.9  . Smokeless tobacco: Never Used  Substance and Sexual Activity  . Alcohol use: Yes    Comment: 02/20/2017 "couple drinks/month on average"  . Drug use: No  . Sexual activity: Yes  Lifestyle  . Physical activity    Days per  week: Not on file    Minutes per  session: Not on file  . Stress: Not on file  Relationships  . Social Herbalist on phone: Not on file    Gets together: Not on file    Attends religious service: Not on file    Active member of club or organization: Not on file    Attends meetings of clubs or organizations: Not on file    Relationship status: Not on file  Other Topics Concern  . Not on file  Social History Narrative  . Not on file        Objective:  Physical Exam: NAD, speaking in full sentences.  No stridor.  Virtual Visit via Video   I connected with Gordon Phillips on 10/09/19 at  3:00 PM EST by a video enabled telemedicine application and verified that I am speaking with the correct person using two identifiers. I discussed the limitations of evaluation and management by telemedicine and the availability of in person appointments. The patient expressed understanding and agreed to proceed.   Patient location: Home Provider location: [place] participating in the virtual visit: Myself and Patient     Algis Greenhouse. [name], MD 10/09/2019 3:41 PM

## 2019-10-09 NOTE — Assessment & Plan Note (Signed)
Unclear etiology - likely part of larger constellation of symptoms and likely represents autoimmune process versus viral process.  Possibly trigeminal neuralgia.  Has referral to ENT pending.  We will continue current dose of gabapentin.  Continues to have have pain may need referral to neurology.

## 2019-10-09 NOTE — Assessment & Plan Note (Signed)
Unclear etiology.  Possibly autoimmune versus viral.  Will be following up with ENT next week.  Will refill Magic mouthwash today.

## 2019-10-09 NOTE — Assessment & Plan Note (Signed)
Doing well with switch from olmesartan to losartan.  We will continue this in addition to diltiazem 120 mg daily and metoprolol 100 mg daily.

## 2019-10-13 ENCOUNTER — Encounter: Payer: Self-pay | Admitting: Gastroenterology

## 2019-10-13 DIAGNOSIS — H9122 Sudden idiopathic hearing loss, left ear: Secondary | ICD-10-CM | POA: Diagnosis not present

## 2019-10-13 DIAGNOSIS — H903 Sensorineural hearing loss, bilateral: Secondary | ICD-10-CM | POA: Diagnosis not present

## 2019-10-13 DIAGNOSIS — H9113 Presbycusis, bilateral: Secondary | ICD-10-CM | POA: Diagnosis not present

## 2019-10-13 DIAGNOSIS — H905 Unspecified sensorineural hearing loss: Secondary | ICD-10-CM | POA: Diagnosis not present

## 2019-10-13 DIAGNOSIS — R070 Pain in throat: Secondary | ICD-10-CM | POA: Diagnosis not present

## 2019-10-14 ENCOUNTER — Other Ambulatory Visit: Payer: Self-pay | Admitting: Otolaryngology

## 2019-10-14 DIAGNOSIS — H9042 Sensorineural hearing loss, unilateral, left ear, with unrestricted hearing on the contralateral side: Secondary | ICD-10-CM

## 2019-10-14 NOTE — Telephone Encounter (Signed)
Per pt is getting around better and will be able to come to appt by self.Pt understands the circumstances re COVID restrictions ./cy

## 2019-10-20 ENCOUNTER — Ambulatory Visit: Payer: Medicare HMO | Admitting: Cardiology

## 2019-10-21 ENCOUNTER — Telehealth: Payer: Self-pay | Admitting: Family Medicine

## 2019-10-21 NOTE — Telephone Encounter (Signed)
See note

## 2019-10-21 NOTE — Telephone Encounter (Signed)
Rx request,Clarification on mouth wash

## 2019-10-21 NOTE — Telephone Encounter (Signed)
Patient wife called and said that [facility] on [address] has send message saying that they need intendance for patient mouth wash Diphenhyd-Hydrocort-Nystatin (FIRST-DUKES MOUTHWASH) SUSP but they have not heard back from provider. Also that there was a request send for acetaminophen-codeine (TYLENOL #3) 300-30 MG tablet because patient is in pain. IF there are any question please call wife back, thanks.

## 2019-10-21 NOTE — Telephone Encounter (Signed)
For use 3 times dialy as needed for mouth pain.  Please ask about patient's pain - would like for him to follow up with ENT as we have discussed. Ok to send in aother refill if needed.  Gordon Phillips. [name], MD 10/21/2019 1:09 PM

## 2019-10-22 ENCOUNTER — Inpatient Hospital Stay: Payer: Medicare HMO | Admitting: Family Medicine

## 2019-10-22 MED ORDER — ACETAMINOPHEN-CODEINE #3 300-30 MG PO TABS: 1.0000 | ORAL_TABLET | Freq: Four times a day (QID) | ORAL | 0 refills | Status: DC | PRN

## 2019-10-22 MED ORDER — MAGIC MOUTHWASH W/LIDOCAINE: 5.0000 mL | Freq: Three times a day (TID) | ORAL | 0 refills | Status: DC | PRN

## 2019-10-22 NOTE — Telephone Encounter (Signed)
Patient stated he is still having pain in his ear,throat and mouth.He followed up with ENT he is scheduled for a MRI.Wants to know how long should he continue Carafate.Requesting a refill on Tylenol

## 2019-10-22 NOTE — Telephone Encounter (Signed)
New rx printed - will need to fax in.  Algis Greenhouse. [name], MD 10/22/2019 9:19 AM

## 2019-10-22 NOTE — Telephone Encounter (Signed)
Spoke with pharmacy they stated this Rx is no longer available.Needs to be resubmitted with exact ingredients and quantity

## 2019-10-22 NOTE — Telephone Encounter (Signed)
Rx faxed

## 2019-10-23 ENCOUNTER — Other Ambulatory Visit: Payer: Self-pay | Admitting: Cardiology

## 2019-10-23 DIAGNOSIS — Z20822 Contact with and (suspected) exposure to covid-19: Secondary | ICD-10-CM

## 2019-10-23 DIAGNOSIS — Z20828 Contact with and (suspected) exposure to other viral communicable diseases: Secondary | ICD-10-CM | POA: Diagnosis not present

## 2019-10-24 ENCOUNTER — Encounter: Payer: Self-pay | Admitting: Cardiology

## 2019-10-24 ENCOUNTER — Other Ambulatory Visit: Payer: Self-pay

## 2019-10-24 ENCOUNTER — Ambulatory Visit: Payer: Medicare HMO | Admitting: Cardiology

## 2019-10-24 VITALS — BP 130/79 | HR 79 | Ht 74.0 in | Wt 264.4 lb

## 2019-10-24 DIAGNOSIS — Z7189 Other specified counseling: Secondary | ICD-10-CM

## 2019-10-24 DIAGNOSIS — G4733 Obstructive sleep apnea (adult) (pediatric): Secondary | ICD-10-CM

## 2019-10-24 DIAGNOSIS — Z8673 Personal history of transient ischemic attack (TIA), and cerebral infarction without residual deficits: Secondary | ICD-10-CM

## 2019-10-24 DIAGNOSIS — Z9989 Dependence on other enabling machines and devices: Secondary | ICD-10-CM | POA: Diagnosis not present

## 2019-10-24 DIAGNOSIS — I4892 Unspecified atrial flutter: Secondary | ICD-10-CM

## 2019-10-24 DIAGNOSIS — I10 Essential (primary) hypertension: Secondary | ICD-10-CM

## 2019-10-24 DIAGNOSIS — I480 Paroxysmal atrial fibrillation: Secondary | ICD-10-CM

## 2019-10-24 LAB — NOVEL CORONAVIRUS, NAA: SARS-CoV-2, NAA: NOT DETECTED

## 2019-10-24 NOTE — Progress Notes (Signed)
Cardiology Office Note:    Date:  10/24/2019   ID:  Gordon Phillips, DOB [birth year], MRN [id number]  PCP:  Vivi Barrack, MD  Cardiologist:  Buford Dresser, MD PhD EP: Dr. Caryl Comes  Referring MD: Vivi Barrack, MD   CC: follow up  History of Present Illness:    Gordon Phillips is a 73 y.o. male with a hx of atrial flutter, MGUS, back pain who is seen for follow up of his atrial flutter.  Cardiac history: 08/12/18 presented with new onset atrial flutter (initially called as a CODE STEMI). During his hospitalization, he underwent TEE/CV with conversion to NSR. He was seen for EP follow up post hospitalization to discuss flutter ablation on 09/17/18, but he was found to be in rate controlled atrial fibrillation at the time. Dofetilide load vs. Ablation discussed. After seeing Dr. Caryl Comes /2020, plan was for ablation of the flutter, but this was put on hold due to coronavirus. He underwent flutter ablation 04/23/19 by Dr. Caryl Comes.  Today: Discharged from the hospital 10/04/19 after acute neck pain/could not swallow. Thought was initially for acute epliglottitis, but EGD showed ulceration of epiglottis, schatzki ring, and erosive gastropathy without active bleeding. Treated with PPI. Had been treating back pain with NSAID, recommended to stop this. Also had episode of trigeminal neuralgia (left side), peaked thanksgiving night. Worst pain of his life. Heart rate was everywhere, nurses running in and out his room. Had afib RVR during most intense part of episode, resolved with diltiazem push. He notes that he had not been able to take his medications routinely prior to his hospitalization due to his inability to swallow.   ECG was sinus tach on 09/30/19. Got a [employer], hasn't used yet.   Was taken off the apixaban after his ablation by Dr. Caryl Comes. Has been on diltiazem and metoprolol since. Discussed consolidating today, but as he is doing well and still recovering, we will leave alone for  now.  Taking gabapentin 300 mg BID (not 600 mg at bedtime). Feels a little woozy if he takes them too close together. Recommended discussing with his PCP given that this may make his trigeminal neuralgia worse.   Denies chest pain, shortness of breath at rest or with normal exertion. No PND, orthopnea, LE edema or unexpected weight gain. No syncope or palpitations.  Past Medical History:  Diagnosis Date  . ALLERGIC RHINITIS 01/25/2010  . Arthritis    "neck, back, shoulders," (02/20/2017)  . Chronic lower back pain   . High cholesterol   . Hx of cardiovascular stress test    a. Lexiscan Myoview 02/06/13:  EF 59%, no ischemia or scar  . Hx of echocardiogram    a.  Echo 02/03/13:  Mild LVH, mild focal basal septal hypertrophy, EF 50-55%, Gr 1 DD, mild LAE  . HYPERTENSION 01/25/2010  . HYPERTHYROIDISM 01/25/2010   S/P radioactive iodine "back in the 1980s" (02/20/2017)  . Impaired glucose tolerance   . 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 Obesity   . OSA on CPAP    Uses CPAP nightly  . Pericarditis 1980's  . 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  Marland Kitchen Vitiligo     Past Surgical History:  Procedure Laterality Date  . A-FLUTTER ABLATION N/A 04/23/2019   Procedure: A-FLUTTER ABLATION;  Surgeon: Deboraha Sprang, MD;  Location: [place] CV LAB;  Service: Cardiovascular;  Laterality: N/A;  . BIOPSY  09/29/2019  Procedure: BIOPSY;  Surgeon: Thornton Park, MD;  Location: [place];  Service: Gastroenterology;;  . CARDIOVERSION N/A 08/14/2018   Procedure: CARDIOVERSION;  Surgeon: [name], MD;  Location: [facility] ENDOSCOPY;  Service: Cardiovascular;  Laterality: N/A;  . ESOPHAGOGASTRODUODENOSCOPY N/A 09/29/2019   Procedure: ESOPHAGOGASTRODUODENOSCOPY (EGD);  Surgeon: Thornton Park, MD;  Location: [place];  Service: Gastroenterology;  Laterality: N/A;  . INGUINAL HERNIA REPAIR Bilateral   . LUMBAR LAMINECTOMY    .  TEE WITHOUT CARDIOVERSION N/A 08/14/2018   Procedure: TRANSESOPHAGEAL ECHOCARDIOGRAM (TEE);  Surgeon: [name], MD;  Location: [facility] ENDOSCOPY;  Service: Cardiovascular;  Laterality: N/A;  . UMBILICAL HERNIA REPAIR      Current Medications: Current Outpatient Medications on File Prior to Visit  Medication Sig  . acetaminophen-codeine (TYLENOL #3) 300-30 MG tablet Take 1 tablet by mouth every 6 (six) hours as needed for moderate pain.  . Aloe-Sodium Chloride (AYR SALINE NASAL GEL NA) Place 1 application into the nose at bedtime.  Marland Kitchen apixaban (ELIQUIS) 5 MG TABS tablet Take 1 tablet (5 mg total) by mouth 2 (two) times daily.  Skipper Cliche SALINE NASAL DROPS NA Place 2 sprays into both nostrils at bedtime.   . benzonatate (TESSALON PERLES) 100 MG capsule Take 2 capsules (200 mg total) by mouth 2 (two) times daily as needed for cough.  . diltiazem (CARDIZEM CD) 120 MG 24 hr capsule Take 1 capsule (120 mg total) by mouth daily.  Marland Kitchen doxazosin (CARDURA) 2 MG tablet Take 2 mg by mouth at bedtime.  . gabapentin (NEURONTIN) 300 MG capsule Take 1 capsule (300 mg total) by mouth every morning AND 2 capsules (600 mg total) at bedtime.  Marland Kitchen glucose blood test strip Use to check blood sugar once a day  . hydrocortisone cream 1 % Apply 1 application topically 2 (two) times daily as needed for itching.  . losartan (COZAAR) 50 MG tablet Take 1 tablet (50 mg total) by mouth daily.  . magic mouthwash w/lidocaine SOLN Take 5 mLs by mouth 3 (three) times daily as needed for mouth pain.  . metoprolol succinate (TOPROL-XL) 100 MG 24 hr tablet Take 1 tablet (100 mg total) by mouth at bedtime. Take with or immediately following a meal.  . OVER THE COUNTER MEDICATION Take 1 tablet by mouth 2 (two) times daily. OTC - Super Beta Prostate  . pantoprazole (PROTONIX) 40 MG tablet Take 1 tablet (40 mg total) by mouth 2 (two) times daily.  . pravastatin (PRAVACHOL) 40 MG tablet Take 1 tablet (40 mg total) by mouth at bedtime.  Marland Kitchen  PRESCRIPTION MEDICATION Inhale into the lungs at bedtime. CPAP  . sildenafil (VIAGRA) 100 MG tablet Take 100 mg by mouth daily as needed for erectile dysfunction.   . sucralfate (CARAFATE) 1 GM/10ML suspension Take 10 mLs (1 g total) by mouth 4 (four) times daily -  with meals and at bedtime.  . vitamin C (ASCORBIC ACID) 500 MG tablet Take 1,000 mg by mouth every morning.   No current facility-administered medications on file prior to visit.     Allergies:   Flomax [tamsulosin hcl], Relpax [eletriptan], and Other   Social History   Tobacco Use  . Smoking status: Former Smoker    Packs/day: 0.50    Years: 8.00    Pack years: 4.00    Types: Cigarettes    Quit date: 1968    Years since quitting: 53.0  . Smokeless tobacco: Never Used  Substance Use Topics  . Alcohol use: Yes  Comment: 02/20/2017 "couple drinks/month on average"  . Drug use: No    Family History: The patient's family history includes Stroke in his father; Sudden death in his brother; Throat cancer in his brother; Thyroid disease in his sister. There is no history of Colon cancer, Esophageal cancer, Rectal cancer, or Stomach cancer.  ROS:   Please see the history of present illness.  Additional pertinent ROS negative except as noted.   EKGs/Labs/Other Studies Reviewed:    The following studies were reviewed today:  TEE 08/14/18 Study Conclusions - Left ventricle: The cavity size was normal. Wall thickness was   normal. Systolic function was mildly to moderately reduced. The   estimated ejection fraction was in the range of 40% to 45%. - Left atrium: The atrium was dilated. No evidence of thrombus in   the appendage. - Right ventricle: The cavity size was dilated. Wall thickness was   normal. - Right atrium: The atrium was dilated. No evidence of thrombus in   the atrial cavity or appendage. - Atrial septum: No defect or patent foramen ovale was identified.   Echo contrast study showed no right-to-left  atrial level shunt,   following an increase in RA pressure induced by provocative   maneuvers. - Superior vena cava: The study excluded a thrombus.  Echo 08/13/18 - Left ventricle: The cavity size was normal. Wall thickness was   increased in a pattern of mild LVH. There was mild focal basal   hypertrophy of the septum. Systolic function was normal. The   estimated ejection fraction was in the range of 55% to 60%. Wall   motion was normal; there were no regional wall motion   abnormalities. The study is not technically sufficient to allow   evaluation of LV diastolic function. - Left atrium: The atrium was moderately dilated. - Pulmonary arteries: PA peak pressure: 34 mm Hg (S).  Impressions - Normal LV function; mild LVH; moderate LAE; cannot R/O left   atrial mass; suggest TEE to further assess.  Lexiscan MPI 02/2017  Nuclear stress EF: 50%.  There was no ST segment deviation noted during stress.  The study is normal.  This is a low risk study.  The left ventricular ejection fraction is mildly decreased (45-54%).  EKG:  EKG is personally reviewed.  The ekg ordered 09/28/19  is afib RVR  Recent Labs: 08/18/2019: ALT 33 09/29/2019: TSH 0.773 10/04/2019: BUN 11; Creatinine, Ser 1.01; Hemoglobin 13.8; Magnesium 2.1; Platelets 122; Potassium 3.9; Sodium 138  Recent Lipid Panel    Component Value Date/Time   CHOL 145 08/26/2019 1424   TRIG 68.0 08/26/2019 1424   HDL 40.40 08/26/2019 1424   CHOLHDL 4 08/26/2019 1424   VLDL 13.6 08/26/2019 1424   LDLCALC 91 08/26/2019 1424   LDLDIRECT 139.2 11/21/2012 1122    Physical Exam:    VS:  BP 130/79   Pulse 79   Ht 6\' 2"  (1.88 m)   Wt 264 lb 6.4 oz (119.9 kg)   SpO2 96%   BMI 33.95 kg/m     Wt Readings from Last 3 Encounters:  10/24/19 264 lb 6.4 oz (119.9 kg)  10/03/19 260 lb 14.4 oz (118.3 kg)  09/19/19 280 lb (127 kg)    GEN: Well nourished, well developed in no acute distress HEENT: Normal, moist mucous  membranes NECK: No JVD CARDIAC: regular rhythm, normal S1 and S2, no rubs or gallops. No murmur. VASCULAR: Radial and DP pulses 2+ bilaterally. No carotid bruits RESPIRATORY:  Clear to auscultation  without rales, wheezing or rhonchi  ABDOMEN: Soft, non-tender, non-distended MUSCULOSKELETAL:  Ambulates independently SKIN: Warm and dry, no edema. Vitiligo. NEUROLOGIC:  Alert and oriented x 3. No focal neuro deficits noted. PSYCHIATRIC:  Normal affect   ASSESSMENT:    1. Paroxysmal atrial fibrillation (HCC)   2. Atrial flutter, unspecified type ([place])   3. Essential hypertension   4. Cardiac risk counseling   5. Counseling on health promotion and disease prevention    PLAN:    atrial flutter, with paroxysmal atrial fibrillation: s/p flutter ablation. Had acute episode of afib RVR while in ER for dysphagia, converted with cardizem bolus. No further events. -anticoagulation had been on hold for hematuria, but now resumed and tolerating -continue apixaban. Chadsvasc= at least 75 (old stroke seen on CT, age, hypertension) -we discussed cutting down his dual nodal agents as he has remained in rhythm. However, he is still recovering and would like to remain on current doses for now. Continue metoprolol and diltiazem -I suspect that his acute esophageal issues contributed to his afib, but if it recurs would have him discuss options with Dr. Caryl Comes for possible repeat ablation. He tends to go very fast in his afib.  -continue use of CPAP to treat OSA  Hypertension: at goal today -continue losartan -currently on diltiazem and metoprolol, see above  CV risk counseling and Secondary prevention counseling: -recommend heart healthy/Mediterranean diet, with whole grains, fruits, vegetable, fish, lean meats, nuts, and olive oil. Limit salt. -recommend moderate walking, 3-5 times/week for 30-50 minutes each session. Aim for at least 150 minutes.week. Goal should be pace of 3 miles/hours, or walking 1.5  miles in 30 minutes -recommend avoidance of tobacco products. Avoid excess alcohol. -Additional risk factor control:  -Diabetes risk: denies diagosis  -Lipids: LDL goal <70, most recent 67 on pravastatin  -Blood pressure control: as above  -Weight: BMI ~34, we have discussed lifestyle.   Plan for follow up: 3 mos or sooner PRN  Medication Adjustments/Labs and Tests Ordered: Current medicines are reviewed at length with the patient today.  Concerns regarding medicines are outlined above.  No orders of the defined types were placed in this encounter.  No orders of the defined types were placed in this encounter.   Patient Instructions  Medication Instructions:  NO CHANGES  *If you need a refill on your cardiac medications before your next appointment, please call your pharmacy*  Lab Work: NONE NEEDED  If you have labs (blood work) drawn today and your tests are completely normal, you will receive your results only by: Marland Kitchen MyChart Message (if you have MyChart) OR . A paper copy in the mail If you have any lab test that is abnormal or we need to change your treatment, we will call you to review the results.  Testing/Procedures: NONE NEEDED  Follow-Up: At [facility], you and your health needs are our priority.  As part of our continuing mission to provide you with exceptional heart care, we have created designated Provider Care Teams.  These Care Teams include your primary Cardiologist (physician) and Advanced Practice Providers (APPs -  Physician Assistants and Nurse Practitioners) who all work together to provide you with the care you need, when you need it.  Your next appointment:   3 month(s)  The format for your next appointment:   In Person  Provider:   Buford Dresser, MD     Signed, Buford Dresser, MD PhD 10/24/2019  [place]

## 2019-10-24 NOTE — Patient Instructions (Signed)
Medication Instructions:  NO CHANGES  *If you need a refill on your cardiac medications before your next appointment, please call your pharmacy*  Lab Work: NONE NEEDED  If you have labs (blood work) drawn today and your tests are completely normal, you will receive your results only by: Marland Kitchen MyChart Message (if you have MyChart) OR . A paper copy in the mail If you have any lab test that is abnormal or we need to change your treatment, we will call you to review the results.  Testing/Procedures: NONE NEEDED  Follow-Up: At [facility], you and your health needs are our priority.  As part of our continuing mission to provide you with exceptional heart care, we have created designated Provider Care Teams.  These Care Teams include your primary Cardiologist (physician) and Advanced Practice Providers (APPs -  Physician Assistants and Nurse Practitioners) who all work together to provide you with the care you need, when you need it.  Your next appointment:   3 month(s)  The format for your next appointment:   In Person  Provider:   Buford Dresser, MD

## 2019-10-25 ENCOUNTER — Other Ambulatory Visit: Payer: Self-pay | Admitting: Family Medicine

## 2019-10-27 NOTE — Telephone Encounter (Signed)
Please advise 

## 2019-10-27 NOTE — Telephone Encounter (Signed)
Please call pt - he was switched from olmesartan to losartan while in the hospital. He should not be on both.

## 2019-11-05 ENCOUNTER — Telehealth: Payer: Self-pay

## 2019-11-05 ENCOUNTER — Other Ambulatory Visit: Payer: Self-pay

## 2019-11-05 MED ORDER — GABAPENTIN 300 MG PO CAPS: ORAL_CAPSULE | ORAL | 0 refills | Status: DC

## 2019-11-05 NOTE — Telephone Encounter (Signed)
Spoke with Ashley,form faxed.

## 2019-11-05 NOTE — Telephone Encounter (Signed)
Copied from [place] [id number]. Topic: General - Inquiry >> Nov 05, 2019  2:43 PM Gordon Phillips wrote: Reason for CRM: Caryl Pina calling from [facility] regarding pt having a cpap. Please advise   Call back number [phone]

## 2019-11-13 ENCOUNTER — Telehealth: Payer: Self-pay | Admitting: Family Medicine

## 2019-11-13 NOTE — Telephone Encounter (Signed)
Left voice message to Caryl Asp stating his pulmonary clinic should be able to give him this information

## 2019-11-13 NOTE — Telephone Encounter (Signed)
[employer] called needing an authorization for the CPAP machine, they just need to know how often he uses it and if they authorize them to give Gordon Phillips one

## 2019-11-13 NOTE — Telephone Encounter (Signed)
Joy with [employer] [phone] - call her back when you get a minute - [place] - notes are good - she needs an addendum that states that he benefits from using CPAP - fax # [phone]. Acct manager needs this to proceed with the order. She is requesting a call back to let her know if you are or are not faxing the updated notes.

## 2019-11-14 ENCOUNTER — Other Ambulatory Visit: Payer: Self-pay

## 2019-11-14 ENCOUNTER — Ambulatory Visit
Admission: RE | Admit: 2019-11-14 | Discharge: 2019-11-14 | Disposition: A | Discharge status: Home / Self Care | Payer: Medicare HMO | Source: Ambulatory Visit | Attending: Otolaryngology | Admitting: Otolaryngology

## 2019-11-14 DIAGNOSIS — H9042 Sensorineural hearing loss, unilateral, left ear, with unrestricted hearing on the contralateral side: Secondary | ICD-10-CM

## 2019-11-14 DIAGNOSIS — H9192 Unspecified hearing loss, left ear: Secondary | ICD-10-CM | POA: Diagnosis not present

## 2019-11-14 MED ORDER — GADOBENATE DIMEGLUMINE 529 MG/ML IV SOLN
20.0000 mL | Freq: Once | INTRAVENOUS | Status: AC | PRN
  Administered 2019-11-14: 20 mL via INTRAVENOUS

## 2019-11-18 NOTE — Telephone Encounter (Signed)
Spoke with [employer],information needs to be sent to his pulmonary physician.

## 2019-11-23 ENCOUNTER — Encounter: Payer: Self-pay | Admitting: Cardiology

## 2019-12-10 ENCOUNTER — Encounter: Payer: Self-pay | Admitting: Family Medicine

## 2019-12-12 ENCOUNTER — Telehealth: Payer: Self-pay | Admitting: Family Medicine

## 2019-12-12 NOTE — Telephone Encounter (Signed)
MEDICATION: Eliquis 5 MG, Tessalon Perles 100 MG, Cardura 2 MG, Cardizem 120 MG, Cozaar 50 MG, metoprolol succinate 100 MG, pravachol 40 MG, Gabapentin 300 MG (considering to stop taking it)   PHARMACY: [facility] on [address]  Comments: Pt would like to stop taking PROTONIX 40 MG. Pt would like for nurse to call back and speak with him.    **Let patient know to contact pharmacy at the end of the day to make sure medication is ready. **  ** Please notify patient to allow 48-72 hours to process**  **Encourage patient to contact the pharmacy for refills or they can request refills through [facility]**

## 2019-12-15 ENCOUNTER — Other Ambulatory Visit: Payer: Self-pay

## 2019-12-15 MED ORDER — DILTIAZEM HCL ER COATED BEADS 120 MG PO CP24: 120.0000 mg | ORAL_CAPSULE | Freq: Every day | ORAL | 3 refills | Status: DC

## 2019-12-15 MED ORDER — BENZONATATE 100 MG PO CAPS: 200.0000 mg | ORAL_CAPSULE | Freq: Two times a day (BID) | ORAL | 0 refills | Status: DC | PRN

## 2019-12-15 MED ORDER — METOPROLOL SUCCINATE ER 100 MG PO TB24: 100.0000 mg | ORAL_TABLET | Freq: Every day | ORAL | 3 refills | Status: DC

## 2019-12-15 MED ORDER — PRAVASTATIN SODIUM 40 MG PO TABS: 40.0000 mg | ORAL_TABLET | Freq: Every day | ORAL | 3 refills | Status: DC

## 2019-12-15 MED ORDER — APIXABAN 5 MG PO TABS: 5.0000 mg | ORAL_TABLET | Freq: Two times a day (BID) | ORAL | 3 refills | Status: DC

## 2019-12-15 MED ORDER — DOXAZOSIN MESYLATE 2 MG PO TABS: 2.0000 mg | ORAL_TABLET | Freq: Every day | ORAL | 2 refills | Status: DC

## 2019-12-15 MED ORDER — LOSARTAN POTASSIUM 50 MG PO TABS: 50.0000 mg | ORAL_TABLET | Freq: Every day | ORAL | 1 refills | Status: DC

## 2019-12-15 MED ORDER — GABAPENTIN 300 MG PO CAPS: ORAL_CAPSULE | ORAL | 2 refills | Status: DC

## 2019-12-15 NOTE — Telephone Encounter (Signed)
Rx sent 

## 2019-12-30 ENCOUNTER — Telehealth: Payer: Self-pay | Admitting: Pulmonary Disease

## 2019-12-30 ENCOUNTER — Ambulatory Visit: Payer: Medicare HMO | Admitting: Internal Medicine

## 2019-12-30 NOTE — Telephone Encounter (Signed)
Called and spoke with pt. Pt is requesting to have an order sent to [facility] to receive a new CPAP machine as his is over 73 years old.  Dr. Elsworth Soho, please advise if you are okay with this being done prior to pt's OV as he has not been seen since 03/2018.

## 2019-12-31 NOTE — Telephone Encounter (Signed)
Needs office visit with APP first

## 2019-12-31 NOTE — Telephone Encounter (Signed)
Spoke with the pt  He is aware of response per Dr Elsworth Soho  He is already scheduled for 01/14/2020  He prefers to keep this appt rather than coming in sooner to see APP  Nothing further needed

## 2020-01-14 ENCOUNTER — Telehealth: Payer: Self-pay | Admitting: Cardiology

## 2020-01-14 ENCOUNTER — Ambulatory Visit: Payer: Medicare HMO | Admitting: Pulmonary Disease

## 2020-01-14 ENCOUNTER — Other Ambulatory Visit: Payer: Self-pay

## 2020-01-14 ENCOUNTER — Encounter: Payer: Self-pay | Admitting: Pulmonary Disease

## 2020-01-14 DIAGNOSIS — I4891 Unspecified atrial fibrillation: Secondary | ICD-10-CM

## 2020-01-14 DIAGNOSIS — G4733 Obstructive sleep apnea (adult) (pediatric): Secondary | ICD-10-CM | POA: Diagnosis not present

## 2020-01-14 NOTE — Telephone Encounter (Signed)
Pt notified of Dr Judeth Cornfield message he will let his Urologist know and to restart ASAP. Will forward to Dr Matilde Sprang.

## 2020-01-14 NOTE — Telephone Encounter (Signed)
New Message  A gross hemotoria and Eliquis may be the calprit from urology. Check with dr. to get off Eliquis for a couple of day

## 2020-01-14 NOTE — Assessment & Plan Note (Signed)
Prescription to DME for CPAP 11 cm with a AirFit F30 full facemask He is compliant and this is certainly helped improve his daytime somnolence and fatigue Benefits for atrial fibrillation are also discussed  Weight loss encouraged, compliance with goal of at least 4-6 hrs every night is the expectation. Advised against medications with sedative side effects Cautioned against driving when sleepy - understanding that sleepiness will vary on a day to day basis

## 2020-01-14 NOTE — Assessment & Plan Note (Signed)
Stop Eliquis due to hematuria, he has an appointment with urology tomorrow, can restart once cleared by them

## 2020-01-14 NOTE — Patient Instructions (Signed)
  Prescription to DME for CPAP 11 cm with a AirFit F30 full facemask  Stop taking Eliquis until cleared by urology

## 2020-01-14 NOTE — Progress Notes (Signed)
   Subjective:    Patient ID: Gordon Phillips, male    DOB: Apr 15, 1946, 74 y.o.   MRN: [id number]  HPI  74 yo  hypertensive for follow-up of OSA. PMH -paroxysmal atrial fibrillation on Eliquis status post ablation 04/2019 2 y FU  He was hospitalized 09/2019 for dysphagia, EGD suggested eosinophilic esophagitis, symptoms have completely resolved. He remains on Eliquis and has developed hematuria for 1 day, has a call out to his urologist and cardiologist.  He would like to get a new machine, no problems with mask or pressure. He feels like he is sleeping well, he is worried about his wife, sleeps in a different room. Download report was reviewed which shows excellent control of events 11 cm with moderate leak Excellent compliance more than 7 hours per night  Significant tests/ events reviewed   04/2014 Mod- AHI 28/h-CPAP 11 cm  Review of Systems Patient denies significant dyspnea,cough, hemoptysis,  chest pain, palpitations, pedal edema, orthopnea, paroxysmal nocturnal dyspnea, lightheadedness, nausea, vomiting, abdominal or  leg pains      Objective:   Physical Exam   Gen. Pleasant, obese, in no distress ENT - no lesions, no post nasal drip Neck: No JVD, no thyromegaly, no carotid bruits Lungs: no use of accessory muscles, no dullness to percussion, decreased without rales or rhonchi  Cardiovascular: Rhythm regular, heart sounds  normal, no murmurs or gallops, no peripheral edema Musculoskeletal: No deformities, no cyanosis or clubbing , no tremors Skin-vitiligo     Assessment & Plan:

## 2020-01-14 NOTE — Telephone Encounter (Signed)
Ok to hold eliquis for hematuria until cleared by urology to restart. Thanks.

## 2020-01-14 NOTE — Telephone Encounter (Signed)
S/w pt he states that he is on day 3 with hematuria and he called the nurse at [facility] urology and Dr MacDiarmid's office ans the nurse told him to call here to see if he could stop the Eliquis because the hematuria is continuing. He states that he just called on the phone today and has an appointment tomorrow to discus this an plan for the issues that he is having. Please advise on Eliquis

## 2020-01-20 ENCOUNTER — Ambulatory Visit: Payer: Medicare HMO | Admitting: Cardiology

## 2020-01-28 ENCOUNTER — Ambulatory Visit: Payer: Medicare HMO | Admitting: Internal Medicine

## 2020-01-30 ENCOUNTER — Other Ambulatory Visit: Payer: Self-pay

## 2020-02-02 ENCOUNTER — Other Ambulatory Visit: Payer: Self-pay

## 2020-02-02 ENCOUNTER — Ambulatory Visit: Payer: Medicare HMO | Admitting: Internal Medicine

## 2020-02-02 ENCOUNTER — Encounter: Payer: Self-pay | Admitting: Internal Medicine

## 2020-02-02 VITALS — BP 108/60 | HR 72 | Ht 74.0 in | Wt 266.0 lb

## 2020-02-02 DIAGNOSIS — Z8639 Personal history of other endocrine, nutritional and metabolic disease: Secondary | ICD-10-CM

## 2020-02-02 DIAGNOSIS — R7303 Prediabetes: Secondary | ICD-10-CM

## 2020-02-02 LAB — POCT GLYCOSYLATED HEMOGLOBIN (HGB A1C): Hemoglobin A1C: 5.7 % — AB (ref 4.0–5.6)

## 2020-02-02 NOTE — Patient Instructions (Signed)
Please check sugars once a day.  Please let me know if the sugars are consistently <70 or >160.  Please return in 6 months with your sugar log.

## 2020-02-02 NOTE — Addendum Note (Signed)
Addended by: Cardell Peach I on: 02/02/2020 03:58 PM   Modules accepted: Orders

## 2020-02-02 NOTE — Progress Notes (Signed)
Patient ID: Gordon Phillips, male   DOB: [birth year], 74 y.o.   MRN: [id number]   This visit occurred during the SARS-CoV-2 public health emergency.  Safety protocols were in place, including screening questions prior to the visit, additional usage of staff PPE, and extensive cleaning of exam room while observing appropriate contact time as indicated for disinfecting solutions.   HPI: Gordon Phillips is a 74 y.o.-year-old male, referred by his PCP, Dr. [name], for management of prediabetes, dx in ~2013.  His wife, Stanly Mcnelis, is also my patient. Last visit 5 months ago.  Reviewed history: Patient has a history of impaired glucose tolerance/prediabetes, but he was found to have an HbA1c in the diabetic range at last check in 08/2019.   He tells me that he knows why his HbA1c was higher: he was working part-time, but stopped working at the start of the coronavirus pandemic and started to eat more.   After the HbA1c returned high >> he started to change his diet: reduced bread, stopped sweets, increased fruit and veggies. He also tried to restart exercise but hurt his back so he is not taking it slowly.  After the HbA1c returned higher, he was started on Metformin 500 mg daily by PCP but he could not tolerate it due to GI symptoms >> abdominal pain and diarrhea.  He would like to avoid medications if possible.  Reviewed HbA1c levels: Lab Results  Component Value Date   HGBA1C 6.5 08/26/2019   HGBA1C 6.1 (H) 02/03/2013   HGBA1C 6.2 05/27/2012   He is not on any medication of his prediabetes.  At last visit with was not checking sugars at home, now checking 0-1x a day: - am: 80-90s - 2h after b'fast: n/c - lunch: n/c - 2h after lunch: n/c - dinner: n/c - 2h after dinner: n/c - bedtime: n/c It is unclear at which level he has hypoglycemia awareness.  Glucometer: One Touch verio  Pt's meals are: - Breakfast: Oatmeal + fruit (banana + grapes) 3x a week; eggs + grits + bacon/sausage; tuna  sandwich or tuna salad; eggs + sardines + toast; granola bar - Lunch: skips or peanuts or PB crackers; [employer] - Dinner:  Salmon or pork chop + veggies + rice or potatoes - Snacks: stopped icecream, juice (he was drinking a lot of this before), grapes, cookies  -+ Mild CKD, last BUN/creatinine:  Lab Results  Component Value Date   BUN 11 10/04/2019   BUN 16 10/03/2019   CREATININE 1.01 10/04/2019   CREATININE 1.10 10/03/2019  On Cozaar.  -+ HL; last set of lipids: Lab Results  Component Value Date   CHOL 145 08/26/2019   HDL 40.40 08/26/2019   LDLCALC 91 08/26/2019   LDLDIRECT 139.2 11/21/2012   TRIG 68.0 08/26/2019   CHOLHDL 4 08/26/2019  On pravastatin 40.  - last eye exam was in 2020: no DR  - no numbness and tingling in his feet.  Pt has no FH of DM.  He has a history of stroke (left basal ganglia infarct) in 01/2013 -previously on Eliquis, now off.  He also has MGUS, vitiligo, h/o pericarditis.  He also has a history of Graves ds., s/p RAI tx in the 30s.    Reviewed latest TSH levels: Lab Results  Component Value Date   TSH 0.773 09/29/2019   TSH 0.67 08/26/2019   TSH 1.442 08/12/2018   TSH 0.57 09/18/2017   TSH 0.65 03/20/2016   He is on Toprol-XL.  ROS: Constitutional: no weight  gain/no weight loss, + fatigue, no subjective hyperthermia, no subjective hypothermia Eyes: no blurry vision, no xerophthalmia ENT: no sore throat, no nodules palpated in neck, no dysphagia, no odynophagia, no hoarseness Cardiovascular: no CP/no SOB/no palpitations/no leg swelling Respiratory: no cough/no SOB/no wheezing Gastrointestinal: no N/no V/no D/no C/no acid reflux Musculoskeletal: no muscle aches/+ joint aches Skin: no rashes, no hair loss Neurological: no tremors/no numbness/no tingling/no dizziness, + HAs  I reviewed pt's medications, allergies, PMH, social hx, family hx, and changes were documented in the history of present illness. Otherwise, unchanged from my  initial visit note.  Past Medical History:  Diagnosis Date  . ALLERGIC RHINITIS 01/25/2010  . Arthritis    "neck, back, shoulders," (02/20/2017)  . Chronic lower back pain   . High cholesterol   . Hx of cardiovascular stress test    a. Lexiscan Myoview 02/06/13:  EF 59%, no ischemia or scar  . Hx of echocardiogram    a.  Echo 02/03/13:  Mild LVH, mild focal basal septal hypertrophy, EF 50-55%, Gr 1 DD, mild LAE  . HYPERTENSION 01/25/2010  . HYPERTHYROIDISM 01/25/2010   S/P radioactive iodine "back in the 1980s" (02/20/2017)  . Impaired glucose tolerance   . 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 Obesity   . OSA on CPAP    Uses CPAP nightly  . Pericarditis 1980's  . Stroke [facility])    Old L basal ganglia infarct by CT 01/2013.  Marland Kitchen Vitiligo    Past Surgical History:  Procedure Laterality Date  . A-FLUTTER ABLATION N/A 04/23/2019   Procedure: A-FLUTTER ABLATION;  Surgeon: Deboraha Sprang, MD;  Location: [place] [facility];  Service: Cardiovascular;  Laterality: N/A;  . BIOPSY  09/29/2019   Procedure: BIOPSY;  Surgeon: Thornton Park, MD;  Location: [place];  Service: Gastroenterology;;  . CARDIOVERSION N/A 08/14/2018   Procedure: CARDIOVERSION;  Surgeon: [name], MD;  Location: [facility] ENDOSCOPY;  Service: Cardiovascular;  Laterality: N/A;  . ESOPHAGOGASTRODUODENOSCOPY N/A 09/29/2019   Procedure: ESOPHAGOGASTRODUODENOSCOPY (EGD);  Surgeon: Thornton Park, MD;  Location: [place];  Service: Gastroenterology;  Laterality: N/A;  . INGUINAL HERNIA REPAIR Bilateral   . LUMBAR LAMINECTOMY    . TEE WITHOUT CARDIOVERSION N/A 08/14/2018   Procedure: TRANSESOPHAGEAL ECHOCARDIOGRAM (TEE);  Surgeon: [name], MD;  Location: [facility] ENDOSCOPY;  Service: Cardiovascular;  Laterality: N/A;  . UMBILICAL HERNIA REPAIR     Social History   Socioeconomic History  . Marital status: Married    Spouse name: Not on file  . Number of children: 2  .  Years of education: Not on file  . Highest education level: Not on file  Occupational History  . Occupation: Press photographer  Tobacco Use  . Smoking status: Former Smoker    Packs/day: 0.50    Years: 8.00    Pack years: 4.00    Types: Cigarettes    Quit date: 1968    Years since quitting: 53.2  . Smokeless tobacco: Never Used  Substance and Sexual Activity  . Alcohol use: Yes    Comment: 02/20/2017 "couple drinks/month on average"  . Drug use: No  . Sexual activity: Yes  Other Topics Concern  . Not on file  Social History Narrative  . Not on file   Social Determinants of Health   Financial Resource Strain:   . Difficulty of Paying Living Expenses:   Food Insecurity:   . Worried About Charity fundraiser in the Last Year:   .  Ran Out of Food in the Last Year:   Transportation Needs:   . Film/video editor (Medical):   Marland Kitchen Lack of Transportation (Non-Medical):   Physical Activity:   . Days of Exercise per Week:   . Minutes of Exercise per Session:   Stress:   . Feeling of Stress :   Social Connections:   . Frequency of Communication with Friends and Family:   . Frequency of Social Gatherings with Friends and Family:   . Attends Religious Services:   . Active Member of Clubs or Organizations:   . Attends Archivist Meetings:   Marland Kitchen Marital Status:   Intimate Partner Violence:   . Fear of Current or Ex-Partner:   . Emotionally Abused:   Marland Kitchen Physically Abused:   . Sexually Abused:    Current Outpatient Medications on File Prior to Visit  Medication Sig Dispense Refill  . acetaminophen-codeine (TYLENOL #3) 300-30 MG tablet Take 1 tablet by mouth every 6 (six) hours as needed for moderate pain. 10 tablet 0  . Aloe-Sodium Chloride (AYR SALINE NASAL GEL NA) Place 1 application into the nose at bedtime.    Marland Kitchen apixaban (ELIQUIS) 5 MG TABS tablet Take 1 tablet (5 mg total) by mouth 2 (two) times daily. 180 tablet 3  . AYR SALINE NASAL DROPS NA Place 2 sprays into both nostrils  at bedtime.     . benzonatate (TESSALON PERLES) 100 MG capsule Take 2 capsules (200 mg total) by mouth 2 (two) times daily as needed for cough. 30 capsule 0  . diltiazem (CARDIZEM CD) 120 MG 24 hr capsule Take 1 capsule (120 mg total) by mouth daily. 90 capsule 3  . doxazosin (CARDURA) 2 MG tablet Take 1 tablet (2 mg total) by mouth at bedtime. 90 tablet 2  . gabapentin (NEURONTIN) 300 MG capsule Take 1 capsule (300 mg total) by mouth every morning AND 2 capsules (600 mg total) at bedtime. 90 capsule 2  . glucose blood test strip Use to check blood sugar once a day 100 each 12  . hydrocortisone cream 1 % Apply 1 application topically 2 (two) times daily as needed for itching.    . losartan (COZAAR) 50 MG tablet Take 1 tablet (50 mg total) by mouth daily. 90 tablet 1  . metoprolol succinate (TOPROL-XL) 100 MG 24 hr tablet Take 1 tablet (100 mg total) by mouth at bedtime. Take with or immediately following a meal. 90 tablet 3  . OVER THE COUNTER MEDICATION Take 1 tablet by mouth 2 (two) times daily. OTC - Super Beta Prostate    . pravastatin (PRAVACHOL) 40 MG tablet Take 1 tablet (40 mg total) by mouth at bedtime. 90 tablet 3  . PRESCRIPTION MEDICATION Inhale into the lungs at bedtime. CPAP    . sildenafil (VIAGRA) 100 MG tablet Take 100 mg by mouth daily as needed for erectile dysfunction.     . vitamin C (ASCORBIC ACID) 500 MG tablet Take 1,000 mg by mouth every morning.     No current facility-administered medications on file prior to visit.   Allergies  Allergen Reactions  . Flomax [Tamsulosin Hcl] Itching  . Relpax [Eletriptan] Other (See Comments) and Hypertension    Chest pain and sweating  . Other Palpitations    Any steroids cause heart palpitations  (tolerates low doses)   Family History  Problem Relation Age of Onset  . Stroke Father        Passed away in his 51s  . Thyroid  disease Sister        goiter  . Sudden death Brother        ? Drug use, no autopsy  . Throat cancer  Brother   . Colon cancer Neg Hx   . Esophageal cancer Neg Hx   . Rectal cancer Neg Hx   . Stomach cancer Neg Hx    PE: BP 108/60   Pulse 72   Ht 6\' 2"  (1.88 m)   Wt 266 lb (120.7 kg)   SpO2 96%   BMI 34.15 kg/m  Wt Readings from Last 3 Encounters:  02/02/20 266 lb (120.7 kg)  01/14/20 265 lb 12.8 oz (120.6 kg)  10/24/19 264 lb 6.4 oz (119.9 kg)   Constitutional: overweight, in NAD Eyes: PERRLA, EOMI, no exophthalmos ENT: moist mucous membranes, no thyromegaly, no cervical lymphadenopathy Cardiovascular: RRR, No MRG Respiratory: CTA B Gastrointestinal: abdomen soft, NT, ND, BS+ Musculoskeletal: no deformities, strength intact in all 4 Skin: moist, warm, no rashes Neurological: no tremor with outstretched hands, DTR normal in all 4  ASSESSMENT: 1.  Prediabetes, with new HbA1c in the diabetic range  2.  History of Graves' disease  PLAN:  1. Patient with history of prediabetes but an HbA1c in the low diabetic range at last check, before last visit.  His PCP tried to start him on Metformin but he could not tolerate even a low dose.  When I saw him at last visit, he was started that he was aware why his sugars increase.  He stopped working at the beginning of the coronavirus pandemic and relaxed his diet.  Since then, after his HbA1c returned at 6.5%, he started to change his diet and eliminate sweets.  He also decreased his bread intake.  Most importantly, he eliminated juice, of which she was drinking a lot of in the past.  He wanted to avoid medications at that time and I agreed that he first needed to change his diet and then decide about medications if still needed.  I made specific suggestions about further improvement of his diet. -At this visit, he is checking sugars sporadically (not in the last week), but when he is checking they are at goal.  He continued to improve his diet after last visit but in the last 2 weeks he relaxed it and had more fatty foods.  Despite this, today:  HbA1c: 5.7% (much better!) -I congratulated him and advised him to return to the previous diet but as of now, no medication is needed.  He does need to check his sugars daily or at least every other day, rotating check times. - I suggested to:  Patient Instructions  Please check sugars once a day.  Please let me know if the sugars are consistently <70 or >160.  Please return in 6 months with your sugar log.   - advised to check sugars at different times of the day - 1x a day, rotating check times - advised for yearly eye exams >> he is UTD - return to clinic in 6 months   2. H/o Graves' disease, -Status post RAI treatment in the 80s -Review latest TFTs remain were normal in 09/2019 -No further investigation needed for this but needs at least a TSH checked every year -we will check this at next visit  Philemon Kingdom, MD PhD  [facility] End[facility]

## 2020-02-09 ENCOUNTER — Telehealth: Payer: Self-pay | Admitting: Family Medicine

## 2020-02-09 NOTE — Telephone Encounter (Signed)
Please advise. Thanks.  

## 2020-02-09 NOTE — Telephone Encounter (Signed)
LVM for patient to return call. 

## 2020-02-09 NOTE — Telephone Encounter (Signed)
Recommend that he decrease to 300mg  twice daily for a week, then 300mg  nightly for a week, then 300mg  every other night for a week, then stop.

## 2020-02-09 NOTE — Telephone Encounter (Signed)
Pt called asking for a medical assistant to give him a call. Pt is wanting to come off of Gabapentin and would like to see if that is okay and how he can come off of it. Please advise.

## 2020-02-10 ENCOUNTER — Telehealth: Payer: Self-pay | Admitting: Hematology

## 2020-02-10 NOTE — Telephone Encounter (Signed)
Called pt per 4/5 sch message - unable to reach pt . Left message for pt to call back if they still needed a reschedule.

## 2020-02-11 NOTE — Telephone Encounter (Signed)
Patient notified and voices understanding 

## 2020-02-16 ENCOUNTER — Other Ambulatory Visit: Payer: Medicare HMO

## 2020-02-24 ENCOUNTER — Telehealth: Payer: Self-pay | Admitting: Cardiology

## 2020-02-24 NOTE — Telephone Encounter (Signed)
Returned call to patient no answer.LMTC. 

## 2020-02-24 NOTE — Telephone Encounter (Signed)
Left message to call office

## 2020-02-24 NOTE — Telephone Encounter (Signed)
New Message    Pt c/o medication issue:  1. Name of Medication: apixaban (ELIQUIS) 5 MG TABS tablet  2. How are you currently taking this medication (dosage and times per day)? 5 mg 2 x daily   3. Are you having a reaction (difficulty breathing--STAT)? No   4. What is your medication issue? Pt is experiencing blood in his urine    Please call

## 2020-02-24 NOTE — Telephone Encounter (Signed)
Follow Up  Pt was returning phone call.  Please call

## 2020-02-25 ENCOUNTER — Other Ambulatory Visit: Payer: Self-pay

## 2020-02-25 ENCOUNTER — Inpatient Hospital Stay: Payer: Medicare HMO | Attending: Hematology

## 2020-02-25 DIAGNOSIS — D472 Monoclonal gammopathy: Secondary | ICD-10-CM | POA: Diagnosis not present

## 2020-02-25 LAB — CMP (CANCER CENTER ONLY)
ALT: 14 U/L (ref 0–44)
AST: 20 U/L (ref 15–41)
Albumin: 3.8 g/dL (ref 3.5–5.0)
Alkaline Phosphatase: 60 U/L (ref 38–126)
Anion gap: 9 (ref 5–15)
BUN: 14 mg/dL (ref 8–23)
CO2: 26 mmol/L (ref 22–32)
Calcium: 9.2 mg/dL (ref 8.9–10.3)
Chloride: 108 mmol/L (ref 98–111)
Creatinine: 1.04 mg/dL (ref 0.61–1.24)
GFR, Est AFR Am: >60 mL/min (ref >60)
GFR, Estimated: >60 mL/min (ref >60)
Glucose, Bld: 103 mg/dL — ABNORMAL HIGH (ref 70–99)
Potassium: 3.9 mmol/L (ref 3.5–5.1)
Sodium: 143 mmol/L (ref 135–145)
Total Bilirubin: 0.7 mg/dL (ref 0.3–1.2)
Total Protein: 7.4 g/dL (ref 6.5–8.1)

## 2020-02-25 LAB — CBC WITH DIFFERENTIAL (CANCER CENTER ONLY)
Abs Immature Granulocytes: 0.01 10*3/uL (ref 0.00–0.07)
Basophils Absolute: 0 10*3/uL (ref 0.0–0.1)
Basophils Relative: 0 %
Eosinophils Absolute: 0.1 10*3/uL (ref 0.0–0.5)
Eosinophils Relative: 2 %
HCT: 39.5 % (ref 39.0–52.0)
Hemoglobin: 12.8 g/dL — ABNORMAL LOW (ref 13.0–17.0)
Immature Granulocytes: 0 %
Lymphocytes Relative: 35 %
Lymphs Abs: 1.8 10*3/uL (ref 0.7–4.0)
MCH: 30.2 pg (ref 26.0–34.0)
MCHC: 32.4 g/dL (ref 30.0–36.0)
MCV: 93.2 fL (ref 80.0–100.0)
Monocytes Absolute: 0.4 10*3/uL (ref 0.1–1.0)
Monocytes Relative: 8 %
Neutro Abs: 2.9 10*3/uL (ref 1.7–7.7)
Neutrophils Relative %: 55 %
Platelet Count: 171 10*3/uL (ref 150–400)
RBC: 4.24 MIL/uL (ref 4.22–5.81)
RDW: 13.5 % (ref 11.5–15.5)
WBC Count: 5.2 10*3/uL (ref 4.0–10.5)
nRBC: 0 % (ref 0.0–0.2)

## 2020-02-26 ENCOUNTER — Telehealth: Payer: Self-pay | Admitting: Pulmonary Disease

## 2020-02-26 DIAGNOSIS — G4733 Obstructive sleep apnea (adult) (pediatric): Secondary | ICD-10-CM

## 2020-02-26 LAB — KAPPA/LAMBDA LIGHT CHAINS
Kappa free light chain: 15 mg/L (ref 3.3–19.4)
Kappa, lambda light chain ratio: 1.56 (ref 0.26–1.65)
Lambda free light chains: 9.6 mg/L (ref 5.7–26.3)

## 2020-02-26 NOTE — Telephone Encounter (Signed)
Follow up  Pt is returning call regarding his bleeding issue. He said he will be available today and will wait for call back

## 2020-02-26 NOTE — Telephone Encounter (Signed)
Spoke with patient. He stated that he was following up on the order for the nasal pillow mask that was supposed to be sent to [facility] last month. I advised him that I did see that RA had approved an order to [facility] but it was never placed. Apologized to patient and advised that I would send an order today. He verbalized understanding.   Nothing further needed at time of call.

## 2020-02-26 NOTE — Telephone Encounter (Signed)
Spoke with patient. Patient reports he has had continued issues with gross hematuria while on Eliquis. 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 He recently had another episode two days ago but this morning symptoms have resolved and he has resumed his Eliquis. Patient calling in to see if he can decrease to 1 tablet daily until his appointment on 4/28 or what suggestion Dr. Harrell Gave has. He reports in the meantime if his urine is still clear tonight he will take the second dosage and continue on the prescribed regimen.

## 2020-02-27 ENCOUNTER — Telehealth: Payer: Self-pay | Admitting: Pulmonary Disease

## 2020-02-27 LAB — MULTIPLE MYELOMA PANEL, SERUM
Albumin SerPl Elph-Mcnc: 3.5 g/dL (ref 2.9–4.4)
Albumin/Glob SerPl: 1.1 (ref 0.7–1.7)
Alpha 1: 0.2 g/dL (ref 0.0–0.4)
Alpha2 Glob SerPl Elph-Mcnc: 0.9 g/dL (ref 0.4–1.0)
B-Globulin SerPl Elph-Mcnc: 1 g/dL (ref 0.7–1.3)
Gamma Glob SerPl Elph-Mcnc: 1.2 g/dL (ref 0.4–1.8)
Globulin, Total: 3.3 g/dL (ref 2.2–3.9)
IgA: 112 mg/dL (ref 61–437)
IgG (Immunoglobin G), Serum: 1197 mg/dL (ref 603–1613)
IgM (Immunoglobulin M), Srm: 61 mg/dL (ref 15–143)
M Protein SerPl Elph-Mcnc: 0.4 g/dL — ABNORMAL HIGH
Total Protein ELP: 6.8 g/dL (ref 6.0–8.5)

## 2020-02-27 NOTE — Telephone Encounter (Signed)
Follow Up:    Pt was retuning a call from today(02-27-20)he does not know who called.

## 2020-02-27 NOTE — Telephone Encounter (Signed)
The problem with decreasing the dose is that there is no stroke protection--so a decreased dose increases bleeding without providing stroke protection. It's like all or nothing for stroke protection and dosing. I think the urology plan is reasonable, and when the risk of bleeding is high, we do hold blood thinners for a time. We will discuss more at his visit. Thanks.

## 2020-02-27 NOTE — Telephone Encounter (Signed)
Spoke with patient. Informed him of Dr. Judeth Cornfield response. Patient verbalized understanding.

## 2020-02-27 NOTE — Telephone Encounter (Signed)
Spoke with patient. He was calling to follow up on the request for a cpap mask from a few days. I advised him that the order had been placed, signed and sent to [facility]. He stated that he had called [facility] to go ahead and get a mask but they needed more information. He could not remember the name of the mask that was recommended by RA. I provided him with the name of the mask RA mentioned during his office and advised him that this was not a nasal pillow setup. He verbalized understanding and wanted me to call [facility] to speak with Olivia Mackie to let her know. I advised him that I would.   Called [facility] and spoke with [place]. She stated that Olivia Mackie had left for the day but she would take a message for her. Provided her with the the patient's information and mask information.   Nothing further needed at time of call.

## 2020-03-03 ENCOUNTER — Other Ambulatory Visit: Payer: Self-pay

## 2020-03-03 ENCOUNTER — Encounter: Payer: Self-pay | Admitting: Cardiology

## 2020-03-03 ENCOUNTER — Ambulatory Visit (INDEPENDENT_AMBULATORY_CARE_PROVIDER_SITE_OTHER): Payer: Medicare HMO | Admitting: Cardiology

## 2020-03-03 VITALS — BP 126/62 | HR 68 | Temp 97.9°F | Ht 74.0 in | Wt 264.0 lb

## 2020-03-03 DIAGNOSIS — Z9989 Dependence on other enabling machines and devices: Secondary | ICD-10-CM

## 2020-03-03 DIAGNOSIS — I480 Paroxysmal atrial fibrillation: Secondary | ICD-10-CM

## 2020-03-03 DIAGNOSIS — Z7189 Other specified counseling: Secondary | ICD-10-CM

## 2020-03-03 DIAGNOSIS — G4733 Obstructive sleep apnea (adult) (pediatric): Secondary | ICD-10-CM

## 2020-03-03 DIAGNOSIS — I10 Essential (primary) hypertension: Secondary | ICD-10-CM | POA: Diagnosis not present

## 2020-03-03 DIAGNOSIS — R319 Hematuria, unspecified: Secondary | ICD-10-CM | POA: Diagnosis not present

## 2020-03-03 DIAGNOSIS — I4892 Unspecified atrial flutter: Secondary | ICD-10-CM | POA: Diagnosis not present

## 2020-03-03 NOTE — Patient Instructions (Signed)

## 2020-03-03 NOTE — Progress Notes (Signed)
Cardiology Office Note:    Date:  03/03/2020   ID:  Gordon Phillips, DOB 07/09/1946, MRN [id number]  PCP:  Gordon Barrack, MD  Cardiologist:  Gordon Dresser, MD PhD EP: Dr. Caryl Phillips  Referring MD: Gordon Barrack, MD   CC: follow up  History of Present Illness:    Gordon Phillips is a 74 y.o. male with a hx of atrial flutter, MGUS, back Phillips who is seen for follow up of his atrial flutter.  Cardiac history: 08/12/18 presented with new onset atrial flutter (initially called as a CODE STEMI). During his hospitalization, he underwent TEE/CV with conversion to NSR. He was seen for EP follow up post hospitalization to discuss flutter ablation on 09/17/18, but he was found to be in rate controlled atrial fibrillation at the time. Dofetilide load vs. Ablation discussed. After seeing Dr. Caryl Phillips /2020, plan was for ablation of the flutter, but this was put on hold due to coronavirus. He underwent flutter ablation 04/23/19 by Dr. Caryl Phillips.  Today: Has had intermittent hematuria. Has seen urology, been told he may have fragile blood vessels leading to bleeding. Has intermittently had to stop apixaban. Now on finasteride.  No afib symptoms. Regular rhythm on exam today.  We discussed risk of stroke vs. Risk of bleeding today. His strategy of intermittently holding apixaban has been working, and he is hopeful the finasteride will help. He would like to remain on anticoagulation as long as hematuria is intermittent and manageable.  Denies chest Phillips, shortness of breath at rest or with normal exertion. No PND, orthopnea, LE edema or unexpected weight gain. No syncope or palpitations.  Past Medical History:  Diagnosis Date  . ALLERGIC RHINITIS 01/25/2010  . Arthritis    "neck, back, shoulders," (02/20/2017)  . Chronic lower back Phillips   . High cholesterol   . Hx of cardiovascular stress test    a. Lexiscan Myoview 02/06/13:  EF 59%, no ischemia or scar  . Hx of echocardiogram    a.  Echo 02/03/13:  Mild  LVH, mild focal basal septal hypertrophy, EF 50-55%, Gr 1 DD, mild LAE  . HYPERTENSION 01/25/2010  . HYPERTHYROIDISM 01/25/2010   S/P radioactive iodine "back in the 1980s" (02/20/2017)  . Impaired glucose tolerance   . 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 Obesity   . OSA on CPAP    Uses CPAP nightly  . Pericarditis 1980's  . 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  Marland Kitchen Vitiligo     Past Surgical History:  Procedure Laterality Date  . A-FLUTTER ABLATION N/A 04/23/2019   Procedure: A-FLUTTER ABLATION;  Surgeon: Gordon Sprang, MD;  Location: [facility] [place] [facility];  Service: Cardiovascular;  Laterality: N/A;  . BIOPSY  09/29/2019   Procedure: BIOPSY;  Surgeon: Gordon Park, MD;  Location: [place];  Service: Gastroenterology;;  . CARDIOVERSION N/A 08/14/2018   Procedure: CARDIOVERSION;  Surgeon: Gordon Pain, MD;  Location: [facility] ENDOSCOPY;  Service: Cardiovascular;  Laterality: N/A;  . ESOPHAGOGASTRODUODENOSCOPY N/A 09/29/2019   Procedure: ESOPHAGOGASTRODUODENOSCOPY (EGD);  Surgeon: Gordon Park, MD;  Location: [place];  Service: Gastroenterology;  Laterality: N/A;  . INGUINAL HERNIA REPAIR Bilateral   . LUMBAR LAMINECTOMY    . TEE WITHOUT CARDIOVERSION N/A 08/14/2018   Procedure: TRANSESOPHAGEAL ECHOCARDIOGRAM (TEE);  Surgeon: Gordon Pain, MD;  Location: [facility] ENDOSCOPY;  Service: Cardiovascular;  Laterality: N/A;  . UMBILICAL HERNIA REPAIR      Current  Medications: Current Outpatient Medications on File Prior to Visit  Medication Sig  . acetaminophen-codeine (TYLENOL #3) 300-30 MG tablet Take 1 tablet by mouth every 6 (six) hours as needed for moderate Phillips.  . Aloe-Sodium Chloride (AYR SALINE NASAL GEL NA) Place 1 application into the nose at bedtime.  Marland Kitchen apixaban (ELIQUIS) 5 MG TABS tablet Take 1 tablet (5 mg total) by mouth 2 (two) times daily.  Skipper Cliche SALINE NASAL DROPS NA Place 2 sprays into both nostrils  at bedtime.   . benzonatate (TESSALON PERLES) 100 MG capsule Take 2 capsules (200 mg total) by mouth 2 (two) times daily as needed for cough.  . diltiazem (CARDIZEM CD) 120 MG 24 hr capsule Take 1 capsule (120 mg total) by mouth daily.  Marland Kitchen doxazosin (CARDURA) 2 MG tablet Take 1 tablet (2 mg total) by mouth at bedtime.  Marland Kitchen glucose blood test strip Use to check blood sugar once a day  . hydrocortisone cream 1 % Apply 1 application topically 2 (two) times daily as needed for itching.  . losartan (COZAAR) 50 MG tablet Take 1 tablet (50 mg total) by mouth daily.  . metoprolol succinate (TOPROL-XL) 100 MG 24 hr tablet Take 1 tablet (100 mg total) by mouth at bedtime. Take with or immediately following a meal.  . OVER THE COUNTER MEDICATION Take 1 tablet by mouth 2 (two) times daily. OTC - Super Beta Prostate  . pravastatin (PRAVACHOL) 40 MG tablet Take 1 tablet (40 mg total) by mouth at bedtime.  Marland Kitchen PRESCRIPTION MEDICATION Inhale into the lungs at bedtime. CPAP  . sildenafil (VIAGRA) 100 MG tablet Take 100 mg by mouth daily as needed for erectile dysfunction.   . vitamin C (ASCORBIC ACID) 500 MG tablet Take 1,000 mg by mouth every morning.  . gabapentin (NEURONTIN) 300 MG capsule Take 1 capsule (300 mg total) by mouth every morning AND 2 capsules (600 mg total) at bedtime.   No current facility-administered medications on file prior to visit.     Allergies:   Flomax [tamsulosin hcl], Relpax [eletriptan], and Other   Social History   Tobacco Use  . Smoking status: Former Smoker    Packs/day: 0.50    Years: 8.00    Pack years: 4.00    Types: Cigarettes    Quit date: 1968    Years since quitting: 53.3  . Smokeless tobacco: Never Used  Substance Use Topics  . Alcohol use: Yes    Comment: 02/20/2017 "couple drinks/month on average"  . Drug use: No    Family History: The patient's family history includes Stroke in his father; Sudden death in his brother; Throat cancer in his brother; Thyroid  disease in his sister. There is no history of Colon cancer, Esophageal cancer, Rectal cancer, or Stomach cancer.  ROS:   Please see the history of present illness.  Additional pertinent ROS negative except as noted.   EKGs/Labs/Other Studies Reviewed:    The following studies were reviewed today:  TEE 08/14/18 Study Conclusions - Left ventricle: The cavity size was normal. Wall thickness was   normal. Systolic function was mildly to moderately reduced. The   estimated ejection fraction was in the range of 40% to 45%. - Left atrium: The atrium was dilated. No evidence of thrombus in   the appendage. - Right ventricle: The cavity size was dilated. Wall thickness was   normal. - Right atrium: The atrium was dilated. No evidence of thrombus in   the atrial cavity or appendage. -  Atrial septum: No defect or patent foramen ovale was identified.   Echo contrast study showed no right-to-left atrial level shunt,   following an increase in RA pressure induced by provocative   maneuvers. - Superior vena cava: The study excluded a thrombus.  Echo 08/13/18 - Left ventricle: The cavity size was normal. Wall thickness was   increased in a pattern of mild LVH. There was mild focal basal   hypertrophy of the septum. Systolic function was normal. The   estimated ejection fraction was in the range of 55% to 60%. Wall   motion was normal; there were no regional wall motion   abnormalities. The study is not technically sufficient to allow   evaluation of LV diastolic function. - Left atrium: The atrium was moderately dilated. - Pulmonary arteries: PA peak pressure: 34 mm Hg (S).  Impressions - Normal LV function; mild LVH; moderate LAE; cannot R/O left   atrial mass; suggest TEE to further assess.  Lexiscan MPI 02/2017  Nuclear stress EF: 50%.  There was no ST segment deviation noted during stress.  The study is normal.  This is a low risk study.  The left ventricular ejection fraction  is mildly decreased (45-54%).  EKG:  EKG is personally reviewed.  The ekg ordered 09/28/19  is afib RVR  Recent Labs: 09/29/2019: TSH 0.773 10/04/2019: Magnesium 2.1 02/25/2020: ALT 14; BUN 14; Creatinine 1.04; Hemoglobin 12.8; Platelet Count 171; Potassium 3.9; Sodium 143  Recent Lipid Panel    Component Value Date/Time   CHOL 145 08/26/2019 1424   TRIG 68.0 08/26/2019 1424   HDL 40.40 08/26/2019 1424   CHOLHDL 4 08/26/2019 1424   VLDL 13.6 08/26/2019 1424   LDLCALC 91 08/26/2019 1424   LDLDIRECT 139.2 11/21/2012 1122    Physical Exam:    VS:  BP 126/62 (BP Location: Left Arm, Patient Position: Sitting, Cuff Size: Large)   Pulse 68   Temp 97.9 F (36.6 C)   Ht 6\' 2"  (1.88 m)   Wt 264 lb (119.7 kg)   BMI 33.90 kg/m     Wt Readings from Last 3 Encounters:  03/03/20 264 lb (119.7 kg)  02/02/20 266 lb (120.7 kg)  01/14/20 265 lb 12.8 oz (120.6 kg)    GEN: Well nourished, well developed in no acute distress HEENT: Normal, moist mucous membranes NECK: No JVD CARDIAC: regular rhythm, normal S1 and S2, no rubs or gallops. No murmur. VASCULAR: Radial and DP pulses 2+ bilaterally. No carotid bruits RESPIRATORY:  Clear to auscultation without rales, wheezing or rhonchi  ABDOMEN: Soft, non-tender, non-distended MUSCULOSKELETAL:  Ambulates independently SKIN: Warm and dry, no edema. Vitiligo NEUROLOGIC:  Alert and oriented x 3. No focal neuro deficits noted. PSYCHIATRIC:  Normal affect   ASSESSMENT:    1. Paroxysmal atrial fibrillation (HCC)   2. Atrial flutter, unspecified type ([place])   3. Essential hypertension   4. Hematuria, unspecified type   5. OSA on CPAP   6. Cardiac risk counseling   7. Counseling on health promotion and disease prevention    PLAN:    atrial flutter, with paroxysmal atrial fibrillation: s/p flutter ablation. Had acute episode of afib RVR while in ER for dysphagia, converted with cardizem bolus. No further events. -has had intermittent hematuria  requiring brief cessations of anticoagulation -we discussed stroke vs. Bleeding risk today. He would like to continue apixaban for now and will hold with hematuria as he has been doing -continue apixaban. Chadsvasc= at least 75 (old stroke seen on CT,  age, hypertension) -on dual nodal agents with good control, as his atrial fibrillation goes very rapidly. Continue metoprolol and diltiazem -continue use of CPAP to treat OSA  Hypertension: at goal today -continue losartan -currently on diltiazem and metoprolol, see above  CV risk counseling and Secondary prevention counseling: -recommend heart healthy/Mediterranean diet, with whole grains, fruits, vegetable, fish, lean meats, nuts, and olive oil. Limit salt. -recommend moderate walking, 3-5 times/week for 30-50 minutes each session. Aim for at least 150 minutes.week. Goal should be pace of 3 miles/hours, or walking 1.5 miles in 30 minutes -recommend avoidance of tobacco products. Avoid excess alcohol. -Additional risk factor control:  -Diabetes risk: denies diagosis, per [facility] A1c 5.7  -Lipids: LDL goal <70, most recent 91 on pravastatin. Had been <70 prior, will work on diet/exercise  -Blood pressure control: as above  -Weight: BMI ~34, we have discussed lifestyle.   Plan for follow up: 6 mos or sooner PRN  Medication Adjustments/Labs and Tests Ordered: Current medicines are reviewed at length with the patient today.  Concerns regarding medicines are outlined above.  No orders of the defined types were placed in this encounter.  No orders of the defined types were placed in this encounter.   Patient Instructions  Medication Instructions:  Your Physician recommend you continue on your current medication as directed.    *If you need a refill on your cardiac medications before your next appointment, please call your pharmacy*   Lab Work: None  Testing/Procedures: None   Follow-Up: At [facility], you and your health needs are  our priority.  As part of our continuing mission to provide you with exceptional heart care, we have created designated Provider Care Teams.  These Care Teams include your primary Cardiologist (physician) and Advanced Practice Providers (APPs -  Physician Assistants and Nurse Practitioners) who all work together to provide you with the care you need, when you need it.  We recommend signing up for the patient portal called "MyChart".  Sign up information is provided on this After Visit Summary.  MyChart is used to connect with patients for Virtual Visits (Telemedicine).  Patients are able to view lab/test results, encounter notes, upcoming appointments, etc.  Non-urgent messages can be sent to your provider as well.   To learn more about what you can do with MyChart, go to [email or web address].    Your next appointment:   6 month(s)  The format for your next appointment:   In Person  Provider:   Buford Dresser, MD       Signed, Gordon Dresser, MD PhD 03/03/2020  [place]

## 2020-03-10 ENCOUNTER — Telehealth: Payer: Self-pay

## 2020-03-10 NOTE — Telephone Encounter (Signed)
Mr Yildiz left vm requesting lab results.  I returned his call and left vm

## 2020-03-11 NOTE — Telephone Encounter (Signed)
Gordon Phillips called back.  I let him know his labs were WNL and apologized for the delay.  I reviewed with him his appt schedule.  He verbalized understanding.

## 2020-03-15 ENCOUNTER — Other Ambulatory Visit: Payer: Self-pay | Admitting: Family Medicine

## 2020-04-11 ENCOUNTER — Encounter: Payer: Self-pay | Admitting: Cardiology

## 2020-07-14 ENCOUNTER — Telehealth: Payer: Self-pay | Admitting: Hematology

## 2020-07-14 NOTE — Telephone Encounter (Signed)
Rescheduled appts with YF to [facility] on 10/11. YF on PAL. Unable to reach pt. Left voicemail with appt time and date.

## 2020-07-29 ENCOUNTER — Ambulatory Visit (INDEPENDENT_AMBULATORY_CARE_PROVIDER_SITE_OTHER): Payer: Medicare HMO

## 2020-07-29 DIAGNOSIS — Z0000 Encounter for general adult medical examination without abnormal findings: Secondary | ICD-10-CM

## 2020-07-29 NOTE — Progress Notes (Signed)
Virtual Visit via Telephone Note  I connected with  Gordon Phillips on 07/29/20 at 11:45 AM EDT by telephone and verified that I am speaking with the correct person using two identifiers.  Medicare Annual Wellness visit completed telephonically due to Covid-19 pandemic.   Persons participating in this call: This Health Coach and this patient.   Location: Patient: Home Provider: Office   I discussed the limitations, risks, security and privacy concerns of performing an evaluation and management service by telephone and the availability of in person appointments. The patient expressed understanding and agreed to proceed.  Unable to perform video visit due to video visit attempted and failed and/or patient does not have video capability.   Some vital signs may be absent or patient reported.   Willette Brace, LPN    Subjective:   Gordon Phillips is a 74 y.o. male who presents for Medicare Annual/Subsequent preventive examination.  Review of Systems     Cardiac Risk Factors include: male gender;hypertension;dyslipidemia;obesity (BMI >30kg/m2)     Objective:    There were no vitals filed for this visit. There is no height or weight on file to calculate BMI.  Advanced Directives 07/29/2020 09/28/2019 09/26/2019 08/13/2019 07/03/2019 04/23/2019 08/13/2018  Does Patient Have a Medical Advance Directive? No No No No No No No  Does patient want to make changes to medical advance directive? Yes (MAU/Ambulatory/Procedural Areas - Information given) - - - - - -  Would patient like information on creating a medical advance directive? - - No - Patient declined No - Patient declined Yes (MAU/Ambulatory/Procedural Areas - Information given) - No - Patient declined  Pre-existing out of facility DNR order (yellow form or pink MOST form) - - - - - - -    Current Medications (verified) Outpatient Encounter Medications as of 07/29/2020  Medication Sig  . diltiazem (CARDIZEM CD) 120 MG 24 hr capsule Take  1 capsule (120 mg total) by mouth daily.  . finasteride (PROSCAR) 5 MG tablet Take 5 mg by mouth daily.  . hydrocortisone cream 1 % Apply 1 application topically 2 (two) times daily as needed for itching.  . losartan (COZAAR) 50 MG tablet Take 1 tablet (50 mg total) by mouth daily.  . metoprolol succinate (TOPROL-XL) 100 MG 24 hr tablet Take 1 tablet (100 mg total) by mouth at bedtime. Take with or immediately following a meal.  . OVER THE COUNTER MEDICATION Take 1 tablet by mouth 2 (two) times daily. OTC - Super Beta Prostate  . pravastatin (PRAVACHOL) 40 MG tablet Take 1 tablet (40 mg total) by mouth at bedtime.  Marland Kitchen PRESCRIPTION MEDICATION Inhale into the lungs at bedtime. CPAP  . sildenafil (VIAGRA) 100 MG tablet Take 100 mg by mouth daily as needed for erectile dysfunction.   . vitamin C (ASCORBIC ACID) 500 MG tablet Take 1,000 mg by mouth every morning.  Marland Kitchen acetaminophen-codeine (TYLENOL #3) 300-30 MG tablet Take 1 tablet by mouth every 6 (six) hours as needed for moderate pain. (Patient not taking: Reported on 07/29/2020)  . Aloe-Sodium Chloride (AYR SALINE NASAL GEL NA) Place 1 application into the nose at bedtime. (Patient not taking: Reported on 07/29/2020)  . apixaban (ELIQUIS) 5 MG TABS tablet Take 1 tablet (5 mg total) by mouth 2 (two) times daily.  Skipper Cliche SALINE NASAL DROPS NA Place 2 sprays into both nostrils at bedtime.  (Patient not taking: Reported on 07/29/2020)  . benzonatate (TESSALON PERLES) 100 MG capsule Take 2 capsules (200 mg total) by mouth  2 (two) times daily as needed for cough. (Patient not taking: Reported on 07/29/2020)  . doxazosin (CARDURA) 2 MG tablet Take 1 tablet (2 mg total) by mouth at bedtime.  . [DISCONTINUED] gabapentin (NEURONTIN) 300 MG capsule TAKE 1 CAPSULE (300 MG TOTAL) BY MOUTH EVERY MORNING AND 2 CAPSULES (600 MG TOTAL) AT BEDTIME.  . [DISCONTINUED] glucose blood test strip Use to check blood sugar once a day (Patient not taking: Reported on 07/29/2020)    No facility-administered encounter medications on file as of 07/29/2020.    Allergies (verified) Flomax [tamsulosin hcl], Relpax [eletriptan], and Other   History: Past Medical History:  Diagnosis Date  . ALLERGIC RHINITIS 01/25/2010  . Arthritis    "neck, back, shoulders," (02/20/2017)  . Chronic lower back pain   . High cholesterol   . Hx of cardiovascular stress test    a. Lexiscan Myoview 02/06/13:  EF 59%, no ischemia or scar  . Hx of echocardiogram    a.  Echo 02/03/13:  Mild LVH, mild focal basal septal hypertrophy, EF 50-55%, Gr 1 DD, mild LAE  . HYPERTENSION 01/25/2010  . HYPERTHYROIDISM 01/25/2010   S/P radioactive iodine "back in the 1980s" (02/20/2017)  . Impaired glucose tolerance   . 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 Obesity   . OSA on CPAP    Uses CPAP nightly  . Pericarditis 1980's  . 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  Marland Kitchen Vitiligo    Past Surgical History:  Procedure Laterality Date  . A-FLUTTER ABLATION N/A 04/23/2019   Procedure: A-FLUTTER ABLATION;  Surgeon: Deboraha Sprang, MD;  Location: [facility];  Service: Cardiovascular;  Laterality: N/A;  . BIOPSY  09/29/2019   Procedure: BIOPSY;  Surgeon: Thornton Park, MD;  Location: [place];  Service: Gastroenterology;;  . CARDIOVERSION N/A 08/14/2018   Procedure: CARDIOVERSION;  Surgeon: [name], MD;  Location: [facility] ENDOSCOPY;  Service: Cardiovascular;  Laterality: N/A;  . ESOPHAGOGASTRODUODENOSCOPY N/A 09/29/2019   Procedure: ESOPHAGOGASTRODUODENOSCOPY (EGD);  Surgeon: Thornton Park, MD;  Location: [place];  Service: Gastroenterology;  Laterality: N/A;  . INGUINAL HERNIA REPAIR Bilateral   . LUMBAR LAMINECTOMY    . TEE WITHOUT CARDIOVERSION N/A 08/14/2018   Procedure: TRANSESOPHAGEAL ECHOCARDIOGRAM (TEE);  Surgeon: [name], MD;  Location: [facility] ENDOSCOPY;  Service: Cardiovascular;  Laterality: N/A;  . UMBILICAL  HERNIA REPAIR     Family History  Problem Relation Age of Onset  . Stroke Father        Passed away in his 53s  . Thyroid disease Sister        goiter  . Sudden death Brother        ? Drug use, no autopsy  . Throat cancer Brother   . Colon cancer Neg Hx   . Esophageal cancer Neg Hx   . Rectal cancer Neg Hx   . Stomach cancer Neg Hx    Social History   Socioeconomic History  . Marital status: Married    Spouse name: Not on file  . Number of children: 2  . Years of education: Not on file  . Highest education level: Not on file  Occupational History  . Occupation: Press photographer  . Occupation: retired  Tobacco Use  . Smoking status: Former Smoker    Packs/day: 0.50    Years: 8.00    Pack years: 4.00    Types: Cigarettes    Quit date:  1968    Years since quitting: 53.7  . Smokeless tobacco: Never Used  Vaping Use  . Vaping Use: Never used  Substance and Sexual Activity  . Alcohol use: Yes    Comment: 02/20/2017 "couple drinks/month on average"  . Drug use: No  . Sexual activity: Yes  Other Topics Concern  . Not on file  Social History Narrative  . Not on file   Social Determinants of Health   Financial Resource Strain: Low Risk   . Difficulty of Paying Living Expenses: Not hard at all  Food Insecurity: No Food Insecurity  . Worried About Charity fundraiser in the Last Year: Never true  . Ran Out of Food in the Last Year: Never true  Transportation Needs: No Transportation Needs  . Lack of Transportation (Medical): No  . Lack of Transportation (Non-Medical): No  Physical Activity: Insufficiently Active  . Days of Exercise per Week: 2 days  . Minutes of Exercise per Session: 30 min  Stress: No Stress Concern Present  . Feeling of Stress : Not at all  Social Connections: Moderately Isolated  . Frequency of Communication with Friends and Family: More than three times a week  . Frequency of Social Gatherings with Friends and Family: More than three times a week  .  Attends Religious Services: Never  . Active Member of Clubs or Organizations: No  . Attends Archivist Meetings: Never  . Marital Status: Married    Tobacco Counseling Counseling given: Not Answered   Clinical Intake:  Pre-visit preparation completed: Yes  Pain : No/denies pain     BMI - recorded: 33.4 Nutritional Status: BMI > 30  Obese Nutritional Risks: None Diabetes: No  How often do you need to have someone help you when you read instructions, pamphlets, or other written materials from your doctor or pharmacy?: 1 - Never  Diabetic?No  Interpreter Needed?: No  Information entered by :: Charlott Rakes, LPN   Activities of Daily Living In your present state of health, do you have any difficulty performing the following activities: 07/29/2020  Hearing? N  Vision? N  Difficulty concentrating or making decisions? N  Walking or climbing stairs? N  Dressing or bathing? N  Doing errands, shopping? N  Preparing Food and eating ? N  Using the Toilet? N  In the past six months, have you accidently leaked urine? Y  Comment prostate issues,  Do you have problems with loss of bowel control? N  Managing your Medications? N  Managing your Finances? N  Housekeeping or managing your Housekeeping? N  Some recent data might be hidden    Patient Care Team: Vivi Barrack, MD as PCP - General (Family Medicine) Buford Dresser, MD as PCP - Cardiology (Cardiology) Truitt Merle, MD as Consulting Physician (Hematology) [facility] as Consulting Physician (Ophthalmology)  Indicate any recent Medical Services you may have received from other than [facility] providers in the past year (date may be approximate).     Assessment:   This is a routine wellness examination for [facility].  Hearing/Vision screen  Hearing Screening   125Hz  250Hz  500Hz  1000Hz  2000Hz  3000Hz  4000Hz  6000Hz  8000Hz   Right ear:           Left ear:           Comments: Pt denies any  hearing issues at this time  Vision Screening Comments: Pt follows up annually with Dr Katy Fitch  Dietary issues and exercise activities discussed: Current Exercise Habits: The  patient does not participate in regular exercise at present  Goals    . DIET - REDUCE PORTION SIZE     Patient would like to change eating habits and decrease meal size at dinner    . Increase physical activity     Would like to gradually add exercise into daily routine     . Patient Stated     Lose weight      Depression Screen PHQ 2/9 Scores 07/29/2020 08/26/2019 08/20/2018 03/20/2016 08/10/2014  PHQ - 2 Score 0 0 0 0 0  PHQ- 9 Score - 3 - - -    Fall Risk Fall Risk  07/29/2020 08/26/2019 07/03/2019 08/20/2018 03/20/2016  Falls in the past year? 0 0 0 No No  Number falls in past yr: 0 - 0 - -  Injury with Fall? 0 - 0 - -  Risk for fall due to : Impaired vision - - - -  Follow up Falls prevention discussed - - - -    Any stairs in or around the home? Yes  If so, are there any without handrails? No  Home free of loose throw rugs in walkways, pet beds, electrical cords, etc? Yes  Adequate lighting in your home to reduce risk of falls? Yes   ASSISTIVE DEVICES UTILIZED TO PREVENT FALLS:  Life alert? No  Use of a cane, walker or w/c? No  Grab bars in the bathroom? Yes  Shower chair or bench in shower? Yes  Elevated toilet seat or a handicapped toilet? No   TIMED UP AND GO:  Was the test performed? No .   Cognitive Function:     6CIT Screen 07/29/2020 07/03/2019  What Year? 0 points 0 points  What month? 0 points 0 points  What time? - 0 points  Count back from 20 0 points 0 points  Months in reverse 0 points 0 points  Repeat phrase 2 points 0 points  Total Score - 0    Immunizations Immunization History  Administered Date(s) Administered  . Fluad Quad(high Dose 65+) 08/26/2019  . Influenza Split 08/10/2011, 08/21/2012  . Influenza, High Dose Seasonal PF 08/24/2017  . Influenza,inj,Quad  PF,6+ Mos 08/07/2013, 08/10/2014, 08/12/2018  . Influenza-Unspecified 06/20/2016  . Pneumococcal Conjugate-13 08/10/2014  . Pneumococcal Polysaccharide-23 03/20/2016  . Tdap 12/14/2014    TDAP status: Up to date Flu Vaccine status: Up to date Pneumococcal vaccine status: Up to date Covid-19 vaccine status: Completed vaccines pt stated  They will bring in card at next appt.  Qualifies for Shingles Vaccine? Yes   Zostavax completed No   Shingrix Completed?: No.    Education has been provided regarding the importance of this vaccine. Patient has been advised to call insurance company to determine out of pocket expense if they have not yet received this vaccine. Advised may also receive vaccine at local pharmacy or Health Dept. Verbalized acceptance and understanding.  Screening Tests Health Maintenance  Topic Date Due  . COVID-19 Vaccine (1) Never done  . INFLUENZA VACCINE  06/06/2020  . COLONOSCOPY  05/15/2023  . TETANUS/TDAP  12/14/2024  . Hepatitis C Screening  Completed  . PNA vac Low Risk Adult  Completed    Health Maintenance  Health Maintenance Due  Topic Date Due  . COVID-19 Vaccine (1) Never done  . INFLUENZA VACCINE  06/06/2020    Colorectal cancer screening: Completed 05/14/18. Repeat every 5 years    Additional Screening:  Hepatitis C Screening: Completed 12/14/14  Vision Screening: Recommended annual ophthalmology  exams for early detection of glaucoma and other disorders of the eye. Is the patient up to date with their annual eye exam?  Yes  Who is the provider or what is the name of the office in which the patient attends annual eye exams? Dr Katy Fitch   Dental Screening: Recommended annual dental exams for proper oral hygiene  Community Resource Referral / Chronic Care Management: CRR required this visit?  No   CCM required this visit?  No      Plan:     I have personally reviewed and noted the following in the patient's chart:   . Medical and social  history . Use of alcohol, tobacco or illicit drugs  . Current medications and supplements . Functional ability and status . Nutritional status . Physical activity . Advanced directives . List of other physicians . Hospitalizations, surgeries, and ER visits in previous 12 months . Vitals . Screenings to include cognitive, depression, and falls . Referrals and appointments  In addition, I have reviewed and discussed with patient certain preventive protocols, quality metrics, and best practice recommendations. A written personalized care plan for preventive services as well as general preventive health recommendations were provided to patient.     Willette Brace, LPN   7/47/1855   Nurse Notes: None

## 2020-07-29 NOTE — Patient Instructions (Signed)
Mr. Gordon Phillips , Thank you for taking time to come for your Medicare Wellness Visit. I appreciate your ongoing commitment to your health goals. Please review the following plan we discussed and let me know if I can assist you in the future.   Screening recommendations/referrals: Colonoscopy: Done 05/14/18 Recommended yearly ophthalmology/optometry visit for glaucoma screening and checkup Recommended yearly dental visit for hygiene and checkup  Vaccinations: Influenza vaccine: .utd Pneumococcal vaccine: Up to date Tdap vaccine: Up to date Shingles vaccine: Shingrix discussed. Please contact your pharmacy for coverage information.    Covid-19: Pt Stated he will bring card in next visit  Advanced directives: Please bring a copy of your health care power of attorney and living will to the office at your convenience.  Conditions/risks identified: Lose weight  Next appointment: Follow up in one year for your annual wellness visit.   Preventive Care 73 Years and Older, Male Preventive care refers to lifestyle choices and visits with your health care provider that can promote health and wellness. What does preventive care include?  A yearly physical exam. This is also called an annual well check.  Dental exams once or twice a year.  Routine eye exams. Ask your health care provider how often you should have your eyes checked.  Personal lifestyle choices, including:  Daily care of your teeth and gums.  Regular physical activity.  Eating a healthy diet.  Avoiding tobacco and drug use.  Limiting alcohol use.  Practicing safe sex.  Taking low doses of aspirin every day.  Taking vitamin and mineral supplements as recommended by your health care provider. What happens during an annual well check? The services and screenings done by your health care provider during your annual well check will depend on your age, overall health, lifestyle risk factors, and family history of  disease. Counseling  Your health care provider may ask you questions about your:  Alcohol use.  Tobacco use.  Drug use.  Emotional well-being.  Home and relationship well-being.  Sexual activity.  Eating habits.  History of falls.  Memory and ability to understand (cognition).  Work and work Statistician. Screening  You may have the following tests or measurements:  Height, weight, and BMI.  Blood pressure.  Lipid and cholesterol levels. These may be checked every 5 years, or more frequently if you are over 21 years old.  Skin check.  Lung cancer screening. You may have this screening every year starting at age 34 if you have a 30-pack-year history of smoking and currently smoke or have quit within the past 15 years.  Fecal occult blood test (FOBT) of the stool. You may have this test every year starting at age 65.  Flexible sigmoidoscopy or colonoscopy. You may have a sigmoidoscopy every 5 years or a colonoscopy every 10 years starting at age 9.  Prostate cancer screening. Recommendations will vary depending on your family history and other risks.  Hepatitis C blood test.  Hepatitis B blood test.  Sexually transmitted disease (STD) testing.  Diabetes screening. This is done by checking your blood sugar (glucose) after you have not eaten for a while (fasting). You may have this done every 1-3 years.  Abdominal aortic aneurysm (AAA) screening. You may need this if you are a current or former smoker.  Osteoporosis. You may be screened starting at age 14 if you are at high risk. Talk with your health care provider about your test results, treatment options, and if necessary, the need for more tests. Vaccines  Your health care provider may recommend certain vaccines, such as:  Influenza vaccine. This is recommended every year.  Tetanus, diphtheria, and acellular pertussis (Tdap, Td) vaccine. You may need a Td booster every 10 years.  Zoster vaccine. You may  need this after age 54.  Pneumococcal 13-valent conjugate (PCV13) vaccine. One dose is recommended after age 45.  Pneumococcal polysaccharide (PPSV23) vaccine. One dose is recommended after age 101. Talk to your health care provider about which screenings and vaccines you need and how often you need them. This information is not intended to replace advice given to you by your health care provider. Make sure you discuss any questions you have with your health care provider. Document Released: 11/19/2015 Document Revised: 07/12/2016 Document Reviewed: 08/24/2015 Elsevier Interactive Patient Education  2017 [place] Prevention in the Home Falls can cause injuries. They can happen to people of all ages. There are many things you can do to make your home safe and to help prevent falls. What can I do on the outside of my home?  Regularly fix the edges of walkways and driveways and fix any cracks.  Remove anything that might make you trip as you walk through a door, such as a raised step or threshold.  Trim any bushes or trees on the path to your home.  Use bright outdoor lighting.  Clear any walking paths of anything that might make someone trip, such as rocks or tools.  Regularly check to see if handrails are loose or broken. Make sure that both sides of any steps have handrails.  Any raised decks and porches should have guardrails on the edges.  Have any leaves, snow, or ice cleared regularly.  Use sand or salt on walking paths during winter.  Clean up any spills in your garage right away. This includes oil or grease spills. What can I do in the bathroom?  Use night lights.  Install grab bars by the toilet and in the tub and shower. Do not use towel bars as grab bars.  Use non-skid mats or decals in the tub or shower.  If you need to sit down in the shower, use a plastic, non-slip stool.  Keep the floor dry. Clean up any water that spills on the floor as soon as it  happens.  Remove soap buildup in the tub or shower regularly.  Attach bath mats securely with double-sided non-slip rug tape.  Do not have throw rugs and other things on the floor that can make you trip. What can I do in the bedroom?  Use night lights.  Make sure that you have a light by your bed that is easy to reach.  Do not use any sheets or blankets that are too big for your bed. They should not hang down onto the floor.  Have a firm chair that has side arms. You can use this for support while you get dressed.  Do not have throw rugs and other things on the floor that can make you trip. What can I do in the kitchen?  Clean up any spills right away.  Avoid walking on wet floors.  Keep items that you use a lot in easy-to-reach places.  If you need to reach something above you, use a strong step stool that has a grab bar.  Keep electrical cords out of the way.  Do not use floor polish or wax that makes floors slippery. If you must use wax, use non-skid floor wax.  Do  not have throw rugs and other things on the floor that can make you trip. What can I do with my stairs?  Do not leave any items on the stairs.  Make sure that there are handrails on both sides of the stairs and use them. Fix handrails that are broken or loose. Make sure that handrails are as long as the stairways.  Check any carpeting to make sure that it is firmly attached to the stairs. Fix any carpet that is loose or worn.  Avoid having throw rugs at the top or bottom of the stairs. If you do have throw rugs, attach them to the floor with carpet tape.  Make sure that you have a light switch at the top of the stairs and the bottom of the stairs. If you do not have them, ask someone to add them for you. What else can I do to help prevent falls?  Wear shoes that:  Do not have high heels.  Have rubber bottoms.  Are comfortable and fit you well.  Are closed at the toe. Do not wear sandals.  If you  use a stepladder:  Make sure that it is fully opened. Do not climb a closed stepladder.  Make sure that both sides of the stepladder are locked into place.  Ask someone to hold it for you, if possible.  Clearly mark and make sure that you can see:  Any grab bars or handrails.  First and last steps.  Where the edge of each step is.  Use tools that help you move around (mobility aids) if they are needed. These include:  Canes.  Walkers.  Scooters.  Crutches.  Turn on the lights when you go into a dark area. Replace any light bulbs as soon as they burn out.  Set up your furniture so you have a clear path. Avoid moving your furniture around.  If any of your floors are uneven, fix them.  If there are any pets around you, be aware of where they are.  Review your medicines with your doctor. Some medicines can make you feel dizzy. This can increase your chance of falling. Ask your doctor what other things that you can do to help prevent falls. This information is not intended to replace advice given to you by your health care provider. Make sure you discuss any questions you have with your health care provider. Document Released: 08/19/2009 Document Revised: 03/30/2016 Document Reviewed: 11/27/2014 Elsevier Interactive Patient Education  2017 [employer].

## 2020-08-05 ENCOUNTER — Ambulatory Visit: Payer: Medicare HMO | Admitting: Internal Medicine

## 2020-08-16 ENCOUNTER — Other Ambulatory Visit: Payer: Medicare HMO

## 2020-08-16 ENCOUNTER — Encounter: Payer: Self-pay | Admitting: Nurse Practitioner

## 2020-08-16 ENCOUNTER — Telehealth: Payer: Self-pay | Admitting: Nurse Practitioner

## 2020-08-16 ENCOUNTER — Ambulatory Visit: Payer: Medicare HMO | Admitting: Hematology

## 2020-08-16 ENCOUNTER — Inpatient Hospital Stay: Payer: Medicare HMO | Admitting: Nurse Practitioner

## 2020-08-16 ENCOUNTER — Inpatient Hospital Stay: Payer: Medicare HMO | Attending: Nurse Practitioner

## 2020-08-16 ENCOUNTER — Other Ambulatory Visit: Payer: Self-pay

## 2020-08-16 VITALS — BP 131/76 | HR 65 | Temp 96.7°F | Resp 18 | Ht 74.0 in | Wt 271.8 lb

## 2020-08-16 DIAGNOSIS — M549 Dorsalgia, unspecified: Secondary | ICD-10-CM | POA: Insufficient documentation

## 2020-08-16 DIAGNOSIS — D696 Thrombocytopenia, unspecified: Secondary | ICD-10-CM | POA: Insufficient documentation

## 2020-08-16 DIAGNOSIS — D472 Monoclonal gammopathy: Secondary | ICD-10-CM | POA: Diagnosis not present

## 2020-08-16 DIAGNOSIS — E7800 Pure hypercholesterolemia, unspecified: Secondary | ICD-10-CM | POA: Diagnosis not present

## 2020-08-16 DIAGNOSIS — I10 Essential (primary) hypertension: Secondary | ICD-10-CM | POA: Diagnosis not present

## 2020-08-16 DIAGNOSIS — E0500 Thyrotoxicosis with diffuse goiter without thyrotoxic crisis or storm: Secondary | ICD-10-CM | POA: Diagnosis not present

## 2020-08-16 DIAGNOSIS — L80 Vitiligo: Secondary | ICD-10-CM | POA: Insufficient documentation

## 2020-08-16 DIAGNOSIS — G8929 Other chronic pain: Secondary | ICD-10-CM | POA: Insufficient documentation

## 2020-08-16 LAB — CBC WITH DIFFERENTIAL (CANCER CENTER ONLY)
Abs Immature Granulocytes: 0.01 10*3/uL (ref 0.00–0.07)
Basophils Absolute: 0 10*3/uL (ref 0.0–0.1)
Basophils Relative: 0 %
Eosinophils Absolute: 0.1 10*3/uL (ref 0.0–0.5)
Eosinophils Relative: 2 %
HCT: 40.1 % (ref 39.0–52.0)
Hemoglobin: 13.1 g/dL (ref 13.0–17.0)
Immature Granulocytes: 0 %
Lymphocytes Relative: 37 %
Lymphs Abs: 1.8 10*3/uL (ref 0.7–4.0)
MCH: 29.6 pg (ref 26.0–34.0)
MCHC: 32.7 g/dL (ref 30.0–36.0)
MCV: 90.5 fL (ref 80.0–100.0)
Monocytes Absolute: 0.3 10*3/uL (ref 0.1–1.0)
Monocytes Relative: 7 %
Neutro Abs: 2.6 10*3/uL (ref 1.7–7.7)
Neutrophils Relative %: 54 %
Platelet Count: 142 10*3/uL — ABNORMAL LOW (ref 150–400)
RBC: 4.43 MIL/uL (ref 4.22–5.81)
RDW: 14.2 % (ref 11.5–15.5)
WBC Count: 4.9 10*3/uL (ref 4.0–10.5)
nRBC: 0 % (ref 0.0–0.2)

## 2020-08-16 LAB — CMP (CANCER CENTER ONLY)
ALT: 17 U/L (ref 0–44)
AST: 20 U/L (ref 15–41)
Albumin: 3.9 g/dL (ref 3.5–5.0)
Alkaline Phosphatase: 59 U/L (ref 38–126)
Anion gap: 4 — ABNORMAL LOW (ref 5–15)
BUN: 15 mg/dL (ref 8–23)
CO2: 29 mmol/L (ref 22–32)
Calcium: 9.7 mg/dL (ref 8.9–10.3)
Chloride: 106 mmol/L (ref 98–111)
Creatinine: 1.07 mg/dL (ref 0.61–1.24)
GFR, Estimated: >60 mL/min (ref >60)
Glucose, Bld: 116 mg/dL — ABNORMAL HIGH (ref 70–99)
Potassium: 4.2 mmol/L (ref 3.5–5.1)
Sodium: 139 mmol/L (ref 135–145)
Total Bilirubin: 0.8 mg/dL (ref 0.3–1.2)
Total Protein: 7.6 g/dL (ref 6.5–8.1)

## 2020-08-16 NOTE — Telephone Encounter (Signed)
Scheduled per los. Gave avs and calendar  

## 2020-08-16 NOTE — Progress Notes (Signed)
[place]   [phone]) [phone] [phone]   Clinic Follow up Note   Patient Care Team: Vivi Barrack, MD as PCP - General (Family Medicine) Buford Dresser, MD as PCP - Cardiology (Cardiology) Truitt Merle, MD as Consulting Physician (Hematology) [facility] as Consulting Physician (Ophthalmology) 08/16/2020  CHIEF COMPLAINT: Follow-up MGUS  CURRENT THERAPY: Observation  INTERVAL HISTORY: Gordon Phillips returns for annual follow-up as scheduled.  He was last seen by me on 08/18/2019.  He is doing fine overall.  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  He is getting 3-4 hours of sleep lately, usually gets 5.  Some of this is "prostate related." He continues to have chronic back pain, and tightness in the left hand middle finger, he is planning to have surgical release soon.  Managed by Dr. Grandville Silos.  Denies new pain.  His energy and appetite are stable.  Denies change in bowel habits.  No recent fever, chills, cough, chest pain, dyspnea, leg edema.    MEDICAL HISTORY:  Past Medical History:  Diagnosis Date  . ALLERGIC RHINITIS 01/25/2010  . Arthritis    "neck, back, shoulders," (02/20/2017)  . Chronic lower back pain   . High cholesterol   . Hx of cardiovascular stress test    a. Lexiscan Myoview 02/06/13:  EF 59%, no ischemia or scar  . Hx of echocardiogram    a.  Echo 02/03/13:  Mild LVH, mild focal basal septal hypertrophy, EF 50-55%, Gr 1 DD, mild LAE  . HYPERTENSION 01/25/2010  . HYPERTHYROIDISM 01/25/2010   S/P radioactive iodine "back in the 1980s" (02/20/2017)  . Impaired glucose tolerance   . 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 Obesity   . OSA on CPAP    Uses CPAP nightly  . Pericarditis 1980's  . 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  Marland Kitchen Vitiligo     SURGICAL HISTORY: Past  Surgical History:  Procedure Laterality Date  . A-FLUTTER ABLATION N/A 04/23/2019   Procedure: A-FLUTTER ABLATION;  Surgeon: Deboraha Sprang, MD;  Location: [place] CV LAB;  Service: Cardiovascular;  Laterality: N/A;  . BIOPSY  09/29/2019   Procedure: BIOPSY;  Surgeon: Thornton Park, MD;  Location: [place];  Service: Gastroenterology;;  . CARDIOVERSION N/A 08/14/2018   Procedure: CARDIOVERSION;  Surgeon: [name], MD;  Location: [facility] ENDOSCOPY;  Service: Cardiovascular;  Laterality: N/A;  . ESOPHAGOGASTRODUODENOSCOPY N/A 09/29/2019   Procedure: ESOPHAGOGASTRODUODENOSCOPY (EGD);  Surgeon: Thornton Park, MD;  Location: [place];  Service: Gastroenterology;  Laterality: N/A;  . INGUINAL HERNIA REPAIR Bilateral   . LUMBAR LAMINECTOMY    . TEE WITHOUT CARDIOVERSION N/A 08/14/2018   Procedure: TRANSESOPHAGEAL ECHOCARDIOGRAM (TEE);  Surgeon: [name], MD;  Location: [facility] ENDOSCOPY;  Service: Cardiovascular;  Laterality: N/A;  . UMBILICAL HERNIA REPAIR      I have reviewed the social history and family history with the patient and they are unchanged from previous note.  ALLERGIES:  is allergic to flomax [tamsulosin hcl], relpax [eletriptan], and other.  MEDICATIONS:  Current Outpatient Medications  Medication Sig Dispense Refill  . acetaminophen-codeine (TYLENOL #3) 300-30 MG tablet Take 1 tablet by mouth every 6 (six) hours as needed for moderate pain. (Patient not taking: Reported on 07/29/2020) 10 tablet 0  . Aloe-Sodium Chloride (AYR SALINE NASAL GEL NA) Place 1  application into the nose at bedtime. (Patient not taking: Reported on 07/29/2020)    . apixaban (ELIQUIS) 5 MG TABS tablet Take 1 tablet (5 mg total) by mouth 2 (two) times daily. 180 tablet 3  . AYR SALINE NASAL DROPS NA Place 2 sprays into both nostrils at bedtime.  (Patient not taking: Reported on 07/29/2020)    . benzonatate (TESSALON PERLES) 100 MG capsule Take 2 capsules (200 mg total) by mouth 2 (two)  times daily as needed for cough. (Patient not taking: Reported on 07/29/2020) 30 capsule 0  . diltiazem (CARDIZEM CD) 120 MG 24 hr capsule Take 1 capsule (120 mg total) by mouth daily. 90 capsule 3  . doxazosin (CARDURA) 2 MG tablet Take 1 tablet (2 mg total) by mouth at bedtime. 90 tablet 2  . finasteride (PROSCAR) 5 MG tablet Take 5 mg by mouth daily.    . hydrocortisone cream 1 % Apply 1 application topically 2 (two) times daily as needed for itching.    . losartan (COZAAR) 50 MG tablet Take 1 tablet (50 mg total) by mouth daily. 90 tablet 1  . metoprolol succinate (TOPROL-XL) 100 MG 24 hr tablet Take 1 tablet (100 mg total) by mouth at bedtime. Take with or immediately following a meal. 90 tablet 3  . OVER THE COUNTER MEDICATION Take 1 tablet by mouth 2 (two) times daily. OTC - Super Beta Prostate    . pravastatin (PRAVACHOL) 40 MG tablet Take 1 tablet (40 mg total) by mouth at bedtime. 90 tablet 3  . PRESCRIPTION MEDICATION Inhale into the lungs at bedtime. CPAP    . sildenafil (VIAGRA) 100 MG tablet Take 100 mg by mouth daily as needed for erectile dysfunction.     . vitamin C (ASCORBIC ACID) 500 MG tablet Take 1,000 mg by mouth every morning.     No current facility-administered medications for this visit.    PHYSICAL EXAMINATION:  Vitals:   08/16/20 1532  BP: 131/76  Pulse: 65  Resp: 18  Temp: (!) 96.7 F (35.9 C)  SpO2: 97%   Filed Weights   08/16/20 1532  Weight: 271 lb 12.8 oz (123.3 kg)    GENERAL:alert, no distress and comfortable SKIN: Vitiligo, no rash EYES: sclera clear LUNGS:  normal breathing effort HEART:  no lower extremity edema NEURO: alert & oriented x 3 with fluent speech, normal gait  LABORATORY DATA:  I have reviewed the data as listed CBC Latest Ref Rng & Units 08/16/2020 02/25/2020 10/04/2019  WBC 4.0 - 10.5 K/uL 4.9 5.2 8.1  Hemoglobin 13.0 - 17.0 g/dL 13.1 12.8(L) 13.8  Hematocrit 39 - 52 % 40.1 39.5 40.8  Platelets 150 - 400 K/uL 142(L) 171  122(L)     CMP Latest Ref Rng & Units 08/16/2020 02/25/2020 10/04/2019  Glucose 70 - 99 mg/dL 116(H) 103(H) 106(H)  BUN 8 - 23 mg/dL '15 14 11  ' Creatinine 0.61 - 1.24 mg/dL 1.07 1.04 1.01  Sodium 135 - 145 mmol/L 139 143 138  Potassium 3.5 - 5.1 mmol/L 4.2 3.9 3.9  Chloride 98 - 111 mmol/L 106 108 106  CO2 22 - 32 mmol/L '29 26 24  ' Calcium 8.9 - 10.3 mg/dL 9.7 9.2 8.7(L)  Total Protein 6.5 - 8.1 g/dL 7.6 7.4 -  Total Bilirubin 0.3 - 1.2 mg/dL 0.8 0.7 -  Alkaline Phos 38 - 126 U/L 59 60 -  AST 15 - 41 U/L 20 20 -  ALT 0 - 44 U/L 17 14 -  RADIOGRAPHIC STUDIES: I have personally reviewed the radiological images as listed and agreed with the findings in the report. No results found.   ASSESSMENT & PLAN: 74 y.o.African-American male, with past medical history of Graves' disease, vitiligo, hypertension, dyslipidemia, was incidentally found a small hypodense lesion in C5 on CT scan. Lab work reviewed a monoclonal paraprotein.  1. MGUS -His initial SPEP reveal a low-level of monoclonal paraprotein (0.27g/dl), immunoglobulin level were normal, no anemia, renal dysfunction, or hypercalcemia -His previous CT scan revealed a small hyper lytic lesion in the C5 vertebral body however was not obvious on the MRI scan. -A bone survey showed subtle lucencies in multiple bones, indeterminate.  -Bone marrow biopsy showed 5% plasma cells, suggestive of plasma cell dyscrasia. Cytogenetics was normal. -M protein has remained stable, light chains normal, stable MGUS  3. Mild thrombocytopenia -The platelet count has been in the range of 120 to 153 since 2016 -He does have other autoimmune disease including Graves' disease and vitiligo.  - His bone marrow biopsy showed abundant megakaryocytes, some of which display atypical features, the significance is uncertain. -stable  4. Chronic back pain  -His lumbar MRI in March 2016 showed severe degenerative changes, which is likely the cause of his  back pain. -His back pain worsened in 2020, images 08/13/19 shows moderate to severe neural foraminal stenosis -he plans to f/u with ortho   5. Hypertension, dyslipidemia -f/u PCP  6. Cancer screening  -per PCP -colonoscopy done 05/2018  -PSA normal on 03/26/19   Disposition: Mr. Kelemen appears stable.  His CBC remains stable with mild intermittent thrombocytopenia, no other cytopenias.  CMP is unremarkable, no renal dysfunction or hypercalcemia.  M protein has remained stable, normal light chains in the past.  His MGUS has been stable, labs are pending from today.   I encouraged him to try melatonin for sleep and cautioned him to avoid Benadryl due to his history of urinary retention.  Follow-up with PCP, Ortho, Endo, and other routine health care providers for chronic conditions.  We will continue monitoring labs every 6 months, follow-up in 1 year if MM panel from today remains stable.  All questions were answered. The patient knows to call the clinic with any problems, questions or concerns. No barriers to learning were detected.     Alla Feeling, NP 08/16/20

## 2020-08-17 LAB — KAPPA/LAMBDA LIGHT CHAINS
Kappa free light chain: 14.7 mg/L (ref 3.3–19.4)
Kappa, lambda light chain ratio: 2.19 — ABNORMAL HIGH (ref 0.26–1.65)
Lambda free light chains: 6.7 mg/L (ref 5.7–26.3)

## 2020-08-18 LAB — MULTIPLE MYELOMA PANEL, SERUM
Albumin SerPl Elph-Mcnc: 3.8 g/dL (ref 2.9–4.4)
Albumin/Glob SerPl: 1.2 (ref 0.7–1.7)
Alpha 1: 0.2 g/dL (ref 0.0–0.4)
Alpha2 Glob SerPl Elph-Mcnc: 0.9 g/dL (ref 0.4–1.0)
B-Globulin SerPl Elph-Mcnc: 0.9 g/dL (ref 0.7–1.3)
Gamma Glob SerPl Elph-Mcnc: 1.3 g/dL (ref 0.4–1.8)
Globulin, Total: 3.4 g/dL (ref 2.2–3.9)
IgA: 124 mg/dL (ref 61–437)
IgG (Immunoglobin G), Serum: 1311 mg/dL (ref 603–1613)
IgM (Immunoglobulin M), Srm: 76 mg/dL (ref 15–143)
M Protein SerPl Elph-Mcnc: 0.4 g/dL — ABNORMAL HIGH
Total Protein ELP: 7.2 g/dL (ref 6.0–8.5)

## 2020-08-26 ENCOUNTER — Other Ambulatory Visit: Payer: Self-pay | Admitting: Family Medicine

## 2020-09-01 ENCOUNTER — Telehealth: Payer: Self-pay

## 2020-09-01 NOTE — Telephone Encounter (Signed)
Gordon Phillips called for results of his lab test done in October.

## 2020-09-07 ENCOUNTER — Encounter: Payer: Self-pay | Admitting: Family Medicine

## 2020-09-07 ENCOUNTER — Ambulatory Visit (INDEPENDENT_AMBULATORY_CARE_PROVIDER_SITE_OTHER): Payer: Medicare HMO | Admitting: Family Medicine

## 2020-09-07 ENCOUNTER — Other Ambulatory Visit: Payer: Self-pay

## 2020-09-07 VITALS — BP 107/63 | HR 71 | Temp 97.6°F | Ht 74.0 in | Wt 273.0 lb

## 2020-09-07 DIAGNOSIS — I4891 Unspecified atrial fibrillation: Secondary | ICD-10-CM | POA: Diagnosis not present

## 2020-09-07 DIAGNOSIS — I10 Essential (primary) hypertension: Secondary | ICD-10-CM

## 2020-09-07 DIAGNOSIS — D472 Monoclonal gammopathy: Secondary | ICD-10-CM

## 2020-09-07 DIAGNOSIS — Z23 Encounter for immunization: Secondary | ICD-10-CM

## 2020-09-07 DIAGNOSIS — N401 Enlarged prostate with lower urinary tract symptoms: Secondary | ICD-10-CM

## 2020-09-07 DIAGNOSIS — Z0001 Encounter for general adult medical examination with abnormal findings: Secondary | ICD-10-CM

## 2020-09-07 DIAGNOSIS — R7303 Prediabetes: Secondary | ICD-10-CM

## 2020-09-07 DIAGNOSIS — E7800 Pure hypercholesterolemia, unspecified: Secondary | ICD-10-CM

## 2020-09-07 NOTE — Progress Notes (Signed)
Chief Complaint:  Gordon Phillips is a 74 y.o. male who presents today for his annual comprehensive physical exam.    Assessment/Plan:  Chronic Problems Addressed Today: BPH (benign prostatic hyperplasia) Follows with urology. On cardura 2mg  and finasteride 5mg  daily.   MGUS (monoclonal gammopathy of unknown significance) Stable.  Follows with oncology.  Atrial fibrillation with RVR (HCC) Regular rate and rhythm today.  He is anticoagulated on Eliquis and rate controlled with diltiazem and metoprolol.  Follows with cardiology.  Prediabetes Check A1c.  Essential hypertension At goal.  Continue antihypertensives with losartan 50 mg daily, diltiazem 120 mg daily, and metoprolol 100 mg daily.  HLD (hyperlipidemia) Check lipid panel.  Continue pravastatin 40 mg daily.   Preventative Healthcare: Flu vaccine given today.  Up-to-date on Covid vaccine.  Will be getting booster soon.  Due for next colonoscopy in 2024.  Had PSA checked through urology.  Discussed shingles vaccine and he will check with insurance.  Patient Counseling(The following topics were reviewed and/or handout was given):  -Nutrition: Stressed importance of moderation in sodium/caffeine intake, saturated fat and cholesterol, caloric balance, sufficient intake of fresh fruits, vegetables, and fiber.  -Stressed the importance of regular exercise.   -Substance Abuse: Discussed cessation/primary prevention of tobacco, alcohol, or other drug use; driving or other dangerous activities under the influence; availability of treatment for abuse.   -Injury prevention: Discussed safety belts, safety helmets, smoke detector, smoking near bedding or upholstery.   -Sexuality: Discussed sexually transmitted diseases, partner selection, use of condoms, avoidance of unintended pregnancy and contraceptive alternatives.   -Dental health: Discussed importance of regular tooth brushing, flossing, and dental visits.  -Health maintenance and  immunizations reviewed. Please refer to Health maintenance section.  Return to care in 1 year for next preventative visit.     Subjective:  HPI:  He has no acute complaints today.   Lifestyle Diet: Trying to get more fiber.  Exercise: Trying to be more active work. Will be trying elliptical.   Depression screen [facility] 2/9 07/29/2020  Decreased Interest 0  Down, Depressed, Hopeless 0  PHQ - 2 Score 0  Altered sleeping -  Tired, decreased energy -  Change in appetite -  Feeling bad or failure about yourself  -  Trouble concentrating -  Moving slowly or fidgety/restless -  Suicidal thoughts -  PHQ-9 Score -  Difficult doing work/chores -  Some recent data might be hidden    There are no preventive care reminders to display for this patient.   ROS: Per HPI, otherwise a complete review of systems was negative.   PMH:  The following were reviewed and entered/updated in epic: Past Medical History:  Diagnosis Date  . ALLERGIC RHINITIS 01/25/2010  . Arthritis    "neck, back, shoulders," (02/20/2017)  . Chronic lower back pain   . High cholesterol   . Hx of cardiovascular stress test    a. Lexiscan Myoview 02/06/13:  EF 59%, no ischemia or scar  . Hx of echocardiogram    a.  Echo 02/03/13:  Mild LVH, mild focal basal septal hypertrophy, EF 50-55%, Gr 1 DD, mild LAE  . HYPERTENSION 01/25/2010  . HYPERTHYROIDISM 01/25/2010   S/P radioactive iodine "back in the 1980s" (02/20/2017)  . Impaired glucose tolerance   . 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 Obesity   . OSA on CPAP    Uses CPAP nightly  . Pericarditis 1980's  . Stroke [facility])    Old  L basal ganglia infarct by CT 01/2013.  Marland Kitchen Vitiligo    Patient Active Problem List   Diagnosis Date Noted  . Facial pain 10/09/2019  . Dysphagia 09/29/2019  . Atrial fibrillation with RVR ([place]) 09/29/2019  . Prediabetes 09/19/2019  . H/O Graves' disease 09/19/2019  . Deviated septum 08/20/2018    . BPH (benign prostatic hyperplasia) 08/20/2018  . Atrial flutter ([place]) 08/12/2018  . HLD (hyperlipidemia) 02/20/2017  . History of CVA (cerebrovascular accident) 02/20/2017  . Back pain, chronic 11/22/2015  . MGUS (monoclonal gammopathy of unknown significance) 01/21/2015  . OSA (obstructive sleep apnea) 03/16/2014  . Essential hypertension 01/25/2010  . Allergic rhinitis 01/25/2010   Past Surgical History:  Procedure Laterality Date  . A-FLUTTER ABLATION N/A 04/23/2019   Procedure: A-FLUTTER ABLATION;  Surgeon: Deboraha Sprang, MD;  Location: [place] CV LAB;  Service: Cardiovascular;  Laterality: N/A;  . BIOPSY  09/29/2019   Procedure: BIOPSY;  Surgeon: Thornton Park, MD;  Location: [place];  Service: Gastroenterology;;  . CARDIOVERSION N/A 08/14/2018   Procedure: CARDIOVERSION;  Surgeon: [name], MD;  Location: [facility] ENDOSCOPY;  Service: Cardiovascular;  Laterality: N/A;  . ESOPHAGOGASTRODUODENOSCOPY N/A 09/29/2019   Procedure: ESOPHAGOGASTRODUODENOSCOPY (EGD);  Surgeon: Thornton Park, MD;  Location: [place];  Service: Gastroenterology;  Laterality: N/A;  . INGUINAL HERNIA REPAIR Bilateral   . LUMBAR LAMINECTOMY    . TEE WITHOUT CARDIOVERSION N/A 08/14/2018   Procedure: TRANSESOPHAGEAL ECHOCARDIOGRAM (TEE);  Surgeon: [name], MD;  Location: [facility] ENDOSCOPY;  Service: Cardiovascular;  Laterality: N/A;  . UMBILICAL HERNIA REPAIR      Family History  Problem Relation Age of Onset  . Stroke Father        Passed away in his 23s  . Thyroid disease Sister        goiter  . Sudden death Brother        ? Drug use, no autopsy  . Throat cancer Brother   . Colon cancer Neg Hx   . Esophageal cancer Neg Hx   . Rectal cancer Neg Hx   . Stomach cancer Neg Hx     Medications- reviewed and updated Current Outpatient Medications  Medication Sig Dispense Refill  . acetaminophen-codeine (TYLENOL #3) 300-30 MG tablet Take 1 tablet by mouth every 6 (six) hours as  needed for moderate pain. 10 tablet 0  . Aloe-Sodium Chloride (AYR SALINE NASAL GEL NA) Place 1 application into the nose at bedtime.     Marland Kitchen apixaban (ELIQUIS) 5 MG TABS tablet Take 1 tablet (5 mg total) by mouth 2 (two) times daily. 180 tablet 3  . diltiazem (CARDIZEM CD) 120 MG 24 hr capsule Take 1 capsule (120 mg total) by mouth daily. 90 capsule 3  . finasteride (PROSCAR) 5 MG tablet Take 5 mg by mouth daily.    . hydrocortisone cream 1 % Apply 1 application topically 2 (two) times daily as needed for itching.    . losartan (COZAAR) 50 MG tablet TAKE 1 TABLET BY MOUTH EVERY DAY 90 tablet 1  . metoprolol succinate (TOPROL-XL) 100 MG 24 hr tablet Take 1 tablet (100 mg total) by mouth at bedtime. Take with or immediately following a meal. 90 tablet 3  . OVER THE COUNTER MEDICATION Take 1 tablet by mouth 2 (two) times daily. OTC - Super Beta Prostate    . pravastatin (PRAVACHOL) 40 MG tablet Take 1 tablet (40 mg total) by mouth at bedtime. 90 tablet 3  . PRESCRIPTION MEDICATION Inhale into the  lungs at bedtime. CPAP    . sildenafil (VIAGRA) 100 MG tablet Take 100 mg by mouth daily as needed for erectile dysfunction.     . vitamin C (ASCORBIC ACID) 500 MG tablet Take 1,000 mg by mouth every morning.    Marland Kitchen doxazosin (CARDURA) 2 MG tablet Take 1 tablet (2 mg total) by mouth at bedtime. 90 tablet 2   No current facility-administered medications for this visit.    Allergies-reviewed and updated Allergies  Allergen Reactions  . Flomax [Tamsulosin Hcl] Itching  . Relpax [Eletriptan] Other (See Comments) and Hypertension    Chest pain and sweating  . Other Palpitations    Any steroids cause heart palpitations  (tolerates low doses)    Social History   Socioeconomic History  . Marital status: Married    Spouse name: Not on file  . Number of children: 2  . Years of education: Not on file  . Highest education level: Not on file  Occupational History  . Occupation: Press photographer  . Occupation:  retired  Tobacco Use  . Smoking status: Former Smoker    Packs/day: 0.50    Years: 8.00    Pack years: 4.00    Types: Cigarettes    Quit date: 1968    Years since quitting: 53.8  . Smokeless tobacco: Never Used  Vaping Use  . Vaping Use: Never used  Substance and Sexual Activity  . Alcohol use: Yes    Comment: 02/20/2017 "couple drinks/month on average"  . Drug use: No  . Sexual activity: Yes  Other Topics Concern  . Not on file  Social History Narrative  . Not on file   Social Determinants of Health   Financial Resource Strain: Low Risk   . Difficulty of Paying Living Expenses: Not hard at all  Food Insecurity: No Food Insecurity  . Worried About Charity fundraiser in the Last Year: Never true  . Ran Out of Food in the Last Year: Never true  Transportation Needs: No Transportation Needs  . Lack of Transportation (Medical): No  . Lack of Transportation (Non-Medical): No  Physical Activity: Insufficiently Active  . Days of Exercise per Week: 2 days  . Minutes of Exercise per Session: 30 min  Stress: No Stress Concern Present  . Feeling of Stress : Not at all  Social Connections: Moderately Isolated  . Frequency of Communication with Friends and Family: More than three times a week  . Frequency of Social Gatherings with Friends and Family: More than three times a week  . Attends Religious Services: Never  . Active Member of Clubs or Organizations: No  . Attends Archivist Meetings: Never  . Marital Status: Married        Objective:  Physical Exam: BP 107/63   Pulse 71   Temp 97.6 F (36.4 C) (Temporal)   Ht 6\' 2"  (1.88 m)   Wt 273 lb (123.8 kg)   SpO2 96%   BMI 35.05 kg/m   Body mass index is 35.05 kg/m. Wt Readings from Last 3 Encounters:  09/07/20 273 lb (123.8 kg)  08/16/20 271 lb 12.8 oz (123.3 kg)  03/03/20 264 lb (119.7 kg)   Gen: NAD, resting comfortably HEENT: TMs normal bilaterally. OP clear. No thyromegaly noted.  CV: RRR with no  murmurs appreciated Pulm: NWOB, CTAB with no crackles, wheezes, or rhonchi GI: Normal bowel sounds present. Soft, Nontender, Nondistended. MSK: no edema, cyanosis, or clubbing noted Skin: warm, dry Neuro: CN2-12 grossly intact. Strength 5/5  in upper and lower extremities. Reflexes symmetric and intact bilaterally.  Psych: Normal affect and thought content     Sakina Briones M. [name], MD 09/07/2020 2:29 PM

## 2020-09-07 NOTE — Assessment & Plan Note (Signed)
At goal.  Continue antihypertensives with losartan 50 mg daily, diltiazem 120 mg daily, and metoprolol 100 mg daily.

## 2020-09-07 NOTE — Assessment & Plan Note (Signed)
Check A1c. 

## 2020-09-07 NOTE — Assessment & Plan Note (Signed)
Stable.  Follows with oncology.

## 2020-09-07 NOTE — Assessment & Plan Note (Signed)
Regular rate and rhythm today.  He is anticoagulated on Eliquis and rate controlled with diltiazem and metoprolol.  Follows with cardiology.

## 2020-09-07 NOTE — Assessment & Plan Note (Signed)
Check lipid panel.  Continue pravastatin 40 mg daily. 

## 2020-09-07 NOTE — Assessment & Plan Note (Signed)
Follows with urology. On cardura 2mg  and finasteride 5mg  daily.

## 2020-09-07 NOTE — Patient Instructions (Signed)
It was very nice to see you today!  Keep up the good work!  We will check blood work and give you the flu vaccine.  Come back in 1 year or sooner as needed.   Take care, Dr [name]  Please try these tips to maintain a healthy lifestyle:   Eat at least 3 REAL meals and 1-2 snacks per day.  Aim for no more than 5 hours between eating.  If you eat breakfast, please do so within one hour of getting up.    Each meal should contain half fruits/vegetables, one quarter protein, and one quarter carbs (no bigger than a computer mouse)   Cut down on sweet beverages. This includes juice, soda, and sweet tea.     Drink at least 1 glass of water with each meal and aim for at least 8 glasses per day   Exercise at least 150 minutes every week.    Preventive Care 16 Years and Older, Male Preventive care refers to lifestyle choices and visits with your health care provider that can promote health and wellness. This includes:  A yearly physical exam. This is also called an annual well check.  Regular dental and eye exams.  Immunizations.  Screening for certain conditions.  Healthy lifestyle choices, such as diet and exercise. What can I expect for my preventive care visit? Physical exam Your health care provider will check:  Height and weight. These may be used to calculate body mass index (BMI), which is a measurement that tells if you are at a healthy weight.  Heart rate and blood pressure.  Your skin for abnormal spots. Counseling Your health care provider may ask you questions about:  Alcohol, tobacco, and drug use.  Emotional well-being.  Home and relationship well-being.  Sexual activity.  Eating habits.  History of falls.  Memory and ability to understand (cognition).  Work and work Statistician. What immunizations do I need?  Influenza (flu) vaccine  This is recommended every year. Tetanus, diphtheria, and pertussis (Tdap) vaccine  You may need a Td  booster every 10 years. Varicella (chickenpox) vaccine  You may need this vaccine if you have not already been vaccinated. Zoster (shingles) vaccine  You may need this after age 86. Pneumococcal conjugate (PCV13) vaccine  One dose is recommended after age 78. Pneumococcal polysaccharide (PPSV23) vaccine  One dose is recommended after age 5. Measles, mumps, and rubella (MMR) vaccine  You may need at least one dose of MMR if you were born in 1957 or later. You may also need a second dose. Meningococcal conjugate (MenACWY) vaccine  You may need this if you have certain conditions. Hepatitis A vaccine  You may need this if you have certain conditions or if you travel or work in places where you may be exposed to hepatitis A. Hepatitis B vaccine  You may need this if you have certain conditions or if you travel or work in places where you may be exposed to hepatitis B. Haemophilus influenzae type b (Hib) vaccine  You may need this if you have certain conditions. You may receive vaccines as individual doses or as more than one vaccine together in one shot (combination vaccines). Talk with your health care provider about the risks and benefits of combination vaccines. What tests do I need? Blood tests  Lipid and cholesterol levels. These may be checked every 5 years, or more frequently depending on your overall health.  Hepatitis C test.  Hepatitis B test. Screening  Lung cancer screening.  You may have this screening every year starting at age 75 if you have a 30-pack-year history of smoking and currently smoke or have quit within the past 15 years.  Colorectal cancer screening. All adults should have this screening starting at age 43 and continuing until age 49. Your health care provider may recommend screening at age 53 if you are at increased risk. You will have tests every 1-10 years, depending on your results and the type of screening test.  Prostate cancer screening.  Recommendations will vary depending on your family history and other risks.  Diabetes screening. This is done by checking your blood sugar (glucose) after you have not eaten for a while (fasting). You may have this done every 1-3 years.  Abdominal aortic aneurysm (AAA) screening. You may need this if you are a current or former smoker.  Sexually transmitted disease (STD) testing. Follow these instructions at home: Eating and drinking  Eat a diet that includes fresh fruits and vegetables, whole grains, lean protein, and low-fat dairy products. Limit your intake of foods with high amounts of sugar, saturated fats, and salt.  Take vitamin and mineral supplements as recommended by your health care provider.  Do not drink alcohol if your health care provider tells you not to drink.  If you drink alcohol: ? Limit how much you have to 0-2 drinks a day. ? Be aware of how much alcohol is in your drink. In the [place] one drink equals one 12 oz bottle of beer (355 mL), one 5 oz glass of wine (148 mL), or one 1 oz glass of hard liquor (44 mL). Lifestyle  Take daily care of your teeth and gums.  Stay active. Exercise for at least 30 minutes on 5 or more days each week.  Do not use any products that contain nicotine or tobacco, such as cigarettes, e-cigarettes, and chewing tobacco. If you need help quitting, ask your health care provider.  If you are sexually active, practice safe sex. Use a condom or other form of protection to prevent STIs (sexually transmitted infections).  Talk with your health care provider about taking a low-dose aspirin or statin. What's next?  Visit your health care provider once a year for a well check visit.  Ask your health care provider how often you should have your eyes and teeth checked.  Stay up to date on all vaccines. This information is not intended to replace advice given to you by your health care provider. Make sure you discuss any questions you have with  your health care provider. Document Revised: 10/17/2018 Document Reviewed: 10/17/2018 Elsevier Patient Education  2020 [employer].

## 2020-09-08 ENCOUNTER — Telehealth: Payer: Self-pay | Admitting: Cardiology

## 2020-09-08 DIAGNOSIS — I5030 Unspecified diastolic (congestive) heart failure: Secondary | ICD-10-CM

## 2020-09-08 DIAGNOSIS — I639 Cerebral infarction, unspecified: Secondary | ICD-10-CM | POA: Insufficient documentation

## 2020-09-08 NOTE — Telephone Encounter (Signed)
Patient with diagnosis of A Fib on Eliquis for anticoagulation.    Procedure: L Long finger Trigger Release  Date of procedure: 09/10/20   CHA2DS2-VASc Score = 5  This indicates a 7.2% annual risk of stroke. The patient's score is based upon: CHF History: 1 HTN History: 1 Diabetes History: 0 Stroke History: 2 Vascular Disease History: 0 Age Score: 1 Gender Score: 0  Added HFpEF and stroke to patient's problem list.  CrCl 53mL/min Platelet count 142  Due to patient's elevated risk, recommend patient hold Eliquis for 1 day

## 2020-09-08 NOTE — Telephone Encounter (Signed)
   [facility] Pre-operative Risk Assessment    HEARTCARE STAFF: - Please ensure there is not already an duplicate clearance open for this procedure. - Under Visit Info/Reason for Call, type in Other and utilize the format Clearance MM/DD/YY or Clearance TBD. Do not use dashes or single digits. - If request is for dental extraction, please clarify the # of teeth to be extracted.  Request for surgical clearance:  1. What type of surgery is being performed? L Long finger Trigger Release   2. When is this surgery scheduled? 09/10/20  3. What type of clearance is required (medical clearance vs. Pharmacy clearance to hold med vs. Both)? Pharmacy   4. Are there any medications that need to be held prior to surgery and how long?Eliquis TBD by Cardiology  5. Practice name and name of physician performing surgery? Dr. Milly Jakob, [place]  6. What is the office phone number? [phone] ask for Tat[place]    7.   What is the office fax number? [phone]  8.   Anesthesia type (None, local, MAC, general) ? MAC   Johnna Acosta 09/08/2020, 11:44 AM  _________________________________________________________________   (provider comments below)

## 2020-09-08 NOTE — Telephone Encounter (Signed)
   Primary Cardiologist: Buford Dresser, MD  Chart reviewed as part of pre-operative protocol coverage. Patient was contacted 09/08/2020 in reference to pre-operative risk assessment for pending surgery as outlined below.  Gordon Phillips was last seen on 03/03/2020 by Dr. Harrell Gave.  Since that day, Gordon Phillips has done well without chest pain or shortness of breath.  Therefore, based on ACC/AHA guidelines, the patient would be at acceptable risk for the planned procedure without further cardiovascular testing.   The patient was advised that if he develops new symptoms prior to surgery to contact our office to arrange for a follow-up visit, and he verbalized understanding.  I will route this recommendation to the requesting party via Epic fax function and remove from pre-op pool. Please call with questions.  Per our clinical pharmacist, he will need to hold Eliquis for 1 day prior to the procedure and restart as soon as possible after the surgery at the surgeon's discretion. I have called and informed him above recommendation.   Almyra Deforest, [place] 09/08/2020, 5:53 PM

## 2020-09-08 NOTE — Telephone Encounter (Signed)
Clinical pharmacist to review Eliquis 

## 2020-09-10 ENCOUNTER — Telehealth: Payer: Self-pay

## 2020-09-10 NOTE — Telephone Encounter (Signed)
I spoke with Gordon Phillips and let him know his lab results were WNL.  He verbalized understanding.

## 2020-09-17 ENCOUNTER — Other Ambulatory Visit: Payer: Self-pay | Admitting: Family Medicine

## 2020-09-22 ENCOUNTER — Telehealth: Payer: Self-pay

## 2020-09-22 NOTE — Telephone Encounter (Signed)
   [facility] Pre-operative Risk Assessment    Request for surgical clearance:  1. What type of surgery is being performed? BILAT L3/L4/L5 LUMBAR RADIOFREQUENCY ABLATION   2. When is this surgery scheduled? TBD   3. What type of clearance is required (medical clearance vs. Pharmacy clearance to hold med vs. Both)? BOTH  4. Are there any medications that need to be held prior to surgery and how long? NONE LISTED-PT TAKES ELUIQUIS   5. Practice name and name of physician performing surgery? [facility]  - 6. What is the office phone number? [phone]   7.   What is the office fax number? [phone]  8.   Anesthesia type (None, local, MAC, general) ? NOT LISTED-LM2CB

## 2020-09-22 NOTE — Telephone Encounter (Signed)
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    Procedure: BILAT L3/L4/L5 LUMBAR RADIOFREQUENCY ABLATION  Date of procedure: TBD  CHA2DS2-VASc Score = 5  This indicates a 7.2% annual risk of stroke. The patient's score is based upon: CHF History: 1 HTN History: 1 Diabetes History: 0 Stroke History: 2 Vascular Disease History: 0 Age Score: 1 Gender Score: 0   CrCl >122mL/min Platelet count 142K  Typically hold Eliquis for 3 days prior to spinal procedure. Due to patient's history of afib/flutter and stroke, will defer to MD. Of note, stroke did occur previously to afib diagnosis (afib dx 08/2018, old stroke noted on 2014 CT).

## 2020-09-22 NOTE — Telephone Encounter (Signed)
Katharine Look with [facility] returning Michelle's call. Katharine Look will await a call back, she can be reached at [phone]

## 2020-09-22 NOTE — Telephone Encounter (Signed)
S/w Katharine Look again she states that Dr Mina Marble would like pt to hold Eliquis 3 days prior and will use iv versed/fentanyl.

## 2020-09-28 ENCOUNTER — Ambulatory Visit: Payer: Self-pay | Admitting: Internal Medicine

## 2020-09-28 NOTE — Telephone Encounter (Signed)
Gordon Phillips was calling to check on the status of the surgical clearance

## 2020-09-29 NOTE — Telephone Encounter (Signed)
Spoke with Katharine Look from [facility]. Advised patient has appointment on 10/06/2020 for clearance. Advised deferred to MD for clearance.

## 2020-10-06 ENCOUNTER — Ambulatory Visit (INDEPENDENT_AMBULATORY_CARE_PROVIDER_SITE_OTHER): Payer: Medicare HMO | Admitting: Cardiology

## 2020-10-06 ENCOUNTER — Encounter: Payer: Self-pay | Admitting: Cardiology

## 2020-10-06 ENCOUNTER — Other Ambulatory Visit: Payer: Self-pay

## 2020-10-06 VITALS — BP 127/72 | HR 70 | Ht 74.0 in | Wt 273.4 lb

## 2020-10-06 DIAGNOSIS — I480 Paroxysmal atrial fibrillation: Secondary | ICD-10-CM | POA: Diagnosis not present

## 2020-10-06 DIAGNOSIS — I10 Essential (primary) hypertension: Secondary | ICD-10-CM | POA: Diagnosis not present

## 2020-10-06 DIAGNOSIS — I4892 Unspecified atrial flutter: Secondary | ICD-10-CM

## 2020-10-06 DIAGNOSIS — Z01810 Encounter for preprocedural cardiovascular examination: Secondary | ICD-10-CM

## 2020-10-06 DIAGNOSIS — Z7189 Other specified counseling: Secondary | ICD-10-CM

## 2020-10-06 NOTE — Telephone Encounter (Signed)
Yes, that is fine. I saw him today and will leave my preop note as well. Thanks.

## 2020-10-06 NOTE — Progress Notes (Signed)
Cardiology Office Note:    Date:  10/06/2020   ID:  Gordon Phillips, DOB 02-Dec-1945, MRN [id number]  PCP:  Vivi Barrack, MD  Cardiologist:  Buford Dresser, MD PhD EP: Dr. Caryl Comes  Referring MD: Vivi Barrack, MD   CC: follow up  History of Present Illness:    Gordon Phillips is a 74 y.o. male with a hx of atrial flutter, MGUS, back pain who is seen for follow up of his atrial flutter.  Cardiac history: 08/12/18 presented with new onset atrial flutter (initially called as a CODE STEMI). During his hospitalization, he underwent TEE/CV with conversion to NSR. He was seen for EP follow up post hospitalization to discuss flutter ablation on 09/17/18, but he was found to be in rate controlled atrial fibrillation at the time. Dofetilide load vs. Ablation discussed. After seeing Dr. Caryl Comes /2020, plan was for ablation of the flutter, but this was put on hold due to coronavirus. He underwent flutter ablation 04/23/19 by Dr. Caryl Comes.  Today: Here for preoperative cardiovascular assessment prior to surgery  Planned surgery: bilateral L3/L4/L5 lumbar radiofrequency ablation. Initially planned for 12/2 but now TBD, Dr. Mina Marble, [place]  Pertinent past cardiac history: Prior cardiac workup: echo, flutter ablation History of valve disease: none History of CAD/PAD/CVA/TIA: no symptomatic events; incidental prior CVA noted on remote imaging History of heart failure: none History of arrhythmia: prior atrial fibrillation/atrial fluter On anticoagulation: Yes, on apixaban. Recommend holding for 3 days prior to procedure and restarting as soon as able from a surgical perspective. Does have a history of stroke, though this was found incidentally on imaging. Chadsvasc=5. Given spinal procedure, will not bridge prior to procedure to minimize risk of bleeding History of hypertension: yes History of diabetes: A1c 1 year ago 6.5, most recent 5.7, no medications History of CKD: no, GFR >60 History of  OSA: yes, uses CPAP History of anesthesia complications: none Current symptoms: Denies chest pain, shortness of breath at rest or with normal exertion. No PND, orthopnea, LE edema or unexpected weight gain. No syncope or palpitations. Functional capacity: walked a lot during recent trip to [place] to see [place] race. Had to walk long distances, climb several flights of stairs, etc. Walked >0.5 miles or more at a single distance. Had severe back and leg pain with this, but no chest pain or other significant limitations.  General ROS positive for UTI, now resolved, with minimal hematuria at the time. Now on finasteride and doing well.  Past Medical History:  Diagnosis Date  . ALLERGIC RHINITIS 01/25/2010  . Arthritis    "neck, back, shoulders," (02/20/2017)  . Chronic lower back pain   . High cholesterol   . Hx of cardiovascular stress test    a. Lexiscan Myoview 02/06/13:  EF 59%, no ischemia or scar  . Hx of echocardiogram    a.  Echo 02/03/13:  Mild LVH, mild focal basal septal hypertrophy, EF 50-55%, Gr 1 DD, mild LAE  . HYPERTENSION 01/25/2010  . HYPERTHYROIDISM 01/25/2010   S/P radioactive iodine "back in the 1980s" (02/20/2017)  . Impaired glucose tolerance   . 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 Obesity   . OSA on CPAP    Uses CPAP nightly  . Pericarditis 1980's  . 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  Marland Kitchen Vitiligo     Past Surgical History:  Procedure Laterality Date  . A-FLUTTER ABLATION N/A 04/23/2019  Procedure: A-FLUTTER ABLATION;  Surgeon: Deboraha Sprang, MD;  Location: [place] CV LAB;  Service: Cardiovascular;  Laterality: N/A;  . BIOPSY  09/29/2019   Procedure: BIOPSY;  Surgeon: Thornton Park, MD;  Location: [place];  Service: Gastroenterology;;  . CARDIOVERSION N/A 08/14/2018   Procedure: CARDIOVERSION;  Surgeon: [name], MD;  Location: [facility] ENDOSCOPY;  Service: Cardiovascular;  Laterality: N/A;   . ESOPHAGOGASTRODUODENOSCOPY N/A 09/29/2019   Procedure: ESOPHAGOGASTRODUODENOSCOPY (EGD);  Surgeon: Thornton Park, MD;  Location: [place];  Service: Gastroenterology;  Laterality: N/A;  . INGUINAL HERNIA REPAIR Bilateral   . LUMBAR LAMINECTOMY    . TEE WITHOUT CARDIOVERSION N/A 08/14/2018   Procedure: TRANSESOPHAGEAL ECHOCARDIOGRAM (TEE);  Surgeon: [name], MD;  Location: [facility] ENDOSCOPY;  Service: Cardiovascular;  Laterality: N/A;  . UMBILICAL HERNIA REPAIR      Current Medications: Current Outpatient Medications on File Prior to Visit  Medication Sig  . acetaminophen-codeine (TYLENOL #3) 300-30 MG tablet Take 1 tablet by mouth every 6 (six) hours as needed for moderate pain.  . Aloe-Sodium Chloride (AYR SALINE NASAL GEL NA) Place 1 application into the nose at bedtime.   Marland Kitchen apixaban (ELIQUIS) 5 MG TABS tablet Take 1 tablet (5 mg total) by mouth 2 (two) times daily.  Marland Kitchen diltiazem (CARDIZEM CD) 120 MG 24 hr capsule Take 1 capsule (120 mg total) by mouth daily.  Marland Kitchen doxazosin (CARDURA) 2 MG tablet TAKE 1 TABLET (2 MG TOTAL) BY MOUTH AT BEDTIME.  . finasteride (PROSCAR) 5 MG tablet Take 5 mg by mouth daily.  . hydrocortisone cream 1 % Apply 1 application topically 2 (two) times daily as needed for itching.  . losartan (COZAAR) 50 MG tablet TAKE 1 TABLET BY MOUTH EVERY DAY  . metoprolol succinate (TOPROL-XL) 100 MG 24 hr tablet Take 1 tablet (100 mg total) by mouth at bedtime. Take with or immediately following a meal.  . OVER THE COUNTER MEDICATION Take 1 tablet by mouth 2 (two) times daily. OTC - Super Beta Prostate  . pravastatin (PRAVACHOL) 40 MG tablet Take 1 tablet (40 mg total) by mouth at bedtime.  Marland Kitchen PRESCRIPTION MEDICATION Inhale into the lungs at bedtime. CPAP  . sildenafil (VIAGRA) 100 MG tablet Take 100 mg by mouth daily as needed for erectile dysfunction.   . vitamin C (ASCORBIC ACID) 500 MG tablet Take 1,000 mg by mouth every morning.   No current  facility-administered medications on file prior to visit.     Allergies:   Flomax [tamsulosin hcl], Relpax [eletriptan], and Other   Social History   Tobacco Use  . Smoking status: Former Smoker    Packs/day: 0.50    Years: 8.00    Pack years: 4.00    Types: Cigarettes    Quit date: 1968    Years since quitting: 53.9  . Smokeless tobacco: Never Used  Vaping Use  . Vaping Use: Never used  Substance Use Topics  . Alcohol use: Yes    Comment: 02/20/2017 "couple drinks/month on average"  . Drug use: No    Family History: The patient's family history includes Stroke in his father; Sudden death in his brother; Throat cancer in his brother; Thyroid disease in his sister. There is no history of Colon cancer, Esophageal cancer, Rectal cancer, or Stomach cancer.  ROS:   Please see the history of present illness.  Additional pertinent ROS negative except as noted.   EKGs/Labs/Other Studies Reviewed:    The following studies were reviewed today:  TEE 08/14/18  Study Conclusions - Left ventricle: The cavity size was normal. Wall thickness was   normal. Systolic function was mildly to moderately reduced. The   estimated ejection fraction was in the range of 40% to 45%. - Left atrium: The atrium was dilated. No evidence of thrombus in   the appendage. - Right ventricle: The cavity size was dilated. Wall thickness was   normal. - Right atrium: The atrium was dilated. No evidence of thrombus in   the atrial cavity or appendage. - Atrial septum: No defect or patent foramen ovale was identified.   Echo contrast study showed no right-to-left atrial level shunt,   following an increase in RA pressure induced by provocative   maneuvers. - Superior vena cava: The study excluded a thrombus.  Echo 08/13/18 - Left ventricle: The cavity size was normal. Wall thickness was   increased in a pattern of mild LVH. There was mild focal basal   hypertrophy of the septum. Systolic function was  normal. The   estimated ejection fraction was in the range of 55% to 60%. Wall   motion was normal; there were no regional wall motion   abnormalities. The study is not technically sufficient to allow   evaluation of LV diastolic function. - Left atrium: The atrium was moderately dilated. - Pulmonary arteries: PA peak pressure: 34 mm Hg (S).  Impressions - Normal LV function; mild LVH; moderate LAE; cannot R/O left   atrial mass; suggest TEE to further assess.  Lexiscan MPI 02/2017  Nuclear stress EF: 50%.  There was no ST segment deviation noted during stress.  The study is normal.  This is a low risk study.  The left ventricular ejection fraction is mildly decreased (45-54%).  EKG:  EKG is personally reviewed.  The ekg ordered today is sinus rhythm with 1st degree AV block, HR 70 bpm  Recent Labs: 08/16/2020: ALT 17; BUN 15; Creatinine 1.07; Hemoglobin 13.1; Platelet Count 142; Potassium 4.2; Sodium 139  Recent Lipid Panel    Component Value Date/Time   CHOL 145 08/26/2019 1424   TRIG 68.0 08/26/2019 1424   HDL 40.40 08/26/2019 1424   CHOLHDL 4 08/26/2019 1424   VLDL 13.6 08/26/2019 1424   LDLCALC 91 08/26/2019 1424   LDLDIRECT 139.2 11/21/2012 1122    Physical Exam:    VS:  BP 127/72   Pulse 70   Ht 6\' 2"  (1.88 m)   Wt 273 lb 6.4 oz (124 kg)   SpO2 96%   BMI 35.10 kg/m     Wt Readings from Last 3 Encounters:  10/06/20 273 lb 6.4 oz (124 kg)  09/07/20 273 lb (123.8 kg)  08/16/20 271 lb 12.8 oz (123.3 kg)    GEN: Well nourished, well developed in no acute distress HEENT: Normal, moist mucous membranes NECK: No JVD CARDIAC: regular rhythm, normal S1 and S2, no rubs or gallops. No murmur. VASCULAR: Radial and DP pulses 2+ bilaterally. No carotid bruits RESPIRATORY:  Clear to auscultation without rales, wheezing or rhonchi  ABDOMEN: Soft, non-tender, non-distended MUSCULOSKELETAL:  Ambulates independently SKIN: Warm and dry, no edema.  Vitiligo. NEUROLOGIC:  Alert and oriented x 3. No focal neuro deficits noted. PSYCHIATRIC:  Normal affect   ASSESSMENT:    1. Preop cardiovascular exam   2. Paroxysmal atrial fibrillation (HCC)   3. Atrial flutter, unspecified type ([place])   4. Essential hypertension   5. Cardiac risk counseling    PLAN:    Preoperative cardiovascular examination: Based on available date, patient's RCRI  score = 0, which carries a 3.9% 30-day risk of death, MI, or cardiac arrest.  The patient is not currently having active cardiac symptoms, and they can achieve >4 METs of activity.  According to ACC/AHA Guidelines, no further testing is needed.  Proceed with surgery at acceptable risk.  Our service is available as needed in the peri-operative period.   The patient should remain on aspirin without interruption.  atrial flutter, with paroxysmal atrial fibrillation: s/p flutter ablation. Had acute episode of afib RVR while in ER for dysphagia, converted with cardizem bolus. No further events. -has had intermittent hematuria requiring brief cessations of anticoagulation -we discussed stroke vs. Bleeding risk today. He would like to continue apixaban for now and will hold with hematuria as he has been doing -continue apixaban. Chadsvasc= at least 35 (old stroke seen on CT, age, hypertension) -on dual nodal agents with good control, as his atrial fibrillation goes very rapidly. Continue metoprolol and diltiazem -continue use of CPAP to treat OSA  Hypertension: at goal today -continue losartan, diltiazem and metoprolol succinate  CV risk counseling and Secondary prevention counseling: -recommend heart healthy/Mediterranean diet, with whole grains, fruits, vegetable, fish, lean meats, nuts, and olive oil. Limit salt. -recommend moderate walking, 3-5 times/week for 30-50 minutes each session. Aim for at least 150 minutes.week. Goal should be pace of 3 miles/hours, or walking 1.5 miles in 30 minutes -recommend  avoidance of tobacco products. Avoid excess alcohol. -Additional risk factor control:  -Diabetes risk: denies diagosis, per [facility] A1c 5.7  -Lipids: LDL goal <70, most recent 91 on pravastatin. Had been <70 prior, will work on diet/exercise  -Blood pressure control: as above  -Weight: BMI ~34, we have discussed lifestyle.   Plan for follow up: 6 mos or sooner PRN  Medication Adjustments/Labs and Tests Ordered: Current medicines are reviewed at length with the patient today.  Concerns regarding medicines are outlined above.  Orders Placed This Encounter  Procedures  . EKG 12-Lead   No orders of the defined types were placed in this encounter.   Patient Instructions  Medication Instructions:  Your Physician recommend you continue on your current medication as directed.    *If you need a refill on your cardiac medications before your next appointment, please call your pharmacy*   Lab Work: None   Testing/Procedures: None   Follow-Up: At [facility], you and your health needs are our priority.  As part of our continuing mission to provide you with exceptional heart care, we have created designated Provider Care Teams.  These Care Teams include your primary Cardiologist (physician) and Advanced Practice Providers (APPs -  Physician Assistants and Nurse Practitioners) who all work together to provide you with the care you need, when you need it.  We recommend signing up for the patient portal called "MyChart".  Sign up information is provided on this After Visit Summary.  MyChart is used to connect with patients for Virtual Visits (Telemedicine).  Patients are able to view lab/test results, encounter notes, upcoming appointments, etc.  Non-urgent messages can be sent to your provider as well.   To learn more about what you can do with MyChart, go to [email or web address].    Your next appointment:   6 month(s)  The format for your next appointment:   In Person  Provider:    Buford Dresser, MD     Signed, Buford Dresser, MD PhD 10/06/2020  [place]

## 2020-10-06 NOTE — Patient Instructions (Signed)

## 2020-10-07 NOTE — Telephone Encounter (Signed)
   Primary Cardiologist: Buford Dresser, MD  Chart reviewed as part of pre-operative protocol coverage. Patient was contacted 10/07/2020 in reference to pre-operative risk assessment for pending surgery as outlined below.  Bryn Perkin was last seen on 10/06/20 by Dr. Harrell Gave.    Per Dr. Harrell Gave, patient my proceed with surgery without additional testing. Pt may hold eliquis for 3 days prior to surgery.   Therefore, based on ACC/AHA guidelines, the patient would be at acceptable risk for the planned procedure without further cardiovascular testing.   The patient was advised that if he develops new symptoms prior to surgery to contact our office to arrange for a follow-up visit, and he verbalized understanding.  I will route this recommendation to the requesting party via Epic fax function and remove from pre-op pool. Please call with questions.  Tami Lin Laverda Stribling, PA 10/07/2020, 3:09 PM

## 2020-10-07 NOTE — Telephone Encounter (Signed)
Gordon Phillips from [facility] states they have not received the preop clearance yet. Fax: [phone]

## 2020-10-07 NOTE — Telephone Encounter (Signed)
Pt cleared by MD to hold Eliquis for 3 days prior to procedure.

## 2020-11-09 ENCOUNTER — Other Ambulatory Visit: Payer: Self-pay

## 2020-11-09 ENCOUNTER — Ambulatory Visit: Payer: Medicare HMO | Admitting: Internal Medicine

## 2020-11-09 ENCOUNTER — Encounter: Payer: Self-pay | Admitting: Internal Medicine

## 2020-11-09 VITALS — BP 122/78 | HR 64 | Ht 74.0 in | Wt 275.8 lb

## 2020-11-09 DIAGNOSIS — R7303 Prediabetes: Secondary | ICD-10-CM | POA: Diagnosis not present

## 2020-11-09 DIAGNOSIS — Z8639 Personal history of other endocrine, nutritional and metabolic disease: Secondary | ICD-10-CM | POA: Diagnosis not present

## 2020-11-09 LAB — POCT GLYCOSYLATED HEMOGLOBIN (HGB A1C): Hemoglobin A1C: 6 % — AB (ref 4.0–5.6)

## 2020-11-09 NOTE — Progress Notes (Signed)
Patient ID: Gordon Phillips, male   DOB: December 05, 1945, 75 y.o.   MRN: [id number]   This visit occurred during the SARS-CoV-2 public health emergency.  Safety protocols were in place, including screening questions prior to the visit, additional usage of staff PPE, and extensive cleaning of exam room while observing appropriate contact time as indicated for disinfecting solutions.   HPI: Gordon Phillips is a 75 y.o.-year-old male, initially referred by his PCP, Dr. [name], returning for follow-up for prediabetes, dx in ~2013 and history of Graves' disease.  His wife, Gordon Phillips, is also my patient. Last visit 9 months ago.  He will have back Sx in 2 days. Now more sedentary 2/2 back pain, then plans to return to work and also join a Gym after the surgery.  He had left hand trigger finger surgery since last visit.  Reviewed history: Patient has a history of impaired glucose tolerance/prediabetes, but he was found to have an HbA1c in the diabetic range at last check in 08/2019.   He tells me that he knows why his HbA1c was higher: he was working part-time, but stopped working at the start of the coronavirus pandemic and started to eat more.   After the HbA1c returned high >> he started to change his diet: reduced bread, stopped sweets, increased fruit and veggies. He also tried to restart exercise but hurt his back so he is not taking it slowly.  After the HbA1c returned higher, he was started on Metformin 500 mg daily by PCP but he could not tolerate it due to GI symptoms >> abdominal pain and diarrhea. He would like to avoid medications if possible.  Reviewed HbA1c levels: Lab Results  Component Value Date   HGBA1C 5.7 (A) 02/02/2020   HGBA1C 6.5 08/26/2019   HGBA1C 6.1 (H) 02/03/2013   HGBA1C 6.2 05/27/2012   He is not on any medication for his prediabetes.  He is not checking sugars now. Prev: - am: 80-90s - 2h after b'fast: n/c - lunch: n/c - 2h after lunch: n/c - dinner: n/c - 2h  after dinner: n/c - bedtime: n/c It is unclear at which level he has hypoglycemia awareness.  Glucometer: One Touch verio  Pt's meals are: 2 meals a day now - varies: - Breakfast: Oatmeal + fruit (banana + grapes) 3x a week; eggs + grits + bacon/sausage; tuna sandwich or tuna salad; eggs + sardines + toast; granola bar >> more oatmeal + grapes + banana - Lunch: skips or peanuts or PB crackers; [employer] - Dinner:  Salmon or pork chop + veggies + rice or potatoes - Snacks: stopped icecream, juice (he was drinking a lot of this before), grapes, cookies  -+ Mild CKD, last BUN/creatinine:  Lab Results  Component Value Date   BUN 15 08/16/2020   BUN 14 02/25/2020   CREATININE 1.07 08/16/2020   CREATININE 1.04 02/25/2020  On valsartan.  -+ HL; last set of lipids: Lab Results  Component Value Date   CHOL 145 08/26/2019   HDL 40.40 08/26/2019   LDLCALC 91 08/26/2019   LDLDIRECT 139.2 11/21/2012   TRIG 68.0 08/26/2019   CHOLHDL 4 08/26/2019  On pravastatin 40.  - last eye exam was in 10/2020: No DR reportedly; + ?glaucoma.  - no numbness and tingling in his feet.  Pt has no FH of DM.  He has a history of stroke (left basal ganglia infarct) in 01/2013 -previously on Eliquis, now off.  He also has MGUS, vitiligo, h/o pericarditis.  He has  a history of Graves' disease, s/p RAI treatment in the 66s.  Review latest TFTs: Lab Results  Component Value Date   TSH 0.773 09/29/2019   TSH 0.67 08/26/2019   TSH 1.442 08/12/2018   TSH 0.57 09/18/2017   TSH 0.65 03/20/2016   He is on Toprol-XL.  ROS: Constitutional: no weight gain/no weight loss, + improved fatigue, no subjective hyperthermia, no subjective hypothermia Eyes: no blurry vision, no xerophthalmia ENT: no sore throat, no nodules palpated in neck, no dysphagia, no odynophagia, no hoarseness Cardiovascular: no CP/no SOB/no palpitations/no leg swelling Respiratory: no cough/no SOB/no wheezing Gastrointestinal: no N/no  V/no D/no C/no acid reflux Musculoskeletal: no muscle aches/no joint aches Skin: no rashes, no hair loss Neurological: no tremors/no numbness/no tingling/no dizziness, no HAs  I reviewed pt's medications, allergies, PMH, social hx, family hx, and changes were documented in the history of present illness. Otherwise, unchanged from my initial visit note.  Past Medical History:  Diagnosis Date  . ALLERGIC RHINITIS 01/25/2010  . Arthritis    "neck, back, shoulders," (02/20/2017)  . Chronic lower back pain   . High cholesterol   . Hx of cardiovascular stress test    a. Lexiscan Myoview 02/06/13:  EF 59%, no ischemia or scar  . Hx of echocardiogram    a.  Echo 02/03/13:  Mild LVH, mild focal basal septal hypertrophy, EF 50-55%, Gr 1 DD, mild LAE  . HYPERTENSION 01/25/2010  . HYPERTHYROIDISM 01/25/2010   S/P radioactive iodine "back in the 1980s" (02/20/2017)  . Impaired glucose tolerance   . 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 Obesity   . OSA on CPAP    Uses CPAP nightly  . Pericarditis 1980's  . Stroke [facility])    Old L basal ganglia infarct by CT 01/2013.  Marland Kitchen Vitiligo    Past Surgical History:  Procedure Laterality Date  . A-FLUTTER ABLATION N/A 04/23/2019   Procedure: A-FLUTTER ABLATION;  Surgeon: Duke Salvia, MD;  Location: [facility] INVASIVE CV LAB;  Service: Cardiovascular;  Laterality: N/A;  . BIOPSY  09/29/2019   Procedure: BIOPSY;  Surgeon: Tressia Danas, MD;  Location: [facility] ENDOSCOPY;  Service: Gastroenterology;;  . CARDIOVERSION N/A 08/14/2018   Procedure: CARDIOVERSION;  Surgeon: Jake Bathe, MD;  Location: [facility] ENDOSCOPY;  Service: Cardiovascular;  Laterality: N/A;  . ESOPHAGOGASTRODUODENOSCOPY N/A 09/29/2019   Procedure: ESOPHAGOGASTRODUODENOSCOPY (EGD);  Surgeon: Tressia Danas, MD;  Location: [facility] ENDOSCOPY;  Service: Gastroenterology;  Laterality: N/A;  . INGUINAL HERNIA REPAIR Bilateral   . LUMBAR LAMINECTOMY    . TEE WITHOUT  CARDIOVERSION N/A 08/14/2018   Procedure: TRANSESOPHAGEAL ECHOCARDIOGRAM (TEE);  Surgeon: Jake Bathe, MD;  Location: [facility] ENDOSCOPY;  Service: Cardiovascular;  Laterality: N/A;  . UMBILICAL HERNIA REPAIR     Social History   Socioeconomic History  . Marital status: Married    Spouse name: Not on file  . Number of children: 2  . Years of education: Not on file  . Highest education level: Not on file  Occupational History  . Occupation: Airline pilot  . Occupation: retired  Tobacco Use  . Smoking status: Former Smoker    Packs/day: 0.50    Years: 8.00    Pack years: 4.00    Types: Cigarettes    Quit date: 1968    Years since quitting: 54.0  . Smokeless tobacco: Never Used  Vaping Use  . Vaping Use: Never used  Substance and Sexual Activity  . Alcohol use: Yes  Comment: 02/20/2017 "couple drinks/month on average"  . Drug use: No  . Sexual activity: Yes  Other Topics Concern  . Not on file  Social History Narrative  . Not on file   Social Determinants of Health   Financial Resource Strain: Low Risk   . Difficulty of Paying Living Expenses: Not hard at all  Food Insecurity: No Food Insecurity  . Worried About Programme researcher, broadcasting/film/videounning Out of Food in the Last Year: Never true  . Ran Out of Food in the Last Year: Never true  Transportation Needs: No Transportation Needs  . Lack of Transportation (Medical): No  . Lack of Transportation (Non-Medical): No  Physical Activity: Insufficiently Active  . Days of Exercise per Week: 2 days  . Minutes of Exercise per Session: 30 min  Stress: No Stress Concern Present  . Feeling of Stress : Not at all  Social Connections: Moderately Isolated  . Frequency of Communication with Friends and Family: More than three times a week  . Frequency of Social Gatherings with Friends and Family: More than three times a week  . Attends Religious Services: Never  . Active Member of Clubs or Organizations: No  . Attends BankerClub or Organization Meetings: Never  . Marital  Status: Married  Catering managerntimate Partner Violence: Not At Risk  . Fear of Current or Ex-Partner: No  . Emotionally Abused: No  . Physically Abused: No  . Sexually Abused: No   Current Outpatient Medications on File Prior to Visit  Medication Sig Dispense Refill  . acetaminophen-codeine (TYLENOL #3) 300-30 MG tablet Take 1 tablet by mouth every 6 (six) hours as needed for moderate pain. 10 tablet 0  . Aloe-Sodium Chloride (AYR SALINE NASAL GEL NA) Place 1 application into the nose at bedtime.     Marland Kitchen. apixaban (ELIQUIS) 5 MG TABS tablet Take 1 tablet (5 mg total) by mouth 2 (two) times daily. 180 tablet 3  . diltiazem (CARDIZEM CD) 120 MG 24 hr capsule Take 1 capsule (120 mg total) by mouth daily. 90 capsule 3  . doxazosin (CARDURA) 2 MG tablet TAKE 1 TABLET (2 MG TOTAL) BY MOUTH AT BEDTIME. 90 tablet 2  . finasteride (PROSCAR) 5 MG tablet Take 5 mg by mouth daily.    . hydrocortisone cream 1 % Apply 1 application topically 2 (two) times daily as needed for itching.    . losartan (COZAAR) 50 MG tablet TAKE 1 TABLET BY MOUTH EVERY DAY 90 tablet 1  . metoprolol succinate (TOPROL-XL) 100 MG 24 hr tablet Take 1 tablet (100 mg total) by mouth at bedtime. Take with or immediately following a meal. 90 tablet 3  . OVER THE COUNTER MEDICATION Take 1 tablet by mouth 2 (two) times daily. OTC - Super Beta Prostate    . pravastatin (PRAVACHOL) 40 MG tablet Take 1 tablet (40 mg total) by mouth at bedtime. 90 tablet 3  . PRESCRIPTION MEDICATION Inhale into the lungs at bedtime. CPAP    . sildenafil (VIAGRA) 100 MG tablet Take 100 mg by mouth daily as needed for erectile dysfunction.     . vitamin C (ASCORBIC ACID) 500 MG tablet Take 1,000 mg by mouth every morning.     No current facility-administered medications on file prior to visit.   Allergies  Allergen Reactions  . Flomax [Tamsulosin Hcl] Itching  . Relpax [Eletriptan] Other (See Comments) and Hypertension    Chest pain and sweating  . Other Palpitations     Any steroids cause heart palpitations  (tolerates low  doses)   Family History  Problem Relation Age of Onset  . Stroke Father        Passed away in his 19s  . Thyroid disease Sister        goiter  . Sudden death Brother        ? Drug use, no autopsy  . Throat cancer Brother   . Colon cancer Neg Hx   . Esophageal cancer Neg Hx   . Rectal cancer Neg Hx   . Stomach cancer Neg Hx    PE: BP 122/78   Pulse 64   Ht 6\' 2"  (1.88 m)   Wt 275 lb 12.8 oz (125.1 kg)   SpO2 96%   BMI 35.41 kg/m  Wt Readings from Last 3 Encounters:  11/09/20 275 lb 12.8 oz (125.1 kg)  10/06/20 273 lb 6.4 oz (124 kg)  09/07/20 273 lb (123.8 kg)   Constitutional: overweight, in NAD Eyes: PERRLA, EOMI, no exophthalmos ENT: moist mucous membranes, no thyromegaly, no cervical lymphadenopathy Cardiovascular: RRR, No MRG Respiratory: CTA B Gastrointestinal: abdomen soft, NT, ND, BS+ Musculoskeletal: no deformities, strength intact in all 4 Skin: moist, warm, no rashes Neurological: + tremor with outstretched left hand, DTR normal in all 4  ASSESSMENT: 1.  Prediabetes  2.  History of Graves' disease  PLAN:  1. Patient with history of prediabetes but an HbA1c in the low diabetic range in the past, improved to 5.7% PCP tried to start him on vitamin D in the past, but he could not tolerate this even at the lowest dose.  During the coronavirus the knee, he stopped working and relaxed his diet and HbA1c increased to 6.5%.  After this, he started to change his diet and eliminate sweets, juice, and bread.  Sugars improved so we were able to avoid starting medications, which is his preference.  At last visit I advised him to check his sugars daily, rotating check times and let me know if sugars worsen. -At today's visit, however, he is not checking sugars at all. I explained why this is important. I advised him to at least check every other day. He will check it to see if he has the strips and lancets and if not,  will let me know so I can call in prescriptions for him. - We checked his HbA1c: 6.0% (slightly higher, but still at goal). No need to start medications for now. He is now more sedentary but plans to be more active after his back surgery. - I suggested to:  Patient Instructions  Please check sugars once a day or every other day.  Please let me know if the sugars are consistently <70 or >160.  Please return in 6 months with your sugar log.   - advised to check sugars at different times of the day - 1x a day, rotating check times - advised for yearly eye exams >> he is UTD - he is due for lipid panel-we will check this today. He is fasting. - return to clinic in 6 months  2. H/o Graves' disease, -Status post RAI treatment in the 1980s -We are following him with yearly PFTs -Latest TFTs were normal in 09/2019 -He will need another set of TFTs now.  Pt did not stop at the lab...  10/2019, MD PhD [facility] Endocrinology

## 2020-11-09 NOTE — Patient Instructions (Addendum)
Please check sugars once a day or every other day.  Please let me know if the sugars are consistently <70 or >160.  Please return in 6 months with your sugar log.

## 2020-11-29 ENCOUNTER — Other Ambulatory Visit: Payer: Self-pay | Admitting: Family Medicine

## 2020-12-02 ENCOUNTER — Telehealth: Payer: Self-pay | Admitting: Internal Medicine

## 2020-12-02 NOTE — Telephone Encounter (Signed)
error 

## 2020-12-20 ENCOUNTER — Other Ambulatory Visit: Payer: Self-pay | Admitting: Family Medicine

## 2020-12-21 ENCOUNTER — Emergency Department (HOSPITAL_COMMUNITY): Payer: Medicare HMO

## 2020-12-21 ENCOUNTER — Other Ambulatory Visit: Payer: Self-pay

## 2020-12-21 ENCOUNTER — Emergency Department (HOSPITAL_COMMUNITY)
Admission: EM | Admit: 2020-12-21 | Discharge: 2020-12-22 | Disposition: A | Discharge status: Home / Self Care | Payer: Medicare HMO | Attending: Emergency Medicine | Admitting: Emergency Medicine

## 2020-12-21 DIAGNOSIS — Z7901 Long term (current) use of anticoagulants: Secondary | ICD-10-CM | POA: Insufficient documentation

## 2020-12-21 DIAGNOSIS — I509 Heart failure, unspecified: Secondary | ICD-10-CM | POA: Insufficient documentation

## 2020-12-21 DIAGNOSIS — R0789 Other chest pain: Secondary | ICD-10-CM | POA: Diagnosis present

## 2020-12-21 DIAGNOSIS — I110 Hypertensive heart disease with heart failure: Secondary | ICD-10-CM | POA: Insufficient documentation

## 2020-12-21 DIAGNOSIS — Z79899 Other long term (current) drug therapy: Secondary | ICD-10-CM | POA: Diagnosis not present

## 2020-12-21 DIAGNOSIS — I480 Paroxysmal atrial fibrillation: Secondary | ICD-10-CM | POA: Insufficient documentation

## 2020-12-21 DIAGNOSIS — Z87891 Personal history of nicotine dependence: Secondary | ICD-10-CM | POA: Insufficient documentation

## 2020-12-21 LAB — BASIC METABOLIC PANEL
Anion gap: 8 (ref 5–15)
BUN: 13 mg/dL (ref 8–23)
CO2: 22 mmol/L (ref 22–32)
Calcium: 9 mg/dL (ref 8.9–10.3)
Chloride: 106 mmol/L (ref 98–111)
Creatinine, Ser: 1 mg/dL (ref 0.61–1.24)
GFR, Estimated: >60 mL/min (ref >60)
Glucose, Bld: 108 mg/dL — ABNORMAL HIGH (ref 70–99)
Potassium: 3.4 mmol/L — ABNORMAL LOW (ref 3.5–5.1)
Sodium: 136 mmol/L (ref 135–145)

## 2020-12-21 LAB — CBC
HCT: 40.1 % (ref 39.0–52.0)
Hemoglobin: 13.2 g/dL (ref 13.0–17.0)
MCH: 30.8 pg (ref 26.0–34.0)
MCHC: 32.9 g/dL (ref 30.0–36.0)
MCV: 93.7 fL (ref 80.0–100.0)
Platelets: 143 10*3/uL — ABNORMAL LOW (ref 150–400)
RBC: 4.28 MIL/uL (ref 4.22–5.81)
RDW: 13.7 % (ref 11.5–15.5)
WBC: 6.1 10*3/uL (ref 4.0–10.5)
nRBC: 0 % (ref 0.0–0.2)

## 2020-12-21 LAB — TROPONIN I (HIGH SENSITIVITY)
Troponin I (High Sensitivity): 8 ng/L (ref <18)
Troponin I (High Sensitivity): 4 ng/L (ref <18)

## 2020-12-21 MED ORDER — POTASSIUM CHLORIDE CRYS ER 20 MEQ PO TBCR
40.0000 meq | EXTENDED_RELEASE_TABLET | Freq: Once | ORAL | Status: AC
  Administered 2020-12-22: 40 meq via ORAL
  Filled 2020-12-21: qty 2

## 2020-12-21 MED ORDER — METOPROLOL SUCCINATE ER 100 MG PO TB24
100.0000 mg | ORAL_TABLET | Freq: Every day | ORAL | Status: DC
  Administered 2020-12-22: 100 mg via ORAL
  Filled 2020-12-21: qty 1

## 2020-12-21 MED ORDER — DILTIAZEM HCL 25 MG/5ML IV SOLN
10.0000 mg | Freq: Once | INTRAVENOUS | Status: AC
  Administered 2020-12-21: 10 mg via INTRAVENOUS
  Filled 2020-12-21: qty 5

## 2020-12-21 MED ORDER — PANTOPRAZOLE SODIUM 20 MG PO TBEC: 20.0000 mg | DELAYED_RELEASE_TABLET | Freq: Two times a day (BID) | ORAL | 0 refills | Status: DC

## 2020-12-21 NOTE — ED Provider Notes (Signed)
[facility] EMERGENCY DEPARTMENT Provider Note   CSN: [id number] Arrival date & time: 12/21/20  1913     History Chief Complaint  Patient presents with  . Atrial Fibrillation    Gordon Phillips is a 75 y.o. male.  HPI   Pt states he has been having recurrent issues with his a fib.  His heart rate will frequently be elevated especially after work.  It also tends to be elevated after eating.  Yesterday he felt his heart racing just walking from the dentists office.  He called his doctor and has an appointment next week.  This evening after eating he went in to extreme a fib.  He thought he was having a heart attack.  He was feeling sweaty, cramping tight chest pain, and rapid heart beat.  He feels much better but he still has some pressure in his chest.  Past Medical History:  Diagnosis Date  . ALLERGIC RHINITIS 01/25/2010  . Arthritis    "neck, back, shoulders," (02/20/2017)  . Chronic lower back pain   . High cholesterol   . Hx of cardiovascular stress test    a. Lexiscan Myoview 02/06/13:  EF 59%, no ischemia or scar  . Hx of echocardiogram    a.  Echo 02/03/13:  Mild LVH, mild focal basal septal hypertrophy, EF 50-55%, Gr 1 DD, mild LAE  . HYPERTENSION 01/25/2010  . HYPERTHYROIDISM 01/25/2010   S/P radioactive iodine "back in the 1980s" (02/20/2017)  . Impaired glucose tolerance   . 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 Obesity   . OSA on CPAP    Uses CPAP nightly  . Pericarditis 1980's  . 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  Marland Kitchen Vitiligo     Patient Active Problem List   Diagnosis Date Noted  . (HFpEF) heart failure with preserved ejection fraction ([place]) 09/08/2020  . Stroke of unknown cause ([place]) 09/08/2020  . Facial pain 10/09/2019  . Dysphagia 09/29/2019  . Atrial fibrillation with RVR ([place]) 09/29/2019  . Prediabetes 09/19/2019  . H/O Graves' disease 09/19/2019  . Deviated septum  08/20/2018  . BPH (benign prostatic hyperplasia) 08/20/2018  . Atrial flutter ([place]) 08/12/2018  . HLD (hyperlipidemia) 02/20/2017  . History of CVA (cerebrovascular accident) 02/20/2017  . Back pain, chronic 11/22/2015  . MGUS (monoclonal gammopathy of unknown significance) 01/21/2015  . OSA (obstructive sleep apnea) 03/16/2014  . Essential hypertension 01/25/2010  . Allergic rhinitis 01/25/2010    Past Surgical History:  Procedure Laterality Date  . A-FLUTTER ABLATION N/A 04/23/2019   Procedure: A-FLUTTER ABLATION;  Surgeon: Deboraha Sprang, MD;  Location: [facility] CV LAB;  Service: Cardiovascular;  Laterality: N/A;  . BIOPSY  09/29/2019   Procedure: BIOPSY;  Surgeon: Thornton Park, MD;  Location: [facility];  Service: Gastroenterology;;  . CARDIOVERSION N/A 08/14/2018   Procedure: CARDIOVERSION;  Surgeon: [name], MD;  Location: [facility] ENDOSCOPY;  Service: Cardiovascular;  Laterality: N/A;  . ESOPHAGOGASTRODUODENOSCOPY N/A 09/29/2019   Procedure: ESOPHAGOGASTRODUODENOSCOPY (EGD);  Surgeon: Thornton Park, MD;  Location: [facility];  Service: Gastroenterology;  Laterality: N/A;  . INGUINAL HERNIA REPAIR Bilateral   . LUMBAR LAMINECTOMY    . TEE WITHOUT CARDIOVERSION N/A 08/14/2018   Procedure: TRANSESOPHAGEAL ECHOCARDIOGRAM (TEE);  Surgeon: [name], MD;  Location: [facility] ENDOSCOPY;  Service: Cardiovascular;  Laterality: N/A;  . UMBILICAL HERNIA REPAIR         Family History  Problem Relation Age of Onset  . Stroke Father        Passed away in his 53s  . Thyroid disease Sister        goiter  . Sudden death Brother        ? Drug use, no autopsy  . Throat cancer Brother   . Colon cancer Neg Hx   . Esophageal cancer Neg Hx   . Rectal cancer Neg Hx   . Stomach cancer Neg Hx     Social History   Tobacco Use  . Smoking status: Former Smoker    Packs/day: 0.50    Years: 8.00    Pack years: 4.00    Types: Cigarettes    Quit date: 1968    Years since  quitting: 54.1  . Smokeless tobacco: Never Used  Vaping Use  . Vaping Use: Never used  Substance Use Topics  . Alcohol use: Yes    Comment: 02/20/2017 "couple drinks/month on average"  . Drug use: No    Home Medications Prior to Admission medications   Medication Sig Start Date End Date Taking? Authorizing Provider  pantoprazole (PROTONIX) 20 MG tablet Take 1 tablet (20 mg total) by mouth 2 (two) times daily. 12/21/20  Yes Dorie Rank, MD  acetaminophen-codeine (TYLENOL #3) 300-30 MG tablet Take 1 tablet by mouth every 6 (six) hours as needed for moderate pain. 10/22/19   Vivi Barrack, MD  Aloe-Sodium Chloride (AYR SALINE NASAL GEL NA) Place 1 application into the nose at bedtime.     [provider]  apixaban (ELIQUIS) 5 MG TABS tablet Take 1 tablet (5 mg total) by mouth 2 (two) times daily. 12/15/19 10/06/20  Vivi Barrack, MD  diltiazem (CARDIZEM CD) 120 MG 24 hr capsule TAKE 1 CAPSULE BY MOUTH EVERY DAY 11/30/20   Vivi Barrack, MD  doxazosin (CARDURA) 2 MG tablet TAKE 1 TABLET (2 MG TOTAL) BY MOUTH AT BEDTIME. 09/17/20 12/16/20  Vivi Barrack, MD  finasteride (PROSCAR) 5 MG tablet Take 5 mg by mouth daily. 07/24/20   [provider]  hydrocortisone cream 1 % Apply 1 application topically 2 (two) times daily as needed for itching.    [provider]  losartan (COZAAR) 50 MG tablet TAKE 1 TABLET BY MOUTH EVERY DAY 08/26/20   Vivi Barrack, MD  metoprolol succinate (TOPROL-XL) 100 MG 24 hr tablet TAKE 1 TABLET (100 MG TOTAL) BY MOUTH AT BEDTIME. TAKE WITH OR IMMEDIATELY FOLLOWING A MEAL. 12/20/20   Vivi Barrack, MD  OVER THE COUNTER MEDICATION Take 1 tablet by mouth 2 (two) times daily. OTC - Super Beta Prostate    [provider]  pravastatin (PRAVACHOL) 40 MG tablet TAKE 1 TABLET BY MOUTH EVERYDAY AT BEDTIME 11/30/20   Vivi Barrack, MD  PRESCRIPTION MEDICATION Inhale into the lungs at bedtime. CPAP    [provider]  sildenafil  (VIAGRA) 100 MG tablet Take 100 mg by mouth daily as needed for erectile dysfunction.     [provider]  vitamin C (ASCORBIC ACID) 500 MG tablet Take 1,000 mg by mouth every morning.    [provider]    Allergies    Flomax [tamsulosin hcl], Relpax [eletriptan], and Other  Review of Systems   Review of Systems  All other systems reviewed and are negative.   Physical Exam Updated Vital Signs BP (!) 148/90   Pulse 87   Temp 98 F (36.7 C) (Oral)   Resp 16  SpO2 98%   Physical Exam Vitals and nursing note reviewed.  Constitutional:      General: He is not in acute distress.    Appearance: He is well-developed and well-nourished.  HENT:     Head: Normocephalic and atraumatic.     Right Ear: External ear normal.     Left Ear: External ear normal.  Eyes:     General: No scleral icterus.       Right eye: No discharge.        Left eye: No discharge.     Conjunctiva/sclera: Conjunctivae normal.  Neck:     Trachea: No tracheal deviation.  Cardiovascular:     Rate and Rhythm: Tachycardia present. Rhythm irregular.     Pulses: Intact distal pulses.  Pulmonary:     Effort: Pulmonary effort is normal. No respiratory distress.     Breath sounds: Normal breath sounds. No stridor. No wheezing or rales.  Abdominal:     General: Bowel sounds are normal. There is no distension.     Palpations: Abdomen is soft.     Tenderness: There is no abdominal tenderness. There is no guarding or rebound.  Musculoskeletal:        General: No tenderness or edema.     Cervical back: Neck supple.  Skin:    General: Skin is warm and dry.     Findings: No rash.  Neurological:     Mental Status: He is alert.     Cranial Nerves: No cranial nerve deficit (no facial droop, extraocular movements intact, no slurred speech).     Sensory: No sensory deficit.     Motor: No abnormal muscle tone or seizure activity.     Coordination: Coordination normal.     Deep Tendon Reflexes:  Strength normal.  Psychiatric:        Mood and Affect: Mood and affect normal.     ED Results / Procedures / Treatments   Labs (all labs ordered are listed, but only abnormal results are displayed) Labs Reviewed  BASIC METABOLIC PANEL - Abnormal; Notable for the following components:      Result Value   Potassium 3.4 (*)    Glucose, Bld 108 (*)    All other components within normal limits  CBC - Abnormal; Notable for the following components:   Platelets 143 (*)    All other components within normal limits  TROPONIN I (HIGH SENSITIVITY)  TROPONIN I (HIGH SENSITIVITY)    EKG EKG Interpretation  Date/Time:  Tuesday December 21 2020 19:33:51 EST Ventricular Rate:  121 PR Interval:    QRS Duration: 74 QT Interval:  310 QTC Calculation: 440 R Axis:   86 Text Interpretation: Atrial fibrillation with rapid ventricular response ST & T wave abnormality, consider inferior ischemia Abnormal ECG No significant change since last tracing Confirmed by Dorie Rank [phone]) on 12/21/2020 8:32:24 PM   Radiology DG Chest 2 View  Result Date: 12/21/2020 CLINICAL DATA:  Chest pain EXAM: CHEST - 2 VIEW COMPARISON:  09/29/2019 FINDINGS: The heart size and mediastinal contours are within normal limits. Both lungs are clear. The visualized skeletal structures are unremarkable. IMPRESSION: No active cardiopulmonary disease. Electronically Signed   By: Donavan Foil M.D.   On: 12/21/2020 20:01    Procedures Procedures   Medications Ordered in ED Medications  potassium chloride SA (KLOR-CON) CR tablet 40 mEq (has no administration in time range)  diltiazem (CARDIZEM) injection 10 mg (10 mg Intravenous Given 12/21/20 2247)    ED Course  I have reviewed the triage vital signs and the nursing notes.  Pertinent labs & imaging results that were available during my care of the patient were reviewed by me and considered in my medical decision making (see chart for details).  Clinical Course as of  12/21/20 2323  Tue Dec 21, 2020  2226 Patient's initial labs are reassuring.  He does still appear to be in atrial fibrillation on the monitor. [GL]  8756 I discussed the option of cardioversion in the ED as patient is normally in sinus rhythm and he is back in A. fib and symptomatic. [EP]  3295 Patient states he would first like to talk to cardiologist.  I will consult with cardiology on-call [JK]  2252 DIscussed case with Dr Alveta Heimlich.  Pt likely going in and out of a fib.  We could cardiovert but may go back in again.  Would benefit from seeing EP first. May need antiarrhythmic agent.  Can increase cardizem or toprol  [JK]    Clinical Course User Index [JK] Dorie Rank, MD   MDM Rules/Calculators/A&P     CHA2DS2-VASc Score: 3                    Patient presented to ED for evaluation of tachycardia and chest pain.  Patient does have history of A. fib.  Patient has had a prior ablation.  He had back in sinus rhythm for at least a year but it seems like in the last month or so he has been having recurrent episodes.  In the ED he definitely is in A. fib.  Patient is rate controlled.  I mention the possibility of cardioversion with him.  He requested cardiac consultation so I discussed the case with Dr. Justice Britain.  She does not feel the patient requires cardioversion this evening as he is rate controlled and there is a good chance he may go back into it again considering this paroxysmal nature of his symptoms recently.  Patient can increase his Toprol or his Cardizem if he has any recurrent episodes.  Patient also mentions a lot of issues when he is eating certain foods.  I wonder if he is having some gastroesophageal irritation.  I will discharge him on a course of Protonix.  I provided a consult to the A. fib clinic.  Dr. Leonette Monarch will follow up on second troponin and recheck the patient.  If serial troponins are normal anticipate discharge with close outpatient follow-up.  Final Clinical  Impression(s) / ED Diagnoses Final diagnoses:  Paroxysmal atrial fibrillation ([place])    Rx / DC Orders ED Discharge Orders         Ordered    Amb Referral to AFIB Clinic       Comments: Recurrent a flutter a fib, has had ablation procedure.   12/21/20 2255    pantoprazole (PROTONIX) 20 MG tablet  2 times daily        12/21/20 2317           Dorie Rank, MD 12/21/20 2323

## 2020-12-21 NOTE — ED Triage Notes (Signed)
Pt states he feels he is "winding down," denies dizziness, N/V, wants to be checked out so he can return to work as scheduled tomorrow.

## 2020-12-21 NOTE — ED Triage Notes (Signed)
Hx afib, ablation, denies issues with both until 3-4wks ago, when he began having issues with dehydration, states that yesterday he had "crazy afib & pain." Today was eating a snack & felt his heart go into "crazy afib" again.

## 2020-12-22 NOTE — Discharge Instructions (Signed)
Please hold your Toprolol for tonight. You may restart all your meds in the morning.  If you notice recurring episodes, you can either increase your Toprolol to 150mg  (1-1/2 tablet) or your Diltiazem to 180 mg (1-1/2 tablet).

## 2020-12-22 NOTE — ED Notes (Signed)
Pt ambulated 

## 2020-12-22 NOTE — ED Provider Notes (Signed)
I assumed care of this patient.  Please see previous provider note for further details of Hx, PE.  Briefly patient is a 75 y.o. male who presented AFRVR and chest discomfort. Rate controlled with diltiazem. Already antiocoagulated. Trops x 2 negative. CP related to rate.  Getting kdur and toprolol. Plan to reassess.  2:01 AM Patient stable. Still rate controlled. Ambulated and no longer symptomatic. Cards f/u.  The patient appears reasonably screened and/or stabilized for discharge and I doubt any other medical condition or other [facility] requiring further screening, evaluation, or treatment in the ED at this time prior to discharge. Safe for discharge with strict return precautions.  Disposition: Discharge  Condition: Good  I have discussed the results, Dx and Tx plan with the patient/family who expressed understanding and agree(s) with the plan. Discharge instructions discussed at length. The patient/family was given strict return precautions who verbalized understanding of the instructions. No further questions at time of discharge.    ED Discharge Orders         Ordered    Amb Referral to AFIB Clinic       Comments: Recurrent a flutter a fib, has had ablation procedure.   12/21/20 2255    pantoprazole (PROTONIX) 20 MG tablet  2 times daily        12/21/20 2317            Follow Up: Vivi Barrack, MD [address] [place] [address] [phone]     Cardiology  Call  if you have not been called about your appointment by Thursday.         Fatima Blank, MD 12/22/20 [phone]

## 2020-12-27 NOTE — H&P (View-Only) (Signed)
Primary Care Physician: Vivi Barrack, MD Primary Cardiologist: Dr Harrell Gave  Primary Electrophysiologist: Dr Caryl Comes Referring Physician: [facility]   Gordon Phillips is a 75 y.o. male with a history of HTN, OSA w/CPAP, MUGUS, CVA (by imaging), remote pericarditis, CBP, vitiligo and migraines, atrial flutter s/p ablation, and atrial fibrillation who presents for follow up in the [facility]. The patient was initially diagnosed with atrial fibrillation and atrial flutter in  2019. He underwent flutter ablation with Dr Caryl Comes on 04/23/19. Patient is on Eliquis for a CHADS2VASC score of 4. He was in his usual state of health until 12/20/20 when he had tachypalpitations while walking. The next day, his palpitations worsened and he also developed chest pain and presented to the ED. ECG showed rapid afib. He was not cardioverted at that time because it was unclear if he was paroxysmal or persistent. He remains out of rhythm today. He denies any significant alcohol use.   Today, he denies symptoms of palpitations, chest pain, shortness of breath, orthopnea, PND, lower extremity edema, dizziness, presyncope, syncope, snoring, daytime somnolence, bleeding, or neurologic sequela. The patient is tolerating medications without difficulties and is otherwise without complaint today.    Atrial Fibrillation Risk Factors:  he does have symptoms or diagnosis of sleep apnea. he is compliant with CPAP therapy. he does not have a history of rheumatic fever. he does not have a history of alcohol use. The patient does not have a history of early familial atrial fibrillation or other arrhythmias.  he has a BMI of Body mass index is 35.69 kg/m.Marland Kitchen Filed Weights   12/28/20 1013  Weight: 126.1 kg    Family History  Problem Relation Age of Onset  . Stroke Father        Passed away in his 77s  . Thyroid disease Sister        goiter  . Sudden death Brother        ? Drug use, no  autopsy  . Throat cancer Brother   . Colon cancer Neg Hx   . Esophageal cancer Neg Hx   . Rectal cancer Neg Hx   . Stomach cancer Neg Hx      Atrial Fibrillation Management history:  Previous antiarrhythmic drugs: none Previous cardioversions: 2019 Previous ablations: 04/23/19 CHADS2VASC score: 4 Anticoagulation history: Eliquis   Past Medical History:  Diagnosis Date  . ALLERGIC RHINITIS 01/25/2010  . Arthritis    "neck, back, shoulders," (02/20/2017)  . Chronic lower back pain   . High cholesterol   . Hx of cardiovascular stress test    a. Lexiscan Myoview 02/06/13:  EF 59%, no ischemia or scar  . Hx of echocardiogram    a.  Echo 02/03/13:  Mild LVH, mild focal basal septal hypertrophy, EF 50-55%, Gr 1 DD, mild LAE  . HYPERTENSION 01/25/2010  . HYPERTHYROIDISM 01/25/2010   S/P radioactive iodine "back in the 1980s" (02/20/2017)  . Impaired glucose tolerance   . 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 Obesity   . OSA on CPAP    Uses CPAP nightly  . Pericarditis 1980's  . 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  Marland Kitchen Vitiligo    Past Surgical History:  Procedure Laterality Date  . A-FLUTTER ABLATION N/A 04/23/2019   Procedure: A-FLUTTER ABLATION;  Surgeon: Deboraha Sprang, MD;  Location: [place] [facility];  Service: Cardiovascular;  Laterality: N/A;  .  BIOPSY  09/29/2019   Procedure: BIOPSY;  Surgeon: Thornton Park, MD;  Location: [place];  Service: Gastroenterology;;  . CARDIOVERSION N/A 08/14/2018   Procedure: CARDIOVERSION;  Surgeon: [name], MD;  Location: [facility] ENDOSCOPY;  Service: Cardiovascular;  Laterality: N/A;  . ESOPHAGOGASTRODUODENOSCOPY N/A 09/29/2019   Procedure: ESOPHAGOGASTRODUODENOSCOPY (EGD);  Surgeon: Thornton Park, MD;  Location: [place];  Service: Gastroenterology;  Laterality: N/A;  . INGUINAL HERNIA REPAIR Bilateral   . LUMBAR LAMINECTOMY    . TEE WITHOUT CARDIOVERSION N/A  08/14/2018   Procedure: TRANSESOPHAGEAL ECHOCARDIOGRAM (TEE);  Surgeon: [name], MD;  Location: [facility] ENDOSCOPY;  Service: Cardiovascular;  Laterality: N/A;  . UMBILICAL HERNIA REPAIR      Current Outpatient Medications  Medication Sig Dispense Refill  . acetaminophen-codeine (TYLENOL #3) 300-30 MG tablet Take 1 tablet by mouth every 6 (six) hours as needed for moderate pain. 10 tablet 0  . Aloe-Sodium Chloride (AYR SALINE NASAL GEL NA) Place 1 application into the nose at bedtime.     Marland Kitchen apixaban (ELIQUIS) 5 MG TABS tablet Take 1 tablet (5 mg total) by mouth 2 (two) times daily. 180 tablet 3  . diltiazem (CARDIZEM CD) 120 MG 24 hr capsule TAKE 1 CAPSULE BY MOUTH EVERY DAY 90 capsule 3  . doxazosin (CARDURA) 2 MG tablet TAKE 1 TABLET (2 MG TOTAL) BY MOUTH AT BEDTIME. 90 tablet 2  . finasteride (PROSCAR) 5 MG tablet Take 5 mg by mouth daily.    . hydrocortisone cream 1 % Apply 1 application topically 2 (two) times daily as needed for itching.    . losartan (COZAAR) 50 MG tablet TAKE 1 TABLET BY MOUTH EVERY DAY 90 tablet 1  . metoprolol succinate (TOPROL-XL) 100 MG 24 hr tablet TAKE 1 TABLET (100 MG TOTAL) BY MOUTH AT BEDTIME. TAKE WITH OR IMMEDIATELY FOLLOWING A MEAL. 90 tablet 3  . OVER THE COUNTER MEDICATION Take 1 tablet by mouth 2 (two) times daily. OTC - Super Beta Prostate    . pravastatin (PRAVACHOL) 40 MG tablet TAKE 1 TABLET BY MOUTH EVERYDAY AT BEDTIME 90 tablet 3  . PRESCRIPTION MEDICATION Inhale into the lungs at bedtime. CPAP    . sildenafil (VIAGRA) 100 MG tablet Take 100 mg by mouth daily as needed for erectile dysfunction.     . vitamin C (ASCORBIC ACID) 500 MG tablet Take 1,000 mg by mouth every morning.    Marland Kitchen oxyCODONE (OXY IR/ROXICODONE) 5 MG immediate release tablet Take 5 mg by mouth every 6 (six) hours as needed.     No current facility-administered medications for this encounter.    Allergies  Allergen Reactions  . Flomax [Tamsulosin Hcl] Itching  . Relpax  [Eletriptan] Other (See Comments) and Hypertension    Chest pain and sweating  . Other Palpitations    Any steroids cause heart palpitations  (tolerates low doses)    Social History   Socioeconomic History  . Marital status: Married    Spouse name: Not on file  . Number of children: 2  . Years of education: Not on file  . Highest education level: Not on file  Occupational History  . Occupation: Press photographer  . Occupation: retired  Tobacco Use  . Smoking status: Former Smoker    Packs/day: 0.50    Years: 8.00    Pack years: 4.00    Types: Cigarettes    Quit date: 1968    Years since quitting: 54.1  . Smokeless tobacco: Never Used  Vaping Use  . Vaping  Use: Never used  Substance and Sexual Activity  . Alcohol use: Yes    Alcohol/week: 1.0 - 2.0 standard drink    Types: 1 - 2 Shots of liquor per week    Comment: 02/20/2017 "couple drinks/month on average"  . Drug use: No  . Sexual activity: Yes  Other Topics Concern  . Not on file  Social History Narrative  . Not on file   Social Determinants of Health   Financial Resource Strain: Low Risk   . Difficulty of Paying Living Expenses: Not hard at all  Food Insecurity: No Food Insecurity  . Worried About Charity fundraiser in the Last Year: Never true  . Ran Out of Food in the Last Year: Never true  Transportation Needs: No Transportation Needs  . Lack of Transportation (Medical): No  . Lack of Transportation (Non-Medical): No  Physical Activity: Insufficiently Active  . Days of Exercise per Week: 2 days  . Minutes of Exercise per Session: 30 min  Stress: No Stress Concern Present  . Feeling of Stress : Not at all  Social Connections: Moderately Isolated  . Frequency of Communication with Friends and Family: More than three times a week  . Frequency of Social Gatherings with Friends and Family: More than three times a week  . Attends Religious Services: Never  . Active Member of Clubs or Organizations: No  . Attends English as a second language teacher Meetings: Never  . Marital Status: Married  Human resources officer Violence: Not At Risk  . Fear of Current or Ex-Partner: No  . Emotionally Abused: No  . Physically Abused: No  . Sexually Abused: No     ROS- All systems are reviewed and negative except as per the HPI above.  Physical Exam: Vitals:   12/28/20 1013  BP: 124/90  Pulse: (!) 115  Weight: 126.1 kg  Height: 6\' 2"  (1.88 m)    GEN- The patient is well appearing obese male, alert and oriented x 3 today.   Head- normocephalic, atraumatic Eyes-  Sclera clear, conjunctiva pink Ears- hearing intact Oropharynx- clear Neck- supple  Lungs- Clear to ausculation bilaterally, normal work of breathing Heart- irregular rate and rhythm, tachycardia, no murmurs, rubs or gallops  GI- soft, NT, ND, + BS Extremities- no clubbing, cyanosis, or edema MS- no significant deformity or atrophy Skin- no rash or lesion Psych- euthymic mood, full affect Neuro- strength and sensation are intact  Wt Readings from Last 3 Encounters:  12/28/20 126.1 kg  11/09/20 125.1 kg  10/06/20 124 kg    EKG today demonstrates  Atypical atrial flutter with variable block vs coarse afib  Vent. rate 115 BPM QRS duration 84 ms QT/QTc 346/478 ms  Echo 08/13/18 demonstrated  - Left ventricle: The cavity size was normal. Wall thickness was  increased in a pattern of mild LVH. There was mild focal basal  hypertrophy of the septum. Systolic function was normal. The  estimated ejection fraction was in the range of 55% to 60%. Wall  motion was normal; there were no regional wall motion  abnormalities. The study is not technically sufficient to allow  evaluation of LV diastolic function.  - Left atrium: The atrium was moderately dilated.  - Pulmonary arteries: PA peak pressure: 34 mm Hg (S).   Impressions:   - Normal LV function; mild LVH; moderate LAE; cannot R/O left  atrial mass; suggest TEE to further assess.   Epic  records are reviewed at length today  CHA2DS2-VASc Score = 5  The  patient's score is based upon: CHF History: Yes HTN History: Yes Diabetes History: No Stroke History: Yes Vascular Disease History: No Age Score: 1 Gender Score: 0      ASSESSMENT AND PLAN: 1. Persistent Atrial Fibrillation/atrial flutter The patient's CHA2DS2-VASc score is 5, indicating a 7.2% annual risk of stroke.   We discussed therapeutic options today. He appears persistently out of rhythm.  Will arrange DCCV. If he has return of afib, he is interested in repeat ablation. 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  Continue Eliquis 5 mg BID Continue Toprol 100 mg daily Continue diltiazem 120 mg daily  2. Secondary Hypercoagulable State (ICD10:  D68.69) The patient is at significant risk for stroke/thromboembolism based upon his CHA2DS2-VASc Score of 5.  Continue Apixaban (Eliquis).   3. Obesity Body mass index is 35.69 kg/m. Lifestyle modification was discussed at length including regular exercise and weight reduction.  4. Obstructive sleep apnea The importance of adequate treatment of sleep apnea was discussed today in order to improve our ability to maintain sinus rhythm long term. Patient reports compliance with CPAP therapy.  5. HTN Stable, no changes today.  6. H/o hematuria He is now on finasteride with no further issues. If he has recurrent hematuria, could consider Watchman. Information given.    Follow up in the AF clinic post DCCV.   [facility] [address] [place], [place] [address] [phone] 12/28/2020 10:21 AM

## 2020-12-27 NOTE — Telephone Encounter (Signed)
Encounter opened in error

## 2020-12-27 NOTE — Progress Notes (Signed)
Primary Care Physician: Vivi Barrack, MD Primary Cardiologist: Dr Harrell Gave  Primary Electrophysiologist: Dr Caryl Comes Referring Physician: [facility]   Gordon Phillips is a 75 y.o. male with a history of HTN, OSA w/CPAP, MUGUS, CVA (by imaging), remote pericarditis, CBP, vitiligo and migraines, atrial flutter s/p ablation, and atrial fibrillation who presents for follow up in the [facility]. The patient was initially diagnosed with atrial fibrillation and atrial flutter in  2019. He underwent flutter ablation with Dr Caryl Comes on 04/23/19. Patient is on Eliquis for a CHADS2VASC score of 4. He was in his usual state of health until 12/20/20 when he had tachypalpitations while walking. The next day, his palpitations worsened and he also developed chest pain and presented to the ED. ECG showed rapid afib. He was not cardioverted at that time because it was unclear if he was paroxysmal or persistent. He remains out of rhythm today. He denies any significant alcohol use.   Today, he denies symptoms of palpitations, chest pain, shortness of breath, orthopnea, PND, lower extremity edema, dizziness, presyncope, syncope, snoring, daytime somnolence, bleeding, or neurologic sequela. The patient is tolerating medications without difficulties and is otherwise without complaint today.    Atrial Fibrillation Risk Factors:  he does have symptoms or diagnosis of sleep apnea. he is compliant with CPAP therapy. he does not have a history of rheumatic fever. he does not have a history of alcohol use. The patient does not have a history of early familial atrial fibrillation or other arrhythmias.  he has a BMI of Body mass index is 35.69 kg/m.Marland Kitchen Filed Weights   12/28/20 1013  Weight: 126.1 kg    Family History  Problem Relation Age of Onset  . Stroke Father        Passed away in his 72s  . Thyroid disease Sister        goiter  . Sudden death Brother        ? Drug use, no  autopsy  . Throat cancer Brother   . Colon cancer Neg Hx   . Esophageal cancer Neg Hx   . Rectal cancer Neg Hx   . Stomach cancer Neg Hx      Atrial Fibrillation Management history:  Previous antiarrhythmic drugs: none Previous cardioversions: 2019 Previous ablations: 04/23/19 CHADS2VASC score: 4 Anticoagulation history: Eliquis   Past Medical History:  Diagnosis Date  . ALLERGIC RHINITIS 01/25/2010  . Arthritis    "neck, back, shoulders," (02/20/2017)  . Chronic lower back pain   . High cholesterol   . Hx of cardiovascular stress test    a. Lexiscan Myoview 02/06/13:  EF 59%, no ischemia or scar  . Hx of echocardiogram    a.  Echo 02/03/13:  Mild LVH, mild focal basal septal hypertrophy, EF 50-55%, Gr 1 DD, mild LAE  . HYPERTENSION 01/25/2010  . HYPERTHYROIDISM 01/25/2010   S/P radioactive iodine "back in the 1980s" (02/20/2017)  . Impaired glucose tolerance   . 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 Obesity   . OSA on CPAP    Uses CPAP nightly  . Pericarditis 1980's  . 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  Marland Kitchen Vitiligo    Past Surgical History:  Procedure Laterality Date  . A-FLUTTER ABLATION N/A 04/23/2019   Procedure: A-FLUTTER ABLATION;  Surgeon: Deboraha Sprang, MD;  Location: [place] [facility];  Service: Cardiovascular;  Laterality: N/A;  .  BIOPSY  09/29/2019   Procedure: BIOPSY;  Surgeon: Thornton Park, MD;  Location: [place];  Service: Gastroenterology;;  . CARDIOVERSION N/A 08/14/2018   Procedure: CARDIOVERSION;  Surgeon: [name], MD;  Location: [facility] ENDOSCOPY;  Service: Cardiovascular;  Laterality: N/A;  . ESOPHAGOGASTRODUODENOSCOPY N/A 09/29/2019   Procedure: ESOPHAGOGASTRODUODENOSCOPY (EGD);  Surgeon: Thornton Park, MD;  Location: [place];  Service: Gastroenterology;  Laterality: N/A;  . INGUINAL HERNIA REPAIR Bilateral   . LUMBAR LAMINECTOMY    . TEE WITHOUT CARDIOVERSION N/A  08/14/2018   Procedure: TRANSESOPHAGEAL ECHOCARDIOGRAM (TEE);  Surgeon: [name], MD;  Location: [facility] ENDOSCOPY;  Service: Cardiovascular;  Laterality: N/A;  . UMBILICAL HERNIA REPAIR      Current Outpatient Medications  Medication Sig Dispense Refill  . acetaminophen-codeine (TYLENOL #3) 300-30 MG tablet Take 1 tablet by mouth every 6 (six) hours as needed for moderate pain. 10 tablet 0  . Aloe-Sodium Chloride (AYR SALINE NASAL GEL NA) Place 1 application into the nose at bedtime.     Marland Kitchen apixaban (ELIQUIS) 5 MG TABS tablet Take 1 tablet (5 mg total) by mouth 2 (two) times daily. 180 tablet 3  . diltiazem (CARDIZEM CD) 120 MG 24 hr capsule TAKE 1 CAPSULE BY MOUTH EVERY DAY 90 capsule 3  . doxazosin (CARDURA) 2 MG tablet TAKE 1 TABLET (2 MG TOTAL) BY MOUTH AT BEDTIME. 90 tablet 2  . finasteride (PROSCAR) 5 MG tablet Take 5 mg by mouth daily.    . hydrocortisone cream 1 % Apply 1 application topically 2 (two) times daily as needed for itching.    . losartan (COZAAR) 50 MG tablet TAKE 1 TABLET BY MOUTH EVERY DAY 90 tablet 1  . metoprolol succinate (TOPROL-XL) 100 MG 24 hr tablet TAKE 1 TABLET (100 MG TOTAL) BY MOUTH AT BEDTIME. TAKE WITH OR IMMEDIATELY FOLLOWING A MEAL. 90 tablet 3  . OVER THE COUNTER MEDICATION Take 1 tablet by mouth 2 (two) times daily. OTC - Super Beta Prostate    . pravastatin (PRAVACHOL) 40 MG tablet TAKE 1 TABLET BY MOUTH EVERYDAY AT BEDTIME 90 tablet 3  . PRESCRIPTION MEDICATION Inhale into the lungs at bedtime. CPAP    . sildenafil (VIAGRA) 100 MG tablet Take 100 mg by mouth daily as needed for erectile dysfunction.     . vitamin C (ASCORBIC ACID) 500 MG tablet Take 1,000 mg by mouth every morning.    Marland Kitchen oxyCODONE (OXY IR/ROXICODONE) 5 MG immediate release tablet Take 5 mg by mouth every 6 (six) hours as needed.     No current facility-administered medications for this encounter.    Allergies  Allergen Reactions  . Flomax [Tamsulosin Hcl] Itching  . Relpax  [Eletriptan] Other (See Comments) and Hypertension    Chest pain and sweating  . Other Palpitations    Any steroids cause heart palpitations  (tolerates low doses)    Social History   Socioeconomic History  . Marital status: Married    Spouse name: Not on file  . Number of children: 2  . Years of education: Not on file  . Highest education level: Not on file  Occupational History  . Occupation: Press photographer  . Occupation: retired  Tobacco Use  . Smoking status: Former Smoker    Packs/day: 0.50    Years: 8.00    Pack years: 4.00    Types: Cigarettes    Quit date: 1968    Years since quitting: 54.1  . Smokeless tobacco: Never Used  Vaping Use  . Vaping  Use: Never used  Substance and Sexual Activity  . Alcohol use: Yes    Alcohol/week: 1.0 - 2.0 standard drink    Types: 1 - 2 Shots of liquor per week    Comment: 02/20/2017 "couple drinks/month on average"  . Drug use: No  . Sexual activity: Yes  Other Topics Concern  . Not on file  Social History Narrative  . Not on file   Social Determinants of Health   Financial Resource Strain: Low Risk   . Difficulty of Paying Living Expenses: Not hard at all  Food Insecurity: No Food Insecurity  . Worried About Charity fundraiser in the Last Year: Never true  . Ran Out of Food in the Last Year: Never true  Transportation Needs: No Transportation Needs  . Lack of Transportation (Medical): No  . Lack of Transportation (Non-Medical): No  Physical Activity: Insufficiently Active  . Days of Exercise per Week: 2 days  . Minutes of Exercise per Session: 30 min  Stress: No Stress Concern Present  . Feeling of Stress : Not at all  Social Connections: Moderately Isolated  . Frequency of Communication with Friends and Family: More than three times a week  . Frequency of Social Gatherings with Friends and Family: More than three times a week  . Attends Religious Services: Never  . Active Member of Clubs or Organizations: No  . Attends English as a second language teacher Meetings: Never  . Marital Status: Married  Human resources officer Violence: Not At Risk  . Fear of Current or Ex-Partner: No  . Emotionally Abused: No  . Physically Abused: No  . Sexually Abused: No     ROS- All systems are reviewed and negative except as per the HPI above.  Physical Exam: Vitals:   12/28/20 1013  BP: 124/90  Pulse: (!) 115  Weight: 126.1 kg  Height: 6\' 2"  (1.88 m)    GEN- The patient is well appearing obese male, alert and oriented x 3 today.   Head- normocephalic, atraumatic Eyes-  Sclera clear, conjunctiva pink Ears- hearing intact Oropharynx- clear Neck- supple  Lungs- Clear to ausculation bilaterally, normal work of breathing Heart- irregular rate and rhythm, tachycardia, no murmurs, rubs or gallops  GI- soft, NT, ND, + BS Extremities- no clubbing, cyanosis, or edema MS- no significant deformity or atrophy Skin- no rash or lesion Psych- euthymic mood, full affect Neuro- strength and sensation are intact  Wt Readings from Last 3 Encounters:  12/28/20 126.1 kg  11/09/20 125.1 kg  10/06/20 124 kg    EKG today demonstrates  Atypical atrial flutter with variable block vs coarse afib  Vent. rate 115 BPM QRS duration 84 ms QT/QTc 346/478 ms  Echo 08/13/18 demonstrated  - Left ventricle: The cavity size was normal. Wall thickness was  increased in a pattern of mild LVH. There was mild focal basal  hypertrophy of the septum. Systolic function was normal. The  estimated ejection fraction was in the range of 55% to 60%. Wall  motion was normal; there were no regional wall motion  abnormalities. The study is not technically sufficient to allow  evaluation of LV diastolic function.  - Left atrium: The atrium was moderately dilated.  - Pulmonary arteries: PA peak pressure: 34 mm Hg (S).   Impressions:   - Normal LV function; mild LVH; moderate LAE; cannot R/O left  atrial mass; suggest TEE to further assess.   Epic  records are reviewed at length today  CHA2DS2-VASc Score = 5  The  patient's score is based upon: CHF History: Yes HTN History: Yes Diabetes History: No Stroke History: Yes Vascular Disease History: No Age Score: 1 Gender Score: 0      ASSESSMENT AND PLAN: 1. Persistent Atrial Fibrillation/atrial flutter The patient's CHA2DS2-VASc score is 5, indicating a 7.2% annual risk of stroke.   We discussed therapeutic options today. He appears persistently out of rhythm.  Will arrange DCCV. If he has return of afib, he is interested in repeat ablation. 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  Continue Eliquis 5 mg BID Continue Toprol 100 mg daily Continue diltiazem 120 mg daily  2. Secondary Hypercoagulable State (ICD10:  D68.69) The patient is at significant risk for stroke/thromboembolism based upon his CHA2DS2-VASc Score of 5.  Continue Apixaban (Eliquis).   3. Obesity Body mass index is 35.69 kg/m. Lifestyle modification was discussed at length including regular exercise and weight reduction.  4. Obstructive sleep apnea The importance of adequate treatment of sleep apnea was discussed today in order to improve our ability to maintain sinus rhythm long term. Patient reports compliance with CPAP therapy.  5. HTN Stable, no changes today.  6. H/o hematuria He is now on finasteride with no further issues. If he has recurrent hematuria, could consider Watchman. Information given.    Follow up in the AF clinic post DCCV.   [facility] [address] [place], [place] [address] [phone] 12/28/2020 10:21 AM

## 2020-12-28 ENCOUNTER — Ambulatory Visit: Payer: Medicare HMO | Admitting: General Practice

## 2020-12-28 ENCOUNTER — Ambulatory Visit (HOSPITAL_COMMUNITY)
Admission: RE | Admit: 2020-12-28 | Discharge: 2020-12-28 | Disposition: A | Discharge status: Home / Self Care | Payer: Medicare HMO | Source: Ambulatory Visit | Attending: Physician Assistant | Admitting: Physician Assistant

## 2020-12-28 ENCOUNTER — Other Ambulatory Visit: Payer: Self-pay

## 2020-12-28 ENCOUNTER — Encounter (HOSPITAL_COMMUNITY): Payer: Self-pay | Admitting: Physician Assistant

## 2020-12-28 VITALS — BP 124/90 | HR 115 | Ht 74.0 in | Wt 278.0 lb

## 2020-12-28 DIAGNOSIS — G4733 Obstructive sleep apnea (adult) (pediatric): Secondary | ICD-10-CM | POA: Diagnosis not present

## 2020-12-28 DIAGNOSIS — Z01818 Encounter for other preprocedural examination: Secondary | ICD-10-CM | POA: Diagnosis present

## 2020-12-28 DIAGNOSIS — I484 Atypical atrial flutter: Secondary | ICD-10-CM | POA: Diagnosis not present

## 2020-12-28 DIAGNOSIS — Z7901 Long term (current) use of anticoagulants: Secondary | ICD-10-CM | POA: Diagnosis not present

## 2020-12-28 DIAGNOSIS — Z79899 Other long term (current) drug therapy: Secondary | ICD-10-CM | POA: Diagnosis not present

## 2020-12-28 DIAGNOSIS — Z87891 Personal history of nicotine dependence: Secondary | ICD-10-CM | POA: Diagnosis not present

## 2020-12-28 DIAGNOSIS — I10 Essential (primary) hypertension: Secondary | ICD-10-CM | POA: Diagnosis not present

## 2020-12-28 DIAGNOSIS — I4891 Unspecified atrial fibrillation: Secondary | ICD-10-CM | POA: Insufficient documentation

## 2020-12-28 DIAGNOSIS — I4819 Other persistent atrial fibrillation: Secondary | ICD-10-CM

## 2020-12-28 DIAGNOSIS — D6869 Other thrombophilia: Secondary | ICD-10-CM | POA: Insufficient documentation

## 2020-12-28 DIAGNOSIS — Z8673 Personal history of transient ischemic attack (TIA), and cerebral infarction without residual deficits: Secondary | ICD-10-CM | POA: Insufficient documentation

## 2020-12-28 DIAGNOSIS — I4892 Unspecified atrial flutter: Secondary | ICD-10-CM | POA: Diagnosis not present

## 2020-12-28 NOTE — Patient Instructions (Signed)
Cardioversion scheduled for Tuesday, March 1st  - Arrive at the [facility] and go to admitting at 730AM  - Do not eat or drink anything after midnight the night prior to your procedure.  - Take all your morning medication (except diabetic medications) with a sip of water prior to arrival.  - You will not be able to drive home after your procedure.  - Do NOT miss any doses of your blood thinner - if you should miss a dose please notify our office immediately.  - If you feel as if you go back into normal rhythm prior to scheduled cardioversion, please notify our office immediately. If your procedure is canceled in the cardioversion suite you will be charged a cancellation fee.

## 2021-01-01 ENCOUNTER — Other Ambulatory Visit (HOSPITAL_COMMUNITY)
Admission: RE | Admit: 2021-01-01 | Discharge: 2021-01-01 | Disposition: A | Discharge status: Home / Self Care | Payer: Medicare HMO | Source: Ambulatory Visit | Attending: Internal Medicine | Admitting: Internal Medicine

## 2021-01-01 DIAGNOSIS — Z20822 Contact with and (suspected) exposure to covid-19: Secondary | ICD-10-CM | POA: Insufficient documentation

## 2021-01-01 DIAGNOSIS — Z01812 Encounter for preprocedural laboratory examination: Secondary | ICD-10-CM | POA: Insufficient documentation

## 2021-01-01 LAB — SARS CORONAVIRUS 2 (TAT 6-24 HRS): SARS Coronavirus 2: NEGATIVE

## 2021-01-03 NOTE — Anesthesia Preprocedure Evaluation (Addendum)
Anesthesia Evaluation  Patient identified by MRN, date of birth, ID band Patient awake    Reviewed: Allergy & Precautions, NPO status , Patient's Chart, lab work & pertinent test results, reviewed documented beta blocker date and time   History of Anesthesia Complications Negative for: history of anesthetic complications  Airway Mallampati: II  TM Distance: >3 FB Neck ROM: Full    Dental no notable dental hx.    Pulmonary sleep apnea , former smoker,    Pulmonary exam normal        Cardiovascular hypertension, Pt. on medications and Pt. on home beta blockers Normal cardiovascular exam+ dysrhythmias Atrial Fibrillation   TEE 2019: EF 40-45%, LAE/RAE, RVE    Neuro/Psych CVA negative psych ROS   GI/Hepatic negative GI ROS, Neg liver ROS,   Endo/Other  negative endocrine ROS  Renal/GU negative Renal ROS  negative genitourinary   Musculoskeletal negative musculoskeletal ROS (+)   Abdominal   Peds  Hematology negative hematology ROS (+)   Anesthesia Other Findings Day of surgery medications reviewed with patient.  Reproductive/Obstetrics negative OB ROS                            Anesthesia Physical Anesthesia Plan  ASA: III  Anesthesia Plan: General   Post-op Pain Management:    Induction: Intravenous  PONV Risk Score and Plan: Treatment may vary due to age or medical condition and Propofol infusion  Airway Management Planned: Mask  Additional Equipment: None  Intra-op Plan:   Post-operative Plan:   Informed Consent: I have reviewed the patients History and Physical, chart, labs and discussed the procedure including the risks, benefits and alternatives for the proposed anesthesia with the patient or authorized representative who has indicated his/her understanding and acceptance.       Plan Discussed with: CRNA  Anesthesia Plan Comments:        Anesthesia Quick  Evaluation

## 2021-01-04 ENCOUNTER — Ambulatory Visit (HOSPITAL_COMMUNITY): Payer: Medicare HMO | Admitting: Anesthesiology

## 2021-01-04 ENCOUNTER — Other Ambulatory Visit: Payer: Self-pay

## 2021-01-04 ENCOUNTER — Encounter (HOSPITAL_COMMUNITY)
Admission: RE | Disposition: A | Discharge status: Home / Self Care | Payer: Self-pay | Source: Home / Self Care | Attending: Internal Medicine

## 2021-01-04 ENCOUNTER — Encounter (HOSPITAL_COMMUNITY): Payer: Self-pay | Admitting: Internal Medicine

## 2021-01-04 ENCOUNTER — Ambulatory Visit (HOSPITAL_COMMUNITY)
Admission: RE | Admit: 2021-01-04 | Discharge: 2021-01-04 | Disposition: A | Discharge status: Home / Self Care | Payer: Medicare HMO | Attending: Internal Medicine | Admitting: Internal Medicine

## 2021-01-04 DIAGNOSIS — Z87448 Personal history of other diseases of urinary system: Secondary | ICD-10-CM | POA: Diagnosis not present

## 2021-01-04 DIAGNOSIS — Z888 Allergy status to other drugs, medicaments and biological substances status: Secondary | ICD-10-CM | POA: Diagnosis not present

## 2021-01-04 DIAGNOSIS — Z823 Family history of stroke: Secondary | ICD-10-CM | POA: Insufficient documentation

## 2021-01-04 DIAGNOSIS — Z87891 Personal history of nicotine dependence: Secondary | ICD-10-CM | POA: Insufficient documentation

## 2021-01-04 DIAGNOSIS — E669 Obesity, unspecified: Secondary | ICD-10-CM | POA: Diagnosis not present

## 2021-01-04 DIAGNOSIS — I4891 Unspecified atrial fibrillation: Secondary | ICD-10-CM | POA: Diagnosis present

## 2021-01-04 DIAGNOSIS — Z8673 Personal history of transient ischemic attack (TIA), and cerebral infarction without residual deficits: Secondary | ICD-10-CM | POA: Insufficient documentation

## 2021-01-04 DIAGNOSIS — I10 Essential (primary) hypertension: Secondary | ICD-10-CM | POA: Diagnosis not present

## 2021-01-04 DIAGNOSIS — I4819 Other persistent atrial fibrillation: Secondary | ICD-10-CM | POA: Diagnosis not present

## 2021-01-04 DIAGNOSIS — I4892 Unspecified atrial flutter: Secondary | ICD-10-CM | POA: Diagnosis not present

## 2021-01-04 DIAGNOSIS — Z7901 Long term (current) use of anticoagulants: Secondary | ICD-10-CM | POA: Diagnosis not present

## 2021-01-04 DIAGNOSIS — G4733 Obstructive sleep apnea (adult) (pediatric): Secondary | ICD-10-CM | POA: Diagnosis not present

## 2021-01-04 DIAGNOSIS — Z6835 Body mass index (BMI) 35.0-35.9, adult: Secondary | ICD-10-CM | POA: Diagnosis not present

## 2021-01-04 DIAGNOSIS — Z79899 Other long term (current) drug therapy: Secondary | ICD-10-CM | POA: Diagnosis not present

## 2021-01-04 HISTORY — PX: CARDIOVERSION: SHX1299

## 2021-01-04 SURGERY — CARDIOVERSION
Anesthesia: General

## 2021-01-04 MED ORDER — LIDOCAINE 2% (20 MG/ML) 5 ML SYRINGE
INTRAMUSCULAR | Status: DC | PRN
  Administered 2021-01-04: 60 mg via INTRAVENOUS

## 2021-01-04 MED ORDER — PROMETHAZINE HCL 25 MG/ML IJ SOLN
6.2500 mg | INTRAMUSCULAR | Status: DC | PRN
Start: 2021-01-04 — End: 2021-01-04

## 2021-01-04 MED ORDER — PROPOFOL 10 MG/ML IV BOLUS
INTRAVENOUS | Status: DC | PRN
  Administered 2021-01-04: 80 mg via INTRAVENOUS

## 2021-01-04 MED ORDER — SODIUM CHLORIDE 0.9 % IV SOLN: INTRAVENOUS | Status: DC | PRN

## 2021-01-04 NOTE — Discharge Instructions (Signed)
Electrical Cardioversion Electrical cardioversion is the delivery of a jolt of electricity to restore a normal rhythm to the heart. A rhythm that is too fast or is not regular keeps the heart from pumping well. In this procedure, sticky patches or metal paddles are placed on the chest to deliver electricity to the heart from a device. This procedure may be done in an emergency if:  There is low or no blood pressure as a result of the heart rhythm.  Normal rhythm must be restored as fast as possible to protect the brain and heart from further damage.  It may save a life. This may also be a scheduled procedure for irregular or fast heart rhythms that are not immediately life-threatening. Tell a health care provider about:  Any allergies you have.  All medicines you are taking, including vitamins, herbs, eye drops, creams, and over-the-counter medicines.  Any problems you or family members have had with anesthetic medicines.  Any blood disorders you have.  Any surgeries you have had.  Any medical conditions you have.  Whether you are pregnant or may be pregnant. What are the risks? Generally, this is a safe procedure. However, problems may occur, including:  Allergic reactions to medicines.  A blood clot that breaks free and travels to other parts of your body.  The possible return of an abnormal heart rhythm within hours or days after the procedure.  Your heart stopping (cardiac arrest). This is rare. What happens before the procedure? Medicines  Your health care provider may have you start taking: ? Blood-thinning medicines (anticoagulants) so your blood does not clot as easily. ? Medicines to help stabilize your heart rate and rhythm.  Ask your health care provider about: ? Changing or stopping your regular medicines. This is especially important if you are taking diabetes medicines or blood thinners. ? Taking medicines such as aspirin and ibuprofen. These medicines can  thin your blood. Do not take these medicines unless your health care provider tells you to take them. ? Taking over-the-counter medicines, vitamins, herbs, and supplements. General instructions  Follow instructions from your health care provider about eating or drinking restrictions.  Plan to have someone take you home from the hospital or clinic.  If you will be going home right after the procedure, plan to have someone with you for 24 hours.  Ask your health care provider what steps will be taken to help prevent infection. These may include washing your skin with a germ-killing soap. What happens during the procedure?  An IV will be inserted into one of your veins.  Sticky patches (electrodes) or metal paddles may be placed on your chest.  You will be given a medicine to help you relax (sedative).  An electrical shock will be delivered. The procedure may vary among health care providers and hospitals.   What can I expect after the procedure?  Your blood pressure, heart rate, breathing rate, and blood oxygen level will be monitored until you leave the hospital or clinic.  Your heart rhythm will be watched to make sure it does not change.  You may have some redness on the skin where the shocks were given. Follow these instructions at home:  Do not drive for 24 hours if you were given a sedative during your procedure.  Take over-the-counter and prescription medicines only as told by your health care provider.  Ask your health care provider how to check your pulse. Check it often.  Rest for 48 hours after the procedure   or as told by your health care provider.  Avoid or limit your caffeine use as told by your health care provider.  Keep all follow-up visits as told by your health care provider. This is important. Contact a health care provider if:  You feel like your heart is beating too quickly or your pulse is not regular.  You have a serious muscle cramp that does not go  away. Get help right away if:  You have discomfort in your chest.  You are dizzy or you feel faint.  You have trouble breathing or you are short of breath.  Your speech is slurred.  You have trouble moving an arm or leg on one side of your body.  Your fingers or toes turn cold or blue. Summary  Electrical cardioversion is the delivery of a jolt of electricity to restore a normal rhythm to the heart.  This procedure may be done right away in an emergency or may be a scheduled procedure if the condition is not an emergency.  Generally, this is a safe procedure.  After the procedure, check your pulse often as told by your health care provider. This information is not intended to replace advice given to you by your health care provider. Make sure you discuss any questions you have with your health care provider. Document Revised: 05/26/2019 Document Reviewed: 05/26/2019 Elsevier Patient Education  2021 Elsevier Inc.  

## 2021-01-04 NOTE — Interval H&P Note (Signed)
History and Physical Interval Note:  01/04/2021 8:17 AM  Gordon Phillips  has presented today for surgery, with the diagnosis of AFIB.  The various methods of treatment have been discussed with the patient and family. After consideration of risks, benefits and other options for treatment, the patient has consented to  Procedure(s): CARDIOVERSION (N/A) as a surgical intervention.  The patient's history has been reviewed, patient examined, no change in status, stable for surgery.  I have reviewed the patient's chart and labs.  Questions were answered to the patient's satisfaction.     Pixie Casino

## 2021-01-04 NOTE — CV Procedure (Signed)
   CARDIOVERSION NOTE  Procedure: Electrical Cardioversion Indications:  Atrial Fibrillation  Procedure Details:  Consent: Risks of procedure as well as the alternatives and risks of each were explained to the (patient/caregiver).  Consent for procedure obtained.  Time Out: Verified patient identification, verified procedure, site/side was marked, verified correct patient position, special equipment/implants available, medications/allergies/relevent history reviewed, required imaging and test results available.  Performed  Patient placed on cardiac monitor, pulse oximetry, supplemental oxygen as necessary.  Sedation given: propofol per anesthesia Pacer pads placed anterior and posterior chest.  Cardioverted 1 time(s).  Cardioverted at 150J biphasic.  Impression: Findings: Post procedure EKG shows: NSR Complications: None Patient did tolerate procedure well.  Plan: 1. Successful DCCV with a single 150J biphasic shock.  Time Spent Directly with the Patient:  30 minutes   Pixie Casino, MD, [facility], [place] Director of the [facility]  [facility] Diplomate of the American Board of Clinical Lipidology Attending Cardiologist  Direct Dial: [phone]  Fax: [phone]  Website:  [email or web address] 01/04/2021, 8:36 AM

## 2021-01-04 NOTE — Anesthesia Postprocedure Evaluation (Signed)
Anesthesia Post Note  Patient: Gordon Phillips  Procedure(s) Performed: CARDIOVERSION (N/A )     Patient location during evaluation: PACU Anesthesia Type: General Level of consciousness: awake and alert and oriented Pain management: pain level controlled Vital Signs Assessment: post-procedure vital signs reviewed and stable Respiratory status: spontaneous breathing, nonlabored ventilation and respiratory function stable Cardiovascular status: blood pressure returned to baseline Postop Assessment: no apparent nausea or vomiting Anesthetic complications: no   No complications documented.  Last Vitals:  Vitals:   01/04/21 0852 01/04/21 0902  BP: 116/78 113/73  Pulse: 71 69  Resp: (!) 8 11  Temp:    SpO2: 98% 95%    Last Pain:  Vitals:   01/04/21 0902  TempSrc:   PainSc: 0-No pain                 [name]

## 2021-01-04 NOTE — Anesthesia Procedure Notes (Signed)
Procedure Name: General with mask airway Date/Time: 01/04/2021 8:33 AM Performed by: Colin Benton, CRNA Pre-anesthesia Checklist: Patient identified, Emergency Drugs available, Suction available, Patient being monitored and Timeout performed Patient Re-evaluated:Patient Re-evaluated prior to induction Oxygen Delivery Method: Ambu bag Preoxygenation: Pre-oxygenation with 100% oxygen Induction Type: IV induction Placement Confirmation: positive ETCO2 Dental Injury: Teeth and Oropharynx as per pre-operative assessment

## 2021-01-04 NOTE — Transfer of Care (Signed)
Immediate Anesthesia Transfer of Care Note  Patient: Gordon Phillips  Procedure(s) Performed: CARDIOVERSION (N/A )  Patient Location: endo suite  Anesthesia Type:General  Level of Consciousness: drowsy  Airway & Oxygen Therapy: Patient Spontanous Breathing  Post-op Assessment: Report given to RN and Post -op Vital signs reviewed and stable  Post vital signs: Reviewed and stable  Last Vitals:  Vitals Value Taken Time  BP 123/78 0841  Temp    Pulse 74 0841  Resp 14 0841  SpO2 92 0841    Last Pain:  Vitals:   01/04/21 0739  TempSrc: Temporal  PainSc: 0-No pain         Complications: No complications documented.

## 2021-01-05 ENCOUNTER — Encounter (HOSPITAL_COMMUNITY): Payer: Self-pay | Admitting: Internal Medicine

## 2021-01-10 NOTE — Progress Notes (Signed)
Primary Care Physician: Vivi Barrack, MD Primary Cardiologist: Dr Harrell Gave  Primary Electrophysiologist: Dr Caryl Comes Referring Physician: [facility]   Gordon Phillips is a 75 y.o. male with a history of HTN, OSA w/CPAP, MUGUS, CVA (by imaging), remote pericarditis, CBP, vitiligo and migraines, atrial flutter s/p ablation, and atrial fibrillation who presents for follow up in the [facility]. The patient was initially diagnosed with atrial fibrillation and atrial flutter in  2019. He underwent flutter ablation with Dr Caryl Comes on 04/23/19. Patient is on Eliquis for a CHADS2VASC score of 4. He was in his usual state of health until 12/20/20 when he had tachypalpitations while walking. The next day, his palpitations worsened and he also developed chest pain and presented to the ED. ECG showed rapid afib. He was not cardioverted at that time because it was unclear if he was paroxysmal or persistent. He denies any significant alcohol use.   On follow up today, patient is s/p DCCV on 01/04/21. He reports that initially he felt "100%" but did have fatigue with exertion a few days later. These symptoms resolved but returned again this AM. He is back in rate controlled atrial flutter today. There does not appear to be any specific triggers. He denies any bleeding issues on anticoagulation.   Today, he denies symptoms of palpitations, chest pain, shortness of breath, orthopnea, PND, lower extremity edema, dizziness, presyncope, syncope, bleeding, or neurologic sequela. The patient is tolerating medications without difficulties and is otherwise without complaint today.    Atrial Fibrillation Risk Factors:  he does have symptoms or diagnosis of sleep apnea. he is compliant with CPAP therapy. he does not have a history of rheumatic fever. he does not have a history of alcohol use. The patient does not have a history of early familial atrial fibrillation or other arrhythmias.  he  has a BMI of Body mass index is 35.54 kg/m.Marland Kitchen Filed Weights   01/11/21 1011  Weight: 125.6 kg    Family History  Problem Relation Age of Onset  . Stroke Father        Passed away in his 38s  . Thyroid disease Sister        goiter  . Sudden death Brother        ? Drug use, no autopsy  . Throat cancer Brother   . Colon cancer Neg Hx   . Esophageal cancer Neg Hx   . Rectal cancer Neg Hx   . Stomach cancer Neg Hx      Atrial Fibrillation Management history:  Previous antiarrhythmic drugs: none Previous cardioversions: 2019, 01/04/21 Previous ablations: 04/23/19 CHADS2VASC score: 4 Anticoagulation history: Eliquis   Past Medical History:  Diagnosis Date  . ALLERGIC RHINITIS 01/25/2010  . Arthritis    "neck, back, shoulders," (02/20/2017)  . Chronic lower back pain   . High cholesterol   . Hx of cardiovascular stress test    a. Lexiscan Myoview 02/06/13:  EF 59%, no ischemia or scar  . Hx of echocardiogram    a.  Echo 02/03/13:  Mild LVH, mild focal basal septal hypertrophy, EF 50-55%, Gr 1 DD, mild LAE  . HYPERTENSION 01/25/2010  . HYPERTHYROIDISM 01/25/2010   S/P radioactive iodine "back in the 1980s" (02/20/2017)  . Impaired glucose tolerance   . 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 Obesity   . OSA on CPAP    Uses CPAP nightly  . Pericarditis 1980's  . 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  Marland Kitchen Vitiligo    Past Surgical History:  Procedure Laterality Date  . A-FLUTTER ABLATION N/A 04/23/2019   Procedure: A-FLUTTER ABLATION;  Surgeon: Deboraha Sprang, MD;  Location: [facility];  Service: Cardiovascular;  Laterality: N/A;  . BIOPSY  09/29/2019   Procedure: BIOPSY;  Surgeon: Thornton Park, MD;  Location: [place];  Service: Gastroenterology;;  . CARDIOVERSION N/A 08/14/2018   Procedure: CARDIOVERSION;  Surgeon: [name], MD;  Location: [place];  Service: Cardiovascular;  Laterality:  N/A;  . CARDIOVERSION N/A 01/04/2021   Procedure: CARDIOVERSION;  Surgeon: Pixie Casino, MD;  Location: [facility] ENDOSCOPY;  Service: Cardiovascular;  Laterality: N/A;  . ESOPHAGOGASTRODUODENOSCOPY N/A 09/29/2019   Procedure: ESOPHAGOGASTRODUODENOSCOPY (EGD);  Surgeon: Thornton Park, MD;  Location: [place];  Service: Gastroenterology;  Laterality: N/A;  . INGUINAL HERNIA REPAIR Bilateral   . LUMBAR LAMINECTOMY    . TEE WITHOUT CARDIOVERSION N/A 08/14/2018   Procedure: TRANSESOPHAGEAL ECHOCARDIOGRAM (TEE);  Surgeon: [name], MD;  Location: [facility] ENDOSCOPY;  Service: Cardiovascular;  Laterality: N/A;  . UMBILICAL HERNIA REPAIR      Current Outpatient Medications  Medication Sig Dispense Refill  . acetaminophen (TYLENOL) 500 MG tablet Take 1,000 mg by mouth every 6 (six) hours as needed for moderate pain.    . Aloe-Sodium Chloride (AYR SALINE NASAL GEL NA) Place 1 application into the nose at bedtime as needed (congestion).    Marland Kitchen apixaban (ELIQUIS) 5 MG TABS tablet Take 1 tablet (5 mg total) by mouth 2 (two) times daily. 180 tablet 3  . diltiazem (CARDIZEM CD) 120 MG 24 hr capsule TAKE 1 CAPSULE BY MOUTH EVERY DAY 90 capsule 3  . doxazosin (CARDURA) 2 MG tablet TAKE 1 TABLET (2 MG TOTAL) BY MOUTH AT BEDTIME. 90 tablet 2  . finasteride (PROSCAR) 5 MG tablet Take 5 mg by mouth daily.    . hydrocortisone cream 1 % Apply 1 application topically 2 (two) times daily as needed for itching.    . losartan (COZAAR) 50 MG tablet TAKE 1 TABLET BY MOUTH EVERY DAY 90 tablet 1  . metoprolol succinate (TOPROL-XL) 100 MG 24 hr tablet TAKE 1 TABLET (100 MG TOTAL) BY MOUTH AT BEDTIME. TAKE WITH OR IMMEDIATELY FOLLOWING A MEAL. 90 tablet 3  . Multiple Vitamin (MULTIVITAMIN WITH MINERALS) TABS tablet Take 1 tablet by mouth 3 (three) times a week.    Marland Kitchen OVER THE COUNTER MEDICATION Take 1 tablet by mouth daily. OTC - Super Beta Prostate    . oxyCODONE (OXY IR/ROXICODONE) 5 MG immediate release tablet Take 5 mg  by mouth every 6 (six) hours as needed.    . pravastatin (PRAVACHOL) 40 MG tablet TAKE 1 TABLET BY MOUTH EVERYDAY AT BEDTIME 90 tablet 3  . PRESCRIPTION MEDICATION Inhale into the lungs at bedtime. CPAP    . sildenafil (VIAGRA) 100 MG tablet Take 100 mg by mouth daily as needed for erectile dysfunction.     . vitamin C (ASCORBIC ACID) 500 MG tablet Take 500 mg by mouth 3 (three) times a week.     No current facility-administered medications for this encounter.    Allergies  Allergen Reactions  . Flomax [Tamsulosin Hcl] Itching  . Relpax [Eletriptan] Other (See Comments) and Hypertension    Chest pain and sweating  . Other Palpitations    Any steroids cause heart palpitations  (tolerates low doses)    Social History   Socioeconomic  History  . Marital status: Married    Spouse name: Not on file  . Number of children: 2  . Years of education: Not on file  . Highest education level: Not on file  Occupational History  . Occupation: Press photographer  . Occupation: retired  Tobacco Use  . Smoking status: Former Smoker    Packs/day: 0.50    Years: 8.00    Pack years: 4.00    Types: Cigarettes    Quit date: 1968    Years since quitting: 54.2  . Smokeless tobacco: Never Used  Vaping Use  . Vaping Use: Never used  Substance and Sexual Activity  . Alcohol use: Yes    Alcohol/week: 1.0 - 2.0 standard drink    Types: 1 - 2 Shots of liquor per week    Comment: 02/20/2017 "couple drinks/month on average"  . Drug use: No  . Sexual activity: Yes  Other Topics Concern  . Not on file  Social History Narrative  . Not on file   Social Determinants of Health   Financial Resource Strain: Low Risk   . Difficulty of Paying Living Expenses: Not hard at all  Food Insecurity: No Food Insecurity  . Worried About Charity fundraiser in the Last Year: Never true  . Ran Out of Food in the Last Year: Never true  Transportation Needs: No Transportation Needs  . Lack of Transportation (Medical): No  .  Lack of Transportation (Non-Medical): No  Physical Activity: Insufficiently Active  . Days of Exercise per Week: 2 days  . Minutes of Exercise per Session: 30 min  Stress: No Stress Concern Present  . Feeling of Stress : Not at all  Social Connections: Moderately Isolated  . Frequency of Communication with Friends and Family: More than three times a week  . Frequency of Social Gatherings with Friends and Family: More than three times a week  . Attends Religious Services: Never  . Active Member of Clubs or Organizations: No  . Attends Archivist Meetings: Never  . Marital Status: Married  Human resources officer Violence: Not At Risk  . Fear of Current or Ex-Partner: No  . Emotionally Abused: No  . Physically Abused: No  . Sexually Abused: No     ROS- All systems are reviewed and negative except as per the HPI above.  Physical Exam: Vitals:   01/11/21 1011  BP: 120/82  Pulse: 89  Weight: 125.6 kg  Height: 6\' 2"  (1.88 m)    GEN- The patient is a well appearing obese male, alert and oriented x 3 today.   HEENT-head normocephalic, atraumatic, sclera clear, conjunctiva pink, hearing intact, trachea midline. Lungs- Clear to ausculation bilaterally, normal work of breathing Heart- irregular rate and rhythm, no murmurs, rubs or gallops  GI- soft, NT, ND, + BS Extremities- no clubbing, cyanosis, or edema MS- no significant deformity or atrophy Skin- no rash or lesion Psych- euthymic mood, full affect Neuro- strength and sensation are intact   Wt Readings from Last 3 Encounters:  01/11/21 125.6 kg  01/04/21 120.2 kg  12/28/20 126.1 kg    EKG today demonstrates  Atypical atrial flutter with variable block Vent. rate 89 BPM PR interval * ms QRS duration 90 ms QT/QTc 370/450 ms  Echo 08/13/18 demonstrated  - Left ventricle: The cavity size was normal. Wall thickness was  increased in a pattern of mild LVH. There was mild focal basal  hypertrophy of the septum.  Systolic function was normal. The  estimated ejection  fraction was in the range of 55% to 60%. Wall  motion was normal; there were no regional wall motion  abnormalities. The study is not technically sufficient to allow  evaluation of LV diastolic function.  - Left atrium: The atrium was moderately dilated.  - Pulmonary arteries: PA peak pressure: 34 mm Hg (S).   Impressions:   - Normal LV function; mild LVH; moderate LAE; cannot R/O left  atrial mass; suggest TEE to further assess.   Epic records are reviewed at length today  CHA2DS2-VASc Score = 5  The patient's score is based upon: CHF History: Yes HTN History: Yes Diabetes History: No Stroke History: Yes Vascular Disease History: No Age Score: 1 Gender Score: 0      ASSESSMENT AND PLAN: 1. Persistent Atrial Fibrillation/atrial flutter The patient's CHA2DS2-VASc score is 5, indicating a 7.2% annual risk of stroke. S/p DCCV on 01/04/21, unfortunately he is back in atrial flutter today. He is interested in ablation. He does have a long 1st degree AV block at baseline which limits some AAD options. Could consider dofetilide if he is not felt to be an ablation candidate.  Continue Eliquis 5 mg BID Check echocardiogram to evaluate LA. Continue Toprol 100 mg daily Continue diltiazem 120 mg daily  2. Secondary Hypercoagulable State (ICD10:  D68.69) The patient is at significant risk for stroke/thromboembolism based upon his CHA2DS2-VASc Score of 5.  Continue Apixaban (Eliquis).   3. Obesity Body mass index is 35.54 kg/m. Lifestyle modification was discussed and encouraged including regular physical activity and weight reduction.  4. Obstructive sleep apnea Patient reports compliance with CPAP therapy.  5. HTN Stable, no changes today.   Follow up with EP to discuss ablation.    [facility] [address] [place], [place] [address] [phone] 01/11/2021 10:20 AM

## 2021-01-11 ENCOUNTER — Encounter (HOSPITAL_COMMUNITY): Payer: Self-pay | Admitting: Physician Assistant

## 2021-01-11 ENCOUNTER — Ambulatory Visit (HOSPITAL_COMMUNITY)
Admission: RE | Admit: 2021-01-11 | Discharge: 2021-01-11 | Disposition: A | Discharge status: Home / Self Care | Payer: Medicare HMO | Source: Ambulatory Visit | Attending: Physician Assistant | Admitting: Physician Assistant

## 2021-01-11 ENCOUNTER — Other Ambulatory Visit: Payer: Self-pay

## 2021-01-11 VITALS — BP 120/82 | HR 89 | Ht 74.0 in | Wt 276.8 lb

## 2021-01-11 DIAGNOSIS — Z7901 Long term (current) use of anticoagulants: Secondary | ICD-10-CM | POA: Diagnosis not present

## 2021-01-11 DIAGNOSIS — D6869 Other thrombophilia: Secondary | ICD-10-CM | POA: Diagnosis not present

## 2021-01-11 DIAGNOSIS — Z87891 Personal history of nicotine dependence: Secondary | ICD-10-CM | POA: Insufficient documentation

## 2021-01-11 DIAGNOSIS — E669 Obesity, unspecified: Secondary | ICD-10-CM | POA: Diagnosis not present

## 2021-01-11 DIAGNOSIS — I10 Essential (primary) hypertension: Secondary | ICD-10-CM | POA: Insufficient documentation

## 2021-01-11 DIAGNOSIS — Z8673 Personal history of transient ischemic attack (TIA), and cerebral infarction without residual deficits: Secondary | ICD-10-CM | POA: Insufficient documentation

## 2021-01-11 DIAGNOSIS — G4733 Obstructive sleep apnea (adult) (pediatric): Secondary | ICD-10-CM | POA: Diagnosis not present

## 2021-01-11 DIAGNOSIS — Z6835 Body mass index (BMI) 35.0-35.9, adult: Secondary | ICD-10-CM | POA: Diagnosis not present

## 2021-01-11 DIAGNOSIS — Z79899 Other long term (current) drug therapy: Secondary | ICD-10-CM | POA: Diagnosis not present

## 2021-01-11 DIAGNOSIS — I4892 Unspecified atrial flutter: Secondary | ICD-10-CM | POA: Diagnosis not present

## 2021-01-11 DIAGNOSIS — I4819 Other persistent atrial fibrillation: Secondary | ICD-10-CM

## 2021-01-11 DIAGNOSIS — I4891 Unspecified atrial fibrillation: Secondary | ICD-10-CM | POA: Diagnosis not present

## 2021-01-11 MED ORDER — APIXABAN 5 MG PO TABS: 5.0000 mg | ORAL_TABLET | Freq: Two times a day (BID) | ORAL | 2 refills | Status: DC

## 2021-01-13 ENCOUNTER — Ambulatory Visit: Payer: Medicare HMO | Admitting: Adult Health

## 2021-01-13 ENCOUNTER — Ambulatory Visit: Payer: Medicare HMO | Admitting: Pulmonary Disease

## 2021-01-17 ENCOUNTER — Encounter: Payer: Self-pay | Admitting: Adult Health

## 2021-01-17 ENCOUNTER — Ambulatory Visit: Payer: Medicare HMO | Admitting: Adult Health

## 2021-01-17 ENCOUNTER — Other Ambulatory Visit: Payer: Self-pay

## 2021-01-17 ENCOUNTER — Encounter: Payer: Self-pay | Admitting: Cardiology

## 2021-01-17 VITALS — BP 130/80 | HR 89 | Temp 97.2°F | Ht 74.0 in | Wt 275.2 lb

## 2021-01-17 DIAGNOSIS — G4733 Obstructive sleep apnea (adult) (pediatric): Secondary | ICD-10-CM | POA: Diagnosis not present

## 2021-01-17 NOTE — Progress Notes (Signed)
@Patient  ID: Gordon Phillips, male    DOB: 04/02/1946, 75 y.o.   MRN: [id number]  Chief Complaint  Patient presents with  . Follow-up    Referring provider: Vivi Barrack, MD  HPI: 75 year old male followed for obstructive sleep apnea Medical history significant for A. fib on Eliquis with previous ablation History of acidophilic esophagitis 3536  TEST/EVENTS :   6/2015Mod- AHI 28/h-CPAP 11 cm  01/17/2021 Follow up ; OSA  Patient returns for a 1 year follow-up.  Patient has underlying sleep apnea.  He is on nocturnal CPAP.  Patient recently got a new CPAP machine.  Unfortunately patient says he has been unable to tolerate the new machine.  He felt that something was wrong with that he took it back to the local homecare company but states that everything appears to be in order.  Patient is on CPAP 11 cm H2O.  Patient says he is recently went back to using his older CPAP machine.  He says he cannot sleep without his CPAP.  He feels rested while he wears it each night.  Says he cannot go without it.  Patient says his new machine feels like the CPAP pressure is too high. CPAP download which is on his old machine shows excellent 100% compliance.  Daily average usage is 7 hours.  Patient is on CPAP 11 cm H2O.  AHI 0.7. Patient uses a full facemask.   Allergies  Allergen Reactions  . Flomax [Tamsulosin Hcl] Itching  . Relpax [Eletriptan] Other (See Comments) and Hypertension    Chest pain and sweating  . Other Palpitations    Any steroids cause heart palpitations  (tolerates low doses)    Immunization History  Administered Date(s) Administered  . Fluad Quad(high Dose 65+) 08/26/2019, 09/07/2020  . Influenza Split 08/10/2011, 08/21/2012  . Influenza, High Dose Seasonal PF 08/24/2017  . Influenza,inj,Quad PF,6+ Mos 08/07/2013, 08/10/2014, 08/12/2018  . Influenza-Unspecified 06/20/2016  . PFIZER(Purple Top)SARS-COV-2 Vaccination 12/18/2019, 01/12/2020, 10/14/2020  . Pneumococcal  Conjugate-13 08/10/2014  . Pneumococcal Polysaccharide-23 03/20/2016  . Tdap 12/14/2014    Past Medical History:  Diagnosis Date  . ALLERGIC RHINITIS 01/25/2010  . Arthritis    "neck, back, shoulders," (02/20/2017)  . Chronic lower back pain   . High cholesterol   . Hx of cardiovascular stress test    a. Lexiscan Myoview 02/06/13:  EF 59%, no ischemia or scar  . Hx of echocardiogram    a.  Echo 02/03/13:  Mild LVH, mild focal basal septal hypertrophy, EF 50-55%, Gr 1 DD, mild LAE  . HYPERTENSION 01/25/2010  . HYPERTHYROIDISM 01/25/2010   S/P radioactive iodine "back in the 1980s" (02/20/2017)  . Impaired glucose tolerance   . 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 Obesity   . OSA on CPAP    Uses CPAP nightly  . Pericarditis 1980's  . 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  Marland Kitchen Vitiligo     Tobacco History: Social History   Tobacco Use  Smoking Status Former Smoker  . Packs/day: 0.50  . Years: 8.00  . Pack years: 4.00  . Types: Cigarettes  . Quit date: 85  . Years since quitting: 54.2  Smokeless Tobacco Never Used   Counseling given: Not Answered   Outpatient Medications Prior to Visit  Medication Sig Dispense Refill  . acetaminophen (TYLENOL) 500 MG tablet Take 1,000 mg by mouth every 6 (six) hours as needed for moderate pain.    Marland Kitchen  Aloe-Sodium Chloride (AYR SALINE NASAL GEL NA) Place 1 application into the nose at bedtime as needed (congestion).    Marland Kitchen apixaban (ELIQUIS) 5 MG TABS tablet Take 1 tablet (5 mg total) by mouth 2 (two) times daily. 180 tablet 2  . diltiazem (CARDIZEM CD) 120 MG 24 hr capsule TAKE 1 CAPSULE BY MOUTH EVERY DAY 90 capsule 3  . finasteride (PROSCAR) 5 MG tablet Take 5 mg by mouth daily.    . hydrocortisone cream 1 % Apply 1 application topically 2 (two) times daily as needed for itching.    . losartan (COZAAR) 50 MG tablet TAKE 1 TABLET BY MOUTH EVERY DAY 90 tablet 1  . metoprolol  succinate (TOPROL-XL) 100 MG 24 hr tablet TAKE 1 TABLET (100 MG TOTAL) BY MOUTH AT BEDTIME. TAKE WITH OR IMMEDIATELY FOLLOWING A MEAL. 90 tablet 3  . Multiple Vitamin (MULTIVITAMIN WITH MINERALS) TABS tablet Take 1 tablet by mouth 3 (three) times a week.    Marland Kitchen OVER THE COUNTER MEDICATION Take 1 tablet by mouth daily. OTC - Super Beta Prostate    . pravastatin (PRAVACHOL) 40 MG tablet TAKE 1 TABLET BY MOUTH EVERYDAY AT BEDTIME 90 tablet 3  . PRESCRIPTION MEDICATION Inhale into the lungs at bedtime. CPAP    . sildenafil (VIAGRA) 100 MG tablet Take 100 mg by mouth daily as needed for erectile dysfunction.     . vitamin C (ASCORBIC ACID) 500 MG tablet Take 500 mg by mouth 3 (three) times a week.    . doxazosin (CARDURA) 2 MG tablet TAKE 1 TABLET (2 MG TOTAL) BY MOUTH AT BEDTIME. 90 tablet 2  . oxyCODONE (OXY IR/ROXICODONE) 5 MG immediate release tablet Take 5 mg by mouth every 6 (six) hours as needed. (Patient not taking: Reported on 01/17/2021)     No facility-administered medications prior to visit.     Review of Systems:   Constitutional:   No  weight loss, night sweats,  Fevers, chills, fatigue, or  lassitude.  HEENT:   No headaches,  Difficulty swallowing,  Tooth/dental problems, or  Sore throat,                No sneezing, itching, ear ache, nasal congestion, post nasal drip,   CV:  No chest pain,  Orthopnea, PND, swelling in lower extremities, anasarca, dizziness, palpitations, syncope.   GI  No heartburn, indigestion, abdominal pain, nausea, vomiting, diarrhea, change in bowel habits, loss of appetite, bloody stools.   Resp: No shortness of breath with exertion or at rest.  No excess mucus, no productive cough,  No non-productive cough,  No coughing up of blood.  No change in color of mucus.  No wheezing.  No chest wall deformity  Skin: no rash or lesions.  GU: no dysuria, change in color of urine, no urgency or frequency.  No flank pain, no hematuria   MS:  No joint pain or  swelling.  No decreased range of motion.  No back pain.    Physical Exam  BP 130/80 (BP Location: Left Arm, Patient Position: Sitting, Cuff Size: Large)   Pulse 89   Temp (!) 97.2 F (36.2 C) (Temporal)   Ht 6\' 2"  (1.88 m)   Wt 275 lb 3.2 oz (124.8 kg)   SpO2 97%   BMI 35.33 kg/m   GEN: A/Ox3; pleasant , NAD, well nourished    HEENT:  [place]/AT,    NOSE-clear, THROAT-clear, no lesions, no postnasal drip or exudate noted.   NECK:  Supple w/  fair ROM; no JVD; normal carotid impulses w/o bruits; no thyromegaly or nodules palpated; no lymphadenopathy.    RESP  Clear  P & A; w/o, wheezes/ rales/ or rhonchi. no accessory muscle use, no dullness to percussion  CARD:  RRR, no m/r/g, no peripheral edema, pulses intact, no cyanosis or clubbing.  GI:   Soft & nt; nml bowel sounds; no organomegaly or masses detected.   Musco: Warm bil, no deformities or joint swelling noted.   Neuro: Phillips, no focal deficits noted.    Skin: Warm, vitiligo skin changes noted    Lab Results:  CBC  BMET   BNP No results found for: BNP  ProBNP Imaging: DG Chest 2 View  Result Date: 12/21/2020 CLINICAL DATA:  Chest pain EXAM: CHEST - 2 VIEW COMPARISON:  09/29/2019 FINDINGS: The heart size and mediastinal contours are within normal limits. Both lungs are clear. The visualized skeletal structures are unremarkable. IMPRESSION: No active cardiopulmonary disease. Electronically Signed   By: Donavan Foil M.D.   On: 12/21/2020 20:01      No flowsheet data found.  No results found for: NITRICOXIDE      Assessment & Plan:   OSA (obstructive sleep apnea) Excellent control and compliance on nocturnal CPAP. Unfortunately patient is not tolerating his new CPAP machine.  Questionable etiology as it is the same pressure as his old machine.  Will adjust CPAP pressure to 5-15 on his new machine. Check CPAP download in 4 weeks.   Plan  Patient Instructions  Change CPAP pressure to 5 to 15 cmH2O.   Continue on CPAP At bedtime   Work on healthy weight  Do not  Drive if sleepy  CPAP download in 4 weeks  Follow up in 6 months and As needed  With Dr. Elsworth Soho       Morbid obesity [facility]) Healthy Weight loss       Rexene Edison, NP 01/17/2021

## 2021-01-17 NOTE — Assessment & Plan Note (Signed)
Healthy Weight loss  

## 2021-01-17 NOTE — Assessment & Plan Note (Addendum)
Excellent control and compliance on nocturnal CPAP. Unfortunately patient is not tolerating his new CPAP machine.  Questionable etiology as it is the same pressure as his old machine.  Will adjust CPAP pressure to 5-15 on his new machine. Check CPAP download in 4 weeks.   Plan  Patient Instructions  Change CPAP pressure to 5 to 15 cmH2O.  Continue on CPAP At bedtime   Work on healthy weight  Do not  Drive if sleepy  CPAP download in 4 weeks  Follow up in 6 months and As needed  With Dr. Elsworth Soho

## 2021-01-17 NOTE — Patient Instructions (Signed)
Change CPAP pressure to 5 to 15 cmH2O.  Continue on CPAP At bedtime   Work on healthy weight  Do not  Drive if sleepy  CPAP download in 4 weeks  Follow up in 6 months and As needed  With Dr. Elsworth Soho

## 2021-02-07 ENCOUNTER — Other Ambulatory Visit: Payer: Self-pay

## 2021-02-07 ENCOUNTER — Ambulatory Visit (HOSPITAL_COMMUNITY)
Admission: RE | Admit: 2021-02-07 | Discharge: 2021-02-07 | Disposition: A | Discharge status: Home / Self Care | Payer: Medicare HMO | Source: Ambulatory Visit | Attending: Physician Assistant | Admitting: Physician Assistant

## 2021-02-07 DIAGNOSIS — E785 Hyperlipidemia, unspecified: Secondary | ICD-10-CM | POA: Diagnosis not present

## 2021-02-07 DIAGNOSIS — G4730 Sleep apnea, unspecified: Secondary | ICD-10-CM | POA: Diagnosis not present

## 2021-02-07 DIAGNOSIS — Z6835 Body mass index (BMI) 35.0-35.9, adult: Secondary | ICD-10-CM | POA: Insufficient documentation

## 2021-02-07 DIAGNOSIS — I4819 Other persistent atrial fibrillation: Secondary | ICD-10-CM | POA: Diagnosis not present

## 2021-02-07 DIAGNOSIS — I119 Hypertensive heart disease without heart failure: Secondary | ICD-10-CM | POA: Insufficient documentation

## 2021-02-07 DIAGNOSIS — E669 Obesity, unspecified: Secondary | ICD-10-CM | POA: Diagnosis not present

## 2021-02-07 LAB — ECHOCARDIOGRAM COMPLETE
Area-P 1/2: 4.6 cm2
S' Lateral: 3.1 cm

## 2021-02-07 NOTE — Progress Notes (Signed)
  Echocardiogram 2D Echocardiogram has been performed.  Gordon Phillips 02/07/2021, 11:40 AM

## 2021-02-15 ENCOUNTER — Other Ambulatory Visit: Payer: Self-pay

## 2021-02-15 ENCOUNTER — Encounter: Payer: Self-pay | Admitting: Cardiology

## 2021-02-15 ENCOUNTER — Inpatient Hospital Stay: Payer: Medicare HMO | Attending: Hematology

## 2021-02-15 ENCOUNTER — Ambulatory Visit: Payer: Medicare HMO | Admitting: Cardiology

## 2021-02-15 VITALS — BP 158/68 | HR 71 | Ht 74.0 in | Wt 275.0 lb

## 2021-02-15 DIAGNOSIS — C9000 Multiple myeloma not having achieved remission: Secondary | ICD-10-CM | POA: Insufficient documentation

## 2021-02-15 DIAGNOSIS — I4819 Other persistent atrial fibrillation: Secondary | ICD-10-CM

## 2021-02-15 DIAGNOSIS — D472 Monoclonal gammopathy: Secondary | ICD-10-CM

## 2021-02-15 LAB — CBC WITH DIFFERENTIAL (CANCER CENTER ONLY)
Abs Immature Granulocytes: 0.01 10*3/uL (ref 0.00–0.07)
Basophils Absolute: 0 10*3/uL (ref 0.0–0.1)
Basophils Relative: 1 %
Eosinophils Absolute: 0.1 10*3/uL (ref 0.0–0.5)
Eosinophils Relative: 2 %
HCT: 39.7 % (ref 39.0–52.0)
Hemoglobin: 13.4 g/dL (ref 13.0–17.0)
Immature Granulocytes: 0 %
Lymphocytes Relative: 37 %
Lymphs Abs: 2.2 10*3/uL (ref 0.7–4.0)
MCH: 30.8 pg (ref 26.0–34.0)
MCHC: 33.8 g/dL (ref 30.0–36.0)
MCV: 91.3 fL (ref 80.0–100.0)
Monocytes Absolute: 0.4 10*3/uL (ref 0.1–1.0)
Monocytes Relative: 7 %
Neutro Abs: 3.2 10*3/uL (ref 1.7–7.7)
Neutrophils Relative %: 53 %
Platelet Count: 143 10*3/uL — ABNORMAL LOW (ref 150–400)
RBC: 4.35 MIL/uL (ref 4.22–5.81)
RDW: 13.2 % (ref 11.5–15.5)
WBC Count: 5.9 10*3/uL (ref 4.0–10.5)
nRBC: 0 % (ref 0.0–0.2)

## 2021-02-15 LAB — CMP (CANCER CENTER ONLY)
ALT: 12 U/L (ref 0–44)
AST: 17 U/L (ref 15–41)
Albumin: 4.1 g/dL (ref 3.5–5.0)
Alkaline Phosphatase: 57 U/L (ref 38–126)
Anion gap: 9 (ref 5–15)
BUN: 15 mg/dL (ref 8–23)
CO2: 27 mmol/L (ref 22–32)
Calcium: 9.4 mg/dL (ref 8.9–10.3)
Chloride: 106 mmol/L (ref 98–111)
Creatinine: 1.01 mg/dL (ref 0.61–1.24)
GFR, Estimated: >60 mL/min (ref >60)
Glucose, Bld: 111 mg/dL — ABNORMAL HIGH (ref 70–99)
Potassium: 4 mmol/L (ref 3.5–5.1)
Sodium: 142 mmol/L (ref 135–145)
Total Bilirubin: 1.1 mg/dL (ref 0.3–1.2)
Total Protein: 7.4 g/dL (ref 6.5–8.1)

## 2021-02-15 NOTE — Patient Instructions (Signed)
Medication Instructions:  Your physician recommends that you continue on your current medications as directed. Please refer to the Current Medication list given to you today.  *If you need a refill on your cardiac medications before your next appointment, please call your pharmacy*   Lab Work: None ordered If you have labs (blood work) drawn today and your tests are completely normal, you will receive your results only by: Marland Kitchen MyChart Message (if you have MyChart) OR . A paper copy in the mail If you have any lab test that is abnormal or we need to change your treatment, we will call you to review the results.   Testing/Procedures: Your physician has requested that you have cardiac CT within 7 days PRIOR to ablation. Cardiac computed tomography (CT) is a painless test that uses an x-ray machine to take clear, detailed pictures of your heart.  Please follow instruction sheet below under "other instructions".    Your physician has recommended that you have an ablation. Catheter ablation is a medical procedure used to treat some cardiac arrhythmias (irregular heartbeats). During catheter ablation, a long, thin, flexible tube is put into a blood vessel in your groin (upper thigh), or neck. This tube is called an ablation catheter. It is then guided to your heart through the blood vessel. Radio frequency waves destroy small areas of heart tissue where abnormal heartbeats may cause an arrhythmia to start. Please follow instruction sheet below under "other instructions".    Follow-Up: At [facility], you and your health needs are our priority.  As part of our continuing mission to provide you with exceptional heart care, we have created designated Provider Care Teams.  These Care Teams include your primary Cardiologist (physician) and Advanced Practice Providers (APPs -  Physician Assistants and Nurse Practitioners) who all work together to provide you with the care you need, when you need  it.  Your next appointment:   4 week(s) after your ablation  The format for your next appointment:   In Person  Provider:   Roderic Palau NP or Adline Peals PA-C   Please call the office or send a mychart message and let us know what your preference is.... medication vs. ablation  Thank you for choosing [facility]!!   Trinidad Curet, RN [phone]   Other Instructions  CT INSTRUCTIONS Your cardiac CT will be scheduled at:  [facility] [address] [place], [place] [address] [phone]  Please arrive at the [facility] main entrance of [facility] 30 minutes prior to test start time. Proceed to the [facility] Radiology Department (first floor) to check-in and test prep.  Please follow these instructions carefully (unless otherwise directed):  Hold all erectile dysfunction medications at least 3 days (72 hrs) prior to test.  On the Night Before the Test: . Be sure to Drink plenty of water. . Do not consume any caffeinated/decaffeinated beverages or chocolate 12 hours prior to your test. . Do not take any antihistamines 12 hours prior to your test.  On the Day of the Test: . Drink plenty of water. Do not drink any water within one hour of the test. . Do not eat any food 4 hours prior to the test. . You may take your regular medications prior to the test.  . Take metoprolol (Lopressor) 50 mg two hours prior to test. . HOLD Furosemide/Hydrochlorothiazide morning of the test.      After the Test: . Drink plenty of water. . After receiving  IV contrast, you may experience a mild flushed feeling. This is normal. . On occasion, you may experience a mild rash up to 24 hours after the test. This is not dangerous. If this occurs, you can take Benadryl 25 mg and increase your fluid intake. . If you experience trouble breathing, this can be serious. If it is severe call 911 IMMEDIATELY. If it is mild, please call our office. . If you take any of  these medications: Glipizide/Metformin, Avandament, Glucavance, please do not take 48 hours after completing test unless otherwise instructed.   Once we have confirmed authorization from your insurance company, we will call you to set up a date and time for your test. Based on how quickly your insurance processes prior authorizations requests, please allow up to 4 weeks to be contacted for scheduling your Cardiac CT appointment. Be advised that routine Cardiac CT appointments could be scheduled as many as 8 weeks after your provider has ordered it.  For non-scheduling related questions, please contact the cardiac imaging nurse navigator should you have any questions/concerns: Marchia Bond, Cardiac Imaging Nurse Navigator Burley Saver, Interim Cardiac Imaging Nurse [place] [facility] V[facility] Services Direct Office Dial: [phone]   For scheduling needs, including cancellations and rescheduling, please call [place], [phone].     Electrophysiology/Ablation Procedure Instructions   You are scheduled for a(n)  ablation on ___________ with Dr. Allegra Lai.   1.   Pre procedure testing-             A.  LAB WORK --- On ____________  for your pre procedure blood work.  You do NOT need to be fasting.               B. COVID TEST-- On ____________ @ _____________ - This is a Drive Up Visit at [address] [place], [place] [address].  Someone will direct you to the appropriate testing line. Stay in your car and someone will be with you shortly.   After you are tested please go home and self quarantine until the day of your procedure.     PROCEDURE DAY: 2. On the day of your procedure _____________ you will go to [facility] hospital [phone] [employer]) at ___________.  You will go to the main entrance A [employer]) and enter where the [employer] parking staff are.  Your driver will drop you off and you will head down the hallway to ADMITTING.  You may have one support person come in  to the hospital with you.  They will be asked to wait in the waiting room.  It is OK to have someone drop you off and come back when you are ready to be discharged.   3.   Do not eat or drink after midnight prior to your procedure.   4.   Do not miss any doses of your blood thinner prior to the morning of your procedure or your procedure will need to be rescheduled.       Do NOT take any medications the morning of your procedure.   5.  Plan for an overnight stay, but you may be discharged home after your procedure. If you use your phone frequently bring your phone charger, in case you have to stay.  If you are discharged after your procedure you will need someone to drive you home and be with your for 24 hours after your procedure.   6. You will follow up with the AFIB clinic 4 weeks  after your procedure.  You will follow up with Dr. Curt Bears  3 months after your procedure.  These appointments will be made for you.   * If you have ANY questions please call the office [phone] and ask for Loella Hickle RN or send me a MyChart message   * Occasionally, EP Studies and ablations can become lengthy.  Please make your family aware of this before your procedure starts.  Average time ranges from 2-8 hours for EP studies/ablations.  Your physician will call your family after the procedure with the results.                                    AFIB CLINIC INFORMATION: Your appointment is scheduled on: __________ at ___________. Please arrive 15 minutes early for check-in. The AFib Clinic is located in the Heart and Vascular Specialty Clinics at [facility]. Parking instructions/directions: [address] (off [address]). When you pull in to Entrance C, there is an underground parking garage to your right. The code to enter the garage is ___________. Take the elevators to the first floor. Follow the signs to the Heart and Vascular Specialty Clinics. You will see registration at the end of the hallway.   Phone number: [phone]     Cardiac electrophysiology: From cell to bedside (7th ed., pp. 3976-7341). [place], [place]: Elsevier.">  Cardiac Ablation Cardiac ablation is a procedure to destroy, or ablate, a small amount of heart tissue in very specific places. The heart has many electrical connections. Sometimes these connections are abnormal and can cause the heart to beat very fast or irregularly. Ablating some of the areas that cause problems can improve the heart's rhythm or return it to normal. Ablation may be done for people who:  Have Wolff-Parkinson-White syndrome.  Have fast heart rhythms (tachycardia).  Have taken medicines for an abnormal heart rhythm (arrhythmia) that were not effective or caused side effects.  Have a high-risk heartbeat that may be life-threatening. During the procedure, a small incision is made in the neck or the groin, and a long, thin tube (catheter) is inserted into the incision and moved to the heart. Small devices (electrodes) on the tip of the catheter will send out electrical currents. A type of X-ray (fluoroscopy) will be used to help guide the catheter and to provide images of the heart. Tell a health care provider about:  Any allergies you have.  All medicines you are taking, including vitamins, herbs, eye drops, creams, and over-the-counter medicines.  Any problems you or family members have had with anesthetic medicines.  Any blood disorders you have.  Any surgeries you have had.  Any medical conditions you have, such as kidney failure.  Whether you are pregnant or may be pregnant. What are the risks? Generally, this is a safe procedure. However, problems may occur, including:  Infection.  Bruising and bleeding at the catheter insertion site.  Bleeding into the chest, especially into the sac that surrounds the heart. This is a serious complication.  Stroke or blood clots.  Damage to nearby structures or organs.  Allergic  reaction to medicines or dyes.  Need for a permanent pacemaker if the normal electrical system is damaged. A pacemaker is a small computer that sends electrical signals to the heart and helps your heart beat normally.  The procedure not being fully effective. This may not be recognized until months later. Repeat ablation procedures are  sometimes done. What happens before the procedure? Medicines Ask your health care provider about:  Changing or stopping your regular medicines. This is especially important if you are taking diabetes medicines or blood thinners.  Taking medicines such as aspirin and ibuprofen. These medicines can thin your blood. Do not take these medicines unless your health care provider tells you to take them.  Taking over-the-counter medicines, vitamins, herbs, and supplements. General instructions  Follow instructions from your health care provider about eating or drinking restrictions.  Plan to have someone take you home from the hospital or clinic.  If you will be going home right after the procedure, plan to have someone with you for 24 hours.  Ask your health care provider what steps will be taken to prevent infection. What happens during the procedure?  An IV will be inserted into one of your veins.  You will be given a medicine to help you relax (sedative).  The skin on your neck or groin will be numbed.  An incision will be made in your neck or your groin.  A needle will be inserted through the incision and into a large vein in your neck or groin.  A catheter will be inserted into the needle and moved to your heart.  Dye may be injected through the catheter to help your surgeon see the area of the heart that needs treatment.  Electrical currents will be sent from the catheter to ablate heart tissue in desired areas. There are three types of energy that may be used to do this: ? Heat (radiofrequency energy). ? Laser energy. ? Extreme cold  (cryoablation).  When the tissue has been ablated, the catheter will be removed.  Pressure will be held on the insertion area to prevent a lot of bleeding.  A bandage (dressing) will be placed over the insertion area. The exact procedure may vary among health care providers and hospitals.   What happens after the procedure?  Your blood pressure, heart rate, breathing rate, and blood oxygen level will be monitored until you leave the hospital or clinic.  Your insertion area will be monitored for bleeding. You will need to lie still for a few hours to ensure that you do not bleed from the insertion area.  Do not drive for 24 hours or as long as told by your health care provider. Summary  Cardiac ablation is a procedure to destroy, or ablate, a small amount of heart tissue using an electrical current. This procedure can improve the heart rhythm or return it to normal.  Tell your health care provider about any medical conditions you may have and all medicines you are taking to treat them.  This is a safe procedure, but problems may occur. Problems may include infection, bruising, damage to nearby organs or structures, or allergic reactions to medicines.  Follow your health care provider's instructions about eating and drinking before the procedure. You may also be told to change or stop some of your medicines.  After the procedure, do not drive for 24 hours or as long as told by your health care provider. This information is not intended to replace advice given to you by your health care provider. Make sure you discuss any questions you have with your health care provider. Document Revised: 09/01/2019 Document Reviewed: 09/01/2019 Elsevier Patient Education  [place].

## 2021-02-15 NOTE — Progress Notes (Signed)
Electrophysiology Office Note   Date:  02/15/2021   ID:  Gordon Phillips, DOB 1946/05/02, MRN [id number]  PCP:  Vivi Barrack, MD  Cardiologist:  Harrell Gave Primary Electrophysiologist: Meridee Score, MD    Chief Complaint: AF   History of Present Illness: Gordon Phillips is a 75 y.o. male who is being seen today for the evaluation of AF at the request of Vivi Barrack, MD. Presenting today for electrophysiology evaluation.  He has a history significant for hypertension, sleep apnea on CPAP, MGUS, CVA by imaging, remote pericarditis, atrial flutter status post ablation, and atrial fibrillation.  He was diagnosed with atrial fibrillation and flutter in 2019.  He underwent an atrial flutter ablation 04/23/2019.  On 12/30/2020, he developed palpitations while walking.  He developed chest pain the next day and went to the emergency room and was noted to be in atrial fibrillation.  He had a cardioversion on 01/04/2021.  He felt well for a few days but then went back to having fatigue and dyspnea on exertion.  Today, he denies symptoms of palpitations, chest pain, orthopnea, PND, lower extremity edema, claudication, dizziness, presyncope, syncope, bleeding, or neurologic sequela. The patient is tolerating medications without difficulties.  His main symptoms are shortness of breath and fatigue.  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  He gets home and is quite fatigued and tired, unable to do his daily activities.  His wife also feels that there is an anxiety component to the way he feels.  Past Medical History:  Diagnosis Date  . ALLERGIC RHINITIS 01/25/2010  . Arthritis    "neck, back, shoulders," (02/20/2017)  . Chronic lower back pain   . High cholesterol   . Hx of cardiovascular stress test    a. Lexiscan Myoview 02/06/13:  EF 59%, no ischemia or scar  . Hx of echocardiogram    a.  Echo 02/03/13:  Mild LVH, mild focal basal septal hypertrophy, EF 50-55%, Gr 1 DD,  mild LAE  . HYPERTENSION 01/25/2010  . HYPERTHYROIDISM 01/25/2010   S/P radioactive iodine "back in the 1980s" (02/20/2017)  . Impaired glucose tolerance   . 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 Obesity   . OSA on CPAP    Uses CPAP nightly  . Pericarditis 1980's  . 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  Marland Kitchen Vitiligo    Past Surgical History:  Procedure Laterality Date  . A-FLUTTER ABLATION N/A 04/23/2019   Procedure: A-FLUTTER ABLATION;  Surgeon: Deboraha Sprang, MD;  Location: [place] [facility];  Service: Cardiovascular;  Laterality: N/A;  . BIOPSY  09/29/2019   Procedure: BIOPSY;  Surgeon: Thornton Park, MD;  Location: [place];  Service: Gastroenterology;;  . CARDIOVERSION N/A 08/14/2018   Procedure: CARDIOVERSION;  Surgeon: [name], MD;  Location: [place];  Service: Cardiovascular;  Laterality: N/A;  . CARDIOVERSION N/A 01/04/2021   Procedure: CARDIOVERSION;  Surgeon: Pixie Casino, MD;  Location: [facility] ENDOSCOPY;  Service: Cardiovascular;  Laterality: N/A;  . ESOPHAGOGASTRODUODENOSCOPY N/A 09/29/2019   Procedure: ESOPHAGOGASTRODUODENOSCOPY (EGD);  Surgeon: Thornton Park, MD;  Location: [place];  Service: Gastroenterology;  Laterality: N/A;  . INGUINAL HERNIA REPAIR Bilateral   . LUMBAR LAMINECTOMY    . TEE WITHOUT CARDIOVERSION N/A 08/14/2018   Procedure: TRANSESOPHAGEAL ECHOCARDIOGRAM (TEE);  Surgeon: [name], MD;  Location: [facility] ENDOSCOPY;  Service: Cardiovascular;  Laterality: N/A;  .  UMBILICAL HERNIA REPAIR       Current Outpatient Medications  Medication Sig Dispense Refill  . acetaminophen (TYLENOL) 500 MG tablet Take 1,000 mg by mouth every 6 (six) hours as needed for moderate pain.    . Aloe-Sodium Chloride (AYR SALINE NASAL GEL NA) Place 1 application into the nose at bedtime as needed (congestion).    Marland Kitchen apixaban (ELIQUIS) 5 MG TABS tablet Take 1 tablet (5 mg total) by mouth 2  (two) times daily. 180 tablet 2  . diltiazem (CARDIZEM CD) 120 MG 24 hr capsule TAKE 1 CAPSULE BY MOUTH EVERY DAY 90 capsule 3  . doxazosin (CARDURA) 2 MG tablet TAKE 1 TABLET (2 MG TOTAL) BY MOUTH AT BEDTIME. 90 tablet 2  . finasteride (PROSCAR) 5 MG tablet Take 5 mg by mouth daily.    . hydrocortisone cream 1 % Apply 1 application topically 2 (two) times daily as needed for itching.    . losartan (COZAAR) 50 MG tablet TAKE 1 TABLET BY MOUTH EVERY DAY 90 tablet 1  . metoprolol succinate (TOPROL-XL) 100 MG 24 hr tablet TAKE 1 TABLET (100 MG TOTAL) BY MOUTH AT BEDTIME. TAKE WITH OR IMMEDIATELY FOLLOWING A MEAL. 90 tablet 3  . Multiple Vitamin (MULTIVITAMIN WITH MINERALS) TABS tablet Take 1 tablet by mouth 3 (three) times a week.    Marland Kitchen OVER THE COUNTER MEDICATION Take 1 tablet by mouth daily. OTC - Super Beta Prostate    . pravastatin (PRAVACHOL) 40 MG tablet TAKE 1 TABLET BY MOUTH EVERYDAY AT BEDTIME 90 tablet 3  . PRESCRIPTION MEDICATION Inhale into the lungs at bedtime. CPAP    . sildenafil (VIAGRA) 100 MG tablet Take 100 mg by mouth daily as needed for erectile dysfunction.     . vitamin C (ASCORBIC ACID) 500 MG tablet Take 500 mg by mouth 3 (three) times a week.     No current facility-administered medications for this visit.    Allergies:   Flomax [tamsulosin hcl], Relpax [eletriptan], and Other   Social History:  The patient  reports that he quit smoking about 54 years ago. His smoking use included cigarettes. He has a 4.00 pack-year smoking history. He has never used smokeless tobacco. He reports current alcohol use of about 1.0 - 2.0 standard drink of alcohol per week. He reports that he does not use drugs.   Family History:  The patient's family history includes Stroke in his father; Sudden death in his brother; Throat cancer in his brother; Thyroid disease in his sister.    ROS:  Please see the history of present illness.   Otherwise, review of systems is positive for none.   All  other systems are reviewed and negative.    PHYSICAL EXAM: VS:  BP (!) 158/68   Pulse 71   Ht 6\' 2"  (1.88 m)   Wt 275 lb (124.7 kg)   BMI 35.31 kg/m  , BMI Body mass index is 35.31 kg/m. GEN: Well nourished, well developed, in no acute distress  HEENT: normal  Neck: no JVD, carotid bruits, or masses Cardiac: Irregular; no murmurs, rubs, or gallops,no edema  Respiratory:  clear to auscultation bilaterally, normal work of breathing GI: soft, nontender, nondistended, + BS MS: no deformity or atrophy  Skin: warm and dry Neuro:  Strength and sensation are intact Psych: euthymic mood, full affect  EKG:  EKG is ordered today. Personal review of the ekg ordered shows atrial flutter, rate 71  Recent Labs: 08/16/2020: ALT 17 12/21/2020: BUN 13; Creatinine,  Ser 1.00; Hemoglobin 13.2; Platelets 143; Potassium 3.4; Sodium 136    Lipid Panel     Component Value Date/Time   CHOL 145 08/26/2019 1424   TRIG 68.0 08/26/2019 1424   HDL 40.40 08/26/2019 1424   CHOLHDL 4 08/26/2019 1424   VLDL 13.6 08/26/2019 1424   LDLCALC 91 08/26/2019 1424   LDLDIRECT 139.2 11/21/2012 1122     Wt Readings from Last 3 Encounters:  02/15/21 275 lb (124.7 kg)  01/17/21 275 lb 3.2 oz (124.8 kg)  01/11/21 276 lb 12.8 oz (125.6 kg)      Other studies Reviewed: Additional studies/ records that were reviewed today include: TTE 02/07/21  Review of the above records today demonstrates:  1. Left ventricular ejection fraction, by estimation, is 55 to 60%. The  left ventricle has normal function. The left ventricle has no regional  wall motion abnormalities. There is mild concentric left ventricular  hypertrophy of the basal-septal segment.  Left ventricular diastolic parameters are indeterminate.  2. Right ventricular systolic function is normal. The right ventricular  size is normal. There is mildly elevated pulmonary artery systolic  pressure.  3. The mitral valve is grossly normal. Trivial mitral  valve  regurgitation. No evidence of mitral stenosis.  4. The aortic valve is tricuspid. There is mild thickening of the aortic  valve. Aortic valve regurgitation is not visualized. No aortic stenosis is  present.  5. The inferior vena cava is normal in size with greater than 50%  respiratory variability, suggesting right atrial pressure of 3 mmHg.    ASSESSMENT AND PLAN:  1.  Persistent atrial fibrillation/atrial flutter: Currently on Eliquis.  CHA2DS2-VASc of 5.  He has had multiple episodes of tachypalpitations.  He is status post atrial flutter ablation in the past.  We discussed further therapy including dofetilide versus ablation.  Risks include bleeding, tamponade, heart block, stroke, damage to chest organs.  He Gordon Phillips discuss this procedure further with his wife at home.  2.  Obesity: Diet and exercise encouraged  3.  Obstructive sleep apnea: CPAP compliance encouraged  4.  Hypertension: Elevated today.  Usually better controlled.  No changes.  Case discussed with primary cardiology  Current medicines are reviewed at length with the patient today.   The patient does not have concerns regarding his medicines.  The following changes were made today:  none  Labs/ tests ordered today include:  Orders Placed This Encounter  Procedures  . EKG 12-Lead     Disposition:   FU with Sylvi Rybolt 3 months  Signed, Marylee Belzer Meredith Leeds, MD  02/15/2021 11:54 AM     [facility] [address] [place] [address] [phone] (office) [phone] (fax)

## 2021-02-16 ENCOUNTER — Telehealth: Payer: Self-pay | Admitting: Cardiology

## 2021-02-16 DIAGNOSIS — Z01812 Encounter for preprocedural laboratory examination: Secondary | ICD-10-CM

## 2021-02-16 DIAGNOSIS — I4819 Other persistent atrial fibrillation: Secondary | ICD-10-CM

## 2021-02-16 LAB — MULTIPLE MYELOMA PANEL, SERUM
Albumin SerPl Elph-Mcnc: 3.7 g/dL (ref 2.9–4.4)
Albumin/Glob SerPl: 1.2 (ref 0.7–1.7)
Alpha 1: 0.2 g/dL (ref 0.0–0.4)
Alpha2 Glob SerPl Elph-Mcnc: 0.9 g/dL (ref 0.4–1.0)
B-Globulin SerPl Elph-Mcnc: 0.9 g/dL (ref 0.7–1.3)
Gamma Glob SerPl Elph-Mcnc: 1.2 g/dL (ref 0.4–1.8)
Globulin, Total: 3.2 g/dL (ref 2.2–3.9)
IgA: 122 mg/dL (ref 61–437)
IgG (Immunoglobin G), Serum: 1145 mg/dL (ref 603–1613)
IgM (Immunoglobulin M), Srm: 60 mg/dL (ref 15–143)
M Protein SerPl Elph-Mcnc: 0.4 g/dL — ABNORMAL HIGH
Total Protein ELP: 6.9 g/dL (ref 6.0–8.5)

## 2021-02-16 LAB — KAPPA/LAMBDA LIGHT CHAINS
Kappa free light chain: 14.4 mg/L (ref 3.3–19.4)
Kappa, lambda light chain ratio: 1.38 (ref 0.26–1.65)
Lambda free light chains: 10.4 mg/L (ref 5.7–26.3)

## 2021-02-16 NOTE — Telephone Encounter (Signed)
PT is calling to speak with DR. Camnitz nurse to get him scheduled for his procedure at the hospital.Please advise

## 2021-02-16 NOTE — Telephone Encounter (Signed)
Pt voiced that he would like to proceed with ablation on 5/25. Aware I will make arrangements and be in touch to review instructions.

## 2021-02-28 ENCOUNTER — Other Ambulatory Visit: Payer: Self-pay | Admitting: Family Medicine

## 2021-03-14 NOTE — Telephone Encounter (Signed)
Pt calling back in regards to scheduling his Covid and Blood Test and CAT Scan prior to his procedure on 03/30/21. Pt states it's ok for him to receive a call back tomorrow. Please advise

## 2021-03-15 NOTE — Addendum Note (Signed)
Addended by: Stanton Kidney on: 03/15/2021 05:35 PM   Modules accepted: Orders

## 2021-03-15 NOTE — Telephone Encounter (Signed)
Discussed procedure instructions w/ pt and aware I would send via mychart. Pt aware hospital will call to arrange pre ablation CT near end of next week. Pt aware office will call to arrange post procedure follow up. Advised to let me know if we needed to further review information sent/instructions. Patient verbalized understanding and agreeable to plan.

## 2021-03-18 ENCOUNTER — Telehealth: Payer: Self-pay | Admitting: Cardiology

## 2021-03-18 MED ORDER — FUROSEMIDE 20 MG PO TABS: 20.0000 mg | ORAL_TABLET | Freq: Every day | ORAL | 0 refills | Status: DC

## 2021-03-18 NOTE — Telephone Encounter (Signed)
Patient calling to speak with Dr. Curt Bears nurse, did not want me to take a message but would like a call back as soon as she is available.

## 2021-03-18 NOTE — Telephone Encounter (Signed)
Pt reporting that he is experiencing some LEE in legs & feet. Does not think he has gained any weight. He was calling to make sure this would not interfere with upcoming procedure. Aware I will forward to Dr. Curt Bears and we may do a few days of Lasix to pull of the fluid. Pt also aware office will call beginning of next week to get him scheduled for TEE pre ablation. Patient verbalized understanding and agreeable to plan.

## 2021-03-18 NOTE — Telephone Encounter (Signed)
Pt advised to take Lasix 20 mg for the next 2-3 days. Advised if edema improves after 2 days then he does NOT need to take the 3rd day dosing. Advised to increase K+ intake while taking the lasix. Patient verbalized understanding and agreeable to plan.

## 2021-03-21 ENCOUNTER — Other Ambulatory Visit: Payer: Self-pay

## 2021-03-21 ENCOUNTER — Telehealth: Payer: Self-pay

## 2021-03-21 ENCOUNTER — Other Ambulatory Visit: Payer: Medicare HMO | Admitting: *Deleted

## 2021-03-21 DIAGNOSIS — I4819 Other persistent atrial fibrillation: Secondary | ICD-10-CM

## 2021-03-21 DIAGNOSIS — Z01812 Encounter for preprocedural laboratory examination: Secondary | ICD-10-CM

## 2021-03-21 NOTE — Telephone Encounter (Signed)
Pt verbalized understanding that he will not have a Cardiac Ct and will have a TEE the same day as his Ablation 03/30/21 with 7 am arrival.   Labs today 03/21/21.  Sent to precert Awaiting for official word if he needs COVID test from supervisor and advised pt that I will call him back this afternoon.

## 2021-03-22 LAB — BASIC METABOLIC PANEL
BUN/Creatinine Ratio: 14 (ref 10–24)
BUN: 15 mg/dL (ref 8–27)
CO2: 22 mmol/L (ref 20–29)
Calcium: 9 mg/dL (ref 8.6–10.2)
Chloride: 102 mmol/L (ref 96–106)
Creatinine, Ser: 1.04 mg/dL (ref 0.76–1.27)
Glucose: 93 mg/dL (ref 65–99)
Potassium: 3.8 mmol/L (ref 3.5–5.2)
Sodium: 138 mmol/L (ref 134–144)
eGFR: 76 mL/min/{1.73_m2} (ref >59)

## 2021-03-22 LAB — CBC

## 2021-03-23 ENCOUNTER — Other Ambulatory Visit: Payer: Self-pay | Admitting: *Deleted

## 2021-03-23 DIAGNOSIS — Z01812 Encounter for preprocedural laboratory examination: Secondary | ICD-10-CM

## 2021-03-23 DIAGNOSIS — I4819 Other persistent atrial fibrillation: Secondary | ICD-10-CM

## 2021-03-28 ENCOUNTER — Other Ambulatory Visit: Payer: Medicare HMO | Admitting: *Deleted

## 2021-03-28 ENCOUNTER — Other Ambulatory Visit (HOSPITAL_COMMUNITY): Payer: Medicare HMO

## 2021-03-28 ENCOUNTER — Other Ambulatory Visit: Payer: Self-pay

## 2021-03-28 DIAGNOSIS — Z01812 Encounter for preprocedural laboratory examination: Secondary | ICD-10-CM

## 2021-03-28 DIAGNOSIS — I4819 Other persistent atrial fibrillation: Secondary | ICD-10-CM

## 2021-03-28 LAB — CBC
Hematocrit: 39.6 % (ref 37.5–51.0)
Hemoglobin: 13.2 g/dL (ref 13.0–17.7)
MCH: 30.1 pg (ref 26.6–33.0)
MCHC: 33.3 g/dL (ref 31.5–35.7)
MCV: 90 fL (ref 79–97)
Platelets: 143 10*3/uL — ABNORMAL LOW (ref 150–450)
RBC: 4.39 x10E6/uL (ref 4.14–5.80)
RDW: 13.9 % (ref 11.6–15.4)
WBC: 5.4 10*3/uL (ref 3.4–10.8)

## 2021-03-29 NOTE — Pre-Procedure Instructions (Signed)
Instructed patient on the following items: °Arrival time 0930 °Nothing to eat or drink after midnight °No meds AM of procedure °Responsible person to drive you home and stay with you for 24 hrs ° °Have you missed any doses of anti-coagulant Eliquis- hasn't missed any doses °   °

## 2021-03-30 ENCOUNTER — Ambulatory Visit (HOSPITAL_COMMUNITY)
Admission: RE | Admit: 2021-03-30 | Discharge: 2021-03-30 | Disposition: A | Discharge status: Home / Self Care | Payer: Medicare HMO | Attending: Cardiovascular Disease | Admitting: Cardiovascular Disease

## 2021-03-30 ENCOUNTER — Encounter (HOSPITAL_COMMUNITY)
Admission: RE | Disposition: A | Discharge status: Home / Self Care | Payer: Self-pay | Source: Home / Self Care | Attending: Cardiovascular Disease

## 2021-03-30 ENCOUNTER — Encounter (HOSPITAL_COMMUNITY): Payer: Self-pay | Admitting: Cardiovascular Disease

## 2021-03-30 ENCOUNTER — Ambulatory Visit (HOSPITAL_COMMUNITY): Payer: Medicare HMO | Admitting: Certified Registered"

## 2021-03-30 ENCOUNTER — Ambulatory Visit (HOSPITAL_COMMUNITY): Payer: Medicare HMO | Admitting: Certified Registered Nurse Anesthetist

## 2021-03-30 ENCOUNTER — Other Ambulatory Visit: Payer: Self-pay

## 2021-03-30 ENCOUNTER — Ambulatory Visit (HOSPITAL_BASED_OUTPATIENT_CLINIC_OR_DEPARTMENT_OTHER): Payer: Medicare HMO

## 2021-03-30 DIAGNOSIS — I358 Other nonrheumatic aortic valve disorders: Secondary | ICD-10-CM | POA: Diagnosis not present

## 2021-03-30 DIAGNOSIS — Z823 Family history of stroke: Secondary | ICD-10-CM | POA: Diagnosis not present

## 2021-03-30 DIAGNOSIS — G4733 Obstructive sleep apnea (adult) (pediatric): Secondary | ICD-10-CM | POA: Diagnosis not present

## 2021-03-30 DIAGNOSIS — Z6835 Body mass index (BMI) 35.0-35.9, adult: Secondary | ICD-10-CM | POA: Insufficient documentation

## 2021-03-30 DIAGNOSIS — I4891 Unspecified atrial fibrillation: Secondary | ICD-10-CM | POA: Diagnosis not present

## 2021-03-30 DIAGNOSIS — Z8719 Personal history of other diseases of the digestive system: Secondary | ICD-10-CM | POA: Diagnosis not present

## 2021-03-30 DIAGNOSIS — E669 Obesity, unspecified: Secondary | ICD-10-CM | POA: Diagnosis not present

## 2021-03-30 DIAGNOSIS — I10 Essential (primary) hypertension: Secondary | ICD-10-CM | POA: Insufficient documentation

## 2021-03-30 DIAGNOSIS — E0590 Thyrotoxicosis, unspecified without thyrotoxic crisis or storm: Secondary | ICD-10-CM | POA: Insufficient documentation

## 2021-03-30 DIAGNOSIS — Z8673 Personal history of transient ischemic attack (TIA), and cerebral infarction without residual deficits: Secondary | ICD-10-CM | POA: Insufficient documentation

## 2021-03-30 DIAGNOSIS — I700 Atherosclerosis of aorta: Secondary | ICD-10-CM | POA: Insufficient documentation

## 2021-03-30 DIAGNOSIS — Z87891 Personal history of nicotine dependence: Secondary | ICD-10-CM | POA: Diagnosis not present

## 2021-03-30 DIAGNOSIS — Z8349 Family history of other endocrine, nutritional and metabolic diseases: Secondary | ICD-10-CM | POA: Insufficient documentation

## 2021-03-30 DIAGNOSIS — E7800 Pure hypercholesterolemia, unspecified: Secondary | ICD-10-CM | POA: Insufficient documentation

## 2021-03-30 DIAGNOSIS — I4892 Unspecified atrial flutter: Secondary | ICD-10-CM | POA: Diagnosis not present

## 2021-03-30 DIAGNOSIS — Z888 Allergy status to other drugs, medicaments and biological substances status: Secondary | ICD-10-CM | POA: Insufficient documentation

## 2021-03-30 DIAGNOSIS — I4819 Other persistent atrial fibrillation: Secondary | ICD-10-CM | POA: Insufficient documentation

## 2021-03-30 HISTORY — PX: TEE WITHOUT CARDIOVERSION: SHX5443

## 2021-03-30 HISTORY — PX: ATRIAL FIBRILLATION ABLATION: EP1191

## 2021-03-30 LAB — ECHO TEE
AV Mean grad: 7 mmHg
AV Peak grad: 14.1 mmHg
Ao pk vel: 1.88 m/s

## 2021-03-30 LAB — POCT ACTIVATED CLOTTING TIME
Activated Clotting Time: 315 seconds
Activated Clotting Time: 309 seconds
Activated Clotting Time: 321 seconds
Activated Clotting Time: 333 seconds

## 2021-03-30 SURGERY — ECHOCARDIOGRAM, TRANSESOPHAGEAL
Anesthesia: Monitor Anesthesia Care

## 2021-03-30 SURGERY — ATRIAL FIBRILLATION ABLATION
Anesthesia: General

## 2021-03-30 MED ORDER — PROPOFOL 10 MG/ML IV BOLUS
INTRAVENOUS | Status: DC | PRN
  Administered 2021-03-30: 20 mg via INTRAVENOUS
  Administered 2021-03-30: 10 mg via INTRAVENOUS

## 2021-03-30 MED ORDER — HEPARIN (PORCINE) IN NACL 1000-0.9 UT/500ML-% IV SOLN
INTRAVENOUS | Status: AC
  Filled 2021-03-30: qty 2500

## 2021-03-30 MED ORDER — PROTAMINE SULFATE 10 MG/ML IV SOLN
INTRAVENOUS | Status: DC | PRN
  Administered 2021-03-30: 40 mg via INTRAVENOUS

## 2021-03-30 MED ORDER — PROPOFOL 10 MG/ML IV BOLUS
INTRAVENOUS | Status: DC | PRN
  Administered 2021-03-30: 180 mg via INTRAVENOUS

## 2021-03-30 MED ORDER — PHENYLEPHRINE 40 MCG/ML (10ML) SYRINGE FOR IV PUSH (FOR BLOOD PRESSURE SUPPORT)
PREFILLED_SYRINGE | INTRAVENOUS | Status: DC | PRN
  Administered 2021-03-30: 80 ug via INTRAVENOUS

## 2021-03-30 MED ORDER — HEPARIN SODIUM (PORCINE) 1000 UNIT/ML IJ SOLN
INTRAMUSCULAR | Status: AC
  Filled 2021-03-30: qty 1

## 2021-03-30 MED ORDER — BUTAMBEN-TETRACAINE-BENZOCAINE 2-2-14 % EX AERO
INHALATION_SPRAY | CUTANEOUS | Status: DC | PRN
  Administered 2021-03-30: 2 via TOPICAL

## 2021-03-30 MED ORDER — PHENYLEPHRINE HCL-NACL 10-0.9 MG/250ML-% IV SOLN
INTRAVENOUS | Status: DC | PRN
  Administered 2021-03-30: 25 ug/min via INTRAVENOUS

## 2021-03-30 MED ORDER — DEXAMETHASONE SODIUM PHOSPHATE 10 MG/ML IJ SOLN
INTRAMUSCULAR | Status: DC | PRN
  Administered 2021-03-30: 4 mg via INTRAVENOUS

## 2021-03-30 MED ORDER — SUCCINYLCHOLINE CHLORIDE 20 MG/ML IJ SOLN
INTRAMUSCULAR | Status: DC | PRN
  Administered 2021-03-30: 120 mg via INTRAVENOUS

## 2021-03-30 MED ORDER — FENTANYL CITRATE (PF) 100 MCG/2ML IJ SOLN
INTRAMUSCULAR | Status: DC | PRN
  Administered 2021-03-30: 100 ug via INTRAVENOUS

## 2021-03-30 MED ORDER — ROCURONIUM BROMIDE 100 MG/10ML IV SOLN
INTRAVENOUS | Status: DC | PRN
  Administered 2021-03-30: 50 mg via INTRAVENOUS

## 2021-03-30 MED ORDER — SODIUM CHLORIDE 0.9 % IV SOLN
250.0000 mL | INTRAVENOUS | Status: DC | PRN
Start: 2021-03-30 — End: 2021-03-30

## 2021-03-30 MED ORDER — SODIUM CHLORIDE 0.9% FLUSH: 3.0000 mL | INTRAVENOUS | Status: DC | PRN

## 2021-03-30 MED ORDER — HEPARIN (PORCINE) IN NACL 1000-0.9 UT/500ML-% IV SOLN
INTRAVENOUS | Status: DC | PRN
  Administered 2021-03-30 (×5): 500 mL

## 2021-03-30 MED ORDER — SODIUM CHLORIDE 0.9 % IV SOLN: INTRAVENOUS | Status: DC

## 2021-03-30 MED ORDER — ONDANSETRON HCL 4 MG/2ML IJ SOLN: 4.0000 mg | Freq: Four times a day (QID) | INTRAMUSCULAR | Status: DC | PRN

## 2021-03-30 MED ORDER — ACETAMINOPHEN 325 MG PO TABS
650.0000 mg | ORAL_TABLET | ORAL | Status: DC | PRN
  Filled 2021-03-30: qty 2

## 2021-03-30 MED ORDER — FENTANYL CITRATE (PF) 100 MCG/2ML IJ SOLN
INTRAMUSCULAR | Status: AC
  Filled 2021-03-30: qty 2

## 2021-03-30 MED ORDER — ONDANSETRON HCL 4 MG/2ML IJ SOLN
INTRAMUSCULAR | Status: DC | PRN
  Administered 2021-03-30: 4 mg via INTRAVENOUS

## 2021-03-30 MED ORDER — LIDOCAINE 2% (20 MG/ML) 5 ML SYRINGE
INTRAMUSCULAR | Status: DC | PRN
  Administered 2021-03-30: 80 mg via INTRAVENOUS

## 2021-03-30 MED ORDER — SODIUM CHLORIDE 0.9% FLUSH: 3.0000 mL | Freq: Two times a day (BID) | INTRAVENOUS | Status: DC

## 2021-03-30 MED ORDER — HEPARIN SODIUM (PORCINE) 1000 UNIT/ML IJ SOLN
INTRAMUSCULAR | Status: DC | PRN
  Administered 2021-03-30: 3000 [IU] via INTRAVENOUS
  Administered 2021-03-30: 2000 [IU] via INTRAVENOUS
  Administered 2021-03-30: 1000 [IU] via INTRAVENOUS
  Administered 2021-03-30: 15000 [IU] via INTRAVENOUS
  Administered 2021-03-30: 2000 [IU] via INTRAVENOUS

## 2021-03-30 MED ORDER — HEPARIN SODIUM (PORCINE) 1000 UNIT/ML IJ SOLN
INTRAMUSCULAR | Status: DC | PRN
  Administered 2021-03-30: 1000 [IU] via INTRAVENOUS

## 2021-03-30 MED ORDER — PROPOFOL 500 MG/50ML IV EMUL
INTRAVENOUS | Status: DC | PRN
  Administered 2021-03-30: 125 ug/kg/min via INTRAVENOUS

## 2021-03-30 MED ORDER — SUGAMMADEX SODIUM 200 MG/2ML IV SOLN
INTRAVENOUS | Status: DC | PRN
  Administered 2021-03-30: 200 mg via INTRAVENOUS
  Administered 2021-03-30 (×2): 100 mg via INTRAVENOUS

## 2021-03-30 MED ORDER — FENTANYL CITRATE (PF) 100 MCG/2ML IJ SOLN
50.0000 ug | Freq: Once | INTRAMUSCULAR | Status: AC
Start: 2021-03-30 — End: 2021-03-30
  Administered 2021-03-30: 50 ug via INTRAVENOUS

## 2021-03-30 MED ORDER — LIDOCAINE 2% (20 MG/ML) 5 ML SYRINGE
INTRAMUSCULAR | Status: DC | PRN
  Administered 2021-03-30: 40 mg via INTRAVENOUS

## 2021-03-30 MED ORDER — COLCHICINE 0.6 MG PO TABS: 0.6000 mg | ORAL_TABLET | Freq: Every day | ORAL | 0 refills | Status: DC

## 2021-03-30 MED ORDER — SODIUM CHLORIDE 0.9 % IV SOLN
INTRAVENOUS | Status: AC | PRN
  Administered 2021-03-30: 500 mL via INTRAVENOUS

## 2021-03-30 SURGICAL SUPPLY — 21 items
BLANKET WARM UNDERBOD FULL ACC (MISCELLANEOUS) ×2 IMPLANT
CATH 8FR REPROCESSED SOUNDSTAR (CATHETERS) ×2 IMPLANT
CATH MAPPNG PENTARAY F 2-6-2MM (CATHETERS) ×1 IMPLANT
CATH S CIRCA THERM PROBE 10F (CATHETERS) ×2 IMPLANT
CATH SMTCH THERMOCOOL SF DF (CATHETERS) ×2 IMPLANT
CATH WEBSTER BI DIR CS D-F CRV (CATHETERS) ×2 IMPLANT
CLOSURE PERCLOSE PROSTYLE (VASCULAR PRODUCTS) ×8 IMPLANT
COVER DOME SNAP 22 D (MISCELLANEOUS) ×2 IMPLANT
COVER SWIFTLINK CONNECTOR (BAG) ×2 IMPLANT
KIT VERSACROSS STEERABLE D1 (CATHETERS) ×2 IMPLANT
PACK EP LATEX FREE (CUSTOM PROCEDURE TRAY) ×2
PACK EP LF (CUSTOM PROCEDURE TRAY) ×1 IMPLANT
PAD PRO RADIOLUCENT 2001M-C (PAD) ×2 IMPLANT
PATCH CARTO3 (PAD) ×2 IMPLANT
PENTARAY F 2-6-2MM (CATHETERS) ×2
SHEATH CARTO VIZIGO SM CVD (SHEATH) ×2 IMPLANT
SHEATH PINNACLE 7F 10CM (SHEATH) ×2 IMPLANT
SHEATH PINNACLE 8F 10CM (SHEATH) ×4 IMPLANT
SHEATH PINNACLE 9F 10CM (SHEATH) ×2 IMPLANT
SHEATH PROBE COVER 6X72 (BAG) ×2 IMPLANT
TUBING SMART ABLATE COOLFLOW (TUBING) ×2 IMPLANT

## 2021-03-30 NOTE — Anesthesia Preprocedure Evaluation (Signed)
Anesthesia Evaluation  Patient identified by MRN, date of birth, ID band Patient awake    Reviewed: Allergy & Precautions, NPO status , Patient's Chart, lab work & pertinent test results  Airway Mallampati: II  TM Distance: >3 FB Neck ROM: Full    Dental  (+) Dental Advisory Given, Missing   Pulmonary sleep apnea and Continuous Positive Airway Pressure Ventilation , former smoker,    Pulmonary exam normal breath sounds clear to auscultation       Cardiovascular hypertension, + dysrhythmias Atrial Fibrillation  Rhythm:Irregular Rate:Abnormal     Neuro/Psych CVA, No Residual Symptoms negative psych ROS   GI/Hepatic negative GI ROS, Neg liver ROS,   Endo/Other  negative endocrine ROS  Renal/GU negative Renal ROS     Musculoskeletal  (+) Arthritis ,   Abdominal   Peds  Hematology  (+) Blood dyscrasia (Thrombocytopenia), , MGUS    Anesthesia Other Findings Day of surgery medications reviewed with the patient.  Reproductive/Obstetrics                             Anesthesia Physical Anesthesia Plan  ASA: III  Anesthesia Plan: MAC   Post-op Pain Management:    Induction: Intravenous  PONV Risk Score and Plan: 1 and Propofol infusion and Treatment may vary due to age or medical condition  Airway Management Planned: Nasal Cannula and Natural Airway  Additional Equipment:   Intra-op Plan:   Post-operative Plan:   Informed Consent: I have reviewed the patients History and Physical, chart, labs and discussed the procedure including the risks, benefits and alternatives for the proposed anesthesia with the patient or authorized representative who has indicated his/her understanding and acceptance.     Dental advisory given  Plan Discussed with: CRNA and Anesthesiologist  Anesthesia Plan Comments:         Anesthesia Quick Evaluation

## 2021-03-30 NOTE — Progress Notes (Signed)
Per patient back pain level is a 3 out of 10.  Dr Curt Bears in to see patient.

## 2021-03-30 NOTE — Anesthesia Preprocedure Evaluation (Addendum)
Anesthesia Evaluation  Patient identified by MRN, date of birth, ID band Patient awake    Reviewed: Allergy & Precautions, NPO status , Patient's Chart, lab work & pertinent test results  Airway Mallampati: III  TM Distance: >3 FB Neck ROM: Full    Dental  (+) Teeth Intact   Pulmonary sleep apnea and Continuous Positive Airway Pressure Ventilation , former smoker,    Pulmonary exam normal        Cardiovascular hypertension, Pt. on medications and Pt. on home beta blockers + dysrhythmias Atrial Fibrillation  Rhythm:Irregular Rate:Normal     Neuro/Psych CVA, No Residual Symptoms negative psych ROS   GI/Hepatic negative GI ROS, Neg liver ROS,   Endo/Other  negative endocrine ROS  Renal/GU negative Renal ROS  negative genitourinary   Musculoskeletal  (+) Arthritis , Osteoarthritis,    Abdominal (+)  Abdomen: soft. Bowel sounds: normal.  Peds  Hematology negative hematology ROS (+)   Anesthesia Other Findings   Reproductive/Obstetrics                            Anesthesia Physical Anesthesia Plan  ASA: III  Anesthesia Plan: General   Post-op Pain Management:    Induction: Intravenous  PONV Risk Score and Plan: 2 and Ondansetron, Dexamethasone and Treatment may vary due to age or medical condition  Airway Management Planned: Mask and Oral ETT  Additional Equipment: None  Intra-op Plan:   Post-operative Plan: Extubation in OR  Informed Consent: I have reviewed the patients History and Physical, chart, labs and discussed the procedure including the risks, benefits and alternatives for the proposed anesthesia with the patient or authorized representative who has indicated his/her understanding and acceptance.     Dental advisory given  Plan Discussed with: CRNA  Anesthesia Plan Comments: (Lab Results      Component                Value               Date                       WBC                      5.4                 03/28/2021                HGB                      13.2                03/28/2021                HCT                      39.6                03/28/2021                MCV                      90                  03/28/2021                PLT  143 (L)             03/28/2021           Lab Results      Component                Value               Date                      NA                       138                 03/21/2021                K                        3.8                 03/21/2021                CO2                      22                  03/21/2021                GLUCOSE                  93                  03/21/2021                BUN                      15                  03/21/2021                CREATININE               1.04                03/21/2021                CALCIUM                  9.0                 03/21/2021                GFRNONAA                 >60                 02/15/2021                GFRAA                    >60                 02/25/2020            ECHO 03/30/21: 1. Left ventricular ejection fraction, by estimation, is 40 to 45%. The  left ventricle has mildly decreased function. The left ventricle  demonstrates global hypokinesis.  2. Right ventricular systolic function is moderately reduced. The right  ventricular  size is normal.  3. Left atrial size was severely dilated. No left atrial/left atrial  appendage thrombus was detected. The LAA emptying velocity was 78 cm/s.  4. The mitral valve is normal in structure. Trivial mitral valve  regurgitation. No evidence of mitral stenosis.  5. Calcification and partial fusion of the left and non-coronary cusps.  The aortic valve is calcified. There is mild calcification of the aortic  valve. There is mild thickening of the aortic valve. Aortic valve  regurgitation is not visualized. No aortic  stenosis is present.  6. There is  mild (Grade II) atheroma plaque involving the descending  aorta. )        Anesthesia Quick Evaluation

## 2021-03-30 NOTE — Discharge Instructions (Signed)
Femoral Site Care  This sheet gives you information about how to care for yourself after your procedure. Your health care provider may also give you more specific instructions. If you have problems or questions, contact your health care provider. What can I expect after the procedure? After the procedure, it is common to have:  Bruising that usually fades within 1-2 weeks.  Tenderness at the site. Follow these instructions at home: Wound care  Follow instructions from your health care provider about how to take care of your insertion site. Make sure you: ? Wash your hands with soap and water before you change your bandage (dressing). If soap and water are not available, use hand sanitizer. ? Change your dressing as told by your health care provider. ? Leave stitches (sutures), skin glue, or adhesive strips in place. These skin closures may need to stay in place for 2 weeks or longer. If adhesive strip edges start to loosen and curl up, you may trim the loose edges. Do not remove adhesive strips completely unless your health care provider tells you to do that.  Do not take baths, swim, or use a hot tub until your health care provider approves.  You may shower 24-48 hours after the procedure or as told by your health care provider. ? Gently wash the site with plain soap and water. ? Pat the area dry with a clean towel. ? Do not rub the site. This may cause bleeding.  Do not apply powder or lotion to the site. Keep the site clean and dry.  Check your femoral site every day for signs of infection. Check for: ? Redness, swelling, or pain. ? Fluid or blood. ? Warmth. ? Pus or a bad smell. Activity  For the first 2-3 days after your procedure, or as long as directed: ? Avoid climbing stairs as much as possible. ? Do not squat.  Do not lift anything that is heavier than 10 lb (4.5 kg), or the limit that you are told, until your health care provider says that it is safe.  Rest as  directed. ? Avoid sitting for a long time without moving. Get up to take short walks every 1-2 hours.  Do not drive for 24 hours if you were given a medicine to help you relax (sedative). General instructions  Take over-the-counter and prescription medicines only as told by your health care provider.  Keep all follow-up visits as told by your health care provider. This is important. Contact a health care provider if you have:  A fever or chills.  You have redness, swelling, or pain around your insertion site. Get help right away if:  The catheter insertion area swells very fast.  You pass out.  You suddenly start to sweat or your skin gets clammy.  The catheter insertion area is bleeding, and the bleeding does not stop when you hold steady pressure on the area.  The area near or just beyond the catheter insertion site becomes pale, cool, tingly, or numb. These symptoms may represent a serious problem that is an emergency. Do not wait to see if the symptoms will go away. Get medical help right away. Call your local emergency services (911 in the [place] Do not drive yourself to the hospital. Summary  After the procedure, it is common to have bruising that usually fades within 1-2 weeks.  Check your femoral site every day for signs of infection.  Do not lift anything that is heavier than 10 lb (4.5 kg), or   the limit that you are told, until your health care provider says that it is safe. This information is not intended to replace advice given to you by your health care provider. Make sure you discuss any questions you have with your health care provider. Document Revised: 06/25/2020 Document Reviewed: 06/25/2020 Elsevier Patient Education  [place] procedure care instructions No driving for 4 days. No lifting over 5 lbs for 1 week. No vigorous or sexual activity for 1 week. You may return to work/your usual activities on 04/07/21. Keep procedure site clean & dry. If  you notice increased pain, swelling, bleeding or pus, call/return!  You may shower after 24 hours, but no soaking in baths/hot tubs/pools for 1 week.    You have an appointment set up with the [facility].  Multiple studies have shown that being followed by a dedicated atrial fibrillation clinic in addition to the standard care you receive from your other physicians improves health. We believe that enrollment in the atrial fibrillation clinic will allow [place] to better care for you.   The phone number to the [facility] is [phone]. The clinic is staffed Monday through Friday from 8:30am to 5pm.  Parking Directions: The clinic is located in the Heart and Vascular Building connected to [facility]. 1)From [address] turn on to [address] and go to the 3rd entrance  (Heart and Vascular entrance) on the right. 2)Look to the right for Heart &Vascular Parking Garage. 3)A code for the entrance is required, for June is 4233.   4)Take the elevators to the 1st floor. Registration is in the room with the glass walls at the end of the hallway.  If you have any trouble parking or locating the clinic, please don't hesitate to call [phone].

## 2021-03-30 NOTE — Transfer of Care (Signed)
Immediate Anesthesia Transfer of Care Note  Patient: Gordon Phillips  Procedure(s) Performed: TRANSESOPHAGEAL ECHOCARDIOGRAM (TEE) (N/A )  Patient Location: Endoscopy Unit  Anesthesia Type:MAC  Level of Consciousness: awake, alert , oriented, patient cooperative and responds to stimulation  Airway & Oxygen Therapy: Patient Spontanous Breathing and Patient connected to nasal cannula oxygen  Post-op Assessment: Report given to RN and Post -op Vital signs reviewed and stable  Post vital signs: Reviewed and stable  Last Vitals:  Vitals Value Taken Time  BP    Temp    Pulse    Resp    SpO2      Last Pain:  Vitals:   03/30/21 0740  TempSrc: Oral  PainSc: 0-No pain         Complications: No complications documented.

## 2021-03-30 NOTE — H&P (Signed)
Electrophysiology Office Note   Date:  03/30/2021   ID:  Gordon Phillips, DOB 1946-09-05, MRN [id number]  PCP:  Vivi Barrack, MD  Cardiologist:  Harrell Gave Primary Electrophysiologist: Meridee Score, MD    Chief Complaint: AF   History of Present Illness: Gordon Phillips is a 75 y.o. male who is being seen today for the evaluation of AF at the request of No ref. provider found. Presenting today for electrophysiology evaluation.  He has a history significant for hypertension, sleep apnea on CPAP, MGUS, CVA by imaging, remote pericarditis, atrial flutter status post ablation, and atrial fibrillation.  He was diagnosed with atrial fibrillation and flutter in 2019.  He underwent an atrial flutter ablation 04/23/2019.  On 12/30/2020, he developed palpitations while walking.  He developed chest pain the next day and went to the emergency room and was noted to be in atrial fibrillation.  He had a cardioversion on 01/04/2021.  He felt well for a few days but then went back to having fatigue and dyspnea on exertion.  Today, denies symptoms of palpitations, chest pain, shortness of breath, orthopnea, PND, lower extremity edema, claudication, dizziness, presyncope, syncope, bleeding, or neurologic sequela. The patient is tolerating medications without difficulties. Plan AF ablation today.   Past Medical History:  Diagnosis Date  . ALLERGIC RHINITIS 01/25/2010  . Arthritis    "neck, back, shoulders," (02/20/2017)  . Chronic lower back pain   . High cholesterol   . Hx of cardiovascular stress test    a. Lexiscan Myoview 02/06/13:  EF 59%, no ischemia or scar  . Hx of echocardiogram    a.  Echo 02/03/13:  Mild LVH, mild focal basal septal hypertrophy, EF 50-55%, Gr 1 DD, mild LAE  . HYPERTENSION 01/25/2010  . HYPERTHYROIDISM 01/25/2010   S/P radioactive iodine "back in the 1980s" (02/20/2017)  . Impaired glucose tolerance   . 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 Obesity   . OSA on CPAP    Uses CPAP nightly  . Pericarditis 1980's  . 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  Marland Kitchen Vitiligo    Past Surgical History:  Procedure Laterality Date  . A-FLUTTER ABLATION N/A 04/23/2019   Procedure: A-FLUTTER ABLATION;  Surgeon: Deboraha Sprang, MD;  Location: [place];  Service: Cardiovascular;  Laterality: N/A;  . BIOPSY  09/29/2019   Procedure: BIOPSY;  Surgeon: Thornton Park, MD;  Location: [place];  Service: Gastroenterology;;  . CARDIOVERSION N/A 08/14/2018   Procedure: CARDIOVERSION;  Surgeon: [name], MD;  Location: [place];  Service: Cardiovascular;  Laterality: N/A;  . CARDIOVERSION N/A 01/04/2021   Procedure: CARDIOVERSION;  Surgeon: Pixie Casino, MD;  Location: [facility] ENDOSCOPY;  Service: Cardiovascular;  Laterality: N/A;  . ESOPHAGOGASTRODUODENOSCOPY N/A 09/29/2019   Procedure: ESOPHAGOGASTRODUODENOSCOPY (EGD);  Surgeon: Thornton Park, MD;  Location: [place];  Service: Gastroenterology;  Laterality: N/A;  . INGUINAL HERNIA REPAIR Bilateral   . LUMBAR LAMINECTOMY    . TEE WITHOUT CARDIOVERSION N/A 08/14/2018   Procedure: TRANSESOPHAGEAL ECHOCARDIOGRAM (TEE);  Surgeon: [name], MD;  Location: [facility] ENDOSCOPY;  Service: Cardiovascular;  Laterality: N/A;  . UMBILICAL HERNIA REPAIR       Current Facility-Administered Medications  Medication Dose Route Frequency Provider Last Rate Last Admin  . 0.9 %  sodium chloride infusion   Intravenous Continuous Arelly Whittenberg Hassell Done, MD      . 0.9 %  sodium chloride infusion    Continuous PRN Skeet Latch, MD 20 mL/hr at 03/30/21 0749 500 mL at 03/30/21 3212    Allergies:   Flomax [tamsulosin hcl], Relpax [eletriptan], and Other   Social History:  The patient  reports that he quit smoking about 54 years ago. His smoking use included cigarettes. He has a 4.00 pack-year smoking history. He has never used smokeless tobacco. He  reports current alcohol use of about 1.0 - 2.0 standard drink of alcohol per week. He reports that he does not use drugs.   Family History:  The patient's family history includes Stroke in his father; Sudden death in his brother; Throat cancer in his brother; Thyroid disease in his sister.   ROS:  Please see the history of present illness.   Otherwise, review of systems is positive for none.   All other systems are reviewed and negative.   PHYSICAL EXAM: VS:  BP (!) 151/102   Pulse 65   Temp 98.8 F (37.1 C) (Oral)   Resp 20   SpO2 97%  , BMI There is no height or weight on file to calculate BMI. GEN: Well nourished, well developed, in no acute distress  HEENT: normal  Neck: no JVD, carotid bruits, or masses Cardiac: irregular; no murmurs, rubs, or gallops,no edema  Respiratory:  clear to auscultation bilaterally, normal work of breathing GI: soft, nontender, nondistended, + BS MS: no deformity or atrophy  Skin: warm and dry Neuro:  Strength and sensation are intact Psych: euthymic mood, full affect   Recent Labs: 02/15/2021: ALT 12 03/21/2021: BUN 15; Creatinine, Ser 1.04; Potassium 3.8; Sodium 138 03/28/2021: Hemoglobin 13.2; Platelets 143    Lipid Panel     Component Value Date/Time   CHOL 145 08/26/2019 1424   TRIG 68.0 08/26/2019 1424   HDL 40.40 08/26/2019 1424   CHOLHDL 4 08/26/2019 1424   VLDL 13.6 08/26/2019 1424   LDLCALC 91 08/26/2019 1424   LDLDIRECT 139.2 11/21/2012 1122     Wt Readings from Last 3 Encounters:  02/15/21 124.7 kg  01/17/21 124.8 kg  01/11/21 125.6 kg      Other studies Reviewed: Additional studies/ records that were reviewed today include: TTE 02/07/21  Review of the above records today demonstrates:  1. Left ventricular ejection fraction, by estimation, is 55 to 60%. The  left ventricle has normal function. The left ventricle has no regional  wall motion abnormalities. There is mild concentric left ventricular  hypertrophy of the  basal-septal segment.  Left ventricular diastolic parameters are indeterminate.  2. Right ventricular systolic function is normal. The right ventricular  size is normal. There is mildly elevated pulmonary artery systolic  pressure.  3. The mitral valve is grossly normal. Trivial mitral valve  regurgitation. No evidence of mitral stenosis.  4. The aortic valve is tricuspid. There is mild thickening of the aortic  valve. Aortic valve regurgitation is not visualized. No aortic stenosis is  present.  5. The inferior vena cava is normal in size with greater than 50%  respiratory variability, suggesting right atrial pressure of 3 mmHg.    ASSESSMENT AND PLAN:  1.  Persistent atrial fibrillation/atrial flutter: Gordon Phillips has presented today for surgery, with the diagnosis of atrial fibrillation.  The various methods of treatment have been discussed with the patient and family. After consideration of risks, benefits and other options for treatment, the patient has consented to  Procedure(s): Catheter ablation as a surgical intervention .  Risks include but not limited  to complete heart block, stroke, esophageal damage, nerve damage, bleeding, vascular damage, tamponade, perforation, MI, and death. The patient's history has been reviewed, patient examined, no change in status, stable for surgery.  I have reviewed the patient's chart and labs.  Questions were answered to the patient's satisfaction.    Breyonna Nault Curt Bears, MD 03/30/2021 7:52 AM

## 2021-03-30 NOTE — Progress Notes (Signed)
  Echocardiogram Echocardiogram Transesophageal has been performed.  Gordon Phillips 03/30/2021, 9:20 AM

## 2021-03-30 NOTE — Anesthesia Procedure Notes (Signed)
Procedure Name: Intubation Date/Time: 03/30/2021 11:23 AM Performed by: Janene Harvey, CRNA Pre-anesthesia Checklist: Patient identified, Emergency Drugs available, Suction available and Patient being monitored Patient Re-evaluated:Patient Re-evaluated prior to induction Oxygen Delivery Method: Circle system utilized Preoxygenation: Pre-oxygenation with 100% oxygen Induction Type: IV induction Ventilation: Mask ventilation without difficulty Laryngoscope Size: Miller and 3 Grade View: Grade III Tube type: Oral Tube size: 7.5 mm Number of attempts: 1 Airway Equipment and Method: Stylet and Oral airway Placement Confirmation: ETT inserted through vocal cords under direct vision,  positive ETCO2 and breath sounds checked- equal and bilateral Secured at: 22 cm Tube secured with: Tape Dental Injury: Teeth and Oropharynx as per pre-operative assessment

## 2021-03-30 NOTE — Progress Notes (Signed)
Received patient from Endo procedure alert and oriented X4, skin warm and dry, neuro intact  And no complaints of pain.  Dr Oval Linsey in to talk to patient.  Pt on monitor and call bell in place.

## 2021-03-30 NOTE — Interval H&P Note (Signed)
History and Physical Interval Note:  03/30/2021 8:12 AM  Gordon Phillips  has presented today for surgery, with the diagnosis of pre ablation.  The various methods of treatment have been discussed with the patient and family. After consideration of risks, benefits and other options for treatment, the patient has consented to  Procedure(s): TRANSESOPHAGEAL ECHOCARDIOGRAM (TEE) (N/A) as a surgical intervention.  The patient's history has been reviewed, patient examined, no change in status, stable for surgery.  I have reviewed the patient's chart and labs.  Questions were answered to the patient's satisfaction.     Skeet Latch, MD

## 2021-03-30 NOTE — CV Procedure (Addendum)
Brief TEE Note  LVEF 40-45%.  Global hypokinesis LA enlarged No LA/LAA thrombus or masses Trivial MR, TR, PR Aortic valve calcification with partial fusion of the left and non-coronary cusps.  Aortic valve sclerosis without stenosis.  Mean gradient 7 mmHg. There are 4 pulmonary veins.  Two on the right and two on the left.  No pulmonary vein stenosis. No ASD or PFO.  For additional details see full report.  Gordon Phillips C. Oval Linsey, MD, [facility] 03/30/2021 8:49 AM

## 2021-03-30 NOTE — Transfer of Care (Signed)
Immediate Anesthesia Transfer of Care Note  Patient: Gordon Phillips  Procedure(s) Performed: ATRIAL FIBRILLATION ABLATION (N/A )  Patient Location: Cath Lab  Anesthesia Type:General  Level of Consciousness: awake, alert  and patient cooperative  Airway & Oxygen Therapy: Patient Spontanous Breathing and Patient connected to nasal cannula oxygen  Post-op Assessment: Report given to RN and Post -op Vital signs reviewed and stable  Post vital signs: Reviewed  Last Vitals:  Vitals Value Taken Time  BP 147/71 03/30/21 1428  Temp    Pulse 64 03/30/21 1429  Resp 20 03/30/21 1429  SpO2 100 % 03/30/21 1429  Vitals shown include unvalidated device data.  Last Pain:  Vitals:   03/30/21 0919  TempSrc:   PainSc: 0-No pain         Complications: No complications documented.

## 2021-03-31 ENCOUNTER — Telehealth: Payer: Self-pay | Admitting: Cardiology

## 2021-03-31 ENCOUNTER — Encounter (HOSPITAL_COMMUNITY): Payer: Self-pay | Admitting: Cardiology

## 2021-03-31 NOTE — Telephone Encounter (Signed)
PT CALLING TO FIND OUT WHEN HE IS ABLE TO REMOVE HIS BANDAGES

## 2021-03-31 NOTE — Anesthesia Postprocedure Evaluation (Signed)
Anesthesia Post Note  Patient: Gordon Phillips  Procedure(s) Performed: TRANSESOPHAGEAL ECHOCARDIOGRAM (TEE) (N/A )     Patient location during evaluation: Endoscopy Anesthesia Type: MAC Level of consciousness: awake and alert Pain management: pain level controlled Vital Signs Assessment: post-procedure vital signs reviewed and stable Respiratory status: spontaneous breathing, nonlabored ventilation, respiratory function stable and patient connected to nasal cannula oxygen Cardiovascular status: stable and blood pressure returned to baseline Postop Assessment: no apparent nausea or vomiting Anesthetic complications: no   No complications documented.  Last Vitals:  Vitals:   03/30/21 1730 03/30/21 1745  BP:    Pulse: 89 90  Resp: 18 15  Temp:    SpO2: 98% 97%    Last Pain:  Vitals:   03/30/21 1529  TempSrc:   PainSc: 0-No pain   Pain Goal: Patients Stated Pain Goal: 8 (03/30/21 1418)                 Catalina Gravel

## 2021-03-31 NOTE — Telephone Encounter (Signed)
Spoke with the patient who states that he doesn't completely remember his conversation with Dr. Curt Bears after his ablation yesterday. He states that he knows he is to start taking colchicine daily but is unsure why. He states that he also talked with the pharmacist about interaction with pravastatin. Pharmacist advised patient how to take medications at different times. Patient states that he is feeling good today and does not have any concerns he was just curious as to reason he was supposed to be taking colchicine.

## 2021-03-31 NOTE — Telephone Encounter (Signed)
Spoke with the patient and advised on taking colchicine for 10 days to prevent pain from potential pericarditis. Patient verbalized understanding.

## 2021-03-31 NOTE — Anesthesia Postprocedure Evaluation (Signed)
Anesthesia Post Note  Patient: Gordon Phillips  Procedure(s) Performed: ATRIAL FIBRILLATION ABLATION (N/A )     Patient location during evaluation: PACU Anesthesia Type: General Level of consciousness: awake and alert Pain management: pain level controlled Vital Signs Assessment: post-procedure vital signs reviewed and stable Respiratory status: spontaneous breathing, nonlabored ventilation, respiratory function stable and patient connected to nasal cannula oxygen Cardiovascular status: blood pressure returned to baseline and stable Postop Assessment: no apparent nausea or vomiting Anesthetic complications: no   No complications documented.  Last Vitals:  Vitals:   03/30/21 1730 03/30/21 1745  BP:    Pulse: 89 90  Resp: 18 15  Temp:    SpO2: 98% 97%    Last Pain:  Vitals:   03/30/21 1529  TempSrc:   PainSc: 0-No pain                 Belenda Cruise P [name]

## 2021-03-31 NOTE — Telephone Encounter (Signed)
Pt c/o medication issue:  1. Name of Medication: colchicine 0.6 MG tablet  2. How are you currently taking this medication (dosage and times per day)?  As prescribed  3. Are you having a reaction (difficulty breathing--STAT)?  No   4. What is your medication issue?  Patient is requesting to speak with Dr. Curt Bears' nurse regarding his post TEE medication instructions. He states he was under anesthesia when his instructions were initially discussed so he just wants to clarify that he understood everything correctly. He states he also has concerns that this medication will interact with pravastatin (PRAVACHOL) 40 MG tablet.

## 2021-03-31 NOTE — Telephone Encounter (Signed)
Returned call to patient he stated he had a ablation with Dr.Camnitz yesterday.He does not remember when he can remove bandages on rt and lf groins.Stated both groins look good,no swelling,no blood noticed.Advised I will send message to [facility] for advice.

## 2021-04-01 NOTE — Telephone Encounter (Signed)
Called patient left message on personal voice mail Dr.Camnitz advised ok to remove bandages.

## 2021-04-21 ENCOUNTER — Other Ambulatory Visit: Payer: Self-pay | Admitting: Cardiology

## 2021-04-21 NOTE — Telephone Encounter (Signed)
Do NOT refill. This was only for 10 days post ablation

## 2021-05-03 ENCOUNTER — Other Ambulatory Visit: Payer: Self-pay

## 2021-05-03 ENCOUNTER — Encounter (HOSPITAL_COMMUNITY): Payer: Self-pay | Admitting: Physician Assistant

## 2021-05-03 ENCOUNTER — Ambulatory Visit (HOSPITAL_COMMUNITY)
Admission: RE | Admit: 2021-05-03 | Discharge: 2021-05-03 | Disposition: A | Discharge status: Home / Self Care | Payer: Medicare HMO | Source: Ambulatory Visit | Attending: Physician Assistant | Admitting: Physician Assistant

## 2021-05-03 VITALS — BP 114/74 | HR 73 | Ht 74.0 in | Wt 268.4 lb

## 2021-05-03 DIAGNOSIS — E669 Obesity, unspecified: Secondary | ICD-10-CM | POA: Insufficient documentation

## 2021-05-03 DIAGNOSIS — G4733 Obstructive sleep apnea (adult) (pediatric): Secondary | ICD-10-CM | POA: Insufficient documentation

## 2021-05-03 DIAGNOSIS — I4892 Unspecified atrial flutter: Secondary | ICD-10-CM | POA: Insufficient documentation

## 2021-05-03 DIAGNOSIS — I491 Atrial premature depolarization: Secondary | ICD-10-CM | POA: Insufficient documentation

## 2021-05-03 DIAGNOSIS — I4819 Other persistent atrial fibrillation: Secondary | ICD-10-CM

## 2021-05-03 DIAGNOSIS — Z87891 Personal history of nicotine dependence: Secondary | ICD-10-CM | POA: Diagnosis not present

## 2021-05-03 DIAGNOSIS — I10 Essential (primary) hypertension: Secondary | ICD-10-CM | POA: Diagnosis not present

## 2021-05-03 DIAGNOSIS — E7800 Pure hypercholesterolemia, unspecified: Secondary | ICD-10-CM | POA: Diagnosis not present

## 2021-05-03 DIAGNOSIS — Z79899 Other long term (current) drug therapy: Secondary | ICD-10-CM | POA: Insufficient documentation

## 2021-05-03 DIAGNOSIS — D6869 Other thrombophilia: Secondary | ICD-10-CM | POA: Diagnosis not present

## 2021-05-03 DIAGNOSIS — Z6834 Body mass index (BMI) 34.0-34.9, adult: Secondary | ICD-10-CM | POA: Diagnosis not present

## 2021-05-03 DIAGNOSIS — Z7901 Long term (current) use of anticoagulants: Secondary | ICD-10-CM | POA: Insufficient documentation

## 2021-05-03 NOTE — Progress Notes (Signed)
Primary Care Physician: Vivi Barrack, MD Primary Cardiologist: Dr Harrell Gave  Primary Electrophysiologist: Dr Caryl Comes Referring Physician: [facility]   Gordon Phillips is a 75 y.o. male with a history of HTN, OSA w/CPAP, MUGUS, CVA (by imaging), remote pericarditis, CBP, vitiligo and migraines, atrial flutter s/p ablation, and atrial fibrillation who presents for follow up in the [facility]. The patient was initially diagnosed with atrial fibrillation and atrial flutter in  2019. He underwent flutter ablation with Dr Caryl Comes on 04/23/19. Patient is on Eliquis for a CHADS2VASC score of 4. He was in his usual state of health until 12/20/20 when he had tachypalpitations while walking. The next day, his palpitations worsened and he also developed chest pain and presented to the ED. ECG showed rapid afib. He was not cardioverted at that time because it was unclear if he was paroxysmal or persistent. He denies any significant alcohol use. Patient is s/p DCCV on 01/04/21. He reports that initially he felt "100%" but did have fatigue with exertion a few days later and was found to be in atrial flutter.  On follow up today, patient is s/p afib ablation with Dr Curt Bears on 03/30/21. Patient reports that he has done well since the ablation. He has had some brief episodes of afib but this is improving. He denies any CP, swallowing pain, or groin issues.   Today, he denies symptoms of chest pain, shortness of breath, orthopnea, PND, lower extremity edema, dizziness, presyncope, syncope, bleeding, or neurologic sequela. The patient is tolerating medications without difficulties and is otherwise without complaint today.    Atrial Fibrillation Risk Factors:  he does have symptoms or diagnosis of sleep apnea. he is compliant with CPAP therapy. he does not have a history of rheumatic fever. he does not have a history of alcohol use. The patient does not have a history of early familial  atrial fibrillation or other arrhythmias.  he has a BMI of Body mass index is 34.46 kg/m.Marland Kitchen Filed Weights   05/03/21 1433  Weight: 121.7 kg     Family History  Problem Relation Age of Onset   Stroke Father        Passed away in his 88s   Thyroid disease Sister        goiter   Sudden death Brother        ? Drug use, no autopsy   Throat cancer Brother    Colon cancer Neg Hx    Esophageal cancer Neg Hx    Rectal cancer Neg Hx    Stomach cancer Neg Hx      Atrial Fibrillation Management history:  Previous antiarrhythmic drugs: none Previous cardioversions: 2019, 01/04/21 Previous ablations: 04/23/19, 03/30/21 CHADS2VASC score: 4 Anticoagulation history: Eliquis   Past Medical History:  Diagnosis Date   ALLERGIC RHINITIS 01/25/2010   Arthritis    "neck, back, shoulders," (02/20/2017)   Chronic lower back pain    High cholesterol    Hx of cardiovascular stress test    a. [facility] 02/06/13:  EF 59%, no ischemia or scar   Hx of echocardiogram    a.  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   Vitiligo    Past Surgical History:  Procedure Laterality Date   A-FLUTTER ABLATION N/A 04/23/2019   Procedure: A-FLUTTER ABLATION;  Surgeon: Deboraha Sprang, MD;  Location: [place] [facility];  Service: Cardiovascular;  Laterality: N/A;   ATRIAL FIBRILLATION ABLATION N/A 03/30/2021   Procedure: ATRIAL FIBRILLATION ABLATION;  Surgeon: Constance Haw, MD;  Location: [place] [facility];  Service: Cardiovascular;  Laterality: N/A;   BIOPSY  09/29/2019   Procedure: BIOPSY;  Surgeon:  Thornton Park, MD;  Location: [place];  Service: Gastroenterology;;   CARDIOVERSION N/A 08/14/2018   Procedure: [id number];  Surgeon: [name], MD;  Location: [facility] ENDOSCOPY;  Service: Cardiovascular;  Laterality: N/A;   CARDIOVERSION N/A 01/04/2021   Procedure: CARDIOVERSION;  Surgeon: Pixie Casino, MD;  Location: [facility] ENDOSCOPY;  Service: Cardiovascular;  Laterality: N/A;   ESOPHAGOGASTRODUODENOSCOPY N/A 09/29/2019   Procedure: ESOPHAGOGASTRODUODENOSCOPY (EGD);  Surgeon: Thornton Park, MD;  Location: [place];  Service: Gastroenterology;  Laterality: N/A;   INGUINAL HERNIA REPAIR Bilateral    LUMBAR LAMINECTOMY     TEE WITHOUT CARDIOVERSION N/A 08/14/2018   Procedure: TRANSESOPHAGEAL ECHOCARDIOGRAM (TEE);  Surgeon: [name], MD;  Location: [facility] ENDOSCOPY;  Service: Cardiovascular;  Laterality: N/A;   TEE WITHOUT CARDIOVERSION N/A 03/30/2021   Procedure: TRANSESOPHAGEAL ECHOCARDIOGRAM (TEE);  Surgeon: Skeet Latch, MD;  Location: [facility] ENDOSCOPY;  Service: Cardiovascular;  Laterality: N/A;   UMBILICAL HERNIA REPAIR      Current Outpatient Medications  Medication Sig Dispense Refill   acetaminophen (TYLENOL) 650 MG CR tablet Take 1,300 mg by mouth daily as needed for pain.     Aloe-Sodium Chloride (AYR SALINE NASAL GEL NA) Place 1 application into the nose at bedtime as needed (congestion).     apixaban (ELIQUIS) 5 MG TABS tablet Take 1 tablet (5 mg total) by mouth 2 (two) times daily. 180 tablet 2   diltiazem (CARDIZEM CD) 120 MG 24 hr capsule TAKE 1 CAPSULE BY MOUTH EVERY DAY 90 capsule 3   doxazosin (CARDURA) 2 MG tablet TAKE 1 TABLET (2 MG TOTAL) BY MOUTH AT BEDTIME. 90 tablet 2   finasteride (PROSCAR) 5 MG tablet Take 5 mg by mouth in the morning.     furosemide (LASIX) 20 MG tablet Take 1 tablet (20 mg total) by mouth daily. 3 tablet 0   hydrocortisone cream 1 % Apply 1 application topically 2 (two) times daily as needed for itching.     losartan (COZAAR) 50  MG tablet TAKE 1 TABLET BY MOUTH EVERY DAY 90 tablet 1   metoprolol succinate (TOPROL-XL) 100 MG 24 hr tablet TAKE 1 TABLET (100 MG TOTAL) BY MOUTH AT BEDTIME. TAKE WITH OR IMMEDIATELY FOLLOWING A MEAL. 90 tablet 3   Misc Natural Products (PROSTATE THERAPY COMPLEX PO) Take 1 capsule by mouth in the morning. TruNature Prostate Plus Health Complex     OVER THE COUNTER MEDICATION Take 1 capsule by mouth at bedtime. OTC - Super Beta Prostate     pravastatin (PRAVACHOL) 40 MG tablet TAKE 1 TABLET BY MOUTH EVERYDAY AT BEDTIME 90 tablet 3   PRESCRIPTION MEDICATION Inhale into the lungs at bedtime. CPAP     sildenafil (VIAGRA) 100 MG tablet Take 100 mg by mouth daily as needed for erectile dysfunction.      sodium chloride (OCEAN) 0.65 % nasal spray  Place 1 spray into the nose as needed for congestion.     vitamin C (ASCORBIC ACID) 500 MG tablet Take 500 mg by mouth 3 (three) times a week.     No current facility-administered medications for this encounter.    Allergies  Allergen Reactions   Flomax [Tamsulosin Hcl] Itching   Relpax [Eletriptan] Other (See Comments) and Hypertension    Chest pain and sweating   Other Palpitations    Any steroids cause heart palpitations  (tolerates low doses)    Social History   Socioeconomic History   Marital status: Married    Spouse name: Not on file   Number of children: 2   Years of education: Not on file   Highest education level: Not on file  Occupational History   Occupation: Press photographer   Occupation: retired  Tobacco Use   Smoking status: Former    Packs/day: 0.50    Years: 8.00    Pack years: 4.00    Types: Cigarettes    Quit date: 1968    Years since quitting: 54.5   Smokeless tobacco: Never  Vaping Use   Vaping Use: Never used  Substance and Sexual Activity   Alcohol use: Yes    Alcohol/week: 1.0 - 2.0 standard drink    Types: 1 - 2 Shots of liquor per week    Comment: 02/20/2017 "couple drinks/month on average"   Drug use: No   Sexual  activity: Yes  Other Topics Concern   Not on file  Social History Narrative   Not on file   Social Determinants of Health   Financial Resource Strain: Low Risk    Difficulty of Paying Living Expenses: Not hard at all  Food Insecurity: No Food Insecurity   Worried About Charity fundraiser in the Last Year: Never true   [place] in the Last Year: Never true  Transportation Needs: No Transportation Needs   Lack of Transportation (Medical): No   Lack of Transportation (Non-Medical): No  Physical Activity: Insufficiently Active   Days of Exercise per Week: 2 days   Minutes of Exercise per Session: 30 min  Stress: No Stress Concern Present   Feeling of Stress : Not at all  Social Connections: Moderately Isolated   Frequency of Communication with Friends and Family: More than three times a week   Frequency of Social Gatherings with Friends and Family: More than three times a week   Attends Religious Services: Never   Marine scientist or Organizations: No   Attends Music therapist: Never   Marital Status: Married  Human resources officer Violence: Not At Risk   Fear of Current or Ex-Partner: No   Emotionally Abused: No   Physically Abused: No   Sexually Abused: No     ROS- All systems are reviewed and negative except as per the HPI above.  Physical Exam: Vitals:   05/03/21 1433  BP: 114/74  Pulse: 73  Weight: 121.7 kg  Height: 6\' 2"  (1.88 m)    GEN- The patient is a well appearing obese male, alert and oriented x 3 today.   HEENT-head normocephalic, atraumatic, sclera clear, conjunctiva pink, hearing intact, trachea midline. Lungs- Clear to ausculation bilaterally, normal work of breathing Heart- Regular rate and rhythm, no murmurs, rubs or gallops  GI- soft, NT, ND, + BS Extremities- no clubbing, cyanosis, or edema MS- no significant deformity or atrophy Skin- no rash or lesion Psych- euthymic mood, full affect Neuro- strength and sensation  are  intact   Wt Readings from Last 3 Encounters:  05/03/21 121.7 kg  03/30/21 120.2 kg  02/15/21 124.7 kg    EKG today demonstrates  SR, 1st degree AV block, PAC Vent. rate 73 BPM PR interval 258 ms QRS duration 92 ms QT/QTcB 392/431 ms  Echo 02/07/21 demonstrated   1. Left ventricular ejection fraction, by estimation, is 55 to 60%. The  left ventricle has normal function. The left ventricle has no regional  wall motion abnormalities. There is mild concentric left ventricular  hypertrophy of the basal-septal segment.  Left ventricular diastolic parameters are indeterminate.   2. Right ventricular systolic function is normal. The right ventricular  size is normal. There is mildly elevated pulmonary artery systolic  pressure.   3. The mitral valve is grossly normal. Trivial mitral valve  regurgitation. No evidence of mitral stenosis.   4. The aortic valve is tricuspid. There is mild thickening of the aortic  valve. Aortic valve regurgitation is not visualized. No aortic stenosis is  present.   5. The inferior vena cava is normal in size with greater than 50%  respiratory variability, suggesting right atrial pressure of 3 mmHg.   Comparison(s): A prior study was performed on 08/13/2018. Prior images  reviewed side by side. Slight increase in aortic valve thickness. Study  done at lower rates from prior.   Epic records are reviewed at length today  CHA2DS2-VASc Score = 5  The patient's score is based upon: CHF History: Yes HTN History: Yes Diabetes History: No Stroke History: Yes Vascular Disease History: No Age Score: 1 Gender Score: 0      ASSESSMENT AND PLAN: 1. Persistent Atrial Fibrillation/atrial flutter The patient's CHA2DS2-VASc score is 5, indicating a 7.2% annual risk of stroke. S/p afib ablation on 03/30/21 Reassured patient that some breakthrough episodes post ablation are not uncommon.  Continue Eliquis 5 mg BID with no missed doses for at least 3 months post  ablation.  Continue Toprol 100 mg daily Continue diltiazem 120 mg daily  2. Secondary Hypercoagulable State (ICD10:  D68.69) The patient is at significant risk for stroke/thromboembolism based upon his CHA2DS2-VASc Score of 5.  Continue Apixaban (Eliquis).   3. Obesity Body mass index is 34.46 kg/m. Lifestyle modification was discussed and encouraged including regular physical activity and weight reduction.  4. Obstructive sleep apnea Patient reports compliance with CPAP therapy.  5. HTN Stable, no changes today.   Follow up with Dr Curt Bears as scheduled.    [facility] [address] [place], [place] [address] [phone] 05/03/2021 4:53 PM

## 2021-05-17 ENCOUNTER — Ambulatory Visit (INDEPENDENT_AMBULATORY_CARE_PROVIDER_SITE_OTHER): Payer: Medicare HMO | Admitting: Internal Medicine

## 2021-05-17 ENCOUNTER — Encounter: Payer: Self-pay | Admitting: Internal Medicine

## 2021-05-17 ENCOUNTER — Other Ambulatory Visit: Payer: Self-pay

## 2021-05-17 VITALS — BP 128/90 | HR 64 | Ht 74.0 in | Wt 270.0 lb

## 2021-05-17 DIAGNOSIS — Z8639 Personal history of other endocrine, nutritional and metabolic disease: Secondary | ICD-10-CM

## 2021-05-17 DIAGNOSIS — R7303 Prediabetes: Secondary | ICD-10-CM

## 2021-05-17 DIAGNOSIS — E7849 Other hyperlipidemia: Secondary | ICD-10-CM | POA: Diagnosis not present

## 2021-05-17 LAB — POCT GLYCOSYLATED HEMOGLOBIN (HGB A1C): Hemoglobin A1C: 6 % — AB (ref 4.0–5.6)

## 2021-05-17 LAB — LIPID PANEL
Cholesterol: 151 mg/dL (ref 0–200)
HDL: 50.9 mg/dL (ref >39.00)
LDL Cholesterol: 90 mg/dL (ref 0–99)
NonHDL: 100.14
Total CHOL/HDL Ratio: 3
Triglycerides: 49 mg/dL (ref 0.0–149.0)
VLDL: 9.8 mg/dL (ref 0.0–40.0)

## 2021-05-17 LAB — TSH: TSH: 0.83 u[IU]/mL (ref 0.35–5.50)

## 2021-05-17 LAB — T4, FREE: Free T4: 0.78 ng/dL (ref 0.60–1.60)

## 2021-05-17 LAB — T3, FREE: T3, Free: 2.7 pg/mL (ref 2.3–4.2)

## 2021-05-17 MED ORDER — ONETOUCH ULTRASOFT LANCETS MISC
3 refills | Status: DC
Start: 2021-05-17 — End: 2022-10-03

## 2021-05-17 MED ORDER — ONETOUCH VERIO VI STRP: ORAL_STRIP | 3 refills | Status: DC

## 2021-05-17 NOTE — Patient Instructions (Addendum)
Please check sugars once a day or every other day.  Please let me know if the sugars are consistently <70 or >160.  Stop sweet drinks!  Please return in 6 months with your sugar log.

## 2021-05-17 NOTE — Progress Notes (Signed)
Patient ID: Gordon Phillips, male   DOB: 12-14-1945, 75 y.o.   MRN: [id number]   This visit occurred during the SARS-CoV-2 public health emergency.  Safety protocols were in place, including screening questions prior to the visit, additional usage of staff PPE, and extensive cleaning of exam room while observing appropriate contact time as indicated for disinfecting solutions.   HPI: Gordon Phillips is a 75 y.o.-year-old male, initially referred by his PCP, Dr. [name], returning for follow-up for prediabetes, dx in ~2013 and history of Graves' disease.  His wife, Gordon Phillips, is also my patient. Last visit 6 months ago.  Interim history: He had A. fib and several cardioversions since then.  No increased urination, blurry vision, nausea, chest pain. He has back pain  - he had RFA of his back, but pain persist.  Reviewed history: Patient has a history of impaired glucose tolerance/prediabetes, but he was found to have an HbA1c in the diabetic range at last check in 08/2019.   He tells me that he knows why his HbA1c was higher: he was working part-time, but stopped working at the start of the coronavirus pandemic and started to eat more.   After the HbA1c returned high >> he started to change his diet: reduced bread, stopped sweets, increased fruit and veggies. He also tried to restart exercise but hurt his back so he is not taking it slowly.  After the HbA1c returned higher, he was started on Metformin 500 mg daily by PCP but he could not tolerate it due to GI symptoms >> abdominal pain and diarrhea. He would like to avoid medications if possible.  Reviewed HbA1c levels: Lab Results  Component Value Date   HGBA1C 6.0 (A) 11/09/2020   HGBA1C 5.7 (A) 02/02/2020   HGBA1C 6.5 08/26/2019   HGBA1C 6.1 (H) 02/03/2013   HGBA1C 6.2 05/27/2012   He is not on any medication for his prediabetes.  He does not have a meter - lost it. - am: 80-90s - 2h after b'fast: n/c - lunch: n/c - 2h after lunch:  n/c - dinner: n/c - 2h after dinner: n/c - bedtime: n/c It is unclear at which level he has hypoglycemia awareness.  Glucometer: One Touch verio  Pt's meals are: 2 meals a day now - varies: - Breakfast: Oatmeal + fruit (banana + grapes) 3x a week; eggs + grits + bacon/sausage; tuna sandwich or tuna salad; eggs + sardines + toast; granola bar >> more oatmeal + grapes + banana - Lunch: skips or peanuts or PB crackers; [employer] - Dinner:  Salmon or pork chop + veggies + rice or potatoes - Snacks: stopped icecream, juice + sodas, grapes, cookies  -+ Mild CKD, last BUN/creatinine:  Lab Results  Component Value Date   BUN 15 03/21/2021   BUN 15 02/15/2021   CREATININE 1.04 03/21/2021   CREATININE 1.01 02/15/2021  On valsartan.  -+ HL; last set of lipids: Lab Results  Component Value Date   CHOL 145 08/26/2019   HDL 40.40 08/26/2019   LDLCALC 91 08/26/2019   LDLDIRECT 139.2 11/21/2012   TRIG 68.0 08/26/2019   CHOLHDL 4 08/26/2019  On pravastatin 40.  - last eye exam was in 10/2020: No DR reportedly; + ?glaucoma.  - no numbness and tingling in his feet.  Pt has no FH of DM.  He has a history of stroke (left basal ganglia infarct) in 01/2013 -previously on Eliquis, now off.  He also has MGUS, vitiligo, h/o pericarditis.  He has a history  of Graves' disease, s/p RAI treatment in the 76s.  Review latest TFTs: Lab Results  Component Value Date   TSH 0.773 09/29/2019   TSH 0.67 08/26/2019   TSH 1.442 08/12/2018   TSH 0.57 09/18/2017   TSH 0.65 03/20/2016   He is on Toprol-XL.  ROS: + See HPI  Past Medical History:  Diagnosis Date   ALLERGIC RHINITIS 01/25/2010   Arthritis    "neck, back, shoulders," (02/20/2017)   Chronic lower back pain    High cholesterol    Hx of cardiovascular stress test    a. [place] 02/06/13:  EF 59%, no ischemia or scar   Hx of echocardiogram    a.  [place] 02/03/13:  Mild LVH, mild focal basal septal hypertrophy, EF 50-55%, Gr 1 DD,  mild LAE   HYPERTENSION 01/25/2010   HYPERTHYROIDISM 01/25/2010   S/P radioactive iodine "back in the 1980s" (02/20/2017)   Impaired glucose tolerance    MGUS (monoclonal gammopathy of unknown significance)    "an autoimmune thing"   Obesity    OSA on CPAP    Uses CPAP nightly   Pericarditis 1980's   Stroke ([place])    Old L basal ganglia infarct by CT 01/2013.   Vitiligo    Past Surgical History:  Procedure Laterality Date   A-FLUTTER ABLATION N/A 04/23/2019   Procedure: A-FLUTTER ABLATION;  Surgeon: Deboraha Sprang, MD;  Location: [facility];  Service: Cardiovascular;  Laterality: N/A;   ATRIAL FIBRILLATION ABLATION N/A 03/30/2021   Procedure: ATRIAL FIBRILLATION ABLATION;  Surgeon: Constance Haw, MD;  Location: [facility];  Service: Cardiovascular;  Laterality: N/A;   BIOPSY  09/29/2019   Procedure: BIOPSY;  Surgeon: Thornton Park, MD;  Location: [place];  Service: Gastroenterology;;   CARDIOVERSION N/A 08/14/2018   Procedure: [id number];  Surgeon: [name], MD;  Location: [facility] ENDOSCOPY;  Service: Cardiovascular;  Laterality: N/A;   CARDIOVERSION N/A 01/04/2021   Procedure: CARDIOVERSION;  Surgeon: Pixie Casino, MD;  Location: [facility]OS[facility];  Service: Cardiovascular;  Laterality: N/A;   ESOPHAGOGASTRODUODENOSCOPY N/A 09/29/2019   Procedure: ESOPHAGOGASTRODUODENOSCOPY (EGD);  Surgeon: Thornton Park, MD;  Location: [place];  Service: Gastroenterology;  Laterality: N/A;   INGUINAL HERNIA REPAIR Bilateral    LUMBAR LAMINECTOMY     TEE WITHOUT CARDIOVERSION N/A 08/14/2018   Procedure: TRANSESOPHAGEAL ECHOCARDIOGRAM (TEE);  Surgeon: [name], MD;  Location[facility];  Service: Cardiovascular;  Laterality: N/A;   TEE WITHOUT CARDIOVERSION N/A 03/30/2021   Procedure: TRANSESOPHAGEAL ECHOCARDIOGRAM (TEE);  Surgeon: Skeet Latch, MD;  Location: [facility] ENDOSCOPY;  Service: Cardiovascular;  Laterality: N/A;   UMBILICAL HERNIA REPAIR      Social History   Socioeconomic History   Marital status: Married    Spouse name: Not on file   Number of children: 2   Years of education: Not on file   Highest education level: Not on file  Occupational History   Occupation: sales   Occupation: retired  Tobacco Use   Smoking status: Former    Packs/day: 0.50    Years: 8.00    Pack years: 4.00    Types: Cigarettes    Quit date: 1968    Years since quitting: 54.5   Smokeless tobacco: Never  Vaping Use   Vaping Use: Never used  Substance and Sexual Activity   Alcohol use: Yes    Alcohol/week: 1.0 - 2.0 standard drink    Types: 1 - 2 Shots of liquor per week  Comment: 02/20/2017 "couple drinks/month on average"   Drug use: No   Sexual activity: Yes  Other Topics Concern   Not on file  Social History Narrative   Not on file   Social Determinants of Health   Financial Resource Strain: Low Risk    Difficulty of Paying Living Expenses: Not hard at all  Food Insecurity: No Food Insecurity   Worried About Charity fundraiser in the Last Year: Never true   [place] in the Last Year: Never true  Transportation Needs: No Transportation Needs   Lack of Transportation (Medical): No   Lack of Transportation (Non-Medical): No  Physical Activity: Insufficiently Active   Days of Exercise per Week: 2 days   Minutes of Exercise per Session: 30 min  Stress: No Stress Concern Present   Feeling of Stress : Not at all  Social Connections: Moderately Isolated   Frequency of Communication with Friends and Family: More than three times a week   Frequency of Social Gatherings with Friends and Family: More than three times a week   Attends Religious Services: Never   Marine scientist or Organizations: No   Attends Music therapist: Never   Marital Status: Married  Human resources officer Violence: Not At Risk   Fear of Current or Ex-Partner: No   Emotionally Abused: No   Physically Abused: No   Sexually Abused:  No   Current Outpatient Medications on File Prior to Visit  Medication Sig Dispense Refill   acetaminophen (TYLENOL) 650 MG CR tablet Take 1,300 mg by mouth daily as needed for pain.     Aloe-Sodium Chloride (AYR SALINE NASAL GEL NA) Place 1 application into the nose at bedtime as needed (congestion).     apixaban (ELIQUIS) 5 MG TABS tablet Take 1 tablet (5 mg total) by mouth 2 (two) times daily. 180 tablet 2   diltiazem (CARDIZEM CD) 120 MG 24 hr capsule TAKE 1 CAPSULE BY MOUTH EVERY DAY 90 capsule 3   doxazosin (CARDURA) 2 MG tablet TAKE 1 TABLET (2 MG TOTAL) BY MOUTH AT BEDTIME. 90 tablet 2   finasteride (PROSCAR) 5 MG tablet Take 5 mg by mouth in the morning.     furosemide (LASIX) 20 MG tablet Take 1 tablet (20 mg total) by mouth daily. 3 tablet 0   hydrocortisone cream 1 % Apply 1 application topically 2 (two) times daily as needed for itching.     losartan (COZAAR) 50 MG tablet TAKE 1 TABLET BY MOUTH EVERY DAY 90 tablet 1   metoprolol succinate (TOPROL-XL) 100 MG 24 hr tablet TAKE 1 TABLET (100 MG TOTAL) BY MOUTH AT BEDTIME. TAKE WITH OR IMMEDIATELY FOLLOWING A MEAL. 90 tablet 3   Misc Natural Products (PROSTATE THERAPY COMPLEX PO) Take 1 capsule by mouth in the morning. TruNature Prostate Plus Health Complex     OVER THE COUNTER MEDICATION Take 1 capsule by mouth at bedtime. OTC - Super Beta Prostate     pravastatin (PRAVACHOL) 40 MG tablet TAKE 1 TABLET BY MOUTH EVERYDAY AT BEDTIME 90 tablet 3   PRESCRIPTION MEDICATION Inhale into the lungs at bedtime. CPAP     sildenafil (VIAGRA) 100 MG tablet Take 100 mg by mouth daily as needed for erectile dysfunction.      sodium chloride (OCEAN) 0.65 % nasal spray Place 1 spray into the nose as needed for congestion.     vitamin C (ASCORBIC ACID) 500 MG tablet Take 500 mg by mouth 3 (three)  times a week.     No current facility-administered medications on file prior to visit.   Allergies  Allergen Reactions   Flomax [Tamsulosin Hcl] Itching    Relpax [Eletriptan] Other (See Comments) and Hypertension    Chest pain and sweating   Other Palpitations    Any steroids cause heart palpitations  (tolerates low doses)   Family History  Problem Relation Age of Onset   Stroke Father        Passed away in his 75s   Thyroid disease Sister        goiter   Sudden death Brother        ? Drug use, no autopsy   Throat cancer Brother    Colon cancer Neg Hx    Esophageal cancer Neg Hx    Rectal cancer Neg Hx    Stomach cancer Neg Hx    PE: BP 128/90 (BP Location: Right Arm, Patient Position: Sitting, Cuff Size: Normal)   Pulse 64   Ht 6\' 2"  (1.88 m)   Wt 270 lb (122.5 kg)   SpO2 95%   BMI 34.67 kg/m  Wt Readings from Last 3 Encounters:  05/17/21 270 lb (122.5 kg)  05/03/21 268 lb 6.4 oz (121.7 kg)  03/30/21 265 lb (120.2 kg)   Constitutional: overweight, in NAD Eyes: PERRLA, EOMI, no exophthalmos ENT: moist mucous membranes, no thyromegaly, no cervical lymphadenopathy Cardiovascular: RRR, No MRG Respiratory: CTA B Gastrointestinal: abdomen soft, NT, ND, BS+ Musculoskeletal: no deformities, strength intact in all 4 Skin: moist, warm, + vitiligo rash over arms and neck, legs Neurological: + tremor with outstretched left hand, DTR normal in all 4  ASSESSMENT: 1.  Prediabetes  2.  History of Graves' disease  3.  Hyperlipidemia  PLAN:  1. Patient with history of prediabetes, not on diabetic medications.  At last visit, HbA1c was slightly higher, at 6.0%.  At that time, he was more sedentary but was planning to have back surgery and then to become more active. -At this visit, he is still not checking sugars as he does not have a meter.  We gave him a One Touch Verio Flex today and sent prescription for test strips and lancets to his pharmacy.  I strongly advised him to check every day or every other day. - I suggested to:  Patient Instructions  Please check sugars once a day or every other day.  Please let me know if the  sugars are consistently <70 or >160.  Stop sweet drinks!  Please return in 6 months with your sugar log.   - we checked his HbA1c: 6.0% (stable) - advised for yearly eye exams >> he is UTD - return to clinic in 6 months  2. H/o Graves' disease -No signs or symptoms of thyrotoxicosis. He lost 5 lbs since last OV. -He is status post RAI treatment in the 1980s -We are following him with yearly TFTs -Normal latest TFTs were in 2020: Lab Results  Component Value Date   TSH 0.773 09/29/2019  -At last visit, he did not stop at the lab to have repeat tests.  We will check them today.  3.  Hyperlipidemia - Reviewed latest lipid panel  Lab Results  Component Value Date   CHOL 145 08/26/2019   HDL 40.40 08/26/2019   LDLCALC 91 08/26/2019   LDLDIRECT 139.2 11/21/2012   TRIG 68.0 08/26/2019   CHOLHDL 4 08/26/2019  - Continues pravastatin 40 mg daily without side effects -He is due for another lipid  panel-we will check today (nonfasting)  Component     Latest Ref Rng & Units 05/17/2021  Cholesterol     0 - 200 mg/dL 151  Triglycerides     0.0 - 149.0 mg/dL 49.0  HDL Cholesterol     >39.00 mg/dL 50.90  VLDL     0.0 - 40.0 mg/dL 9.8  LDL (calc)     0 - 99 mg/dL 90  Total CHOL/HDL Ratio      3  NonHDL      100.14  TSH     0.35 - 5.50 uIU/mL 0.83  Hemoglobin A1C     4.0 - 5.6 % 6.0 (A)  T4,Free(Direct)     0.60 - 1.60 ng/dL 0.78  Triiodothyronine,Free,Serum     2.3 - 4.2 pg/mL 2.7   Tests are at goal.  Philemon Kingdom, MD PhD [facility] Endocrinology

## 2021-06-14 ENCOUNTER — Ambulatory Visit: Payer: Medicare HMO | Admitting: Internal Medicine

## 2021-06-17 ENCOUNTER — Telehealth (INDEPENDENT_AMBULATORY_CARE_PROVIDER_SITE_OTHER): Payer: Medicare HMO | Admitting: Adult Health

## 2021-06-17 ENCOUNTER — Encounter: Payer: Self-pay | Admitting: Adult Health

## 2021-06-17 ENCOUNTER — Telehealth: Payer: Self-pay | Admitting: Pulmonary Disease

## 2021-06-17 DIAGNOSIS — U071 COVID-19: Secondary | ICD-10-CM

## 2021-06-17 HISTORY — DX: COVID-19: U07.1

## 2021-06-17 MED ORDER — MOLNUPIRAVIR EUA 200MG CAPSULE: 4.0000 | ORAL_CAPSULE | Freq: Two times a day (BID) | ORAL | 0 refills | Status: AC

## 2021-06-17 NOTE — Telephone Encounter (Signed)
Called and spoke with pt letting him know that TP wants [place] to schedule him for a video visit and he verbalized understanding. Appt has been scheduled for pt today at 4pm with TP. Nothing further needed.

## 2021-06-17 NOTE — Progress Notes (Signed)
Virtual Visit via Video Note  I connected with Gordon Phillips on 06/17/21 at  4:00 PM EDT by a video enabled telemedicine application and verified that I am speaking with the correct person using two identifiers.  Location: Patient: Home  Provider: Office    I discussed the limitations of evaluation and management by telemedicine and the availability of in person appointments. The patient expressed understanding and agreed to proceed.  History of Present Illness: 75 year old male followed for obstructive sleep apnea Medical history significant for A. fib on Eliquis with previous ablation, history of esophagitis  Today's video visit is a acute visit for COVID-19 infection.  Patient says that 3 days ago he started developing cold-like symptoms.  His wife had also been sick a few days earlier and she tested positive for COVID-19.  Patient took a COVID test on initial day of symptoms that was negative at home.  He took another home test today and it was positive.  Patient has been vaccinated for COVID-19 x3.  Patient has no known pulmonary issues.  Denies asthma or COPD history.  Patient denies any hemoptysis, chest pain, syncope, orthopnea, edema.  No calf pain.  Patient says he is eating and drinking well.  Patient has multiple comorbidities and medications.  We discussed treatment including antiviral pack. He denies any shortness of breath  Past Medical History:  Diagnosis Date   ALLERGIC RHINITIS 01/25/2010   Arthritis    "neck, back, shoulders," (02/20/2017)   Chronic lower back pain    High cholesterol    Hx of cardiovascular stress test    a. [place] 02/06/13:  EF 59%, no ischemia or scar   Hx of echocardiogram    a.  [place] 02/03/13:  Mild LVH, mild focal basal septal hypertrophy, EF 50-55%, Gr 1 DD, mild LAE   HYPERTENSION 01/25/2010   HYPERTHYROIDISM 01/25/2010   S/P radioactive iodine "back in the 1980s" (02/20/2017)   Impaired glucose tolerance    MGUS (monoclonal gammopathy of  unknown significance)    "an autoimmune thing"   Obesity    OSA on CPAP    Uses CPAP nightly   Pericarditis 1980's   Stroke ([place])    Old L basal ganglia infarct by CT 01/2013.   Vitiligo     Current Outpatient Medications on File Prior to Visit  Medication Sig Dispense Refill   acetaminophen (TYLENOL) 650 MG CR tablet Take 1,300 mg by mouth daily as needed for pain.     Aloe-Sodium Chloride (AYR SALINE NASAL GEL NA) Place 1 application into the nose at bedtime as needed (congestion).     apixaban (ELIQUIS) 5 MG TABS tablet Take 1 tablet (5 mg total) by mouth 2 (two) times daily. 180 tablet 2   diltiazem (CARDIZEM CD) 120 MG 24 hr capsule TAKE 1 CAPSULE BY MOUTH EVERY DAY 90 capsule 3   finasteride (PROSCAR) 5 MG tablet Take 5 mg by mouth in the morning.     furosemide (LASIX) 20 MG tablet Take 1 tablet (20 mg total) by mouth daily. 3 tablet 0   glucose blood (ONETOUCH VERIO) test strip Use as instructed to check blood sugar once daily 100 each 3   hydrocortisone cream 1 % Apply 1 application topically 2 (two) times daily as needed for itching.     Lancets (ONETOUCH ULTRASOFT) lancets Use as instructed to check blood sugar once daily 100 each 3   losartan (COZAAR) 50 MG tablet TAKE 1 TABLET BY MOUTH EVERY DAY 90 tablet 1  metoprolol succinate (TOPROL-XL) 100 MG 24 hr tablet TAKE 1 TABLET (100 MG TOTAL) BY MOUTH AT BEDTIME. TAKE WITH OR IMMEDIATELY FOLLOWING A MEAL. 90 tablet 3   Misc Natural Products (PROSTATE THERAPY COMPLEX PO) Take 1 capsule by mouth in the morning. TruNature Prostate Plus Health Complex     OVER THE COUNTER MEDICATION Take 1 capsule by mouth at bedtime. OTC - Super Beta Prostate     pravastatin (PRAVACHOL) 40 MG tablet TAKE 1 TABLET BY MOUTH EVERYDAY AT BEDTIME 90 tablet 3   PRESCRIPTION MEDICATION Inhale into the lungs at bedtime. CPAP     sildenafil (VIAGRA) 100 MG tablet Take 100 mg by mouth daily as needed for erectile dysfunction.      sodium chloride (OCEAN) 0.65  % nasal spray Place 1 spray into the nose as needed for congestion.     vitamin C (ASCORBIC ACID) 500 MG tablet Take 500 mg by mouth 3 (three) times a week.     doxazosin (CARDURA) 2 MG tablet TAKE 1 TABLET (2 MG TOTAL) BY MOUTH AT BEDTIME. 90 tablet 2   No current facility-administered medications on file prior to visit.     Observations/Objective: Speaks in full sentences.  Appears in no acute distress.  Work of breathing is normal.   04/2014 Mod- AHI 28/h-CPAP 11 cm  Assessment and Plan: COVID-19 infection-patient has been vaccinated for COVID-19 x3.  He has acute respiratory symptoms and is on day 3 of symptoms.  Patient has multiple medications-not a candidate for Paxlovid  Will begin Molnupiravir 5-day antiviral pack.  Patient education given. Patient is to continue to quarantine for at least 5 days or until symptoms have resolved Supportive care with fluids and rest.  Tylenol as needed.  May use Mucinex DM for needed for cough and congestion.  Red flag symptoms reviewed with patient.  Patient is to call if he develops shortness of breath, nausea vomiting, high fevers, shortness of breath.  Or low oxygen levels.  Plan  Patient Instructions  Molnupiravir Pack for 5 days  Quarantine for at least 5 days until symptoms are resolved.  Fluids and rest  Tylenol As needed   Mucinex DM Twice daily  As needed  cough  Claritin daily As needed   Please contact office for sooner follow up if symptoms do not improve or worsen or seek emergency care  Follow up with Primary Care as planned and As needed    Continue on CPAP At bedtime   Work on healthy weight  Do not  Drive if sleepy  CPAP download in 4 weeks  Follow up in 6 months and As needed  With Dr. Elsworth Soho      Follow Up Instructions:    I discussed the assessment and treatment plan with the patient. The patient was provided an opportunity to ask questions and all were answered. The patient agreed with the plan and demonstrated an  understanding of the instructions.   The patient was advised to call back or seek an in-person evaluation if the symptoms worsen or if the condition fails to improve as anticipated.  I provided 25  minutes of non-face-to-face time during this encounter.   Rexene Edison, NP

## 2021-06-17 NOTE — Patient Instructions (Signed)
Molnupiravir Pack for 5 days  Quarantine for at least 5 days until symptoms are resolved.  Fluids and rest  Tylenol As needed   Mucinex DM Twice daily  As needed  cough  Claritin daily As needed   Please contact office for sooner follow up if symptoms do not improve or worsen or seek emergency care  Follow up with Primary Care as planned and As needed    Continue on CPAP At bedtime   Work on healthy weight  Do not  Drive if sleepy  CPAP download in 4 weeks  Follow up in 6 months and As needed  With Dr. Elsworth Soho

## 2021-06-17 NOTE — Telephone Encounter (Signed)
Call made to patient, confirmed DOB.   Patient reports he [facility] but he has not gotten the results. Reports he has developed chills, cough with clear mucous, congestion, and chills. Covid test at home on Tuesday was negative. Denies SOB or chest pain or discomfort. He is only taking tylenol but he takes that on a regular basis due to a chronic pain. He has had theses symptoms for about 3-4 days. Today he developed throat soreness. His wife has tested positive as well, we have sent in medication for her. Requesting recommendations. Noted very congested cough while on phone, patient unable to complete sentence due to coughing and patient think he may be wheezing at times.  He is going to call [facility] to see if test has resulted.   TP please advise. Thanks :)

## 2021-06-17 NOTE — Telephone Encounter (Signed)
Spoke with pt who states home Covid test that was just taken came back as positive. [name] please advise.

## 2021-06-17 NOTE — Telephone Encounter (Signed)
Pt stated that his wife tested positive for Covid-19 and he said that he did a Covid-19 test yesterday but the results are still pending but he currently is having symptoms of; coughing (dry cough stated that there is some clear mucus at times), congestion, chills, states he think he believes he has a fever, stated that he is very fatigued as well. Pt takes that he takes Tylenol regularly and that is the only OTC medication that he has taken.   [id number] [place] [address] [id number]  Pls regard; [phone]

## 2021-06-17 NOTE — Telephone Encounter (Signed)
Double book me at 4 for video visit for him

## 2021-06-17 NOTE — Telephone Encounter (Signed)
Spoke with pt and informed he will need to take a home test today and let us know the results. Will hold encounter open in triage for test results.

## 2021-06-17 NOTE — Telephone Encounter (Signed)
Followed for OSA only ,   Please get him to retest today (home test )  If positive, we can look at antiviral rx for him .  Need a positive test in order to send in .  I will double book my afternoon if he needs to be seen virtual visit .   Otherwise treat symptoms , fluids , rest , mucinex , tylenol  Please contact office for sooner follow up if symptoms do not improve or worsen or seek emergency care

## 2021-06-18 ENCOUNTER — Other Ambulatory Visit: Payer: Self-pay | Admitting: Family Medicine

## 2021-07-12 ENCOUNTER — Ambulatory Visit: Payer: Medicare HMO | Admitting: Cardiology

## 2021-07-12 ENCOUNTER — Encounter: Payer: Self-pay | Admitting: Cardiology

## 2021-07-12 ENCOUNTER — Other Ambulatory Visit: Payer: Self-pay

## 2021-07-12 VITALS — BP 130/76 | HR 72 | Ht 74.0 in | Wt 272.0 lb

## 2021-07-12 DIAGNOSIS — I4819 Other persistent atrial fibrillation: Secondary | ICD-10-CM | POA: Diagnosis not present

## 2021-07-12 NOTE — Progress Notes (Signed)
Electrophysiology Office Note   Date:  07/12/2021   ID:  Gordon Phillips, DOB 1945/11/15, MRN [id number]  PCP:  Vivi Barrack, MD  Cardiologist:  Harrell Gave Primary Electrophysiologist: Meridee Score, MD    Chief Complaint: AF   History of Present Illness: Gordon Phillips is a 75 y.o. male who is being seen today for the evaluation of AF at the request of Vivi Barrack, MD. Presenting today for electrophysiology evaluation.  He has a history significant for hypertension, sleep apnea on CPAP, MGUS, CVA by imaging, remote pericarditis, atrial flutter status post ablation, and atrial fibrillation.  He was diagnosed with atrial fibrillation and flutter in 2019.  He underwent atrial flutter ablation 04/23/2019.  04/29/2021 developed palpitations and was noted to be in atrial fibrillation.  He is now status post ablation 03/30/2021.  Today, denies symptoms of palpitations, chest pain, shortness of breath, orthopnea, PND, lower extremity edema, claudication, dizziness, presyncope, syncope, bleeding, or neurologic sequela. The patient is tolerating medications without difficulties.  Since his ablation he has done well.  Immediately after his ablation, he had a few episodes of atrial fibrillation, but has not had any in the last few months.  He is overall comfortable with his control.  He is able do all of his daily activities.  He has been trying to get more exercise and eat healthy.  He did have questions about erectile dysfunction medications.  I told him it would be okay to use Viagra as he is not on nitrates.  Past Medical History:  Diagnosis Date   ALLERGIC RHINITIS 01/25/2010   Arthritis    "neck, back, shoulders," (02/20/2017)   Chronic lower back pain    High cholesterol    Hx of cardiovascular stress test    a. [place] 02/06/13:  EF 59%, no ischemia or scar   Hx of echocardiogram    a.  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   Vitiligo    Past Surgical History:  Procedure Laterality Date   A-FLUTTER ABLATION N/A 04/23/2019   Procedure: A-FLUTTER ABLATION;  Surgeon: Deboraha Sprang, MD;  Location: [place] [facility];  Service: Cardiovascular;  Laterality: N/A;   ATRIAL FIBRILLATION ABLATION N/A 03/30/2021   Procedure: ATRIAL FIBRILLATION ABLATION;  Surgeon: Constance Haw, MD;  Location: [place] [facility];  Service: Cardiovascular;  Laterality: N/A;   BIOPSY  09/29/2019   Procedure: BIOPSY;  Surgeon: Thornton Park, MD;  Location: [facility];  Service: Gastroenterology;;   CARDIOVERSION N/A 08/14/2018   Procedure: [id number];  Surgeon: [name], MD;  Location: [facility] ENDOSCOPY;  Service: Cardiovascular;  Laterality: N/A;   CARDIOVERSION N/A 01/04/2021   Procedure: CARDIOVERSION;  Surgeon: Pixie Casino, MD;  Location: [facility] ENDOSCOPY;  Service: Cardiovascular;  Laterality: N/A;   ESOPHAGOGASTRODUODENOSCOPY N/A 09/29/2019   Procedure: ESOPHAGOGASTRODUODENOSCOPY (EGD);  Surgeon: Thornton Park, MD;  Location: [place];  Service: Gastroenterology;  Laterality: N/A;   INGUINAL HERNIA REPAIR Bilateral    LUMBAR LAMINECTOMY  TEE WITHOUT CARDIOVERSION N/A 08/14/2018   Procedure: TRANSESOPHAGEAL ECHOCARDIOGRAM (TEE);  Surgeon: [name], MD;  Location: [facility] ENDOSCOPY;  Service: Cardiovascular;  Laterality: N/A;   TEE WITHOUT CARDIOVERSION N/A 03/30/2021   Procedure: TRANSESOPHAGEAL ECHOCARDIOGRAM (TEE);  Surgeon: Skeet Latch, MD;  Location: [facility] ENDOSCOPY;  Service: Cardiovascular;  Laterality: N/A;   UMBILICAL HERNIA REPAIR        Current Outpatient Medications  Medication Sig Dispense Refill   acetaminophen (TYLENOL) 650 MG CR tablet Take 1,300 mg by mouth daily as needed for pain.     Aloe-Sodium Chloride (AYR SALINE NASAL GEL NA) Place 1 application into the nose at bedtime as needed (congestion).     diltiazem (CARDIZEM CD) 120 MG 24 hr capsule TAKE 1 CAPSULE BY MOUTH EVERY DAY 90 capsule 3   doxazosin (CARDURA) 2 MG tablet TAKE 1 TABLET (2 MG TOTAL) BY MOUTH AT BEDTIME. 90 tablet 2   finasteride (PROSCAR) 5 MG tablet Take 5 mg by mouth in the morning.     furosemide (LASIX) 20 MG tablet Take 1 tablet (20 mg total) by mouth daily. 3 tablet 0   glucose blood (ONETOUCH VERIO) test strip Use as instructed to check blood sugar once daily 100 each 3   hydrocortisone cream 1 % Apply 1 application topically 2 (two) times daily as needed for itching.     Lancets (ONETOUCH ULTRASOFT) lancets Use as instructed to check blood sugar once daily 100 each 3   losartan (COZAAR) 50 MG tablet TAKE 1 TABLET BY MOUTH EVERY DAY 90 tablet 1   metoprolol succinate (TOPROL-XL) 100 MG 24 hr tablet TAKE 1 TABLET (100 MG TOTAL) BY MOUTH AT BEDTIME. TAKE WITH OR IMMEDIATELY FOLLOWING A MEAL. 90 tablet 3   Misc Natural Products (PROSTATE THERAPY COMPLEX PO) Take 1 capsule by mouth in the morning. TruNature Prostate Plus Health Complex     OVER THE COUNTER MEDICATION Take 1 capsule by mouth at bedtime. OTC - Super Beta Prostate     pravastatin (PRAVACHOL) 40 MG tablet TAKE 1 TABLET BY MOUTH EVERYDAY AT BEDTIME 90 tablet 3   PRESCRIPTION MEDICATION Inhale into the lungs at bedtime. CPAP     sildenafil (VIAGRA) 100 MG tablet Take 100 mg by mouth daily as needed for erectile dysfunction.      sodium chloride (OCEAN) 0.65 % nasal spray Place 1 spray into the nose as needed for congestion.     vitamin C (ASCORBIC ACID) 500 MG tablet Take 500 mg by mouth 3 (three) times a week.     apixaban (ELIQUIS) 5 MG TABS tablet Take 1 tablet (5 mg total) by  mouth 2 (two) times daily. 180 tablet 2   No current facility-administered medications for this visit.    Allergies:   Flomax [tamsulosin hcl], Relpax [eletriptan], and Other   Social History:  The patient  reports that he quit smoking about 54 years ago. His smoking use included cigarettes. He has a 4.00 pack-year smoking history. He has never used smokeless tobacco. He reports current alcohol use of about 1.0 - 2.0 standard drink per week. He reports that he does not use drugs.   Family History:  The patient's family history includes Stroke in his father; Sudden death in his brother; Throat cancer in his brother; Thyroid disease in his sister.   ROS:  Please see the history of present illness.   Otherwise, review of systems is positive for none.   All other systems are reviewed and negative.  PHYSICAL EXAM: VS:  BP 130/76   Pulse 72   Ht '6\' 2"'$  (1.88 m)   Wt 272 lb (123.4 kg)   SpO2 98%   BMI 34.92 kg/m  , BMI Body mass index is 34.92 kg/m. GEN: Well nourished, well developed, in no acute distress  HEENT: normal  Neck: no JVD, carotid bruits, or masses Cardiac: RRR; no murmurs, rubs, or gallops,no edema  Respiratory:  clear to auscultation bilaterally, normal work of breathing GI: soft, nontender, nondistended, + BS MS: no deformity or atrophy  Skin: warm and dry Neuro:  Strength and sensation are intact Psych: euthymic mood, full affect  EKG:  EKG is ordered today. Personal review of the ekg ordered shows sinus rhythm, rate 72  Recent Labs: 02/15/2021: ALT 12 03/21/2021: BUN 15; Creatinine, Ser 1.04; Potassium 3.8; Sodium 138 03/28/2021: Hemoglobin 13.2; Platelets 143 05/17/2021: TSH 0.83    Lipid Panel     Component Value Date/Time   CHOL 151 05/17/2021 1420   TRIG 49.0 05/17/2021 1420   HDL 50.90 05/17/2021 1420   CHOLHDL 3 05/17/2021 1420   VLDL 9.8 05/17/2021 1420   LDLCALC 90 05/17/2021 1420   LDLDIRECT 139.2 11/21/2012 1122     Wt Readings from Last 3  Encounters:  07/12/21 272 lb (123.4 kg)  05/17/21 270 lb (122.5 kg)  05/03/21 268 lb 6.4 oz (121.7 kg)      Other studies Reviewed: Additional studies/ records that were reviewed today include: TTE 02/07/21  Review of the above records today demonstrates:   1. Left ventricular ejection fraction, by estimation, is 55 to 60%. The  left ventricle has normal function. The left ventricle has no regional  wall motion abnormalities. There is mild concentric left ventricular  hypertrophy of the basal-septal segment.  Left ventricular diastolic parameters are indeterminate.   2. Right ventricular systolic function is normal. The right ventricular  size is normal. There is mildly elevated pulmonary artery systolic  pressure.   3. The mitral valve is grossly normal. Trivial mitral valve  regurgitation. No evidence of mitral stenosis.   4. The aortic valve is tricuspid. There is mild thickening of the aortic  valve. Aortic valve regurgitation is not visualized. No aortic stenosis is  present.   5. The inferior vena cava is normal in size with greater than 50%  respiratory variability, suggesting right atrial pressure of 3 mmHg.    ASSESSMENT AND PLAN:  1.  Persistent atrial fibrillation/flutter: Currently on Eliquis 5 mg twice daily.  CHA2DS2-VASc of 5.  Had multiple episodes of palpitations.  He is now status post ablation 03/30/2021.  Since his ablation he has done well.  He is remained in sinus rhythm.  We Gordon Phillips continue with current management.  2.  Obesity: Diet and exercise encouraged  3.  Struct of sleep apnea: CPAP compliance encouraged  4.  Hypertension: Currently well controlled.  Continue losartan 50 mg daily, Toprol-XL 100 mg daily.  Current medicines are reviewed at length with the patient today.   The patient does not have concerns regarding his medicines.  The following changes were made today: None  Labs/ tests ordered today include:  Orders Placed This Encounter  Procedures    EKG 12-Lead      Disposition:   FU with Gordon Phillips 3 months  Signed, Gordon Ursua Meredith Leeds, MD  07/12/2021 2:44 PM     [place] [address] [place] [address] [phone] (office) [phone] (fax)

## 2021-07-12 NOTE — Patient Instructions (Signed)
Medication Instructions:  Your physician recommends that you continue on your current medications as directed. Please refer to the Current Medication list given to you today.  *If you need a refill on your cardiac medications before your next appointment, please call your pharmacy*   Lab Work: None ordered   Testing/Procedures: None ordered   Follow-Up: At [facility], you and your health needs are our priority.  As part of our continuing mission to provide you with exceptional heart care, we have created designated Provider Care Teams.  These Care Teams include your primary Cardiologist (physician) and Advanced Practice Providers (APPs -  Physician Assistants and Nurse Practitioners) who all work together to provide you with the care you need, when you need it.   Your next appointment:   3 month(s)  The format for your next appointment:   In Person  Provider:   Allegra Lai, MD   Your physician recommends that you schedule a follow-up appointment in: 6 months with Dr. Harrell Gave   Thank you for choosing [facility]!!   Trinidad Curet, RN [phone]

## 2021-08-01 ENCOUNTER — Ambulatory Visit: Payer: Medicare HMO | Admitting: Internal Medicine

## 2021-08-01 ENCOUNTER — Other Ambulatory Visit (INDEPENDENT_AMBULATORY_CARE_PROVIDER_SITE_OTHER): Payer: Medicare HMO

## 2021-08-01 ENCOUNTER — Encounter: Payer: Self-pay | Admitting: Internal Medicine

## 2021-08-01 ENCOUNTER — Ambulatory Visit (INDEPENDENT_AMBULATORY_CARE_PROVIDER_SITE_OTHER): Payer: Medicare HMO | Admitting: Internal Medicine

## 2021-08-01 VITALS — BP 124/70 | HR 64 | Ht 74.0 in | Wt 275.4 lb

## 2021-08-01 DIAGNOSIS — K5900 Constipation, unspecified: Secondary | ICD-10-CM

## 2021-08-01 DIAGNOSIS — R1030 Lower abdominal pain, unspecified: Secondary | ICD-10-CM

## 2021-08-01 LAB — BASIC METABOLIC PANEL
BUN: 13 mg/dL (ref 6–23)
CO2: 29 mEq/L (ref 19–32)
Calcium: 9.5 mg/dL (ref 8.4–10.5)
Chloride: 104 mEq/L (ref 96–112)
Creatinine, Ser: 0.96 mg/dL (ref 0.40–1.50)
GFR: 78.03 mL/min (ref >60.00)
Glucose, Bld: 86 mg/dL (ref 70–99)
Potassium: 3.8 mEq/L (ref 3.5–5.1)
Sodium: 140 mEq/L (ref 135–145)

## 2021-08-01 NOTE — Patient Instructions (Signed)
If you are age 74 or older, your body mass index should be between 23-30. Your Body mass index is 35.36 kg/m. If this is out of the aforementioned range listed, please consider follow up with your Primary Care Provider. __________________________________________________________  The [facility] providers would like to encourage you to use [facility] to communicate with providers for non-urgent requests or questions.  Due to long hold times on the telephone, sending your provider a message by [facility] may be a faster and more efficient way to get a response.  Please allow 48 business hours for a response.  Please remember that this is for non-urgent requests.   You have been scheduled for a CT scan of the abdomen and pelvis at [facility] ([address]---this is in the same building as [employer]).   You are scheduled on 08/04/2021 at 2:30 pm. You should arrive 15 minutes prior to your appointment time for registration. Please follow the written instructions below on the day of your exam:  WARNING: IF YOU ARE ALLERGIC TO IODINE/X-RAY DYE, PLEASE NOTIFY RADIOLOGY IMMEDIATELY AT [phone]! YOU WILL BE GIVEN A 13 HOUR PREMEDICATION PREP.  1) Do not eat anything after 10:30 am (4 hours prior to your test) 2) You have been given 2 bottles of oral contrast to drink. The solution may taste better if refrigerated, but do NOT add ice or any other liquid to this solution. Shake well before drinking.    Drink 1 bottle of contrast @ 12:30 pm (2 hours prior to your exam)  Drink 1 bottle of contrast @ 1:30 pm (1 hour prior to your exam)  You may take any medications as prescribed with a small amount of water, if necessary. If you take any of the following medications: METFORMIN, GLUCOPHAGE, GLUCOVANCE, AVANDAMET, RIOMET, FORTAMET, [place] MET, JANUMET, GLUMETZA or METAGLIP, you MAY be asked to HOLD this medication 48 hours AFTER the exam.  The purpose of you drinking the oral contrast  is to aid in the visualization of your intestinal tract. The contrast solution may cause some diarrhea. Depending on your individual set of symptoms, you may also receive an intravenous injection of x-ray contrast/dye. Plan on being at [facility] for 30 minutes or longer, depending on the type of exam you are having performed.  This test typically takes 30-45 minutes to complete.  If you have any questions regarding your exam or if you need to reschedule, you may call the CT department at [phone] between the hours of 8:00 am and 5:00 pm, Monday-Friday.  ____________________________________________________________  Your provider has requested that you go to the basement level for lab work before leaving today. Press "B" on the elevator. The lab is located at the first door on the left as you exit the elevator.  You may take a Stool Softener as needed to help with Constipation.  Follow up pending the results of your CT.  Thank you for entrusting me with your care and choosing [facility].  Dr Henrene Pastor

## 2021-08-01 NOTE — Progress Notes (Signed)
HISTORY OF PRESENT ILLNESS:  Gordon Phillips is a 75 y.o. male who is self-referred regarding acute low back pain associated with lower abdominal pain and chest pain.  Patient has multiple medical problems as listed below.  I saw him May 14, 2018 for routine screening colonoscopy.  Examination revealed a small tubular adenoma but was otherwise normal.  Follow-up in 5 years recommended.  Patient also underwent upper endoscopy November 2020, while in the hospital, for dysphagia.  He was found to have ulceration of the epiglottis to explain symptoms.  His upper endoscopy was otherwise without significant abnormalities.  Patient tells me that on June 12, 2021 he developed severe low back pain with radiation into the lower abdomen throughout his upper abdomen and chest.  This lasted about 10 minutes.  He did have a sensation of weakness in his legs.  There was no associated change in bowel habits.  He has had no problems since.  He does have significant back problems and has undergone prior lumbar back surgery.  He denies urinary complaints.  No GI bleeding.  No weight loss.  He is on multiple medications including Eliquis.  Patient does tell me that with good dietary habits, his bowel habits are regular.  However, when consuming items such as beef he may become a bit constipated.  This is a little uncomfortable.  Review of blood work from July 2022 shows normal TSH at 0.83.  REVIEW OF SYSTEMS:  All non-GI ROS negative as otherwise stated in the HPI except for back pain  Past Medical History:  Diagnosis Date   ALLERGIC RHINITIS 01/25/2010   Arthritis    "neck, back, shoulders," (02/20/2017)   Chronic lower back pain    High cholesterol    Hx of cardiovascular stress test    a. [place] 02/06/13:  EF 59%, no ischemia or scar   Hx of echocardiogram    a.  [place] 02/03/13:  Mild LVH, mild focal basal septal hypertrophy, EF 50-55%, Gr 1 DD, mild LAE   HYPERTENSION 01/25/2010   HYPERTHYROIDISM 01/25/2010    S/P radioactive iodine "back in the 1980s" (02/20/2017)   Impaired glucose tolerance    MGUS (monoclonal gammopathy of unknown significance)    "an autoimmune thing"   Obesity    OSA on CPAP    Uses CPAP nightly   Pericarditis 1980's   Stroke ([place])    Old L basal ganglia infarct by CT 01/2013.   Vitiligo     Past Surgical History:  Procedure Laterality Date   A-FLUTTER ABLATION N/A 04/23/2019   Procedure: A-FLUTTER ABLATION;  Surgeon: Deboraha Sprang, MD;  Location: [place] [facility];  Service: Cardiovascular;  Laterality: N/A;   ATRIAL FIBRILLATION ABLATION N/A 03/30/2021   Procedure: ATRIAL FIBRILLATION ABLATION;  Surgeon: Constance Haw, MD;  Location: [place] [facility];  Service: Cardiovascular;  Laterality: N/A;   BIOPSY  09/29/2019   Procedure: BIOPSY;  Surgeon: Thornton Park, MD;  Location: [facility];  Service: Gastroenterology;;   CARDIOVERSION N/A 08/14/2018   Procedure: [id number];  Surgeon: [name], MD;  Location: [facility] ENDOSCOPY;  Service: Cardiovascular;  Laterality: N/A;   CARDIOVERSION N/A 01/04/2021   Procedure: CARDIOVERSION;  Surgeon: Pixie Casino, MD;  Location: [facility] ENDOSCOPY;  Service: Cardiovascular;  Laterality: N/A;   ESOPHAGOGASTRODUODENOSCOPY N/A 09/29/2019   Procedure: ESOPHAGOGASTRODUODENOSCOPY (EGD);  Surgeon: Thornton Park, MD;  Location: [place];  Service: Gastroenterology;  Laterality: N/A;   INGUINAL HERNIA REPAIR Bilateral    LUMBAR LAMINECTOMY  TEE WITHOUT CARDIOVERSION N/A 08/14/2018   Procedure: TRANSESOPHAGEAL ECHOCARDIOGRAM (TEE);  Surgeon: [name], MD;  Location: [facility] ENDOSCOPY;  Service: Cardiovascular;  Laterality: N/A;   TEE WITHOUT CARDIOVERSION N/A 03/30/2021   Procedure: TRANSESOPHAGEAL ECHOCARDIOGRAM (TEE);  Surgeon: Skeet Latch, MD;  Location: [place];  Service: Cardiovascular;  Laterality: N/A;   [place]      Social History Gordon Phillips  reports that he quit smoking about  54 years ago. His smoking use included cigarettes. He has a 4.00 pack-year smoking history. He has never used smokeless tobacco. He reports current alcohol use of about 1.0 - 2.0 standard drink per week. He reports that he does not use drugs.  family history includes Stroke in his father; Sudden death in his brother; Throat cancer in his brother; Thyroid disease in his sister.  Allergies  Allergen Reactions   Flomax [Tamsulosin Hcl] Itching   Relpax [Eletriptan] Other (See Comments) and Hypertension    Chest pain and sweating   Other Palpitations    Any steroids cause heart palpitations  (tolerates low doses)       PHYSICAL EXAMINATION: Vital signs: BP 124/70   Pulse 64   Ht 6\' 2"  (1.88 m)   Wt 275 lb 6 oz (124.9 kg)   BMI 35.36 kg/m   Constitutional: Obese, no acute distress Psychiatric: alert and oriented x3, cooperative Eyes: extraocular movements intact, anicteric, conjunctiva pink Mouth: Mask Neck: supple no lymphadenopathy Cardiovascular: heart regular rate and rhythm, no murmur Lungs: clear to auscultation bilaterally Abdomen: soft, obese, nontender, nondistended, no obvious ascites, no peritoneal signs, normal bowel sounds, no organomegaly Rectal: Omitted Extremities: no clubbing or cyanosis.  Trace lower extremity edema bilaterally Skin: Vitiligo involving the arms face and scalp, at least. Neuro: No focal deficits. No asterixis.     ASSESSMENT:  1.  Lower abdominal pain.  I suspect that this is primary back pain with radiation to the abdomen as described. 2.  Occasional constipation 3.  Colonoscopy 2019 with small adenoma 4.  Unremarkable upper endoscopy November 2020 5.  Multiple medical problems   PLAN:  1.  Schedule CT scan of the abdomen pelvis to assess for other causes for acute lower abdominal pain.  If negative, this would support primary back pain with radiation.  We will contact him with the results 2.  Stool softeners as needed 3.  Surveillance  colonoscopy was to be considered in the future.  May not be necessary given minimal findings on his previous exam, current age, updated guidelines, and comorbidities. 4.  Resume general medical care with PCP.  GI follow-up as needed

## 2021-08-04 ENCOUNTER — Ambulatory Visit: Payer: Medicare HMO

## 2021-08-04 ENCOUNTER — Other Ambulatory Visit: Payer: Medicare HMO

## 2021-08-08 ENCOUNTER — Other Ambulatory Visit: Payer: Self-pay

## 2021-08-08 ENCOUNTER — Ambulatory Visit (INDEPENDENT_AMBULATORY_CARE_PROVIDER_SITE_OTHER): Payer: Medicare HMO

## 2021-08-08 DIAGNOSIS — Z0000 Encounter for general adult medical examination without abnormal findings: Secondary | ICD-10-CM

## 2021-08-08 NOTE — Patient Instructions (Addendum)
Gordon Phillips , Thank you for taking time to come for your Medicare Wellness Visit. I appreciate your ongoing commitment to your health goals. Please review the following plan we discussed and let me know if I can assist you in the future.   Screening recommendations/referrals: Colonoscopy: done 05/14/18 repeat every 5 years due 05/15/23 Recommended yearly ophthalmology/optometry visit for glaucoma screening and checkup Recommended yearly dental visit for hygiene and checkup  Vaccinations: Influenza vaccine: due and discussed Pneumococcal vaccine: completed  Tdap vaccine: done 12/14/14 repeat every 10 years due 12/14/24 Shingles vaccine: Shingrix discussed. Please contact your pharmacy for coverage information.    Covid-19: completed 2/11, 3/8, & 10/14/20  Advanced directives: Please bring a copy of your health care power of attorney and living will to the office at your convenience.  Conditions/risks identified: lose weight   Next appointment: Follow up in one year for your annual wellness visit.   Preventive Care 22 Years and Older, Male Preventive care refers to lifestyle choices and visits with your health care provider that can promote health and wellness. What does preventive care include? A yearly physical exam. This is also called an annual well check. Dental exams once or twice a year. Routine eye exams. Ask your health care provider how often you should have your eyes checked. Personal lifestyle choices, including: Daily care of your teeth and gums. Regular physical activity. Eating a healthy diet. Avoiding tobacco and drug use. Limiting alcohol use. Practicing safe sex. Taking low doses of aspirin every day. Taking vitamin and mineral supplements as recommended by your health care provider. What happens during an annual well check? The services and screenings done by your health care provider during your annual well check will depend on your age, overall health, lifestyle risk  factors, and family history of disease. Counseling  Your health care provider may ask you questions about your: Alcohol use. Tobacco use. Drug use. Emotional well-being. Home and relationship well-being. Sexual activity. Eating habits. History of falls. Memory and ability to understand (cognition). Work and work Statistician. Screening  You may have the following tests or measurements: Height, weight, and BMI. Blood pressure. Lipid and cholesterol levels. These may be checked every 5 years, or more frequently if you are over 88 years old. Skin check. Lung cancer screening. You may have this screening every year starting at age 23 if you have a 30-pack-year history of smoking and currently smoke or have quit within the past 15 years. Fecal occult blood test (FOBT) of the stool. You may have this test every year starting at age 41. Flexible sigmoidoscopy or colonoscopy. You may have a sigmoidoscopy every 5 years or a colonoscopy every 10 years starting at age 63. Prostate cancer screening. Recommendations will vary depending on your family history and other risks. Hepatitis C blood test. Hepatitis B blood test. Sexually transmitted disease (STD) testing. Diabetes screening. This is done by checking your blood sugar (glucose) after you have not eaten for a while (fasting). You may have this done every 1-3 years. Abdominal aortic aneurysm (AAA) screening. You may need this if you are a current or former smoker. Osteoporosis. You may be screened starting at age 75 if you are at high risk. Talk with your health care provider about your test results, treatment options, and if necessary, the need for more tests. Vaccines  Your health care provider may recommend certain vaccines, such as: Influenza vaccine. This is recommended every year. Tetanus, diphtheria, and acellular pertussis (Tdap, Td) vaccine. You may  need a Td booster every 10 years. Zoster vaccine. You may need this after age  35. Pneumococcal 13-valent conjugate (PCV13) vaccine. One dose is recommended after age 78. Pneumococcal polysaccharide (PPSV23) vaccine. One dose is recommended after age 72. Talk to your health care provider about which screenings and vaccines you need and how often you need them. This information is not intended to replace advice given to you by your health care provider. Make sure you discuss any questions you have with your health care provider. Document Released: 11/19/2015 Document Revised: 07/12/2016 Document Reviewed: 08/24/2015 Elsevier Interactive Patient Education  2017 [place] Prevention in the Home Falls can cause injuries. They can happen to people of all ages. There are many things you can do to make your home safe and to help prevent falls. What can I do on the outside of my home? Regularly fix the edges of walkways and driveways and fix any cracks. Remove anything that might make you trip as you walk through a door, such as a raised step or threshold. Trim any bushes or trees on the path to your home. Use bright outdoor lighting. Clear any walking paths of anything that might make someone trip, such as rocks or tools. Regularly check to see if handrails are loose or broken. Make sure that both sides of any steps have handrails. Any raised decks and porches should have guardrails on the edges. Have any leaves, snow, or ice cleared regularly. Use sand or salt on walking paths during winter. Clean up any spills in your garage right away. This includes oil or grease spills. What can I do in the bathroom? Use night lights. Install grab bars by the toilet and in the tub and shower. Do not use towel bars as grab bars. Use non-skid mats or decals in the tub or shower. If you need to sit down in the shower, use a plastic, non-slip stool. Keep the floor dry. Clean up any water that spills on the floor as soon as it happens. Remove soap buildup in the tub or shower  regularly. Attach bath mats securely with double-sided non-slip rug tape. Do not have throw rugs and other things on the floor that can make you trip. What can I do in the bedroom? Use night lights. Make sure that you have a light by your bed that is easy to reach. Do not use any sheets or blankets that are too big for your bed. They should not hang down onto the floor. Have a firm chair that has side arms. You can use this for support while you get dressed. Do not have throw rugs and other things on the floor that can make you trip. What can I do in the kitchen? Clean up any spills right away. Avoid walking on wet floors. Keep items that you use a lot in easy-to-reach places. If you need to reach something above you, use a strong step stool that has a grab bar. Keep electrical cords out of the way. Do not use floor polish or wax that makes floors slippery. If you must use wax, use non-skid floor wax. Do not have throw rugs and other things on the floor that can make you trip. What can I do with my stairs? Do not leave any items on the stairs. Make sure that there are handrails on both sides of the stairs and use them. Fix handrails that are broken or loose. Make sure that handrails are as long as the stairways.  Check any carpeting to make sure that it is firmly attached to the stairs. Fix any carpet that is loose or worn. Avoid having throw rugs at the top or bottom of the stairs. If you do have throw rugs, attach them to the floor with carpet tape. Make sure that you have a light switch at the top of the stairs and the bottom of the stairs. If you do not have them, ask someone to add them for you. What else can I do to help prevent falls? Wear shoes that: Do not have high heels. Have rubber bottoms. Are comfortable and fit you well. Are closed at the toe. Do not wear sandals. If you use a stepladder: Make sure that it is fully opened. Do not climb a closed stepladder. Make sure that  both sides of the stepladder are locked into place. Ask someone to hold it for you, if possible. Clearly mark and make sure that you can see: Any grab bars or handrails. First and last steps. Where the edge of each step is. Use tools that help you move around (mobility aids) if they are needed. These include: Canes. Walkers. Scooters. Crutches. Turn on the lights when you go into a dark area. Replace any light bulbs as soon as they burn out. Set up your furniture so you have a clear path. Avoid moving your furniture around. If any of your floors are uneven, fix them. If there are any pets around you, be aware of where they are. Review your medicines with your doctor. Some medicines can make you feel dizzy. This can increase your chance of falling. Ask your doctor what other things that you can do to help prevent falls. This information is not intended to replace advice given to you by your health care provider. Make sure you discuss any questions you have with your health care provider. Document Released: 08/19/2009 Document Revised: 03/30/2016 Document Reviewed: 11/27/2014 Elsevier Interactive Patient Education  2017 [employer].

## 2021-08-08 NOTE — Progress Notes (Addendum)
Virtual Visit via Telephone Note  I connected with  Gordon Phillips on 08/08/21 at 11:45 AM EDT by telephone and verified that I am speaking with the correct person using two identifiers.  Medicare Annual Wellness visit completed telephonically due to Covid-19 pandemic.   Persons participating in this call: This Health Coach and this patient.   Location: Patient: Home Provider: Office   I discussed the limitations, risks, security and privacy concerns of performing an evaluation and management service by telephone and the availability of in person appointments. The patient expressed understanding and agreed to proceed.  Unable to perform video visit due to video visit attempted and failed and/or patient does not have video capability.   Some vital signs may be absent or patient reported.   Willette Brace, LPN   Subjective:   Gordon Phillips is a 75 y.o. male who presents for Medicare Annual/Subsequent preventive examination.  Review of Systems     Cardiac Risk Factors include: advanced age (>76men, >1 women);hypertension;dyslipidemia;male gender;obesity (BMI >30kg/m2)     Objective:    Today's Vitals   08/08/21 1149  PainSc: 7    There is no height or weight on file to calculate BMI.  Advanced Directives 08/08/2021 01/04/2021 12/21/2020 07/29/2020 09/28/2019 09/26/2019 08/13/2019  Does Patient Have a Medical Advance Directive? No No No No No No No  Does patient want to make changes to medical advance directive? - - - Yes (MAU/Ambulatory/Procedural Areas - Information given) - - -  Would patient like information on creating a medical advance directive? Yes (MAU/Ambulatory/Procedural Areas - Information given) No - Patient declined No - Patient declined - - No - Patient declined No - Patient declined  Pre-existing out of facility DNR order (yellow form or pink MOST form) - - - - - - -    Current Medications (verified) Outpatient Encounter Medications as of 08/08/2021  Medication Sig    acetaminophen (TYLENOL) 650 MG CR tablet Take 1,300 mg by mouth daily as needed for pain.   Aloe-Sodium Chloride (AYR SALINE NASAL GEL NA) Place 1 application into the nose at bedtime as needed (congestion).   diltiazem (CARDIZEM CD) 120 MG 24 hr capsule TAKE 1 CAPSULE BY MOUTH EVERY DAY   doxazosin (CARDURA) 2 MG tablet TAKE 1 TABLET (2 MG TOTAL) BY MOUTH AT BEDTIME.   glucose blood (ONETOUCH VERIO) test strip Use as instructed to check blood sugar once daily   hydrocortisone cream 1 % Apply 1 application topically 2 (two) times daily as needed for itching.   Lancets (ONETOUCH ULTRASOFT) lancets Use as instructed to check blood sugar once daily   losartan (COZAAR) 50 MG tablet TAKE 1 TABLET BY MOUTH EVERY DAY   metoprolol succinate (TOPROL-XL) 100 MG 24 hr tablet TAKE 1 TABLET (100 MG TOTAL) BY MOUTH AT BEDTIME. TAKE WITH OR IMMEDIATELY FOLLOWING A MEAL.   Misc Natural Products (PROSTATE THERAPY COMPLEX PO) Take 1 capsule by mouth in the morning. TruNature Prostate Plus Health Complex   OVER THE COUNTER MEDICATION Take 1 capsule by mouth at bedtime. OTC - Super Beta Prostate   pravastatin (PRAVACHOL) 40 MG tablet TAKE 1 TABLET BY MOUTH EVERYDAY AT BEDTIME   PRESCRIPTION MEDICATION Inhale into the lungs at bedtime. CPAP   sildenafil (VIAGRA) 100 MG tablet Take 100 mg by mouth daily as needed for erectile dysfunction.    sodium chloride (OCEAN) 0.65 % nasal spray Place 1 spray into the nose as needed for congestion.   vitamin C (ASCORBIC ACID) 500 MG  tablet Take 500 mg by mouth 3 (three) times a week.   [DISCONTINUED] finasteride (PROSCAR) 5 MG tablet Take 5 mg by mouth in the morning.   apixaban (ELIQUIS) 5 MG TABS tablet Take 1 tablet (5 mg total) by mouth 2 (two) times daily.   No facility-administered encounter medications on file as of 08/08/2021.    Allergies (verified) Flomax [tamsulosin hcl], Relpax [eletriptan], and Other   History: Past Medical History:  Diagnosis Date    ALLERGIC RHINITIS 01/25/2010   Arthritis    "neck, back, shoulders," (02/20/2017)   Chronic lower back pain    COVID 06/17/2021   has lingering cough that subsding   High cholesterol    Hx of cardiovascular stress test    a. [place] 02/06/13:  EF 59%, no ischemia or scar   Hx of echocardiogram    a.  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   Vitiligo    Past Surgical History:  Procedure Laterality Date   A-FLUTTER ABLATION N/A 04/23/2019   Procedure: A-FLUTTER ABLATION;  Surgeon: Deboraha Sprang, MD;  Location: [place] [facility];  Service: Cardiovascular;  Laterality: N/A;   ATRIAL FIBRILLATION ABLATION N/A 03/30/2021   Procedure: ATRIAL FIBRILLATION ABLATION;  Surgeon: Constance Haw, MD;  Location: [place] [facility];  Service: Cardiovascular;  Laterality: N/A;   BIOPSY  09/29/2019   Procedure: BIOPSY;  Surgeon: Thornton Park, MD;  Location: [place];  Service: Gastroenterology;;   CARDIOVERSION N/A 08/14/2018   Procedure: [id number];  Surgeon: [name], MD;  Location: [facility] ENDOSCOPY;  Service: Cardiovascular;  Laterality: N/A;   CARDIOVERSION N/A 01/04/2021   Procedure: CARDIOVERSION;  Surgeon: Pixie Casino, MD;  Location: [facility] ENDOSCOPY;  Service: Cardiovascular;  Laterality: N/A;   ESOPHAGOGASTRODUODENOSCOPY N/A 09/29/2019   Procedure: ESOPHAGOGASTRODUODENOSCOPY (EGD);  Surgeon: Thornton Park, MD;  Location: [place];  Service: Gastroenterology;  Laterality: N/A;   INGUINAL HERNIA REPAIR Bilateral    LUMBAR LAMINECTOMY     TEE WITHOUT  CARDIOVERSION N/A 08/14/2018   Procedure: TRANSESOPHAGEAL ECHOCARDIOGRAM (TEE);  Surgeon: [name], MD;  Location: [facility] ENDOSCOPY;  Service: Cardiovascular;  Laterality: N/A;   TEE WITHOUT CARDIOVERSION N/A 03/30/2021   Procedure: TRANSESOPHAGEAL ECHOCARDIOGRAM (TEE);  Surgeon: Skeet Latch, MD;  Location: [facility] ENDOSCOPY;  Service: Cardiovascular;  Laterality: N/A;   UMBILICAL HERNIA REPAIR     Family History  Problem Relation Age of Onset   Stroke Father        Passed away in his 65s   Thyroid disease Sister        goiter   Sudden death Brother        ? Drug use, no autopsy   Throat cancer Brother    Colon cancer Neg Hx    Esophageal cancer Neg Hx    Rectal cancer Neg Hx    Stomach cancer Neg Hx    Social History   Socioeconomic History   Marital status: Married    Spouse name: Not on file   Number of  children: 2   Years of education: Not on file   Highest education level: Not on file  Occupational History   Occupation: sales   Occupation: retired  Tobacco Use   Smoking status: Former    Packs/day: 0.50    Years: 8.00    Pack years: 4.00    Types: Cigarettes    Quit date: 1968    Years since quitting: 54.7   Smokeless tobacco: Never  Vaping Use   Vaping Use: Never used  Substance and Sexual Activity   Alcohol use: Yes    Alcohol/week: 1.0 - 2.0 standard drink    Types: 1 - 2 Shots of liquor per week    Comment: 02/20/2017 "couple drinks/month on average"   Drug use: No   Sexual activity: Yes  Other Topics Concern   Not on file  Social History Narrative   Not on file   Social Determinants of Health   Financial Resource Strain: Low Risk    Difficulty of Paying Living Expenses: Not hard at all  Food Insecurity: No Food Insecurity   Worried About Charity fundraiser in the Last Year: Never true   [place] in the Last Year: Never true  Transportation Needs: No Transportation Needs   Lack of Transportation (Medical): No   Lack of Transportation  (Non-Medical): No  Physical Activity: Inactive   Days of Exercise per Week: 0 days   Minutes of Exercise per Session: 0 min  Stress: No Stress Concern Present   Feeling of Stress : Not at all  Social Connections: Moderately Isolated   Frequency of Communication with Friends and Family: More than three times a week   Frequency of Social Gatherings with Friends and Family: More than three times a week   Attends Religious Services: Never   Marine scientist or Organizations: No   Attends Music therapist: Never   Marital Status: Married    Tobacco Counseling Counseling given: Not Answered   Clinical Intake:  Pre-visit preparation completed: Yes  Pain : 0-10 (arthritic pain in back) Pain Score: 7  Pain Type: Chronic pain Pain Location: Back Pain Descriptors / Indicators: Aching Pain Onset: More than a month ago Pain Frequency: Intermittent     BMI - recorded: 35.36 Nutritional Status: BMI > 30  Obese Nutritional Risks: None Diabetes: No  How often do you need to have someone help you when you read instructions, pamphlets, or other written materials from your doctor or pharmacy?: 1 - Never  Diabetic?No  Interpreter Needed?: No  Information entered by :: Charlott Rakes, LPN   Activities of Daily Living In your present state of health, do you have any difficulty performing the following activities: 08/08/2021  Hearing? N  Vision? N  Difficulty concentrating or making decisions? N  Walking or climbing stairs? N  Dressing or bathing? N  Doing errands, shopping? N  Preparing Food and eating ? N  Using the Toilet? N  In the past six months, have you accidently leaked urine? N  Do you have problems with loss of bowel control? N  Managing your Medications? N  Managing your Finances? N  Housekeeping or managing your Housekeeping? N  Some recent data might be hidden    Patient Care Team: Vivi Barrack, MD as PCP - General (Family  Medicine) Buford Dresser, MD as PCP - Cardiology (Cardiology) Truitt Merle, MD as Consulting Physician (Hematology) [place], P.A. as Consulting Physician (Ophthalmology)  Indicate any recent  Medical Services you may have received from other than [facility] providers in the past year (date may be approximate).     Assessment:   This is a routine wellness examination for [facility].  Hearing/Vision screen Hearing Screening - Comments:: Pt denies any hearing issues  Vision Screening - Comments:: Pt follows up with Dr Katy Fitch for annual eye exams   Dietary issues and exercise activities discussed: Current Exercise Habits: The patient has a physically strenuous job, but has no regular exercise apart from work.   Goals Addressed             This Visit's Progress    Patient Stated       Lose weight        Depression Screen PHQ 2/9 Scores 08/08/2021 07/29/2020 08/26/2019 08/20/2018 03/20/2016 08/10/2014  PHQ - 2 Score 0 0 0 0 0 0  PHQ- 9 Score - - 3 - - -    Fall Risk Fall Risk  08/08/2021 07/29/2020 08/26/2019 07/03/2019 08/20/2018  Falls in the past year? 0 0 0 0 No  Number falls in past yr: 0 0 - 0 -  Injury with Fall? 0 0 - 0 -  Risk for fall due to : Impaired vision Impaired vision - - -  Follow up Falls prevention discussed Falls prevention discussed - - -    FALL RISK PREVENTION PERTAINING TO THE HOME:  Any stairs in or around the home? Yes  If so, are there any without handrails? No  Home free of loose throw rugs in walkways, pet beds, electrical cords, etc? Yes  Adequate lighting in your home to reduce risk of falls? Yes   ASSISTIVE DEVICES UTILIZED TO PREVENT FALLS:  Life alert? No  Use of a cane, walker or w/c? No  Grab bars in the bathroom? Yes  Shower chair or bench in shower? No  Elevated toilet seat or a handicapped toilet? No   TIMED UP AND GO:  Was the test performed? No .   Cognitive Function:     6CIT Screen 08/08/2021 07/29/2020 07/03/2019   What Year? 0 points 0 points 0 points  What month? 0 points 0 points 0 points  What time? 0 points - 0 points  Count back from 20 0 points 0 points 0 points  Months in reverse 0 points 0 points 0 points  Repeat phrase 0 points 2 points 0 points  Total Score 0 - 0    Immunizations Immunization History  Administered Date(s) Administered   Fluad Quad(high Dose 65+) 08/26/2019, 09/07/2020   Influenza Split 08/10/2011, 08/21/2012   Influenza, High Dose Seasonal PF 08/24/2017   Influenza,inj,Quad PF,6+ Mos 08/07/2013, 08/10/2014, 08/12/2018   Influenza-Unspecified 06/20/2016   PFIZER(Purple Top)SARS-COV-2 Vaccination 12/18/2019, 01/12/2020, 10/14/2020   Pneumococcal Conjugate-13 08/10/2014   Pneumococcal Polysaccharide-23 03/20/2016   Tdap 12/14/2014    TDAP status: Up to date  Flu Vaccine status: Due, Education has been provided regarding the importance of this vaccine. Advised may receive this vaccine at local pharmacy or Health Dept. Aware to provide a copy of the vaccination record if obtained from local pharmacy or Health Dept. Verbalized acceptance and understanding.  Pneumococcal vaccine status: Up to date  Covid-19 vaccine status: Completed vaccines  Qualifies for Shingles Vaccine? Yes   Zostavax completed No   Shingrix Completed?: No.    Education has been provided regarding the importance of this vaccine. Patient has been advised to call insurance company to determine out of pocket expense if they have not yet received  this vaccine. Advised may also receive vaccine at local pharmacy or Health Dept. Verbalized acceptance and understanding.  Screening Tests Health Maintenance  Topic Date Due   Zoster Vaccines- Shingrix (1 of 2) Never done   COVID-19 Vaccine (4 - Booster for Pfizer series) 01/06/2021   INFLUENZA VACCINE  06/06/2021   COLONOSCOPY (Pts 45-63yrs Insurance coverage will need to be confirmed)  05/15/2023   TETANUS/TDAP  12/14/2024   Hepatitis C Screening   Completed   HPV VACCINES  Aged Out    Health Maintenance  Health Maintenance Due  Topic Date Due   Zoster Vaccines- Shingrix (1 of 2) Never done   COVID-19 Vaccine (4 - Booster for Pfizer series) 01/06/2021   INFLUENZA VACCINE  06/06/2021    Colorectal cancer screening: Type of screening: Colonoscopy. Completed 05/14/18. Repeat every 5 years  Additional Screening:  Hepatitis C Screening:  Completed 12/14/14  Vision Screening: Recommended annual ophthalmology exams for early detection of glaucoma and other disorders of the eye. Is the patient up to date with their annual eye exam?  Yes  Who is the provider or what is the name of the office in which the patient attends annual eye exams? Dr Katy Fitch If pt is not established with a provider, would they like to be referred to a provider to establish care? No .   Dental Screening: Recommended annual dental exams for proper oral hygiene  Community Resource Referral / Chronic Care Management: CRR required this visit?  No   CCM required this visit?  No      Plan:     I have personally reviewed and noted the following in the patient's chart:   Medical and social history Use of alcohol, tobacco or illicit drugs  Current medications and supplements including opioid prescriptions. Patient is not currently taking opioid prescriptions. Functional ability and status Nutritional status Physical activity Advanced directives List of other physicians Hospitalizations, surgeries, and ER visits in previous 12 months Vitals Screenings to include cognitive, depression, and falls Referrals and appointments  In addition, I have reviewed and discussed with patient certain preventive protocols, quality metrics, and best practice recommendations. A written personalized care plan for preventive services as well as general preventive health recommendations were provided to patient.     Willette Brace, LPN   73/12/2023   Nurse Notes: None

## 2021-08-09 ENCOUNTER — Inpatient Hospital Stay: Admission: RE | Admit: 2021-08-09 | Payer: Medicare HMO | Source: Ambulatory Visit

## 2021-08-16 ENCOUNTER — Ambulatory Visit (INDEPENDENT_AMBULATORY_CARE_PROVIDER_SITE_OTHER)
Admission: RE | Admit: 2021-08-16 | Discharge: 2021-08-16 | Disposition: A | Discharge status: Home / Self Care | Payer: Medicare HMO | Source: Ambulatory Visit | Attending: Internal Medicine | Admitting: Internal Medicine

## 2021-08-16 ENCOUNTER — Other Ambulatory Visit: Payer: Self-pay

## 2021-08-16 DIAGNOSIS — R1030 Lower abdominal pain, unspecified: Secondary | ICD-10-CM

## 2021-08-16 DIAGNOSIS — K5900 Constipation, unspecified: Secondary | ICD-10-CM

## 2021-08-16 MED ORDER — IOHEXOL 350 MG/ML SOLN
100.0000 mL | Freq: Once | INTRAVENOUS | Status: AC | PRN
  Administered 2021-08-16: 100 mL via INTRAVENOUS

## 2021-08-17 ENCOUNTER — Ambulatory Visit: Payer: Medicare HMO | Admitting: Hematology

## 2021-08-17 ENCOUNTER — Other Ambulatory Visit: Payer: Medicare HMO

## 2021-08-23 ENCOUNTER — Other Ambulatory Visit: Payer: Self-pay

## 2021-08-23 ENCOUNTER — Inpatient Hospital Stay: Payer: Medicare HMO | Admitting: Nurse Practitioner

## 2021-08-23 ENCOUNTER — Encounter: Payer: Self-pay | Admitting: Nurse Practitioner

## 2021-08-23 ENCOUNTER — Inpatient Hospital Stay: Payer: Medicare HMO | Attending: Nurse Practitioner

## 2021-08-23 VITALS — BP 150/73 | HR 70 | Temp 97.4°F | Resp 29 | Wt 277.5 lb

## 2021-08-23 DIAGNOSIS — M549 Dorsalgia, unspecified: Secondary | ICD-10-CM | POA: Diagnosis not present

## 2021-08-23 DIAGNOSIS — E7800 Pure hypercholesterolemia, unspecified: Secondary | ICD-10-CM | POA: Insufficient documentation

## 2021-08-23 DIAGNOSIS — G4733 Obstructive sleep apnea (adult) (pediatric): Secondary | ICD-10-CM | POA: Insufficient documentation

## 2021-08-23 DIAGNOSIS — E669 Obesity, unspecified: Secondary | ICD-10-CM | POA: Insufficient documentation

## 2021-08-23 DIAGNOSIS — D472 Monoclonal gammopathy: Secondary | ICD-10-CM | POA: Diagnosis present

## 2021-08-23 DIAGNOSIS — I10 Essential (primary) hypertension: Secondary | ICD-10-CM | POA: Insufficient documentation

## 2021-08-23 DIAGNOSIS — Z7901 Long term (current) use of anticoagulants: Secondary | ICD-10-CM | POA: Insufficient documentation

## 2021-08-23 DIAGNOSIS — Z8673 Personal history of transient ischemic attack (TIA), and cerebral infarction without residual deficits: Secondary | ICD-10-CM | POA: Diagnosis not present

## 2021-08-23 DIAGNOSIS — D696 Thrombocytopenia, unspecified: Secondary | ICD-10-CM | POA: Insufficient documentation

## 2021-08-23 LAB — CMP (CANCER CENTER ONLY)
ALT: 17 U/L (ref 0–44)
AST: 21 U/L (ref 15–41)
Albumin: 4 g/dL (ref 3.5–5.0)
Alkaline Phosphatase: 62 U/L (ref 38–126)
Anion gap: 10 (ref 5–15)
BUN: 16 mg/dL (ref 8–23)
CO2: 23 mmol/L (ref 22–32)
Calcium: 9.4 mg/dL (ref 8.9–10.3)
Chloride: 106 mmol/L (ref 98–111)
Creatinine: 1.08 mg/dL (ref 0.61–1.24)
GFR, Estimated: >60 mL/min (ref >60)
Glucose, Bld: 118 mg/dL — ABNORMAL HIGH (ref 70–99)
Potassium: 3.9 mmol/L (ref 3.5–5.1)
Sodium: 139 mmol/L (ref 135–145)
Total Bilirubin: 0.7 mg/dL (ref 0.3–1.2)
Total Protein: 7.7 g/dL (ref 6.5–8.1)

## 2021-08-23 LAB — CBC WITH DIFFERENTIAL (CANCER CENTER ONLY)
Abs Immature Granulocytes: 0.01 10*3/uL (ref 0.00–0.07)
Basophils Absolute: 0 10*3/uL (ref 0.0–0.1)
Basophils Relative: 0 %
Eosinophils Absolute: 0.1 10*3/uL (ref 0.0–0.5)
Eosinophils Relative: 2 %
HCT: 40.3 % (ref 39.0–52.0)
Hemoglobin: 13.4 g/dL (ref 13.0–17.0)
Immature Granulocytes: 0 %
Lymphocytes Relative: 36 %
Lymphs Abs: 2.3 10*3/uL (ref 0.7–4.0)
MCH: 30.3 pg (ref 26.0–34.0)
MCHC: 33.3 g/dL (ref 30.0–36.0)
MCV: 91.2 fL (ref 80.0–100.0)
Monocytes Absolute: 0.5 10*3/uL (ref 0.1–1.0)
Monocytes Relative: 8 %
Neutro Abs: 3.4 10*3/uL (ref 1.7–7.7)
Neutrophils Relative %: 54 %
Platelet Count: 145 10*3/uL — ABNORMAL LOW (ref 150–400)
RBC: 4.42 MIL/uL (ref 4.22–5.81)
RDW: 13.5 % (ref 11.5–15.5)
WBC Count: 6.3 10*3/uL (ref 4.0–10.5)
nRBC: 0 % (ref 0.0–0.2)

## 2021-08-23 NOTE — Progress Notes (Signed)
[facility]   [phone] [phone]   Clinic Follow up Note   Patient Care Team: Vivi Barrack, MD as PCP - General (Family Medicine) Buford Dresser, MD as PCP - Cardiology (Cardiology) Truitt Merle, MD as Consulting Physician (Hematology) [facility] as Consulting Physician (Ophthalmology) 08/23/2021  CHIEF COMPLAINT: Follow-up MGUS  CURRENT THERAPY: Observation  INTERVAL HISTORY: Mr. Scerbo returns for annual follow-up as scheduled.  He has no specific complaints except worsening back pain.  Denies obvious injury.  Some days are better than others, pain fluctuates.  Denies new pain.  He had a cardiac ablation for A. fib this year which he tolerated well. He is feeling better from this.  Denies recent infection, unintentional weight loss, bleeding, or other new concerns.   MEDICAL HISTORY:  Past Medical History:  Diagnosis Date   ALLERGIC RHINITIS 01/25/2010   Arthritis    "neck, back, shoulders," (02/20/2017)   Chronic lower back pain    COVID 06/17/2021   has lingering cough that subsding   High cholesterol    Hx of cardiovascular stress test    a. [place] 02/06/13:  EF 59%, no ischemia or scar   Hx of echocardiogram    a.  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   Vitiligo     SURGICAL HISTORY: Past Surgical History:  Procedure Laterality Date   A-FLUTTER ABLATION N/A 04/23/2019   Procedure: A-FLUTTER ABLATION;  Surgeon: Deboraha Sprang, MD;  Location: [place] [facility];  Service: Cardiovascular;   Laterality: N/A;   ATRIAL FIBRILLATION ABLATION N/A 03/30/2021   Procedure: ATRIAL FIBRILLATION ABLATION;  Surgeon: Constance Haw, MD;  Location: [place] [facility];  Service: Cardiovascular;  Laterality: N/A;   BIOPSY  09/29/2019   Procedure: BIOPSY;  Surgeon: Thornton Park, MD;  Location: [place];  Service: Gastroenterology;;   CARDIOVERSION N/A 08/14/2018   Procedure: [id number];  Surgeon: [name], MD;  Location: [facility] ENDOSCOPY;  Service: Cardiovascular;  Laterality: N/A;   CARDIOVERSION N/A 01/04/2021   Procedure: CARDIOVERSION;  Surgeon: Pixie Casino, MD;  [facility] ENDOSCOPY;  Service: Cardiovascular;  Laterality: N/A;   ESOPHAGOGASTRODUODENOSCOPY N/A 09/29/2019   Procedure: ESOPHAGOGASTRODUODENOSCOPY (EGD);  Surgeon: Thornton Park, MD;  Location: [place];  Service: Gastroenterology;  Laterality: N/A;   INGUINAL HERNIA REPAIR Bilateral    LUMBAR LAMINECTOMY     TEE WITHOUT CARDIOVERSION N/A 08/14/2018   Procedure: TRANSESOPHAGEAL ECHOCARDIOGRAM (TEE);  Surgeon: [name], MD;  Location: [facility] ENDOSCOPY;  Service: Cardiovascular;  Laterality: N/A;   TEE WITHOUT CARDIOVERSION N/A 03/30/2021   Procedure: TRANSESOPHAGEAL ECHOCARDIOGRAM (TEE);  Surgeon: Skeet Latch, MD;  Location: [place];  Service: Cardiovascular;  Laterality: N/A;   UMBILICAL HERNIA REPAIR      I have reviewed the social history and family history with the patient and they are unchanged from previous note.  ALLERGIES:  is allergic to flomax [tamsulosin hcl], relpax [eletriptan], and other.  MEDICATIONS:  Current Outpatient Medications  Medication Sig Dispense Refill  acetaminophen (TYLENOL) 650 MG CR tablet Take 1,300 mg by mouth daily as needed for pain.     Aloe-Sodium Chloride (AYR SALINE NASAL GEL NA) Place 1 application into the nose at bedtime as needed (congestion).     apixaban (ELIQUIS) 5 MG TABS tablet Take 1 tablet (5 mg total) by mouth 2 (two) times daily.  180 tablet 2   diltiazem (CARDIZEM CD) 120 MG 24 hr capsule TAKE 1 CAPSULE BY MOUTH EVERY DAY 90 capsule 3   doxazosin (CARDURA) 2 MG tablet TAKE 1 TABLET (2 MG TOTAL) BY MOUTH AT BEDTIME. 90 tablet 2   finasteride (PROSCAR) 5 MG tablet Take 5 mg by mouth daily.     glucose blood (ONETOUCH VERIO) test strip Use as instructed to check blood sugar once daily 100 each 3   hydrocortisone cream 1 % Apply 1 application topically 2 (two) times daily as needed for itching.     Lancets (ONETOUCH ULTRASOFT) lancets Use as instructed to check blood sugar once daily 100 each 3   losartan (COZAAR) 50 MG tablet TAKE 1 TABLET BY MOUTH EVERY DAY 90 tablet 1   metoprolol succinate (TOPROL-XL) 100 MG 24 hr tablet TAKE 1 TABLET (100 MG TOTAL) BY MOUTH AT BEDTIME. TAKE WITH OR IMMEDIATELY FOLLOWING A MEAL. 90 tablet 3   Misc Natural Products (PROSTATE THERAPY COMPLEX PO) Take 1 capsule by mouth in the morning. TruNature Prostate Plus Health Complex     OVER THE COUNTER MEDICATION Take 1 capsule by mouth at bedtime. OTC - Super Beta Prostate     pravastatin (PRAVACHOL) 40 MG tablet TAKE 1 TABLET BY MOUTH EVERYDAY AT BEDTIME 90 tablet 3   PRESCRIPTION MEDICATION Inhale into the lungs at bedtime. CPAP     sildenafil (VIAGRA) 100 MG tablet Take 100 mg by mouth daily as needed for erectile dysfunction.      sodium chloride (OCEAN) 0.65 % nasal spray Place 1 spray into the nose as needed for congestion.     vitamin C (ASCORBIC ACID) 500 MG tablet Take 500 mg by mouth 3 (three) times a week.     No current facility-administered medications for this visit.    PHYSICAL EXAMINATION:  Vitals:   08/23/21 1103  BP: (!) 150/73  Pulse: 70  Resp: (!) 29  Temp: (!) 97.4 F (36.3 C)  SpO2: 98%  Repeat respiratory rate during exam: 18  Filed Weights   08/23/21 1103  Weight: 277 lb 8 oz (125.9 kg)    GENERAL:alert, no distress and comfortable SKIN: Vitiligo EYES: sclera clear NECK: Without Mass LYMPH:  no palpable  cervical or supraclavicular lymphadenopathy  LUNGS: clear with normal breathing effort HEART: regular rate & rhythm, no lower extremity edema ABDOMEN:abdomen soft, non-tender and normal bowel sounds Musculoskeletal: No focal spinal tenderness NEURO: alert & oriented x 3 with fluent speech, no focal motor deficits  LABORATORY DATA:  I have reviewed the data as listed CBC Latest Ref Rng & Units 08/23/2021 03/28/2021 03/21/2021  WBC 4.0 - 10.5 K/uL 6.3 5.4 CANCELED  Hemoglobin 13.0 - 17.0 g/dL 13.4 13.2 CANCELED  Hematocrit 39.0 - 52.0 % 40.3 39.6 CANCELED  Platelets 150 - 400 K/uL 145(L) 143(L) CANCELED     CMP Latest Ref Rng & Units 08/23/2021 08/01/2021 03/21/2021  Glucose 70 - 99 mg/dL 118(H) 86 93  BUN 8 - 23 mg/dL _0 Creatinine 0.61 - 1.24 mg/dL 1.08 0.96 1.04  Sodium 135 - 145 mmol/L 139 140 138  Potassium 3.5 -  5.1 mmol/L 3.9 3.8 3.8  Chloride 98 - 111 mmol/L 106 104 102  CO2 22 - 32 mmol/L _0 Calcium 8.9 - 10.3 mg/dL 9.4 9.5 9.0  Total Protein 6.5 - 8.1 g/dL 7.7 - -  Total Bilirubin 0.3 - 1.2 mg/dL 0.7 - -  Alkaline Phos 38 - 126 U/L 62 - -  AST 15 - 41 U/L 21 - -  ALT 0 - 44 U/L 17 - -      RADIOGRAPHIC STUDIES: I have personally reviewed the radiological images as listed and agreed with the findings in the report. No results found.   ASSESSMENT & PLAN:  75 y.o. African-American male, with past medical history of Graves' disease, vitiligo, hypertension, dyslipidemia, was incidentally found a small hypodense lesion in C5 on CT scan. Lab work reviewed a monoclonal paraprotein.   1. MGUS -His initial SPEP reveal a low-level of monoclonal paraprotein (0.27g/dl), immunoglobulin level were normal, no anemia, renal dysfunction, or hypercalcemia -His previous CT scan revealed a small hyper lytic lesion in the C5 vertebral body however was not obvious on the MRI scan. -A bone survey showed subtle lucencies in multiple bones, indeterminate.  -Bone marrow biopsy  showed 5% plasma cells, suggestive of plasma cell dyscrasia. Cytogenetics was normal. -M protein has remained stable, level is pending from today, light chains normal   3. Mild thrombocytopenia -The platelet count has been in the range of 120 to 153 since 2016 -He does have other autoimmune disease including Graves' disease and vitiligo.  - His bone marrow biopsy showed abundant megakaryocytes, some of which display atypical features, the significance is uncertain. -stable   4. Chronic back pain  -His lumbar MRI in March 2016 showed severe degenerative changes, which is likely the cause of his back pain. -His back pain worsened in 2020, images 08/13/19 shows moderate to severe neural foraminal stenosis -Continues to worsen, will repeat bone survey   5. Hypertension, dyslipidemia -f/u PCP   6. Cancer screening  -per PCP -colonoscopy done 05/2018  -PSA normal on 03/26/19     Disposition: Mr. Carino appears stable.  He has worsening back pain in the setting of known degenerative change and neural foraminal stenosis.  Bone survey from 2017 showed multiple lucencies.  Today's physical exam and labs are stable. Myeloma panel is pending, M protein was stable 6 months ago.  I have a low clinical suspicion his worsening bone pain is related to MGUS/MM, however I will refer him for bone survey to rule out.  I will call him with the results of the myeloma panel labs and bone survey. If work-up is negative, continue observation with lab in 6 and 12 months and annual follow-up.  Orders Placed This Encounter  Procedures   DG Bone Survey Met    Standing Status:   Future    Standing Expiration Date:   08/23/2022    Order Specific Question:   Reason for Exam (SYMPTOM  OR DIAGNOSIS REQUIRED)    Answer:   MGUS, worsening back pain, compare to 2017    Order Specific Question:   Preferred imaging location?    Answer:   [facility]    All questions were answered. The patient knows to call the  clinic with any problems, questions or concerns. No barriers to learning were detected.     Alla Feeling, NP 08/23/21

## 2021-08-24 LAB — KAPPA/LAMBDA LIGHT CHAINS
Kappa free light chain: 17.9 mg/L (ref 3.3–19.4)
Kappa, lambda light chain ratio: 2.01 — ABNORMAL HIGH (ref 0.26–1.65)
Lambda free light chains: 8.9 mg/L (ref 5.7–26.3)

## 2021-08-25 LAB — MULTIPLE MYELOMA PANEL, SERUM
Albumin SerPl Elph-Mcnc: 3.7 g/dL (ref 2.9–4.4)
Albumin/Glob SerPl: 1.1 (ref 0.7–1.7)
Alpha 1: 0.2 g/dL (ref 0.0–0.4)
Alpha2 Glob SerPl Elph-Mcnc: 1 g/dL (ref 0.4–1.0)
B-Globulin SerPl Elph-Mcnc: 1.1 g/dL (ref 0.7–1.3)
Gamma Glob SerPl Elph-Mcnc: 1.4 g/dL (ref 0.4–1.8)
Globulin, Total: 3.7 g/dL (ref 2.2–3.9)
IgA: 134 mg/dL (ref 61–437)
IgG (Immunoglobin G), Serum: 1428 mg/dL (ref 603–1613)
IgM (Immunoglobulin M), Srm: 77 mg/dL (ref 15–143)
M Protein SerPl Elph-Mcnc: 0.5 g/dL — ABNORMAL HIGH
Total Protein ELP: 7.4 g/dL (ref 6.0–8.5)

## 2021-08-27 ENCOUNTER — Other Ambulatory Visit: Payer: Self-pay | Admitting: Family Medicine

## 2021-08-31 ENCOUNTER — Telehealth: Payer: Self-pay

## 2021-08-31 NOTE — Telephone Encounter (Signed)
Called Mr. Uptain and discussed his lab results with him.  He was appreciative and had no concerns. Gardiner Rhyme

## 2021-08-31 NOTE — Telephone Encounter (Signed)
Error. Sorcha Rotunno M Imanii Gosdin, RN  

## 2021-08-31 NOTE — Telephone Encounter (Signed)
-----   Message from Alla Feeling, NP sent at 08/30/2021  1:04 PM EDT ----- Please let him know M protein is stable, light chain ratio is slightly higher than 02/2021 but better than last year. No concerns. Continue observation. Will call him after bone survey.   Thanks, Regan Rakers, NP

## 2021-09-13 ENCOUNTER — Encounter: Payer: Self-pay | Admitting: Family Medicine

## 2021-09-13 ENCOUNTER — Ambulatory Visit (INDEPENDENT_AMBULATORY_CARE_PROVIDER_SITE_OTHER): Payer: Medicare HMO | Admitting: Family Medicine

## 2021-09-13 ENCOUNTER — Other Ambulatory Visit: Payer: Self-pay

## 2021-09-13 VITALS — BP 119/68 | HR 83 | Temp 97.9°F | Ht 74.0 in | Wt 273.4 lb

## 2021-09-13 DIAGNOSIS — N401 Enlarged prostate with lower urinary tract symptoms: Secondary | ICD-10-CM

## 2021-09-13 DIAGNOSIS — I10 Essential (primary) hypertension: Secondary | ICD-10-CM

## 2021-09-13 DIAGNOSIS — D472 Monoclonal gammopathy: Secondary | ICD-10-CM

## 2021-09-13 DIAGNOSIS — R7303 Prediabetes: Secondary | ICD-10-CM

## 2021-09-13 DIAGNOSIS — M549 Dorsalgia, unspecified: Secondary | ICD-10-CM

## 2021-09-13 DIAGNOSIS — I5030 Unspecified diastolic (congestive) heart failure: Secondary | ICD-10-CM | POA: Diagnosis not present

## 2021-09-13 DIAGNOSIS — Z23 Encounter for immunization: Secondary | ICD-10-CM | POA: Diagnosis not present

## 2021-09-13 DIAGNOSIS — Z0001 Encounter for general adult medical examination with abnormal findings: Secondary | ICD-10-CM | POA: Diagnosis not present

## 2021-09-13 DIAGNOSIS — I4819 Other persistent atrial fibrillation: Secondary | ICD-10-CM | POA: Diagnosis not present

## 2021-09-13 DIAGNOSIS — G8929 Other chronic pain: Secondary | ICD-10-CM

## 2021-09-13 NOTE — Assessment & Plan Note (Signed)
Continue management per cardiology. 

## 2021-09-13 NOTE — Progress Notes (Signed)
Chief Complaint:  Gordon Phillips is a 75 y.o. male who presents today for his annual comprehensive physical exam.    Assessment/Plan:  Chronic Problems Addressed Today: Back pain, chronic Follows with orthopedics.  No recent flares.  No red flags today.  Morbid obesity ([place]) Continue lifestyle modifications.  MGUS (monoclonal gammopathy of unknown significance) Continue management per oncology.  Persistent atrial fibrillation (H[facility]) Doing well status post ablation.  Regular rate and rhythm today.  Continue anticoagulation and rate control per cardiology.  (HFpEF) heart failure with preserved ejection fraction ([place]) Continue management per cardiology.  Prediabetes   Follows with endocrinology.  May benefit from GLP-1 agonist such as Ozempic in the future.  BPH (benign prostatic hyperplasia) Continue management per urology he has been cardioverted 2 mg daily and finasteride 5 mg daily.  Essential hypertension At goal on losartan 50 mg daily, diltiazem 120 mg daily and metoprolol 100 mg daily.   Body mass index is 35.1 kg/m. / Obese    Preventative Healthcare: Will get flu vaccine today. UTD on vaccine and screenings.   Patient Counseling(The following topics were reviewed and/or handout was given):  -Nutrition: Stressed importance of moderation in sodium/caffeine intake, saturated fat and cholesterol, caloric balance, sufficient intake of fresh fruits, vegetables, and fiber.  -Stressed the importance of regular exercise.   -Substance Abuse: Discussed cessation/primary prevention of tobacco, alcohol, or other drug use; driving or other dangerous activities under the influence; availability of treatment for abuse.   -Injury prevention: Discussed safety belts, safety helmets, smoke detector, smoking near bedding or upholstery.   -Sexuality: Discussed sexually transmitted diseases, partner selection, use of condoms, avoidance of unintended pregnancy and contraceptive  alternatives.   -Dental health: Discussed importance of regular tooth brushing, flossing, and dental visits.  -Health maintenance and immunizations reviewed. Please refer to Health maintenance section.  Return to care in 1 year for next preventative visit.     Subjective:  HPI:  He has no acute complaints today.  See a/p for status of chronic conditions.  Lifestyle Diet: Balanced. Cutting sugary drinks.  Exercise: Walking and moving around a lot.   Depression screen PHQ 2/9 09/13/2021  Decreased Interest 0  Down, Depressed, Hopeless 0  PHQ - 2 Score 0  Altered sleeping -  Tired, decreased energy -  Change in appetite -  Feeling bad or failure about yourself  -  Trouble concentrating -  Moving slowly or fidgety/restless -  Suicidal thoughts -  PHQ-9 Score -  Difficult doing work/chores -  Some recent data might be hidden    Health Maintenance Due  Topic Date Due   COVID-19 Vaccine (4 - Booster for Pfizer series) 12/09/2020   INFLUENZA VACCINE  06/06/2021     ROS: Per HPI, otherwise a complete review of systems was negative.   PMH:  The following were reviewed and entered/updated in epic: Past Medical History:  Diagnosis Date   ALLERGIC RHINITIS 01/25/2010   Arthritis    "neck, back, shoulders," (02/20/2017)   Chronic lower back pain    COVID 06/17/2021   has lingering cough that subsding   High cholesterol    Hx of cardiovascular stress test    a. [place] 02/06/13:  EF 59%, no ischemia or scar   Hx of echocardiogram    a.  [place] 02/03/13:  Mild LVH, mild focal basal septal hypertrophy, EF 50-55%, Gr 1 DD, mild LAE   HYPERTENSION 01/25/2010   HYPERTHYROIDISM 01/25/2010   S/P radioactive iodine "back in the 1980s" (  02/20/2017)   Impaired glucose tolerance    MGUS (monoclonal gammopathy of unknown significance)    "an autoimmune thing"   Obesity    OSA on CPAP    Uses CPAP nightly   Pericarditis 1980's   Stroke ([place])    Old L basal ganglia infarct by  CT 01/2013.   Vitiligo    Patient Active Problem List   Diagnosis Date Noted   Persistent atrial fibrillation ([place]) 12/28/2020   Secondary hypercoagulable state ([place]) 12/28/2020   (HFpEF) heart failure with preserved ejection fraction ([place]) 09/08/2020   Stroke of unknown cause ([place]) 09/08/2020   Facial pain 10/09/2019   Dysphagia 09/29/2019   Prediabetes 09/19/2019   H/O Graves' disease 09/19/2019   Deviated septum 08/20/2018   BPH (benign prostatic hyperplasia) 08/20/2018   Atrial flutter ([place]) 08/12/2018   HLD (hyperlipidemia) 02/20/2017   History of CVA (cerebrovascular accident) 02/20/2017   Back pain, chronic 11/22/2015   Morbid obesity ([place]) 05/31/2015   MGUS (monoclonal gammopathy of unknown significance) 01/21/2015   OSA (obstructive sleep apnea) 03/16/2014   Essential hypertension 01/25/2010   Allergic rhinitis 01/25/2010   Past Surgical History:  Procedure Laterality Date   A-FLUTTER ABLATION N/A 04/23/2019   Procedure: A-FLUTTER ABLATION;  Surgeon: Deboraha Sprang, MD;  Location: [place] [facility];  Service: Cardiovascular;  Laterality: N/A;   ATRIAL FIBRILLATION ABLATION N/A 03/30/2021   Procedure: ATRIAL FIBRILLATION ABLATION;  Surgeon: Constance Haw, MD;  Location: [place] [facility];  Service: Cardiovascular;  Laterality: N/A;   BIOPSY  09/29/2019   Procedure: BIOPSY;  Surgeon: Thornton Park, MD;  Location: [place];  Service: Gastroenterology;;   CARDIOVERSION N/A 08/14/2018   Procedure: [id number];  Surgeon: [name], MD;  Location: [facility] ENDOSCOPY;  Service: Cardiovascular;  Laterality: N/A;   CARDIOVERSION N/A 01/04/2021   Procedure: CARDIOVERSION;  Surgeon: Pixie Casino, MD;  Location: [facility] ENDOSCOPY;  Service: Cardiovascular;  Laterality: N/A;   ESOPHAGOGASTRODUODENOSCOPY N/A 09/29/2019   Procedure: ESOPHAGOGASTRODUODENOSCOPY (EGD);  Surgeon: Thornton Park, MD;  Location: [place];  Service: Gastroenterology;  Laterality: N/A;    INGUINAL HERNIA REPAIR Bilateral    LUMBAR LAMINECTOMY     TEE WITHOUT CARDIOVERSION N/A 08/14/2018   Procedure: TRANSESOPHAGEAL ECHOCARDIOGRAM (TEE);  Surgeon: [name], MD;  Location: [facility] ENDOSCOPY;  Service: Cardiovascular;  Laterality: N/A;   TEE WITHOUT CARDIOVERSION N/A 03/30/2021   Procedure: TRANSESOPHAGEAL ECHOCARDIOGRAM (TEE);  Surgeon: Skeet Latch, MD;  Location: [facility] ENDOSCOPY;  Service: Cardiovascular;  Laterality: N/A;   UMBILICAL HERNIA REPAIR      Family History  Problem Relation Age of Onset   Stroke Father        Passed away in his 23s   Thyroid disease Sister        goiter   Sudden death Brother        ? Drug use, no autopsy   Throat cancer Brother    Colon cancer Neg Hx    Esophageal cancer Neg Hx    Rectal cancer Neg Hx    Stomach cancer Neg Hx     Medications- reviewed and updated Current Outpatient Medications  Medication Sig Dispense Refill   acetaminophen (TYLENOL) 650 MG CR tablet Take 1,300 mg by mouth daily as needed for pain.     Aloe-Sodium Chloride (AYR SALINE NASAL GEL NA) Place 1 application into the nose at bedtime as needed (congestion).     diltiazem (CARDIZEM CD) 120 MG 24 hr capsule TAKE 1 CAPSULE BY MOUTH EVERY DAY  90 capsule 3   doxazosin (CARDURA) 2 MG tablet TAKE 1 TABLET (2 MG TOTAL) BY MOUTH AT BEDTIME. 90 tablet 2   finasteride (PROSCAR) 5 MG tablet Take 5 mg by mouth daily.     glucose blood (ONETOUCH VERIO) test strip Use as instructed to check blood sugar once daily 100 each 3   hydrocortisone cream 1 % Apply 1 application topically 2 (two) times daily as needed for itching.     Lancets (ONETOUCH ULTRASOFT) lancets Use as instructed to check blood sugar once daily 100 each 3   losartan (COZAAR) 50 MG tablet TAKE 1 TABLET BY MOUTH EVERY DAY 90 tablet 1   metoprolol succinate (TOPROL-XL) 100 MG 24 hr tablet TAKE 1 TABLET (100 MG TOTAL) BY MOUTH AT BEDTIME. TAKE WITH OR IMMEDIATELY FOLLOWING A MEAL. 90 tablet 3   Misc Natural  Products (PROSTATE THERAPY COMPLEX PO) Take 1 capsule by mouth in the morning. TruNature Prostate Plus Health Complex     OVER THE COUNTER MEDICATION Take 1 capsule by mouth at bedtime. OTC - Super Beta Prostate     pravastatin (PRAVACHOL) 40 MG tablet TAKE 1 TABLET BY MOUTH EVERYDAY AT BEDTIME 90 tablet 3   PRESCRIPTION MEDICATION Inhale into the lungs at bedtime. CPAP     sildenafil (VIAGRA) 100 MG tablet Take 100 mg by mouth daily as needed for erectile dysfunction.      sodium chloride (OCEAN) 0.65 % nasal spray Place 1 spray into the nose as needed for congestion.     vitamin C (ASCORBIC ACID) 500 MG tablet Take 500 mg by mouth 3 (three) times a week.     apixaban (ELIQUIS) 5 MG TABS tablet Take 1 tablet (5 mg total) by mouth 2 (two) times daily. 180 tablet 2   No current facility-administered medications for this visit.    Allergies-reviewed and updated Allergies  Allergen Reactions   Flomax [Tamsulosin Hcl] Itching   Relpax [Eletriptan] Other (See Comments) and Hypertension    Chest pain and sweating   Other Palpitations    Any steroids cause heart palpitations  (tolerates low doses)    Social History   Socioeconomic History   Marital status: Married    Spouse name: Not on file   Number of children: 2   Years of education: Not on file   Highest education level: Not on file  Occupational History   Occupation: Press photographer   Occupation: retired  Tobacco Use   Smoking status: Former    Packs/day: 0.50    Years: 8.00    Pack years: 4.00    Types: Cigarettes    Quit date: 1968    Years since quitting: 54.8   Smokeless tobacco: Never  Vaping Use   Vaping Use: Never used  Substance and Sexual Activity   Alcohol use: Yes    Alcohol/week: 1.0 - 2.0 standard drink    Types: 1 - 2 Shots of liquor per week    Comment: 02/20/2017 "couple drinks/month on average"   Drug use: No   Sexual activity: Yes  Other Topics Concern   Not on file  Social History Narrative   Not on file    Social Determinants of Health   Financial Resource Strain: Low Risk    Difficulty of Paying Living Expenses: Not hard at all  Food Insecurity: No Food Insecurity   Worried About Charity fundraiser in the Last Year: Never true   [place] in the Last Year: Never true  Transportation Needs: No Data processing manager (Medical): No   Lack of Transportation (Non-Medical): No  Physical Activity: Inactive   Days of Exercise per Week: 0 days   Minutes of Exercise per Session: 0 min  Stress: No Stress Concern Present   Feeling of Stress : Not at all  Social Connections: Moderately Isolated   Frequency of Communication with Friends and Family: More than three times a week   Frequency of Social Gatherings with Friends and Family: More than three times a week   Attends Religious Services: Never   Marine scientist or Organizations: No   Attends Music therapist: Never   Marital Status: Married        Objective:  Physical Exam: BP 119/68   Pulse 83   Temp 97.9 F (36.6 C) (Temporal)   Ht 6\' 2"  (1.88 m)   Wt 273 lb 6.4 oz (124 kg)   SpO2 96%   BMI 35.10 kg/m   Body mass index is 35.1 kg/m. Wt Readings from Last 3 Encounters:  09/13/21 273 lb 6.4 oz (124 kg)  08/23/21 277 lb 8 oz (125.9 kg)  08/01/21 275 lb 6 oz (124.9 kg)   Gen: NAD, resting comfortably HEENT: TMs normal bilaterally. OP clear. No thyromegaly noted.  CV: RRR with no murmurs appreciated Pulm: NWOB, CTAB with no crackles, wheezes, or rhonchi GI: Normal bowel sounds present. Soft, Nontender, Nondistended. MSK: no edema, cyanosis, or clubbing noted Skin: warm, dry Neuro: CN2-12 grossly intact. Strength 5/5 in upper and lower extremities. Reflexes symmetric and intact bilaterally.  Psych: Normal affect and thought content      I,Savera Zaman,acting as a scribe for Dimas Chyle, MD.,have documented all relevant documentation on the behalf of Dimas Chyle, MD,as  directed by  Dimas Chyle, MD while in the presence of Dimas Chyle, MD.   I, Dimas Chyle, MD, have reviewed all documentation for this visit. The documentation on 09/13/21 for the exam, diagnosis, procedures, and orders are all accurate and complete.  Algis Greenhouse. [name], MD 09/13/2021 2:03 PM

## 2021-09-13 NOTE — Assessment & Plan Note (Signed)
Follows with endocrinology.  May benefit from GLP-1 agonist such as Ozempic in the future.

## 2021-09-13 NOTE — Assessment & Plan Note (Signed)
Follows with orthopedics.  No recent flares.  No red flags today.

## 2021-09-13 NOTE — Assessment & Plan Note (Signed)
At goal on losartan 50 mg daily, diltiazem 120 mg daily and metoprolol 100 mg daily.

## 2021-09-13 NOTE — Assessment & Plan Note (Signed)
Doing well status post ablation.  Regular rate and rhythm today.  Continue anticoagulation and rate control per cardiology.

## 2021-09-13 NOTE — Assessment & Plan Note (Signed)
Continue management per oncology. 

## 2021-09-13 NOTE — Assessment & Plan Note (Signed)
Continue lifestyle modifications. 

## 2021-09-13 NOTE — Assessment & Plan Note (Signed)
Continue management per urology he has been cardioverted 2 mg daily and finasteride 5 mg daily.

## 2021-09-13 NOTE — Patient Instructions (Signed)
It was very nice to see you today!  We will give you your flu shot today.  Please Talk with the endocrinologist about starting [place].  We will see you back in 1 year.  Come back sooner if needed.  Take care, Dr [name]  PLEASE NOTE:  If you had any lab tests please let us know if you have not heard back within a few days. You may see your results on mychart before we have a chance to review them but we will give you a call once they are reviewed by [place]. If we ordered any referrals today, please let us know if you have not heard from their office within the next week.   Please try these tips to maintain a healthy lifestyle:  Eat at least 3 REAL meals and 1-2 snacks per day.  Aim for no more than 5 hours between eating.  If you eat breakfast, please do so within one hour of getting up.   Each meal should contain half fruits/vegetables, one quarter protein, and one quarter carbs (no bigger than a computer mouse)  Cut down on sweet beverages. This includes juice, soda, and sweet tea.   Drink at least 1 glass of water with each meal and aim for at least 8 glasses per day  Exercise at least 150 minutes every week.    Preventive Care 26 Years and Older, Male Preventive care refers to lifestyle choices and visits with your health care provider that can promote health and wellness. Preventive care visits are also called wellness exams. What can I expect for my preventive care visit? Counseling During your preventive care visit, your health care provider may ask about your: Medical history, including: Past medical problems. Family medical history. History of falls. Current health, including: Emotional well-being. Home life and relationship well-being. Sexual activity. Memory and ability to understand (cognition). Lifestyle, including: Alcohol, nicotine or tobacco, and drug use. Access to firearms. Diet, exercise, and sleep habits. Work and work Statistician. Sunscreen use. Safety  issues such as seatbelt and bike helmet use. Physical exam Your health care provider will check your: Height and weight. These may be used to calculate your BMI (body mass index). BMI is a measurement that tells if you are at a healthy weight. Waist circumference. This measures the distance around your waistline. This measurement also tells if you are at a healthy weight and may help predict your risk of certain diseases, such as type 2 diabetes and high blood pressure. Heart rate and blood pressure. Body temperature. Skin for abnormal spots. What immunizations do I need? Vaccines are usually given at various ages, according to a schedule. Your health care provider will recommend vaccines for you based on your age, medical history, and lifestyle or other factors, such as travel or where you work. What tests do I need? Screening Your health care provider may recommend screening tests for certain conditions. This may include: Lipid and cholesterol levels. Diabetes screening. This is done by checking your blood sugar (glucose) after you have not eaten for a while (fasting). Hepatitis C test. Hepatitis B test. HIV (human immunodeficiency virus) test. STI (sexually transmitted infection) testing, if you are at risk. Lung cancer screening. Colorectal cancer screening. Prostate cancer screening. Abdominal aortic aneurysm (AAA) screening. You may need this if you are a current or former smoker. Talk with your health care provider about your test results, treatment options, and if necessary, the need for more tests. Follow these instructions at home: Eating and drinking  Eat a diet that includes fresh fruits and vegetables, whole grains, lean protein, and low-fat dairy products. Limit your intake of foods with high amounts of sugar, saturated fats, and salt. Take vitamin and mineral supplements as recommended by your health care provider. Do not drink alcohol if your health care provider tells  you not to drink. If you drink alcohol: Limit how much you have to 0-2 drinks a day. Know how much alcohol is in your drink. In the [place] one drink equals one 12 oz bottle of beer (355 mL), one 5 oz glass of wine (148 mL), or one 1 oz glass of hard liquor (44 mL). Lifestyle Brush your teeth every morning and night with fluoride toothpaste. Floss one time each day. Exercise for at least 30 minutes 5 or more days each week. Do not use any products that contain nicotine or tobacco. These products include cigarettes, chewing tobacco, and vaping devices, such as e-cigarettes. If you need help quitting, ask your health care provider. Do not use drugs. If you are sexually active, practice safe sex. Use a condom or other form of protection to prevent STIs. Take aspirin only as told by your health care provider. Make sure that you understand how much to take and what form to take. Work with your health care provider to find out whether it is safe and beneficial for you to take aspirin daily. Ask your health care provider if you need to take a cholesterol-lowering medicine (statin). Find healthy ways to manage stress, such as: Meditation, yoga, or listening to music. Journaling. Talking to a trusted person. Spending time with friends and family. Safety Always wear your seat belt while driving or riding in a vehicle. Do not drive: If you have been drinking alcohol. Do not ride with someone who has been drinking. When you are tired or distracted. While texting. If you have been using any mind-altering substances or drugs. Wear a helmet and other protective equipment during sports activities. If you have firearms in your house, make sure you follow all gun safety procedures. Minimize exposure to UV radiation to reduce your risk of skin cancer. What's next? Visit your health care provider once a year for an annual wellness visit. Ask your health care provider how often you should have your eyes and  teeth checked. Stay up to date on all vaccines. This information is not intended to replace advice given to you by your health care provider. Make sure you discuss any questions you have with your health care provider. Document Revised: 04/20/2021 Document Reviewed: 04/20/2021 Elsevier Patient Education  [place].

## 2021-09-19 ENCOUNTER — Ambulatory Visit: Payer: Medicare HMO | Admitting: Internal Medicine

## 2021-09-26 ENCOUNTER — Telehealth: Payer: Self-pay

## 2021-09-26 NOTE — Telephone Encounter (Signed)
Pt called wanting to know what time his bone scan appt was on tomorrow 09/27/2021 and where does he need to got.  Told pt his scan is scheduled for 1:45pm at the [facility].  Pt verbalized understanding and confirmed he will be at his appt on tomorrow.

## 2021-09-27 ENCOUNTER — Ambulatory Visit (HOSPITAL_COMMUNITY)
Admission: RE | Admit: 2021-09-27 | Discharge: 2021-09-27 | Disposition: A | Discharge status: Home / Self Care | Payer: Medicare HMO | Source: Ambulatory Visit | Attending: Nurse Practitioner | Admitting: Nurse Practitioner

## 2021-09-27 DIAGNOSIS — D472 Monoclonal gammopathy: Secondary | ICD-10-CM | POA: Insufficient documentation

## 2021-09-28 ENCOUNTER — Telehealth: Payer: Self-pay

## 2021-09-28 NOTE — Telephone Encounter (Signed)
This nurse reached out to patient and made aware of Bone Survey results and recommendations per Cira Rue, NP. Patient acknowledged understanding.  No further questions or concerns at this time.

## 2021-09-28 NOTE — Telephone Encounter (Signed)
-----   Message from Alla Feeling, NP sent at 09/28/2021 10:49 AM EST ----- Please let pt know bone survey is stable, no new lesions. No concerns from MGUS standpoint. Continue f/up with ortho for chronic back issues.  Thanks, Regan Rakers, NP

## 2021-11-08 ENCOUNTER — Ambulatory Visit: Payer: Medicare HMO | Admitting: Cardiology

## 2021-11-08 NOTE — Patient Instructions (Incomplete)
Medication Instructions:  °Your physician recommends that you continue on your current medications as directed. Please refer to the Current Medication list given to you today. °*If you need a refill on your cardiac medications before your next appointment, please call your pharmacy* ° °Lab Work: °None. °If you have labs (blood work) drawn today and your tests are completely normal, you will receive your results only by: °MyChart Message (if you have MyChart) OR °A paper copy in the mail °If you have any lab test that is abnormal or we need to change your treatment, we will call you to review the results. ° °Testing/Procedures: °None. ° °Follow-Up: °At [facility], you and your health needs are our priority.  As part of our continuing mission to provide you with exceptional heart care, we have created designated Provider Care Teams.  These Care Teams include your primary Cardiologist (physician) and Advanced Practice Providers (APPs -  Physician Assistants and Nurse Practitioners) who all work together to provide you with the care you need, when you need it. ° °Your physician wants you to follow-up in: 12 months with  Will Camnitz, MD or one of the following Advanced Practice Providers on your designated Care Team:   ° °Renee Ursuy, PA-C °Michael "Andy" Tillery, PA-C °  You will receive a reminder letter in the mail two months in advance. If you don't receive a letter, please call our office to schedule the follow-up appointment. ° °We recommend signing up for the patient portal called "MyChart".  Sign up information is provided on this After Visit Summary.  MyChart is used to connect with patients for Virtual Visits (Telemedicine).  Patients are able to view lab/test results, encounter notes, upcoming appointments, etc.  Non-urgent messages can be sent to your provider as well.   °To learn more about what you can do with MyChart, go to [email or web address].   ° °Any Other Special Instructions Will Be Listed  Below (If Applicable). ° ° ° ° °  ° ° °

## 2021-12-06 ENCOUNTER — Encounter: Payer: Self-pay | Admitting: Family Medicine

## 2021-12-06 ENCOUNTER — Ambulatory Visit (INDEPENDENT_AMBULATORY_CARE_PROVIDER_SITE_OTHER): Payer: Medicare HMO | Admitting: Family Medicine

## 2021-12-06 ENCOUNTER — Other Ambulatory Visit: Payer: Self-pay

## 2021-12-06 ENCOUNTER — Ambulatory Visit (HOSPITAL_COMMUNITY): Admission: RE | Admit: 2021-12-06 | Payer: Medicare HMO | Source: Ambulatory Visit

## 2021-12-06 DIAGNOSIS — G43809 Other migraine, not intractable, without status migrainosus: Secondary | ICD-10-CM

## 2021-12-06 DIAGNOSIS — G43909 Migraine, unspecified, not intractable, without status migrainosus: Secondary | ICD-10-CM | POA: Insufficient documentation

## 2021-12-06 NOTE — Patient Instructions (Signed)
It was very nice to see you today!  Your headache is probably due to your migraines.  We will check a CT scan to make sure there is nothing else going on.  Please let me know how things go with your eye appointment.  We may need to start a new migraine medication if your symptoms persist.  Take care, Dr [name]  PLEASE NOTE:  If you had any lab tests please let us know if you have not heard back within a few days. You may see your results on mychart before we have a chance to review them but we will give you a call once they are reviewed by [place]. If we ordered any referrals today, please let us know if you have not heard from their office within the next week.   Please try these tips to maintain a healthy lifestyle:  Eat at least 3 REAL meals and 1-2 snacks per day.  Aim for no more than 5 hours between eating.  If you eat breakfast, please do so within one hour of getting up.   Each meal should contain half fruits/vegetables, one quarter protein, and one quarter carbs (no bigger than a computer mouse)  Cut down on sweet beverages. This includes juice, soda, and sweet tea.   Drink at least 1 glass of water with each meal and aim for at least 8 glasses per day  Exercise at least 150 minutes every week.

## 2021-12-06 NOTE — Progress Notes (Signed)
° °  Gordon Phillips is a 76 y.o. male who presents today for an office visit.  Assessment/Plan:  New/Acute Problems: Headache Likely migraine.  Has reassuring neuro exam today.  It is a little unusual he would have worsening symptoms at his age and he also has a few other comorbidities including being anticoagulated on Eliquis as well as history of stroke.  We will check CT scan to rule out other possible causes such as intracranial hemorrhage, mass-effect, etc. he will be following up with his optometrist later today-this could be contributing as well.  We discussed reasons to return to care and seek emergent care.  Chronic Problems Addressed Today: Migraine Worsened recently.  Possibly due to refractive error -he will be following up with optometry later today to have this checked.  We will be checking a CT scan as above.  He is already on diltiazem.  If his above work-up is negative and migraines persist would consider increasing dose of diltiazem versus trial of alternative preventive therapy.     Subjective:  HPI:  Patient complain of frequent migraines. This has been present for the past several years. This is getting worse for the last few days. Located on right side of head. He notes he recently had one episode of migraine on 01/27.2023. This has not resolved. Symptoms have been better today. He thinks this is associated with his vision. He occasionally get aura when watching TV. He see flashes light around his eyes. This cause him having migraines. He takes Tynelol for this issue. He notes this has helped. His symptoms usually resolved after taking Tynelol. No weakness, numbness or tingling. Denies nausea or vomiting. Denies vision loss. He is seeing his ophthalmologist today,         Objective:  Physical Exam: BP (!) 142/80 (BP Location: Right Arm)    Pulse 71    Temp (!) 97.2 F (36.2 C) (Temporal)    Ht 6\' 2"  (1.88 m)    Wt 276 lb 3.2 oz (125.3 kg)    SpO2 98%    BMI 35.46 kg/m    Gen: No acute distress, resting comfortably CV: Regular rate and rhythm with no murmurs appreciated Pulm: Normal work of breathing, clear to auscultation bilaterally with no crackles, wheezes, or rhonchi Neuro: Grossly normal, moves all extremities Psych: Normal affect and thought content       I,Savera Zaman,acting as a scribe for Dimas Chyle, MD.,have documented all relevant documentation on the behalf of Dimas Chyle, MD,as directed by  Dimas Chyle, MD while in the presence of Dimas Chyle, MD.   I, Dimas Chyle, MD, have reviewed all documentation for this visit. The documentation on 12/06/21 for the exam, diagnosis, procedures, and orders are all accurate and complete.  Algis Greenhouse. [name], MD 12/06/2021 12:44 PM

## 2021-12-06 NOTE — Assessment & Plan Note (Signed)
Worsened recently.  Possibly due to refractive error -he will be following up with optometry later today to have this checked.  We will be checking a CT scan as above.  He is already on diltiazem.  If his above work-up is negative and migraines persist would consider increasing dose of diltiazem versus trial of alternative preventive therapy.

## 2021-12-08 ENCOUNTER — Other Ambulatory Visit: Payer: Self-pay

## 2021-12-08 MED ORDER — APIXABAN 5 MG PO TABS: 5.0000 mg | ORAL_TABLET | Freq: Two times a day (BID) | ORAL | 0 refills | Status: DC

## 2021-12-13 ENCOUNTER — Encounter (HOSPITAL_COMMUNITY): Payer: Self-pay

## 2021-12-13 ENCOUNTER — Ambulatory Visit (HOSPITAL_COMMUNITY)
Admission: RE | Admit: 2021-12-13 | Discharge: 2021-12-13 | Disposition: A | Discharge status: Home / Self Care | Payer: Medicare HMO | Source: Ambulatory Visit | Attending: Family Medicine | Admitting: Family Medicine

## 2021-12-13 ENCOUNTER — Other Ambulatory Visit: Payer: Self-pay

## 2021-12-13 DIAGNOSIS — G43809 Other migraine, not intractable, without status migrainosus: Secondary | ICD-10-CM | POA: Insufficient documentation

## 2021-12-15 NOTE — Progress Notes (Signed)
Please inform patient of the following:  His CT scan does not show any obvious causes for his migraines. He does have a finding that shows a very small stroke he had in the past but this is incidental and should not cause him any issues or explain his migraines.   I would like for him to let us know if his symptoms are not improving and we can discuss making medication changes.  Gordon Phillips. [name], MD 12/15/2021 9:56 AM

## 2021-12-21 ENCOUNTER — Other Ambulatory Visit: Payer: Self-pay | Admitting: Family Medicine

## 2022-01-10 ENCOUNTER — Other Ambulatory Visit: Payer: Self-pay

## 2022-01-10 ENCOUNTER — Ambulatory Visit (HOSPITAL_BASED_OUTPATIENT_CLINIC_OR_DEPARTMENT_OTHER): Payer: Medicare HMO | Admitting: Cardiology

## 2022-01-10 VITALS — BP 118/58 | HR 69 | Ht 74.0 in | Wt 281.6 lb

## 2022-01-10 DIAGNOSIS — I10 Essential (primary) hypertension: Secondary | ICD-10-CM

## 2022-01-10 DIAGNOSIS — D6869 Other thrombophilia: Secondary | ICD-10-CM | POA: Diagnosis not present

## 2022-01-10 DIAGNOSIS — I480 Paroxysmal atrial fibrillation: Secondary | ICD-10-CM

## 2022-01-10 DIAGNOSIS — Z7901 Long term (current) use of anticoagulants: Secondary | ICD-10-CM | POA: Diagnosis not present

## 2022-01-10 DIAGNOSIS — R002 Palpitations: Secondary | ICD-10-CM

## 2022-01-10 DIAGNOSIS — Z7189 Other specified counseling: Secondary | ICD-10-CM

## 2022-01-10 NOTE — Progress Notes (Signed)
Cardiology Office Note:    Date:  01/12/2022   ID:  Gordon Phillips, DOB 1946-10-27, MRN [id number]  PCP:  Vivi Barrack, MD  Cardiologist:  Buford Dresser, MD PhD EP: Dr. Caryl Comes  Referring MD: Vivi Barrack, MD   CC: follow up  History of Present Illness:    Gordon Phillips is a 76 y.o. male with a hx of atrial flutter, MGUS, back pain who is seen for follow up of his atrial flutter.  Cardiac history: 08/12/18 presented with new onset atrial flutter (initially called as a CODE STEMI). During his hospitalization, he underwent TEE/CV with conversion to NSR. He was seen for EP follow up post hospitalization to discuss flutter ablation on 09/17/18, but he was found to be in rate controlled atrial fibrillation at the time. Dofetilide load vs. Ablation discussed. After seeing Dr. Caryl Comes /2020, plan was for ablation of the flutter, but this was put on hold due to coronavirus. He underwent flutter ablation 04/23/19 by Dr. Caryl Comes.  Today: He is accompanied by his wife. He endorses fatigue in his lower extremities and windedness after exertion. Going up stairs and inclined paths cause his legs to hurt and feel heavy. He met with orthopedics and was told he has arthritis throughout his lower extremities. He continues to walk as much as he can at work.   When he goes to bed, he notices his heart racing. The racing episodes do not occur every night but will last hours. He does have a Kardia mobile device at home but has to change it batteries. He also uses a CPAP and nasal gel swabs and wonders if the carbon dioxide or benzalkonium chloride is affecting the palpitations.   Recently, he endorses migraines. The migraines are worsened with watching television and light. He saw his PCP and underwent a head CT which was unremarkable. The migraines resolved. However, he had an episode last night while watching television.   Since his last visit, he endorses occasional episodes of vertigo that occur primarily  when exiting a vehicle or standing up from sitting. For example, when exiting the car after a long trip, he had to hold on to the fence to steady himself. He describes the sensation as things moving around him. The episodes have associated tunnel vision. His wife believes they both have some form of hearing loss or inner ear issues.   He does not record his blood pressure at home.  Denies chest pain. No PND, orthopnea, LE edema or unexpected weight gain. No syncope.  Past Medical History:  Diagnosis Date   ALLERGIC RHINITIS 01/25/2010   Arthritis    "neck, back, shoulders," (02/20/2017)   Chronic lower back pain    COVID 06/17/2021   has lingering cough that subsding   High cholesterol    Hx of cardiovascular stress test    a. [facility] 02/06/13:  EF 59%, no ischemia or scar   Hx of echocardiogram    a.  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   Vitiligo  Past Surgical History:  Procedure Laterality Date   A-FLUTTER ABLATION N/A 04/23/2019   Procedure: A-FLUTTER ABLATION;  Surgeon: Deboraha Sprang, MD;  Location: [facility];  Service: Cardiovascular;  Laterality: N/A;   ATRIAL FIBRILLATION ABLATION N/A 03/30/2021   Procedure: ATRIAL FIBRILLATION ABLATION;  Surgeon: Constance Haw, MD;  Location: [place] [facility] [facility];  Service: Cardiovascular;  Laterality: N/A;   BIOPSY  09/29/2019   Procedure: BIOPSY;  Surgeon: Thornton Park, MD;  Location: [place];  Service: Gastroenterology;;   CARDIOVERSION N/A 08/14/2018   Procedure: PVVZSMOLMBEML;  Surgeon: [name], MD;  Location: [facility] ENDOSCOPY;  Service: Cardiovascular;  Laterality: N/A;   CARDIOVERSION N/A 01/04/2021   Procedure: CARDIOVERSION;  Surgeon: Pixie Casino, MD;  Location: [facility] ENDOSCOPY;  Service: Cardiovascular;  Laterality: N/A;   ESOPHAGOGASTRODUODENOSCOPY N/A 09/29/2019   Procedure: ESOPHAGOGASTRODUODENOSCOPY (EGD);  Surgeon: Thornton Park, MD;  Location: [place];  Service: Gastroenterology;  Laterality: N/A;   INGUINAL HERNIA REPAIR Bilateral    LUMBAR LAMINECTOMY     TEE WITHOUT CARDIOVERSION N/A 08/14/2018   Procedure: TRANSESOPHAGEAL ECHOCARDIOGRAM (TEE);  Surgeon: [name], MD;  Location: [facility] ENDOSCOPY;  Service: Cardiovascular;  Laterality: N/A;   TEE WITHOUT CARDIOVERSION N/A 03/30/2021   Procedure: TRANSESOPHAGEAL ECHOCARDIOGRAM (TEE);  Surgeon: Skeet Latch, MD;  Location: [facility]COPY;  Service: Cardiovascular;  Laterality: N/A;   UMBILICAL HERNIA REPAIR      Current Medications: Current Outpatient Medications on File Prior to Visit  Medication Sig   acetaminophen (TYLENOL) 650 MG CR tablet Take 1,300 mg by mouth daily as needed for pain.   Aloe-Sodium Chloride (AYR SALINE NASAL GEL NA) Place 1 application into the nose at bedtime as needed (congestion).   apixaban (ELIQUIS) 5 MG TABS tablet Take 1 tablet (5 mg total) by mouth 2 (two) times daily.   diltiazem (CARDIZEM CD) 120 MG 24 hr capsule TAKE 1 CAPSULE BY MOUTH EVERY DAY   doxazosin (CARDURA) 2 MG tablet TAKE 1 TABLET (2 MG TOTAL) BY MOUTH AT BEDTIME.   finasteride (PROSCAR) 5 MG tablet Take 5 mg by mouth daily.   glucose blood (ONETOUCH VERIO) test strip Use as instructed to check blood sugar once daily   hydrocortisone cream 1 % Apply 1 application topically 2 (two) times daily as needed for itching.   Lancets (ONETOUCH ULTRASOFT) lancets Use as instructed to check blood sugar once daily   losartan (COZAAR) 50 MG tablet TAKE 1 TABLET BY MOUTH EVERY DAY   metoprolol succinate (TOPROL-XL) 100 MG  24 hr tablet TAKE 1 TABLET (100 MG TOTAL) BY MOUTH AT BEDTIME. TAKE WITH OR IMMEDIATELY FOLLOWING A MEAL.   Misc Natural Products (PROSTATE THERAPY COMPLEX PO) Take 1 capsule by mouth in the morning. TruNature Prostate Plus Health Complex   OVER THE COUNTER MEDICATION Take 1 capsule by mouth at bedtime. OTC - Super Beta Prostate   pravastatin (PRAVACHOL) 40 MG tablet TAKE 1 TABLET BY MOUTH EVERYDAY AT BEDTIME   PRESCRIPTION MEDICATION Inhale into the lungs at bedtime. CPAP   sildenafil (VIAGRA) 100 MG tablet Take 100 mg by mouth daily as needed for erectile dysfunction.    sodium chloride (OCEAN) 0.65 % nasal spray Place 1 spray into the nose as needed for congestion.   vitamin C (ASCORBIC ACID) 500 MG tablet Take 500 mg by mouth 3 (three) times a week.   No current facility-administered medications on file prior to visit.     Allergies:   Flomax [tamsulosin hcl], Relpax [eletriptan],  and Other   Social History   Tobacco Use   Smoking status: Former    Packs/day: 0.50    Years: 8.00    Pack years: 4.00    Types: Cigarettes    Quit date: 1968    Years since quitting: 55.2   Smokeless tobacco: Never  Vaping Use   Vaping Use: Never used  Substance Use Topics   Alcohol use: Yes    Alcohol/week: 1.0 - 2.0 standard drink    Types: 1 - 2 Shots of liquor per week    Comment: 02/20/2017 "couple drinks/month on average"   Drug use: No    Family History: The patient's family history includes Stroke in his father; Sudden death in his brother; Throat cancer in his brother; Thyroid disease in his sister. There is no history of Colon cancer, Esophageal cancer, Rectal cancer, or Stomach cancer.  ROS:   Please see the history of present illness.   (+) Joint pain (lower extremities) (+) Fatigue (+) Shortness of breath (+) Palpitations (+) Migraines (+) Vertigo/dizziness Additional pertinent ROS negative except as noted.  EKGs/Labs/Other Studies Reviewed:    The following studies were  reviewed today: Ablation 03/30/21 1. Atrial flutter upon presentation.   2. Successful electrical isolation and anatomical encircling of all four pulmonary veins with radiofrequency current.  A WACA approach was used 3. Additional left atrial ablation was performed with a standard box lesion created along the posterior wall of the left atrium 4.  Ablation from the right superior pulmonary vein to the mitral valve and from the left superior pulmonary vein to the mitral valve line with termination of atrial flutter 5. No early apparent complications.  TEE Endo 03/30/21  1. Left ventricular ejection fraction, by estimation, is 40 to 45%. The  left ventricle has mildly decreased function. The left ventricle  demonstrates global hypokinesis.   2. Right ventricular systolic function is moderately reduced. The right  ventricular size is normal.   3. Left atrial size was severely dilated. No left atrial/left atrial  appendage thrombus was detected. The LAA emptying velocity was 78 cm/s.   4. The mitral valve is normal in structure. Trivial mitral valve  regurgitation. No evidence of mitral stenosis.   5. Calcification and partial fusion of the left and non-coronary cusps.  The aortic valve is calcified. There is mild calcification of the aortic  valve. There is mild thickening of the aortic valve. Aortic valve  regurgitation is not visualized. No aortic   stenosis is present.   6. There is mild (Grade II) atheroma plaque involving the descending  aorta.   Conclusion(s)/Recommendation(s): Normal biventricular function without  evidence of hemodynamically significant valvular heart disease.   Echo 02/07/21 1. Left ventricular ejection fraction, by estimation, is 55 to 60%. The  left ventricle has normal function. The left ventricle has no regional  wall motion abnormalities. There is mild concentric left ventricular  hypertrophy of the basal-septal segment.  Left ventricular diastolic parameters  are indeterminate.   2. Right ventricular systolic function is normal. The right ventricular  size is normal. There is mildly elevated pulmonary artery systolic  pressure.   3. The mitral valve is grossly normal. Trivial mitral valve  regurgitation. No evidence of mitral stenosis.   4. The aortic valve is tricuspid. There is mild thickening of the aortic  valve. Aortic valve regurgitation is not visualized. No aortic stenosis is  present.   5. The inferior vena cava is normal in size with greater than 50%  respiratory variability, suggesting right atrial pressure of 3 mmHg.   Comparison(s): A prior study was performed on 08/13/2018. Prior images  reviewed side by side. Slight increase in aortic valve thickness. Study  done at lower rates from prior.   Lower Extremity DVT Study 08/14/19 Right: No evidence of common femoral vein obstruction. No cystic structure  found in the popliteal fossa.  Left: There is no evidence of deep vein thrombosis in the lower extremity.  No cystic structure found in the popliteal fossa.  Ablation 04/23/19 CONCLUSIONS:  1. Sinus rhythm upon presentation.  2. Atrial flutter present on arrival and after more extensive mapping, septal flutter 3. DCCV to restore sinus rhythm 4. Empiric cavotricuspid isthmus ablation was performed with complete bidirectional isthmus block achieved.   5. No early apparent complications.  TEE 08/14/18 Study Conclusions  - Left ventricle: The cavity size was normal. Wall thickness was   normal. Systolic function was mildly to moderately reduced. The   estimated ejection fraction was in the range of 40% to 45%. - Left atrium: The atrium was dilated. No evidence of thrombus in   the appendage. - Right ventricle: The cavity size was dilated. Wall thickness was   normal. - Right atrium: The atrium was dilated. No evidence of thrombus in   the atrial cavity or appendage. - Atrial septum: No defect or patent foramen ovale was  identified.   Echo contrast study showed no right-to-left atrial level shunt,   following an increase in RA pressure induced by provocative   maneuvers. - Superior vena cava: The study excluded a thrombus.  Echo 08/13/18 - Left ventricle: The cavity size was normal. Wall thickness was   increased in a pattern of mild LVH. There was mild focal basal   hypertrophy of the septum. Systolic function was normal. The   estimated ejection fraction was in the range of 55% to 60%. Wall   motion was normal; there were no regional wall motion   abnormalities. The study is not technically sufficient to allow   evaluation of LV diastolic function. - Left atrium: The atrium was moderately dilated. - Pulmonary arteries: PA peak pressure: 34 mm Hg (S).   Impressions  - Normal LV function; mild LVH; moderate LAE; cannot R/O left   atrial mass; suggest TEE to further assess.  Lexiscan MPI 02/2017 Nuclear stress EF: 50%. There was no ST segment deviation noted during stress. The study is normal. This is a low risk study. The left ventricular ejection fraction is mildly decreased (45-54%).  EKG:  EKG is personally reviewed.  01/10/22: not ordered today 10/06/20: sinus rhythm with 1st degree AV block, HR 70 bpm  Recent Labs: 05/17/2021: TSH 0.83 08/23/2021: ALT 17; BUN 16; Creatinine 1.08; Hemoglobin 13.4; Platelet Count 145; Potassium 3.9; Sodium 139  Recent Lipid Panel    Component Value Date/Time   CHOL 151 05/17/2021 1420   TRIG 49.0 05/17/2021 1420   HDL 50.90 05/17/2021 1420   CHOLHDL 3 05/17/2021 1420   VLDL 9.8 05/17/2021 1420   LDLCALC 90 05/17/2021 1420   LDLDIRECT 139.2 11/21/2012 1122    Physical Exam:    VS:  BP (!) 118/58 (BP Location: Right Arm, Patient Position: Sitting)    Pulse 69    Ht _0  (1.88 m)    Wt 281 lb 9.6 oz (127.7 kg)    SpO2 97%    BMI 36.16 kg/m     Wt Readings from Last 3 Encounters:  01/10/22 281 lb 9.6 oz (  127.7 kg)  12/06/21 276 lb 3.2 oz (125.3 kg)   09/13/21 273 lb 6.4 oz (124 kg)    GEN: Well nourished, well developed in no acute distress HEENT: Normal, moist mucous membranes NECK: No JVD CARDIAC: regular rhythm, normal S1 and S2, no rubs or gallops. No murmur. VASCULAR: Radial and DP pulses 2+ bilaterally. No carotid bruits RESPIRATORY:  Clear to auscultation without rales, wheezing or rhonchi  ABDOMEN: Soft, non-tender, non-distended MUSCULOSKELETAL:  Ambulates independently SKIN: Warm and dry, no edema. Vitiligo. NEUROLOGIC:  Alert and oriented x 3. No focal neuro deficits noted. PSYCHIATRIC:  Normal affect   ASSESSMENT:    1. Heart palpitations   2. Paroxysmal atrial fibrillation (HCC)   3. Secondary hypercoagulable state ([place])   4. Long term current use of anticoagulant   5. Essential hypertension   6. Cardiac risk counseling     PLAN:    Palpitations atrial flutter, with paroxysmal atrial fibrillation:  -s/p flutter ablation -no recent hematuria -continue apixaban. Chadsvasc= at least 1 (old stroke seen on CT, age, hypertension) -on dual nodal agents with good control, as his atrial fibrillation goes very rapidly. Continue metoprolol and diltiazem -continue use of CPAP to treat OSA -we discussed the palpitations. It is more a sensation of feeling the heart beat, occurs when he lays down. Discussed chest anatomy and what can cause this. If sensations worsen, he will contact me.  Hypertension: at goal today -continue losartan, diltiazem and metoprolol succinate  CV risk counseling and Secondary prevention counseling: -recommend heart healthy/Mediterranean diet, with whole grains, fruits, vegetable, fish, lean meats, nuts, and olive oil. Limit salt. -recommend moderate walking, 3-5 times/week for 30-50 minutes each session. Aim for at least 150 minutes.week. Goal should be pace of 3 miles/hours, or walking 1.5 miles in 30 minutes -recommend avoidance of tobacco products. Avoid excess alcohol. -continue  pravastatin  Plan for follow up: 1 year or sooner PRN  Medication Adjustments/Labs and Tests Ordered: Current medicines are reviewed at length with the patient today.  Concerns regarding medicines are outlined above.  No orders of the defined types were placed in this encounter.  No orders of the defined types were placed in this encounter.   Patient Instructions  Medication Instructions:  Your Physician recommend you continue on your current medication as directed.    *If you need a refill on your cardiac medications before your next appointment, please call your pharmacy*   Lab Work: None ordered today   Testing/Procedures: None ordered today   Follow-Up: At [facility], you and your health needs are our priority.  As part of our continuing mission to provide you with exceptional heart care, we have created designated Provider Care Teams.  These Care Teams include your primary Cardiologist (physician) and Advanced Practice Providers (APPs -  Physician Assistants and Nurse Practitioners) who all work together to provide you with the care you need, when you need it.  We recommend signing up for the patient portal called "MyChart".  Sign up information is provided on this After Visit Summary.  MyChart is used to connect with patients for Virtual Visits (Telemedicine).  Patients are able to view lab/test results, encounter notes, upcoming appointments, etc.  Non-urgent messages can be sent to your provider as well.   To learn more about what you can do with MyChart, go to [email or web address].    Your next appointment:   1 year(s)  The format for your next appointment:   In Person  Provider:   Buford Dresser,  MD        Wilhemina Bonito as a scribe for Buford Dresser, MD.,have documented all relevant documentation on the behalf of Buford Dresser, MD,as directed by  Buford Dresser, MD while in the presence of Buford Dresser,  MD.  I, Buford Dresser, MD, have reviewed all documentation for this visit. The documentation on 01/12/22 for the exam, diagnosis, procedures, and orders are all accurate and complete.   Signed, Buford Dresser, MD PhD 01/12/2022  [place]

## 2022-01-10 NOTE — Patient Instructions (Signed)

## 2022-01-12 ENCOUNTER — Encounter (HOSPITAL_BASED_OUTPATIENT_CLINIC_OR_DEPARTMENT_OTHER): Payer: Self-pay | Admitting: Cardiology

## 2022-02-13 ENCOUNTER — Ambulatory Visit (INDEPENDENT_AMBULATORY_CARE_PROVIDER_SITE_OTHER): Payer: Medicare HMO | Admitting: Family Medicine

## 2022-02-13 ENCOUNTER — Encounter: Payer: Self-pay | Admitting: Family Medicine

## 2022-02-13 ENCOUNTER — Telehealth: Payer: Self-pay

## 2022-02-13 VITALS — BP 140/74 | HR 99 | Temp 97.8°F | Ht 74.0 in | Wt 281.0 lb

## 2022-02-13 DIAGNOSIS — R600 Localized edema: Secondary | ICD-10-CM

## 2022-02-13 DIAGNOSIS — R42 Dizziness and giddiness: Secondary | ICD-10-CM | POA: Diagnosis not present

## 2022-02-13 NOTE — Patient Instructions (Addendum)
It was very nice to see you today! ? ?Magnesium '250mg'$ -'400mg'$ /day ? ?Drink more water-lemon in one of them/day ?Compression stockings. ? ? ?PLEASE NOTE: ? ?If you had any lab tests please let us know if you have not heard back within a few days. You may see your results on MyChart before we have a chance to review them but we will give you a call once they are reviewed by [place]. If we ordered any referrals today, please let us know if you have not heard from their office within the next week.  ? ?Please try these tips to maintain a healthy lifestyle: ? ?Eat most of your calories during the day when you are active. Eliminate processed foods including packaged sweets (pies, cakes, cookies), reduce intake of potatoes, white bread, white pasta, and white rice. Look for whole grain options, oat flour or almond flour. ? ?Each meal should contain half fruits/vegetables, one quarter protein, and one quarter carbs (no bigger than a computer mouse). ? ?Cut down on sweet beverages. This includes juice, soda, and sweet tea. Also watch fruit intake, though this is a healthier sweet option, it still contains natural sugar! Limit to 3 servings daily. ? ?Drink at least 1 glass of water with each meal and aim for at least 8 glasses per day ? ?Exercise at least 150 minutes every week.   ?

## 2022-02-13 NOTE — Telephone Encounter (Signed)
Please see note.

## 2022-02-13 NOTE — Progress Notes (Signed)
? ?Subjective:  ? ? ? Patient ID: Gordon Phillips, male    DOB: 1946-04-09, 76 y.o.   MRN: [id number] ? ?Chief Complaint  ?Patient presents with  ? left lower leg pain  ?  Pt c/o shin pain on the left side that has been going on for months and has gotten worse.  ? Dizziness  ?  Pt c/o dizziness when waking up or driving in the car with vision changes or when seated on the couch and getting up.  ? ? ?HPI ? Dizziness for about 1 mo-no change in meds-when waking up or driving-getting up from sitting. Feels like "ready to have an aura" , getting up from sitting.  Was taking Lorazepam 0.'5mg'$  of wife's for sleep occ.  Was on for 2 wks.   Was getting a lot of auras-saw oph-was getting ha as well.  Stopped about 6 mo ago. Then dizziness started.  Drinking more water.  ?2.  L shin pain for sev months-getting worse.  Burning, inflammed, achey, cramps.  During rest only. Better now.  ? ?There are no preventive care reminders to display for this patient. ? ?Past Medical History:  ?Diagnosis Date  ? ALLERGIC RHINITIS 01/25/2010  ? Arthritis   ? "neck, back, shoulders," (02/20/2017)  ? Chronic lower back pain   ? COVID 06/17/2021  ? has lingering cough that subsding  ? High cholesterol   ? Hx of cardiovascular stress test   ? a. Lexiscan Myoview 02/06/13:  EF 59%, no ischemia or scar  ? Hx of echocardiogram   ? a.  Echo 02/03/13:  Mild LVH, mild focal basal septal hypertrophy, EF 50-55%, Gr 1 DD, mild LAE  ? HYPERTENSION 01/25/2010  ? HYPERTHYROIDISM 01/25/2010  ? S/P radioactive iodine "back in the 1980s" (02/20/2017)  ? Impaired glucose tolerance   ? MGUS (monoclonal gammopathy of unknown significance)   ? "an autoimmune thing"  ? Obesity   ? OSA on CPAP   ? Uses CPAP nightly  ? Pericarditis 1980's  ? Stroke [facility])   ? 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  ? Vitiligo   ? ? ?Past Surgical History:  ?Procedure Laterality Date  ? A-FLUTTER ABLATION N/A 04/23/2019  ? Procedure: A-FLUTTER ABLATION;  Surgeon: Deboraha Sprang, MD;  Location:  [place] [facility];  Service: Cardiovascular;  Laterality: N/A;  ? ATRIAL FIBRILLATION ABLATION N/A 03/30/2021  ? Procedure: ATRIAL FIBRILLATION ABLATION;  Surgeon: Constance Haw, MD;  Location: [place] [facility];  Service: Cardiovascular;  Laterality: N/A;  ? BIOPSY  09/29/2019  ? Procedure: BIOPSY;  Surgeon: Thornton Park, MD;  Location: [place];  Service: Gastroenterology;;  ? CARDIOVERSION N/A 08/14/2018  ? Procedure: CARDIOVERSION;  Surgeon: [name], MD;  Location: [facility] ENDOSCOPY;  Service: Cardiovascular;  Laterality: N/A;  ? CARDIOVERSION N/A 01/04/2021  ? Procedure: CARDIOVERSION;  Surgeon: Pixie Casino, MD;  Location: [facility] ENDOSCOPY;  Service: Cardiovascular;  Laterality: N/A;  ? ESOPHAGOGASTRODUODENOSCOPY N/A 09/29/2019  ? Procedure: ESOPHAGOGASTRODUODENOSCOPY (EGD);  Surgeon: Thornton Park, MD;  Location: [place];  Service: Gastroenterology;  Laterality: N/A;  ? INGUINAL HERNIA REPAIR Bilateral   ? LUMBAR LAMINECTOMY    ? TEE WITHOUT CARDIOVERSION N/A 08/14/2018  ? Procedure: TRANSESOPHAGEAL ECHOCARDIOGRAM (TEE);  Surgeon: [name], MD;  Location: [facility] ENDOSCOPY;  Service: Cardiovascular;  Laterality: N/A;  ? TEE WITHOUT CARDIOVERSION N/A 03/30/2021  ? Procedure: TRANSESOPHAGEAL ECHOCARDIOGRAM (TEE);  Surgeon: Skeet Latch, MD;  Location: [place];  Service: Cardiovascular;  Laterality: N/A;  ?  UMBILICAL HERNIA REPAIR    ? ? ?Outpatient Medications Prior to Visit  ?Medication Sig Dispense Refill  ? acetaminophen (TYLENOL) 650 MG CR tablet Take 1,300 mg by mouth daily as needed for pain.    ? Aloe-Sodium Chloride (AYR SALINE NASAL GEL NA) Place 1 application into the nose at bedtime as needed (congestion).    ? apixaban (ELIQUIS) 5 MG TABS tablet Take 1 tablet (5 mg total) by mouth 2 (two) times daily. 60 tablet 0  ? diltiazem (CARDIZEM CD) 120 MG 24 hr capsule TAKE 1 CAPSULE BY MOUTH EVERY DAY 90 capsule 3  ? finasteride (PROSCAR) 5 MG tablet Take 5 mg by mouth  daily.    ? glucose blood (ONETOUCH VERIO) test strip Use as instructed to check blood sugar once daily 100 each 3  ? hydrocortisone cream 1 % Apply 1 application topically 2 (two) times daily as needed for itching.    ? Lancets (ONETOUCH ULTRASOFT) lancets Use as instructed to check blood sugar once daily 100 each 3  ? losartan (COZAAR) 50 MG tablet TAKE 1 TABLET BY MOUTH EVERY DAY 90 tablet 1  ? metoprolol succinate (TOPROL-XL) 100 MG 24 hr tablet TAKE 1 TABLET (100 MG TOTAL) BY MOUTH AT BEDTIME. TAKE WITH OR IMMEDIATELY FOLLOWING A MEAL. 90 tablet 3  ? Misc Natural Products (PROSTATE THERAPY COMPLEX PO) Take 1 capsule by mouth in the morning. TruNature Prostate Plus Health Complex    ? OVER THE COUNTER MEDICATION Take 1 capsule by mouth at bedtime. OTC - Super Beta Prostate    ? pravastatin (PRAVACHOL) 40 MG tablet TAKE 1 TABLET BY MOUTH EVERYDAY AT BEDTIME 90 tablet 3  ? PRESCRIPTION MEDICATION Inhale into the lungs at bedtime. CPAP    ? sildenafil (VIAGRA) 100 MG tablet Take 100 mg by mouth daily as needed for erectile dysfunction.     ? sodium chloride (OCEAN) 0.65 % nasal spray Place 1 spray into the nose as needed for congestion.    ? vitamin C (ASCORBIC ACID) 500 MG tablet Take 500 mg by mouth 3 (three) times a week.    ? doxazosin (CARDURA) 2 MG tablet TAKE 1 TABLET (2 MG TOTAL) BY MOUTH AT BEDTIME. 90 tablet 2  ? ?No facility-administered medications prior to visit.  ? ? ?Allergies  ?Allergen Reactions  ? Flomax [Tamsulosin Hcl] Itching  ? Relpax [Eletriptan] Other (See Comments) and Hypertension  ?  Chest pain and sweating  ? Other Palpitations  ?  Any steroids cause heart palpitations  (tolerates low doses)  ? ?ROS neg/noncontributory except as noted HPI/below ?Mild edema at times. ?Eats 2x/day-hard to lose wt.   Wonders about ozempicHard to exercise d/t arthritis. ?Gets tired walking-nothing new ? ? ?   ?Objective:  ?  ? ?BP 140/74   Pulse 99   Temp 97.8 ?F (36.6 ?C)   Ht '6\' 2"'$  (1.88 m)   Wt 281 lb  (127.5 kg)   SpO2 97%   BMI 36.08 kg/m?  ?Wt Readings from Last 3 Encounters:  ?02/13/22 281 lb (127.5 kg)  ?01/10/22 281 lb 9.6 oz (127.7 kg)  ?12/06/21 276 lb 3.2 oz (125.3 kg)  ? ? ?Physical Exam   128/80 P 66 sitting,  122/82 standing(sl dizzy), 118/80 2 minutes ? ?Gen: WDWN NAD OAAM ?HEENT: NCAT, conjunctiva not injected, sclera nonicteric ?NECK:  supple, no thyromegaly, no nodes, no carotid bruits ?CARDIAC: sl irRRR, S1S2+, no murmur. DP 1+B ?EXT:  tr pitting edema LLE.  No calf tender. Sl  tender lower leg ant/medial above ankle ?MSK: no gross abnormalities.  ?NEURO: A&O x3.  CN II-XII intact.  ?PSYCH: normal mood. Good eye contact ?vitiligo ? ?   ?Assessment & Plan:  ? ?Problem List Items Addressed This Visit   ?None ?Visit Diagnoses   ? ? Local edema    -  Primary  ? Relevant Orders  ? VAS [place] LOWER EXTREMITY VENOUS (DVT)  ? Dizziness      ? ?  ? Dizziness-prob orthostatic, low HR, meds.  Drink more water,.  Get up slowly.  Wear compression stockings. Monitor bp's ?Edema-LLE.  Compression stockings.  Check doppler but doubt dvt(on doac).  ?Obesity-advised to d/w pcp ? ?No orders of the defined types were placed in this encounter. ? ? ?Wellington Hampshire, MD ? ?

## 2022-02-13 NOTE — Telephone Encounter (Signed)
Patient called in stating he believed he was having DVT symptoms.  He was requesting an appt with Dr. [name] for tomorrow (4/11) and possibly a scan.  I have transferred patient over to triage.

## 2022-02-13 NOTE — Telephone Encounter (Signed)
He needs appointment ASAP. Looks like he is seeing Dr Cherlynn Kaiser this afternoon. ? ?Gordon Phillips. [name], MD ?02/13/2022 2:31 PM  ? ?

## 2022-02-14 ENCOUNTER — Telehealth: Payer: Self-pay | Admitting: Family Medicine

## 2022-02-14 ENCOUNTER — Ambulatory Visit (HOSPITAL_COMMUNITY)
Admission: RE | Admit: 2022-02-14 | Discharge: 2022-02-14 | Disposition: A | Discharge status: Home / Self Care | Payer: Medicare HMO | Source: Ambulatory Visit | Attending: Family Medicine | Admitting: Family Medicine

## 2022-02-14 DIAGNOSIS — R600 Localized edema: Secondary | ICD-10-CM | POA: Diagnosis present

## 2022-02-14 NOTE — Telephone Encounter (Signed)
Pt was seen by Dr Cherlynn Kaiser Phillips 02/13/22 ? ? ?Patient ?Name: ?Gordon REARD ?Phillips ?Gender: Male ?DOB: 1945-12-22 ?Age: 76 Y 64 M 28 D ?Return ?Phone ?Number: ?[id number] ?(Primary), ?[id number] ?(Secondary) ?Address: ?City/ ?State/ ?Zip: ?[place] ? [address] ?Client [facility] - ?Client ?Presenter, broadcasting at [place] [employer] ?Provider Dimas Chyle- MD ?Contact Type Call ?Who Is Calling Patient / Member / Family / Caregiver ?Call Type Triage / Clinical ?Relationship To Patient Self ?Return Phone Number [phone] (Primary) ?Chief Complaint Leg Pain ?Reason for Call Symptomatic / Request for Health Information ?Initial Comment The caller states that he is experiencing pain and ?swelling in his lower left leg. The caller also ?reports that he is experiencing a burning sensation ?in the left leg. ?Translation No ?Nurse Assessment ?Nurse: Lucky Cowboy, RN, Levada Dy Date/Time (Eastern Time): 02/13/2022 1:07:01 PM ?Confirm and document reason for call. If ?symptomatic, describe symptoms. ?---Caller stated that he has pain, burning sensation to ?his LLE, and swelling. He's had this go Phillips for a while ?now. No redness. ?Does the patient have any new or worsening ?symptoms? ---Yes ?Will a triage be completed? ---Yes ?Related visit to physician within the last 2 weeks? ---No ?Does the PT have any chronic conditions? (i.e. ?diabetes, asthma, this includes High risk factors for ?pregnancy, etc.) ?---Yes ?List chronic conditions. ---a-fib, HTN, prostate ?Is this a behavioral health or substance abuse call? ---No ?Guidelines ?Guideline Title Affirmed Question Affirmed Notes Nurse Date/Time (Eastern ?Time) ?Leg Pain [1] Thigh or calf pain ?AND [2] only 1 side ?AND [3] present > ?1 hour (Exception: ?chronic unchanged ?pain) ?Lucky Cowboy, RN, Levada Dy 02/13/2022 1:08:53 ?PM ?Disp. Time (Eastern ?Time) Disposition Final User ?02/13/2022 1:11:07 PM See HCP within 4 Hours (or ?PCP triage) ?Yes Dew, RN, Levada Dy ?Caller  Disagree/Comply Comply ?Caller Understands Yes ?PreDisposition Call Doctor ?Care Advice Given Per Guideline ?SEE HCP (OR PCP TRIAGE) WITHIN 4 HOURS: * IF OFFICE WILL BE OPEN: You need to be seen within the next 3 or 4 ?hours. Call your doctor (or NP/PA) now or as soon as the office opens. CALL EMS IF: * You develop any chest pain or shortness of ?breath. CALL BACK IF: * You become worse CARE ADVICE given per Leg Pain (Adult) guideline. ?Comments ?User: Raford Pitcher, RN Date/Time Eilene Ghazi Time): 02/13/2022 1:13:22 PM ?Warm transferred caller to the backline of the office. ?Referrals ?REFERRED TO PCP OFFICE ?

## 2022-02-14 NOTE — Telephone Encounter (Signed)
Megan with [facility] stating she has results regarding this pt. Please call [phone] ?

## 2022-02-14 NOTE — Progress Notes (Signed)
Lower extremity venous has been completed.  ? ?Preliminary results in CV Proc.  ? ?Gordon Phillips ?02/14/2022 4:19 PM    ?

## 2022-02-15 ENCOUNTER — Other Ambulatory Visit: Payer: Self-pay | Admitting: Family Medicine

## 2022-02-21 ENCOUNTER — Inpatient Hospital Stay: Payer: Medicare HMO | Attending: Nurse Practitioner

## 2022-02-21 ENCOUNTER — Other Ambulatory Visit: Payer: Self-pay

## 2022-02-21 DIAGNOSIS — D472 Monoclonal gammopathy: Secondary | ICD-10-CM | POA: Diagnosis not present

## 2022-02-21 LAB — CMP (CANCER CENTER ONLY)
ALT: 12 U/L (ref 0–44)
AST: 16 U/L (ref 15–41)
Albumin: 4.3 g/dL (ref 3.5–5.0)
Alkaline Phosphatase: 50 U/L (ref 38–126)
Anion gap: 5 (ref 5–15)
BUN: 16 mg/dL (ref 8–23)
CO2: 28 mmol/L (ref 22–32)
Calcium: 9.2 mg/dL (ref 8.9–10.3)
Chloride: 107 mmol/L (ref 98–111)
Creatinine: 1.08 mg/dL (ref 0.61–1.24)
GFR, Estimated: >60 mL/min (ref >60)
Glucose, Bld: 78 mg/dL (ref 70–99)
Potassium: 4.2 mmol/L (ref 3.5–5.1)
Sodium: 140 mmol/L (ref 135–145)
Total Bilirubin: 0.7 mg/dL (ref 0.3–1.2)
Total Protein: 7.5 g/dL (ref 6.5–8.1)

## 2022-02-21 LAB — CBC WITH DIFFERENTIAL (CANCER CENTER ONLY)
Abs Immature Granulocytes: 0.02 10*3/uL (ref 0.00–0.07)
Basophils Absolute: 0 10*3/uL (ref 0.0–0.1)
Basophils Relative: 0 %
Eosinophils Absolute: 0.2 10*3/uL (ref 0.0–0.5)
Eosinophils Relative: 3 %
HCT: 39.1 % (ref 39.0–52.0)
Hemoglobin: 13.2 g/dL (ref 13.0–17.0)
Immature Granulocytes: 0 %
Lymphocytes Relative: 31 %
Lymphs Abs: 1.7 10*3/uL (ref 0.7–4.0)
MCH: 31.3 pg (ref 26.0–34.0)
MCHC: 33.8 g/dL (ref 30.0–36.0)
MCV: 92.7 fL (ref 80.0–100.0)
Monocytes Absolute: 0.6 10*3/uL (ref 0.1–1.0)
Monocytes Relative: 12 %
Neutro Abs: 2.9 10*3/uL (ref 1.7–7.7)
Neutrophils Relative %: 54 %
Platelet Count: 122 10*3/uL — ABNORMAL LOW (ref 150–400)
RBC: 4.22 MIL/uL (ref 4.22–5.81)
RDW: 13.8 % (ref 11.5–15.5)
WBC Count: 5.4 10*3/uL (ref 4.0–10.5)
nRBC: 0 % (ref 0.0–0.2)

## 2022-02-22 LAB — KAPPA/LAMBDA LIGHT CHAINS
Kappa free light chain: 14.9 mg/L (ref 3.3–19.4)
Kappa, lambda light chain ratio: 1.89 — ABNORMAL HIGH (ref 0.26–1.65)
Lambda free light chains: 7.9 mg/L (ref 5.7–26.3)

## 2022-02-24 ENCOUNTER — Telehealth (HOSPITAL_BASED_OUTPATIENT_CLINIC_OR_DEPARTMENT_OTHER): Payer: Self-pay | Admitting: Cardiology

## 2022-02-24 NOTE — Telephone Encounter (Signed)
Do not recommend holding anticoagulation for single dental extraction. If this is more extensive with bone graft, ok to hold Eliquis for 1 day prior. Should not hold longer given his history of afib and stroke. He should resume anticoag as soon as safely possible after. He does not require SBE ppx. ?

## 2022-02-24 NOTE — Telephone Encounter (Signed)
? ?  Pre-operative Risk Assessment  ?  ?Patient Name: Gordon Phillips  ?DOB: [birth year] ?MRN: [id number]  ? ? ? ?Request for Surgical Clearance   ? ?Procedure:  Dental Extraction - Amount of Teeth to be Pulled:  1 ?Bone Graft and preparation for dental implant  ? ?Date of Surgery:  Clearance 02/27/22                              ?   ?Surgeon:  Unknown ?Surgeon's Group or Practice Name:  [facility] ?Phone number:  [phone] ?Fax number:   ?  ?Type of Clearance Requested:   ?- Pharmacy:  Hold Apixaban (Eliquis) 3 days ?  ?Type of Anesthesia:  not sure  ?  ?Additional requests/questions:   ? ?Signed, ?Johnna Acosta   ?02/24/2022, 12:33 PM   ?

## 2022-02-27 ENCOUNTER — Other Ambulatory Visit: Payer: Self-pay | Admitting: Cardiology

## 2022-02-27 LAB — MULTIPLE MYELOMA PANEL, SERUM
Albumin SerPl Elph-Mcnc: 4.2 g/dL (ref 2.9–4.4)
Albumin/Glob SerPl: 1.4 (ref 0.7–1.7)
Alpha 1: 0.2 g/dL (ref 0.0–0.4)
Alpha2 Glob SerPl Elph-Mcnc: 0.9 g/dL (ref 0.4–1.0)
B-Globulin SerPl Elph-Mcnc: 0.9 g/dL (ref 0.7–1.3)
Gamma Glob SerPl Elph-Mcnc: 1.2 g/dL (ref 0.4–1.8)
Globulin, Total: 3.2 g/dL (ref 2.2–3.9)
IgA: 104 mg/dL (ref 61–437)
IgG (Immunoglobin G), Serum: 1268 mg/dL (ref 603–1613)
IgM (Immunoglobulin M), Srm: 59 mg/dL (ref 15–143)
M Protein SerPl Elph-Mcnc: 0.5 g/dL — ABNORMAL HIGH
Total Protein ELP: 7.4 g/dL (ref 6.0–8.5)

## 2022-02-27 NOTE — Telephone Encounter (Signed)
? ?  Patient Name: Gordon Phillips  ?DOB: 06/10/1946 ?MRN: [id number] ? ?Primary Cardiologist: Buford Dresser, MD ? ?Chart reviewed as part of pre-operative protocol coverage. We asked for guidance to hold eliquis for single dental extraction.  ? ?Per our clinical pharmacist: ?Do not recommend holding anticoagulation for single dental extraction. If this is more extensive with bone graft, ok to hold Eliquis for 1 day prior. Should not hold longer given his history of afib and stroke. He should resume anticoag as soon as safely possible after.  ? ? ?SBE prophylaxis is not required for the patient from a cardiac standpoint. ? ?I will route this recommendation to the requesting party via Epic fax function and remove from pre-op pool. ? ?Please call with questions. ? ?Ledora Bottcher, PA ?02/27/2022, 8:21 AM ? ?

## 2022-02-27 NOTE — Telephone Encounter (Signed)
I tried as well to reach the pt per the pre op provider today. No answer or vm came on. See notes from pre op provider and pharm-d.  ? ?I then called the dental office for a fax #. I was give fax# [phone] Dr. Marily Memos. I did inform their office as the pt called [place] an told our office that he was having dental work done. I informed that we never received a clearance request from their office. I stated that per the information form the pt that he was told to hold his Eliquis x 3 days. See notes from pharm-d that the hold time did not to exceed more than a 1 day hold time as the pt has a history of stroke and afib.  ?

## 2022-02-28 NOTE — Telephone Encounter (Signed)
Prescription refill request for Eliquis received. ?Indication:afib ?Last office visit:01/10/22 ?Scr:1.08 ?Age: 76 ?Weight:127.5kg ? ?

## 2022-03-20 ENCOUNTER — Ambulatory Visit (INDEPENDENT_AMBULATORY_CARE_PROVIDER_SITE_OTHER): Payer: Medicare HMO | Admitting: Family Medicine

## 2022-03-20 ENCOUNTER — Encounter: Payer: Self-pay | Admitting: Family Medicine

## 2022-03-20 VITALS — BP 140/75 | HR 73 | Temp 97.3°F | Ht 74.0 in | Wt 271.6 lb

## 2022-03-20 DIAGNOSIS — G8929 Other chronic pain: Secondary | ICD-10-CM | POA: Diagnosis not present

## 2022-03-20 DIAGNOSIS — M549 Dorsalgia, unspecified: Secondary | ICD-10-CM

## 2022-03-20 DIAGNOSIS — I10 Essential (primary) hypertension: Secondary | ICD-10-CM

## 2022-03-20 DIAGNOSIS — I4819 Other persistent atrial fibrillation: Secondary | ICD-10-CM | POA: Diagnosis not present

## 2022-03-20 NOTE — Progress Notes (Signed)
? ?  Gordon Phillips is a 76 y.o. male who presents today for an office visit. ? ?Assessment/Plan:  ?Chronic Problems Addressed Today: ?Back pain, chronic ?Continue management per orthopedics.  He will start using glucosamine/chondroitin to see if this helps. ? ?Persistent atrial fibrillation ([place]) ?Irregular rate and rhythm today.  He will continue anticoagulation on Eliquis and rate control with metoprolol and diltiazem.  He will follow-up with cardiology in a couple of weeks. ? ?Essential hypertension ?Blood pressures at goal though is having some orthostatic symptoms.  This orthostatic vital signs today are within normal limits.  He did not have any dizziness while we are checking this.  It is possible he could be having some side effects with some of his antihypertensives though we will defer any further changes till he discusses with his cardiologist in a couple of weeks.   ? ?  ?Subjective:  ?HPI: ? ?See A/p for status of chronic conditions.  ? ?His main concern today is dizziness when standing. This has been going on for about 6 months.  Overall stable.  No passing out.  No weakness or numbness.  No loss of vision.  He is concerned about possible orthostatic hypotension. ? ?   ?  ?Objective:  ?Physical Exam: ?BP 140/75   Pulse 73   Temp (!) 97.3 ?F (36.3 ?C) (Temporal)   Ht '6\' 2"'$  (1.88 m)   Wt 271 lb 9.6 oz (123.2 kg)   SpO2 97%   BMI 34.87 kg/m?   ?Gen: No acute distress, resting comfortably ?CV: Irregular rate and rhythm with no murmurs appreciated ?Pulm: Normal work of breathing, clear to auscultation bilaterally with no crackles, wheezes, or rhonchi ?Neuro: Grossly normal, moves all extremities ?Psych: Normal affect and thought content ? ?   ? ?Algis Greenhouse. [name], MD ?03/20/2022 2:00 PM  ?

## 2022-03-20 NOTE — Assessment & Plan Note (Addendum)
Blood pressures at goal though is having some orthostatic symptoms.  This orthostatic vital signs today are within normal limits.  He did not have any dizziness while we are checking this.  It is possible he could be having some side effects with some of his antihypertensives though we will defer any further changes till he discusses with his cardiologist in a couple of weeks.   ?

## 2022-03-20 NOTE — Assessment & Plan Note (Signed)
Continue management per orthopedics.  He will start using glucosamine/chondroitin to see if this helps. ?

## 2022-03-20 NOTE — Assessment & Plan Note (Signed)
Irregular rate and rhythm today.  He will continue anticoagulation on Eliquis and rate control with metoprolol and diltiazem.  He will follow-up with cardiology in a couple of weeks. ?

## 2022-03-20 NOTE — Patient Instructions (Addendum)
It was very nice to see you today! ? ?Please talk with your cardiologist about your blood pressure medications.   ? ?We will see you back in the fall for your annual physical.  Please come back to see [place] sooner if needed. ? ?Take care, ?Dr [name] ? ?PLEASE NOTE: ? ?If you had any lab tests please let us know if you have not heard back within a few days. You may see your results on mychart before we have a chance to review them but we will give you a call once they are reviewed by [place]. If we ordered any referrals today, please let us know if you have not heard from their office within the next week.  ? ?Please try these tips to maintain a healthy lifestyle: ? ?Eat at least 3 REAL meals and 1-2 snacks per day.  Aim for no more than 5 hours between eating.  If you eat breakfast, please do so within one hour of getting up.  ? ?Each meal should contain half fruits/vegetables, one quarter protein, and one quarter carbs (no bigger than a computer mouse) ? ?Cut down on sweet beverages. This includes juice, soda, and sweet tea.  ? ?Drink at least 1 glass of water with each meal and aim for at least 8 glasses per day ? ?Exercise at least 150 minutes every week.   ?

## 2022-03-27 ENCOUNTER — Encounter: Payer: Self-pay | Admitting: Internal Medicine

## 2022-03-27 ENCOUNTER — Ambulatory Visit (INDEPENDENT_AMBULATORY_CARE_PROVIDER_SITE_OTHER): Payer: Medicare HMO | Admitting: Internal Medicine

## 2022-03-27 VITALS — BP 120/74 | HR 98 | Ht 74.0 in | Wt 275.6 lb

## 2022-03-27 DIAGNOSIS — Z8639 Personal history of other endocrine, nutritional and metabolic disease: Secondary | ICD-10-CM

## 2022-03-27 DIAGNOSIS — R7303 Prediabetes: Secondary | ICD-10-CM

## 2022-03-27 DIAGNOSIS — E7849 Other hyperlipidemia: Secondary | ICD-10-CM

## 2022-03-27 LAB — LIPID PANEL
Cholesterol: 124 mg/dL (ref 0–200)
HDL: 47.5 mg/dL (ref >39.00)
LDL Cholesterol: 67 mg/dL (ref 0–99)
NonHDL: 76.21
Total CHOL/HDL Ratio: 3
Triglycerides: 47 mg/dL (ref 0.0–149.0)
VLDL: 9.4 mg/dL (ref 0.0–40.0)

## 2022-03-27 LAB — POCT GLYCOSYLATED HEMOGLOBIN (HGB A1C): Hemoglobin A1C: 5.8 % — AB (ref 4.0–5.6)

## 2022-03-27 LAB — T3, FREE: T3, Free: 2.5 pg/mL (ref 2.3–4.2)

## 2022-03-27 LAB — TSH: TSH: 0.71 u[IU]/mL (ref 0.35–5.50)

## 2022-03-27 LAB — T4, FREE: Free T4: 0.77 ng/dL (ref 0.60–1.60)

## 2022-03-27 NOTE — Patient Instructions (Addendum)
Please check sugars once a day or every other day.  Please let me know if the sugars are consistently <70 or >160.  Please return in 6 months with your sugar log.

## 2022-03-27 NOTE — Progress Notes (Addendum)
Patient ID: Gordon Phillips, male   DOB: 31-May-1946, 76 y.o.   MRN: [id number]   This visit occurred during the SARS-CoV-2 public health emergency.  Safety protocols were in place, including screening questions prior to the visit, additional usage of staff PPE, and extensive cleaning of exam room while observing appropriate contact time as indicated for disinfecting solutions.   HPI: Gordon Phillips is a 76 y.o.-year-old male, initially referred by his PCP, Dr. [name], returning for follow-up for prediabetes, dx in ~2013 and history of Graves' disease.  His wife, Yehia Mcbain, is also my patient.  Last visit 10 months ago. Last visit 6 months ago.  Interim history: No increased urination, blurry vision, nausea, chest pain.  He has back pain  - he had RFA of his back, but pain persists. He had steroid inj's in back - these helped. He is now on chondroitin sulfate, but may need another injection. He stopped juice and other sweet drinks.He also reduced starches, no fried foods. He lost 5 lbs. He had orthostatic HTN - saw PCP recently.  He is trying to drink more water.  Reviewed history: Patient has a history of impaired glucose tolerance/prediabetes, but he was found to have an HbA1c in the diabetic range at last check in 08/2019.   He tells me that he knows why his HbA1c was higher: he was working part-time, but stopped working at the start of the coronavirus pandemic and started to eat more.   After the HbA1c returned high >> he started to change his diet: reduced bread, stopped sweets, increased fruit and veggies. He also tried to restart exercise but hurt his back so he is not taking it slowly.  After the HbA1c returned higher, he was started on Metformin 500 mg daily by PCP but he could not tolerate it due to GI symptoms >> abdominal pain and diarrhea. He would like to avoid medications if possible.  Reviewed HbA1c levels: Lab Results  Component Value Date   HGBA1C 6.0 (A) 05/17/2021   HGBA1C  6.0 (A) 11/09/2020   HGBA1C 5.7 (A) 02/02/2020   HGBA1C 6.5 08/26/2019   HGBA1C 6.1 (H) 02/03/2013   HGBA1C 6.2 05/27/2012   He is not on any medication for his prediabetes.  He is not checking sugars. Lasst: - am: 80-90s - 2h after b'fast: n/c - lunch: n/c - 2h after lunch: n/c - dinner: n/c - 2h after dinner: n/c - bedtime: n/c It is unclear at which level he has hypoglycemia awareness.  Glucometer: One Touch verio  Pt's meals are: 2 meals a day now - varies: - Breakfast: Oatmeal + fruit (banana + grapes) 3x a week; eggs + grits + bacon/sausage; tuna sandwich or tuna salad; eggs + sardines + toast; granola bar >> more oatmeal + grapes + banana - Lunch: skips or peanuts or PB crackers; [employer] - Dinner:  Salmon or pork chop + veggies + rice or potatoes - Snacks: stopped icecream, juice + sodas, grapes, cookies  -+ Mild CKD, last BUN/creatinine:  Lab Results  Component Value Date   BUN 16 02/21/2022   BUN 16 08/23/2021   CREATININE 1.08 02/21/2022   CREATININE 1.08 08/23/2021  On valsartan.  -+ HL; last set of lipids: Lab Results  Component Value Date   CHOL 151 05/17/2021   HDL 50.90 05/17/2021   LDLCALC 90 05/17/2021   LDLDIRECT 139.2 11/21/2012   TRIG 49.0 05/17/2021   CHOLHDL 3 05/17/2021  On pravastatin 40.  - last eye exam was in  2023: No DR reportedly; + glaucoma.  - no numbness and tingling in his feet.  Pt has no FH of DM.  He has a history of stroke (left basal ganglia infarct) in 01/2013 -previously on Eliquis, now off.  He also has MGUS, vitiligo, h/o pericarditis.  He has a history of Graves' disease, s/p RAI treatment in the 55s.  Review latest TFTs: Lab Results  Component Value Date   TSH 0.83 05/17/2021   TSH 0.773 09/29/2019   TSH 0.67 08/26/2019   TSH 1.442 08/12/2018   TSH 0.57 09/18/2017   He is on Toprol-XL.  ROS: + See HPI  Past Medical History:  Diagnosis Date   ALLERGIC RHINITIS 01/25/2010   Arthritis    "neck, back,  shoulders," (02/20/2017)   Chronic lower back pain    COVID 06/17/2021   has lingering cough that subsding   High cholesterol    Hx of cardiovascular stress test    a. [place] 02/06/13:  EF 59%, no ischemia or scar   Hx of echocardiogram    a.  [place] 02/03/13:  Mild LVH, mild focal basal septal hypertrophy, EF 50-55%, Gr 1 DD, mild LAE   HYPERTENSION 01/25/2010   HYPERTHYROIDISM 01/25/2010   S/P radioactive iodine "back in the 1980s" (02/20/2017)   Impaired glucose tolerance    MGUS (monoclonal gammopathy of unknown significance)    "an autoimmune thing"   Obesity    OSA on CPAP    Uses CPAP nightly   Pericarditis 1980's   Stroke ([place])    Old L basal ganglia infarct by CT 01/2013.   Vitiligo    Past Surgical History:  Procedure Laterality Date   A-FLUTTER ABLATION N/A 04/23/2019   Procedure: A-FLUTTER ABLATION;  Surgeon: Deboraha Sprang, MD;  Location: [place] [facility];  Service: Cardiovascular;  Laterality: N/A;   ATRIAL FIBRILLATION ABLATION N/A 03/30/2021   Procedure: ATRIAL FIBRILLATION ABLATION;  Surgeon: Constance Haw, MD;  Location: [place] [facility];  Service: Cardiovascular;  Laterality: N/A;   BIOPSY  09/29/2019   Procedure: BIOPSY;  Surgeon: Thornton Park, MD;  Location: [place];  Service: Gastroenterology;;   CARDIOVERSION N/A 08/14/2018   Procedure: [id number];  Surgeon: [name], MD;  Location: [facility] ENDOSCOPY;  Service: Cardiovascular;  Laterality: N/A;   CARDIOVERSION N/A 01/04/2021   Procedure: CARDIOVERSION;  Surgeon: Pixie Casino, MD;  Location: [facility] ENDOSCOPY;  Service: Cardiovascular;  Laterality: N/A;   ESOPHAGOGASTRODUODENOSCOPY N/A 09/29/2019   Procedure: ESOPHAGOGASTRODUODENOSCOPY (EGD);  Surgeon: Thornton Park, MD;  Location: [place];  Service: Gastroenterology;  Laterality: N/A;   INGUINAL HERNIA REPAIR Bilateral    LUMBAR LAMINECTOMY     TEE WITHOUT CARDIOVERSION N/A 08/14/2018   Procedure: TRANSESOPHAGEAL  ECHOCARDIOGRAM (TEE);  Surgeon: [name], MD;  Location: [facility] ENDOSCOPY;  Service: Cardiovascular;  Laterality: N/A;   TEE WITHOUT CARDIOVERSION N/A 03/30/2021   Procedure: TRANSESOPHAGEAL ECHOCARDIOGRAM (TEE);  Surgeon: Skeet Latch, MD;  Location: [facility] ENDOSCOPY;  Service: Cardiovascular;  Laterality: N/A;   UMBILICAL HERNIA REPAIR     Social History   Socioeconomic History   Marital status: Married    Spouse name: Not on file   Number of children: 2   Years of education: Not on file   Highest education level: Not on file  Occupational History   Occupation: sales   Occupation: retired  Tobacco Use   Smoking status: Former    Packs/day: 0.50    Years: 8.00    Pack years: 4.00  Types: Cigarettes    Quit date: 48    Years since quitting: 55.4   Smokeless tobacco: Never  Vaping Use   Vaping Use: Never used  Substance and Sexual Activity   Alcohol use: Yes    Alcohol/week: 1.0 - 2.0 standard drink    Types: 1 - 2 Shots of liquor per week    Comment: 02/20/2017 "couple drinks/month on average"   Drug use: No   Sexual activity: Yes  Other Topics Concern   Not on file  Social History Narrative   Not on file   Social Determinants of Health   Financial Resource Strain: Low Risk    Difficulty of Paying Living Expenses: Not hard at all  Food Insecurity: No Food Insecurity   Worried About Charity fundraiser in the Last Year: Never true   [place] in the Last Year: Never true  Transportation Needs: No Transportation Needs   Lack of Transportation (Medical): No   Lack of Transportation (Non-Medical): No  Physical Activity: Inactive   Days of Exercise per Week: 0 days   Minutes of Exercise per Session: 0 min  Stress: No Stress Concern Present   Feeling of Stress : Not at all  Social Connections: Moderately Isolated   Frequency of Communication with Friends and Family: More than three times a week   Frequency of Social Gatherings with Friends and Family: More  than three times a week   Attends Religious Services: Never   Marine scientist or Organizations: No   Attends Music therapist: Never   Marital Status: Married  Human resources officer Violence: Not At Risk   Fear of Current or Ex-Partner: No   Emotionally Abused: No   Physically Abused: No   Sexually Abused: No   Current Outpatient Medications on File Prior to Visit  Medication Sig Dispense Refill   acetaminophen (TYLENOL) 650 MG CR tablet Take 1,300 mg by mouth daily as needed for pain.     Aloe-Sodium Chloride (AYR SALINE NASAL GEL NA) Place 1 application into the nose at bedtime as needed (congestion).     apixaban (ELIQUIS) 5 MG TABS tablet TAKE 1 TABLET BY MOUTH TWICE A DAY 180 tablet 1   diltiazem (CARDIZEM CD) 120 MG 24 hr capsule TAKE 1 CAPSULE BY MOUTH EVERY DAY 90 capsule 3   doxazosin (CARDURA) 2 MG tablet TAKE 1 TABLET (2 MG TOTAL) BY MOUTH AT BEDTIME. 90 tablet 2   finasteride (PROSCAR) 5 MG tablet Take 5 mg by mouth daily.     glucose blood (ONETOUCH VERIO) test strip Use as instructed to check blood sugar once daily 100 each 3   hydrocortisone cream 1 % Apply 1 application topically 2 (two) times daily as needed for itching.     Lancets (ONETOUCH ULTRASOFT) lancets Use as instructed to check blood sugar once daily 100 each 3   losartan (COZAAR) 50 MG tablet TAKE 1 TABLET BY MOUTH EVERY DAY 90 tablet 1   metoprolol succinate (TOPROL-XL) 100 MG 24 hr tablet TAKE 1 TABLET (100 MG TOTAL) BY MOUTH AT BEDTIME. TAKE WITH OR IMMEDIATELY FOLLOWING A MEAL. 90 tablet 3   Misc Natural Products (PROSTATE THERAPY COMPLEX PO) Take 1 capsule by mouth in the morning. TruNature Prostate Plus Health Complex     OVER THE COUNTER MEDICATION Take 1 capsule by mouth at bedtime. OTC - Super Beta Prostate     pravastatin (PRAVACHOL) 40 MG tablet TAKE 1 TABLET BY MOUTH EVERYDAY AT  BEDTIME 90 tablet 3   PRESCRIPTION MEDICATION Inhale into the lungs at bedtime. CPAP     sildenafil  (VIAGRA) 100 MG tablet Take 100 mg by mouth daily as needed for erectile dysfunction.      sodium chloride (OCEAN) 0.65 % nasal spray Place 1 spray into the nose as needed for congestion.     vitamin C (ASCORBIC ACID) 500 MG tablet Take 500 mg by mouth 3 (three) times a week.     No current facility-administered medications on file prior to visit.   Allergies  Allergen Reactions   Flomax [Tamsulosin Hcl] Itching   Relpax [Eletriptan] Other (See Comments) and Hypertension    Chest pain and sweating   Other Palpitations    Any steroids cause heart palpitations  (tolerates low doses)   Family History  Problem Relation Age of Onset   Stroke Father        Passed away in his 55s   Thyroid disease Sister        goiter   Sudden death Brother        ? Drug use, no autopsy   Throat cancer Brother    Colon cancer Neg Hx    Esophageal cancer Neg Hx    Rectal cancer Neg Hx    Stomach cancer Neg Hx    PE: BP 120/74 (BP Location: Right Arm, Patient Position: Sitting, Cuff Size: Normal)   Pulse 98   Ht '6\' 2"'$  (1.88 m)   Wt 275 lb 9.6 oz (125 kg)   SpO2 92%   BMI 35.38 kg/m  Wt Readings from Last 3 Encounters:  03/27/22 275 lb 9.6 oz (125 kg)  03/20/22 271 lb 9.6 oz (123.2 kg)  02/13/22 281 lb (127.5 kg)   Constitutional: overweight, in NAD Eyes: PERRLA, EOMI, no exophthalmos ENT: moist mucous membranes, no thyromegaly, no cervical lymphadenopathy Cardiovascular: RRR, No MRG Respiratory: CTA B Musculoskeletal: no deformities, strength intact in all 4 Skin: moist, warm, + vitiligo rash over arms and neck, legs Neurological: + tremor with outstretched left hand, DTR normal in all 4  ASSESSMENT: 1.  Prediabetes  2.  History of Graves' disease  3.  Hyperlipidemia  PLAN:  1. Patient with history of prediabetes, not medication.  HbA1c at last visit was still at goal, stable, at 6.0%.  At that time, he was drinking sweet drinks and I advised him to stop.  He was not checking sugars  and we gave him a One Touch Verio flex glucometer and sent prescription for test strips and lancets to his pharmacy.  I advised him to check every day or every other day.  He was sedentary due to back pain but was planning to have surgery.  However, since last visit, he got steroid injections, which he feels helped.  Now also on chondroitin sulfate. -At today's visit, he is still not checking blood sugars.  He does have a glucometer at home.  I advised her to ideally check every day or every other day, but at least before coming to the next appointment, so we have more data at the time of the appointment. -For now, we will continue without medications -HbA1c excellent despite steroids (see below) - I suggested to:  Patient  instructions  Please check sugars once a day or every other day.  Please let me know if the sugars are consistently <70 or >160.  Please return in 6 months with your sugar log.   - we checked his HbA1c: 5.8% (lower) -  advised for yearly eye exams >> he is UTD -At today's visit, he inquires about Ozempic.  I advised him that he does not qualify for this since he does not have diabetes, but may qualify to [facility].  He can definitely try this, but I advised him that I do not usually prescribe it only for weight loss.  He is reticent to try this class of medicines as he feels that there is not enough experience with him.  For now, he is planning to continue with his almost plant-based diet. - return to clinic in 6 months  2. H/o Graves' disease -No signs or symptoms of thyrotoxicosis -He is status post RAI treatment in the 9s -We are following him with yearly TFTs, last normal 10 months ago: Lab Results  Component Value Date   TSH 0.83 05/17/2021  -We will recheck these today  3.  Hyperlipidemia -Reviewed latest lipid panel from last year: LDL at goal, as are the rest of his lipid fractions: Lab Results  Component Value Date   CHOL 151 05/17/2021   HDL 50.90 05/17/2021    LDLCALC 90 05/17/2021   LDLDIRECT 139.2 11/21/2012   TRIG 49.0 05/17/2021   CHOLHDL 3 05/17/2021  -He continues on pravastatin 40 mg daily without side effects -We will check a lipid panel today  Component     Latest Ref Rng 03/27/2022  TSH     0.35 - 5.50 uIU/mL 0.71   Cholesterol     0 - 200 mg/dL 124   Triglycerides     0.0 - 149.0 mg/dL 47.0   HDL Cholesterol     >39.00 mg/dL 47.50   VLDL     0.0 - 40.0 mg/dL 9.4   LDL (calc)     0 - 99 mg/dL 67   Total CHOL/HDL Ratio 3   NonHDL 76.21   Hemoglobin A1C     4.0 - 5.6 % 5.8 !   Triiodothyronine,Free,Serum     2.3 - 4.2 pg/mL 2.5   T4,Free(Direct)     0.60 - 1.60 ng/dL 0.77     Labs are at goal.    Philemon Kingdom, MD PhD [facility] Endocrinology

## 2022-05-18 ENCOUNTER — Other Ambulatory Visit: Payer: Self-pay | Admitting: Family Medicine

## 2022-05-29 ENCOUNTER — Ambulatory Visit (HOSPITAL_BASED_OUTPATIENT_CLINIC_OR_DEPARTMENT_OTHER): Payer: Medicare HMO | Admitting: Cardiology

## 2022-05-29 ENCOUNTER — Encounter (HOSPITAL_BASED_OUTPATIENT_CLINIC_OR_DEPARTMENT_OTHER): Payer: Self-pay | Admitting: Cardiology

## 2022-05-29 VITALS — BP 112/76 | HR 93

## 2022-05-29 DIAGNOSIS — I480 Paroxysmal atrial fibrillation: Secondary | ICD-10-CM | POA: Diagnosis not present

## 2022-05-29 DIAGNOSIS — I10 Essential (primary) hypertension: Secondary | ICD-10-CM

## 2022-05-29 DIAGNOSIS — Z7901 Long term (current) use of anticoagulants: Secondary | ICD-10-CM | POA: Diagnosis not present

## 2022-05-29 DIAGNOSIS — D6869 Other thrombophilia: Secondary | ICD-10-CM

## 2022-05-29 DIAGNOSIS — R002 Palpitations: Secondary | ICD-10-CM | POA: Diagnosis not present

## 2022-05-29 NOTE — Progress Notes (Signed)
Cardiology Office Note:    Date:  05/29/2022   ID:  Gordon Phillips, DOB 30-Oct-1946, MRN [id number]  PCP:  Vivi Barrack, MD  Cardiologist:  Buford Dresser, MD PhD EP: Dr. Caryl Comes  Referring MD: Vivi Barrack, MD   CC: follow up  History of Present Illness:    Gordon Phillips is a 76 y.o. male with a hx of atrial flutter, MGUS, back pain who is seen for follow up of his atrial flutter.  Cardiac history: 08/12/18 presented with new onset atrial flutter (initially called as a CODE STEMI). During his hospitalization, he underwent TEE/CV with conversion to NSR. He was seen for EP follow up post hospitalization to discuss flutter ablation on 09/17/18, but he was found to be in rate controlled atrial fibrillation at the time. Dofetilide load vs. Ablation discussed. After seeing Dr. Caryl Comes /2020, plan was for ablation of the flutter, but this was put on hold due to coronavirus. He underwent flutter ablation 04/23/19 by Dr. Caryl Comes.  Today: Patient concerns today: dizziness while standing up, heart rate always feels fast (especially at night)--even though his watch says his HR is better.  Dizziness, ?orthostasis: started about 6 mos ago. Usually when he rises from a seated position when he has been sitting for a time, especially when he is a passenger in a car. No syncope, mild dizziness, lasts a very short period of time (about 30 seconds or less). No vertigo.  Fast heart rate at night: has been going on for some time. Got a smartwatch, his rates are actually not fast. He is in fib/atypical flutter today at 93 bpm, and he is surprised by this. He thought he was in rhythm. We discussed options, including cardioversion, but he would like to discuss options with Dr. Curt Bears before proceeding. He has had some fatigue but isn't sure if it is related. Last seen by Dr. Curt Bears 07/12/21. Was in SR at the time.  Was told to start celebrex for his back pain, but he is concerned about this. Back pain is his main  limitation while working. We discussed risk of blood thinners and celebrex. No recent hematuria or hematochezia. After discussion, he will try this and monitor for bleeding.  He denies any chest pain, shortness of breath, or peripheral edema. No headaches, syncope, orthopnea, or PND.  Past Medical History:  Diagnosis Date   ALLERGIC RHINITIS 01/25/2010   Arthritis    "neck, back, shoulders," (02/20/2017)   Chronic lower back pain    COVID 06/17/2021   has lingering cough that subsding   High cholesterol    Hx of cardiovascular stress test    a. [facility] 02/06/13:  EF 59%, no ischemia or scar   Hx of echocardiogram    a.  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   Vitiligo     Past Surgical History:  Procedure Laterality Date   A-FLUTTER ABLATION N/A 04/23/2019   Procedure: A-FLUTTER ABLATION;  Surgeon: Deboraha Sprang, MD;  Location: [facility];  Service: Cardiovascular;  Laterality: N/A;   ATRIAL FIBRILLATION ABLATION N/A 03/30/2021   Procedure: ATRIAL FIBRILLATION ABLATION;  Surgeon: Constance Haw, MD;  Location: [facility];  Service: Cardiovascular;  Laterality: N/A;   BIOPSY  09/29/2019   Procedure: BIOPSY;  Surgeon: Thornton Park, MD;  Location: [facility];  Service: Gastroenterology;;   CARDIOVERSION N/A 08/14/2018   Procedure: [id number];  Surgeon: [name], MD;  Location: [facility] ENDOSCOPY;  Service: Cardiovascular;  Laterality: N/A;   CARDIOVERSION N/A 01/04/2021   Procedure: CARDIOVERSION;  Surgeon: Pixie Casino, MD;  Location: [facility]  ENDOSCOPY;  Service: Cardiovascular;  Laterality: N/A;   ESOPHAGOGASTRODUODENOSCOPY N/A 09/29/2019   Procedure: ESOPHAGOGASTRODUODENOSCOPY (EGD);  Surgeon: Thornton Park, MD;  Location: [facility];  Service: Gastroenterology;  Laterality: N/A;   INGUINAL HERNIA REPAIR Bilateral    LUMBAR LAMINECTOMY     TEE WITHOUT CARDIOVERSION N/A 08/14/2018   Procedure: TRANSESOPHAGEAL ECHOCARDIOGRAM (TEE);  Surgeon: [name], MD;  Location: [facility] ENDOSCOPY;  Service: Cardiovascular;  Laterality: N/A;   TEE WITHOUT CARDIOVERSION N/A 03/30/2021   Procedure: TRANSESOPHAGEAL ECHOCARDIOGRAM (TEE);  Surgeon: Skeet Latch, MD;  Location: [facility] ENDOSCOPY;  Service: Cardiovascular;  Laterality: N/A;   UMBILICAL HERNIA REPAIR      Current Medications: Current Outpatient Medications on File Prior to Visit  Medication Sig   acetaminophen (TYLENOL) 650 MG CR tablet Take 1,300 mg by mouth daily as needed for pain.   Aloe-Sodium Chloride (AYR SALINE NASAL GEL NA) Place 1 application into the nose at bedtime as needed (congestion).   apixaban (ELIQUIS) 5 MG TABS tablet TAKE 1 TABLET BY MOUTH TWICE A DAY   diltiazem (CARDIZEM CD) 120 MG 24 hr capsule TAKE 1 CAPSULE BY MOUTH EVERY DAY   doxazosin (CARDURA) 2 MG tablet TAKE 1 TABLET BY MOUTH AT BEDTIME.   finasteride (PROSCAR) 5 MG tablet Take 5 mg by mouth daily.   glucose blood (ONETOUCH VERIO) test strip Use as instructed to check blood sugar once daily   hydrocortisone cream 1 % Apply 1 application topically 2 (two) times daily as needed for itching.   Lancets (ONETOUCH ULTRASOFT) lancets Use as instructed to check blood sugar once daily   losartan (COZAAR) 50 MG tablet TAKE 1 TABLET BY MOUTH EVERY DAY   metoprolol succinate (TOPROL-XL) 100 MG 24 hr tablet TAKE 1 TABLET (100 MG TOTAL) BY MOUTH AT BEDTIME. TAKE WITH OR IMMEDIATELY FOLLOWING A MEAL.   Misc Natural Products (PROSTATE THERAPY COMPLEX PO) Take 1 capsule by mouth in the morning. TruNature Prostate  Plus Health Complex   OVER THE COUNTER MEDICATION Take 1 capsule by mouth at bedtime. OTC - Super Beta Prostate   pravastatin (PRAVACHOL) 40 MG tablet TAKE 1 TABLET BY MOUTH EVERYDAY AT BEDTIME   PRESCRIPTION MEDICATION Inhale into the lungs at bedtime. CPAP   sildenafil (VIAGRA) 100 MG tablet Take 100 mg by mouth daily as needed for erectile dysfunction.    sodium chloride (OCEAN) 0.65 % nasal spray Place 1 spray into the nose as needed for congestion.   vitamin C (ASCORBIC ACID) 500 MG tablet Take 500 mg by mouth 3 (three) times a week.   No current facility-administered medications on file prior to visit.     Allergies:   Flomax [tamsulosin hcl], Relpax [eletriptan], and Other   Social History   Tobacco Use   Smoking status: Former    Packs/day: 0.50    Years: 8.00    Total pack years: 4.00    Types: Cigarettes  Quit date: 25    Years since quitting: 55.5   Smokeless tobacco: Never  Vaping Use   Vaping Use: Never used  Substance Use Topics   Alcohol use: Yes    Alcohol/week: 1.0 - 2.0 standard drink of alcohol    Types: 1 - 2 Shots of liquor per week    Comment: 02/20/2017 "couple drinks/month on average"   Drug use: No    Family History: The patient's family history includes Stroke in his father; Sudden death in his brother; Throat cancer in his brother; Thyroid disease in his sister. There is no history of Colon cancer, Esophageal cancer, Rectal cancer, or Stomach cancer.  ROS:   Please see the history of present illness.    Additional pertinent ROS negative except as noted.  EKGs/Labs/Other Studies Reviewed:    The following studies were reviewed today:  Left LE Venous Doppler  02/14/2022: Summary:  RIGHT:  - No evidence of common femoral vein obstruction.     LEFT:  - There is no evidence of deep vein thrombosis in the lower extremity.     - No cystic structure found in the popliteal fossa.   Ablation 03/30/21 1. Atrial flutter upon presentation.   2.  Successful electrical isolation and anatomical encircling of all four pulmonary veins with radiofrequency current.  A WACA approach was used 3. Additional left atrial ablation was performed with a standard box lesion created along the posterior wall of the left atrium 4.  Ablation from the right superior pulmonary vein to the mitral valve and from the left superior pulmonary vein to the mitral valve line with termination of atrial flutter 5. No early apparent complications.  TEE Endo 03/30/21  1. Left ventricular ejection fraction, by estimation, is 40 to 45%. The  left ventricle has mildly decreased function. The left ventricle  demonstrates global hypokinesis.   2. Right ventricular systolic function is moderately reduced. The right  ventricular size is normal.   3. Left atrial size was severely dilated. No left atrial/left atrial  appendage thrombus was detected. The LAA emptying velocity was 78 cm/s.   4. The mitral valve is normal in structure. Trivial mitral valve  regurgitation. No evidence of mitral stenosis.   5. Calcification and partial fusion of the left and non-coronary cusps.  The aortic valve is calcified. There is mild calcification of the aortic  valve. There is mild thickening of the aortic valve. Aortic valve  regurgitation is not visualized. No aortic   stenosis is present.   6. There is mild (Grade II) atheroma plaque involving the descending  aorta.   Conclusion(s)/Recommendation(s): Normal biventricular function without  evidence of hemodynamically significant valvular heart disease.   Echo 02/07/21 1. Left ventricular ejection fraction, by estimation, is 55 to 60%. The  left ventricle has normal function. The left ventricle has no regional  wall motion abnormalities. There is mild concentric left ventricular  hypertrophy of the basal-septal segment.  Left ventricular diastolic parameters are indeterminate.   2. Right ventricular systolic function is normal. The  right ventricular  size is normal. There is mildly elevated pulmonary artery systolic  pressure.   3. The mitral valve is grossly normal. Trivial mitral valve  regurgitation. No evidence of mitral stenosis.   4. The aortic valve is tricuspid. There is mild thickening of the aortic  valve. Aortic valve regurgitation is not visualized. No aortic stenosis is  present.   5. The inferior vena cava is normal in size with greater than 50%  respiratory variability, suggesting right atrial pressure of 3 mmHg.   Comparison(s): A prior study was performed on 08/13/2018. Prior images  reviewed side by side. Slight increase in aortic valve thickness. Study  done at lower rates from prior.   Lower Extremity DVT Study 08/14/19 Right: No evidence of common femoral vein obstruction. No cystic structure  found in the popliteal fossa.  Left: There is no evidence of deep vein thrombosis in the lower extremity.  No cystic structure found in the popliteal fossa.  Ablation 04/23/19 CONCLUSIONS:  1. Sinus rhythm upon presentation.  2. Atrial flutter present on arrival and after more extensive mapping, septal flutter 3. DCCV to restore sinus rhythm 4. Empiric cavotricuspid isthmus ablation was performed with complete bidirectional isthmus block achieved.   5. No early apparent complications.  TEE 08/14/18 Study Conclusions  - Left ventricle: The cavity size was normal. Wall thickness was   normal. Systolic function was mildly to moderately reduced. The   estimated ejection fraction was in the range of 40% to 45%. - Left atrium: The atrium was dilated. No evidence of thrombus in   the appendage. - Right ventricle: The cavity size was dilated. Wall thickness was   normal. - Right atrium: The atrium was dilated. No evidence of thrombus in   the atrial cavity or appendage. - Atrial septum: No defect or patent foramen ovale was identified.   Echo contrast study showed no right-to-left atrial level shunt,    following an increase in RA pressure induced by provocative   maneuvers. - Superior vena cava: The study excluded a thrombus.  Echo 08/13/18 - Left ventricle: The cavity size was normal. Wall thickness was   increased in a pattern of mild LVH. There was mild focal basal   hypertrophy of the septum. Systolic function was normal. The   estimated ejection fraction was in the range of 55% to 60%. Wall   motion was normal; there were no regional wall motion   abnormalities. The study is not technically sufficient to allow   evaluation of LV diastolic function. - Left atrium: The atrium was moderately dilated. - Pulmonary arteries: PA peak pressure: 34 mm Hg (S).   Impressions  - Normal LV function; mild LVH; moderate LAE; cannot R/O left   atrial mass; suggest TEE to further assess.  Lexiscan MPI 02/2017 Nuclear stress EF: 50%. There was no ST segment deviation noted during stress. The study is normal. This is a low risk study. The left ventricular ejection fraction is mildly decreased (45-54%).  EKG:  EKG is personally reviewed.  05/29/22:  atrial fibrillation at 93 bpm 01/10/22: not ordered 10/06/20: sinus rhythm with 1st degree AV block, HR 70 bpm  Recent Labs: 02/21/2022: ALT 12; BUN 16; Creatinine 1.08; Hemoglobin 13.2; Platelet Count 122; Potassium 4.2; Sodium 140 03/27/2022: TSH 0.71   Recent Lipid Panel    Component Value Date/Time   CHOL 124 03/27/2022 1432   TRIG 47.0 03/27/2022 1432   HDL 47.50 03/27/2022 1432   CHOLHDL 3 03/27/2022 1432   VLDL 9.4 03/27/2022 1432   LDLCALC 67 03/27/2022 1432   LDLDIRECT 139.2 11/21/2012 1122    Physical Exam:    VS:  BP 112/76 (BP Location: Right Arm, Patient Position: Sitting, Cuff Size: Large)   Pulse 93     Wt Readings from Last 3 Encounters:  03/27/22 275 lb 9.6 oz (125 kg)  03/20/22 271 lb 9.6 oz (123.2 kg)  02/13/22 281 lb (127.5 kg)    GEN: Well  nourished, well developed in no acute distress HEENT: Normal, moist mucous  membranes NECK: No JVD CARDIAC: irregularly irregular rhythm, normal S1 and S2, no rubs or gallops. No murmur. VASCULAR: Radial and DP pulses 2+ bilaterally. No carotid bruits RESPIRATORY:  Clear to auscultation without rales, wheezing or rhonchi  ABDOMEN: Soft, non-tender, non-distended MUSCULOSKELETAL:  Ambulates independently SKIN: Warm and dry, no edema NEUROLOGIC:  Alert and oriented x 3. No focal neuro deficits noted. PSYCHIATRIC:  Normal affect    ASSESSMENT:    1. Paroxysmal atrial fibrillation (HCC)   2. Heart palpitations   3. Secondary hypercoagulable state ([place])   4. Long term current use of anticoagulant   5. Essential hypertension    PLAN:    Palpitations atrial flutter, with paroxysmal atrial fibrillation:  -s/p flutter ablation -no recent hematuria -continue apixaban. Chadsvasc= at least 11 (old stroke seen on CT, age, hypertension) -on dual nodal agents with good control, as his atrial fibrillation goes very rapidly. Continue metoprolol and diltiazem. Rate controlled today -continue use of CPAP to treat OSA -discussed options today, including cardioversion. He would like to discuss with Dr. Curt Bears first. I am happy to arrange cardioversion if he elects to proceed  Hypertension: at goal today -continue losartan, diltiazem and metoprolol succinate  CV risk counseling and Secondary prevention counseling: -recommend heart healthy/Mediterranean diet, with whole grains, fruits, vegetable, fish, lean meats, nuts, and olive oil. Limit salt. -recommend moderate walking, 3-5 times/week for 30-50 minutes each session. Aim for at least 150 minutes.week. Goal should be pace of 3 miles/hours, or walking 1.5 miles in 30 minutes -recommend avoidance of tobacco products. Avoid excess alcohol. -continue pravastatin  Plan for follow up: 1 year or sooner PRN  Medication Adjustments/Labs and Tests Ordered: Current medicines are reviewed at length with the patient today.   Concerns regarding medicines are outlined above.   Orders Placed This Encounter  Procedures   EKG 12-Lead   No orders of the defined types were placed in this encounter.  Patient Instructions  Medication Instructions:  Your Physician recommend you continue on your current medication as directed.    *If you need a refill on your cardiac medications before your next appointment, please call your pharmacy*   Lab Work: None ordered today   Testing/Procedures: None ordered today   Follow-Up: At [facility], you and your health needs are our priority.  As part of our continuing mission to provide you with exceptional heart care, we have created designated Provider Care Teams.  These Care Teams include your primary Cardiologist (physician) and Advanced Practice Providers (APPs -  Physician Assistants and Nurse Practitioners) who all work together to provide you with the care you need, when you need it.  We recommend signing up for the patient portal called "MyChart".  Sign up information is provided on this After Visit Summary.  MyChart is used to connect with patients for Virtual Visits (Telemedicine).  Patients are able to view lab/test results, encounter notes, upcoming appointments, etc.  Non-urgent messages can be sent to your provider as well.   To learn more about what you can do with MyChart, go to [email or web address].    Your next appointment:   1 year(s)  The format for your next appointment:   In Person  Provider:   Buford Dresser, MD{           Note prepared by Madelin Rear, scribe.   Signed, Buford Dresser, MD PhD 05/29/2022  Gordon Phillips

## 2022-05-29 NOTE — Patient Instructions (Signed)

## 2022-07-11 ENCOUNTER — Encounter (HOSPITAL_BASED_OUTPATIENT_CLINIC_OR_DEPARTMENT_OTHER): Payer: Self-pay | Admitting: Cardiology

## 2022-07-14 NOTE — Progress Notes (Unsigned)
PCP:  Vivi Barrack, MD Primary Cardiologist: Buford Dresser, MD Electrophysiologist: Will Meredith Leeds, MD   Gordon Phillips is a 76 y.o. male seen today for Will Meredith Leeds, MD for routine electrophysiology followup.  Since last being seen in our clinic the patient reports doing OK until the around the time of his birthday this year.  Since then, he has had occasional tachypalpitations and DOE, especially on inclines. He was noted to be in AF in July at gen cards visit. Taking Eliquis without interruption.  Mild peripheral edema, worse in the evenings  Past Medical History:  Diagnosis Date   ALLERGIC RHINITIS 01/25/2010   Arthritis    "neck, back, shoulders," (02/20/2017)   Chronic lower back pain    COVID 06/17/2021   has lingering cough that subsding   High cholesterol    Hx of cardiovascular stress test    a. [place] 02/06/13:  EF 59%, no ischemia or scar   Hx of echocardiogram    a.  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   Vitiligo    Past Surgical History:  Procedure Laterality Date   A-FLUTTER ABLATION N/A 04/23/2019   Procedure: A-FLUTTER ABLATION;  Surgeon: Deboraha Sprang, MD;  Location: [place] [facility];  Service: Cardiovascular;  Laterality: N/A;   ATRIAL FIBRILLATION ABLATION N/A 03/30/2021   Procedure: ATRIAL FIBRILLATION ABLATION;  Surgeon: Constance Haw, MD;  Location: [place] [facility];  Service: Cardiovascular;  Laterality: N/A;   BIOPSY  09/29/2019   Procedure: BIOPSY;  Surgeon: Thornton Park, MD;  Location: [place];   Service: Gastroenterology;;   CARDIOVERSION N/A 08/14/2018   Procedure: [id number];  Surgeon: [name], MD;  Location: [facility] ENDOSCOPY;  Service: Cardiovascular;  Laterality: N/A;   CARDIOVERSION N/A 01/04/2021   Procedure: CARDIOVERSION;  Surgeon: Pixie Casino, MD;  Location: [facility] ENDOSCOPY;  Service: Cardiovascular;  Laterality: N/A;   ESOPHAGOGASTRODUODENOSCOPY N/A 09/29/2019   Procedure: ESOPHAGOGASTRODUODENOSCOPY (EGD);  Surgeon: Thornton Park, MD;  Location: [place];  Service: Gastroenterology;  Laterality: N/A;   INGUINAL HERNIA REPAIR Bilateral    LUMBAR LAMINECTOMY     TEE WITHOUT CARDIOVERSION N/A 08/14/2018   Procedure: TRANSESOPHAGEAL ECHOCARDIOGRAM (TEE);  Surgeon: [name], MD;  Location: [facility] ENDOSCOPY;  Service: Cardiovascular;  Laterality: N/A;   TEE WITHOUT CARDIOVERSION N/A 03/30/2021   Procedure: TRANSESOPHAGEAL ECHOCARDIOGRAM (TEE);  Surgeon: Skeet Latch, MD;  Location: [facility] ENDOSCOPY;  Service: Cardiovascular;  Laterality: N/A;   UMBILICAL HERNIA REPAIR      Current Outpatient Medications  Medication Sig Dispense Refill   acetaminophen (TYLENOL) 650 MG CR tablet Take 1,300 mg by mouth daily as needed for pain.     Aloe-Sodium Chloride (AYR SALINE NASAL GEL NA) Place 1 application into the nose at bedtime as needed (congestion).     apixaban (ELIQUIS) 5 MG TABS tablet TAKE 1 TABLET BY MOUTH TWICE A DAY 180 tablet 1   diltiazem (CARDIZEM CD) 120 MG 24 hr capsule TAKE 1 CAPSULE BY MOUTH EVERY DAY 90  capsule 3   doxazosin (CARDURA) 2 MG tablet TAKE 1 TABLET BY MOUTH AT BEDTIME. 90 tablet 2   finasteride (PROSCAR) 5 MG tablet Take 5 mg by mouth daily.     glucose blood (ONETOUCH VERIO) test strip Use as instructed to check blood sugar once daily 100 each 3   hydrocortisone cream 1 % Apply 1 application topically 2 (two) times daily as needed for itching.     Lancets (ONETOUCH ULTRASOFT) lancets Use as instructed to check blood sugar once daily 100 each 3    losartan (COZAAR) 50 MG tablet TAKE 1 TABLET BY MOUTH EVERY DAY 90 tablet 1   metoprolol succinate (TOPROL-XL) 100 MG 24 hr tablet TAKE 1 TABLET (100 MG TOTAL) BY MOUTH AT BEDTIME. TAKE WITH OR IMMEDIATELY FOLLOWING A MEAL. 90 tablet 3   Misc Natural Products (PROSTATE THERAPY COMPLEX PO) Take 1 capsule by mouth in the morning. TruNature Prostate Plus Health Complex     OVER THE COUNTER MEDICATION Take 1 capsule by mouth at bedtime. OTC - Super Beta Prostate     pravastatin (PRAVACHOL) 40 MG tablet TAKE 1 TABLET BY MOUTH EVERYDAY AT BEDTIME 90 tablet 3   PRESCRIPTION MEDICATION Inhale into the lungs at bedtime. CPAP     sildenafil (VIAGRA) 100 MG tablet Take 100 mg by mouth daily as needed for erectile dysfunction.      sodium chloride (OCEAN) 0.65 % nasal spray Place 1 spray into the nose as needed for congestion.     vitamin C (ASCORBIC ACID) 500 MG tablet Take 500 mg by mouth 3 (three) times a week.     No current facility-administered medications for this visit.    Allergies  Allergen Reactions   Flomax [Tamsulosin Hcl] Itching   Relpax [Eletriptan] Other (See Comments) and Hypertension    Chest pain and sweating   Other Palpitations    Any steroids cause heart palpitations  (tolerates low doses)    Social History   Socioeconomic History   Marital status: Married    Spouse name: Not on file   Number of children: 2   Years of education: Not on file   Highest education level: Not on file  Occupational History   Occupation: Press photographer   Occupation: retired  Tobacco Use   Smoking status: Former    Packs/day: 0.50    Years: 8.00    Total pack years: 4.00    Types: Cigarettes    Quit date: 1968    Years since quitting: 55.7   Smokeless tobacco: Never  Vaping Use   Vaping Use: Never used  Substance and Sexual Activity   Alcohol use: Yes    Alcohol/week: 1.0 - 2.0 standard drink of alcohol    Types: 1 - 2 Shots of liquor per week    Comment: 02/20/2017 "couple drinks/month on  average"   Drug use: No   Sexual activity: Yes  Other Topics Concern   Not on file  Social History Narrative   Not on file   Social Determinants of Health   Financial Resource Strain: Low Risk  (08/08/2021)   Overall Financial Resource Strain (CARDIA)    Difficulty of Paying Living Expenses: Not hard at all  Food Insecurity: No Food Insecurity (08/08/2021)   Hunger Vital Sign    Worried About Running Out of Food in the Last Year: Never true    [place] in the Last Year: Never true  Transportation Needs: No Transportation Needs (08/08/2021)   PRAPARE -  Hydrologist (Medical): No    Lack of Transportation (Non-Medical): No  Physical Activity: Inactive (08/08/2021)   Exercise Vital Sign    Days of Exercise per Week: 0 days    Minutes of Exercise per Session: 0 min  Stress: No Stress Concern Present (08/08/2021)   [place]    Feeling of Stress : Not at all  Social Connections: Moderately Isolated (08/08/2021)   Social Connection and Isolation Panel [NHANES]    Frequency of Communication with Friends and Family: More than three times a week    Frequency of Social Gatherings with Friends and Family: More than three times a week    Attends Religious Services: Never    Marine scientist or Organizations: No    Attends Archivist Meetings: Never    Marital Status: Married  Human resources officer Violence: Not At Risk (08/08/2021)   Humiliation, Afraid, Rape, and Kick questionnaire    Fear of Current or Ex-Partner: No    Emotionally Abused: No    Physically Abused: No    Sexually Abused: No     Review of Systems: All other systems reviewed and are otherwise negative except as noted above.  Physical Exam: Vitals:   07/17/22 1127  BP: 132/78  Pulse: 88  SpO2: 96%  Weight: 278 lb (126.1 kg)  Height: '6\' 2"'$  (1.88 m)    GEN- The patient is well appearing, alert and  oriented x 3 today.   HEENT: normocephalic, atraumatic; sclera clear, conjunctiva pink; hearing intact; oropharynx clear; neck supple, no JVP Lymph- no cervical lymphadenopathy Lungs- Clear to ausculation bilaterally, normal work of breathing.  No wheezes, rales, rhonchi Heart- Regular rate and rhythm, no murmurs, rubs or gallops, PMI not laterally displaced GI- soft, non-tender, non-distended, bowel sounds present, no hepatosplenomegaly Extremities- no clubbing or cyanosis. 1-2+ peripheral edema; DP/PT/radial pulses 2+ bilaterally MS- no significant deformity or atrophy Skin- warm and dry, no rash or lesion Psych- euthymic mood, full affect Neuro- strength and sensation are intact  EKG is ordered. Personal review of EKG from today shows atrial fibrillation at 88 bpm  Additional studies reviewed include: Previous EP office notes.   Assessment and Plan:  1. Persistent Atrial Fibrillation  2. Atrial flutter s/p CTI ablation by Dr. Caryl Comes  S/p PVI 03/2021 EKG today shows AF With symptoms of AF/diastolic CHf x 3 months.  Continue Eliquis for CHA2DS2VASC of at least 5  Update Echo 7 day Zio to see if persistent or paroxysmal Given risk factors, I do not think a [place] off AAD would be durable.  Will complete work up and have seen in AF clinic in 2-3 weeks to consider Tikosyn vs Amiodarone  2. HTN Stable on current regimen  Continue toprol 100 mg daily Continue losartan 50 mg daily  3. OSA  Encouraged nightly CPAP   4. Acute on chronic diastolic CHF Mild peripheral edema and DOE.  Give lasix 20 mg to take prn. Take dose tomorrow.   Follow up with  Atrial fibrillation clinic  in 2 weeks to discuss Tikosyn loading likely as bridge to re-do ablation if pt felt to be candidate.   Shirley Friar, PA-C  07/17/22 11:50 AM

## 2022-07-17 ENCOUNTER — Ambulatory Visit (INDEPENDENT_AMBULATORY_CARE_PROVIDER_SITE_OTHER): Payer: Medicare HMO

## 2022-07-17 ENCOUNTER — Encounter: Payer: Self-pay | Admitting: Student

## 2022-07-17 ENCOUNTER — Ambulatory Visit: Payer: Medicare HMO | Attending: Student | Admitting: Student

## 2022-07-17 VITALS — BP 132/78 | HR 88 | Ht 74.0 in | Wt 278.0 lb

## 2022-07-17 DIAGNOSIS — I10 Essential (primary) hypertension: Secondary | ICD-10-CM

## 2022-07-17 DIAGNOSIS — I4819 Other persistent atrial fibrillation: Secondary | ICD-10-CM

## 2022-07-17 DIAGNOSIS — G4730 Sleep apnea, unspecified: Secondary | ICD-10-CM

## 2022-07-17 MED ORDER — FUROSEMIDE 20 MG PO TABS: 20.0000 mg | ORAL_TABLET | Freq: Every day | ORAL | 3 refills | Status: DC | PRN

## 2022-07-17 NOTE — Patient Instructions (Addendum)
Medication Instructions:  Your physician has recommended you make the following change in your medication:   START: Furosemide '20mg'$  daily as needed for weight gain greater than 3lbs overnight or 5lbs in a week  *If you need a refill on your cardiac medications before your next appointment, please call your pharmacy*   Lab Work: TODAY: BMET  If you have labs (blood work) drawn today and your tests are completely normal, you will receive your results only by: [place] (if you have MyChart) OR A paper copy in the mail If you have any lab test that is abnormal or we need to change your treatment, we will call you to review the results.   Testing/Procedures: Your physician has requested that you have an echocardiogram. Echocardiography is a painless test that uses sound waves to create images of your heart. It provides your doctor with information about the size and shape of your heart and how well your heart's chambers and valves are working. This procedure takes approximately one hour. There are no restrictions for this procedure.   Follow-Up: At [facility], you and your health needs are our priority.  As part of our continuing mission to provide you with exceptional heart care, we have created designated Provider Care Teams.  These Care Teams include your primary Cardiologist (physician) and Advanced Practice Providers (APPs -  Physician Assistants and Nurse Practitioners) who all work together to provide you with the care you need, when you need it.   Your next appointment:   2 week(s)  The format for your next appointment:   In Person  Provider:   You will follow up in the [facility] located at [facility]. Your provider will be: Roderic Palau, NP or Clint R. Fenton, PA-C   Other Instructions  ZIO XT- Long Term Monitor Instructions  Your physician has requested you wear a ZIO patch monitor for 7 days.  This is a single patch monitor.  [employer] supplies one patch monitor per enrollment. Additional stickers are not available. Please do not apply patch if you will be having a Nuclear Stress Test,  Echocardiogram, Cardiac CT, MRI, or Chest Xray during the period you would be wearing the  monitor. The patch cannot be worn during these tests. You cannot remove and re-apply the  ZIO XT patch monitor.  Your ZIO patch monitor will be mailed 3 day USPS to your address on file. It may take 3-5 days  to receive your monitor after you have been enrolled.  Once you have received your monitor, please review the enclosed instructions. Your monitor  has already been registered assigning a specific monitor serial # to you.  Billing and Patient Assistance Program Information  We have supplied [employer] with any of your insurance information on file for billing purposes. [employer] offers a sliding scale Patient Assistance Program for patients that do not have  insurance, or whose insurance does not completely cover the cost of the ZIO monitor.  You must apply for the Patient Assistance Program to qualify for this discounted rate.  To apply, please call [employer] at [phone], select option 4, select option 2, ask to apply for  Patient Assistance Program. Theodore Demark will ask your household income, and how many people  are in your household. They will quote your out-of-pocket cost based on that information.  [employer] will also be able to set up a 62-month interest-free payment plan if needed.  Applying the monitor   Shave hair from upper left  chest.  Hold abrader disc by orange tab. Rub abrader in 40 strokes over the upper left chest as  indicated in your monitor instructions.  Clean area with 4 enclosed alcohol pads. Let dry.  Apply patch as indicated in monitor instructions. Patch will be placed under collarbone on left  side of chest with arrow pointing upward.  Rub patch adhesive wings for 2 minutes. Remove white label marked "1". Remove the  white  label marked "2". Rub patch adhesive wings for 2 additional minutes.  While looking in a mirror, press and release button in center of patch. A small green light will  flash 3-4 times. This will be your only indicator that the monitor has been turned on.  Do not shower for the first 24 hours. You may shower after the first 24 hours.  Press the button if you feel a symptom. You will hear a small click. Record Date, Time and  Symptom in the Patient Logbook.  When you are ready to remove the patch, follow instructions on the last 2 pages of Patient  Logbook. Stick patch monitor onto the last page of Patient Logbook.  Place Patient Logbook in the blue and white box. Use locking tab on box and tape box closed  securely. The blue and white box has prepaid postage on it. Please place it in the mailbox as  soon as possible. Your physician should have your test results approximately 7 days after the  monitor has been mailed back to [facility].  Call [employer] at [phone] if you have questions regarding  your ZIO XT patch monitor. Call them immediately if you see an orange light blinking on your  monitor.  If your monitor falls off in less than 4 days, contact our Monitor department at [phone].  If your monitor becomes loose or falls off after 4 days call [employer] at [phone] for  suggestions on securing your monitor

## 2022-07-17 NOTE — Progress Notes (Unsigned)
Enrolled for [employer] to mail a ZIO XT long term holter monitor to the patients address on file.   Dr. Camnitz to read. 

## 2022-07-18 ENCOUNTER — Other Ambulatory Visit: Payer: Self-pay | Admitting: Cardiology

## 2022-07-18 ENCOUNTER — Other Ambulatory Visit: Payer: Self-pay | Admitting: Family Medicine

## 2022-07-18 LAB — BASIC METABOLIC PANEL
BUN/Creatinine Ratio: 14 (ref 10–24)
BUN: 12 mg/dL (ref 8–27)
CO2: 22 mmol/L (ref 20–29)
Calcium: 9.3 mg/dL (ref 8.6–10.2)
Chloride: 107 mmol/L — ABNORMAL HIGH (ref 96–106)
Creatinine, Ser: 0.88 mg/dL (ref 0.76–1.27)
Glucose: 108 mg/dL — ABNORMAL HIGH (ref 70–99)
Potassium: 4.2 mmol/L (ref 3.5–5.2)
Sodium: 144 mmol/L (ref 134–144)
eGFR: 90 mL/min/{1.73_m2} (ref >59)

## 2022-07-18 NOTE — Telephone Encounter (Signed)
Prescription refill request for Eliquis received. Indication:Afib Last office visit:9/23 Scr:0.8 Age: 76 Weight:126.1 kg  Prescription refilled

## 2022-07-20 DIAGNOSIS — I4819 Other persistent atrial fibrillation: Secondary | ICD-10-CM

## 2022-07-31 ENCOUNTER — Encounter: Payer: Self-pay | Admitting: *Deleted

## 2022-07-31 ENCOUNTER — Ambulatory Visit (HOSPITAL_COMMUNITY): Payer: Medicare HMO | Admitting: Physician Assistant

## 2022-07-31 ENCOUNTER — Ambulatory Visit (HOSPITAL_COMMUNITY): Payer: Medicare HMO | Attending: Student

## 2022-07-31 DIAGNOSIS — I4819 Other persistent atrial fibrillation: Secondary | ICD-10-CM | POA: Insufficient documentation

## 2022-07-31 LAB — ECHOCARDIOGRAM COMPLETE
Area-P 1/2: 4.51 cm2
S' Lateral: 2.7 cm

## 2022-08-15 ENCOUNTER — Other Ambulatory Visit: Payer: Self-pay | Admitting: Internal Medicine

## 2022-08-15 ENCOUNTER — Ambulatory Visit (HOSPITAL_COMMUNITY): Payer: Medicare HMO | Admitting: Physician Assistant

## 2022-08-15 DIAGNOSIS — R7303 Prediabetes: Secondary | ICD-10-CM

## 2022-08-21 ENCOUNTER — Ambulatory Visit (INDEPENDENT_AMBULATORY_CARE_PROVIDER_SITE_OTHER): Payer: Medicare HMO

## 2022-08-21 VITALS — Wt 278.0 lb

## 2022-08-21 DIAGNOSIS — Z0000 Encounter for general adult medical examination without abnormal findings: Secondary | ICD-10-CM

## 2022-08-21 NOTE — Patient Instructions (Signed)
Mr. Gordon Phillips , Thank you for taking time to come for your Medicare Wellness Visit. I appreciate your ongoing commitment to your health goals. Please review the following plan we discussed and let me know if I can assist you in the future.   These are the goals we discussed:  Goals      DIET - REDUCE PORTION SIZE     Patient would like to change eating habits and decrease meal size at dinner     Increase physical activity     Would like to gradually add exercise into daily routine      Patient Stated     Lose weight     Patient Stated     Lose weight      Patient Stated     Lose weight and better eating         This is a list of the screening recommended for you and due dates:  Health Maintenance  Topic Date Due   COVID-19 Vaccine (5 - Pfizer risk series) 01/16/2022   Flu Shot  06/06/2022   Colon Cancer Screening  05/15/2023   Tetanus Vaccine  12/14/2024   Pneumonia Vaccine  Completed   Hepatitis C Screening: USPSTF Recommendation to screen - Ages 18-79 yo.  Completed   Zoster (Shingles) Vaccine  Completed   HPV Vaccine  Aged Out    Advanced directives: Please bring a copy of your health care power of attorney and living will to the office at your convenience.  Conditions/risks identified: lose weight and eating better   Next appointment: Follow up in one year for your annual wellness visit.   Preventive Care 75 Years and Older, Male  Preventive care refers to lifestyle choices and visits with your health care provider that can promote health and wellness. What does preventive care include? A yearly physical exam. This is also called an annual well check. Dental exams once or twice a year. Routine eye exams. Ask your health care provider how often you should have your eyes checked. Personal lifestyle choices, including: Daily care of your teeth and gums. Regular physical activity. Eating a healthy diet. Avoiding tobacco and drug use. Limiting alcohol  use. Practicing safe sex. Taking low doses of aspirin every day. Taking vitamin and mineral supplements as recommended by your health care provider. What happens during an annual well check? The services and screenings done by your health care provider during your annual well check will depend on your age, overall health, lifestyle risk factors, and family history of disease. Counseling  Your health care provider may ask you questions about your: Alcohol use. Tobacco use. Drug use. Emotional well-being. Home and relationship well-being. Sexual activity. Eating habits. History of falls. Memory and ability to understand (cognition). Work and work Statistician. Screening  You may have the following tests or measurements: Height, weight, and BMI. Blood pressure. Lipid and cholesterol levels. These may be checked every 5 years, or more frequently if you are over 29 years old. Skin check. Lung cancer screening. You may have this screening every year starting at age 75 if you have a 30-pack-year history of smoking and currently smoke or have quit within the past 15 years. Fecal occult blood test (FOBT) of the stool. You may have this test every year starting at age 37. Flexible sigmoidoscopy or colonoscopy. You may have a sigmoidoscopy every 5 years or a colonoscopy every 10 years starting at age 73. Prostate cancer screening. Recommendations will vary depending on your family history  and other risks. Hepatitis C blood test. Hepatitis B blood test. Sexually transmitted disease (STD) testing. Diabetes screening. This is done by checking your blood sugar (glucose) after you have not eaten for a while (fasting). You may have this done every 1-3 years. Abdominal aortic aneurysm (AAA) screening. You may need this if you are a current or former smoker. Osteoporosis. You may be screened starting at age 75 if you are at high risk. Talk with your health care provider about your test results,  treatment options, and if necessary, the need for more tests. Vaccines  Your health care provider may recommend certain vaccines, such as: Influenza vaccine. This is recommended every year. Tetanus, diphtheria, and acellular pertussis (Tdap, Td) vaccine. You may need a Td booster every 10 years. Zoster vaccine. You may need this after age 75. Pneumococcal 13-valent conjugate (PCV13) vaccine. One dose is recommended after age 75. Pneumococcal polysaccharide (PPSV23) vaccine. One dose is recommended after age 75. Talk to your health care provider about which screenings and vaccines you need and how often you need them. This information is not intended to replace advice given to you by your health care provider. Make sure you discuss any questions you have with your health care provider. Document Released: 11/19/2015 Document Revised: 07/12/2016 Document Reviewed: 08/24/2015 Elsevier Interactive Patient Education  2017 [place] Prevention in the Home Falls can cause injuries. They can happen to people of all ages. There are many things you can do to make your home safe and to help prevent falls. What can I do on the outside of my home? Regularly fix the edges of walkways and driveways and fix any cracks. Remove anything that might make you trip as you walk through a door, such as a raised step or threshold. Trim any bushes or trees on the path to your home. Use bright outdoor lighting. Clear any walking paths of anything that might make someone trip, such as rocks or tools. Regularly check to see if handrails are loose or broken. Make sure that both sides of any steps have handrails. Any raised decks and porches should have guardrails on the edges. Have any leaves, snow, or ice cleared regularly. Use sand or salt on walking paths during winter. Clean up any spills in your garage right away. This includes oil or grease spills. What can I do in the bathroom? Use night  lights. Install grab bars by the toilet and in the tub and shower. Do not use towel bars as grab bars. Use non-skid mats or decals in the tub or shower. If you need to sit down in the shower, use a plastic, non-slip stool. Keep the floor dry. Clean up any water that spills on the floor as soon as it happens. Remove soap buildup in the tub or shower regularly. Attach bath mats securely with double-sided non-slip rug tape. Do not have throw rugs and other things on the floor that can make you trip. What can I do in the bedroom? Use night lights. Make sure that you have a light by your bed that is easy to reach. Do not use any sheets or blankets that are too big for your bed. They should not hang down onto the floor. Have a firm chair that has side arms. You can use this for support while you get dressed. Do not have throw rugs and other things on the floor that can make you trip. What can I do in the kitchen? Clean up any spills  right away. Avoid walking on wet floors. Keep items that you use a lot in easy-to-reach places. If you need to reach something above you, use a strong step stool that has a grab bar. Keep electrical cords out of the way. Do not use floor polish or wax that makes floors slippery. If you must use wax, use non-skid floor wax. Do not have throw rugs and other things on the floor that can make you trip. What can I do with my stairs? Do not leave any items on the stairs. Make sure that there are handrails on both sides of the stairs and use them. Fix handrails that are broken or loose. Make sure that handrails are as long as the stairways. Check any carpeting to make sure that it is firmly attached to the stairs. Fix any carpet that is loose or worn. Avoid having throw rugs at the top or bottom of the stairs. If you do have throw rugs, attach them to the floor with carpet tape. Make sure that you have a light switch at the top of the stairs and the bottom of the stairs. If  you do not have them, ask someone to add them for you. What else can I do to help prevent falls? Wear shoes that: Do not have high heels. Have rubber bottoms. Are comfortable and fit you well. Are closed at the toe. Do not wear sandals. If you use a stepladder: Make sure that it is fully opened. Do not climb a closed stepladder. Make sure that both sides of the stepladder are locked into place. Ask someone to hold it for you, if possible. Clearly mark and make sure that you can see: Any grab bars or handrails. First and last steps. Where the edge of each step is. Use tools that help you move around (mobility aids) if they are needed. These include: Canes. Walkers. Scooters. Crutches. Turn on the lights when you go into a dark area. Replace any light bulbs as soon as they burn out. Set up your furniture so you have a clear path. Avoid moving your furniture around. If any of your floors are uneven, fix them. If there are any pets around you, be aware of where they are. Review your medicines with your doctor. Some medicines can make you feel dizzy. This can increase your chance of falling. Ask your doctor what other things that you can do to help prevent falls. This information is not intended to replace advice given to you by your health care provider. Make sure you discuss any questions you have with your health care provider. Document Released: 08/19/2009 Document Revised: 03/30/2016 Document Reviewed: 11/27/2014 Elsevier Interactive Patient Education  2017 [employer].

## 2022-08-21 NOTE — Progress Notes (Signed)
I connected with  Javed Cotto on 08/21/22 by a audio enabled telemedicine application and verified that I am speaking with the correct person using two identifiers.  Patient Location: Home  Provider Location: Office/Clinic  I discussed the limitations of evaluation and management by telemedicine. The patient expressed understanding and agreed to proceed.   Subjective:   Gordon Phillips is a 76 y.o. male who presents for Medicare Annual/Subsequent preventive examination.  Review of Systems     Cardiac Risk Factors include: advanced age (>42mn, >>21women)     Objective:    Today's Vitals   08/21/22 1303  Weight: 278 lb (126.1 kg)   Body mass index is 35.69 kg/m.     08/21/2022    1:09 PM 08/08/2021   12:00 PM 01/04/2021    7:32 AM 12/21/2020    7:30 PM 07/29/2020   11:58 AM 09/28/2019    4:45 PM 09/26/2019    7:24 AM  Advanced Directives  Does Patient Have a Medical Advance Directive? Yes No No No No No No  Type of AParamedicof AMaltaLiving will        Does patient want to make changes to medical advance directive?     Yes (MAU/Ambulatory/Procedural Areas - Information given)    Copy of [place] Chart? No - copy requested        Would patient like information on creating a medical advance directive?  Yes (MAU/Ambulatory/Procedural Areas - Information given) No - Patient declined No - Patient declined   No - Patient declined    Current Medications (verified) Outpatient Encounter Medications as of 08/21/2022  Medication Sig   acetaminophen (TYLENOL) 650 MG CR tablet Take 1,300 mg by mouth daily as needed for pain.   Aloe-Sodium Chloride (AYR SALINE NASAL GEL NA) Place 1 application into the nose at bedtime as needed (congestion).   diltiazem (CARDIZEM CD) 120 MG 24 hr capsule TAKE 1 CAPSULE BY MOUTH EVERY DAY   doxazosin (CARDURA) 2 MG tablet TAKE 1 TABLET BY MOUTH AT BEDTIME.   ELIQUIS 5 MG TABS tablet TAKE 1 TABLET BY MOUTH  TWICE A DAY   finasteride (PROSCAR) 5 MG tablet Take 5 mg by mouth daily.   furosemide (LASIX) 20 MG tablet Take 1 tablet (20 mg total) by mouth daily as needed.   hydrocortisone cream 1 % Apply 1 application topically 2 (two) times daily as needed for itching.   Lancets (ONETOUCH ULTRASOFT) lancets Use as instructed to check blood sugar once daily   losartan (COZAAR) 50 MG tablet TAKE 1 TABLET BY MOUTH EVERY DAY   methocarbamol (ROBAXIN) 500 MG tablet SMARTSIG:1-2 Tablet(s) By Mouth 2-3 Times Daily PRN   metoprolol succinate (TOPROL-XL) 100 MG 24 hr tablet TAKE 1 TABLET (100 MG TOTAL) BY MOUTH AT BEDTIME. TAKE WITH OR IMMEDIATELY FOLLOWING A MEAL.   Misc Natural Products (PROSTATE THERAPY COMPLEX PO) Take 1 capsule by mouth in the morning. TruNature Prostate Plus Health Complex   ONETOUCH VERIO test strip USE AS INSTRUCTED TO CHECK BLOOD SUGAR ONCE DAILY   OVER THE COUNTER MEDICATION Take 1 capsule by mouth at bedtime. OTC - Super Beta Prostate   pravastatin (PRAVACHOL) 40 MG tablet TAKE 1 TABLET BY MOUTH EVERYDAY AT BEDTIME   PRESCRIPTION MEDICATION Inhale into the lungs at bedtime. CPAP   sildenafil (VIAGRA) 100 MG tablet Take 100 mg by mouth daily as needed for erectile dysfunction.    sodium chloride (OCEAN) 0.65 % nasal spray Place  1 spray into the nose as needed for congestion.   vitamin C (ASCORBIC ACID) 500 MG tablet Take 500 mg by mouth 3 (three) times a week.   No facility-administered encounter medications on file as of 08/21/2022.    Allergies (verified) Flomax [tamsulosin hcl], Relpax [eletriptan], and Other   History: Past Medical History:  Diagnosis Date   ALLERGIC RHINITIS 01/25/2010   Arthritis    "neck, back, shoulders," (02/20/2017)   Chronic lower back pain    COVID 06/17/2021   has lingering cough that subsding   High cholesterol    Hx of cardiovascular stress test    a. [place] 02/06/13:  EF 59%, no ischemia or scar   Hx of echocardiogram    a.  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   Vitiligo    Past Surgical History:  Procedure Laterality Date   A-FLUTTER ABLATION N/A 04/23/2019   Procedure: A-FLUTTER ABLATION;  Surgeon: Deboraha Sprang, MD;  Location: [place] [facility];  Service: Cardiovascular;  Laterality: N/A;   ATRIAL FIBRILLATION ABLATION N/A 03/30/2021   Procedure: ATRIAL FIBRILLATION ABLATION;  Surgeon: Constance Haw, MD;  Location: [place] [facility];  Service: Cardiovascular;  Laterality: N/A;   BIOPSY  09/29/2019   Procedure: BIOPSY;  Surgeon: Thornton Park, MD;  Location: [place];  Service: Gastroenterology;;   CARDIOVERSION N/A 08/14/2018   Procedure: [id number];  Surgeon: [name], MD;  Location: [facility] ENDOSCOPY;  Service: Cardiovascular;  Laterality: N/A;   CARDIOVERSION N/A 01/04/2021   Procedure: CARDIOVERSION;  Surgeon: Pixie Casino, MD;  Location: [facility] ENDOSCOPY;  Service: Cardiovascular;  Laterality: N/A;   ESOPHAGOGASTRODUODENOSCOPY N/A 09/29/2019   Procedure: ESOPHAGOGASTRODUODENOSCOPY (EGD);  Surgeon: Thornton Park, MD;  Location: [place];  Service: Gastroenterology;  Laterality: N/A;   INGUINAL HERNIA REPAIR Bilateral    LUMBAR LAMINECTOMY     TEE WITHOUT CARDIOVERSION N/A 08/14/2018   Procedure: TRANSESOPHAGEAL ECHOCARDIOGRAM (TEE);  Surgeon: [name], MD;  Location: [facility] ENDOSCOPY;  Service: Cardiovascular;  Laterality: N/A;   TEE WITHOUT CARDIOVERSION N/A 03/30/2021   Procedure: TRANSESOPHAGEAL ECHOCARDIOGRAM (TEE);  Surgeon: Skeet Latch, MD;  Location: [facility] ENDOSCOPY;   Service: Cardiovascular;  Laterality: N/A;   UMBILICAL HERNIA REPAIR     Family History  Problem Relation Age of Onset   Stroke Father        Passed away in his 72s   Thyroid disease Sister        goiter   Sudden death Brother        ? Drug use, no autopsy   Throat cancer Brother    Colon cancer Neg Hx    Esophageal cancer Neg Hx    Rectal cancer Neg Hx    Stomach cancer Neg Hx    Social History   Socioeconomic History   Marital status: Married    Spouse name: Not on file   Number of children: 2   Years of education: Not on file   Highest education level: Not on file  Occupational History   Occupation: Press photographer   Occupation: retired  Tobacco Use   Smoking status: Former    Packs/day: 0.50    Years: 8.00    Total pack years: 4.00    Types: Cigarettes    Quit date: 1968    Years since quitting: 55.8   Smokeless tobacco: Never  Vaping Use   Vaping Use: Never used  Substance and Sexual Activity   Alcohol use: Yes    Alcohol/week: 1.0 - 2.0 standard drink of alcohol    Types: 1 - 2 Shots of liquor per week    Comment: 02/20/2017 "couple drinks/month on average"   Drug use: No   Sexual activity: Yes  Other Topics Concern   Not on file  Social History Narrative   Not on file   Social Determinants of Health   Financial Resource Strain: Low Risk  (08/21/2022)   Overall Financial Resource Strain (CARDIA)    Difficulty of Paying Living Expenses: Not hard at all  Food Insecurity: No Food Insecurity (08/21/2022)   Hunger Vital Sign    Worried About Running Out of Food in the Last Year: Never true    Ran Out of Food in the Last Year: Never true  Transportation Needs: No Transportation Needs (08/21/2022)   PRAPARE - Hydrologist (Medical): No    Lack of Transportation (Non-Medical): No  Physical Activity: Inactive (08/21/2022)   Exercise Vital Sign    Days of Exercise per Week: 0 days    Minutes of Exercise per Session: 0 min  Stress: No  Stress Concern Present (08/21/2022)   [place]    Feeling of Stress : Not at all  Social Connections: Moderately Isolated (08/21/2022)   Social Connection and Isolation Panel [NHANES]    Frequency of Communication with Friends and Family: More than three times a week    Frequency of Social Gatherings with Friends and Family: More than three times a week    Attends Religious Services: Never    Marine scientist or Organizations: No    Attends Music therapist: Never    Marital Status: Married    Tobacco Counseling Counseling given: Not Answered   Clinical Intake:  Pre-visit preparation completed: Yes  Pain : No/denies pain     BMI - recorded: 35.69 Nutritional Status: BMI > 30  Obese Nutritional Risks: None Diabetes: No  How often do you need to have someone help you when you read instructions, pamphlets, or other written materials from your doctor or pharmacy?: 1 - Never  Diabetic?no  Interpreter Needed?: No  Information entered by :: Charlott Rakes, LPN   Activities of Daily Living    08/21/2022    1:09 PM  In your present state of health, do you have any difficulty performing the following activities:  Hearing? 0  Vision? 0  Difficulty concentrating or making decisions? 0  Walking or climbing stairs? 1  Comment pain associated with it due to back issues  Dressing or bathing? 0  Doing errands, shopping? 0  Preparing Food and eating ? N  Using the Toilet? N  In the past six months, have you accidently leaked urine? N  Do you have problems with loss of bowel control? N  Managing your Medications? N  Managing your Finances? N  Housekeeping or managing your Housekeeping? N    Patient Care Team: Vivi Barrack, MD as PCP - General (Family Medicine)  Buford Dresser, MD as PCP - Cardiology (Cardiology) Constance Haw, MD as PCP - Electrophysiology  (Cardiology) Truitt Merle, MD as Consulting Physician (Hematology) [facility] as Consulting Physician (Ophthalmology)  Indicate any recent Medical Services you may have received from other than [facility] providers in the past year (date may be approximate).     Assessment:   This is a routine wellness examination for [facility]  Hearing/Vision screen Hearing Screening - Comments:: Pt denies any hearing issues  Vision Screening - Comments:: Pt follows up with Dr Katy Fitch for annual eye exams   Dietary issues and exercise activities discussed: Current Exercise Habits: The patient has a physically strenuous job, but has no regular exercise apart from work.   Goals Addressed             This Visit's Progress    Patient Stated       Lose weight and better eating        Depression Screen    08/21/2022    1:07 PM 03/20/2022    1:11 PM 09/13/2021    1:22 PM 08/08/2021   11:59 AM 07/29/2020   11:56 AM 08/26/2019    2:25 PM 08/20/2018    1:26 PM  PHQ 2/9 Scores  PHQ - 2 Score 0 0 0 0 0 0 0  PHQ- 9 Score      3     Fall Risk    08/21/2022    1:09 PM 03/20/2022    1:11 PM 09/13/2021    1:22 PM 08/08/2021   12:01 PM 07/29/2020   12:01 PM  [place] in the past year? 0 0 0 0 0  Number falls in past yr: 0 0 0 0 0  Injury with Fall? 0 0 0 0 0  Risk for fall due to : Impaired vision No Fall Risks No Fall Risks Impaired vision Impaired vision  Follow up Falls prevention discussed   Falls prevention discussed Falls prevention discussed    FALL RISK PREVENTION PERTAINING TO THE HOME:  Any stairs in or around the home? Yes  If so, are there any without handrails? No  Home free of loose throw rugs in walkways, pet beds, electrical cords, etc? Yes  Adequate lighting in your home to reduce risk of falls? Yes   ASSISTIVE DEVICES UTILIZED TO PREVENT FALLS:  Life alert? No  Use of a cane, walker or w/c? No  Grab bars in the bathroom? No  Shower chair or bench in  shower? No  Elevated toilet seat or a handicapped toilet? No   TIMED UP AND GO:  Was the test performed? No .    Cognitive Function:        08/21/2022    1:10 PM 08/08/2021   12:02 PM 07/29/2020   12:08 PM 07/03/2019    1:43 PM  6CIT Screen  What Year? 0 points 0 points 0 points 0 points  What month? 0 points 0 points 0 points 0 points  What time? 0 points 0 points  0 points  Count back from 20 0 points 0 points 0 points 0 points  Months in reverse 0 points 0 points 0 points 0 points  Repeat phrase 0 points 0 points 2 points 0 points  Total Score 0 points 0 points  0 points    Immunizations Immunization History  Administered Date(s) Administered   Fluad Quad(high Dose 65+) 08/26/2019, 09/07/2020, 09/13/2021   Influenza Split 08/10/2011, 08/21/2012   Influenza, High  Dose Seasonal PF 08/24/2017   Influenza,inj,Quad PF,6+ Mos 08/07/2013, 08/10/2014, 08/12/2018   Influenza-Unspecified 06/20/2016   PFIZER(Purple Top)SARS-COV-2 Vaccination 12/18/2019, 01/12/2020, 10/14/2020   Pfizer Covid-19 Vaccine Bivalent Booster 5yr & up 11/21/2021   Pneumococcal Conjugate-13 08/10/2014   Pneumococcal Polysaccharide-23 03/20/2016   Tdap 12/14/2014   Zoster Recombinat (Shingrix) 08/13/2021, 11/21/2021    TDAP status: Up to date  Flu Vaccine status: Up to date  Pneumococcal vaccine status: Up to date  Covid-19 vaccine status: Completed vaccines  Qualifies for Shingles Vaccine? Yes   Zostavax completed Yes   Shingrix Completed?: Yes  Screening Tests Health Maintenance  Topic Date Due   COVID-19 Vaccine (5 - Pfizer risk series) 01/16/2022   INFLUENZA VACCINE  06/06/2022   COLONOSCOPY (Pts 45-461yrInsurance coverage will need to be confirmed)  05/15/2023   TETANUS/TDAP  12/14/2024   Pneumonia Vaccine 6550Years old  Completed   Hepatitis C Screening  Completed   Zoster Vaccines- Shingrix  Completed   HPV VACCINES  Aged Out    Health Maintenance  Health Maintenance Due   Topic Date Due   COVID-19 Vaccine (5 - Pfizer risk series) 01/16/2022   INFLUENZA VACCINE  06/06/2022    Colorectal cancer screening: Type of screening: Colonoscopy. Completed 05/14/18. Repeat every 5 years   Additional Screening:  Hepatitis C Screening:  Completed 12/14/14  Vision Screening: Recommended annual ophthalmology exams for early detection of glaucoma and other disorders of the eye. Is the patient up to date with their annual eye exam?  Yes  Who is the provider or what is the name of the office in which the patient attends annual eye exams? Dr GrKaty FitchIf pt is not established with a provider, would they like to be referred to a provider to establish care? No .   Dental Screening: Recommended annual dental exams for proper oral hygiene  Community Resource Referral / Chronic Care Management: CRR required this visit?  No   CCM required this visit?  No      Plan:     I have personally reviewed and noted the following in the patient's chart:   Medical and social history Use of alcohol, tobacco or illicit drugs  Current medications and supplements including opioid prescriptions. Patient is not currently taking opioid prescriptions. Functional ability and status Nutritional status Physical activity Advanced directives List of other physicians Hospitalizations, surgeries, and ER visits in previous 12 months Vitals Screenings to include cognitive, depression, and falls Referrals and appointments  In addition, I have reviewed and discussed with patient certain preventive protocols, quality metrics, and best practice recommendations. A written personalized care plan for preventive services as well as general preventive health recommendations were provided to patient.     TiWillette BraceLPN   [id number] Nurse Notes: none

## 2022-08-22 ENCOUNTER — Encounter (HOSPITAL_COMMUNITY): Payer: Self-pay | Admitting: Physician Assistant

## 2022-08-22 ENCOUNTER — Ambulatory Visit (HOSPITAL_COMMUNITY)
Admission: RE | Admit: 2022-08-22 | Discharge: 2022-08-22 | Disposition: A | Discharge status: Home / Self Care | Payer: Medicare HMO | Source: Ambulatory Visit | Attending: Physician Assistant | Admitting: Physician Assistant

## 2022-08-22 VITALS — BP 106/68 | HR 85 | Ht 74.0 in | Wt 277.4 lb

## 2022-08-22 DIAGNOSIS — I4892 Unspecified atrial flutter: Secondary | ICD-10-CM | POA: Diagnosis not present

## 2022-08-22 DIAGNOSIS — Z7901 Long term (current) use of anticoagulants: Secondary | ICD-10-CM | POA: Diagnosis not present

## 2022-08-22 DIAGNOSIS — G43909 Migraine, unspecified, not intractable, without status migrainosus: Secondary | ICD-10-CM | POA: Diagnosis not present

## 2022-08-22 DIAGNOSIS — I10 Essential (primary) hypertension: Secondary | ICD-10-CM | POA: Diagnosis not present

## 2022-08-22 DIAGNOSIS — L80 Vitiligo: Secondary | ICD-10-CM | POA: Insufficient documentation

## 2022-08-22 DIAGNOSIS — I4819 Other persistent atrial fibrillation: Secondary | ICD-10-CM | POA: Diagnosis present

## 2022-08-22 DIAGNOSIS — I319 Disease of pericardium, unspecified: Secondary | ICD-10-CM | POA: Insufficient documentation

## 2022-08-22 DIAGNOSIS — D6869 Other thrombophilia: Secondary | ICD-10-CM | POA: Diagnosis not present

## 2022-08-22 DIAGNOSIS — Z6835 Body mass index (BMI) 35.0-35.9, adult: Secondary | ICD-10-CM | POA: Insufficient documentation

## 2022-08-22 DIAGNOSIS — I639 Cerebral infarction, unspecified: Secondary | ICD-10-CM | POA: Diagnosis not present

## 2022-08-22 DIAGNOSIS — E669 Obesity, unspecified: Secondary | ICD-10-CM | POA: Insufficient documentation

## 2022-08-22 DIAGNOSIS — G4733 Obstructive sleep apnea (adult) (pediatric): Secondary | ICD-10-CM | POA: Insufficient documentation

## 2022-08-22 NOTE — Progress Notes (Signed)
Primary Care Physician: Vivi Barrack, MD Primary Cardiologist: Dr Harrell Gave  Primary Electrophysiologist: Dr Curt Bears Referring Physician: [facility]   Gordon Phillips is a 76 y.o. male with a history of HTN, OSA w/CPAP, MUGUS, CVA (by imaging), remote pericarditis, CBP, vitiligo and migraines, atrial flutter s/p ablation, and atrial fibrillation who presents for follow up in the [facility]. The patient was initially diagnosed with atrial fibrillation and atrial flutter in  2019. He underwent flutter ablation with Dr Caryl Comes on 04/23/19. Patient is on Eliquis for a CHADS2VASC score of 5. He was in his usual state of health until 12/20/20 when he had tachypalpitations while walking. The next day, his palpitations worsened and he also developed chest pain and presented to the ED. ECG showed rapid afib. He was not cardioverted at that time because it was unclear if he was paroxysmal or persistent. He denies any significant alcohol use. Patient is s/p DCCV on 01/04/21. He reports that initially he felt "100%" but did have fatigue with exertion a few days later and was found to be in atrial flutter.  Patient is s/p afib ablation with Dr Curt Bears on 03/30/21. He was found to be back in afib at his visit on 05/29/22. Seen by Oda Kilts on 07/17/22 and a monitor was placed which showed 100% afib burden. Patient does report that he has been more fatigued recently.   Today, he denies symptoms of palpitations, chest pain, shortness of breath, orthopnea, PND, lower extremity edema, dizziness, presyncope, syncope, bleeding, or neurologic sequela. The patient is tolerating medications without difficulties and is otherwise without complaint today.    Atrial Fibrillation Risk Factors:  he does have symptoms or diagnosis of sleep apnea. he is compliant with CPAP therapy. he does not have a history of rheumatic fever. he does not have a history of alcohol use. The patient does not have  a history of early familial atrial fibrillation or other arrhythmias.  he has a BMI of Body mass index is 35.62 kg/m.Marland Kitchen Filed Weights   08/22/22 1332  Weight: 125.8 kg    Family History  Problem Relation Age of Onset   Stroke Father        Passed away in his 13s   Thyroid disease Sister        goiter   Sudden death Brother        ? Drug use, no autopsy   Throat cancer Brother    Colon cancer Neg Hx    Esophageal cancer Neg Hx    Rectal cancer Neg Hx    Stomach cancer Neg Hx      Atrial Fibrillation Management history:  Previous antiarrhythmic drugs: none Previous cardioversions: 2019, 01/04/21 Previous ablations: 04/23/19, 03/30/21 CHADS2VASC score: 5 Anticoagulation history: Eliquis   Past Medical History:  Diagnosis Date   ALLERGIC RHINITIS 01/25/2010   Arthritis    "neck, back, shoulders," (02/20/2017)   Chronic lower back pain    COVID 06/17/2021   has lingering cough that subsding   High cholesterol    Hx of cardiovascular stress test    a. [place] 02/06/13:  EF 59%, no ischemia or scar   Hx of echocardiogram    a.  [place] 02/03/13:  Mild LVH, mild focal basal septal hypertrophy, EF 50-55%, Gr 1 DD, mild LAE   HYPERTENSION 01/25/2010   HYPERTHYROIDISM 01/25/2010   S/P radioactive iodine "back in the 1980s" (02/20/2017)   Impaired glucose tolerance    MGUS (monoclonal gammopathy  of unknown significance)    "an autoimmune thing"   Obesity    OSA on CPAP    Uses CPAP nightly   Pericarditis 1980's   Stroke ([place])    Old L basal ganglia infarct by CT 01/2013.   Vitiligo    Past Surgical History:  Procedure Laterality Date   A-FLUTTER ABLATION N/A 04/23/2019   Procedure: A-FLUTTER ABLATION;  Surgeon: Deboraha Sprang, MD;  Location: [place] [facility];  Service: Cardiovascular;  Laterality: N/A;   ATRIAL FIBRILLATION ABLATION N/A 03/30/2021   Procedure: ATRIAL FIBRILLATION ABLATION;  Surgeon: Constance Haw, MD;  Location: [place] [facility];  Service:  Cardiovascular;  Laterality: N/A;   BIOPSY  09/29/2019   Procedure: BIOPSY;  Surgeon: Thornton Park, MD;  Location: [place];  Service: Gastroenterology;;   CARDIOVERSION N/A 08/14/2018   Procedure: [id number];  Surgeon: [name], MD;  Location: [facility] ENDOSCOPY;  Service: Cardiovascular;  Laterality: N/A;   CARDIOVERSION N/A 01/04/2021   Procedure: CARDIOVERSION;  Surgeon: Pixie Casino, MD;  Location: [facility] ENDOSCOPY;  Service: Cardiovascular;  Laterality: N/A;   ESOPHAGOGASTRODUODENOSCOPY N/A 09/29/2019   Procedure: ESOPHAGOGASTRODUODENOSCOPY (EGD);  Surgeon: Thornton Park, MD;  Location: [place];  Service: Gastroenterology;  Laterality: N/A;   INGUINAL HERNIA REPAIR Bilateral    LUMBAR LAMINECTOMY     TEE WITHOUT CARDIOVERSION N/A 08/14/2018   Procedure: TRANSESOPHAGEAL ECHOCARDIOGRAM (TEE);  Surgeon: [name], MD;  Location: [facility] ENDOSCOPY;  Service: Cardiovascular;  Laterality: N/A;   TEE WITHOUT CARDIOVERSION N/A 03/30/2021   Procedure: TRANSESOPHAGEAL ECHOCARDIOGRAM (TEE);  Surgeon: Skeet Latch, MD;  Location[facility] ENDOSCOPY;  Service: Cardiovascular;  Laterality: N/A;   UMBILICAL HERNIA REPAIR      Current Outpatient Medications  Medication Sig Dispense Refill   acetaminophen (TYLENOL) 650 MG CR tablet Take 1,300 mg by mouth daily as needed for pain.     Aloe-Sodium Chloride (AYR SALINE NASAL GEL NA) Place 1 application into the nose at bedtime as needed (congestion).     diltiazem (CARDIZEM CD) 120 MG 24 hr capsule TAKE 1 CAPSULE BY MOUTH EVERY DAY 90 capsule 3   doxazosin (CARDURA) 2 MG tablet TAKE 1 TABLET BY MOUTH AT BEDTIME. 90 tablet 2   ELIQUIS 5 MG TABS tablet TAKE 1 TABLET BY MOUTH TWICE A DAY 180 tablet 1   finasteride (PROSCAR) 5 MG tablet Take 5 mg by mouth daily.     furosemide (LASIX) 20 MG tablet Take 1 tablet (20 mg total) by mouth daily as needed. 90 tablet 3   hydrocortisone cream 1 % Apply 1 application topically 2 (two) times daily as  needed for itching.     Lancets (ONETOUCH ULTRASOFT) lancets Use as instructed to check blood sugar once daily 100 each 3   losartan (COZAAR) 50 MG tablet TAKE 1 TABLET BY MOUTH EVERY DAY 90 tablet 1   methocarbamol (ROBAXIN) 500 MG tablet SMARTSIG:1-2 Tablet(s) By Mouth 2-3 Times Daily PRN     metoprolol succinate (TOPROL-XL) 100 MG 24 hr tablet TAKE 1 TABLET (100 MG TOTAL) BY MOUTH AT BEDTIME. TAKE WITH OR IMMEDIATELY FOLLOWING A MEAL. 90 tablet 3   Misc Natural Products (PROSTATE THERAPY COMPLEX PO) Take 1 capsule by mouth in the morning. TruNature Prostate Plus Health Complex     ONETOUCH VERIO test strip USE AS INSTRUCTED TO CHECK BLOOD SUGAR ONCE DAILY 100 strip 3   OVER THE COUNTER MEDICATION Take 1 capsule by mouth at bedtime. OTC - Super Beta Prostate  pravastatin (PRAVACHOL) 40 MG tablet TAKE 1 TABLET BY MOUTH EVERYDAY AT BEDTIME 90 tablet 3   Prednisol Ace-Moxiflox-Bromfen 1-0.5-0.075 % SUSP Place 1 drop into the left eye 4 (four) times daily.     PRESCRIPTION MEDICATION Inhale into the lungs at bedtime. CPAP     sildenafil (VIAGRA) 100 MG tablet Take 100 mg by mouth daily as needed for erectile dysfunction.      sodium chloride (OCEAN) 0.65 % nasal spray Place 1 spray into the nose as needed for congestion.     vitamin C (ASCORBIC ACID) 500 MG tablet Take 500 mg by mouth 3 (three) times a week.     No current facility-administered medications for this encounter.    Allergies  Allergen Reactions   Flomax [Tamsulosin Hcl] Itching   Relpax [Eletriptan] Other (See Comments) and Hypertension    Chest pain and sweating   Other Palpitations    Any steroids cause heart palpitations  (tolerates low doses)    Social History   Socioeconomic History   Marital status: Married    Spouse name: Not on file   Number of children: 2   Years of education: Not on file   Highest education level: Not on file  Occupational History   Occupation: Press photographer   Occupation: retired  Tobacco Use    Smoking status: Former    Packs/day: 0.50    Years: 8.00    Total pack years: 4.00    Types: Cigarettes    Quit date: 1968    Years since quitting: 55.8   Smokeless tobacco: Never   Tobacco comments:    Former smoker 08/22/22  Vaping Use   Vaping Use: Never used  Substance and Sexual Activity   Alcohol use: Yes    Alcohol/week: 1.0 - 2.0 standard drink of alcohol    Types: 1 - 2 Shots of liquor per week    Comment: 02/20/2017 "couple drinks/month on average"   Drug use: No   Sexual activity: Yes  Other Topics Concern   Not on file  Social History Narrative   Not on file   Social Determinants of Health   Financial Resource Strain: Low Risk  (08/21/2022)   Overall Financial Resource Strain (CARDIA)    Difficulty of Paying Living Expenses: Not hard at all  Food Insecurity: No Food Insecurity (08/21/2022)   Hunger Vital Sign    Worried About Running Out of Food in the Last Year: Never true    Ran Out of Food in the Last Year: Never true  Transportation Needs: No Transportation Needs (08/21/2022)   PRAPARE - Hydrologist (Medical): No    Lack of Transportation (Non-Medical): No  Physical Activity: Inactive (08/21/2022)   Exercise Vital Sign    Days of Exercise per Week: 0 days    Minutes of Exercise per Session: 0 min  Stress: No Stress Concern Present (08/21/2022)   [place]    Feeling of Stress : Not at all  Social Connections: Moderately Isolated (08/21/2022)   Social Connection and Isolation Panel [NHANES]    Frequency of Communication with Friends and Family: More than three times a week    Frequency of Social Gatherings with Friends and Family: More than three times a week    Attends Religious Services: Never    Marine scientist or Organizations: No    Attends Archivist Meetings: Never    Marital Status: Married  Intimate  Partner Violence: Not At Risk  (08/21/2022)   Humiliation, Afraid, Rape, and Kick questionnaire    Fear of Current or Ex-Partner: No    Emotionally Abused: No    Physically Abused: No    Sexually Abused: No     ROS- All systems are reviewed and negative except as per the HPI above.  Physical Exam: Vitals:   08/22/22 1332  BP: 106/68  Pulse: 85  Weight: 125.8 kg  Height: '6\' 2"'$  (1.88 m)     GEN- The patient is a well appearing elderly male, alert and oriented x 3 today.   HEENT-head normocephalic, atraumatic, sclera clear, conjunctiva pink, hearing intact, trachea midline. Lungs- Clear to ausculation bilaterally, normal work of breathing Heart- irregular rate and rhythm, no murmurs, rubs or gallops  GI- soft, NT, ND, + BS Extremities- no clubbing, cyanosis, or edema MS- no significant deformity or atrophy Skin- no rash or lesion Psych- euthymic mood, full affect Neuro- strength and sensation are intact   Wt Readings from Last 3 Encounters:  08/22/22 125.8 kg  08/21/22 126.1 kg  07/17/22 126.1 kg    EKG today demonstrates  Afib Vent. rate 85 BPM PR interval * ms QRS duration 92 ms QT/QTcB 388/461 ms  Echo 02/07/21 demonstrated   1. Left ventricular ejection fraction, by estimation, is 50 to 55%. The  left ventricle has low normal function. The left ventricle has no regional wall motion abnormalities. There is moderate asymmetric left ventricular hypertrophy of the basal-septal segment. Left ventricular diastolic parameters are indeterminate.   2. Right ventricular systolic function is normal. The right ventricular  size is normal.   3. The mitral valve is grossly normal. Trivial mitral valve  regurgitation. No evidence of mitral stenosis.   4. The aortic valve has an indeterminant number of cusps- apperance of forme fruste aortic valve . There is moderate calcification of the aortic valve. Aortic valve regurgitation is not visualized. Aortic valve sclerosis/calcification is present,  without any  evidence of aortic stenosis.   5. The inferior vena cava is normal in size with greater than 50%  respiratory variability, suggesting right atrial pressure of 3 mmHg.   Comparison(s): LV function slightly improved from 2022 TEE (different modality assessed).    Epic records are reviewed at length today  CHA2DS2-VASc Score = 5  The patient's score is based upon: CHF History: 0 HTN History: 1 Diabetes History: 0 Stroke History: 2 Vascular Disease History: 0 Age Score: 2 Gender Score: 0        ASSESSMENT AND PLAN: 1. Persistent Atrial Fibrillation/atrial flutter The patient's CHA2DS2-VASc score is 5, indicating a 7.2% annual risk of stroke. S/p afib ablation on 03/30/21 Patient in persistent rate controlled afib with symptoms of fatigue.  We discussed rhythm control options today including DCCV, dofetilide, and repeat ablation. He would like to avoid AAD medication if possible. Will arrange for DCCV and request follow up with Dr Curt Bears to discuss possible repeat ablation.  Continue Eliquis 5 mg BID  Continue Toprol 100 mg daily Continue diltiazem 120 mg daily  2. Secondary Hypercoagulable State (ICD10:  D68.69) The patient is at significant risk for stroke/thromboembolism based upon his CHA2DS2-VASc Score of 5.  Continue Apixaban (Eliquis).   3. Obesity Body mass index is 35.62 kg/m. Lifestyle modification was discussed and encouraged including regular physical activity and weight reduction.  4. Obstructive sleep apnea Encouraged compliance with CPAP therapy.  5. HTN Stable, no changes today.   Follow up with Dr Curt Bears to discuss  repeat ablation. Patient having cataract surgery soon, he will call clinic back to discuss timing of DCCV.    [facility] [address] [place], [place] [address] [phone] 08/22/2022 1:42 PM

## 2022-08-26 NOTE — Progress Notes (Unsigned)
[place]   [phone] [phone]) [phone]   Clinic Follow up Note   Patient Care Team: Vivi Barrack, MD as PCP - General (Family Medicine) Buford Dresser, MD as PCP - Cardiology (Cardiology) Constance Haw, MD as PCP - Electrophysiology (Cardiology) Truitt Merle, MD as Consulting Physician (Hematology) [facility] as Consulting Physician (Ophthalmology) 08/28/2022  CHIEF COMPLAINT: Follow up MGUS  CURRENT THERAPY: Observation   INTERVAL HISTORY: Mr. Gordon Phillips returns for follow up as scheduled. Last seen by me 08/23/21. Labs have been stable, last done 02/2022.  Since turning 76 five months ago in July he has had more fatigue and worsening of his usual chronic back pain.  He takes Tylenol and has required injections.  He is also dealing with A-fib and is considering another ablation.  Appetite remains normal, denies unintentional weight loss.  He continues walking and working.  He is also been up to [place] 3-4 times in the past 6 months to support his daughter going through triple negative breast cancer (in her 60s). Bowels moving.  Denies bleeding.  He is having cataract surgery on his left eye tomorrow.  All other systems were reviewed with the patient and are negative.  MEDICAL HISTORY:  Past Medical History:  Diagnosis Date   ALLERGIC RHINITIS 01/25/2010   Arthritis    "neck, back, shoulders," (02/20/2017)   Chronic lower back pain    COVID 06/17/2021   has lingering cough that subsding   High cholesterol    Hx of cardiovascular stress test    a. [place] 02/06/13:  EF 59%, no ischemia or scar   Hx of echocardiogram    a.  [place] 02/03/13:  Mild LVH, mild focal basal septal hypertrophy, EF 50-55%, Gr 1 DD, mild LAE   HYPERTENSION 01/25/2010   HYPERTHYROIDISM 01/25/2010   S/P radioactive iodine "back in the 1980s" (02/20/2017)   Impaired glucose tolerance    MGUS (monoclonal gammopathy of unknown significance)     "an autoimmune thing"   Obesity    OSA on CPAP    Uses CPAP nightly   Pericarditis 1980's   Stroke ([place])    Old L basal ganglia infarct by CT 01/2013.   Vitiligo     SURGICAL HISTORY: Past Surgical History:  Procedure Laterality Date   A-FLUTTER ABLATION N/A 04/23/2019   Procedure: A-FLUTTER ABLATION;  Surgeon: Deboraha Sprang, MD;  Location: [place] [facility];  Service: Cardiovascular;  Laterality: N/A;   ATRIAL FIBRILLATION ABLATION N/A 03/30/2021   Procedure: ATRIAL FIBRILLATION ABLATION;  Surgeon: Constance Haw, MD;  Location: [place] [facility];  Service: Cardiovascular;  Laterality: N/A;   BIOPSY  09/29/2019   Procedure: BIOPSY;  Surgeon: Thornton Park, MD;  Location: [place];  Service: Gastroenterology;;   CARDIOVERSION N/A 08/14/2018   Procedure: [id number];  Surgeon: [name], MD;  Location: [facility] ENDOSCOPY;  Service: Cardiovascular;  Laterality: N/A;   CARDIOVERSION N/A 01/04/2021   Procedure: CARDIOVERSION;  Surgeon: Pixie Casino, MD;  Location: [facility] ENDOSCOPY;  Service: Cardiovascular;  Laterality: N/A;   ESOPHAGOGASTRODUODENOSCOPY N/A 09/29/2019   Procedure: ESOPHAGOGASTRODUODENOSCOPY (EGD);  Surgeon: Thornton Park, MD;  Location: [place];  Service: Gastroenterology;  Laterality: N/A;   INGUINAL HERNIA REPAIR Bilateral    LUMBAR LAMINECTOMY     TEE WITHOUT CARDIOVERSION N/A 08/14/2018   Procedure: TRANSESOPHAGEAL ECHOCARDIOGRAM (TEE);  Surgeon: [name], MD;  Location: [facility] ENDOSCOPY;  Service: Cardiovascular;  Laterality: N/A;   TEE WITHOUT CARDIOVERSION N/A  03/30/2021   Procedure: TRANSESOPHAGEAL ECHOCARDIOGRAM (TEE);  Surgeon: Skeet Latch, MD;  Location: [place];  Service: Cardiovascular;  Laterality: N/A;   UMBILICAL HERNIA REPAIR      I have reviewed the social history and family history with the patient and they are unchanged from previous note.  ALLERGIES:  is allergic to flomax [tamsulosin hcl], relpax [eletriptan],  and other.  MEDICATIONS:  Current Outpatient Medications  Medication Sig Dispense Refill   acetaminophen (TYLENOL) 650 MG CR tablet Take 1,300 mg by mouth daily as needed for pain.     Aloe-Sodium Chloride (AYR SALINE NASAL GEL NA) Place 1 application into the nose at bedtime as needed (congestion).     diltiazem (CARDIZEM CD) 120 MG 24 hr capsule TAKE 1 CAPSULE BY MOUTH EVERY DAY 90 capsule 3   doxazosin (CARDURA) 2 MG tablet TAKE 1 TABLET BY MOUTH AT BEDTIME. 90 tablet 2   ELIQUIS 5 MG TABS tablet TAKE 1 TABLET BY MOUTH TWICE A DAY 180 tablet 1   finasteride (PROSCAR) 5 MG tablet Take 5 mg by mouth daily.     furosemide (LASIX) 20 MG tablet Take 1 tablet (20 mg total) by mouth daily as needed. 90 tablet 3   hydrocortisone cream 1 % Apply 1 application topically 2 (two) times daily as needed for itching.     Lancets (ONETOUCH ULTRASOFT) lancets Use as instructed to check blood sugar once daily 100 each 3   losartan (COZAAR) 50 MG tablet TAKE 1 TABLET BY MOUTH EVERY DAY 90 tablet 1   methocarbamol (ROBAXIN) 500 MG tablet SMARTSIG:1-2 Tablet(s) By Mouth 2-3 Times Daily PRN     metoprolol succinate (TOPROL-XL) 100 MG 24 hr tablet TAKE 1 TABLET (100 MG TOTAL) BY MOUTH AT BEDTIME. TAKE WITH OR IMMEDIATELY FOLLOWING A MEAL. 90 tablet 3   Misc Natural Products (PROSTATE THERAPY COMPLEX PO) Take 1 capsule by mouth in the morning. TruNature Prostate Plus Health Complex     ONETOUCH VERIO test strip USE AS INSTRUCTED TO CHECK BLOOD SUGAR ONCE DAILY 100 strip 3   OVER THE COUNTER MEDICATION Take 1 capsule by mouth at bedtime. OTC - Super Beta Prostate     pravastatin (PRAVACHOL) 40 MG tablet TAKE 1 TABLET BY MOUTH EVERYDAY AT BEDTIME 90 tablet 3   Prednisol Ace-Moxiflox-Bromfen 1-0.5-0.075 % SUSP Place 1 drop into the left eye 4 (four) times daily.     PRESCRIPTION MEDICATION Inhale into the lungs at bedtime. CPAP     sildenafil (VIAGRA) 100 MG tablet Take 100 mg by mouth daily as needed for erectile  dysfunction.      sodium chloride (OCEAN) 0.65 % nasal spray Place 1 spray into the nose as needed for congestion.     vitamin C (ASCORBIC ACID) 500 MG tablet Take 500 mg by mouth 3 (three) times a week.     No current facility-administered medications for this visit.    PHYSICAL EXAMINATION: ECOG PERFORMANCE STATUS: 1 - Symptomatic but completely ambulatory  Vitals:   08/28/22 1245  BP: (!) 144/82  Pulse: 68  Temp: 98.1 F (36.7 C)  SpO2: 98%   Filed Weights   08/28/22 1245  Weight: 278 lb 1.6 oz (126.1 kg)    GENERAL:alert, no distress and comfortable SKIN: Vitiligo EYES: sclera clear LUNGS: clear with normal breathing effort HEART: A-fib, rate controlled, no lower extremity edema ABDOMEN: abdomen soft, non-tender and normal bowel sounds Musculoskeletal: No focal lower back or other spinal tenderness NEURO: alert & oriented x 3 with  fluent speech, no focal motor/sensory deficits  LABORATORY DATA:  I have reviewed the data as listed    Latest Ref Rng & Units 08/28/2022   12:27 PM 02/21/2022    2:32 PM 08/23/2021   10:42 AM  CBC  WBC 4.0 - 10.5 K/uL 6.9  5.4  6.3   Hemoglobin 13.0 - 17.0 g/dL 13.9  13.2  13.4   Hematocrit 39.0 - 52.0 % 41.0  39.1  40.3   Platelets 150 - 400 K/uL 130  122  145         Latest Ref Rng & Units 08/28/2022   12:27 PM 07/17/2022   12:16 PM 02/21/2022    2:32 PM  CMP  Glucose 70 - 99 mg/dL 110  108  78   BUN 8 - 23 mg/dL '16  12  16   ' Creatinine 0.61 - 1.24 mg/dL 1.05  0.88  1.08   Sodium 135 - 145 mmol/L 138  144  140   Potassium 3.5 - 5.1 mmol/L 3.8  4.2  4.2   Chloride 98 - 111 mmol/L 107  107  107   CO2 22 - 32 mmol/L '27  22  28   ' Calcium 8.9 - 10.3 mg/dL 9.1  9.3  9.2   Total Protein 6.5 - 8.1 g/dL 7.1   7.5   Total Bilirubin 0.3 - 1.2 mg/dL 0.9   0.7   Alkaline Phos 38 - 126 U/L 45   50   AST 15 - 41 U/L 16   16   ALT 0 - 44 U/L 16   12       RADIOGRAPHIC STUDIES: I have personally reviewed the radiological images as  listed and agreed with the findings in the report. No results found.   ASSESSMENT & PLAN:  76 y.o. male  1. MGUS -His initial SPEP revealed a low-level of monoclonal paraprotein (0.27g/dl), immunoglobulin level were normal, no anemia, renal dysfunction, or hypercalcemia -His previous CT scan revealed a small hyper lytic lesion in the C5 vertebral body however was not obvious on the MRI scan. -A bone survey showed subtle lucencies in multiple bones, indeterminate.  -Bone marrow biopsy showed 5% plasma cells, suggestive of plasma cell dyscrasia. Cytogenetics was normal. -M protein and other MM panel have remained stable; currently on q6 month lab and annual follow-up observation -Mr. Krogh is clinically doing well except progressive fatigue and low back pain.  CBC normal except mild thrombocytopenia which is stable.  CMP shows normal renal function and calcium. -I feel his fatigue and back pain are unlikely related to MGUS.  We will follow-up the pending MM panel and light chains from today.  If stable I recommend to continue q6 month lab and annual follow-up   2. Mild thrombocytopenia -The platelet count has been in the range of 120 to 153 since 2016 -He does have other autoimmune disease including Graves' disease and vitiligo.  ITP is a possibility - His bone marrow biopsy showed abundant megakaryocytes, some of which display atypical features, the significance is uncertain. -stable   3. Chronic back pain  -His lumbar MRI in March 2016 showed severe degenerative changes, which is likely the cause of his back pain. -His back pain worsened in 2020, lumbar and cervical spine CTs 08/13/19 showed moderate to severe neural foraminal stenosis -His back pain worsened last year, bone survey 09/27/2021 showed stable appearance of 2 calvarial lucencies, no new lytic lesions, and multilevel degenerative change throughout the spine with possible progression of  the disc disease in the thoracic spine.   -His back pain has progressively worsened since turning 76.  No new pain.  We will hold on repeating bone survey for now - continue follow-up with Ortho for injections and other management   4. Hypertension, dyslipidemia, fatigue, A-fib -f/u PCP and cardiology -He has more fatigue he attributes to A-fib, considering repeat ablation   6. Cancer screening  -per PCP -colonoscopy done 05/2018    Plan: -CBC/CMP reviewed, follow-up the pending MGUS labs from today -If stable continue observation and q6 month lab -Fatigue and back pain eval/management per care team -Follow-up in 1 year, or sooner if needed   All questions were answered. The patient knows to call the clinic with any problems, questions or concerns. No barriers to learning were detected.      Alla Feeling, NP 08/28/22

## 2022-08-28 ENCOUNTER — Other Ambulatory Visit: Payer: Self-pay

## 2022-08-28 ENCOUNTER — Inpatient Hospital Stay: Payer: Medicare HMO | Admitting: Nurse Practitioner

## 2022-08-28 ENCOUNTER — Encounter: Payer: Self-pay | Admitting: Nurse Practitioner

## 2022-08-28 ENCOUNTER — Inpatient Hospital Stay: Payer: Medicare HMO | Attending: Nurse Practitioner

## 2022-08-28 VITALS — BP 144/82 | HR 68 | Temp 98.1°F | Wt 278.1 lb

## 2022-08-28 DIAGNOSIS — D472 Monoclonal gammopathy: Secondary | ICD-10-CM

## 2022-08-28 DIAGNOSIS — I10 Essential (primary) hypertension: Secondary | ICD-10-CM | POA: Insufficient documentation

## 2022-08-28 DIAGNOSIS — D696 Thrombocytopenia, unspecified: Secondary | ICD-10-CM | POA: Insufficient documentation

## 2022-08-28 DIAGNOSIS — Z7901 Long term (current) use of anticoagulants: Secondary | ICD-10-CM | POA: Diagnosis not present

## 2022-08-28 DIAGNOSIS — G8929 Other chronic pain: Secondary | ICD-10-CM | POA: Diagnosis not present

## 2022-08-28 DIAGNOSIS — I4891 Unspecified atrial fibrillation: Secondary | ICD-10-CM | POA: Diagnosis not present

## 2022-08-28 LAB — CBC WITH DIFFERENTIAL (CANCER CENTER ONLY)
Abs Immature Granulocytes: 0.02 10*3/uL (ref 0.00–0.07)
Basophils Absolute: 0 10*3/uL (ref 0.0–0.1)
Basophils Relative: 0 %
Eosinophils Absolute: 0.1 10*3/uL (ref 0.0–0.5)
Eosinophils Relative: 1 %
HCT: 41 % (ref 39.0–52.0)
Hemoglobin: 13.9 g/dL (ref 13.0–17.0)
Immature Granulocytes: 0 %
Lymphocytes Relative: 32 %
Lymphs Abs: 2.2 10*3/uL (ref 0.7–4.0)
MCH: 31.3 pg (ref 26.0–34.0)
MCHC: 33.9 g/dL (ref 30.0–36.0)
MCV: 92.3 fL (ref 80.0–100.0)
Monocytes Absolute: 0.5 10*3/uL (ref 0.1–1.0)
Monocytes Relative: 7 %
Neutro Abs: 4.1 10*3/uL (ref 1.7–7.7)
Neutrophils Relative %: 60 %
Platelet Count: 130 10*3/uL — ABNORMAL LOW (ref 150–400)
RBC: 4.44 MIL/uL (ref 4.22–5.81)
RDW: 13 % (ref 11.5–15.5)
WBC Count: 6.9 10*3/uL (ref 4.0–10.5)
nRBC: 0 % (ref 0.0–0.2)

## 2022-08-28 LAB — CMP (CANCER CENTER ONLY)
ALT: 16 U/L (ref 0–44)
AST: 16 U/L (ref 15–41)
Albumin: 4 g/dL (ref 3.5–5.0)
Alkaline Phosphatase: 45 U/L (ref 38–126)
Anion gap: 4 — ABNORMAL LOW (ref 5–15)
BUN: 16 mg/dL (ref 8–23)
CO2: 27 mmol/L (ref 22–32)
Calcium: 9.1 mg/dL (ref 8.9–10.3)
Chloride: 107 mmol/L (ref 98–111)
Creatinine: 1.05 mg/dL (ref 0.61–1.24)
GFR, Estimated: >60 mL/min (ref >60)
Glucose, Bld: 110 mg/dL — ABNORMAL HIGH (ref 70–99)
Potassium: 3.8 mmol/L (ref 3.5–5.1)
Sodium: 138 mmol/L (ref 135–145)
Total Bilirubin: 0.9 mg/dL (ref 0.3–1.2)
Total Protein: 7.1 g/dL (ref 6.5–8.1)

## 2022-08-29 LAB — KAPPA/LAMBDA LIGHT CHAINS
Kappa free light chain: 14 mg/L (ref 3.3–19.4)
Kappa, lambda light chain ratio: 1.67 — ABNORMAL HIGH (ref 0.26–1.65)
Lambda free light chains: 8.4 mg/L (ref 5.7–26.3)

## 2022-08-31 LAB — MULTIPLE MYELOMA PANEL, SERUM
Albumin SerPl Elph-Mcnc: 3.7 g/dL (ref 2.9–4.4)
Albumin/Glob SerPl: 1.3 (ref 0.7–1.7)
Alpha 1: 0.2 g/dL (ref 0.0–0.4)
Alpha2 Glob SerPl Elph-Mcnc: 0.8 g/dL (ref 0.4–1.0)
B-Globulin SerPl Elph-Mcnc: 0.8 g/dL (ref 0.7–1.3)
Gamma Glob SerPl Elph-Mcnc: 1.1 g/dL (ref 0.4–1.8)
Globulin, Total: 2.9 g/dL (ref 2.2–3.9)
IgA: 114 mg/dL (ref 61–437)
IgG (Immunoglobin G), Serum: 1273 mg/dL (ref 603–1613)
IgM (Immunoglobulin M), Srm: 78 mg/dL (ref 15–143)
M Protein SerPl Elph-Mcnc: 0.4 g/dL — ABNORMAL HIGH
Total Protein ELP: 6.6 g/dL (ref 6.0–8.5)

## 2022-09-04 ENCOUNTER — Encounter: Payer: Self-pay | Admitting: Cardiology

## 2022-09-04 ENCOUNTER — Ambulatory Visit: Payer: Medicare HMO | Attending: Cardiology | Admitting: Cardiology

## 2022-09-04 VITALS — BP 132/80 | HR 81 | Ht 74.0 in | Wt 280.0 lb

## 2022-09-04 DIAGNOSIS — D6869 Other thrombophilia: Secondary | ICD-10-CM | POA: Diagnosis not present

## 2022-09-04 DIAGNOSIS — I484 Atypical atrial flutter: Secondary | ICD-10-CM

## 2022-09-04 DIAGNOSIS — I4819 Other persistent atrial fibrillation: Secondary | ICD-10-CM

## 2022-09-04 NOTE — Patient Instructions (Signed)
Medication Instructions:  Your physician recommends that you continue on your current medications as directed. Please refer to the Current Medication list given to you today.  *If you need a refill on your cardiac medications before your next appointment, please call your pharmacy*   Lab Work: None ordered.  If you have labs (blood work) drawn today and your tests are completely normal, you will receive your results only by: [place] (if you have MyChart) OR A paper copy in the mail If you have any lab test that is abnormal or we need to change your treatment, we will call you to review the results.   Testing/Procedures: Your physician has recommended that you have an ablation. Catheter ablation is a medical procedure used to treat some cardiac arrhythmias (irregular heartbeats). During catheter ablation, a long, thin, flexible tube is put into a blood vessel in your groin (upper thigh), or neck. This tube is called an ablation catheter. It is then guided to your heart through the blood vessel. Radio frequency waves destroy small areas of heart tissue where abnormal heartbeats may cause an arrhythmia to start. Please see the instruction sheet given to you today.    Follow-Up: At [facility], you and your health needs are our priority.  As part of our continuing mission to provide you with exceptional heart care, we have created designated Provider Care Teams.  These Care Teams include your primary Cardiologist (physician) and Advanced Practice Providers (APPs -  Physician Assistants and Nurse Practitioners) who all work together to provide you with the care you need, when you need it.  We recommend signing up for the patient portal called "MyChart".  Sign up information is provided on this After Visit Summary.  MyChart is used to connect with patients for Virtual Visits (Telemedicine).  Patients are able to view lab/test results, encounter notes, upcoming appointments, etc.   Non-urgent messages can be sent to your provider as well.   To learn more about what you can do with MyChart, go to [email or web address].    Your next appointment:   To be scheduled  Important Information About Sugar

## 2022-09-04 NOTE — Progress Notes (Signed)
Electrophysiology Office Note   Date:  09/04/2022   ID:  Gordon Phillips, DOB 12-Jul-1946, MRN [id number]  PCP:  Vivi Barrack, MD  Cardiologist:  Harrell Gave Primary Electrophysiologist: Meridee Score, MD    Chief Complaint: AF   History of Present Illness: Gordon Phillips is a 76 y.o. male who is being seen today for the evaluation of AF at the request of Vivi Barrack, MD. Presenting today for electrophysiology evaluation.  He has a history significant for hypertension, sleep apnea on CPAP, MGUS, CVA by imaging, remote pericarditis, atrial flutter post ablation and atrial fibrillation.  He is status post atrial flutter ablations 04/23/2019.  He is status post atrial fibrillation ablation 03/30/2021.  Today, denies symptoms of palpitations, chest pain, shortness of breath, orthopnea, PND, lower extremity edema, claudication, dizziness, presyncope, syncope, bleeding, or neurologic sequela. The patient is tolerating medications without difficulties.  Since being seen, he has unfortunately had more frequent episodes of atrial flutter.  He has had multiple cardioversions.  He continues to feel weak, fatigued, short of breath.  He would like to get back into normal rhythm.  He states that he would prefer to avoid medications if possible.  He certainly stands that if he has multiple atrial flutter circuits as well as atrial fibrillation, that antiarrhythmics would be reasonable.   Past Medical History:  Diagnosis Date   ALLERGIC RHINITIS 01/25/2010   Arthritis    "neck, back, shoulders," (02/20/2017)   Chronic lower back pain    COVID 06/17/2021   has lingering cough that subsding   High cholesterol    Hx of cardiovascular stress test    a. [place] 02/06/13:  EF 59%, no ischemia or scar   Hx of echocardiogram    a.  [facility] 02/03/13:  Mild LVH, mild focal basal septal hypertrophy, EF 50-55%, Gr 1 DD, mild LAE   HYPERTENSION 01/25/2010   HYPERTHYROIDISM 01/25/2010   S/P  radioactive iodine "back in the 1980s" (02/20/2017)   Impaired glucose tolerance    MGUS (monoclonal gammopathy of unknown significance)    "an autoimmune thing"   Obesity    OSA on CPAP    Uses CPAP nightly   Pericarditis 1980's   Stroke ([place])    [place] L basal ganglia infarct by CT 01/2013.   Vitiligo    Past Surgical History:  Procedure Laterality Date   A-FLUTTER ABLATION N/A 04/23/2019   Procedure: A-FLUTTER ABLATION;  Surgeon: Deboraha Sprang, MD;  Location: [facility] [facility];  Service: Cardiovascular;  Laterality: N/A;   ATRIAL FIBRILLATION ABLATION N/A 03/30/2021   Procedure: ATRIAL FIBRILLATION ABLATION;  Surgeon: Constance Haw, MD;  Location: [facility];  Service: Cardiovascular;  Laterality: N/A;   BIOPSY  09/29/2019   Procedure: BIOPSY;  Surgeon: Thornton Park, MD;  Location: [facility];  Service: Gastroenterology;;   CARDIOVERSION N/A 08/14/2018   Procedure: [id number];  Surgeon: [name], MD;  Location: [facility] END[facility]COPY;  Service: Cardiovascular;  Laterality: N/A;   CARDIOVERSION N/A 01/04/2021   Procedure: CARDIOVERSION;  Surgeon: Pixie Casino, MD;  Location: [facility] ENDOSCOPY;  Service: Cardiovascular;  Laterality: N/A;   ESOPHAGOGASTRODUODENOSCOPY N/A 09/29/2019   Procedure: ESOPHAGOGASTRODUODENOSCOPY (EGD);  Surgeon: Thornton Park, MD;  Location: [facility];  Service: Gastroenterology;  Laterality: N/A;   INGUINAL HERNIA REPAIR Bilateral    LUMBAR LAMINECTOMY     TEE WITHOUT CARDIOVERSION N/A 08/14/2018   Procedure: TRANSESOPHAGEAL ECHOCARDIOGRAM (TEE);  Surgeon: [name], MD;  Location: [facility] ENDOSCOPY;  Service: Cardiovascular;  Laterality: N/A;   TEE WITHOUT CARDIOVERSION N/A 03/30/2021   Procedure: TRANSESOPHAGEAL ECHOCARDIOGRAM (TEE);  Surgeon: Skeet Latch, MD;  Location: [facility] END[facility]COPY;  Service: Cardiovascular;  Laterality: N/A;   UMBILICAL HERNIA REPAIR       Current Outpatient Medications  Medication Sig Dispense Refill    acetaminophen (TYLENOL) 650 MG CR tablet Take 1,300 mg by mouth daily as needed for pain.     Aloe-Sodium Chloride (AYR SALINE NASAL GEL NA) Place 1 application into the nose at bedtime as needed (congestion).     diltiazem (CARDIZEM CD) 120 MG 24 hr capsule TAKE 1 CAPSULE BY MOUTH EVERY DAY 90 capsule 3   doxazosin (CARDURA) 2 MG tablet TAKE 1 TABLET BY MOUTH AT BEDTIME. 90 tablet 2   ELIQUIS 5 MG TABS tablet TAKE 1 TABLET BY MOUTH TWICE A DAY 180 tablet 1   finasteride (PROSCAR) 5 MG tablet Take 5 mg by mouth daily.     furosemide (LASIX) 20 MG tablet Take 1 tablet (20 mg total) by mouth daily as needed. 90 tablet 3   hydrocortisone cream 1 % Apply 1 application topically 2 (two) times daily as needed for itching.     Lancets (ONETOUCH ULTRASOFT) lancets Use as instructed to check blood sugar once daily 100 each 3   losartan (COZAAR) 50 MG tablet TAKE 1 TABLET BY MOUTH EVERY DAY 90 tablet 1   methocarbamol (ROBAXIN) 500 MG tablet SMARTSIG:1-2 Tablet(s) By Mouth 2-3 Times Daily PRN     metoprolol succinate (TOPROL-XL) 100 MG 24 hr tablet TAKE 1 TABLET (100 MG TOTAL) BY MOUTH AT BEDTIME. TAKE WITH OR IMMEDIATELY FOLLOWING A MEAL. 90 tablet 3   Misc Natural Products (PROSTATE THERAPY COMPLEX PO) Take 1 capsule by mouth in the morning. TruNature Prostate Plus Health Complex     ONETOUCH VERIO test strip USE AS INSTRUCTED TO CHECK BLOOD SUGAR ONCE DAILY 100 strip 3   OVER THE COUNTER MEDICATION Take 1 capsule by mouth at bedtime. OTC - Super Beta Prostate     pravastatin (PRAVACHOL) 40 MG tablet TAKE 1 TABLET BY MOUTH EVERYDAY AT BEDTIME 90 tablet 3   Prednisol Ace-Moxiflox-Bromfen 1-0.5-0.075 % SUSP Place 1 drop into the left eye 4 (four) times daily.     PRESCRIPTION MEDICATION Inhale into the lungs at bedtime. CPAP     sildenafil (VIAGRA) 100 MG tablet Take 100 mg by mouth daily as needed for erectile dysfunction.      sodium chloride (OCEAN) 0.65 % nasal spray Place 1 spray into the nose as  needed for congestion.     vitamin C (ASCORBIC ACID) 500 MG tablet Take 500 mg by mouth 3 (three) times a week.     No current facility-administered medications for this visit.    Allergies:   Flomax [tamsulosin hcl], Relpax [eletriptan], and Other   Social History:  The patient  reports that he quit smoking about 55 years ago. His smoking use included cigarettes. He has a 4.00 pack-year smoking history. He has never used smokeless tobacco. He reports current alcohol use of about 1.0 - 2.0 standard drink of alcohol per week. He reports that he does not use drugs.   Family History:  The patient's family history includes Stroke in his father; Sudden death in his brother; Throat cancer in his brother; Thyroid disease in his sister.   ROS:  Please see the history of present illness.   Otherwise, review of systems is positive for none.   All other  systems are reviewed and negative.   PHYSICAL EXAM: VS:  BP 132/80   Pulse 81   Ht '6\' 2"'$  (1.88 m)   Wt 280 lb (127 kg)   SpO2 93%   BMI 35.95 kg/m  , BMI Body mass index is 35.95 kg/m. GEN: Well nourished, well developed, in no acute distress  HEENT: normal  Neck: no JVD, carotid bruits, or masses Cardiac: irregular; no murmurs, rubs, or gallops,no edema  Respiratory:  clear to auscultation bilaterally, normal work of breathing GI: soft, nontender, nondistended, + BS MS: no deformity or atrophy  Skin: warm and dry Neuro:  Strength and sensation are intact Psych: euthymic mood, full affect  EKG:  EKG is ordered today. Personal review of the ekg ordered shows atrial flutter, rate 81  Recent Labs: 03/27/2022: TSH 0.71 08/28/2022: ALT 16; BUN 16; Creatinine 1.05; Hemoglobin 13.9; Platelet Count 130; Potassium 3.8; Sodium 138    Lipid Panel     Component Value Date/Time   CHOL 124 03/27/2022 1432   TRIG 47.0 03/27/2022 1432   HDL 47.50 03/27/2022 1432   CHOLHDL 3 03/27/2022 1432   VLDL 9.4 03/27/2022 1432   LDLCALC 67 03/27/2022 1432    LDLDIRECT 139.2 11/21/2012 1122     Wt Readings from Last 3 Encounters:  09/04/22 280 lb (127 kg)  08/28/22 278 lb 1.6 oz (126.1 kg)  08/22/22 277 lb 6.4 oz (125.8 kg)      Other studies Reviewed: Additional studies/ records that were reviewed today include: TTE 02/07/21  Review of the above records today demonstrates:   1. Left ventricular ejection fraction, by estimation, is 55 to 60%. The  left ventricle has normal function. The left ventricle has no regional  wall motion abnormalities. There is mild concentric left ventricular  hypertrophy of the basal-septal segment.  Left ventricular diastolic parameters are indeterminate.   2. Right ventricular systolic function is normal. The right ventricular  size is normal. There is mildly elevated pulmonary artery systolic  pressure.   3. The mitral valve is grossly normal. Trivial mitral valve  regurgitation. No evidence of mitral stenosis.   4. The aortic valve is tricuspid. There is mild thickening of the aortic  valve. Aortic valve regurgitation is not visualized. No aortic stenosis is  present.   5. The inferior vena cava is normal in size with greater than 50%  respiratory variability, suggesting right atrial pressure of 3 mmHg.    ASSESSMENT AND PLAN:  1.  Persistent atrial fibrillation/flutter: Currently on Eliquis 5 mg twice daily.  CHA2DS2-VASc of 5.  Is now status post ablation 03/30/2021.  He is now in a left atrial flutter.  He feels weak and fatigued.  He has had a cardioversion, but has unfortunately continued to have flutter.  He would prefer to avoid medications.  Due to that, we Takirah Binford plan for ablation.  He understands that if he has multiple atrial flutters, that he would likely benefit from medication management most beneficial would likely be dofetilide.  He would be amenable to admission to the hospital for dofetilide load if ablation shows multiple flutter circuits.  Risk, benefits, and alternatives to EP study and  radiofrequency ablation for afib were also discussed in detail today. These risks include but are not limited to stroke, bleeding, vascular damage, tamponade, perforation, damage to the esophagus, lungs, and other structures, pulmonary vein stenosis, worsening renal function, and death. The patient understands these risk and wishes to proceed.  We Yadiel Aubry therefore proceed with catheter ablation  at the next available time.  Carto, ICE, anesthesia are requested for the procedure.  Ninette Cotta also obtain CT PV protocol prior to the procedure to exclude LAA thrombus and further evaluate atrial anatomy.   2.  Obesity: Diet and exercise encouraged Body mass index is 35.95 kg/m.  3.  Obstructive sleep apnea: CPAP compliance encouraged  4.  Hypertension: Currently well controlled  5.  Secondary hypercoagulable state: Currently on Eliquis for atrial fibrillation as above   Current medicines are reviewed at length with the patient today.   The patient does not have concerns regarding his medicines.  The following changes were made today: None  Labs/ tests ordered today include:  Orders Placed This Encounter  Procedures   EKG 12-Lead      Disposition:   FU 3 months  Signed, Quantae Martel Meredith Leeds, MD  09/04/2022 6:28 PM     [address] [place] [address] [phone] (office) [phone] (fax)

## 2022-09-05 ENCOUNTER — Other Ambulatory Visit: Payer: Self-pay

## 2022-09-05 ENCOUNTER — Telehealth: Payer: Self-pay | Admitting: *Deleted

## 2022-09-05 DIAGNOSIS — I4891 Unspecified atrial fibrillation: Secondary | ICD-10-CM

## 2022-09-05 NOTE — Telephone Encounter (Signed)
LM with note below. To call if he has any questions

## 2022-09-05 NOTE — Telephone Encounter (Signed)
-----   Message from Alla Feeling, NP sent at 09/05/2022 11:31 AM EDT ----- Please let pt know MGUS panel is stable, no change or new concerns. Continue lab q6 months and see me in 1 year.  Thanks, Regan Rakers, NP

## 2022-09-06 ENCOUNTER — Telehealth: Payer: Self-pay

## 2022-09-06 HISTORY — PX: CATARACT EXTRACTION: SUR2

## 2022-09-06 NOTE — Telephone Encounter (Signed)
I spoke with pt and made him aware that I had sent him Instruction letters via MyChart for CT/Ablation. He is aware that as soon as the APP schedule opens for March I will call him to schedule an appt for him to come in for labs and to discuss instructions.

## 2022-09-11 ENCOUNTER — Ambulatory Visit (INDEPENDENT_AMBULATORY_CARE_PROVIDER_SITE_OTHER): Payer: Medicare HMO | Admitting: Family Medicine

## 2022-09-11 ENCOUNTER — Encounter: Payer: Self-pay | Admitting: Family Medicine

## 2022-09-11 VITALS — BP 121/70 | HR 57 | Temp 97.7°F | Ht 73.0 in | Wt 282.2 lb

## 2022-09-11 DIAGNOSIS — I5030 Unspecified diastolic (congestive) heart failure: Secondary | ICD-10-CM

## 2022-09-11 DIAGNOSIS — Z23 Encounter for immunization: Secondary | ICD-10-CM | POA: Diagnosis not present

## 2022-09-11 DIAGNOSIS — Z0001 Encounter for general adult medical examination with abnormal findings: Secondary | ICD-10-CM

## 2022-09-11 DIAGNOSIS — Z79899 Other long term (current) drug therapy: Secondary | ICD-10-CM | POA: Diagnosis not present

## 2022-09-11 DIAGNOSIS — M79606 Pain in leg, unspecified: Secondary | ICD-10-CM

## 2022-09-11 DIAGNOSIS — M549 Dorsalgia, unspecified: Secondary | ICD-10-CM

## 2022-09-11 DIAGNOSIS — R7303 Prediabetes: Secondary | ICD-10-CM

## 2022-09-11 DIAGNOSIS — G8929 Other chronic pain: Secondary | ICD-10-CM

## 2022-09-11 DIAGNOSIS — I4819 Other persistent atrial fibrillation: Secondary | ICD-10-CM | POA: Diagnosis not present

## 2022-09-11 DIAGNOSIS — I4892 Unspecified atrial flutter: Secondary | ICD-10-CM

## 2022-09-11 DIAGNOSIS — E559 Vitamin D deficiency, unspecified: Secondary | ICD-10-CM

## 2022-09-11 DIAGNOSIS — I10 Essential (primary) hypertension: Secondary | ICD-10-CM

## 2022-09-11 LAB — VITAMIN D 25 HYDROXY (VIT D DEFICIENCY, FRACTURES): VITD: 22.7 ng/mL — ABNORMAL LOW (ref 30.00–100.00)

## 2022-09-11 LAB — LIPID PANEL
Cholesterol: 124 mg/dL (ref 0–200)
HDL: 50.2 mg/dL (ref >39.00)
LDL Cholesterol: 60 mg/dL (ref 0–99)
NonHDL: 74.17
Total CHOL/HDL Ratio: 2
Triglycerides: 69 mg/dL (ref 0.0–149.0)
VLDL: 13.8 mg/dL (ref 0.0–40.0)

## 2022-09-11 LAB — TSH: TSH: 0.83 u[IU]/mL (ref 0.35–5.50)

## 2022-09-11 LAB — VITAMIN B12: Vitamin B-12: 207 pg/mL — ABNORMAL LOW (ref 211–911)

## 2022-09-11 LAB — HEMOGLOBIN A1C: Hgb A1c MFr Bld: 6.5 % (ref 4.6–6.5)

## 2022-09-11 NOTE — Assessment & Plan Note (Signed)
Continue management by cardiology.  Will be having ablation soon.

## 2022-09-11 NOTE — Patient Instructions (Signed)
It was very nice to see you today!  We will check blood work today.  I would like to do a test to make sure that you are getting plenty blood flow to your legs.  Ozempic may be a good option we will see what shows up in your blood work first.  Please continue to work on diet and exercise.  We will see back in year for your next physical.  Please come back sooner as needed.  Take care, Dr [name]  PLEASE NOTE:  If you had any lab tests please let us know if you have not heard back within a few days. You may see your results on mychart before we have a chance to review them but we will give you a call once they are reviewed by [place]. If we ordered any referrals today, please let us know if you have not heard from their office within the next week.   Please try these tips to maintain a healthy lifestyle:  Eat at least 3 REAL meals and 1-2 snacks per day.  Aim for no more than 5 hours between eating.  If you eat breakfast, please do so within one hour of getting up.   Each meal should contain half fruits/vegetables, one quarter protein, and one quarter carbs (no bigger than a computer mouse)  Cut down on sweet beverages. This includes juice, soda, and sweet tea.   Drink at least 1 glass of water with each meal and aim for at least 8 glasses per day  Exercise at least 150 minutes every week.    Preventive Care 76 Years and Older, Male Preventive care refers to lifestyle choices and visits with your health care provider that can promote health and wellness. Preventive care visits are also called wellness exams. What can I expect for my preventive care visit? Counseling During your preventive care visit, your health care provider may ask about your: Medical history, including: Past medical problems. Family medical history. History of falls. Current health, including: Emotional well-being. Home life and relationship well-being. Sexual activity. Memory and ability to understand  (cognition). Lifestyle, including: Alcohol, nicotine or tobacco, and drug use. Access to firearms. Diet, exercise, and sleep habits. Work and work Statistician. Sunscreen use. Safety issues such as seatbelt and bike helmet use. Physical exam Your health care provider will check your: Height and weight. These may be used to calculate your BMI (body mass index). BMI is a measurement that tells if you are at a healthy weight. Waist circumference. This measures the distance around your waistline. This measurement also tells if you are at a healthy weight and may help predict your risk of certain diseases, such as type 2 diabetes and high blood pressure. Heart rate and blood pressure. Body temperature. Skin for abnormal spots. What immunizations do I need?  Vaccines are usually given at various ages, according to a schedule. Your health care provider will recommend vaccines for you based on your age, medical history, and lifestyle or other factors, such as travel or where you work. What tests do I need? Screening Your health care provider may recommend screening tests for certain conditions. This may include: Lipid and cholesterol levels. Diabetes screening. This is done by checking your blood sugar (glucose) after you have not eaten for a while (fasting). Hepatitis C test. Hepatitis B test. HIV (human immunodeficiency virus) test. STI (sexually transmitted infection) testing, if you are at risk. Lung cancer screening. Colorectal cancer screening. Prostate cancer screening. Abdominal aortic aneurysm (AAA)  screening. You may need this if you are a current or former smoker. Talk with your health care provider about your test results, treatment options, and if necessary, the need for more tests. Follow these instructions at home: Eating and drinking  Eat a diet that includes fresh fruits and vegetables, whole grains, lean protein, and low-fat dairy products. Limit your intake of foods with  high amounts of sugar, saturated fats, and salt. Take vitamin and mineral supplements as recommended by your health care provider. Do not drink alcohol if your health care provider tells you not to drink. If you drink alcohol: Limit how much you have to 0-2 drinks a day. Know how much alcohol is in your drink. In the [place] one drink equals one 12 oz bottle of beer (355 mL), one 5 oz glass of wine (148 mL), or one 1 oz glass of hard liquor (44 mL). Lifestyle Brush your teeth every morning and night with fluoride toothpaste. Floss one time each day. Exercise for at least 30 minutes 5 or more days each week. Do not use any products that contain nicotine or tobacco. These products include cigarettes, chewing tobacco, and vaping devices, such as e-cigarettes. If you need help quitting, ask your health care provider. Do not use drugs. If you are sexually active, practice safe sex. Use a condom or other form of protection to prevent STIs. Take aspirin only as told by your health care provider. Make sure that you understand how much to take and what form to take. Work with your health care provider to find out whether it is safe and beneficial for you to take aspirin daily. Ask your health care provider if you need to take a cholesterol-lowering medicine (statin). Find healthy ways to manage stress, such as: Meditation, yoga, or listening to music. Journaling. Talking to a trusted person. Spending time with friends and family. Safety Always wear your seat belt while driving or riding in a vehicle. Do not drive: If you have been drinking alcohol. Do not ride with someone who has been drinking. When you are tired or distracted. While texting. If you have been using any mind-altering substances or drugs. Wear a helmet and other protective equipment during sports activities. If you have firearms in your house, make sure you follow all gun safety procedures. Minimize exposure to UV radiation to  reduce your risk of skin cancer. What's next? Visit your health care provider once a year for an annual wellness visit. Ask your health care provider how often you should have your eyes and teeth checked. Stay up to date on all vaccines. This information is not intended to replace advice given to you by your health care provider. Make sure you discuss any questions you have with your health care provider. Document Revised: 04/20/2021 Document Reviewed: 04/20/2021 Elsevier Patient Education  [place].

## 2022-09-11 NOTE — Assessment & Plan Note (Signed)
BMI today 37 with comorbidities.  May benefit from GLP-1 agonist.  We did briefly discuss starting Ozempic.  Will check A1c today.  Depending on results may start Ozempic.

## 2022-09-11 NOTE — Assessment & Plan Note (Signed)
Follows with cardiology.  No signs of volume overload today.

## 2022-09-11 NOTE — Assessment & Plan Note (Signed)
Check A1c.  Depending on results may be beneficial to start GLP-1 agonist as above.

## 2022-09-11 NOTE — Progress Notes (Signed)
Chief Complaint:  Gordon Phillips is a 76 y.o. male who presents today for his annual comprehensive physical exam.    Assessment/Plan:  New/Acute Problems: Leg Pain No significant abnormalities on exam.  Does have a known history of lower back degenerative changes which is likely contributing.  May have some degree of spinal stenosis and neurogenic claudication.  We will rule out check ABI to rule out vascular disease.  If this is normal will need to follow-up with orthopedics  Chronic Problems Addressed Today: (HFpEF) heart failure with preserved ejection fraction ([place]) Follows with cardiology.  No signs of volume overload today.  Back pain, chronic Follows with orthopedics.  Currently on methocarbamol.  Has had a few steroid injections with some modest improvement.  Could be contributing to his bilateral leg pain as above.  Essential hypertension At goal today.  We will continue management per cardiology.  Morbid obesity ([place]) BMI today 37 with comorbidities.  May benefit from GLP-1 agonist.  We did briefly discuss starting Ozempic.  Will check A1c today.  Depending on results may start Ozempic.  Persistent atrial fibrillation ([place]) Continue management by cardiology.  Will be having ablation soon.  Prediabetes Check A1c.  Depending on results may be beneficial to start GLP-1 agonist as above.  Preventative Healthcare: Flu shot given today.  Check labs.  Due for colon cancer screening next year.  Patient Counseling(The following topics were reviewed and/or handout was given):  -Nutrition: Stressed importance of moderation in sodium/caffeine intake, saturated fat and cholesterol, caloric balance, sufficient intake of fresh fruits, vegetables, and fiber.  -Stressed the importance of regular exercise.   -Substance Abuse: Discussed cessation/primary prevention of tobacco, alcohol, or other drug use; driving or other dangerous activities under the influence; availability of treatment  for abuse.   -Injury prevention: Discussed safety belts, safety helmets, smoke detector, smoking near bedding or upholstery.   -Sexuality: Discussed sexually transmitted diseases, partner selection, use of condoms, avoidance of unintended pregnancy and contraceptive alternatives.   -Dental health: Discussed importance of regular tooth brushing, flossing, and dental visits.  -Health maintenance and immunizations reviewed. Please refer to Health maintenance section.  Return to care in 1 year for next preventative visit.     Subjective:  HPI:  He has no acute complaints today.   His main concern today is lower extremity pain and discomfort this has been going on for several months. He does have a known history of low back pain.  Gets worse with exertion that improves upon rest.  No obvious injuries or other precipitating events.  He has noticed more swelling in his lower extremities in the morning.  He has been following with cardiology recently.  Also recently had cataract surgery performed.     09/11/2022   12:55 PM  Depression screen PHQ 2/9  Decreased Interest 0  Down, Depressed, Hopeless 0  PHQ - 2 Score 0   ROS: Per HPI, otherwise a complete review of systems was negative.   PMH:  The following were reviewed and entered/updated in epic: Past Medical History:  Diagnosis Date   ALLERGIC RHINITIS 01/25/2010   Arthritis    "neck, back, shoulders," (02/20/2017)   Chronic lower back pain    COVID 06/17/2021   has lingering cough that subsding   High cholesterol    Hx of cardiovascular stress test    a. Lexiscan Myoview 02/06/13:  EF 59%, no ischemia or scar   Hx of echocardiogram    a.  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   Vitiligo    Patient Active Problem List   Diagnosis Date Noted   Migraine 12/06/2021   Persistent atrial fibrillation ([place]) 12/28/2020   Secondary hypercoagulable state ([place]) 12/28/2020   (HFpEF) heart failure with preserved ejection fraction ([place]) 09/08/2020   Stroke of unknown cause ([place]) 09/08/2020   Facial pain 10/09/2019   Dysphagia 09/29/2019   Prediabetes 09/19/2019   H/O Graves' disease 09/19/2019   Deviated septum 08/20/2018   BPH (benign prostatic hyperplasia) 08/20/2018   HLD (hyperlipidemia) 02/20/2017   History of CVA (cerebrovascular accident) 02/20/2017   Back pain, chronic 11/22/2015   Morbid obesity ([place]) 05/31/2015   MGUS (monoclonal gammopathy of unknown significance) 01/21/2015   OSA (obstructive sleep apnea) 03/16/2014   Essential hypertension 01/25/2010   Allergic rhinitis 01/25/2010   Past Surgical History:  Procedure Laterality Date   A-FLUTTER ABLATION N/A 04/23/2019   Procedure: A-FLUTTER ABLATION;  Surgeon: Deboraha Sprang, MD;  Location: [facility];  Service: Cardiovascular;  Laterality: N/A;   ATRIAL FIBRILLATION ABLATION N/A 03/30/2021   Procedure: ATRIAL FIBRILLATION ABLATION;  Surgeon: Constance Haw, MD;  Location: [facility];  Service: Cardiovascular;  Laterality: N/A;   BIOPSY  09/29/2019   Procedure: BIOPSY;  Surgeon: Thornton Park, MD;  Location: [facility];  Service: Gastroenterology;;   CARDIOVERSION N/A 08/14/2018   Procedure: [id number];  Surgeon: [name], MD;  Location: [facility] ENDOSCOPY;  Service: Cardiovascular;  Laterality: N/A;   CARDIOVERSION N/A 01/04/2021   Procedure: CARDIOVERSION;  Surgeon: Pixie Casino, MD;  Location: [facility] ENDOSCOPY;  Service: Cardiovascular;  Laterality: N/A;   ESOPHAGOGASTRODUODENOSCOPY N/A 09/29/2019   Procedure:  ESOPHAGOGASTRODUODENOSCOPY (EGD);  Surgeon: Thornton Park, MD;  Location: [facility];  Service: Gastroenterology;  Laterality: N/A;   INGUINAL HERNIA REPAIR Bilateral    LUMBAR LAMINECTOMY     TEE WITHOUT CARDIOVERSION N/A 08/14/2018   Procedure: TRANSESOPHAGEAL ECHOCARDIOGRAM (TEE);  Surgeon: [name], MD;  Location: [facility] ENDOSCOPY;  Service: Cardiovascular;  Laterality: N/A;   TEE WITHOUT CARDIOVERSION N/A 03/30/2021   Procedure: TRANSESOPHAGEAL ECHOCARDIOGRAM (TEE);  Surgeon: Skeet Latch, MD;  Location: [facility] ENDOSCOPY;  Service: Cardiovascular;  Laterality: N/A;   UMBILICAL HERNIA REPAIR      Family History  Problem Relation Age of Onset   Stroke Father        Passed away in his 97s   Thyroid disease Sister        goiter   Sudden death Brother        ? Drug use, no autopsy   Throat cancer Brother    Colon cancer Neg Hx    Esophageal cancer Neg Hx    Rectal cancer Neg Hx    Stomach cancer Neg Hx     Medications- reviewed and updated Current Outpatient Medications  Medication Sig Dispense Refill   acetaminophen (TYLENOL) 650 MG CR tablet Take 1,300 mg by mouth daily as needed for pain.     Aloe-Sodium Chloride (AYR SALINE NASAL GEL NA) Place 1 application  into the nose at bedtime as needed (congestion).     diltiazem (CARDIZEM CD) 120 MG 24 hr capsule TAKE 1 CAPSULE BY MOUTH EVERY DAY 90 capsule 3   doxazosin (CARDURA) 2 MG tablet TAKE 1 TABLET BY MOUTH AT BEDTIME. 90 tablet 2   ELIQUIS 5 MG TABS tablet TAKE 1 TABLET BY MOUTH TWICE A DAY 180 tablet 1   finasteride (PROSCAR) 5 MG tablet Take 5 mg by mouth daily.     furosemide (LASIX) 20 MG tablet Take 1 tablet (20 mg total) by mouth daily as needed. 90 tablet 3   hydrocortisone cream 1 % Apply 1 application topically 2 (two) times daily as needed for itching.     Lancets (ONETOUCH ULTRASOFT) lancets Use as instructed to check blood sugar once daily 100 each 3   losartan (COZAAR) 50 MG tablet TAKE 1 TABLET BY MOUTH  EVERY DAY 90 tablet 1   methocarbamol (ROBAXIN) 500 MG tablet SMARTSIG:1-2 Tablet(s) By Mouth 2-3 Times Daily PRN     metoprolol succinate (TOPROL-XL) 100 MG 24 hr tablet TAKE 1 TABLET (100 MG TOTAL) BY MOUTH AT BEDTIME. TAKE WITH OR IMMEDIATELY FOLLOWING A MEAL. 90 tablet 3   Misc Natural Products (PROSTATE THERAPY COMPLEX PO) Take 1 capsule by mouth in the morning. TruNature Prostate Plus Health Complex     ONETOUCH VERIO test strip USE AS INSTRUCTED TO CHECK BLOOD SUGAR ONCE DAILY 100 strip 3   OVER THE COUNTER MEDICATION Take 1 capsule by mouth at bedtime. OTC - Super Beta Prostate     pravastatin (PRAVACHOL) 40 MG tablet TAKE 1 TABLET BY MOUTH EVERYDAY AT BEDTIME 90 tablet 3   Prednisol Ace-Moxiflox-Bromfen 1-0.5-0.075 % SUSP Place 1 drop into the left eye 4 (four) times daily.     PRESCRIPTION MEDICATION Inhale into the lungs at bedtime. CPAP     sildenafil (VIAGRA) 100 MG tablet Take 100 mg by mouth daily as needed for erectile dysfunction.      sodium chloride (OCEAN) 0.65 % nasal spray Place 1 spray into the nose as needed for congestion.     vitamin C (ASCORBIC ACID) 500 MG tablet Take 500 mg by mouth 3 (three) times a week.     No current facility-administered medications for this visit.    Allergies-reviewed and updated Allergies  Allergen Reactions   Flomax [Tamsulosin Hcl] Itching   Relpax [Eletriptan] Other (See Comments) and Hypertension    Chest pain and sweating   Other Palpitations    Any steroids cause heart palpitations  (tolerates low doses)    Social History   Socioeconomic History   Marital status: Married    Spouse name: Not on file   Number of children: 2   Years of education: Not on file   Highest education level: Not on file  Occupational History   Occupation: Press photographer   Occupation: retired  Tobacco Use   Smoking status: Former    Packs/day: 0.50    Years: 8.00    Total pack years: 4.00    Types: Cigarettes    Quit date: 1968    Years since  quitting: 55.8   Smokeless tobacco: Never   Tobacco comments:    Former smoker 08/22/22  Vaping Use   Vaping Use: Never used  Substance and Sexual Activity   Alcohol use: Yes    Alcohol/week: 1.0 - 2.0 standard drink of alcohol    Types: 1 - 2 Shots of liquor per week    Comment: 02/20/2017 "couple drinks/month  on average"   Drug use: No   Sexual activity: Yes  Other Topics Concern   Not on file  Social History Narrative   Not on file   Social Determinants of Health   Financial Resource Strain: Low Risk  (08/21/2022)   Overall Financial Resource Strain (CARDIA)    Difficulty of Paying Living Expenses: Not hard at all  Food Insecurity: No Food Insecurity (08/21/2022)   Hunger Vital Sign    Worried About Running Out of Food in the Last Year: Never true    Ran Out of Food in the Last Year: Never true  Transportation Needs: No Transportation Needs (08/21/2022)   PRAPARE - Hydrologist (Medical): No    Lack of Transportation (Non-Medical): No  Physical Activity: Inactive (08/21/2022)   Exercise Vital Sign    Days of Exercise per Week: 0 days    Minutes of Exercise per Session: 0 min  Stress: No Stress Concern Present (08/21/2022)   [place]    Feeling of Stress : Not at all  Social Connections: Moderately Isolated (08/21/2022)   Social Connection and Isolation Panel [NHANES]    Frequency of Communication with Friends and Family: More than three times a week    Frequency of Social Gatherings with Friends and Family: More than three times a week    Attends Religious Services: Never    Marine scientist or Organizations: No    Attends Music therapist: Never    Marital Status: Married        Objective:  Physical Exam: BP 121/70   Pulse (!) 57   Temp 97.7 F (36.5 C) (Temporal)   Ht '6\' 1"'$  (1.854 m)   Wt 282 lb 3.2 oz (128 kg)   SpO2 97%   BMI 37.23 kg/m    Body mass index is 37.23 kg/m. Wt Readings from Last 3 Encounters:  09/11/22 282 lb 3.2 oz (128 kg)  09/04/22 280 lb (127 kg)  08/28/22 278 lb 1.6 oz (126.1 kg)   Gen: NAD, resting comfortably HEENT: TMs normal bilaterally. OP clear. No thyromegaly noted.  CV: RRR with no murmurs appreciated Pulm: NWOB, CTAB with no crackles, wheezes, or rhonchi GI: Normal bowel sounds present. Soft, Nontender, Nondistended. MSK: no edema, cyanosis, or clubbing noted Skin: warm, dry Neuro: CN2-12 grossly intact. Strength 5/5 in upper and lower extremities. Reflexes symmetric and intact bilaterally.  Psych: Normal affect and thought content     Brnadon Eoff M. [name], MD 09/11/2022 1:32 PM

## 2022-09-11 NOTE — Assessment & Plan Note (Signed)
Follows with orthopedics.  Currently on methocarbamol.  Has had a few steroid injections with some modest improvement.  Could be contributing to his bilateral leg pain as above.

## 2022-09-11 NOTE — Assessment & Plan Note (Signed)
At goal today.  We will continue management per cardiology.

## 2022-09-13 NOTE — Progress Notes (Signed)
Please inform patient of the following:  B12 is low.  Recommend he start the B12 protocol here.  Vitamin D is a little bit low as well.  Recommend he start 2000 IUs daily we can recheck this in 3 to 6 months.  A1c in the borderline diabetic range.  Recommend starting metformin XR 500 mg daily to improve blood sugar and lower risk of complication from diabetes.  Please send in a prescription if he is willing to start.  We should recheck this in 3 to 6 months.  Everything else is normal and we can recheck in a year.

## 2022-09-14 NOTE — Telephone Encounter (Signed)
Work up is complete for Ablation.   Pt has an appt with AT on 01/09/23 for H&P.  CT is scheduled for 01/15/23.

## 2022-09-18 ENCOUNTER — Other Ambulatory Visit: Payer: Self-pay | Admitting: Family Medicine

## 2022-10-02 ENCOUNTER — Ambulatory Visit: Payer: Medicare HMO | Admitting: Internal Medicine

## 2022-10-02 ENCOUNTER — Encounter: Payer: Self-pay | Admitting: Internal Medicine

## 2022-10-02 VITALS — BP 130/72 | HR 95 | Ht 73.0 in | Wt 284.2 lb

## 2022-10-02 DIAGNOSIS — Z8639 Personal history of other endocrine, nutritional and metabolic disease: Secondary | ICD-10-CM

## 2022-10-02 DIAGNOSIS — E1165 Type 2 diabetes mellitus with hyperglycemia: Secondary | ICD-10-CM | POA: Insufficient documentation

## 2022-10-02 DIAGNOSIS — E7849 Other hyperlipidemia: Secondary | ICD-10-CM

## 2022-10-02 MED ORDER — OZEMPIC (0.25 OR 0.5 MG/DOSE) 2 MG/3ML ~~LOC~~ SOPN: PEN_INJECTOR | SUBCUTANEOUS | 3 refills | Status: DC

## 2022-10-02 NOTE — Progress Notes (Signed)
Patient ID: Gordon Phillips, male   DOB: 11-Aug-1946, 76 y.o.   MRN: [id number]   HPI: Gordon Phillips is a 76 y.o.-year-old male, initially referred by his PCP, Dr. [name], returning for follow-up for prediabetes, dx in ~2013 and history of Graves' disease.  His wife, Gordon Phillips, is also my patient.  Last visit 6 months ago.  Interim history: No increased urination, blurry vision, nausea, chest pain.  He has back pain  - he had RFA of his back, but pain persists. He continues to have steroid inj's in back - these help. He gets them every 2 months. Before last visit, he stopped juice and other sweet drinks.He also reduced starches, no fried foods.  He recently had cataract surgery (1 mo ago) - he will have another one soon. His Afib recurred >> will have an ablation in 01/2023. He has SOB and weakness with exertion. He works 3 days a week.  Reviewed history: Patient has a history of impaired glucose tolerance/prediabetes, but he was found to have an HbA1c in the diabetic range at last check in 08/2019.   He tells me that he knows why his HbA1c was higher: he was working part-time, but stopped working at the start of the coronavirus pandemic and started to eat more.   After the HbA1c returned high >> he started to change his diet: reduced bread, stopped sweets, increased fruit and veggies. He also tried to restart exercise but hurt his back so he is not taking it slowly.  After the HbA1c returned higher, he was started on Metformin 500 mg daily by PCP but he could not tolerate it due to GI symptoms >> abdominal pain and diarrhea. He would like to avoid medications if possible.  Reviewed HbA1c levels: Lab Results  Component Value Date   HGBA1C 6.5 09/11/2022   HGBA1C 5.8 (A) 03/27/2022   HGBA1C 6.0 (A) 05/17/2021   HGBA1C 6.0 (A) 11/09/2020   HGBA1C 5.7 (A) 02/02/2020   HGBA1C 6.5 08/26/2019   HGBA1C 6.1 (H) 02/03/2013   HGBA1C 6.2 05/27/2012   He is not on any medication.  He is  currently not checking blood sugars.  Previously, - am: 80-90s - 2h after b'fast: n/c - lunch: n/c - 2h after lunch: n/c - dinner: n/c - 2h after dinner: n/c - bedtime: n/c It is unclear at which level he has hypoglycemia awareness.  Glucometer: One Touch verio  Pt's meals are: 2 meals a day now - varies: - Breakfast: Oatmeal + fruit (banana + grapes) 3x a week; eggs + grits + bacon/sausage; tuna sandwich or tuna salad; eggs + sardines + toast; granola bar >> more oatmeal + grapes + banana - Lunch: skips or peanuts or PB crackers; [employer] - Dinner:  Salmon or pork chop + veggies + rice or potatoes - Snacks: stopped icecream, juice + sodas, grapes, cookies  -+ Mild CKD, last BUN/creatinine:  Lab Results  Component Value Date   BUN 16 08/28/2022   BUN 12 07/17/2022   CREATININE 1.05 08/28/2022   CREATININE 0.88 07/17/2022  On valsartan.  -+ HL; last set of lipids: Lab Results  Component Value Date   CHOL 124 09/11/2022   HDL 50.20 09/11/2022   LDLCALC 60 09/11/2022   LDLDIRECT 139.2 11/21/2012   TRIG 69.0 09/11/2022   CHOLHDL 2 09/11/2022  On pravastatin 40.  - last eye exam was in 2023: No DR reportedly; + glaucoma.  - no numbness and tingling in his feet.  Pt has no FH  of DM.  He has a history of stroke (left basal ganglia infarct) in 01/2013 -previously on Eliquis, now off.  He also has MGUS, vitiligo, h/o pericarditis.  He has a history of Graves' disease, s/p RAI treatment in the 12s.  Review latest TFTs: Lab Results  Component Value Date   TSH 0.83 09/11/2022   TSH 0.71 03/27/2022   TSH 0.83 05/17/2021   TSH 0.773 09/29/2019   TSH 0.67 08/26/2019   He is on Toprol-XL.  Since last visit, he was found to have a low B12.  ROS: + See HPI  Past Medical History:  Diagnosis Date   ALLERGIC RHINITIS 01/25/2010   Arthritis    "neck, back, shoulders," (02/20/2017)   Chronic lower back pain    COVID 06/17/2021   has lingering cough that subsding    High cholesterol    Hx of cardiovascular stress test    a. [place] 02/06/13:  EF 59%, no ischemia or scar   Hx of echocardiogram    a.  [place] 02/03/13:  Mild LVH, mild focal basal septal hypertrophy, EF 50-55%, Gr 1 DD, mild LAE   HYPERTENSION 01/25/2010   HYPERTHYROIDISM 01/25/2010   S/P radioactive iodine "back in the 1980s" (02/20/2017)   Impaired glucose tolerance    MGUS (monoclonal gammopathy of unknown significance)    "an autoimmune thing"   Obesity    OSA on CPAP    Uses CPAP nightly   Pericarditis 1980's   Stroke ([place])    Old L basal ganglia infarct by CT 01/2013.   Vitiligo    Past Surgical History:  Procedure Laterality Date   A-FLUTTER ABLATION N/A 04/23/2019   Procedure: A-FLUTTER ABLATION;  Surgeon: Deboraha Sprang, MD;  Location: [facility];  Service: Cardiovascular;  Laterality: N/A;   ATRIAL FIBRILLATION ABLATION N/A 03/30/2021   Procedure: ATRIAL FIBRILLATION ABLATION;  Surgeon: Constance Haw, MD;  Location: [facility];  Service: Cardiovascular;  Laterality: N/A;   BIOPSY  09/29/2019   Procedure: BIOPSY;  Surgeon: Thornton Park, MD;  Location: [facility];  Service: Gastroenterology;;   CARDIOVERSION N/A 08/14/2018   Procedure: [id number];  Surgeon: [name], MD;  Location: [facility] ENDOSCOPY;  Service: Cardiovascular;  Laterality: N/A;   CARDIOVERSION N/A 01/04/2021   Procedure: CARDIOVERSION;  Surgeon: Pixie Casino, MD;  Location: [facility] ENDOSCOPY;  Service: Cardiovascular;  Laterality: N/A;   ESOPHAGOGASTRODUODENOSCOPY N/A 09/29/2019   Procedure: ESOPHAGOGASTRODUODENOSCOPY (EGD);  Surgeon: Thornton Park, MD;  Location: [facility];  Service: Gastroenterology;  Laterality: N/A;   INGUINAL HERNIA REPAIR Bilateral    LUMBAR LAMINECTOMY     TEE WITHOUT CARDIOVERSION N/A 08/14/2018   Procedure: TRANSESOPHAGEAL ECHOCARDIOGRAM (TEE);  Surgeon: [name], MD;  Location: [facility] ENDOSCOPY;  Service: Cardiovascular;  Laterality: N/A;    TEE WITHOUT CARDIOVERSION N/A 03/30/2021   Procedure: TRANSESOPHAGEAL ECHOCARDIOGRAM (TEE);  Surgeon: Skeet Latch, MD;  Location: [facility] ENDOSCOPY;  Service: Cardiovascular;  Laterality: N/A;   UMBILICAL HERNIA REPAIR     Social History   Socioeconomic History   Marital status: Married    Spouse name: Not on file   Number of children: 2   Years of education: Not on file   Highest education level: Not on file  Occupational History   Occupation: sales   Occupation: retired  Tobacco Use   Smoking status: Former    Packs/day: 0.50    Years: 8.00    Total pack years: 4.00    Types: Cigarettes  Quit date: 20    Years since quitting: 55.9   Smokeless tobacco: Never   Tobacco comments:    Former smoker 08/22/22  Vaping Use   Vaping Use: Never used  Substance and Sexual Activity   Alcohol use: Yes    Alcohol/week: 1.0 - 2.0 standard drink of alcohol    Types: 1 - 2 Shots of liquor per week    Comment: 02/20/2017 "couple drinks/month on average"   Drug use: No   Sexual activity: Yes  Other Topics Concern   Not on file  Social History Narrative   Not on file   Social Determinants of Health   Financial Resource Strain: Low Risk  (08/21/2022)   Overall Financial Resource Strain (CARDIA)    Difficulty of Paying Living Expenses: Not hard at all  Food Insecurity: No Food Insecurity (08/21/2022)   Hunger Vital Sign    Worried About Running Out of Food in the Last Year: Never true    Ran Out of Food in the Last Year: Never true  Transportation Needs: No Transportation Needs (08/21/2022)   PRAPARE - Hydrologist (Medical): No    Lack of Transportation (Non-Medical): No  Physical Activity: Inactive (08/21/2022)   Exercise Vital Sign    Days of Exercise per Week: 0 days    Minutes of Exercise per Session: 0 min  Stress: No Stress Concern Present (08/21/2022)   [place]    Feeling  of Stress : Not at all  Social Connections: Moderately Isolated (08/21/2022)   Social Connection and Isolation Panel [NHANES]    Frequency of Communication with Friends and Family: More than three times a week    Frequency of Social Gatherings with Friends and Family: More than three times a week    Attends Religious Services: Never    Marine scientist or Organizations: No    Attends Archivist Meetings: Never    Marital Status: Married  Human resources officer Violence: Not At Risk (08/21/2022)   Humiliation, Afraid, Rape, and Kick questionnaire    Fear of Current or Ex-Partner: No    Emotionally Abused: No    Physically Abused: No    Sexually Abused: No   Current Outpatient Medications on File Prior to Visit  Medication Sig Dispense Refill   acetaminophen (TYLENOL) 650 MG CR tablet Take 1,300 mg by mouth daily as needed for pain.     Aloe-Sodium Chloride (AYR SALINE NASAL GEL NA) Place 1 application into the nose at bedtime as needed (congestion).     diltiazem (CARDIZEM CD) 120 MG 24 hr capsule TAKE 1 CAPSULE BY MOUTH EVERY DAY 90 capsule 3   doxazosin (CARDURA) 2 MG tablet TAKE 1 TABLET BY MOUTH AT BEDTIME. 90 tablet 2   ELIQUIS 5 MG TABS tablet TAKE 1 TABLET BY MOUTH TWICE A DAY 180 tablet 1   finasteride (PROSCAR) 5 MG tablet Take 5 mg by mouth daily.     furosemide (LASIX) 20 MG tablet Take 1 tablet (20 mg total) by mouth daily as needed. 90 tablet 3   hydrocortisone cream 1 % Apply 1 application topically 2 (two) times daily as needed for itching.     Lancets (ONETOUCH ULTRASOFT) lancets Use as instructed to check blood sugar once daily 100 each 3   losartan (COZAAR) 50 MG tablet TAKE 1 TABLET BY MOUTH EVERY DAY 90 tablet 1   methocarbamol (ROBAXIN) 500 MG tablet SMARTSIG:1-2 Tablet(s) By  Mouth 2-3 Times Daily PRN     metoprolol succinate (TOPROL-XL) 100 MG 24 hr tablet TAKE 1 TABLET (100 MG TOTAL) BY MOUTH AT BEDTIME. TAKE WITH OR IMMEDIATELY FOLLOWING A MEAL. 90 tablet  3   Misc Natural Products (PROSTATE THERAPY COMPLEX PO) Take 1 capsule by mouth in the morning. TruNature Prostate Plus Health Complex     ONETOUCH VERIO test strip USE AS INSTRUCTED TO CHECK BLOOD SUGAR ONCE DAILY 100 strip 3   OVER THE COUNTER MEDICATION Take 1 capsule by mouth at bedtime. OTC - Super Beta Prostate     pravastatin (PRAVACHOL) 40 MG tablet TAKE 1 TABLET BY MOUTH EVERYDAY AT BEDTIME 90 tablet 3   Prednisol Ace-Moxiflox-Bromfen 1-0.5-0.075 % SUSP Place 1 drop into the left eye 4 (four) times daily.     PRESCRIPTION MEDICATION Inhale into the lungs at bedtime. CPAP     sildenafil (VIAGRA) 100 MG tablet Take 100 mg by mouth daily as needed for erectile dysfunction.      sodium chloride (OCEAN) 0.65 % nasal spray Place 1 spray into the nose as needed for congestion.     vitamin C (ASCORBIC ACID) 500 MG tablet Take 500 mg by mouth 3 (three) times a week.     No current facility-administered medications on file prior to visit.   Allergies  Allergen Reactions   Flomax [Tamsulosin Hcl] Itching   Relpax [Eletriptan] Other (See Comments) and Hypertension    Chest pain and sweating   Other Palpitations    Any steroids cause heart palpitations  (tolerates low doses)   Family History  Problem Relation Age of Onset   Stroke Father        Passed away in his 46s   Thyroid disease Sister        goiter   Sudden death Brother        ? Drug use, no autopsy   Throat cancer Brother    Colon cancer Neg Hx    Esophageal cancer Neg Hx    Rectal cancer Neg Hx    Stomach cancer Neg Hx    PE: Ht '6\' 1"'$  (1.854 m)   Wt 284 lb 3.2 oz (128.9 kg)   BMI 37.50 kg/m  Wt Readings from Last 3 Encounters:  10/02/22 284 lb 3.2 oz (128.9 kg)  09/11/22 282 lb 3.2 oz (128 kg)  09/04/22 280 lb (127 kg)   Constitutional: overweight, in NAD Eyes: EOMI, no exophthalmos ENT: no thyromegaly, no cervical lymphadenopathy Cardiovascular: RRR, No MRG Respiratory: CTA B Musculoskeletal: no  deformities, Skin: + vitiligo rash over arms and neck, legs Neurological: + tremor with outstretched left hand Diabetic Foot Exam - Simple   Simple Foot Form Diabetic Foot exam was performed with the following findings: Yes 10/02/2022  1:24 PM  Visual Inspection No deformities, no ulcerations, no other skin breakdown bilaterally: Yes Sensation Testing Intact to touch and monofilament testing bilaterally: Yes Pulse Check See comments: Yes Comments Pulses in the dorsalis pedis barely palpable B    ASSESSMENT: 1. DM2  2.  History of Graves' disease  3.  Hyperlipidemia  PLAN:  1. Patient with history of prediabetes, not on medication.  Since last visit, however, he was diagnosed with diabetes.  This was likely related to being sedentary due to back pain and also getting steroid injections.  He also gained weight-7 pounds since last visit. -HbA1c obtained at last visit was at goal, at 5.8%, but since then he had another HbA1c that was higher, earlier  this month, at 6.5%.  Since this is the second HbA1c higher than 6.4%, he now has a diagnosis of diabetes. -At last visit, he inquired about Ozempic.  I advised him that he did not qualify for this since he did not have diabetes, but may qualify to [facility].  He can definitely try this, but I advised him that I do not usually prescribe it only for weight loss.  He is reticent to try this class of medicines as he feels that there is not enough experience with him.  He decided to continue with his diet. -At last visit he was not checking blood sugars (we previously gave him a One Touch Verio flex meter) and I advised him to check every day or at least every other day. -At this visit, he is not checking blood sugars.  We discussed that he needs to start.  I advised him rotate the time checks.  Since he cannot exercise due to back pain and his weakness, and also since he already started to work on his diet, we discussed about possibly starting a GLP-1  receptor agonist now.  He agrees with this.  Discussed about starting at a low dose and increasing as tolerated.  Discussed about benefits and possible side effects.  - I suggested to:  Patient Instructions  Please start Ozempic 0.25 mg weekly in a.m. (for example on Sunday morning) x 4 weeks, then increase to 0.5 mg weekly in a.m. if no nausea or hypoglycemia.  Please start checking blood sugars 1x a day, rotate the check times.  Please return in 3 months with your sugar log.   - advised to check sugars at different times of the day - 1x a day, rotating check times - advised for yearly eye exams >> he is UTD - return to clinic in 3 months  2. H/o Graves' disease -No signs or symptoms of thyrotoxicosis -He is status post RAI treatment in the 84s -Latest TSH was normal earlier this month: Lab Results  Component Value Date   TSH 0.83 09/11/2022   3.  Hyperlipidemia -Reviewed his latest lipid panel from 09/2022: Fractions at goal: Lab Results  Component Value Date   CHOL 124 09/11/2022   HDL 50.20 09/11/2022   LDLCALC 60 09/11/2022   LDLDIRECT 139.2 11/21/2012   TRIG 69.0 09/11/2022   CHOLHDL 2 09/11/2022  -He continues on pravastatin 40 mg daily without side effects  Philemon Kingdom, MD PhD [facility] Endocrinology

## 2022-10-02 NOTE — Patient Instructions (Signed)
Please start Ozempic 0.25 mg weekly in a.m. (for example on Sunday morning) x 4 weeks, then increase to 0.5 mg weekly in a.m. if no nausea or hypoglycemia.  Please start checking blood sugars 1x a day, rotate the check times.  Please return in 3 months with your sugar log.

## 2022-10-03 ENCOUNTER — Telehealth: Payer: Self-pay | Admitting: Internal Medicine

## 2022-10-03 ENCOUNTER — Ambulatory Visit (INDEPENDENT_AMBULATORY_CARE_PROVIDER_SITE_OTHER): Payer: Medicare HMO

## 2022-10-03 DIAGNOSIS — M79605 Pain in left leg: Secondary | ICD-10-CM

## 2022-10-03 DIAGNOSIS — M79606 Pain in leg, unspecified: Secondary | ICD-10-CM

## 2022-10-03 DIAGNOSIS — M79604 Pain in right leg: Secondary | ICD-10-CM | POA: Diagnosis not present

## 2022-10-03 DIAGNOSIS — R7303 Prediabetes: Secondary | ICD-10-CM

## 2022-10-03 MED ORDER — ONETOUCH ULTRASOFT LANCETS MISC: 3 refills | Status: AC

## 2022-10-03 MED ORDER — ONETOUCH VERIO VI STRP: ORAL_STRIP | 3 refills | Status: AC

## 2022-10-03 MED ORDER — OZEMPIC (0.25 OR 0.5 MG/DOSE) 2 MG/3ML ~~LOC~~ SOPN: PEN_INJECTOR | SUBCUTANEOUS | 3 refills | Status: DC

## 2022-10-03 MED ORDER — ONETOUCH VERIO W/DEVICE KIT: PACK | 0 refills | Status: DC

## 2022-10-03 NOTE — Telephone Encounter (Signed)
Rx sent 

## 2022-10-03 NOTE — Telephone Encounter (Signed)
MEDICATION: 1)  Blood Glucose Monitoring Suppl (ONETOUCH VERIO FLEX SYSTEM) w/Device KIT   2)  onetouch ultrasoft Lancets (ONETOUCH ULTRASOFT) lancets  3)     Ozempic (0.25 or 0.5 MG/DOSE) Semaglutide,0.25 or 0.5MG/DOS, (OZEMPIC, 0.25 OR 0.5 MG/DOSE,) 2 MG/3ML SOPN  PHARMACY:  [facility] on [address] in [place], [place] CONTACTED [place]?  Yes  IS THIS A 90 DAY SUPPLY : Yes  IS PATIENT OUT OF MEDICATION: No  IF NOT; HOW MUCH IS LEFT:   LAST APPOINTMENT DATE: _0 /27/2023  NEXT APPOINTMENT DATE:_1 /27/2024  DO WE HAVE YOUR PERMISSION TO LEAVE A DETAILED MESSAGE?: Yes  OTHER COMMENTS: Patient's wife says that they are tired of dealing with [facility] and wants prescriptions sent to [facility] on [address] in [place], [place].   **Let patient know to contact pharmacy at the end of the day to make sure medication is ready. **  ** Please notify patient to allow 48-72 hours to process**  **Encourage patient to contact the pharmacy for refills or they can request refills through [facility]**

## 2022-10-04 ENCOUNTER — Telehealth: Payer: Self-pay

## 2022-10-04 NOTE — Telephone Encounter (Signed)
Patient Advocate Encounter   Received notification from [place] that prior authorization for Ozempic is required.   PA faxed to insurance [employer]  [id number] Status is pending       Joneen Boers, [place] Patient Advocate Specialist [facility] Patient Advocate Team Direct Number: [phone] Fax: [phone]

## 2022-10-05 NOTE — Progress Notes (Signed)
Please inform patient of the following:  His ultrasound shows that he has normal blood flow in both of his legs.  He does not have a blood flow issue.  Recommend he follow-up with orthopedics to discuss his leg pain.

## 2022-10-06 NOTE — Telephone Encounter (Signed)
Pharmacy Patient Advocate Encounter  Received fax for additional information needed for Ozempic PA (due by 10/07/22).   Completed fax and returned. Status still pending

## 2022-10-07 ENCOUNTER — Other Ambulatory Visit: Payer: Self-pay | Admitting: Family Medicine

## 2022-10-09 NOTE — Telephone Encounter (Signed)
Patient Advocate Encounter  Prior Authorization for Ozempic (0.25 or 0.5 MG/DOSE) Soln Pen-inj has been approved.    Key: [id number] Effective dates: 11/06/2021 through 11/06/2023  Approval letter attached to charts

## 2022-10-10 ENCOUNTER — Ambulatory Visit: Payer: Medicare HMO

## 2022-10-10 ENCOUNTER — Ambulatory Visit (INDEPENDENT_AMBULATORY_CARE_PROVIDER_SITE_OTHER): Payer: Medicare HMO | Admitting: Family Medicine

## 2022-10-10 ENCOUNTER — Encounter: Payer: Self-pay | Admitting: Family Medicine

## 2022-10-10 VITALS — BP 130/84 | HR 67 | Temp 98.4°F | Resp 16 | Ht 74.0 in | Wt 284.8 lb

## 2022-10-10 DIAGNOSIS — E559 Vitamin D deficiency, unspecified: Secondary | ICD-10-CM | POA: Diagnosis not present

## 2022-10-10 DIAGNOSIS — E538 Deficiency of other specified B group vitamins: Secondary | ICD-10-CM | POA: Diagnosis not present

## 2022-10-10 DIAGNOSIS — E1165 Type 2 diabetes mellitus with hyperglycemia: Secondary | ICD-10-CM | POA: Diagnosis not present

## 2022-10-10 MED ORDER — CYANOCOBALAMIN 1000 MCG/ML IJ SOLN
1000.0000 ug | Freq: Once | INTRAMUSCULAR | Status: AC
  Administered 2022-10-10: 1000 ug via INTRAMUSCULAR

## 2022-10-10 NOTE — Progress Notes (Signed)
   Gordon Phillips is a 76 y.o. male who presents today for an office visit.  Assessment/Plan:  Chronic Problems Addressed Today: Type 2 diabetes mellitus (HCC) A1c 6.5.  Started on Ozempic by endocrinology.  Will defer further management to them.  Vitamin D deficiency Vitamin D 22 on last blood draw.  He will start 2000 IUs once daily.  We will recheck in 3 to 6 months.  B12 deficiency Started on B12 replacement protocol here.  He will come back in 3 months to recheck his B12.  Hopefully will be able to transition to oral supplementation at that point.     Subjective:  HPI:  Patient here for follow-up.  Was seen about a month ago for annual physical.  As part of his labs he was found to have low B12 and low vitamin D.  His A1c was in the borderline range and he did start on Ozempic per endocrinology.       Objective:  Physical Exam: BP 130/84   Pulse 67   Temp 98.4 F (36.9 C) (Temporal)   Resp 16   Ht '6\' 2"'$  (1.88 m)   Wt 284 lb 12.8 oz (129.2 kg)   SpO2 97%   BMI 36.57 kg/m   Gen: No acute distress, resting comfortably Neuro: Grossly normal, moves all extremities Psych: Normal affect and thought content      Gordon Remo M. [name], MD 10/10/2022 2:49 PM

## 2022-10-10 NOTE — Patient Instructions (Signed)
It was very nice to see you today!  Please start vitamin D 2000 IUs once daily.  Come back in 3 months to recheck your B12 and vitamin D levels.  Take care, Dr [name]  PLEASE NOTE:  If you had any lab tests please let us know if you have not heard back within a few days. You may see your results on mychart before we have a chance to review them but we will give you a call once they are reviewed by [place]. If we ordered any referrals today, please let us know if you have not heard from their office within the next week.   Please try these tips to maintain a healthy lifestyle:  Eat at least 3 REAL meals and 1-2 snacks per day.  Aim for no more than 5 hours between eating.  If you eat breakfast, please do so within one hour of getting up.   Each meal should contain half fruits/vegetables, one quarter protein, and one quarter carbs (no bigger than a computer mouse)  Cut down on sweet beverages. This includes juice, soda, and sweet tea.   Drink at least 1 glass of water with each meal and aim for at least 8 glasses per day  Exercise at least 150 minutes every week.

## 2022-10-10 NOTE — Assessment & Plan Note (Signed)
A1c 6.5.  Started on Ozempic by endocrinology.  Will defer further management to them.

## 2022-10-10 NOTE — Assessment & Plan Note (Signed)
Started on B12 replacement protocol here.  He will come back in 3 months to recheck his B12.  Hopefully will be able to transition to oral supplementation at that point.

## 2022-10-10 NOTE — Assessment & Plan Note (Signed)
Vitamin D 22 on last blood draw.  He will start 2000 IUs once daily.  We will recheck in 3 to 6 months.

## 2022-10-24 ENCOUNTER — Ambulatory Visit (INDEPENDENT_AMBULATORY_CARE_PROVIDER_SITE_OTHER): Payer: Medicare HMO

## 2022-10-24 DIAGNOSIS — E538 Deficiency of other specified B group vitamins: Secondary | ICD-10-CM

## 2022-10-24 MED ORDER — CYANOCOBALAMIN 1000 MCG/ML IJ SOLN
1000.0000 ug | Freq: Once | INTRAMUSCULAR | Status: AC
  Administered 2022-10-24: 1000 ug via INTRAMUSCULAR

## 2022-10-24 NOTE — Progress Notes (Signed)
Pt received b12 in right deltoid, pt tolerated well. 

## 2022-10-31 ENCOUNTER — Ambulatory Visit (INDEPENDENT_AMBULATORY_CARE_PROVIDER_SITE_OTHER): Payer: Medicare HMO

## 2022-10-31 DIAGNOSIS — E538 Deficiency of other specified B group vitamins: Secondary | ICD-10-CM | POA: Diagnosis not present

## 2022-10-31 MED ORDER — CYANOCOBALAMIN 1000 MCG/ML IJ SOLN
1000.0000 ug | Freq: Once | INTRAMUSCULAR | Status: AC
  Administered 2022-10-31: 1000 ug via INTRAMUSCULAR

## 2022-10-31 NOTE — Progress Notes (Signed)
Pt received b12 in right deltoid, pt tolerated well. 

## 2022-11-07 ENCOUNTER — Ambulatory Visit (INDEPENDENT_AMBULATORY_CARE_PROVIDER_SITE_OTHER): Payer: Medicare HMO

## 2022-11-07 DIAGNOSIS — E538 Deficiency of other specified B group vitamins: Secondary | ICD-10-CM | POA: Diagnosis not present

## 2022-11-07 MED ORDER — CYANOCOBALAMIN 1000 MCG/ML IJ SOLN
1000.0000 ug | Freq: Once | INTRAMUSCULAR | Status: AC
  Administered 2022-11-07: 1000 ug via INTRAMUSCULAR

## 2022-11-07 NOTE — Progress Notes (Signed)
Pt was here for b12 injection, injection was administered into right deltoid, pt tolerated well

## 2022-11-27 ENCOUNTER — Ambulatory Visit: Payer: Medicare HMO | Admitting: Family Medicine

## 2022-12-12 ENCOUNTER — Ambulatory Visit (INDEPENDENT_AMBULATORY_CARE_PROVIDER_SITE_OTHER): Payer: Medicare HMO

## 2022-12-12 DIAGNOSIS — E538 Deficiency of other specified B group vitamins: Secondary | ICD-10-CM

## 2022-12-12 LAB — VITAMIN B12: Vitamin B-12: >1500 pg/mL — ABNORMAL HIGH (ref 211–911)

## 2022-12-12 MED ORDER — CYANOCOBALAMIN 1000 MCG/ML IJ SOLN
1000.0000 ug | Freq: Once | INTRAMUSCULAR | Status: AC
  Administered 2022-12-12: 1000 ug via INTRAMUSCULAR

## 2022-12-12 NOTE — Progress Notes (Signed)
Gordon Phillips 77 yr old male presents to office today for 1st of 3 monthly B12 injections per Dimas Chyle, MD. B12 levels drawn prior to injection. Administered CYANOBALAMIN 1,000 mcg IM right arm. Patient tolerated well. Aware to return in 1 month for next injection.

## 2022-12-14 NOTE — Progress Notes (Signed)
Please inform patient of the following:  His B12 level has returned back to normal.  Can we please verify if he got his blood draw before or after his injection from a couple of days ago?  It would be okay for him to transition to oral B12 1000 mcg daily at this point.  We can recheck again in 3 to 6 months.

## 2022-12-25 ENCOUNTER — Other Ambulatory Visit: Payer: Self-pay | Admitting: Family Medicine

## 2022-12-25 ENCOUNTER — Other Ambulatory Visit: Payer: Self-pay | Admitting: Cardiology

## 2022-12-25 DIAGNOSIS — I4819 Other persistent atrial fibrillation: Secondary | ICD-10-CM

## 2023-01-02 ENCOUNTER — Ambulatory Visit: Payer: Medicare HMO | Admitting: Internal Medicine

## 2023-01-02 ENCOUNTER — Encounter: Payer: Self-pay | Admitting: Internal Medicine

## 2023-01-02 VITALS — BP 130/84 | HR 97 | Ht 74.0 in | Wt 271.6 lb

## 2023-01-02 DIAGNOSIS — E1165 Type 2 diabetes mellitus with hyperglycemia: Secondary | ICD-10-CM | POA: Diagnosis not present

## 2023-01-02 DIAGNOSIS — E7849 Other hyperlipidemia: Secondary | ICD-10-CM | POA: Diagnosis not present

## 2023-01-02 DIAGNOSIS — Z8639 Personal history of other endocrine, nutritional and metabolic disease: Secondary | ICD-10-CM | POA: Diagnosis not present

## 2023-01-02 LAB — POCT GLYCOSYLATED HEMOGLOBIN (HGB A1C): Hemoglobin A1C: 5.7 % — AB (ref 4.0–5.6)

## 2023-01-02 NOTE — Patient Instructions (Addendum)
Please continue: - Ozempic 0.5 mg weekly  Please return in 6 months with your sugar log.

## 2023-01-02 NOTE — Progress Notes (Signed)
Patient ID: Gordon Phillips, male   DOB: 1945/11/08, 77 y.o.   MRN: [id number]   HPI: Gordon Phillips is a 77 y.o.-year-old male, initially referred by his PCP, Dr. [name], returning for follow-up for prediabetes, dx in ~2013 and history of Graves' disease.  His wife, Gordon Phillips, is also my patient.  Last visit 3 months ago.  Interim history: No increased urination, blurry vision, nausea, chest pain.  He has back pain  - he had RFA of his back, but pain persists. He continues to have steroid inj's in back - these help. He gets them every 2 months. His Afib recurred >> may need to have an ablation in 01/2023. He had SOB at last visit >> much better. He lost 13 lbs since last OV. He cut down portions, reduced sweets. He increased his water intake. He was found to have a low vitamin B12 and D - on supplementation.  He feels less fatigued.  Reviewed history: Patient has a history of impaired glucose tolerance/prediabetes, but he was found to have an HbA1c in the diabetic range at last check in 08/2019.   He tells me that he knows why his HbA1c was higher: he was working part-time, but stopped working at the start of the coronavirus pandemic and started to eat more.   After the HbA1c returned high >> he started to change his diet: reduced bread, stopped sweets, increased fruit and veggies. He also tried to restart exercise but hurt his back so he is not taking it slowly.  After the HbA1c returned higher, he was started on Metformin 500 mg daily by PCP but he could not tolerate it due to GI symptoms >> abdominal pain and diarrhea.   Reviewed HbA1c levels: Lab Results  Component Value Date   HGBA1C 6.5 09/11/2022   HGBA1C 5.8 (A) 03/27/2022   HGBA1C 6.0 (A) 05/17/2021   HGBA1C 6.0 (A) 11/09/2020   HGBA1C 5.7 (A) 02/02/2020   HGBA1C 6.5 08/26/2019   HGBA1C 6.1 (H) 02/03/2013   HGBA1C 6.2 05/27/2012   He is on: - Ozempic 0.25 >> 0.5 mg weekly - started 09/2022  He checks sugars once a day: -  am: 80-90s >> 80-100 - 2h after b'fast: n/c - lunch: n/c - 2h after lunch: n/c - dinner: n/c - 2h after dinner: n/c - bedtime: n/c It is unclear at which level he has hypoglycemia awareness.  Glucometer: One Touch verio  Pt's meals are: 2 meals a day now - varies: - Breakfast: Oatmeal + fruit (banana + grapes) 3x a week; eggs + grits + bacon/sausage; tuna sandwich or tuna salad; eggs + sardines + toast; granola bar >> more oatmeal + grapes + banana - Lunch: skips or peanuts or PB crackers; [employer] - Dinner:  Salmon or pork chop + veggies + rice or potatoes - Snacks: stopped icecream, juice + sodas, grapes, cookies  -+ Mild CKD, last BUN/creatinine:  Lab Results  Component Value Date   BUN 16 08/28/2022   BUN 12 07/17/2022   CREATININE 1.05 08/28/2022   CREATININE 0.88 07/17/2022  On valsartan.  -+ HL; last set of lipids: Lab Results  Component Value Date   CHOL 124 09/11/2022   HDL 50.20 09/11/2022   LDLCALC 60 09/11/2022   LDLDIRECT 139.2 11/21/2012   TRIG 69.0 09/11/2022   CHOLHDL 2 09/11/2022  On pravastatin 40.  - last eye exam was in 2023: No DR reportedly; + glaucoma. Had cataract surgeries since last OV.  - no numbness and tingling  in his feet.  Last foot exam 10/02/2022.  Pt has no FH of DM.  He has a history of stroke (left basal ganglia infarct) in 01/2013 -previously on Eliquis, now off.  He also has MGUS, vitiligo, h/o pericarditis.  He has a history of Graves' disease, s/p RAI treatment in the 38s.  Review latest TFTs: Lab Results  Component Value Date   TSH 0.83 09/11/2022   TSH 0.71 03/27/2022   TSH 0.83 05/17/2021   TSH 0.773 09/29/2019   TSH 0.67 08/26/2019   He is on Toprol-XL.  Since last visit, he was found to have a low B12.  ROS: + See HPI  Past Medical History:  Diagnosis Date   ALLERGIC RHINITIS 01/25/2010   Arthritis    "neck, back, shoulders," (02/20/2017)   Chronic lower back pain    COVID 06/17/2021   has lingering  cough that subsding   High cholesterol    Hx of cardiovascular stress test    a. [place] 02/06/13:  EF 59%, no ischemia or scar   Hx of echocardiogram    a.  [place] 02/03/13:  Mild LVH, mild focal basal septal hypertrophy, EF 50-55%, Gr 1 DD, mild LAE   HYPERTENSION 01/25/2010   HYPERTHYROIDISM 01/25/2010   S/P radioactive iodine "back in the 1980s" (02/20/2017)   Impaired glucose tolerance    MGUS (monoclonal gammopathy of unknown significance)    "an autoimmune thing"   Obesity    OSA on CPAP    Uses CPAP nightly   Pericarditis 1980's   Stroke ([place])    Old L basal ganglia infarct by CT 01/2013.   Vitiligo    Past Surgical History:  Procedure Laterality Date   A-FLUTTER ABLATION N/A 04/23/2019   Procedure: A-FLUTTER ABLATION;  Surgeon: Deboraha Sprang, MD;  Location: [facility];  Service: Cardiovascular;  Laterality: N/A;   ATRIAL FIBRILLATION ABLATION N/A 03/30/2021   Procedure: ATRIAL FIBRILLATION ABLATION;  Surgeon: Constance Haw, MD;  Location: [facility];  Service: Cardiovascular;  Laterality: N/A;   BIOPSY  09/29/2019   Procedure: BIOPSY;  Surgeon: Thornton Park, MD;  Location: [facility];  Service: Gastroenterology;;   CARDIOVERSION N/A 08/14/2018   Procedure: CARDIOVERSION;  Surgeon: [name], MD;  Location: [facility] ENDOSCOPY;  Service: Cardiovascular;  Laterality: N/A;   CARDIOVERSION N/A 01/04/2021   Procedure: CARDIOVERSION;  Surgeon: Pixie Casino, MD;  Location: [facility] ENDOSCOPY;  Service: Cardiovascular;  Laterality: N/A;   CATARACT EXTRACTION Left 09/2022   ESOPHAGOGASTRODUODENOSCOPY N/A 09/29/2019   Procedure: ESOPHAGOGASTRODUODENOSCOPY (EGD);  Surgeon: Thornton Park, MD;  Location: [facility];  Service: Gastroenterology;  Laterality: N/A;   INGUINAL HERNIA REPAIR Bilateral    LUMBAR LAMINECTOMY     TEE WITHOUT CARDIOVERSION N/A 08/14/2018   Procedure: TRANSESOPHAGEAL ECHOCARDIOGRAM (TEE);  Surgeon: [name], MD;   Location: [facility] ENDOSCOPY;  Service: Cardiovascular;  Laterality: N/A;   TEE WITHOUT CARDIOVERSION N/A 03/30/2021   Procedure: TRANSESOPHAGEAL ECHOCARDIOGRAM (TEE);  Surgeon: Skeet Latch, MD;  Location: [facility] ENDOSCOPY;  Service: Cardiovascular;  Laterality: N/A;   UMBILICAL HERNIA REPAIR     Social History   Socioeconomic History   Marital status: Married    Spouse name: Not on file   Number of children: 2   Years of education: Not on file   Highest education level: Not on file  Occupational History   Occupation: sales   Occupation: retired  Tobacco Use   Smoking status: Former    Packs/day: 0.50  Years: 8.00    Total pack years: 4.00    Types: Cigarettes    Quit date: 60    Years since quitting: 56.1   Smokeless tobacco: Never   Tobacco comments:    Former smoker 08/22/22  Vaping Use   Vaping Use: Never used  Substance and Sexual Activity   Alcohol use: Yes    Alcohol/week: 1.0 - 2.0 standard drink of alcohol    Types: 1 - 2 Shots of liquor per week    Comment: 02/20/2017 "couple drinks/month on average"   Drug use: No   Sexual activity: Yes  Other Topics Concern   Not on file  Social History Narrative   Not on file   Social Determinants of Health   Financial Resource Strain: Low Risk  (08/21/2022)   Overall Financial Resource Strain (CARDIA)    Difficulty of Paying Living Expenses: Not hard at all  Food Insecurity: No Food Insecurity (08/21/2022)   Hunger Vital Sign    Worried About Running Out of Food in the Last Year: Never true    Ran Out of Food in the Last Year: Never true  Transportation Needs: No Transportation Needs (08/21/2022)   PRAPARE - Hydrologist (Medical): No    Lack of Transportation (Non-Medical): No  Physical Activity: Inactive (08/21/2022)   Exercise Vital Sign    Days of Exercise per Week: 0 days    Minutes of Exercise per Session: 0 min  Stress: No Stress Concern Present (08/21/2022)   [place]    Feeling of Stress : Not at all  Social Connections: Moderately Isolated (08/21/2022)   Social Connection and Isolation Panel [NHANES]    Frequency of Communication with Friends and Family: More than three times a week    Frequency of Social Gatherings with Friends and Family: More than three times a week    Attends Religious Services: Never    Marine scientist or Organizations: No    Attends Archivist Meetings: Never    Marital Status: Married  Human resources officer Violence: Not At Risk (08/21/2022)   Humiliation, Afraid, Rape, and Kick questionnaire    Fear of Current or Ex-Partner: No    Emotionally Abused: No    Physically Abused: No    Sexually Abused: No   Current Outpatient Medications on File Prior to Visit  Medication Sig Dispense Refill   acetaminophen (TYLENOL) 650 MG CR tablet Take 1,300 mg by mouth daily as needed for pain.     Aloe-Sodium Chloride (AYR SALINE NASAL GEL NA) Place 1 application into the nose at bedtime as needed (congestion).     Blood Glucose Monitoring Suppl (ONETOUCH VERIO) w/Device KIT Use as instructed to check blood sugar daily. 1 kit 0   diltiazem (CARDIZEM CD) 120 MG 24 hr capsule TAKE 1 CAPSULE BY MOUTH EVERY DAY 90 capsule 3   doxazosin (CARDURA) 2 MG tablet TAKE 1 TABLET BY MOUTH EVERYDAY AT BEDTIME 90 tablet 2   ELIQUIS 5 MG TABS tablet TAKE 1 TABLET BY MOUTH TWICE A DAY 180 tablet 1   finasteride (PROSCAR) 5 MG tablet Take 5 mg by mouth daily.     furosemide (LASIX) 20 MG tablet Take 1 tablet (20 mg total) by mouth daily as needed. 90 tablet 3   glucose blood (ONETOUCH VERIO) test strip Use as instructed 100 strip 3   hydrocortisone cream 1 % Apply 1 application topically 2 (two)  times daily as needed for itching.     Lancets (ONETOUCH ULTRASOFT) lancets Use as instructed to check blood sugar once daily 100 each 3   losartan (COZAAR) 50 MG tablet TAKE 1 TABLET BY MOUTH  EVERY DAY 90 tablet 1   methocarbamol (ROBAXIN) 500 MG tablet SMARTSIG:1-2 Tablet(s) By Mouth 2-3 Times Daily PRN     metoprolol succinate (TOPROL-XL) 100 MG 24 hr tablet TAKE 1 TABLET (100 MG TOTAL) BY MOUTH AT BEDTIME. TAKE WITH OR IMMEDIATELY FOLLOWING A MEAL. 90 tablet 3   Misc Natural Products (PROSTATE THERAPY COMPLEX PO) Take 1 capsule by mouth in the morning. TruNature Prostate Plus Health Complex     OVER THE COUNTER MEDICATION Take 1 capsule by mouth at bedtime. OTC - Super Beta Prostate     pravastatin (PRAVACHOL) 40 MG tablet TAKE 1 TABLET BY MOUTH EVERYDAY AT BEDTIME 90 tablet 3   Prednisol Ace-Moxiflox-Bromfen 1-0.5-0.075 % SUSP Place 1 drop into the left eye 4 (four) times daily.     PRESCRIPTION MEDICATION Inhale into the lungs at bedtime. CPAP     Semaglutide,0.25 or 0.'5MG'$ /DOS, (OZEMPIC, 0.25 OR 0.5 MG/DOSE,) 2 MG/3ML SOPN Inject under skin 0.5 mg weekly 9 mL 3   sildenafil (VIAGRA) 100 MG tablet Take 100 mg by mouth daily as needed for erectile dysfunction.      sodium chloride (OCEAN) 0.65 % nasal spray Place 1 spray into the nose as needed for congestion.     vitamin C (ASCORBIC ACID) 500 MG tablet Take 500 mg by mouth 3 (three) times a week.     No current facility-administered medications on file prior to visit.   Allergies  Allergen Reactions   Flomax [Tamsulosin Hcl] Itching   Relpax [Eletriptan] Other (See Comments) and Hypertension    Chest pain and sweating   Other Palpitations    Any steroids cause heart palpitations  (tolerates low doses)   Family History  Problem Relation Age of Onset   Stroke Father        Passed away in his 37s   Thyroid disease Sister        goiter   Sudden death Brother        ? Drug use, no autopsy   Throat cancer Brother    Colon cancer Neg Hx    Esophageal cancer Neg Hx    Rectal cancer Neg Hx    Stomach cancer Neg Hx    PE: BP 130/84 (BP Location: Left Arm, Patient Position: Sitting, Cuff Size: Normal)   Pulse 97   Ht 6'  2" (1.88 m)   Wt 271 lb 9.6 oz (123.2 kg)   SpO2 99%   BMI 34.87 kg/m  Wt Readings from Last 3 Encounters:  01/02/23 271 lb 9.6 oz (123.2 kg)  10/10/22 284 lb 12.8 oz (129.2 kg)  10/02/22 284 lb 3.2 oz (128.9 kg)   Constitutional: overweight, in NAD Eyes: EOMI, no exophthalmos ENT: no thyromegaly, no cervical lymphadenopathy Cardiovascular: tachycardia, RR, No MRG Respiratory: CTA B Musculoskeletal: no deformities, Skin: + vitiligo rash over arms and neck, legs Neurological: + tremor with outstretched left hand  ASSESSMENT: 1. DM2, non-insulin-dependent  2.  History of Graves' disease  3.  Hyperlipidemia  PLAN:  1. Patient with history of prediabetes, but diagnosed with diabetes before last visit, possibly due to being sedentary due to back pain and also getting steroid injections.  HbA1c at last visit was 6.5%.  At that time, I advised him to try to start a  GLP-1 receptor agonist to both help with his diabetes and obesity.  Ozempic was approved for him since then.  I also advised him to try to blood sugars every day and send a prescription for the One Touch Verio flex meter to his pharmacy. -At today's visit, sugars are all at goal, on Ozempic, which he is not tolerating very well.  He lost a significant amount of weight since last visit, 13 pounds.  Overall, he is feeling much better than at last visit, also after he started to improve his diet, increase hydration, and started supplementation for vitamin B12 and D deficiency. -At today's visit, we will continue the same Ozempic dose - I suggested to:  Patient Instructions  Please continue: - Ozempic 0.5 mg weekly  Please return in 6 months with your sugar log.   - we checked his HbA1c: 5.7% (lower, excellent) - advised to check sugars at different times of the day - 1x a day, rotating check times - advised for yearly eye exams >> he is UTD - return to clinic in 6 months  2. H/o Graves' disease -No signs or symptoms of  thyrotoxicosis -He is status post RAI treatment in the 62s -Latest TSH was normal: Lab Results  Component Value Date   TSH 0.83 09/11/2022  -Will continue to monitor his TFTs -will repeat these at next visit  3.  Hyperlipidemia -Reviewed his latest lipid panel from 09/2022: Fractions at goal: Lab Results  Component Value Date   CHOL 124 09/11/2022   HDL 50.20 09/11/2022   LDLCALC 60 09/11/2022   LDLDIRECT 139.2 11/21/2012   TRIG 69.0 09/11/2022   CHOLHDL 2 09/11/2022  -He continues on pravastatin 40 mg daily without side effects  Philemon Kingdom, MD PhD [facility] Endocr[facility]ology

## 2023-01-08 NOTE — H&P (View-Only) (Signed)
  Cardiology Office Note:   Date:  01/09/2023  ID:  [name], DOB 02/26/1946, MRN [id number]  Primary Cardiologist: Buford Dresser, MD Electrophysiologist: Constance Haw, MD   History of Present Illness:   Gordon Phillips is a 77 y.o. male AF s/p ablation 2022, AFL, HTN, HLD, OSA, seen today for routine electrophysiology followup prior to AF ablation. Since last being seen in our clinic the patient reports doing well overall.  he denies chest pain, palpitations, dyspnea, PND, orthopnea, nausea, vomiting, dizziness, syncope, edema, weight gain, or early satiety.   Review of systems complete and found to be negative unless listed in HPI.    Studies Reviewed:    EKG is ordered today. Personal review shows AT vs AFL with controlled rate   Risk Assessment/Calculations:     Physical Exam:   VS:  BP 135/88   Pulse 90   Ht '6\' 2"'$  (1.88 m)   Wt 269 lb 12.8 oz (122.4 kg)   SpO2 96%   BMI 34.64 kg/m    Wt Readings from Last 3 Encounters:  01/09/23 269 lb 12.8 oz (122.4 kg)  01/02/23 271 lb 9.6 oz (123.2 kg)  10/10/22 284 lb 12.8 oz (129.2 kg)     GEN: Well nourished, well developed in no acute distress NECK: No JVD; No carotid bruits CARDIAC: irregularly irregular, no murmurs, rubs, gallops RESPIRATORY:  Clear to auscultation without rales, wheezing or rhonchi  ABDOMEN: Soft, non-tender, non-distended EXTREMITIES:  No edema; No deformity   ASSESSMENT AND PLAN:   Persistent atrial fib Persistent atrial flutter S/p ablation 03/2021 EKG today shows likely atrial tachycardia vs flutter with V rate of 85 CHA2DS2VASc  is at least 5. Symptomatic in atrial flutter and would prefer to avoid medication avoidance He understands he has multiple atrial flutter circuits and ablation success would be variable, and that he would likely benefit from AAD. He is willing to consider hospital admission for Tikosyn if ablation shows multiple circuits. Reviewed risks and benefits of ablation.  Questions answered  Obesity Body mass index is 34.64 kg/m.  Encouraged lifestyle modification  OSA  Encouraged nightly CPAP   HTN Stable on current regimen    Follow up with Dr. Curt Bears as usual post procedure  Signed, Shirley Friar, PA-C

## 2023-01-08 NOTE — Progress Notes (Unsigned)
  Cardiology Office Note:   Date:  01/09/2023  ID:  [name], DOB 02/26/1946, MRN [id number]  Primary Cardiologist: Buford Dresser, MD Electrophysiologist: Constance Haw, MD   History of Present Illness:   Gordon Phillips is a 77 y.o. male AF s/p ablation 2022, AFL, HTN, HLD, OSA, seen today for routine electrophysiology followup prior to AF ablation. Since last being seen in our clinic the patient reports doing well overall.  he denies chest pain, palpitations, dyspnea, PND, orthopnea, nausea, vomiting, dizziness, syncope, edema, weight gain, or early satiety.   Review of systems complete and found to be negative unless listed in HPI.    Studies Reviewed:    EKG is ordered today. Personal review shows AT vs AFL with controlled rate   Risk Assessment/Calculations:     Physical Exam:   VS:  BP 135/88   Pulse 90   Ht '6\' 2"'$  (1.88 m)   Wt 269 lb 12.8 oz (122.4 kg)   SpO2 96%   BMI 34.64 kg/m    Wt Readings from Last 3 Encounters:  01/09/23 269 lb 12.8 oz (122.4 kg)  01/02/23 271 lb 9.6 oz (123.2 kg)  10/10/22 284 lb 12.8 oz (129.2 kg)     GEN: Well nourished, well developed in no acute distress NECK: No JVD; No carotid bruits CARDIAC: irregularly irregular, no murmurs, rubs, gallops RESPIRATORY:  Clear to auscultation without rales, wheezing or rhonchi  ABDOMEN: Soft, non-tender, non-distended EXTREMITIES:  No edema; No deformity   ASSESSMENT AND PLAN:   Persistent atrial fib Persistent atrial flutter S/p ablation 03/2021 EKG today shows likely atrial tachycardia vs flutter with V rate of 85 CHA2DS2VASc  is at least 5. Symptomatic in atrial flutter and would prefer to avoid medication avoidance He understands he has multiple atrial flutter circuits and ablation success would be variable, and that he would likely benefit from AAD. He is willing to consider hospital admission for Tikosyn if ablation shows multiple circuits. Reviewed risks and benefits of ablation.  Questions answered  Obesity Body mass index is 34.64 kg/m.  Encouraged lifestyle modification  OSA  Encouraged nightly CPAP   HTN Stable on current regimen    Follow up with Dr. Curt Bears as usual post procedure  Signed, Shirley Friar, PA-C

## 2023-01-09 ENCOUNTER — Ambulatory Visit: Payer: Medicare HMO | Admitting: Student

## 2023-01-09 ENCOUNTER — Ambulatory Visit: Payer: Medicare HMO | Attending: Student

## 2023-01-09 ENCOUNTER — Ambulatory Visit: Payer: Medicare HMO

## 2023-01-09 VITALS — BP 135/88 | HR 90 | Ht 74.0 in | Wt 269.8 lb

## 2023-01-09 DIAGNOSIS — I484 Atypical atrial flutter: Secondary | ICD-10-CM | POA: Diagnosis not present

## 2023-01-09 DIAGNOSIS — D6869 Other thrombophilia: Secondary | ICD-10-CM

## 2023-01-09 DIAGNOSIS — I10 Essential (primary) hypertension: Secondary | ICD-10-CM

## 2023-01-09 DIAGNOSIS — G4730 Sleep apnea, unspecified: Secondary | ICD-10-CM

## 2023-01-09 DIAGNOSIS — I4891 Unspecified atrial fibrillation: Secondary | ICD-10-CM

## 2023-01-09 NOTE — Patient Instructions (Signed)
Medication Instructions:  Your physician recommends that you continue on your current medications as directed. Please refer to the Current Medication list given to you today.  *If you need a refill on your cardiac medications before your next appointment, please call your pharmacy*   Lab Work: BMET and CBC today If you have labs (blood work) drawn today and your tests are completely normal, you will receive your results only by: [place] (if you have MyChart) OR A paper copy in the mail If you have any lab test that is abnormal or we need to change your treatment, we will call you to review the results.   Follow-Up: At [facility], you and your health needs are our priority.  As part of our continuing mission to provide you with exceptional heart care, we have created designated Provider Care Teams.  These Care Teams include your primary Cardiologist (physician) and Advanced Practice Providers (APPs -  Physician Assistants and Nurse Practitioners) who all work together to provide you with the care you need, when you need it.  Your next appointment:   04/27/23 at 10:45 AM  Provider:   Allegra Lai, MD

## 2023-01-10 LAB — CBC
Hematocrit: 40.6 % (ref 37.5–51.0)
Hemoglobin: 13.8 g/dL (ref 13.0–17.7)
MCH: 30.9 pg (ref 26.6–33.0)
MCHC: 34 g/dL (ref 31.5–35.7)
MCV: 91 fL (ref 79–97)
Platelets: 149 10*3/uL — ABNORMAL LOW (ref 150–450)
RBC: 4.46 x10E6/uL (ref 4.14–5.80)
RDW: 12.8 % (ref 11.6–15.4)
WBC: 5.7 10*3/uL (ref 3.4–10.8)

## 2023-01-10 LAB — BASIC METABOLIC PANEL
BUN/Creatinine Ratio: 12 (ref 10–24)
BUN: 13 mg/dL (ref 8–27)
CO2: 22 mmol/L (ref 20–29)
Calcium: 9.7 mg/dL (ref 8.6–10.2)
Chloride: 104 mmol/L (ref 96–106)
Creatinine, Ser: 1.09 mg/dL (ref 0.76–1.27)
Glucose: 122 mg/dL — ABNORMAL HIGH (ref 70–99)
Potassium: 4.3 mmol/L (ref 3.5–5.2)
Sodium: 141 mmol/L (ref 134–144)
eGFR: 71 mL/min/{1.73_m2} (ref >59)

## 2023-01-12 ENCOUNTER — Telehealth (HOSPITAL_COMMUNITY): Payer: Self-pay | Admitting: *Deleted

## 2023-01-12 NOTE — Telephone Encounter (Signed)
Attempted to call patient regarding upcoming cardiac CT appointment. °Left message on voicemail with name and callback number ° °Alin Hutchins RN Navigator Cardiac Imaging °[facility] °[phone] Office °[phone] Cell ° °

## 2023-01-15 ENCOUNTER — Ambulatory Visit (HOSPITAL_BASED_OUTPATIENT_CLINIC_OR_DEPARTMENT_OTHER)
Admission: RE | Admit: 2023-01-15 | Discharge: 2023-01-15 | Disposition: A | Discharge status: Home / Self Care | Payer: Medicare HMO | Source: Ambulatory Visit | Attending: Cardiology | Admitting: Cardiology

## 2023-01-15 ENCOUNTER — Encounter (HOSPITAL_BASED_OUTPATIENT_CLINIC_OR_DEPARTMENT_OTHER): Payer: Self-pay

## 2023-01-15 DIAGNOSIS — I4891 Unspecified atrial fibrillation: Secondary | ICD-10-CM | POA: Insufficient documentation

## 2023-01-15 MED ORDER — IOHEXOL 350 MG/ML SOLN
100.0000 mL | Freq: Once | INTRAVENOUS | Status: AC | PRN
  Administered 2023-01-15: 80 mL via INTRAVENOUS

## 2023-01-16 ENCOUNTER — Ambulatory Visit (INDEPENDENT_AMBULATORY_CARE_PROVIDER_SITE_OTHER): Payer: Medicare HMO | Admitting: Family Medicine

## 2023-01-16 ENCOUNTER — Encounter: Payer: Self-pay | Admitting: Family Medicine

## 2023-01-16 VITALS — BP 123/78 | HR 91 | Temp 97.8°F | Ht 74.0 in | Wt 269.0 lb

## 2023-01-16 DIAGNOSIS — G8929 Other chronic pain: Secondary | ICD-10-CM

## 2023-01-16 DIAGNOSIS — I10 Essential (primary) hypertension: Secondary | ICD-10-CM | POA: Diagnosis not present

## 2023-01-16 DIAGNOSIS — M549 Dorsalgia, unspecified: Secondary | ICD-10-CM

## 2023-01-16 DIAGNOSIS — E1165 Type 2 diabetes mellitus with hyperglycemia: Secondary | ICD-10-CM

## 2023-01-16 DIAGNOSIS — E538 Deficiency of other specified B group vitamins: Secondary | ICD-10-CM

## 2023-01-16 MED ORDER — AMOXICILLIN 875 MG PO TABS: 875.0000 mg | ORAL_TABLET | Freq: Two times a day (BID) | ORAL | 0 refills | Status: DC

## 2023-01-16 NOTE — Assessment & Plan Note (Signed)
Blood pressure at goal today on diltiazem 120 mg daily, metoprolol succinate 100 mg daily, losartan 50 mg daily.

## 2023-01-16 NOTE — Patient Instructions (Signed)
It was very nice to see you today!  I am glad that you are doing well.   I we will send a prescription in for amoxicillin.  We will see you back in November for your annual physical.  Come back sooner as needed.  Take care, Dr [name]  PLEASE NOTE:  If you had any lab tests, please let us know if you have not heard back within a few days. You may see your results on mychart before we have a chance to review them but we will give you a call once they are reviewed by [place].   If we ordered any referrals today, please let us know if you have not heard from their office within the next week.   If you had any urgent prescriptions sent in today, please check with the pharmacy within an hour of our visit to make sure the prescription was transmitted appropriately.   Please try these tips to maintain a healthy lifestyle:  Eat at least 3 REAL meals and 1-2 snacks per day.  Aim for no more than 5 hours between eating.  If you eat breakfast, please do so within one hour of getting up.   Each meal should contain half fruits/vegetables, one quarter protein, and one quarter carbs (no bigger than a computer mouse)  Cut down on sweet beverages. This includes juice, soda, and sweet tea.   Drink at least 1 glass of water with each meal and aim for at least 8 glasses per day  Exercise at least 150 minutes every week.

## 2023-01-16 NOTE — Assessment & Plan Note (Signed)
Follows with endocrinology.  Last A1c very well-controlled.  On Ozempic 0.5 mg weekly.

## 2023-01-16 NOTE — Assessment & Plan Note (Signed)
He is doing much better on B12 replacement.  He has now been transition to 1000 mcg daily.  We can recheck again at CPE later this year.

## 2023-01-16 NOTE — Assessment & Plan Note (Signed)
He is down 15 pounds since our last visit. He is currently on ozempic 0.'5mg'$  weekly.  Tolerating well.

## 2023-01-16 NOTE — Progress Notes (Signed)
   Gordon Phillips is a 77 y.o. male who presents today for an office visit.  Assessment/Plan:  New/Acute Problems: Dental Infection Will give prescription for amoxicillin.  He has done well with this in the past.  Advised him to follow-up with his dentist soon.  Chronic Problems Addressed Today: B12 deficiency He is doing much better on B12 replacement.  He has now been transition to 1000 mcg daily.  We can recheck again at CPE later this year.  Essential hypertension Blood pressure at goal today on diltiazem 120 mg daily, metoprolol succinate 100 mg daily, losartan 50 mg daily.   Morbid obesity ([place]) He is down 15 pounds since our last visit. He is currently on ozempic 0.5mg  weekly.  Tolerating well.  Type 2 diabetes mellitus ([place]) Follows with endocrinology.  Last A1c very well-controlled.  On Ozempic 0.5 mg weekly.     Subjective:  HPI:  See A/p for status of chronic conditions.  He is here today for follow up. Overall he is doing well. He has been following with endocrinology he is down about 15 pounds since her last visit.  Doing well on Ozempic.  He has had issues with recurrent dental infection on the left upper maxilla.  Recently started again several days ago.  Some drainage.  Some pain.  No fevers or chills.        Objective:  Physical Exam: BP 123/78   Pulse 91   Temp 97.8 F (36.6 C) (Temporal)   Ht 6\' 2"  (1.88 m)   Wt 269 lb (122 kg)   SpO2 96%   BMI 34.54 kg/m   Wt Readings from Last 3 Encounters:  01/16/23 269 lb (122 kg)  01/09/23 269 lb 12.8 oz (122.4 kg)  01/02/23 271 lb 9.6 oz (123.2 kg)    Gen: No acute distress, resting comfortably HEENT: Erythema and gingival irritation on left upper maxillary gingiva CV: Regular rate and rhythm with no murmurs appreciated Pulm: Normal work of breathing, clear to auscultation bilaterally with no crackles, wheezes, or rhonchi Neuro: Grossly normal, moves all extremities Psych: Normal affect and thought  content      Kaydee Magel M. [name], MD 01/16/2023 2:42 PM

## 2023-01-17 ENCOUNTER — Other Ambulatory Visit (HOSPITAL_BASED_OUTPATIENT_CLINIC_OR_DEPARTMENT_OTHER): Payer: Medicare HMO

## 2023-01-22 NOTE — Pre-Procedure Instructions (Signed)
Instructed patient on the following items: Arrival time 1100 Nothing to eat or drink after midnight No meds AM of procedure Responsible person to drive you home and stay with you for 24 hrs  Have you missed any doses of anti-coagulant Eliquis- hasn't missed any doses, take both doses today, don't take any in the morning.

## 2023-01-23 ENCOUNTER — Ambulatory Visit (HOSPITAL_COMMUNITY): Payer: Medicare HMO | Admitting: Anesthesiology

## 2023-01-23 ENCOUNTER — Ambulatory Visit (HOSPITAL_COMMUNITY)
Admission: RE | Admit: 2023-01-23 | Discharge: 2023-01-23 | Disposition: A | Discharge status: Home / Self Care | Payer: Medicare HMO | Attending: Cardiology | Admitting: Cardiology

## 2023-01-23 ENCOUNTER — Ambulatory Visit (HOSPITAL_COMMUNITY)
Admission: RE | Disposition: A | Discharge status: Home / Self Care | Payer: Medicare HMO | Source: Home / Self Care | Attending: Cardiology

## 2023-01-23 ENCOUNTER — Other Ambulatory Visit: Payer: Self-pay

## 2023-01-23 ENCOUNTER — Ambulatory Visit (HOSPITAL_BASED_OUTPATIENT_CLINIC_OR_DEPARTMENT_OTHER): Payer: Medicare HMO | Admitting: Anesthesiology

## 2023-01-23 DIAGNOSIS — E119 Type 2 diabetes mellitus without complications: Secondary | ICD-10-CM | POA: Diagnosis not present

## 2023-01-23 DIAGNOSIS — Z6834 Body mass index (BMI) 34.0-34.9, adult: Secondary | ICD-10-CM | POA: Insufficient documentation

## 2023-01-23 DIAGNOSIS — I484 Atypical atrial flutter: Secondary | ICD-10-CM | POA: Insufficient documentation

## 2023-01-23 DIAGNOSIS — G4733 Obstructive sleep apnea (adult) (pediatric): Secondary | ICD-10-CM | POA: Diagnosis not present

## 2023-01-23 DIAGNOSIS — E785 Hyperlipidemia, unspecified: Secondary | ICD-10-CM | POA: Diagnosis not present

## 2023-01-23 DIAGNOSIS — I10 Essential (primary) hypertension: Secondary | ICD-10-CM | POA: Diagnosis not present

## 2023-01-23 DIAGNOSIS — Z87891 Personal history of nicotine dependence: Secondary | ICD-10-CM

## 2023-01-23 DIAGNOSIS — E669 Obesity, unspecified: Secondary | ICD-10-CM | POA: Diagnosis not present

## 2023-01-23 DIAGNOSIS — I4819 Other persistent atrial fibrillation: Secondary | ICD-10-CM

## 2023-01-23 DIAGNOSIS — I4891 Unspecified atrial fibrillation: Secondary | ICD-10-CM | POA: Diagnosis not present

## 2023-01-23 HISTORY — PX: ATRIAL FIBRILLATION ABLATION: EP1191

## 2023-01-23 LAB — POCT ACTIVATED CLOTTING TIME: Activated Clotting Time: 325 seconds

## 2023-01-23 LAB — GLUCOSE, CAPILLARY
Glucose-Capillary: 79 mg/dL (ref 70–99)
Glucose-Capillary: 72 mg/dL (ref 70–99)

## 2023-01-23 SURGERY — ATRIAL FIBRILLATION ABLATION
Anesthesia: General

## 2023-01-23 MED ORDER — ACETAMINOPHEN 325 MG PO TABS: 650.0000 mg | ORAL_TABLET | ORAL | Status: DC | PRN

## 2023-01-23 MED ORDER — ONDANSETRON HCL 4 MG/2ML IJ SOLN: 4.0000 mg | Freq: Four times a day (QID) | INTRAMUSCULAR | Status: DC | PRN

## 2023-01-23 MED ORDER — SODIUM CHLORIDE 0.9 % IV SOLN: INTRAVENOUS | Status: DC

## 2023-01-23 MED ORDER — HEPARIN SODIUM (PORCINE) 1000 UNIT/ML IJ SOLN
INTRAMUSCULAR | Status: DC | PRN
  Administered 2023-01-23: 1000 [IU] via INTRAVENOUS

## 2023-01-23 MED ORDER — HEPARIN SODIUM (PORCINE) 1000 UNIT/ML IJ SOLN
INTRAMUSCULAR | Status: AC
  Filled 2023-01-23: qty 10

## 2023-01-23 MED ORDER — FENTANYL CITRATE (PF) 250 MCG/5ML IJ SOLN
INTRAMUSCULAR | Status: DC | PRN
  Administered 2023-01-23: 25 ug via INTRAVENOUS
  Administered 2023-01-23: 50 ug via INTRAVENOUS

## 2023-01-23 MED ORDER — PHENYLEPHRINE HCL-NACL 20-0.9 MG/250ML-% IV SOLN
INTRAVENOUS | Status: DC | PRN
  Administered 2023-01-23: 20 ug/min via INTRAVENOUS

## 2023-01-23 MED ORDER — ROCURONIUM BROMIDE 10 MG/ML (PF) SYRINGE
PREFILLED_SYRINGE | INTRAVENOUS | Status: DC | PRN
  Administered 2023-01-23: 50 mg via INTRAVENOUS

## 2023-01-23 MED ORDER — PROPOFOL 10 MG/ML IV BOLUS
INTRAVENOUS | Status: DC | PRN
  Administered 2023-01-23: 150 mg via INTRAVENOUS

## 2023-01-23 MED ORDER — ONDANSETRON HCL 4 MG/2ML IJ SOLN
INTRAMUSCULAR | Status: DC | PRN
  Administered 2023-01-23: 4 mg via INTRAVENOUS

## 2023-01-23 MED ORDER — SODIUM CHLORIDE 0.9% FLUSH: 3.0000 mL | Freq: Two times a day (BID) | INTRAVENOUS | Status: DC

## 2023-01-23 MED ORDER — SODIUM CHLORIDE 0.9 % IV SOLN: INTRAVENOUS | Status: DC | PRN

## 2023-01-23 MED ORDER — SODIUM CHLORIDE 0.9% FLUSH: 3.0000 mL | INTRAVENOUS | Status: DC | PRN

## 2023-01-23 MED ORDER — SUGAMMADEX SODIUM 200 MG/2ML IV SOLN
INTRAVENOUS | Status: DC | PRN
  Administered 2023-01-23: 300 mg via INTRAVENOUS

## 2023-01-23 MED ORDER — HEPARIN SODIUM (PORCINE) 1000 UNIT/ML IJ SOLN
INTRAMUSCULAR | Status: DC | PRN
  Administered 2023-01-23: 2000 [IU] via INTRAVENOUS
  Administered 2023-01-23: 15000 [IU] via INTRAVENOUS

## 2023-01-23 MED ORDER — HEPARIN (PORCINE) IN NACL 1000-0.9 UT/500ML-% IV SOLN
INTRAVENOUS | Status: DC | PRN
  Administered 2023-01-23 (×4): 500 mL

## 2023-01-23 MED ORDER — PROTAMINE SULFATE 10 MG/ML IV SOLN
INTRAVENOUS | Status: DC | PRN
  Administered 2023-01-23: 40 mg via INTRAVENOUS

## 2023-01-23 MED ORDER — FENTANYL CITRATE (PF) 100 MCG/2ML IJ SOLN
INTRAMUSCULAR | Status: AC
  Filled 2023-01-23: qty 2

## 2023-01-23 MED ORDER — DOBUTAMINE INFUSION FOR EP/ECHO/NUC (1000 MCG/ML)
INTRAVENOUS | Status: AC
  Filled 2023-01-23: qty 250

## 2023-01-23 MED ORDER — SODIUM CHLORIDE 0.9 % IV SOLN: 250.0000 mL | INTRAVENOUS | Status: DC | PRN

## 2023-01-23 MED ORDER — SUCCINYLCHOLINE CHLORIDE 200 MG/10ML IV SOSY
PREFILLED_SYRINGE | INTRAVENOUS | Status: DC | PRN
  Administered 2023-01-23: 160 mg via INTRAVENOUS

## 2023-01-23 SURGICAL SUPPLY — 19 items
BAG SNAP BAND KOVER 36X36 (MISCELLANEOUS) IMPLANT
CATH ABLAT QDOT MICRO BI TC DF (CATHETERS) IMPLANT
CATH OCTARAY 2.0 F 3-3-3-3-3 (CATHETERS) IMPLANT
CATH PIGTAIL STEERABLE D1 8.7 (WIRE) IMPLANT
CATH S-M CIRCA TEMP PROBE (CATHETERS) IMPLANT
CATH SOUNDSTAR ECO 8FR (CATHETERS) IMPLANT
CATH WEBSTER BI DIR CS D-F CRV (CATHETERS) IMPLANT
CLOSURE PERCLOSE PROSTYLE (VASCULAR PRODUCTS) IMPLANT
COVER SWIFTLINK CONNECTOR (BAG) ×1 IMPLANT
MAT PREVALON FULL STRYKER (MISCELLANEOUS) IMPLANT
PACK EP LATEX FREE (CUSTOM PROCEDURE TRAY) ×1
PACK EP LF (CUSTOM PROCEDURE TRAY) ×1 IMPLANT
PAD DEFIB RADIO PHYSIO CONN (PAD) ×1 IMPLANT
PATCH CARTO3 (PAD) IMPLANT
SHEATH CARTO VIZIGO SM CVD (SHEATH) IMPLANT
SHEATH PINNACLE 7F 10CM (SHEATH) IMPLANT
SHEATH PINNACLE 8F 10CM (SHEATH) IMPLANT
SHEATH PINNACLE 9F 10CM (SHEATH) IMPLANT
TUBING SMART ABLATE COOLFLOW (TUBING) IMPLANT

## 2023-01-23 NOTE — Anesthesia Procedure Notes (Addendum)
Procedure Name: Intubation Date/Time: 01/23/2023 12:45 PM  Performed by: Thelma Comp, CRNAPre-anesthesia Checklist: Patient identified, Emergency Drugs available, Suction available and Patient being monitored Patient Re-evaluated:Patient Re-evaluated prior to induction Oxygen Delivery Method: Circle System Utilized Preoxygenation: Pre-oxygenation with 100% oxygen Induction Type: IV induction Ventilation: Two handed mask ventilation required Laryngoscope Size: Mac and 4 Grade View: Grade I Tube type: Oral Tube size: 7.0 mm Number of attempts: 1 Airway Equipment and Method: Stylet Placement Confirmation: ETT inserted through vocal cords under direct vision, positive ETCO2 and breath sounds checked- equal and bilateral Secured at: 23 cm Tube secured with: Tape Dental Injury: Teeth and Oropharynx as per pre-operative assessment

## 2023-01-23 NOTE — Anesthesia Preprocedure Evaluation (Signed)
Anesthesia Evaluation  Patient identified by MRN, date of birth, ID band Patient awake    Reviewed: Allergy & Precautions, H&P , NPO status , Patient's Chart, lab work & pertinent test results  Airway Mallampati: III  TM Distance: <3 FB Neck ROM: Full    Dental no notable dental hx.    Pulmonary sleep apnea and Continuous Positive Airway Pressure Ventilation , former smoker   Pulmonary exam normal breath sounds clear to auscultation       Cardiovascular hypertension, Pt. on medications and Pt. on home beta blockers  Rhythm:Regular Rate:Normal + Systolic murmurs 1. Left ventricular ejection fraction, by estimation, is 50 to 55%. The  left ventricle has low normal function. The left ventricle has no regional  wall motion abnormalities. There is moderate asymmetric left ventricular  hypertrophy of the basal-septal  segment. Left ventricular diastolic parameters are indeterminate.   2. Right ventricular systolic function is normal. The right ventricular  size is normal.   3. The mitral valve is grossly normal. Trivial mitral valve  regurgitation. No evidence of mitral stenosis.   4. The aortic valve has an indeterminant number of cusps- apperance of  forme fruste aortic valve . There is moderate calcification of the aortic  valve. Aortic valve regurgitation is not visualized. Aortic valve  sclerosis/calcification is present,  without any evidence of aortic stenosis.   5. The inferior vena cava is normal in size with greater than 50%  respiratory variability, suggesting right atrial pressure of 3 mmHg.   Comparison(s): LV function slightly improved from 2022 TEE (different  modality assessed).     Neuro/Psych CVA  negative psych ROS   GI/Hepatic negative GI ROS, Neg liver ROS,,,  Endo/Other  diabetes, Type 2    Renal/GU negative Renal ROS  negative genitourinary   Musculoskeletal negative musculoskeletal ROS (+)     Abdominal   Peds negative pediatric ROS (+)  Hematology negative hematology ROS (+)   Anesthesia Other Findings   Reproductive/Obstetrics negative OB ROS                             Anesthesia Physical Anesthesia Plan  ASA: 3  Anesthesia Plan: General   Post-op Pain Management: Minimal or no pain anticipated   Induction: Intravenous  PONV Risk Score and Plan: 2 and Ondansetron, Dexamethasone and Treatment may vary due to age or medical condition  Airway Management Planned: Oral ETT and Video Laryngoscope Planned  Additional Equipment:   Intra-op Plan:   Post-operative Plan: Extubation in OR  Informed Consent: I have reviewed the patients History and Physical, chart, labs and discussed the procedure including the risks, benefits and alternatives for the proposed anesthesia with the patient or authorized representative who has indicated his/her understanding and acceptance.     Dental advisory given  Plan Discussed with: CRNA and Surgeon  Anesthesia Plan Comments: (Grade 3 view with Sabra Heck 3 previously, will use glidescope )       Anesthesia Quick Evaluation

## 2023-01-23 NOTE — Transfer of Care (Signed)
Immediate Anesthesia Transfer of Care Note  Patient: Treasure Lisonbee  Procedure(s) Performed: ATRIAL FIBRILLATION ABLATION  Patient Location: PACU and Cath Lab  Anesthesia Type:General  Level of Consciousness: drowsy and patient cooperative  Airway & Oxygen Therapy: Patient Spontanous Breathing and Patient connected to nasal cannula oxygen  Post-op Assessment: Report given to RN and Post -op Vital signs reviewed and stable  Post vital signs: Reviewed and stable  Last Vitals:  Vitals Value Taken Time  BP 147/92 01/23/23 1417  Temp    Pulse 86 01/23/23 1421  Resp 18 01/23/23 1421  SpO2 90 % 01/23/23 1421  Vitals shown include unvalidated device data.  Last Pain: There were no vitals filed for this visit.       Complications: There were no known notable events for this encounter.

## 2023-01-23 NOTE — Discharge Instructions (Signed)

## 2023-01-23 NOTE — Anesthesia Postprocedure Evaluation (Signed)
Anesthesia Post Note  Patient: Gordon Phillips  Procedure(s) Performed: ATRIAL FIBRILLATION ABLATION     Patient location during evaluation: PACU Anesthesia Type: General Level of consciousness: awake and alert Pain management: pain level controlled Vital Signs Assessment: post-procedure vital signs reviewed and stable Respiratory status: spontaneous breathing, nonlabored ventilation, respiratory function stable and patient connected to nasal cannula oxygen Cardiovascular status: blood pressure returned to baseline and stable Postop Assessment: no apparent nausea or vomiting Anesthetic complications: no  There were no known notable events for this encounter.  Last Vitals:  Vitals:   01/23/23 1422 01/23/23 1430  BP:  (!) 153/93  Pulse:  78  Resp:  19  Temp: 36.5 C   SpO2:  99%    Last Pain:  Vitals:   01/23/23 1422  TempSrc: Temporal  PainSc: 8                  Gregori Abril S

## 2023-01-23 NOTE — Interval H&P Note (Signed)
History and Physical Interval Note:  01/23/2023 11:37 AM  Ursula Alert  has presented today for surgery, with the diagnosis of afib.  The various methods of treatment have been discussed with the patient and family. After consideration of risks, benefits and other options for treatment, the patient has consented to  Procedure(s): ATRIAL FIBRILLATION ABLATION (N/A) as a surgical intervention.  The patient's history has been reviewed, patient examined, no change in status, stable for surgery.  I have reviewed the patient's chart and labs.  Questions were answered to the patient's satisfaction.     Bastion Bolger [employer]

## 2023-01-24 ENCOUNTER — Encounter (HOSPITAL_COMMUNITY): Payer: Self-pay | Admitting: Cardiology

## 2023-01-24 MED FILL — Dobutamine in Dextrose 5% Inj 4 MG/ML: INTRAVENOUS | Qty: 250 | Status: AC

## 2023-01-24 MED FILL — Fentanyl Citrate Preservative Free (PF) Inj 100 MCG/2ML: INTRAMUSCULAR | Qty: 1.5 | Status: AC

## 2023-02-06 ENCOUNTER — Ambulatory Visit: Payer: Medicare HMO

## 2023-02-20 ENCOUNTER — Encounter (HOSPITAL_COMMUNITY): Payer: Self-pay | Admitting: Physician Assistant

## 2023-02-20 ENCOUNTER — Ambulatory Visit (HOSPITAL_COMMUNITY)
Admission: RE | Admit: 2023-02-20 | Discharge: 2023-02-20 | Disposition: A | Discharge status: Home / Self Care | Payer: Medicare HMO | Source: Ambulatory Visit | Attending: Physician Assistant | Admitting: Physician Assistant

## 2023-02-20 VITALS — BP 142/92 | HR 83 | Ht 74.0 in | Wt 266.8 lb

## 2023-02-20 DIAGNOSIS — E669 Obesity, unspecified: Secondary | ICD-10-CM | POA: Diagnosis not present

## 2023-02-20 DIAGNOSIS — I10 Essential (primary) hypertension: Secondary | ICD-10-CM | POA: Diagnosis not present

## 2023-02-20 DIAGNOSIS — D6869 Other thrombophilia: Secondary | ICD-10-CM | POA: Insufficient documentation

## 2023-02-20 DIAGNOSIS — Z7901 Long term (current) use of anticoagulants: Secondary | ICD-10-CM | POA: Diagnosis not present

## 2023-02-20 DIAGNOSIS — I484 Atypical atrial flutter: Secondary | ICD-10-CM

## 2023-02-20 DIAGNOSIS — G4733 Obstructive sleep apnea (adult) (pediatric): Secondary | ICD-10-CM | POA: Insufficient documentation

## 2023-02-20 DIAGNOSIS — Z6834 Body mass index (BMI) 34.0-34.9, adult: Secondary | ICD-10-CM | POA: Diagnosis not present

## 2023-02-20 DIAGNOSIS — I4892 Unspecified atrial flutter: Secondary | ICD-10-CM | POA: Insufficient documentation

## 2023-02-20 DIAGNOSIS — I4819 Other persistent atrial fibrillation: Secondary | ICD-10-CM | POA: Insufficient documentation

## 2023-02-20 NOTE — Progress Notes (Signed)
Primary Care Physician: Ardith Dark, MD Primary Cardiologist: Dr Cristal Deer  Primary Electrophysiologist: Dr Elberta Fortis Referring Physician: [facility]   Gordon Phillips is a 77 y.o. male with a history of HTN, OSA w/CPAP, MUGUS, CVA (by imaging), remote pericarditis, CBP, vitiligo and migraines, atrial flutter s/p ablation, and atrial fibrillation who presents for follow up in the [facility]. The patient was initially diagnosed with atrial fibrillation and atrial flutter in  2019. He underwent flutter ablation with Dr Graciela Husbands on 04/23/19. Patient is on Eliquis for a CHADS2VASC score of 5. He was in his usual state of health until 12/20/20 when he had tachypalpitations while walking. The next day, his palpitations worsened and he also developed chest pain and presented to the ED. ECG showed rapid afib. He was not cardioverted at that time because it was unclear if he was paroxysmal or persistent. He denies any significant alcohol use. Patient is s/p DCCV on 01/04/21. He reports that initially he felt "100%" but did have fatigue with exertion a few days later and was found to be in atrial flutter.  Patient is s/p afib ablation with Dr Elberta Fortis on 03/30/21. He was found to be back in afib at his visit on 05/29/22. Seen by Otilio Saber on 07/17/22 and a monitor was placed which showed 100% afib burden. He is now s/p repeat ablation on 01/23/23.  On follow up today, patient remains in SR. His fatigue has improved. He denies chest pain, swallowing pain, or groin issues. No bleeding issues on anticoagulation.   Today, he denies symptoms of palpitations, chest pain, shortness of breath, orthopnea, PND, lower extremity edema, dizziness, presyncope, syncope, bleeding, or neurologic sequela. The patient is tolerating medications without difficulties and is otherwise without complaint today.    Atrial Fibrillation Risk Factors:  he does have symptoms or diagnosis of sleep apnea. he is  compliant with CPAP therapy. he does not have a history of rheumatic fever. he does not have a history of alcohol use. The patient does not have a history of early familial atrial fibrillation or other arrhythmias.  he has a BMI of Body mass index is 34.26 kg/m.Marland Kitchen Filed Weights   02/20/23 1134  Weight: 121 kg    Family History  Problem Relation Age of Onset   Stroke Father        Passed away in his 49s   Thyroid disease Sister        goiter   Sudden death Brother        ? Drug use, no autopsy   Throat cancer Brother    Colon cancer Neg Hx    Esophageal cancer Neg Hx    Rectal cancer Neg Hx    Stomach cancer Neg Hx      Atrial Fibrillation Management history:  Previous antiarrhythmic drugs: none Previous cardioversions: 2019, 01/04/21 Previous ablations: 04/23/19, 03/30/21, 01/23/23 CHADS2VASC score: 5 Anticoagulation history: Eliquis   Past Medical History:  Diagnosis Date   ALLERGIC RHINITIS 01/25/2010   Arthritis    "neck, back, shoulders," (02/20/2017)   Chronic lower back pain    COVID 06/17/2021   has lingering cough that subsding   High cholesterol    Hx of cardiovascular stress test    a. Lexiscan Myoview 02/06/13:  EF 59%, no ischemia or scar   Hx of echocardiogram    a.  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   Vitiligo    Past Surgical History:  Procedure Laterality Date   A-FLUTTER ABLATION N/A 04/23/2019   Procedure: A-FLUTTER ABLATION;  Surgeon: Duke Salvia, MD;  Location: [facility] INVASIVE CV LAB;  Service: Cardiovascular;  Laterality: N/A;   ATRIAL  FIBRILLATION ABLATION N/A 03/30/2021   Procedure: ATRIAL FIBRILLATION ABLATION;  Surgeon: Regan Lemming, MD;  Location: [facility] INVASIVE CV LAB;  Service: Cardiovascular;  Laterality: N/A;   ATRIAL FIBRILLATION ABLATION N/A 01/23/2023   Procedure: ATRIAL FIBRILLATION ABLATION;  Surgeon: Regan Lemming, MD;  Location: [facility] INVASIVE CV LAB;  Service: Cardiovascular;  Laterality: N/A;   BIOPSY  09/29/2019   Procedure: BIOPSY;  Surgeon: Tressia Danas, MD;  Location: [facility] ENDOSCOPY;  Service: Gastroenterology;;   CARDIOVERSION N/A 08/14/2018   Procedure: CARDIOVERSION;  Surgeon: Jake Bathe, MD;  Location: [facility] ENDOSCOPY;  Service: Cardiovascular;  Laterality: N/A;   CARDIOVERSION N/A 01/04/2021   Procedure: CARDIOVERSION;  Surgeon: Chrystie Nose, MD;  Location: [facility] ENDOSCOPY;  Service: Cardiovascular;  Laterality: N/A;   CATARACT EXTRACTION Left 09/2022   ESOPHAGOGASTRODUODENOSCOPY N/A 09/29/2019   Procedure: ESOPHAGOGASTRODUODENOSCOPY (EGD);  Surgeon: Tressia Danas, MD;  Location: [facility] ENDOSCOPY;  Service: Gastroenterology;  Laterality: N/A;   INGUINAL HERNIA REPAIR Bilateral    LUMBAR LAMINECTOMY     TEE WITHOUT CARDIOVERSION N/A 08/14/2018   Procedure: TRANSESOPHAGEAL ECHOCARDIOGRAM (TEE);  Surgeon: Jake Bathe, MD;  Location: [facility] ENDOSCOPY;  Service: Cardiovascular;  Laterality: N/A;   TEE WITHOUT CARDIOVERSION N/A 03/30/2021   Procedure: TRANSESOPHAGEAL ECHOCARDIOGRAM (TEE);  Surgeon: Chilton Si, MD;  Location: [facility] ENDOSCOPY;  Service: Cardiovascular;  Laterality: N/A;   UMBILICAL HERNIA REPAIR      Current Outpatient Medications  Medication Sig Dispense Refill   acetaminophen (TYLENOL) 500 MG tablet Take 1,000 mg by mouth every 8 (eight) hours as needed for mild pain or moderate pain.     Aloe-Sodium Chloride (AYR SALINE NASAL GEL NA) Place 1 application into the nose at bedtime as needed (congestion).     amoxicillin (AMOXIL) 875 MG tablet Take 1 tablet (875 mg total)  by mouth 2 (two) times daily. 14 tablet 0   Ascorbic Acid (VITAMIN C PO) Take 900 mg by mouth 3 (three) times a week.     Blood Glucose Monitoring Suppl (ONETOUCH VERIO) w/Device KIT Use as instructed to check blood sugar daily. 1 kit 0   Cholecalciferol (VITAMIN D3 PO) Take 1 tablet by mouth daily.     cyanocobalamin (VITAMIN B12) 1000 MCG tablet Take 1,000 mcg by mouth daily.     diltiazem (CARDIZEM CD) 120 MG 24 hr capsule TAKE 1 CAPSULE BY MOUTH EVERY DAY 90 capsule 3   doxazosin (CARDURA) 2 MG tablet TAKE 1 TABLET BY MOUTH EVERYDAY AT BEDTIME 90 tablet 2   ELIQUIS 5 MG TABS tablet TAKE 1 TABLET BY MOUTH TWICE A DAY 180 tablet 1   finasteride (PROSCAR) 5 MG tablet Take 5 mg by mouth daily.     glucose blood (ONETOUCH VERIO) test strip Use as instructed 100 strip 3   Lancets (ONETOUCH ULTRASOFT) lancets Use as instructed to check  blood sugar once daily 100 each 3   losartan (COZAAR) 50 MG tablet TAKE 1 TABLET BY MOUTH EVERY DAY 90 tablet 1   metoprolol succinate (TOPROL-XL) 100 MG 24 hr tablet TAKE 1 TABLET (100 MG TOTAL) BY MOUTH AT BEDTIME. TAKE WITH OR IMMEDIATELY FOLLOWING A MEAL. 90 tablet 3   OVER THE COUNTER MEDICATION Take 1 capsule by mouth at bedtime. OTC - Super Beta Prostate     pravastatin (PRAVACHOL) 40 MG tablet TAKE 1 TABLET BY MOUTH EVERYDAY AT BEDTIME 90 tablet 3   PRESCRIPTION MEDICATION Inhale into the lungs at bedtime. CPAP     Semaglutide,0.25 or 0.5MG /DOS, (OZEMPIC, 0.25 OR 0.5 MG/DOSE,) 2 MG/3ML SOPN Inject under skin 0.5 mg weekly 9 mL 3   sildenafil (VIAGRA) 100 MG tablet Take 100 mg by mouth daily as needed for erectile dysfunction.      No current facility-administered medications for this encounter.    Allergies  Allergen Reactions   Flomax [Tamsulosin Hcl] Itching   Relpax [Eletriptan] Other (See Comments) and Hypertension    Chest pain and sweating   Other Palpitations    Any steroids cause heart palpitations  (tolerates low doses)    Social History    Socioeconomic History   Marital status: Married    Spouse name: Not on file   Number of children: 2   Years of education: Not on file   Highest education level: Not on file  Occupational History   Occupation: Airline pilot   Occupation: retired  Tobacco Use   Smoking status: Former    Packs/day: 0.50    Years: 8.00    Additional pack years: 0.00    Total pack years: 4.00    Types: Cigarettes    Quit date: 1968    Years since quitting: 56.3   Smokeless tobacco: Never   Tobacco comments:    Former smoker 08/22/22  Vaping Use   Vaping Use: Never used  Substance and Sexual Activity   Alcohol use: Yes    Alcohol/week: 1.0 - 2.0 standard drink of alcohol    Types: 1 - 2 Shots of liquor per week    Comment: 02/20/2017 "couple drinks/month on average"   Drug use: No   Sexual activity: Yes  Other Topics Concern   Not on file  Social History Narrative   Not on file   Social Determinants of Health   Financial Resource Strain: Low Risk  (08/21/2022)   Overall Financial Resource Strain (CARDIA)    Difficulty of Paying Living Expenses: Not hard at all  Food Insecurity: No Food Insecurity (08/21/2022)   Hunger Vital Sign    Worried About Running Out of Food in the Last Year: Never true    Ran Out of Food in the Last Year: Never true  Transportation Needs: No Transportation Needs (08/21/2022)   PRAPARE - Administrator, Civil Service (Medical): No    Lack of Transportation (Non-Medical): No  Physical Activity: Inactive (08/21/2022)   Exercise Vital Sign    Days of Exercise per Week: 0 days    Minutes of Exercise per Session: 0 min  Stress: No Stress Concern Present (08/21/2022)   [employer] of Occupational Health - Occupational Stress Questionnaire    Feeling of Stress : Not at all  Social Connections: Moderately Isolated (08/21/2022)   Social Connection and Isolation Panel [NHANES]    Frequency of Communication with Friends and Family: More than three times a  week    Frequency of Social Gatherings  with Friends and Family: More than three times a week    Attends Religious Services: Never    Active Member of Clubs or Organizations: No    Attends Banker Meetings: Never    Marital Status: Married  Catering manager Violence: Not At Risk (08/21/2022)   Humiliation, Afraid, Rape, and Kick questionnaire    Fear of Current or Ex-Partner: No    Emotionally Abused: No    Physically Abused: No    Sexually Abused: No     ROS- All systems are reviewed and negative except as per the HPI above.  Physical Exam: Vitals:   02/20/23 1134  BP: (!) 142/92  Pulse: 83  Weight: 121 kg  Height:  (1.88 m)    GEN- The patient is a well appearing elderly male, alert and oriented x 3 today.   HEENT-head normocephalic, atraumatic, sclera clear, conjunctiva pink, hearing intact, trachea midline. Lungs- Clear to ausculation bilaterally, normal work of breathing Heart- Regular rate and rhythm, no murmurs, rubs or gallops  GI- soft, NT, ND, + BS Extremities- no clubbing, cyanosis, or edema MS- no significant deformity or atrophy Skin- no rash or lesion Psych- euthymic mood, full affect Neuro- strength and sensation are intact   Wt Readings from Last 3 Encounters:  02/20/23 121 kg  01/23/23 120.2 kg  01/16/23 122 kg    EKG today demonstrates  SR, 1st degree AV block Vent. rate 83 BPM PR interval 298 ms QRS duration 84 ms QT/QTcB 362/425 ms  Echo 02/07/21 demonstrated   1. Left ventricular ejection fraction, by estimation, is 50 to 55%. The  left ventricle has low normal function. The left ventricle has no regional wall motion abnormalities. There is moderate asymmetric left ventricular hypertrophy of the basal-septal segment. Left ventricular diastolic parameters are indeterminate.   2. Right ventricular systolic function is normal. The right ventricular  size is normal.   3. The mitral valve is grossly normal. Trivial mitral valve   regurgitation. No evidence of mitral stenosis.   4. The aortic valve has an indeterminant number of cusps- apperance of forme fruste aortic valve . There is moderate calcification of the aortic valve. Aortic valve regurgitation is not visualized. Aortic valve sclerosis/calcification is present,  without any evidence of aortic stenosis.   5. The inferior vena cava is normal in size with greater than 50%  respiratory variability, suggesting right atrial pressure of 3 mmHg.   Comparison(s): LV function slightly improved from 2022 TEE (different modality assessed).    Epic records are reviewed at length today  CHA2DS2-VASc Score = 5  The patient's score is based upon: CHF History: 0 HTN History: 1 Diabetes History: 0 Stroke History: 2 Vascular Disease History: 0 Age Score: 2 Gender Score: 0        ASSESSMENT AND PLAN: 1. Persistent Atrial Fibrillation/atrial flutter The patient's CHA2DS2-VASc score is 5, indicating a 7.2% annual risk of stroke. S/p afib ablation on 03/30/21 and 319/24 Patient appears to be maintaining SR.  Continue Eliquis 5 mg BID  Continue Toprol 100 mg daily Continue diltiazem 120 mg daily  2. Secondary Hypercoagulable State (ICD10:  D68.69) The patient is at significant risk for stroke/thromboembolism based upon his CHA2DS2-VASc Score of 5.  Continue Apixaban (Eliquis).   3. Obesity Body mass index is 34.26 kg/m. Lifestyle modification was discussed and encouraged including regular physical activity and weight reduction. Patient now on Ozempic   4. Obstructive sleep apnea Encouraged compliance with CPAP therapy.  5. HTN Stable,  no changes today.    Follow up with Dr Elberta Fortis as scheduled.    Jorja Loa PA-C [facility] [address] [place], [place] [address] [phone] 02/20/2023 11:57 AM

## 2023-02-26 ENCOUNTER — Inpatient Hospital Stay: Payer: Medicare HMO | Attending: Nurse Practitioner

## 2023-02-26 DIAGNOSIS — D472 Monoclonal gammopathy: Secondary | ICD-10-CM | POA: Insufficient documentation

## 2023-02-26 LAB — CMP (CANCER CENTER ONLY)
ALT: 17 U/L (ref 0–44)
AST: 22 U/L (ref 15–41)
Albumin: 4.5 g/dL (ref 3.5–5.0)
Alkaline Phosphatase: 56 U/L (ref 38–126)
Anion gap: 6 (ref 5–15)
BUN: 19 mg/dL (ref 8–23)
CO2: 29 mmol/L (ref 22–32)
Calcium: 9.9 mg/dL (ref 8.9–10.3)
Chloride: 105 mmol/L (ref 98–111)
Creatinine: 1.07 mg/dL (ref 0.61–1.24)
GFR, Estimated: >60 mL/min (ref >60)
Glucose, Bld: 87 mg/dL (ref 70–99)
Potassium: 4.1 mmol/L (ref 3.5–5.1)
Sodium: 140 mmol/L (ref 135–145)
Total Bilirubin: 0.7 mg/dL (ref 0.3–1.2)
Total Protein: 7.8 g/dL (ref 6.5–8.1)

## 2023-02-26 LAB — CBC WITH DIFFERENTIAL (CANCER CENTER ONLY)
Abs Immature Granulocytes: 0.01 10*3/uL (ref 0.00–0.07)
Basophils Absolute: 0 10*3/uL (ref 0.0–0.1)
Basophils Relative: 0 %
Eosinophils Absolute: 0.1 10*3/uL (ref 0.0–0.5)
Eosinophils Relative: 2 %
HCT: 41 % (ref 39.0–52.0)
Hemoglobin: 13.6 g/dL (ref 13.0–17.0)
Immature Granulocytes: 0 %
Lymphocytes Relative: 33 %
Lymphs Abs: 2 10*3/uL (ref 0.7–4.0)
MCH: 31.1 pg (ref 26.0–34.0)
MCHC: 33.2 g/dL (ref 30.0–36.0)
MCV: 93.6 fL (ref 80.0–100.0)
Monocytes Absolute: 0.4 10*3/uL (ref 0.1–1.0)
Monocytes Relative: 7 %
Neutro Abs: 3.5 10*3/uL (ref 1.7–7.7)
Neutrophils Relative %: 58 %
Platelet Count: 139 10*3/uL — ABNORMAL LOW (ref 150–400)
RBC: 4.38 MIL/uL (ref 4.22–5.81)
RDW: 13 % (ref 11.5–15.5)
WBC Count: 6 10*3/uL (ref 4.0–10.5)
nRBC: 0 % (ref 0.0–0.2)

## 2023-02-27 LAB — KAPPA/LAMBDA LIGHT CHAINS
Kappa free light chain: 14.5 mg/L (ref 3.3–19.4)
Kappa, lambda light chain ratio: 1.45 (ref 0.26–1.65)
Lambda free light chains: 10 mg/L (ref 5.7–26.3)

## 2023-02-28 ENCOUNTER — Other Ambulatory Visit: Payer: Medicare HMO

## 2023-03-02 LAB — MULTIPLE MYELOMA PANEL, SERUM
Albumin SerPl Elph-Mcnc: 4 g/dL (ref 2.9–4.4)
Albumin/Glob SerPl: 1.4 (ref 0.7–1.7)
Alpha 1: 0.2 g/dL (ref 0.0–0.4)
Alpha2 Glob SerPl Elph-Mcnc: 0.9 g/dL (ref 0.4–1.0)
B-Globulin SerPl Elph-Mcnc: 0.8 g/dL (ref 0.7–1.3)
Gamma Glob SerPl Elph-Mcnc: 1.2 g/dL (ref 0.4–1.8)
Globulin, Total: 3 g/dL (ref 2.2–3.9)
IgA: 118 mg/dL (ref 61–437)
IgG (Immunoglobin G), Serum: 1267 mg/dL (ref 603–1613)
IgM (Immunoglobulin M), Srm: 68 mg/dL (ref 15–143)
M Protein SerPl Elph-Mcnc: 0.3 g/dL — ABNORMAL HIGH
Total Protein ELP: 7 g/dL (ref 6.0–8.5)

## 2023-03-27 ENCOUNTER — Telehealth: Payer: Self-pay

## 2023-03-27 NOTE — Telephone Encounter (Signed)
-----   Message from Malachy Mood, MD sent at 03/27/2023 12:48 PM EDT ----- Please let pt know his lab from 02/26/2023 showed stable M-protein, normal light chain levels, no concerns, thanks   Malachy Mood

## 2023-03-27 NOTE — Telephone Encounter (Signed)
Notified pt of message below. Pt verbalized understanding.  Christl Fessenden M. CMA

## 2023-04-10 ENCOUNTER — Other Ambulatory Visit: Payer: Self-pay | Admitting: Orthopedic Surgery

## 2023-04-10 DIAGNOSIS — M545 Low back pain, unspecified: Secondary | ICD-10-CM

## 2023-04-19 ENCOUNTER — Encounter: Payer: Self-pay | Admitting: Internal Medicine

## 2023-04-27 ENCOUNTER — Ambulatory Visit: Payer: Medicare HMO | Admitting: Cardiology

## 2023-04-30 ENCOUNTER — Ambulatory Visit: Payer: Medicare HMO | Attending: Cardiology | Admitting: Cardiology

## 2023-04-30 ENCOUNTER — Encounter: Payer: Self-pay | Admitting: Cardiology

## 2023-04-30 VITALS — BP 120/84 | HR 81 | Ht 74.0 in | Wt 242.8 lb

## 2023-04-30 DIAGNOSIS — D6869 Other thrombophilia: Secondary | ICD-10-CM

## 2023-04-30 DIAGNOSIS — I4819 Other persistent atrial fibrillation: Secondary | ICD-10-CM | POA: Diagnosis not present

## 2023-04-30 DIAGNOSIS — G4733 Obstructive sleep apnea (adult) (pediatric): Secondary | ICD-10-CM

## 2023-04-30 DIAGNOSIS — I10 Essential (primary) hypertension: Secondary | ICD-10-CM | POA: Diagnosis not present

## 2023-04-30 NOTE — Patient Instructions (Signed)
Medication Instructions:  Your physician has recommended you make the following change in your medication:  STOP Diltiazem (Cardizem)  *If you need a refill on your cardiac medications before your next appointment, please call your pharmacy*   Lab Work: None ordered   Testing/Procedures: None ordered   Follow-Up: At [facility], you and your health needs are our priority.  As part of our continuing mission to provide you with exceptional heart care, we have created designated Provider Care Teams.  These Care Teams include your primary Cardiologist (physician) and Advanced Practice Providers (APPs -  Physician Assistants and Nurse Practitioners) who all work together to provide you with the care you need, when you need it.  Your next appointment:   6 month(s)  The format for your next appointment:   In Person  Provider:   You will follow up in the Atrial Fibrillation Clinic located at [facility]. Your provider will be: Clint R. Fenton, PA-C{ Or  Landry Mellow, PA   Thank you for choosing [facility]!!   Dory Horn, RN [phone]

## 2023-04-30 NOTE — Progress Notes (Signed)
  Electrophysiology Office Note:   Date:  04/30/2023  ID:  Gordon Phillips, DOB December 29, 1945, MRN [id number]  Primary Cardiologist: Jodelle Red, MD Electrophysiologist: Jahson Emanuele Jorja Loa, MD      History of Present Illness:   Gordon Phillips is a 77 y.o. male with h/o atrial fibrillation seen today for routine electrophysiology followup.  Since last being seen in our clinic the patient reports doing well.  He has noted no further episodes of atrial fibrillation.  Since his ablation, he has had much less fatigue and shortness of breath.  He has been able to do all of his daily activities without restriction.  He feels he is sleeping better and has been losing weight.  he denies chest pain, palpitations, dyspnea, PND, orthopnea, nausea, vomiting, dizziness, syncope, edema, weight gain, or early satiety.      He has a history of hypertension, obstructive sleep apnea, MGUS, atrial fibrillation/flutter.  He had atrial flutter ablation 04/23/2019.  He had atrial fibrillation ablation 03/30/2021.  He had more frequent episodes of atrial fibrillation and atrial flutter and had repeat ablation with reconnection of the right-sided pulmonary veins and a micro reentrant tachycardia anterior to the left superior pulmonary vein which were ablated.  His posterior wall and CTI remained isolated.     Review of systems complete and found to be negative unless listed in HPI.   Studies Reviewed:    EKG is ordered today. Personal review as below.  EKG Interpretation  Date/Time:  Monday April 30 2023 12:01:45 EDT Ventricular Rate:  81 PR Interval:  306 QRS Duration: 86 QT Interval:  378 QTC Calculation: 439 R Axis:   75 Text Interpretation: Sinus rhythm with 1st degree A-V block When compared with ECG of 20-Feb-2023 11:39, No significant change was found Confirmed by Gordon Phillips ([id number]) on 04/30/2023 12:15:33 PM    Risk Assessment/Calculations:    CHA2DS2-VASc Score = 5   This indicates a 7.2% annual  risk of stroke. The patient's score is based upon: CHF History: 0 HTN History: 1 Diabetes History: 0 Stroke History: 2 Vascular Disease History: 0 Age Score: 2 Gender Score: 0             Physical Exam:   VS:  BP 120/84   Pulse 81   Ht 6\' 2"  (1.88 m)   Wt 242 lb 12.8 oz (110.1 kg)   SpO2 96%   BMI 31.17 kg/m    Wt Readings from Last 3 Encounters:  04/30/23 242 lb 12.8 oz (110.1 kg)  02/20/23 266 lb 12.8 oz (121 kg)  01/23/23 265 lb (120.2 kg)     GEN: Well nourished, well developed in no acute distress NECK: No JVD; No carotid bruits CARDIAC: Regular rate and rhythm, no murmurs, rubs, gallops RESPIRATORY:  Clear to auscultation without rales, wheezing or rhonchi  ABDOMEN: Soft, non-tender, non-distended EXTREMITIES:  No edema; No deformity   ASSESSMENT AND PLAN:    1.  Persistent atrial fibrillation/flutter: Currently on Eliquis, metoprolol, diltiazem.  Post repeat ablation 01/23/2023.  He has had no further episodes of atrial fibrillation.  Quite happy with his control.  Nisreen Guise stop diltiazem today.  2.  Secondary hypercoagulable state: Currently on Eliquis for atrial fibrillation  3.  Obesity: Lifestyle modification encouraged.  On Ozempic.  4.  Obstructive sleep apnea: CPAP compliance encouraged  5.  Hypertension: Currently well-controlled  Follow up with Afib Clinic in 6 months  Signed, Gordon Phillips Jorja Loa, MD

## 2023-05-01 ENCOUNTER — Ambulatory Visit: Payer: Medicare HMO | Admitting: Student

## 2023-05-22 ENCOUNTER — Ambulatory Visit
Admission: RE | Admit: 2023-05-22 | Discharge: 2023-05-22 | Disposition: A | Discharge status: Home / Self Care | Payer: Medicare HMO | Source: Ambulatory Visit | Attending: Orthopedic Surgery | Admitting: Orthopedic Surgery

## 2023-05-22 DIAGNOSIS — M545 Low back pain, unspecified: Secondary | ICD-10-CM

## 2023-06-05 ENCOUNTER — Other Ambulatory Visit: Payer: Self-pay | Admitting: Family Medicine

## 2023-07-03 ENCOUNTER — Encounter: Payer: Self-pay | Admitting: Internal Medicine

## 2023-07-03 ENCOUNTER — Ambulatory Visit: Payer: Medicare HMO | Admitting: Internal Medicine

## 2023-07-03 VITALS — BP 128/70 | HR 82 | Ht 74.0 in | Wt 256.4 lb

## 2023-07-03 DIAGNOSIS — Z8639 Personal history of other endocrine, nutritional and metabolic disease: Secondary | ICD-10-CM

## 2023-07-03 DIAGNOSIS — Z7985 Long-term (current) use of injectable non-insulin antidiabetic drugs: Secondary | ICD-10-CM | POA: Diagnosis not present

## 2023-07-03 DIAGNOSIS — E1165 Type 2 diabetes mellitus with hyperglycemia: Secondary | ICD-10-CM

## 2023-07-03 DIAGNOSIS — E7849 Other hyperlipidemia: Secondary | ICD-10-CM

## 2023-07-03 DIAGNOSIS — R7303 Prediabetes: Secondary | ICD-10-CM

## 2023-07-03 LAB — POCT GLYCOSYLATED HEMOGLOBIN (HGB A1C): Hemoglobin A1C: 5.6 % (ref 4.0–5.6)

## 2023-07-03 MED ORDER — OZEMPIC (0.25 OR 0.5 MG/DOSE) 2 MG/3ML ~~LOC~~ SOPN: PEN_INJECTOR | SUBCUTANEOUS | 3 refills | Status: DC

## 2023-07-03 NOTE — Progress Notes (Signed)
Patient ID: Gordon Phillips, male   DOB: 08-26-1946, 77 y.o.   MRN: [id number]   HPI: Gordon Phillips is a 77 y.o.-year-old male, initially referred by his PCP, Dr. Jimmey Ralph, returning for follow-up for prediabetes, dx in ~2013 and history of Graves' disease.  His wife, [name], is also my patient.  Last visit 6 months ago.  Interim history: No increased urination, blurry vision, nausea, chest pain.  He has back pain - bulging disks - he had RFA of his back, but pain persists. He continues to have steroid inj's in back - these help. He was prev. getting them every 2 months, now prn. He lost almost 15 more pounds since last visit, previously lost 13. He cut down sweet drinks, meat. Eats more fruits, nuts, seeds, veggies - Mediterranean diet. He does describe some palpitations occasionally when he stands up to go to the restroom at night.  Unclear if sugars are low at that time as he did not check.  They resolve with drinking water.  Reviewed history: Patient has a history of impaired glucose tolerance/prediabetes, but he was found to have an HbA1c in the diabetic range at last check in 08/2019.   He tells me that he knows why his HbA1c was higher: he was working part-time, but stopped working at the start of the coronavirus pandemic and started to eat more.   After the HbA1c returned high >> he started to change his diet: reduced bread, stopped sweets, increased fruit and veggies. He also tried to restart exercise but hurt his back so he is not taking it slowly.  After the HbA1c returned higher, he was started on Metformin 500 mg daily by PCP but he could not tolerate it due to GI symptoms >> abdominal pain and diarrhea.   Reviewed HbA1c levels: Lab Results  Component Value Date   HGBA1C 5.7 (A) 01/02/2023   HGBA1C 6.5 09/11/2022   HGBA1C 5.8 (A) 03/27/2022   HGBA1C 6.0 (A) 05/17/2021   HGBA1C 6.0 (A) 11/09/2020   HGBA1C 5.7 (A) 02/02/2020   HGBA1C 6.5 08/26/2019   HGBA1C 6.1 (H) 02/03/2013    HGBA1C 6.2 05/27/2012   He is on: - Ozempic 0.25 >> 0.5 mg weekly - started 09/2022  He checks sugars 0-1x a day: - am: 80-90s >> 80-100 >> <100 - 2h after b'fast: n/c - lunch: n/c - 2h after lunch: n/c - dinner: n/c - 2h after dinner: n/c - bedtime: n/c It is unclear at which level he has hypoglycemia awareness.  Glucometer: One Touch verio  Pt's meals are: 2 meals a day now - varies: - Breakfast: Oatmeal + fruit (banana + grapes) 3x a week; eggs + grits + bacon/sausage; tuna sandwich or tuna salad; eggs + sardines + toast; granola bar >> more oatmeal + grapes + banana - Lunch: skips or peanuts or PB crackers; [employer] - Dinner:  Salmon or pork chop + veggies + rice or potatoes - Snacks: stopped icecream, juice + sodas, grapes, cookies  -+ Mild CKD, last BUN/creatinine:  Lab Results  Component Value Date   BUN 19 02/26/2023   BUN 13 01/09/2023   CREATININE 1.07 02/26/2023   CREATININE 1.09 01/09/2023  On valsartan.  -+ HL; last set of lipids: Lab Results  Component Value Date   CHOL 124 09/11/2022   HDL 50.20 09/11/2022   LDLCALC 60 09/11/2022   LDLDIRECT 139.2 11/21/2012   TRIG 69.0 09/11/2022   CHOLHDL 2 09/11/2022  On pravastatin 40.  - last eye exam  was in 09/2022: No DR reportedly; + glaucoma. Had cataract surgeries since last OV.  - no numbness and tingling in his feet.  Last foot exam 10/02/2022.  Pt has no FH of DM.  He has a history of stroke (left basal ganglia infarct) in 01/2013 -previously on Eliquis, now off.  He also has MGUS, vitiligo, h/o pericarditis.  He has a history of Graves' disease, s/p RAI treatment in the 13s.  Review latest TFTs: Lab Results  Component Value Date   TSH 0.83 09/11/2022   TSH 0.71 03/27/2022   TSH 0.83 05/17/2021   TSH 0.773 09/29/2019   TSH 0.67 08/26/2019   He is on Toprol-XL.  Since last visit, he was found to have a low B12.  He feels less tired after he became more compliant with the vitamin B12 and  vitamin D supplementation.  ROS: + See HPI  Past Medical History:  Diagnosis Date   ALLERGIC RHINITIS 01/25/2010   Arthritis    "neck, back, shoulders," (02/20/2017)   Chronic lower back pain    COVID 06/17/2021   has lingering cough that subsding   High cholesterol    Hx of cardiovascular stress test    a. Lexiscan Myoview 02/06/13:  EF 59%, no ischemia or scar   Hx of echocardiogram    a.  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   Vitiligo    Past Surgical History:  Procedure Laterality Date   A-FLUTTER ABLATION N/A 04/23/2019   Procedure: A-FLUTTER ABLATION;  Surgeon: Duke Salvia, MD;  Location: [facility] INVASIVE CV LAB;  Service: Cardiovascular;  Laterality: N/A;   ATRIAL FIBRILLATION ABLATION N/A 03/30/2021   Procedure: ATRIAL FIBRILLATION ABLATION;  Surgeon: Regan Lemming, MD;  Location: [facility] INVASIVE CV LAB;  Service: Cardiovascular;  Laterality: N/A;   ATRIAL FIBRILLATION ABLATION N/A 01/23/2023   Procedure: ATRIAL FIBRILLATION ABLATION;  Surgeon: Regan Lemming, MD;  Location: [facility] INVASIVE CV LAB;  Service: Cardiovascular;  Laterality: N/A;   BIOPSY  09/29/2019   Procedure: BIOPSY;  Surgeon: Tressia Danas, MD;  Location: [facility] ENDOSCOPY;  Service: Gastroenterology;;   CARDIOVERSION N/A 08/14/2018   Procedure: CARDIOVERSION;  Surgeon: Jake Bathe, MD;  Location: [facility] ENDOSCOPY;  Service: Cardiovascular;  Laterality: N/A;   CARDIOVERSION N/A 01/04/2021   Procedure: CARDIOVERSION;  Surgeon: Chrystie Nose, MD;  Location: [facility] ENDOSCOPY;  Service: Cardiovascular;   Laterality: N/A;   CATARACT EXTRACTION Left 09/2022   ESOPHAGOGASTRODUODENOSCOPY N/A 09/29/2019   Procedure: ESOPHAGOGASTRODUODENOSCOPY (EGD);  Surgeon: Tressia Danas, MD;  Location: [facility] ENDOSCOPY;  Service: Gastroenterology;  Laterality: N/A;   INGUINAL HERNIA REPAIR Bilateral    LUMBAR LAMINECTOMY     TEE WITHOUT CARDIOVERSION N/A 08/14/2018   Procedure: TRANSESOPHAGEAL ECHOCARDIOGRAM (TEE);  Surgeon: Jake Bathe, MD;  Location: [facility] ENDOSCOPY;  Service: Cardiovascular;  Laterality: N/A;   TEE WITHOUT CARDIOVERSION N/A 03/30/2021   Procedure: TRANSESOPHAGEAL ECHOCARDIOGRAM (TEE);  Surgeon: Chilton Si, MD;  Location: [facility] ENDOSCOPY;  Service: Cardiovascular;  Laterality: N/A;   UMBILICAL HERNIA REPAIR  Social History   Socioeconomic History   Marital status: Married    Spouse name: Not on file   Number of children: 2   Years of education: Not on file   Highest education level: Not on file  Occupational History   Occupation: Airline pilot   Occupation: retired  Tobacco Use   Smoking status: Former    Current packs/day: 0.00    Average packs/day: 0.5 packs/day for 8.0 years (4.0 ttl pk-yrs)    Types: Cigarettes    Start date: 65    Quit date: 1968    Years since quitting: 56.6   Smokeless tobacco: Never   Tobacco comments:    Former smoker 08/22/22  Vaping Use   Vaping status: Never Used  Substance and Sexual Activity   Alcohol use: Yes    Alcohol/week: 1.0 - 2.0 standard drink of alcohol    Types: 1 - 2 Shots of liquor per week    Comment: 02/20/2017 "couple drinks/month on average"   Drug use: No   Sexual activity: Yes  Other Topics Concern   Not on file  Social History Narrative   Not on file   Social Determinants of Health   Financial Resource Strain: Low Risk  (08/21/2022)   Overall Financial Resource Strain (CARDIA)    Difficulty of Paying Living Expenses: Not hard at all  Food Insecurity: No Food Insecurity (08/21/2022)   Hunger Vital Sign    Worried  About Running Out of Food in the Last Year: Never true    Ran Out of Food in the Last Year: Never true  Transportation Needs: No Transportation Needs (08/21/2022)   PRAPARE - Administrator, Civil Service (Medical): No    Lack of Transportation (Non-Medical): No  Physical Activity: Inactive (08/21/2022)   Exercise Vital Sign    Days of Exercise per Week: 0 days    Minutes of Exercise per Session: 0 min  Stress: No Stress Concern Present (08/21/2022)   [employer] of Occupational Health - Occupational Stress Questionnaire    Feeling of Stress : Not at all  Social Connections: Moderately Isolated (08/21/2022)   Social Connection and Isolation Panel [NHANES]    Frequency of Communication with Friends and Family: More than three times a week    Frequency of Social Gatherings with Friends and Family: More than three times a week    Attends Religious Services: Never    Database administrator or Organizations: No    Attends Banker Meetings: Never    Marital Status: Married  Catering manager Violence: Not At Risk (08/21/2022)   Humiliation, Afraid, Rape, and Kick questionnaire    Fear of Current or Ex-Partner: No    Emotionally Abused: No    Physically Abused: No    Sexually Abused: No   Current Outpatient Medications on File Prior to Visit  Medication Sig Dispense Refill   acetaminophen (TYLENOL) 500 MG tablet Take 1,000 mg by mouth every 8 (eight) hours as needed for mild pain or moderate pain.     Aloe-Sodium Chloride (AYR SALINE NASAL GEL NA) Place 1 application into the nose at bedtime as needed (congestion).     amoxicillin (AMOXIL) 875 MG tablet Take 1 tablet (875 mg total) by mouth 2 (two) times daily. (Patient not taking: Reported on 04/30/2023) 14 tablet 0   Ascorbic Acid (VITAMIN C PO) Take 900 mg by mouth 3 (three) times a week.     Blood Glucose Monitoring Suppl (ONETOUCH VERIO) w/Device KIT  Use as instructed to check blood sugar daily. 1 kit 0    Cholecalciferol (VITAMIN D3 PO) Take 1 tablet by mouth daily.     cyanocobalamin (VITAMIN B12) 1000 MCG tablet Take 1,000 mcg by mouth daily.     doxazosin (CARDURA) 2 MG tablet TAKE 1 TABLET BY MOUTH EVERYDAY AT BEDTIME 90 tablet 2   ELIQUIS 5 MG TABS tablet TAKE 1 TABLET BY MOUTH TWICE A DAY 180 tablet 1   finasteride (PROSCAR) 5 MG tablet Take 5 mg by mouth daily.     glucose blood (ONETOUCH VERIO) test strip Use as instructed 100 strip 3   Lancets (ONETOUCH ULTRASOFT) lancets Use as instructed to check blood sugar once daily 100 each 3   losartan (COZAAR) 50 MG tablet TAKE 1 TABLET BY MOUTH EVERY DAY 90 tablet 1   metoprolol succinate (TOPROL-XL) 100 MG 24 hr tablet TAKE 1 TABLET (100 MG TOTAL) BY MOUTH AT BEDTIME. TAKE WITH OR IMMEDIATELY FOLLOWING A MEAL. 90 tablet 3   OVER THE COUNTER MEDICATION Take 1 capsule by mouth at bedtime. OTC - Super Beta Prostate     pravastatin (PRAVACHOL) 40 MG tablet TAKE 1 TABLET BY MOUTH EVERYDAY AT BEDTIME 90 tablet 3   PRESCRIPTION MEDICATION Inhale into the lungs at bedtime. CPAP     Semaglutide,0.25 or 0.5MG /DOS, (OZEMPIC, 0.25 OR 0.5 MG/DOSE,) 2 MG/3ML SOPN Inject under skin 0.5 mg weekly 9 mL 3   sildenafil (VIAGRA) 100 MG tablet Take 100 mg by mouth daily as needed for erectile dysfunction.      No current facility-administered medications on file prior to visit.   Allergies  Allergen Reactions   Flomax [Tamsulosin Hcl] Itching   Relpax [Eletriptan] Other (See Comments) and Hypertension    Chest pain and sweating   Other Palpitations    Any steroids cause heart palpitations  (tolerates low doses)   Family History  Problem Relation Age of Onset   Stroke Father        Passed away in his 12s   Thyroid disease Sister        goiter   Sudden death Brother        ? Drug use, no autopsy   Throat cancer Brother    Colon cancer Neg Hx    Esophageal cancer Neg Hx    Rectal cancer Neg Hx    Stomach cancer Neg Hx    PE: BP 128/70   Pulse 82    Ht 6\' 2"  (1.88 m)   Wt 256 lb 6.4 oz (116.3 kg)   SpO2 95%   BMI 32.92 kg/m  Wt Readings from Last 3 Encounters:  07/03/23 256 lb 6.4 oz (116.3 kg)  04/30/23 242 lb 12.8 oz (110.1 kg)  02/20/23 266 lb 12.8 oz (121 kg)   Constitutional: overweight, in NAD Eyes: EOMI, no exophthalmos ENT: no thyromegaly, no cervical lymphadenopathy Cardiovascular: tachycardia, RR, No MRG Respiratory: CTA B Musculoskeletal: no deformities, Skin: + vitiligo rash over arms and neck, legs Neurological: + tremor with outstretched left hand  ASSESSMENT: 1. DM2, non-insulin-dependent  2.  History of Graves' disease  3.  Hyperlipidemia  PLAN:  1. Patient with history of fairly recently well-controlled diet's, developed likely due to being sedentary due to back pain and also getting steroid injections.  At last visit, HbA1c was lower, at 5.7% and sugars were all at goal.  He also lost 13 pounds from the previous visit, after switching to Ozempic and also improving diet.  We did not  change his regimen at that time. -He lost 15 more pounds since then! -At today's visit, sugars are still at goal but he is not checking them as frequently.  No need to change his regimen for now. -He does describe some palpitations occasionally when he goes to the restroom at night.  He did not check blood sugars at that time and I advised him to do so.  We also discussed about the possibility of having orthostasis at that time so it may be worthwhile checking his blood pressure at that time, also, especially as he describes that the applications are relieved by drinking water.  Advised him to stay very well-hydrated. - I suggested to:  Patient Instructions  Please continue: - Ozempic 0.5 mg weekly  Please return in 6 months with your sugar log.   - we checked his HbA1c: 5.6% (lower, excellent) - advised to check sugars at different times of the day - 1x a day, rotating check times - advised for yearly eye exams >> he is  UTD - return to clinic in 6 months months  2. H/o Graves' disease -No signs or symptoms of thyrotoxicosis -He is status post RAI treatment in the 5s -Latest TSH was normal: Lab Results  Component Value Date   TSH 0.83 09/11/2022  -Will continue to monitor these: He has another appointment with PCP in 09/2023.  Advised him to get the TSH checked at that time.  3.  Hyperlipidemia -Latest lipid panel showed fractions at goal: Lab Results  Component Value Date   CHOL 124 09/11/2022   HDL 50.20 09/11/2022   LDLCALC 60 09/11/2022   LDLDIRECT 139.2 11/21/2012   TRIG 69.0 09/11/2022   CHOLHDL 2 09/11/2022  -He continues on pravastatin 40 mg daily. -He has another appointment with PCP in 09/2023.  Carlus Pavlov, MD PhD [facility] Endocrinology

## 2023-07-03 NOTE — Patient Instructions (Signed)
Please continue: - Ozempic 0.5 mg weekly  Please return in 6 months with your sugar log.

## 2023-07-03 NOTE — Addendum Note (Signed)
Addended by: Pollie Meyer on: 07/03/2023 03:21 PM   Modules accepted: Orders

## 2023-08-07 ENCOUNTER — Other Ambulatory Visit: Payer: Self-pay | Admitting: Family Medicine

## 2023-08-07 ENCOUNTER — Other Ambulatory Visit: Payer: Self-pay | Admitting: Cardiology

## 2023-08-07 DIAGNOSIS — I4819 Other persistent atrial fibrillation: Secondary | ICD-10-CM

## 2023-08-07 NOTE — Telephone Encounter (Signed)
Prescription refill request for Eliquis received. Indication:afib Last office visit:6/24 Scr:1.07  4/24 Age: 77 Weight:116.3  kg  Prescription refilled

## 2023-08-13 ENCOUNTER — Telehealth: Payer: Self-pay

## 2023-08-13 ENCOUNTER — Ambulatory Visit: Payer: Medicare HMO | Admitting: Nurse Practitioner

## 2023-08-13 ENCOUNTER — Encounter: Payer: Self-pay | Admitting: Nurse Practitioner

## 2023-08-13 VITALS — BP 120/64 | HR 85 | Ht 74.0 in | Wt 258.1 lb

## 2023-08-13 DIAGNOSIS — Z860100 Personal history of colon polyps, unspecified: Secondary | ICD-10-CM

## 2023-08-13 DIAGNOSIS — I4819 Other persistent atrial fibrillation: Secondary | ICD-10-CM | POA: Diagnosis not present

## 2023-08-13 MED ORDER — NA SULFATE-K SULFATE-MG SULF 17.5-3.13-1.6 GM/177ML PO SOLN: 1.0000 | Freq: Once | ORAL | 0 refills | Status: AC

## 2023-08-13 NOTE — Telephone Encounter (Signed)
Patient Name: Gordon Phillips  DOB: [birth year] MRN: [id number]  Primary Cardiologist: Jodelle Red, MD  Procedure: colonoscopy Date of procedure: 09/18/23   CHA2DS2-VASc Score = 7  This indicates a 11.2% annual risk of stroke. The patient's score is based upon: CHF History: 1 HTN History: 1 Diabetes History: 1 Stroke History: 2 Vascular Disease History: 0 Age Score: 2 Gender Score: 0   Underwent afib ablation 01/23/23.   Stroke noted on CT in 2014   CrCl 68mL/min using adjusted body weight Platelet count 139K   Per office protocol, patient can hold Eliquis for 2 days prior to procedure as requested.   Please call with questions.  Joni Reining, NP 08/13/2023, 4:40 PM

## 2023-08-13 NOTE — Progress Notes (Unsigned)
08/13/2023 Gordon Phillips [id number] 03-Dec-1945   Chief Complaint: Discuss scheduling a colonoscopy   History of Present Illness: Gordon Phillips is a 77 year old male with a past medical history of arthritis, hypertension, atrial fibrillation s/p ablation sleep apnea uses CPAP, MGUS, chronic back pain and colon polyps He works 3 days weekly, walking, driving  Dental issues.   He endorses having severe bloat, constipation. Going back in the 1980's pain radiated from stomach to back  Episode a few years ago  3 years ago, bent over couldn't straighten out in [place] taking care of his mother  He took a laxative and pain went away  Last week constipated back pain, goes around to front. IBS. No BM that day.   On Ozempic.      Past Medical History:  Diagnosis Date   ALLERGIC RHINITIS 01/25/2010   Arthritis    "neck, back, shoulders," (02/20/2017)   Chronic lower back pain    COVID 06/17/2021   has lingering cough that subsding   High cholesterol    Hx of cardiovascular stress test    a. Lexiscan Myoview 02/06/13:  EF 59%, no ischemia or scar   Hx of echocardiogram    a.  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   Vitiligo         Latest Ref Rng & Units 02/26/2023    2:11 PM 01/09/2023   12:46 PM 08/28/2022   12:27 PM  CBC  WBC 4.0 - 10.5 K/uL 6.0  5.7  6.9   Hemoglobin 13.0 - 17.0 g/dL 40.9  81.1  91.4   Hematocrit 39.0 - 52.0 % 41.0  40.6  41.0   Platelets 150 - 400 K/uL 139  149  130         Latest Ref Rng & Units 02/26/2023    2:11 PM 01/09/2023   12:46 PM 08/28/2022    12:27 PM  CMP  Glucose 70 - 99 mg/dL 87  782  956   BUN 8 - 23 mg/dL 19  13  16    Creatinine 0.61 - 1.24 mg/dL 2.13  0.86  5.78   Sodium 135 - 145 mmol/L 140  141  138   Potassium 3.5 - 5.1 mmol/L 4.1  4.3  3.8   Chloride 98 - 111 mmol/L 105  104  107   CO2 22 - 32 mmol/L 29  22  27    Calcium 8.9 - 10.3 mg/dL 9.9  9.7  9.1   Total Protein 6.5 - 8.1 g/dL 7.8   7.1   Total Bilirubin 0.3 - 1.2 mg/dL 0.7   0.9   Alkaline Phos 38 - 126 U/L 56   45   AST 15 - 41 U/L 22   16   ALT 0 - 44 U/L 17   16      ECHO 07/31/2022: IMPRESSIONS Left ventricular ejection fraction, by estimation, is 50 to 55%. The left ventricle has low normal function. The left ventricle has no regional wall motion  abnormalities. There is moderate asymmetric left ventricular hypertrophy of the basal-septal segment. Left ventricular diastolic parameters are indeterminate. 1. 2. Right ventricular systolic function is normal. The right ventricular size is normal. The mitral valve is grossly normal. Trivial mitral valve regurgitation. No evidence of mitral stenosis. 3. The aortic valve has an indeterminant number of cusps- apperance of forme fruste aortic valve . There is moderate calcification of the aortic valve. Aortic valve regurgitation is not visualized. Aortic valve sclerosis/calcification is present, without any evidence of aortic stenosis. 4. The inferior vena cava is normal in size with greater than 50% respiratory variability, suggesting right atrial pressure of 3 mmHg.    IMPRESSION: 1. Multilevel disc disease and facet disease with multilevel disc protrusions, facet disease and ligamentum flavum thickening. 2. Multilevel multifactorial spinal, lateral recess and foraminal stenosis as discussed above at the individual levels. The most significant level is L5-S1 where there is a moderate to large broad-based disc protrusion with significant mass effect on the thecal sac more pronounced on the left side. This is  a new/progressive finding when compared to the prior MRI.  EGD 09/29/2019 by Dr. Orvan Falconer as an inpatient: - Ulceration was found at the epiglottis. The likely cause of his symptoms.  - Normal esophagus.  - Non-obstructing Schatzki ring.  - A small hiatal hernia.  - Erosive gastropathy with no bleeding and no stigmata of recent bleeding. Biopsied.  - Erythematous duodenopathy. Biopsied.  - The examination was otherwise normal.  Colonoscopy 05/14/2018 by Dr. Marina Goodell: - A 7 mm polyp was found in the transverse colon. The polyp was sessile. The polyp was removed with a cold snare. Resection and retrieval were complete.  - The exam was otherwise without abnormality on direct and retroflexion views.  - 5 year recall colonoscopy  - TUBULAR ADENOMA (TWO). - NO HIGH GRADE DYSPLASIA OR MALIGNANCY.  Current Medications, Allergies, Past Medical History, Past Surgical History, Family History and Social History were reviewed in [employer] record.   Review of Systems:   Constitutional: Negative for fever, sweats, chills or weight loss.  Respiratory: Negative for shortness of breath.   Cardiovascular: Negative for chest pain, palpitations and leg swelling.  Gastrointestinal: See HPI.  Musculoskeletal: Negative for back pain or muscle aches.  Neurological: Negative for dizziness, headaches or paresthesias.    Physical Exam: There were no vitals taken for this visit. General: in no acute distress. Head: Normocephalic and atraumatic. Eyes: No scleral icterus. Conjunctiva pink . Ears: Normal auditory acuity. Mouth: Dentition intact. No ulcers or lesions.  Lungs: Clear throughout to auscultation. Heart: Regular rate and rhythm, no murmur. Abdomen: Soft, nontender and nondistended. No masses or hepatomegaly. Normal bowel sounds x 4 quadrants.  Rectal: Deferred.  Musculoskeletal: Symmetrical with no gross deformities. Extremities: No edema. Neurological: Alert oriented x 4.  No focal deficits.  Psychological: Alert and cooperative. Normal mood and affect  Assessment and Recommendations:   Thrombocytopenia

## 2023-08-13 NOTE — Telephone Encounter (Signed)
Patient with diagnosis of afib on Eliquis for anticoagulation.    Procedure: colonoscopy Date of procedure: 09/18/23  CHA2DS2-VASc Score = 7  This indicates a 11.2% annual risk of stroke. The patient's score is based upon: CHF History: 1 HTN History: 1 Diabetes History: 1 Stroke History: 2 Vascular Disease History: 0 Age Score: 2 Gender Score: 0  Underwent afib ablation 01/23/23.  Stroke noted on CT in 2014  CrCl 52mL/min using adjusted body weight Platelet count 139K  Per office protocol, patient can hold Eliquis for 2 days prior to procedure as requested.  **This guidance is not considered finalized until pre-operative APP has relayed final recommendations.**

## 2023-08-13 NOTE — Telephone Encounter (Signed)
Pharmacy please advise on holding Eliquis prior to Endoscopy procedure scheduled for 09/18/2023. Thank you.

## 2023-08-13 NOTE — Patient Instructions (Addendum)
You have been scheduled for a colonoscopy. Please follow written instructions given to you at your visit today.   Please pick up your prep supplies at the pharmacy within the next 1-3 days.  If you use inhalers (even only as needed), please bring them with you on the day of your procedure.  DO NOT TAKE 7 DAYS PRIOR TO TEST- Trulicity (dulaglutide) Ozempic, Wegovy (semaglutide) Mounjaro (tirzepatide) Bydureon Bcise (exanatide extended release) _____________________________________________  Miralax- every night as needed for constipation  Due to recent changes in healthcare laws, you may see the results of your imaging and laboratory studies on MyChart before your provider has had a chance to review them.  We understand that in some cases there may be results that are confusing or concerning to you. Not all laboratory results come back in the same time frame and the provider may be waiting for multiple results in order to interpret others.  Please give [place] 48 hours in order for your provider to thoroughly review all the results before contacting the office for clarification of your results.   Thank you for trusting me with your gastrointestinal care!   Alcide Evener, CRNP

## 2023-08-13 NOTE — Telephone Encounter (Signed)
[facility] Pre-operative Risk Assessment     Request for surgical clearance:     Endoscopy Procedure  What type of surgery is being performed?     Colonoscopy  When is this surgery scheduled?     09/18/23  What type of clearance is required ?   Pharmacy  Are there any medications that need to be held prior to surgery and how long? Eliquis & 2 days  Practice name and name of physician performing surgery?      [facility]  What is your office phone and fax number?      Phone- [phone]  Fax- [phone]  Anesthesia type (None, local, MAC, general) ?       MAC

## 2023-08-14 ENCOUNTER — Telehealth: Payer: Self-pay

## 2023-08-14 NOTE — Progress Notes (Signed)
Gordon Phillips, pls contact patient as he knew I would be speaking with Dr. Marina Goodell regarding patients request for a colonoscopy. Pls let patient know that Dr. Marina Goodell did not assess a colonoscopy was warranted and that the  benefits do not outweigh the risks. Please cancel his Nov. 2024 colonoscopy date. Patient to take Miralax for constipation and he should speak to his PCP or endocrinologist regarding Ozempic which may be contributing to his symptoms. Patient can follow up in office in 3 month and PRN. THX.

## 2023-08-14 NOTE — Telephone Encounter (Signed)
Contacted pt & pt is aware to hold Eliquis 2 days prior to his procedure per Cardiology.

## 2023-08-15 ENCOUNTER — Telehealth: Payer: Self-pay

## 2023-08-15 NOTE — Telephone Encounter (Signed)
Unable to reach pt nor leave a voice message to to voice mailbox being full.

## 2023-08-15 NOTE — Telephone Encounter (Signed)
Message Received: Gordon Phillips, Gordon Carl, NP  Emeline Darling, RN      Previous Messages  Routed Note  Author: Arnaldo Natal, NP Service: Gastroenterology Author Type: Nurse Practitioner  Filed: 08/14/2023  3:04 PM Encounter Date: 08/13/2023 Status: Signed  Editor: Arnaldo Natal, NP (Nurse Practitioner)   Viviann Spare, pls contact patient as he knew I would be speaking with Dr. Marina Goodell regarding patients request for a colonoscopy. Pls let patient know that Dr. Marina Goodell did not assess a colonoscopy was warranted and that the  benefits do not outweigh the risks. Please cancel his Nov. 2024 colonoscopy date. Patient to take Miralax for constipation and he should speak to his PCP or endocrinologist regarding Ozempic which may be contributing to his symptoms. Patient can follow up in office in 3 month and PRN. THX.      Office Visit for Colonoscopy; Abdominal Pain 08/13/2023 Arnaldo Natal, NP - [facility] Diagnoses  History Of Colonic Polyps (Primary) Persistent atrial fibrillation (HCC) Orders Signed This Visit  (2) Ambulatory referral to Gastroenterology Na Sulfate-K Sulfate-Mg Sulf 17.5-3.13-1.6 GM/177ML SOLN Orders Pended This Visit  None Progress Notes  Arnaldo Natal, NP at 08/13/2023  1:30 PM  Status: Signed  Viviann Spare, pls contact patient as he knew I would be speaking with Dr. Marina Goodell regarding patients request for a colonoscopy. Pls let patient know that Dr. Marina Goodell did not assess a colonoscopy was warranted and that the  benefits do not outweigh the risks. Please cancel his Nov. 2024 colonoscopy date. Patient to take Miralax for constipation and he should speak to his PCP or endocrinologist regarding Ozempic which may be contributing to his symptoms. Patient can follow up in office in 3 month and PRN. THX.

## 2023-08-16 ENCOUNTER — Telehealth: Payer: Self-pay | Admitting: Nurse Practitioner

## 2023-08-16 NOTE — Telephone Encounter (Signed)
Gordon Phillips, refer to phone message 08/15/2023.  Please contact patient and let him know Dr. Marina Goodell assessed the patient did not require any further colonoscopies. Please cancel his November 2024 colonoscopy procedure date and please let me know when you successfully contact the patient.  I tried to call him at this time and he did not answer his phone.  His home line cut off and I was unable to leave a message on that phone.  Lease send him a letter with this information if you do not reach him directly.

## 2023-08-17 NOTE — Telephone Encounter (Signed)
Refer to phone note 08/15/23.

## 2023-08-17 NOTE — Telephone Encounter (Signed)
Unable to reach patient, VM box full. Tried to call wife's number, however disconnected.

## 2023-08-20 NOTE — Telephone Encounter (Signed)
Left message for pt to call back  °

## 2023-08-21 NOTE — Telephone Encounter (Signed)
Unable to reach patient, VM box full.

## 2023-08-22 NOTE — Telephone Encounter (Signed)
Unable to reach patient, VM box full.   Unable to reach pt after multiple attempts.  Procedure Canceled. Letter was created and sent to pt via mail. Routed as [employer]

## 2023-08-23 NOTE — Telephone Encounter (Signed)
Gordon Phillips, I called Gordon Phillips and he is aware that his colonoscopy was cancelled as recommended by Dr .Marina Goodell.  Dr. Marina Goodell, patient appreciates your recommendations.

## 2023-08-28 ENCOUNTER — Inpatient Hospital Stay: Payer: Medicare HMO | Attending: Hematology

## 2023-08-28 DIAGNOSIS — E559 Vitamin D deficiency, unspecified: Secondary | ICD-10-CM | POA: Diagnosis not present

## 2023-08-28 DIAGNOSIS — M549 Dorsalgia, unspecified: Secondary | ICD-10-CM | POA: Diagnosis not present

## 2023-08-28 DIAGNOSIS — D472 Monoclonal gammopathy: Secondary | ICD-10-CM | POA: Insufficient documentation

## 2023-08-28 DIAGNOSIS — E538 Deficiency of other specified B group vitamins: Secondary | ICD-10-CM | POA: Insufficient documentation

## 2023-08-28 DIAGNOSIS — D696 Thrombocytopenia, unspecified: Secondary | ICD-10-CM | POA: Diagnosis not present

## 2023-08-28 DIAGNOSIS — G8929 Other chronic pain: Secondary | ICD-10-CM | POA: Insufficient documentation

## 2023-08-28 LAB — CMP (CANCER CENTER ONLY)
ALT: 14 U/L (ref 0–44)
AST: 20 U/L (ref 15–41)
Albumin: 3.9 g/dL (ref 3.5–5.0)
Alkaline Phosphatase: 54 U/L (ref 38–126)
Anion gap: 4 — ABNORMAL LOW (ref 5–15)
BUN: 17 mg/dL (ref 8–23)
CO2: 27 mmol/L (ref 22–32)
Calcium: 9.2 mg/dL (ref 8.9–10.3)
Chloride: 109 mmol/L (ref 98–111)
Creatinine: 0.96 mg/dL (ref 0.61–1.24)
GFR, Estimated: >60 mL/min (ref >60)
Glucose, Bld: 95 mg/dL (ref 70–99)
Potassium: 4.1 mmol/L (ref 3.5–5.1)
Sodium: 140 mmol/L (ref 135–145)
Total Bilirubin: 1 mg/dL (ref 0.3–1.2)
Total Protein: 6.8 g/dL (ref 6.5–8.1)

## 2023-08-28 LAB — CBC WITH DIFFERENTIAL (CANCER CENTER ONLY)
Abs Immature Granulocytes: 0.02 10*3/uL (ref 0.00–0.07)
Basophils Absolute: 0 10*3/uL (ref 0.0–0.1)
Basophils Relative: 1 %
Eosinophils Absolute: 0.1 10*3/uL (ref 0.0–0.5)
Eosinophils Relative: 1 %
HCT: 38.7 % — ABNORMAL LOW (ref 39.0–52.0)
Hemoglobin: 13.1 g/dL (ref 13.0–17.0)
Immature Granulocytes: 0 %
Lymphocytes Relative: 28 %
Lymphs Abs: 1.7 10*3/uL (ref 0.7–4.0)
MCH: 30.7 pg (ref 26.0–34.0)
MCHC: 33.9 g/dL (ref 30.0–36.0)
MCV: 90.6 fL (ref 80.0–100.0)
Monocytes Absolute: 0.6 10*3/uL (ref 0.1–1.0)
Monocytes Relative: 10 %
Neutro Abs: 3.6 10*3/uL (ref 1.7–7.7)
Neutrophils Relative %: 60 %
Platelet Count: 144 10*3/uL — ABNORMAL LOW (ref 150–400)
RBC: 4.27 MIL/uL (ref 4.22–5.81)
RDW: 13.3 % (ref 11.5–15.5)
WBC Count: 6.1 10*3/uL (ref 4.0–10.5)
nRBC: 0 % (ref 0.0–0.2)

## 2023-08-29 LAB — KAPPA/LAMBDA LIGHT CHAINS
Kappa free light chain: 15.1 mg/L (ref 3.3–19.4)
Kappa, lambda light chain ratio: 1.78 — ABNORMAL HIGH (ref 0.26–1.65)
Lambda free light chains: 8.5 mg/L (ref 5.7–26.3)

## 2023-08-30 ENCOUNTER — Other Ambulatory Visit: Payer: Medicare HMO

## 2023-08-31 LAB — MULTIPLE MYELOMA PANEL, SERUM
Albumin SerPl Elph-Mcnc: 3.5 g/dL (ref 2.9–4.4)
Albumin/Glob SerPl: 1.2 (ref 0.7–1.7)
Alpha 1: 0.2 g/dL (ref 0.0–0.4)
Alpha2 Glob SerPl Elph-Mcnc: 0.9 g/dL (ref 0.4–1.0)
B-Globulin SerPl Elph-Mcnc: 0.8 g/dL (ref 0.7–1.3)
Gamma Glob SerPl Elph-Mcnc: 1.2 g/dL (ref 0.4–1.8)
Globulin, Total: 3.1 g/dL (ref 2.2–3.9)
IgA: 118 mg/dL (ref 61–437)
IgG (Immunoglobin G), Serum: 1171 mg/dL (ref 603–1613)
IgM (Immunoglobulin M), Srm: 71 mg/dL (ref 15–143)
M Protein SerPl Elph-Mcnc: 0.5 g/dL — ABNORMAL HIGH
Total Protein ELP: 6.6 g/dL (ref 6.0–8.5)

## 2023-09-03 ENCOUNTER — Encounter: Payer: Self-pay | Admitting: Hematology

## 2023-09-03 ENCOUNTER — Inpatient Hospital Stay: Payer: Medicare HMO | Admitting: Hematology

## 2023-09-03 VITALS — BP 120/75 | HR 82 | Temp 97.9°F | Resp 17 | Wt 262.1 lb

## 2023-09-03 DIAGNOSIS — D472 Monoclonal gammopathy: Secondary | ICD-10-CM

## 2023-09-03 NOTE — Progress Notes (Signed)
[facility]   Telephone:(336) [phone] Fax:(336) [phone]   Clinic Follow up Note   Patient Care Team: Ardith Dark, MD as PCP - General (Family Medicine) Jodelle Red, MD as PCP - Cardiology (Cardiology) Regan Lemming, MD as PCP - Electrophysiology (Cardiology) Malachy Mood, MD as Consulting Physician (Hematology) [facility] as Consulting Physician (Ophthalmology)  Date of Service:  09/03/2023  CHIEF COMPLAINT: f/u of MGUS  CURRENT THERAPY:  Observation  Oncology History   MGUS (monoclonal gammopathy of unknown significance) His initial SPEP revealed a low-level of monoclonal paraprotein (0.27g/dl), immunoglobulin level were normal, no anemia, renal dysfunction, or hypercalcemia -His previous CT scan revealed a small hyper lytic lesion in the C5 vertebral body however was not obvious on the MRI scan. -A bone survey showed subtle lucencies in multiple bones, indeterminate.  -Bone marrow biopsy showed 5% plasma cells, suggestive of plasma cell dyscrasia. Cytogenetics was normal. -M protein and other MM panel have remained stable; currently on q6 month lab and annual follow-up observation    Assessment and Plan    Monoclonal Gammopathy of Undetermined Significance (MGUS) Stable M protein level (0.5). No new bone pain or other symptoms suggestive of progression to multiple myeloma. -Lab reviewed, CBC and CMP are unremarkable -Continue monitoring with labs every 6 months and follow-up visit in 1 year.  Chronic Back Pain Ongoing issue, managed with intermittent steroid injections. -Continue current management plan with pain clinic.  Thrombocytopenia Mildly low platelet count, stable and asymptomatic. -No specific plan discussed, continue monitoring.  Vitamin B12 and D Deficiency Patient has been inconsistent with oral supplementation. -Encouraged to take supplements regularly.  Plan -Lab reviewed, overall stable, no  concerns -Will monitor lab every 6 months and follow-up in 1 year      Discussed the use of AI scribe software for clinical note transcription with the patient, who gave verbal consent to proceed.  History of Present Illness   The patient, with a history of MGUS, presents for a routine follow-up. He reports ongoing back pain, which is managed with intermittent steroid injections every two to three months. The patient notes that the injections provide relief, but he is unable to take a full dose due to side effects. He denies any new pain in other areas such as the hip, knee, or chest.  The patient also reports having undergone a cardiac ablation since his last visit, which was successful. He has been managing his back pain with injections every few months, but not consistently due to concerns about overuse of steroids.  The patient also has a history of low B12 and vitamin D levels, for which he has been taking oral supplements. He admits to not being consistent with his medication but has recently started taking it more regularly due to feelings of fatigue.         All other systems were reviewed with the patient and are negative.  MEDICAL HISTORY:  Past Medical History:  Diagnosis Date   ALLERGIC RHINITIS 01/25/2010   Arthritis    "neck, back, shoulders," (02/20/2017)   Chronic lower back pain    COVID 06/17/2021   has lingering cough that subsding   High cholesterol    Hx of cardiovascular stress test    a. Lexiscan Myoview 02/06/13:  EF 59%, no ischemia or scar   Hx of echocardiogram    a.  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   Vitiligo     SURGICAL HISTORY: Past Surgical History:  Procedure Laterality Date   A-FLUTTER ABLATION N/A 04/23/2019   Procedure: A-FLUTTER ABLATION;  Surgeon: Duke Salvia, MD;  Location: [facility] INVASIVE CV LAB;  Service: Cardiovascular;  Laterality: N/A;   ATRIAL FIBRILLATION ABLATION N/A 03/30/2021   Procedure: ATRIAL FIBRILLATION ABLATION;  Surgeon: Regan Lemming, MD;  Location: [facility] INVASIVE CV LAB;  Service: Cardiovascular;  Laterality: N/A;   ATRIAL FIBRILLATION ABLATION N/A 01/23/2023   Procedure: ATRIAL FIBRILLATION ABLATION;  Surgeon: Regan Lemming, MD;  Location: [facility] INVASIVE CV LAB;  Service: Cardiovascular;  Laterality: N/A;   BIOPSY  09/29/2019   Procedure: BIOPSY;  Surgeon: Tressia Danas, MD;  Location: [facility] ENDOSCOPY;  Service: Gastroenterology;;   CARDIOVERSION N/A 08/14/2018   Procedure: CARDIOVERSION;  Surgeon: Jake Bathe, MD;  Location[facility] ENDOSCOPY;  Service: Cardiovascular;  Laterality: N/A;   CARDIOVERSION N/A 01/04/2021   Procedure: CARDIOVERSION;  Surgeon: Chrystie Nose, MD;  Location: [facility] ENDOSCOPY;  Service: Cardiovascular;  Laterality: N/A;   CATARACT EXTRACTION Left 09/2022   ESOPHAGOGASTRODUODENOSCOPY N/A 09/29/2019   Procedure: ESOPHAGOGASTRODUODENOSCOPY (EGD);  Surgeon: Tressia Danas, MD;  Location: [facility] ENDOSCOPY;  Service: Gastroenterology;  Laterality: N/A;   INGUINAL HERNIA REPAIR Bilateral    LUMBAR LAMINECTOMY     TEE WITHOUT CARDIOVERSION N/A 08/14/2018   Procedure: TRANSESOPHAGEAL ECHOCARDIOGRAM (TEE);  Surgeon: Jake Bathe, MD;  Location: [facility] ENDOSCOPY;  Service: Cardiovascular;  Laterality: N/A;   TEE WITHOUT CARDIOVERSION N/A 03/30/2021   Procedure: TRANSESOPHAGEAL ECHOCARDIOGRAM (TEE);  Surgeon: Chilton Si, MD;  Location: [facility] ENDOSCOPY;  Service: Cardiovascular;  Laterality: N/A;   UMBILICAL HERNIA REPAIR      I have reviewed the social history and family history  with the patient and they are unchanged from previous note.  ALLERGIES:  is allergic to flomax [tamsulosin hcl], relpax [eletriptan], and other.  MEDICATIONS:  Current Outpatient Medications  Medication Sig Dispense Refill   acetaminophen (TYLENOL) 500 MG tablet Take 1,000 mg by mouth every 8 (eight) hours as needed for mild pain or moderate pain.     Aloe-Sodium Chloride (AYR SALINE NASAL GEL NA) Place 1 application into the nose at bedtime as needed (congestion).     Ascorbic Acid (VITAMIN C PO) Take 900 mg by mouth 3 (three) times a week.     Blood Glucose Monitoring Suppl (ONETOUCH VERIO) w/Device KIT Use as instructed to check blood sugar daily. 1 kit 0   Cholecalciferol (VITAMIN D3 PO) Take 1 tablet by mouth daily.     cyanocobalamin (VITAMIN B12) 1000 MCG tablet Take 1,000 mcg by mouth daily.     doxazosin (CARDURA) 2 MG tablet TAKE 1 TABLET BY MOUTH EVERYDAY AT BEDTIME 90 tablet 2   ELIQUIS 5 MG TABS tablet TAKE 1 TABLET BY MOUTH TWICE A DAY 180 tablet 1   finasteride (PROSCAR) 5 MG tablet Take 5 mg by mouth daily.     glucose blood (ONETOUCH VERIO) test strip Use as instructed 100 strip 3   Lancets (ONETOUCH ULTRASOFT) lancets Use as instructed to check blood sugar once  daily 100 each 3   losartan (COZAAR) 50 MG tablet TAKE 1 TABLET BY MOUTH EVERY DAY 90 tablet 1   metoprolol succinate (TOPROL-XL) 100 MG 24 hr tablet TAKE 1 TABLET (100 MG TOTAL) BY MOUTH AT BEDTIME. TAKE WITH OR IMMEDIATELY FOLLOWING A MEAL. 90 tablet 3   Na Sulfate-K Sulfate-Mg Sulf 17.5-3.13-1.6 GM/177ML SOLN Take by mouth as directed.     OVER THE COUNTER MEDICATION Take 1 capsule by mouth at bedtime. OTC - Super Beta Prostate     PRESCRIPTION MEDICATION Inhale into the lungs at bedtime. CPAP     Semaglutide,0.25 or 0.5MG /DOS, (OZEMPIC, 0.25 OR 0.5 MG/DOSE,) 2 MG/3ML SOPN Inject under skin 0.5 mg weekly 9 mL 3   sildenafil (VIAGRA) 100 MG tablet Take 100 mg by mouth daily as needed for erectile dysfunction.       pravastatin (PRAVACHOL) 40 MG tablet TAKE 1 TABLET BY MOUTH EVERYDAY AT BEDTIME 90 tablet 3   No current facility-administered medications for this visit.    PHYSICAL EXAMINATION: ECOG PERFORMANCE STATUS: 2 - Symptomatic, <50% confined to bed  Vitals:   09/03/23 1424  BP: 120/75  Pulse: 82  Resp: 17  Temp: 97.9 F (36.6 C)  SpO2: 96%   Wt Readings from Last 3 Encounters:  09/03/23 262 lb 1.6 oz (118.9 kg)  08/13/23 258 lb 2 oz (117.1 kg)  07/03/23 256 lb 6.4 oz (116.3 kg)     GENERAL:alert, no distress and comfortable SKIN: skin color, texture, turgor are normal, no rashes or significant lesions EYES: normal, Conjunctiva are pink and non-injected, sclera clear NECK: supple, thyroid normal size, non-tender, without nodularity LYMPH:  no palpable lymphadenopathy in the cervical, axillary  LUNGS: clear to auscultation and percussion with normal breathing effort HEART: regular rate & rhythm and no murmurs and no lower extremity edema ABDOMEN:abdomen soft, non-tender and normal bowel sounds Musculoskeletal:no cyanosis of digits and no clubbing  NEURO: alert & oriented x 3 with fluent speech, no focal motor/sensory deficits   LABORATORY DATA:  I have reviewed the data as listed    Latest Ref Rng & Units 08/28/2023    2:13 PM 02/26/2023    2:11 PM 01/09/2023   12:46 PM  CBC  WBC 4.0 - 10.5 K/uL 6.1  6.0  5.7   Hemoglobin 13.0 - 17.0 g/dL 16.1  09.6  04.5   Hematocrit 39.0 - 52.0 % 38.7  41.0  40.6   Platelets 150 - 400 K/uL 144  139  149         Latest Ref Rng & Units 08/28/2023    2:13 PM 02/26/2023    2:11 PM 01/09/2023   12:46 PM  CMP  Glucose 70 - 99 mg/dL 95  87  409   BUN 8 - 23 mg/dL 17  19  13    Creatinine 0.61 - 1.24 mg/dL 8.11  9.14  7.82   Sodium 135 - 145 mmol/L 140  140  141   Potassium 3.5 - 5.1 mmol/L 4.1  4.1  4.3   Chloride 98 - 111 mmol/L 109  105  104   CO2 22 - 32 mmol/L 27  29  22    Calcium 8.9 - 10.3 mg/dL 9.2  9.9  9.7   Total Protein 6.5 -  8.1 g/dL 6.8  7.8    Total Bilirubin 0.3 - 1.2 mg/dL 1.0  0.7    Alkaline Phos 38 - 126 U/L 54  56    AST 15 - 41 U/L 20  22    ALT 0 - 44 U/L 14  17        RADIOGRAPHIC STUDIES: I have personally reviewed the radiological images as listed and agreed with the findings in the report. No results found.    No orders of the defined types were placed in this encounter.  All questions were answered. The patient knows to call the clinic with any problems, questions or concerns. No barriers to learning was detected. The total time spent in the appointment was 15 minutes.     Malachy Mood, MD 09/03/2023

## 2023-09-03 NOTE — Assessment & Plan Note (Signed)
His initial SPEP revealed a low-level of monoclonal paraprotein (0.27g/dl), immunoglobulin level were normal, no anemia, renal dysfunction, or hypercalcemia -His previous CT scan revealed a small hyper lytic lesion in the C5 vertebral body however was not obvious on the MRI scan. -A bone survey showed subtle lucencies in multiple bones, indeterminate.  -Bone marrow biopsy showed 5% plasma cells, suggestive of plasma cell dyscrasia. Cytogenetics was normal. -M protein and other MM panel have remained stable; currently on q6 month lab and annual follow-up observation

## 2023-09-04 ENCOUNTER — Ambulatory Visit: Payer: Medicare HMO | Admitting: Nurse Practitioner

## 2023-09-10 ENCOUNTER — Ambulatory Visit (INDEPENDENT_AMBULATORY_CARE_PROVIDER_SITE_OTHER): Payer: Medicare HMO

## 2023-09-10 VITALS — Wt 256.0 lb

## 2023-09-10 DIAGNOSIS — Z0000 Encounter for general adult medical examination without abnormal findings: Secondary | ICD-10-CM

## 2023-09-10 NOTE — Progress Notes (Signed)
Subjective:   Gordon Phillips is a 77 y.o. male who presents for Medicare Annual/Subsequent preventive examination.  Visit Complete: Virtual I connected with  Gordon Phillips on 09/10/23 by a audio enabled telemedicine application and verified that I am speaking with the correct person using two identifiers.  Patient Location: Home  Provider Location: Office/Clinic  I discussed the limitations of evaluation and management by telemedicine. The patient expressed understanding and agreed to proceed.  Vital Signs: Because this visit was a virtual/telehealth visit, some criteria may be missing or patient reported. Any vitals not documented were not able to be obtained and vitals that have been documented are patient reported.   Cardiac Risk Factors include: advanced age (>48men, >40 women);diabetes mellitus;dyslipidemia;hypertension;male gender;obesity (BMI >30kg/m2)     Objective:    Today's Vitals   09/10/23 1334  Weight: 256 lb (116.1 kg)   Body mass index is 32.87 kg/m.     09/10/2023    1:40 PM 01/23/2023   12:12 PM 08/21/2022    1:09 PM 08/08/2021   12:00 PM 01/04/2021    7:32 AM 12/21/2020    7:30 PM 07/29/2020   11:58 AM  Advanced Directives  Does Patient Have a Medical Advance Directive? No Yes Yes No No No No  Type of Special educational needs teacher of [place];Living will Healthcare Power of [place];Living will      Does patient want to make changes to medical advance directive?       Yes (MAU/Ambulatory/Procedural Areas - Information given)  Copy of Healthcare Power of Attorney in Chart?   No - copy requested      Would patient like information on creating a medical advance directive? No - Patient declined   Yes (MAU/Ambulatory/Procedural Areas - Information given) No - Patient declined No - Patient declined     Current Medications (verified) Outpatient Encounter Medications as of 09/10/2023  Medication Sig   acetaminophen (TYLENOL) 500 MG tablet Take 1,000 mg by mouth  every 8 (eight) hours as needed for mild pain or moderate pain.   Aloe-Sodium Chloride (AYR SALINE NASAL GEL NA) Place 1 application into the nose at bedtime as needed (congestion).   Ascorbic Acid (VITAMIN C PO) Take 900 mg by mouth 3 (three) times a week.   Blood Glucose Monitoring Suppl (ONETOUCH VERIO) w/Device KIT Use as instructed to check blood sugar daily.   Cholecalciferol (VITAMIN D3 PO) Take 1 tablet by mouth daily.   cyanocobalamin (VITAMIN B12) 1000 MCG tablet Take 1,000 mcg by mouth daily.   doxazosin (CARDURA) 2 MG tablet TAKE 1 TABLET BY MOUTH EVERYDAY AT BEDTIME   ELIQUIS 5 MG TABS tablet TAKE 1 TABLET BY MOUTH TWICE A DAY   finasteride (PROSCAR) 5 MG tablet Take 5 mg by mouth daily.   glucose blood (ONETOUCH VERIO) test strip Use as instructed   Lancets (ONETOUCH ULTRASOFT) lancets Use as instructed to check blood sugar once daily   losartan (COZAAR) 50 MG tablet TAKE 1 TABLET BY MOUTH EVERY DAY   metoprolol succinate (TOPROL-XL) 100 MG 24 hr tablet TAKE 1 TABLET (100 MG TOTAL) BY MOUTH AT BEDTIME. TAKE WITH OR IMMEDIATELY FOLLOWING A MEAL.   OVER THE COUNTER MEDICATION Take 1 capsule by mouth at bedtime. OTC - Super Beta Prostate   pravastatin (PRAVACHOL) 40 MG tablet TAKE 1 TABLET BY MOUTH EVERYDAY AT BEDTIME   PRESCRIPTION MEDICATION Inhale into the lungs at bedtime. CPAP   Semaglutide,0.25 or 0.5MG /DOS, (OZEMPIC, 0.25 OR 0.5 MG/DOSE,) 2 MG/3ML SOPN Inject  under skin 0.5 mg weekly   sildenafil (VIAGRA) 100 MG tablet Take 100 mg by mouth daily as needed for erectile dysfunction.    [DISCONTINUED] Na Sulfate-K Sulfate-Mg Sulf 17.5-3.13-1.6 GM/177ML SOLN Take by mouth as directed.   No facility-administered encounter medications on file as of 09/10/2023.    Allergies (verified) Flomax [tamsulosin hcl], Relpax [eletriptan], and Other   History: Past Medical History:  Diagnosis Date   ALLERGIC RHINITIS 01/25/2010   Arthritis    "neck, back, shoulders," (02/20/2017)    Chronic lower back pain    COVID 06/17/2021   has lingering cough that subsding   High cholesterol    Hx of cardiovascular stress test    a. Lexiscan Myoview 02/06/13:  EF 59%, no ischemia or scar   Hx of echocardiogram    a.  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   Vitiligo    Past Surgical History:  Procedure Laterality Date   A-FLUTTER ABLATION N/A 04/23/2019   Procedure: A-FLUTTER ABLATION;  Surgeon: Duke Salvia, MD;  Location: [facility] INVASIVE CV LAB;  Service: Cardiovascular;  Laterality: N/A;   ATRIAL FIBRILLATION ABLATION N/A 03/30/2021   Procedure: ATRIAL FIBRILLATION ABLATION;  Surgeon: Regan Lemming, MD;  Location: [facility] INVASIVE CV LAB;  Service: Cardiovascular;  Laterality: N/A;   ATRIAL FIBRILLATION ABLATION N/A 01/23/2023   Procedure: ATRIAL FIBRILLATION ABLATION;  Surgeon: Regan Lemming, MD;  Location: [facility] INVASIVE CV LAB;  Service: Cardiovascular;  Laterality: N/A;   BIOPSY  09/29/2019   Procedure: BIOPSY;  Surgeon: Tressia Danas, MD;  Location: [facility] ENDOSCOPY;  Service: Gastroenterology;;   CARDIOVERSION N/A 08/14/2018   Procedure: CARDIOVERSION;  Surgeon: Jake Bathe, MD;  Location: [facility] ENDOSCOPY;  Service: Cardiovascular;  Laterality: N/A;   CARDIOVERSION N/A 01/04/2021   Procedure: CARDIOVERSION;  Surgeon: Chrystie Nose, MD;  Location: [facility] ENDOSCOPY;  Service: Cardiovascular;  Laterality: N/A;   CATARACT EXTRACTION Left 09/2022   ESOPHAGOGASTRODUODENOSCOPY N/A 09/29/2019   Procedure: ESOPHAGOGASTRODUODENOSCOPY (EGD);  Surgeon: Tressia Danas, MD;   Location: [facility] ENDOSCOPY;  Service: Gastroenterology;  Laterality: N/A;   INGUINAL HERNIA REPAIR Bilateral    LUMBAR LAMINECTOMY     TEE WITHOUT CARDIOVERSION N/A 08/14/2018   Procedure: TRANSESOPHAGEAL ECHOCARDIOGRAM (TEE);  Surgeon: Jake Bathe, MD;  Location: [facility] ENDOSCOPY;  Service: Cardiovascular;  Laterality: N/A;   TEE WITHOUT CARDIOVERSION N/A 03/30/2021   Procedure: TRANSESOPHAGEAL ECHOCARDIOGRAM (TEE);  Surgeon: Chilton Si, MD;  Location: [facility] ENDOSCOPY;  Service: Cardiovascular;  Laterality: N/A;   UMBILICAL HERNIA REPAIR     Family History  Problem Relation Age of Onset   Stroke Father        Passed away in his 34s   Thyroid disease Sister        goiter   Sudden death Brother        ? Drug use, no autopsy   Throat cancer Brother    Colon cancer Neg Hx    Esophageal cancer Neg Hx    Rectal cancer Neg Hx    Stomach  cancer Neg Hx    Social History   Socioeconomic History   Marital status: Married    Spouse name: Not on file   Number of children: 2   Years of education: Not on file   Highest education level: Not on file  Occupational History   Occupation: Airline pilot   Occupation: retired  Tobacco Use   Smoking status: Former    Current packs/day: 0.00    Average packs/day: 0.5 packs/day for 8.0 years (4.0 ttl pk-yrs)    Types: Cigarettes    Start date: 57    Quit date: 1968    Years since quitting: 56.8   Smokeless tobacco: Never   Tobacco comments:    Former smoker 08/22/22  Vaping Use   Vaping status: Never Used  Substance and Sexual Activity   Alcohol use: Yes    Alcohol/week: 1.0 - 2.0 standard drink of alcohol    Types: 1 - 2 Shots of liquor per week    Comment: socially   Drug use: No   Sexual activity: Yes  Other Topics Concern   Not on file  Social History Narrative   Not on file   Social Determinants of Health   Financial Resource Strain: Low Risk  (09/10/2023)   Overall Financial Resource Strain (CARDIA)    Difficulty of Paying Living  Expenses: Not hard at all  Food Insecurity: No Food Insecurity (09/10/2023)   Hunger Vital Sign    Worried About Running Out of Food in the Last Year: Never true    Ran Out of Food in the Last Year: Never true  Transportation Needs: No Transportation Needs (09/10/2023)   PRAPARE - Administrator, Civil Service (Medical): No    Lack of Transportation (Non-Medical): No  Physical Activity: Sufficiently Active (09/10/2023)   Exercise Vital Sign    Days of Exercise per Week: 3 days    Minutes of Exercise per Session: 60 min  Stress: No Stress Concern Present (09/10/2023)   [employer] of Occupational Health - Occupational Stress Questionnaire    Feeling of Stress : Not at all  Social Connections: Moderately Isolated (09/10/2023)   Social Connection and Isolation Panel [NHANES]    Frequency of Communication with Friends and Family: More than three times a week    Frequency of Social Gatherings with Friends and Family: More than three times a week    Attends Religious Services: Never    Database administrator or Organizations: No    Attends Banker Meetings: Never    Marital Status: Married    Tobacco Counseling Counseling given: Not Answered Tobacco comments: Former smoker 08/22/22   Clinical Intake:  Pre-visit preparation completed: Yes  Pain : No/denies pain     BMI - recorded: 32.87 Nutritional Status: BMI > 30  Obese Nutritional Risks: None Diabetes: Yes CBG done?: No Did pt. bring in CBG monitor from home?: No  How often do you need to have someone help you when you read instructions, pamphlets, or other written materials from your doctor or pharmacy?: 1 - Never  Interpreter Needed?: No  Information entered by :: Lanier Ensign, LPN   Activities of Daily Living    09/10/2023    1:36 PM  In your present state of health, do you have any difficulty performing the following activities:  Hearing? 0  Vision? 0  Difficulty concentrating or  making decisions? 0  Walking or climbing stairs? 0  Dressing or bathing? 0  Doing errands, shopping?  0  Preparing Food and eating ? N  Using the Toilet? N  In the past six months, have you accidently leaked urine? N  Do you have problems with loss of bowel control? N  Managing your Medications? N  Managing your Finances? N  Housekeeping or managing your Housekeeping? N    Patient Care Team: Ardith Dark, MD as PCP - General (Family Medicine) Jodelle Red, MD as PCP - Cardiology (Cardiology) Regan Lemming, MD as PCP - Electrophysiology (Cardiology) Malachy Mood, MD as Consulting Physician (Hematology) [facility] as Consulting Physician (Ophthalmology)  Indicate any recent Medical Services you may have received from other than [facility] providers in the past year (date may be approximate).     Assessment:   This is a routine wellness examination for [facility].  Hearing/Vision screen Hearing Screening - Comments:: Pt denies any hearing issues  Vision Screening - Comments:: Pt follows up with Dr Dione Booze for annual eye exams    Goals Addressed             This Visit's Progress    Patient Stated       Continue to exercise and lose weight        Depression Screen    09/10/2023    1:40 PM 01/16/2023    2:17 PM 09/11/2022   12:55 PM 08/21/2022    1:07 PM 03/20/2022    1:11 PM 09/13/2021    1:22 PM 08/08/2021   11:59 AM  PHQ 2/9 Scores  PHQ - 2 Score 0 0 0 0 0 0 0    Fall Risk    09/10/2023    1:42 PM 01/16/2023    2:17 PM 09/11/2022   12:55 PM 08/21/2022    1:09 PM 03/20/2022    1:11 PM  Fall Risk   Falls in the past year? 0 0 0 0 0  Number falls in past yr: 0 0 0 0 0  Injury with Fall? 0 0 0 0 0  Risk for fall due to : No Fall Risks No Fall Risks No Fall Risks Impaired vision No Fall Risks  Follow up Falls prevention discussed   Falls prevention discussed     MEDICARE RISK AT HOME: Medicare Risk at Home Any stairs in or around the  home?: Yes If so, are there any without handrails?: No Home free of loose throw rugs in walkways, pet beds, electrical cords, etc?: Yes Adequate lighting in your home to reduce risk of falls?: Yes Life alert?: No Use of a cane, walker or w/c?: No Grab bars in the bathroom?: No Shower chair or bench in shower?: No Elevated toilet seat or a handicapped toilet?: No  TIMED UP AND GO:  Was the test performed?  No    Cognitive Function:        09/10/2023    1:42 PM 08/21/2022    1:10 PM 08/08/2021   12:02 PM 07/29/2020   12:08 PM 07/03/2019    1:43 PM  6CIT Screen  What Year? 0 points 0 points 0 points 0 points 0 points  What month? 0 points 0 points 0 points 0 points 0 points  What time? 0 points 0 points 0 points  0 points  Count back from 20 0 points 0 points 0 points 0 points 0 points  Months in reverse 0 points 0 points 0 points 0 points 0 points  Repeat phrase 0 points 0 points 0 points 2 points 0 points  Total Score 0  points 0 points 0 points  0 points    Immunizations Immunization History  Administered Date(s) Administered   Fluad Quad(high Dose 65+) 08/26/2019, 09/07/2020, 09/13/2021, 09/11/2022   Influenza Split 08/10/2011, 08/21/2012   Influenza, High Dose Seasonal PF 08/24/2017   Influenza,inj,Quad PF,6+ Mos 08/07/2013, 08/10/2014, 08/12/2018   Influenza-Unspecified 06/20/2016   PFIZER(Purple Top)SARS-COV-2 Vaccination 12/18/2019, 01/12/2020, 10/14/2020   Pfizer Covid-19 Vaccine Bivalent Booster 71yrs & up 11/21/2021   Pneumococcal Conjugate-13 08/10/2014   Pneumococcal Polysaccharide-23 03/20/2016   Tdap 12/14/2014   Zoster Recombinant(Shingrix) 08/13/2021, 11/21/2021    TDAP status: Up to date  Flu Vaccine status: Due, Education has been provided regarding the importance of this vaccine. Advised may receive this vaccine at local pharmacy or Health Dept. Aware to provide a copy of the vaccination record if obtained from local pharmacy or Health Dept. Verbalized  acceptance and understanding.  Pneumococcal vaccine status: Up to date  Covid-19 vaccine status: Information provided on how to obtain vaccines.   Qualifies for Shingles Vaccine? Yes   Zostavax completed Yes   Shingrix Completed?: Yes  Screening Tests Health Maintenance  Topic Date Due   Diabetic kidney evaluation - Urine ACR  Never done   Colonoscopy  05/15/2023   INFLUENZA VACCINE  06/07/2023   COVID-19 Vaccine (5 - 2023-24 season) 07/08/2023   OPHTHALMOLOGY EXAM  09/11/2023   FOOT EXAM  10/03/2023   HEMOGLOBIN A1C  01/03/2024   Diabetic kidney evaluation - eGFR measurement  08/27/2024   Medicare Annual Wellness (AWV)  09/09/2024   DTaP/Tdap/Td (2 - Td or Tdap) 12/14/2024   Pneumonia Vaccine 31+ Years old  Completed   Hepatitis C Screening  Completed   Zoster Vaccines- Shingrix  Completed   HPV VACCINES  Aged Out    Health Maintenance  Health Maintenance Due  Topic Date Due   Diabetic kidney evaluation - Urine ACR  Never done   Colonoscopy  05/15/2023   INFLUENZA VACCINE  06/07/2023   COVID-19 Vaccine (5 - 2023-24 season) 07/08/2023    Colorectal cancer screening: No longer required.    Additional Screening:  Hepatitis C Screening:  Completed 12/14/14  Vision Screening: Recommended annual ophthalmology exams for early detection of glaucoma and other disorders of the eye. Is the patient up to date with their annual eye exam?  Yes  Who is the provider or what is the name of the office in which the patient attends annual eye exams? Dr Dione Booze  If pt is not established with a provider, would they like to be referred to a provider to establish care? No .   Dental Screening: Recommended annual dental exams for proper oral hygiene  Diabetic Foot Exam: Diabetic Foot Exam: Completed 10/02/22  Community Resource Referral / Chronic Care Management: CRR required this visit?  No   CCM required this visit?  No     Plan:     I have personally reviewed and noted the  following in the patient's chart:   Medical and social history Use of alcohol, tobacco or illicit drugs  Current medications and supplements including opioid prescriptions. Patient is not currently taking opioid prescriptions. Functional ability and status Nutritional status Physical activity Advanced directives List of other physicians Hospitalizations, surgeries, and ER visits in previous 12 months Vitals Screenings to include cognitive, depression, and falls Referrals and appointments  In addition, I have reviewed and discussed with patient certain preventive protocols, quality metrics, and best practice recommendations. A written personalized care plan for preventive services as well as general preventive  health recommendations were provided to patient.     Marzella Schlein, LPN   16/11/958   After Visit Summary: (MyChart) Due to this being a telephonic visit, the after visit summary with patients personalized plan was offered to patient via MyChart   Nurse Notes: none

## 2023-09-10 NOTE — Patient Instructions (Signed)
Gordon Phillips , Thank you for taking time to come for your Medicare Wellness Visit. I appreciate your ongoing commitment to your health goals. Please review the following plan we discussed and let me know if I can assist you in the future.   Referrals/Orders/Follow-Ups/Clinician Recommendations: Aim for 30 minutes of exercise or brisk walking, 6-8 glasses of water, and 5 servings of fruits and vegetables each day.   This is a list of the screening recommended for you and due dates:  Health Maintenance  Topic Date Due   Yearly kidney health urinalysis for diabetes  Never done   Colon Cancer Screening  05/15/2023   Flu Shot  06/07/2023   COVID-19 Vaccine (5 - 2023-24 season) 07/08/2023   Eye exam for diabetics  09/11/2023   Complete foot exam   10/03/2023   Hemoglobin A1C  01/03/2024   Yearly kidney function blood test for diabetes  08/27/2024   Medicare Annual Wellness Visit  09/09/2024   DTaP/Tdap/Td vaccine (2 - Td or Tdap) 12/14/2024   Pneumonia Vaccine  Completed   Hepatitis C Screening  Completed   Zoster (Shingles) Vaccine  Completed   HPV Vaccine  Aged Out    Advanced directives: (Declined) Advance directive discussed with you today. Even though you declined this today, please call our office should you change your mind, and we can give you the proper paperwork for you to fill out.  Next Medicare Annual Wellness Visit scheduled for next year: Yes

## 2023-09-18 ENCOUNTER — Encounter: Payer: Medicare HMO | Admitting: Internal Medicine

## 2023-09-18 ENCOUNTER — Encounter: Payer: Medicare HMO | Admitting: Family Medicine

## 2023-10-22 ENCOUNTER — Ambulatory Visit (HOSPITAL_COMMUNITY)
Admission: RE | Admit: 2023-10-22 | Discharge: 2023-10-22 | Disposition: A | Discharge status: Home / Self Care | Payer: Medicare HMO | Source: Ambulatory Visit | Attending: Physician Assistant | Admitting: Physician Assistant

## 2023-10-22 ENCOUNTER — Encounter (HOSPITAL_COMMUNITY): Payer: Self-pay | Admitting: Physician Assistant

## 2023-10-22 VITALS — BP 126/70 | HR 85 | Ht 74.0 in | Wt 260.0 lb

## 2023-10-22 DIAGNOSIS — G4733 Obstructive sleep apnea (adult) (pediatric): Secondary | ICD-10-CM | POA: Diagnosis not present

## 2023-10-22 DIAGNOSIS — I4819 Other persistent atrial fibrillation: Secondary | ICD-10-CM | POA: Insufficient documentation

## 2023-10-22 DIAGNOSIS — D6869 Other thrombophilia: Secondary | ICD-10-CM | POA: Diagnosis not present

## 2023-10-22 DIAGNOSIS — E669 Obesity, unspecified: Secondary | ICD-10-CM | POA: Insufficient documentation

## 2023-10-22 DIAGNOSIS — I4892 Unspecified atrial flutter: Secondary | ICD-10-CM | POA: Diagnosis present

## 2023-10-22 DIAGNOSIS — Z7901 Long term (current) use of anticoagulants: Secondary | ICD-10-CM | POA: Diagnosis not present

## 2023-10-22 DIAGNOSIS — Z8673 Personal history of transient ischemic attack (TIA), and cerebral infarction without residual deficits: Secondary | ICD-10-CM | POA: Insufficient documentation

## 2023-10-22 DIAGNOSIS — Z6833 Body mass index (BMI) 33.0-33.9, adult: Secondary | ICD-10-CM | POA: Diagnosis not present

## 2023-10-22 DIAGNOSIS — I10 Essential (primary) hypertension: Secondary | ICD-10-CM | POA: Diagnosis not present

## 2023-10-22 NOTE — Progress Notes (Signed)
Primary Care Physician: Ardith Dark, MD Primary Cardiologist: Dr Cristal Deer  Primary Electrophysiologist: Dr Elberta Fortis Referring Physician: [facility]   Gordon Phillips is a 77 y.o. male with a history of HTN, OSA w/CPAP, MUGUS, CVA (by imaging), remote pericarditis, CBP, vitiligo and migraines, atrial flutter s/p ablation, and atrial fibrillation who presents for follow up in the [facility]. The patient was initially diagnosed with atrial fibrillation and atrial flutter in  2019. He underwent flutter ablation with Dr Graciela Husbands on 04/23/19. Patient is on Eliquis for a CHADS2VASC score of 5. He was in his usual state of health until 12/20/20 when he had tachypalpitations while walking. The next day, his palpitations worsened and he also developed chest pain and presented to the ED. ECG showed rapid afib. He was not cardioverted at that time because it was unclear if he was paroxysmal or persistent. He denies any significant alcohol use. Patient is s/p DCCV on 01/04/21. He reports that initially he felt "100%" but did have fatigue with exertion a few days later and was found to be in atrial flutter.  Patient is s/p afib ablation with Dr Elberta Fortis on 03/30/21. He was found to be back in afib at his visit on 05/29/22. Seen by Otilio Saber on 07/17/22 and a monitor was placed which showed 100% afib burden. He is now s/p repeat ablation on 01/23/23.  On follow up today, patient reports that he has done well since his last visit. He has had no symptoms of afib since his ablation. No bleeding issues on anticoagulation.   Today, he denies symptoms of palpitations, chest pain, shortness of breath, orthopnea, PND, lower extremity edema, dizziness, presyncope, syncope, bleeding, or neurologic sequela. The patient is tolerating medications without difficulties and is otherwise without complaint today.    Atrial Fibrillation Risk Factors:  he does have symptoms or diagnosis of sleep  apnea. he is compliant with CPAP therapy. he does not have a history of rheumatic fever. he does not have a history of alcohol use. The patient does not have a history of early familial atrial fibrillation or other arrhythmias.   Atrial Fibrillation Management history:  Previous antiarrhythmic drugs: none Previous cardioversions: 2019, 01/04/21 Previous ablations: 04/23/19, 03/30/21, 01/23/23 Anticoagulation history: Eliquis   Past Medical History:  Diagnosis Date   ALLERGIC RHINITIS 01/25/2010   Arthritis    "neck, back, shoulders," (02/20/2017)   Chronic lower back pain    COVID 06/17/2021   has lingering cough that subsding   High cholesterol    Hx of cardiovascular stress test    a. Lexiscan Myoview 02/06/13:  EF 59%, no ischemia or scar   Hx of echocardiogram    a.  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   Vitiligo     Current Outpatient Medications  Medication Sig Dispense Refill   acetaminophen (TYLENOL) 500 MG tablet Take 1,000 mg by mouth every 8 (eight) hours as needed for mild pain or moderate pain.     Aloe-Sodium Chloride (  AYR SALINE NASAL GEL NA) Place 1 application into the nose at bedtime as needed (congestion).     amoxicillin (AMOXIL) 500 MG capsule Take 500 mg by mouth 4 (four) times daily.     Ascorbic Acid (VITAMIN C PO) Take 900 mg by mouth 3 (three) times a week.     Blood Glucose Monitoring Suppl (ONETOUCH VERIO) w/Device KIT Use as instructed to check blood sugar daily. 1 kit 0   Cholecalciferol (VITAMIN D3 PO) Take 1 tablet by mouth daily.      cyanocobalamin (VITAMIN B12) 1000 MCG tablet Take 1,000 mcg by mouth daily.     doxazosin (CARDURA) 2 MG tablet TAKE 1 TABLET BY MOUTH EVERYDAY AT BEDTIME 90 tablet 2   ELIQUIS 5 MG TABS tablet TAKE 1 TABLET BY MOUTH TWICE A DAY 180 tablet 1   finasteride (PROSCAR) 5 MG tablet Take 5 mg by mouth daily.     glucose blood (ONETOUCH VERIO) test strip Use as instructed 100 strip 3   Lancets (ONETOUCH ULTRASOFT) lancets Use as instructed to check blood sugar once daily 100 each 3   losartan (COZAAR) 50 MG tablet TAKE 1 TABLET BY MOUTH EVERY DAY 90 tablet 1   metoprolol succinate (TOPROL-XL) 100 MG 24 hr tablet TAKE 1 TABLET (100 MG TOTAL) BY MOUTH AT BEDTIME. TAKE WITH OR IMMEDIATELY FOLLOWING A MEAL. 90 tablet 3   OVER THE COUNTER MEDICATION Take 1 capsule by mouth at bedtime. OTC - Super Beta Prostate     pravastatin (PRAVACHOL) 40 MG tablet TAKE 1 TABLET BY MOUTH EVERYDAY AT BEDTIME 90 tablet 3   PRESCRIPTION MEDICATION Inhale into the lungs at bedtime. CPAP     Semaglutide,0.25 or 0.5MG /DOS, (OZEMPIC, 0.25 OR 0.5 MG/DOSE,) 2 MG/3ML SOPN Inject under skin 0.5 mg weekly 9 mL 3   sildenafil (VIAGRA) 100 MG tablet Take 100 mg by mouth daily as needed for erectile dysfunction.      No current facility-administered medications for this encounter.    ROS- All systems are reviewed and negative except as per the HPI above.  Physical Exam: Vitals:   10/22/23 1431  BP: 126/70  Pulse: 85  Weight: 117.9 kg  Height: 6\' 2"  (1.88 m)     GEN: Well nourished, well developed in no acute distress NECK: No JVD; No carotid bruits CARDIAC: Regular rate and rhythm, no murmurs, rubs, gallops RESPIRATORY:  Clear to auscultation without rales, wheezing or rhonchi  ABDOMEN: Soft, non-tender, non-distended EXTREMITIES:  No edema; No deformity    Wt Readings from Last 3 Encounters:  10/22/23 117.9 kg  09/10/23 116.1 kg  09/03/23 118.9 kg    EKG today demonstrates  SR, 1st degree AV block Vent. rate 85  BPM PR interval 290 ms QRS duration 88 ms QT/QTcB 364/433 ms   Echo 07/31/22 demonstrated   1. Left ventricular ejection fraction, by estimation, is 50 to 55%. The  left ventricle has low normal function. The left ventricle has no regional  wall motion abnormalities. There is moderate asymmetric left ventricular  hypertrophy of the basal-septal segment. Left ventricular diastolic parameters are indeterminate.   2. Right ventricular systolic function is normal. The right ventricular  size is normal.   3. The mitral valve is grossly normal. Trivial mitral valve  regurgitation. No evidence of mitral stenosis.   4. The aortic valve has an indeterminant number of cusps- apperance of  forme fruste aortic valve . There is moderate calcification of the aortic  valve. Aortic valve regurgitation is  not visualized. Aortic valve  sclerosis/calcification is present,  without any evidence of aortic stenosis.   5. The inferior vena cava is normal in size with greater than 50%  respiratory variability, suggesting right atrial pressure of 3 mmHg.   Comparison(s): LV function slightly improved from 2022 TEE (different  modality assessed).    Epic records are reviewed at length today  CHA2DS2-VASc Score = 7  The patient's score is based upon: CHF History: 1 HTN History: 1 Diabetes History: 1 Stroke History: 2 Vascular Disease History: 0 Age Score: 2 Gender Score: 0        ASSESSMENT AND PLAN: Persistent Atrial Fibrillation/atrial flutter The patient's CHA2DS2-VASc score is 7, indicating a 11.2% annual risk of stroke. S/p afib ablation on 03/30/21 and 319/24 Patient appears to be maintaining SR Continue Eliquis 5 mg BID  Continue Toprol 100 mg daily  Secondary Hypercoagulable State (ICD10:  D68.69) The patient is at significant risk for stroke/thromboembolism based upon his CHA2DS2-VASc Score of 7.  Continue Apixaban (Eliquis).   Obesity Body mass index is 33.38 kg/m.  Encouraged  lifestyle modification  OSA  Encouraged nightly CPAP  HTN Stable on current regimen   Follow up with Dr Elberta Fortis in one year.     Jorja Loa PA-C [facility] [address] [place], [place] [address] [phone] 10/22/2023 2:37 PM

## 2023-11-18 ENCOUNTER — Other Ambulatory Visit: Payer: Self-pay | Admitting: Family Medicine

## 2023-11-27 ENCOUNTER — Encounter: Payer: Medicare HMO | Admitting: Family Medicine

## 2023-12-09 ENCOUNTER — Other Ambulatory Visit: Payer: Self-pay | Admitting: Family Medicine

## 2023-12-18 ENCOUNTER — Other Ambulatory Visit: Payer: Self-pay | Admitting: Family Medicine

## 2024-01-03 ENCOUNTER — Ambulatory Visit: Payer: Medicare HMO | Admitting: Internal Medicine

## 2024-01-08 ENCOUNTER — Ambulatory Visit: Payer: Medicare HMO | Admitting: Family Medicine

## 2024-01-08 VITALS — BP 115/70 | HR 86 | Temp 97.7°F | Ht 74.0 in | Wt 256.4 lb

## 2024-01-08 DIAGNOSIS — E7800 Pure hypercholesterolemia, unspecified: Secondary | ICD-10-CM

## 2024-01-08 DIAGNOSIS — Z7985 Long-term (current) use of injectable non-insulin antidiabetic drugs: Secondary | ICD-10-CM

## 2024-01-08 DIAGNOSIS — N401 Enlarged prostate with lower urinary tract symptoms: Secondary | ICD-10-CM

## 2024-01-08 DIAGNOSIS — Z23 Encounter for immunization: Secondary | ICD-10-CM | POA: Diagnosis not present

## 2024-01-08 DIAGNOSIS — I10 Essential (primary) hypertension: Secondary | ICD-10-CM | POA: Diagnosis not present

## 2024-01-08 DIAGNOSIS — I4819 Other persistent atrial fibrillation: Secondary | ICD-10-CM

## 2024-01-08 DIAGNOSIS — G8929 Other chronic pain: Secondary | ICD-10-CM

## 2024-01-08 DIAGNOSIS — M549 Dorsalgia, unspecified: Secondary | ICD-10-CM

## 2024-01-08 DIAGNOSIS — Z0001 Encounter for general adult medical examination with abnormal findings: Secondary | ICD-10-CM

## 2024-01-08 DIAGNOSIS — E538 Deficiency of other specified B group vitamins: Secondary | ICD-10-CM

## 2024-01-08 DIAGNOSIS — E1165 Type 2 diabetes mellitus with hyperglycemia: Secondary | ICD-10-CM

## 2024-01-08 DIAGNOSIS — Z6832 Body mass index (BMI) 32.0-32.9, adult: Secondary | ICD-10-CM

## 2024-01-08 NOTE — Assessment & Plan Note (Signed)
 He is down about 13 pounds since our last visit.  Congratulated him on weight loss.

## 2024-01-08 NOTE — Assessment & Plan Note (Signed)
 Continue management per record allergy.  Anticoagulated on Eliquis and rate controlled on metoprolol.

## 2024-01-08 NOTE — Assessment & Plan Note (Signed)
 Mild flare of recently due to recent travel to [place].  Overall manageable.  He is seeing orthopedics for this.

## 2024-01-08 NOTE — Assessment & Plan Note (Signed)
 Follows with endocrinology.  On Ozempic 0.5 mg weekly.  Check A1c with labs.

## 2024-01-08 NOTE — Patient Instructions (Signed)
 It was very nice to see you today!  We will get your flu shot today.  Please come back for your blood work.  Please continue to work on diet and exercise.  Return in about 1 year (around 01/07/2025) for Annual Physical.   Take care, Dr Jimmey Ralph  PLEASE NOTE:  If you had any lab tests, please let us know if you have not heard back within a few days. You may see your results on mychart before we have a chance to review them but we will give you a call once they are reviewed by [place].   If we ordered any referrals today, please let us know if you have not heard from their office within the next week.   If you had any urgent prescriptions sent in today, please check with the pharmacy within an hour of our visit to make sure the prescription was transmitted appropriately.   Please try these tips to maintain a healthy lifestyle:  Eat at least 3 REAL meals and 1-2 snacks per day.  Aim for no more than 5 hours between eating.  If you eat breakfast, please do so within one hour of getting up.   Each meal should contain half fruits/vegetables, one quarter protein, and one quarter carbs (no bigger than a computer mouse)  Cut down on sweet beverages. This includes juice, soda, and sweet tea.   Drink at least 1 glass of water with each meal and aim for at least 8 glasses per day  Exercise at least 150 minutes every week.    Preventive Care 26 Years and Older, Male Preventive care refers to lifestyle choices and visits with your health care provider that can promote health and wellness. Preventive care visits are also called wellness exams. What can I expect for my preventive care visit? Counseling During your preventive care visit, your health care provider may ask about your: Medical history, including: Past medical problems. Family medical history. History of falls. Current health, including: Emotional well-being. Home life and relationship well-being. Sexual activity. Memory and ability to  understand (cognition). Lifestyle, including: Alcohol, nicotine or tobacco, and drug use. Access to firearms. Diet, exercise, and sleep habits. Work and work Astronomer. Sunscreen use. Safety issues such as seatbelt and bike helmet use. Physical exam Your health care provider will check your: Height and weight. These may be used to calculate your BMI (body mass index). BMI is a measurement that tells if you are at a healthy weight. Waist circumference. This measures the distance around your waistline. This measurement also tells if you are at a healthy weight and may help predict your risk of certain diseases, such as type 2 diabetes and high blood pressure. Heart rate and blood pressure. Body temperature. Skin for abnormal spots. What immunizations do I need?  Vaccines are usually given at various ages, according to a schedule. Your health care provider will recommend vaccines for you based on your age, medical history, and lifestyle or other factors, such as travel or where you work. What tests do I need? Screening Your health care provider may recommend screening tests for certain conditions. This may include: Lipid and cholesterol levels. Diabetes screening. This is done by checking your blood sugar (glucose) after you have not eaten for a while (fasting). Hepatitis C test. Hepatitis B test. HIV (human immunodeficiency virus) test. STI (sexually transmitted infection) testing, if you are at risk. Lung cancer screening. Colorectal cancer screening. Prostate cancer screening. Abdominal aortic aneurysm (AAA) screening. You may need  this if you are a current or former smoker. Talk with your health care provider about your test results, treatment options, and if necessary, the need for more tests. Follow these instructions at home: Eating and drinking  Eat a diet that includes fresh fruits and vegetables, whole grains, lean protein, and low-fat dairy products. Limit your intake of  foods with high amounts of sugar, saturated fats, and salt. Take vitamin and mineral supplements as recommended by your health care provider. Do not drink alcohol if your health care provider tells you not to drink. If you drink alcohol: Limit how much you have to 0-2 drinks a day. Know how much alcohol is in your drink. In the [place] one drink equals one 12 oz bottle of beer (355 mL), one 5 oz glass of wine (148 mL), or one 1 oz glass of hard liquor (44 mL). Lifestyle Brush your teeth every morning and night with fluoride toothpaste. Floss one time each day. Exercise for at least 30 minutes 5 or more days each week. Do not use any products that contain nicotine or tobacco. These products include cigarettes, chewing tobacco, and vaping devices, such as e-cigarettes. If you need help quitting, ask your health care provider. Do not use drugs. If you are sexually active, practice safe sex. Use a condom or other form of protection to prevent STIs. Take aspirin only as told by your health care provider. Make sure that you understand how much to take and what form to take. Work with your health care provider to find out whether it is safe and beneficial for you to take aspirin daily. Ask your health care provider if you need to take a cholesterol-lowering medicine (statin). Find healthy ways to manage stress, such as: Meditation, yoga, or listening to music. Journaling. Talking to a trusted person. Spending time with friends and family. Safety Always wear your seat belt while driving or riding in a vehicle. Do not drive: If you have been drinking alcohol. Do not ride with someone who has been drinking. When you are tired or distracted. While texting. If you have been using any mind-altering substances or drugs. Wear a helmet and other protective equipment during sports activities. If you have firearms in your house, make sure you follow all gun safety procedures. Minimize exposure to UV  radiation to reduce your risk of skin cancer. What's next? Visit your health care provider once a year for an annual wellness visit. Ask your health care provider how often you should have your eyes and teeth checked. Stay up to date on all vaccines. This information is not intended to replace advice given to you by your health care provider. Make sure you discuss any questions you have with your health care provider. Document Revised: 04/20/2021 Document Reviewed: 04/20/2021 Elsevier Patient Education  2024 [employer].

## 2024-01-08 NOTE — Progress Notes (Signed)
 Chief Complaint:  Gordon Phillips is a 78 y.o. male who presents today for his annual comprehensive physical exam.    Assessment/Plan:  Chronic Problems Addressed Today: Essential hypertension At goal today on metoprolol succinate 100 mg daily, and losartan 50 mg daily.  Back pain, chronic Mild flare of recently due to recent travel to [place].  Overall manageable.  He is seeing orthopedics for this.  HLD (hyperlipidemia) On lovastatin 40 mg daily.  He will come back for lipids.  Persistent atrial fibrillation (HCC) Continue management per record allergy.  Anticoagulated on Eliquis and rate controlled on metoprolol.  BPH (benign prostatic hyperplasia) Following with urology.  He is on doxazosin 2 mg daily and finasteride 5 mg daily.  Type 2 diabetes mellitus (HCC) Follows with endocrinology.  On Ozempic 0.5 mg weekly.  Check A1c with labs.  Morbid obesity (HCC) He is down about 13 pounds since our last visit.  Congratulated him on weight loss.   Preventative Healthcare: Flu vaccine given today.  He will come back soon for labs - declined checking today.  No longer needs colonoscopy due to age.  Up-to-date on other vaccines.  Patient Counseling(The following topics were reviewed and/or handout was given):  -Nutrition: Stressed importance of moderation in sodium/caffeine intake, saturated fat and cholesterol, caloric balance, sufficient intake of fresh fruits, vegetables, and fiber.  -Stressed the importance of regular exercise.   -Substance Abuse: Discussed cessation/primary prevention of tobacco, alcohol, or other drug use; driving or other dangerous activities under the influence; availability of treatment for abuse.   -Injury prevention: Discussed safety belts, safety helmets, smoke detector, smoking near bedding or upholstery.   -Sexuality: Discussed sexually transmitted diseases, partner selection, use of condoms, avoidance of unintended pregnancy and contraceptive  alternatives.   -Dental health: Discussed importance of regular tooth brushing, flossing, and dental visits.  -Health maintenance and immunizations reviewed. Please refer to Health maintenance section.  Return to care in 1 year for next preventative visit.     Subjective:  HPI:  He has no acute complaints today. See Assessment / plan for status of chronic conditions.   Lifestyle Diet: Balanced. Plenty of fruits and vegetables.  Exercise: 3 times per week.      01/08/2024    1:55 PM  Depression screen PHQ 2/9  Decreased Interest 0  Down, Depressed, Hopeless 0  PHQ - 2 Score 0    Health Maintenance Due  Topic Date Due   Diabetic kidney evaluation - Urine ACR  Never done   OPHTHALMOLOGY EXAM  09/11/2023   FOOT EXAM  10/03/2023   HEMOGLOBIN A1C  01/03/2024     ROS: Per HPI, otherwise a complete review of systems was negative.   PMH:  The following were reviewed and entered/updated in epic: Past Medical History:  Diagnosis Date   ALLERGIC RHINITIS 01/25/2010   Arthritis    "neck, back, shoulders," (02/20/2017)   Chronic lower back pain    COVID 06/17/2021   has lingering cough that subsding   High cholesterol    Hx of cardiovascular stress test    a. Lexiscan Myoview 02/06/13:  EF 59%, no ischemia or scar   Hx of echocardiogram    a.  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   Vitiligo    Patient Active Problem List   Diagnosis Date Noted   Vitamin D deficiency 10/10/2022   B12 deficiency 10/10/2022   Migraine 12/06/2021   Persistent atrial fibrillation (HCC)  12/28/2020   Hypercoagulable state due to persistent atrial fibrillation (HCC) 12/28/2020   (HFpEF) heart failure with preserved ejection fraction (HCC) 09/08/2020   Stroke of unknown cause (HCC) 09/08/2020   Facial pain 10/09/2019   Dysphagia 09/29/2019   Type 2 diabetes mellitus (HCC) 09/19/2019   H/O Graves' disease 09/19/2019   Deviated septum 08/20/2018   BPH (benign prostatic hyperplasia) 08/20/2018   Atypical atrial flutter (HCC) 08/12/2018   HLD (hyperlipidemia) 02/20/2017   History of CVA (cerebrovascular accident) 02/20/2017   Back pain, chronic 11/22/2015   Morbid obesity (HCC) 05/31/2015   MGUS (monoclonal gammopathy of unknown significance) 01/21/2015   OSA (obstructive sleep apnea) 03/16/2014   Essential hypertension 01/25/2010   Allergic rhinitis 01/25/2010   Past Surgical History:  Procedure Laterality Date   A-FLUTTER ABLATION N/A 04/23/2019   Procedure: A-FLUTTER ABLATION;  Surgeon: Duke Salvia, MD;  Location: [facility] INVASIVE CV LAB;  Service: Cardiovascular;  Laterality: N/A;   ATRIAL FIBRILLATION ABLATION N/A 03/30/2021   Procedure: ATRIAL FIBRILLATION ABLATION;  Surgeon: Regan Lemming, MD;  Location: [facility] INVASIVE CV LAB;  Service: Cardiovascular;  Laterality: N/A;   ATRIAL FIBRILLATION ABLATION N/A 01/23/2023   Procedure: ATRIAL FIBRILLATION ABLATION;  Surgeon: Regan Lemming, MD;  Location: [facility] INVASIVE CV LAB;  Service: Cardiovascular;  Laterality: N/A;   BIOPSY  09/29/2019   Procedure: BIOPSY;  Surgeon: Tressia Danas, MD;  Location: [facility] ENDOSCOPY;  Service: Gastroenterology;;   CARDIOVERSION N/A 08/14/2018   Procedure: CARDIOVERSION;  Surgeon: Jake Bathe, MD;  Location: [facility] ENDOSCOPY;  Service: Cardiovascular;  Laterality: N/A;   CARDIOVERSION N/A 01/04/2021   Procedure: CARDIOVERSION;  Surgeon: Chrystie Nose, MD;  Location: [facility] ENDOSCOPY;  Service: Cardiovascular;  Laterality: N/A;   CATARACT EXTRACTION Left 09/2022    ESOPHAGOGASTRODUODENOSCOPY N/A 09/29/2019   Procedure: ESOPHAGOGASTRODUODENOSCOPY (EGD);  Surgeon: Tressia Danas, MD;  Location: [facility] ENDOSCOPY;  Service: Gastroenterology;  Laterality: N/A;   INGUINAL HERNIA REPAIR Bilateral    LUMBAR LAMINECTOMY     TEE WITHOUT CARDIOVERSION N/A 08/14/2018   Procedure: TRANSESOPHAGEAL ECHOCARDIOGRAM (TEE);  Surgeon: Jake Bathe, MD;  Location: [facility] ENDOSCOPY;  Service: Cardiovascular;  Laterality: N/A;   TEE WITHOUT CARDIOVERSION N/A 03/30/2021   Procedure: TRANSESOPHAGEAL ECHOCARDIOGRAM (TEE);  Surgeon: Chilton Si, MD;  Location: [facility] ENDOSCOPY;  Service: Cardiovascular;  Laterality: N/A;   UMBILICAL HERNIA REPAIR      Family History  Problem Relation Age of Onset   Stroke Father        Passed away in his 39s   Thyroid disease Sister        goiter   Sudden death Brother        ? Drug use, no autopsy   Throat cancer Brother    Colon cancer Neg Hx    Esophageal cancer Neg Hx    Rectal cancer Neg Hx    Stomach cancer Neg Hx     Medications- reviewed and updated Current Outpatient Medications  Medication Sig Dispense Refill   acetaminophen (TYLENOL) 500 MG tablet Take 1,000 mg by mouth every 8 (eight) hours  as needed for mild pain or moderate pain.     Aloe-Sodium Chloride (AYR SALINE NASAL GEL NA) Place 1 application into the nose at bedtime as needed (congestion).     Ascorbic Acid (VITAMIN C PO) Take 900 mg by mouth 3 (three) times a week.     Blood Glucose Monitoring Suppl (ONETOUCH VERIO) w/Device KIT Use as instructed to check blood sugar daily. 1 kit 0   Cholecalciferol (VITAMIN D3 PO) Take 1 tablet by mouth daily.     cyanocobalamin (VITAMIN B12) 1000 MCG tablet Take 1,000 mcg by mouth daily.     doxazosin (CARDURA) 2 MG tablet TAKE 1 TABLET BY MOUTH EVERYDAY AT BEDTIME 90 tablet 2   ELIQUIS 5 MG TABS tablet TAKE 1 TABLET BY MOUTH TWICE A DAY 180 tablet 1   finasteride (PROSCAR) 5 MG tablet Take 5 mg by mouth daily.     glucose  blood (ONETOUCH VERIO) test strip Use as instructed 100 strip 3   Lancets (ONETOUCH ULTRASOFT) lancets Use as instructed to check blood sugar once daily 100 each 3   losartan (COZAAR) 50 MG tablet TAKE 1 TABLET BY MOUTH EVERY DAY 90 tablet 1   metoprolol succinate (TOPROL-XL) 100 MG 24 hr tablet TAKE 1 TABLET (100 MG TOTAL) BY MOUTH AT BEDTIME. TAKE WITH OR IMMEDIATELY FOLLOWING A MEAL. 90 tablet 3   OVER THE COUNTER MEDICATION Take 1 capsule by mouth at bedtime. OTC - Super Beta Prostate     pravastatin (PRAVACHOL) 40 MG tablet TAKE 1 TABLET BY MOUTH EVERYDAY AT BEDTIME 90 tablet 3   PRESCRIPTION MEDICATION Inhale into the lungs at bedtime. CPAP     Semaglutide,0.25 or 0.5MG /DOS, (OZEMPIC, 0.25 OR 0.5 MG/DOSE,) 2 MG/3ML SOPN Inject under skin 0.5 mg weekly 9 mL 3   sildenafil (VIAGRA) 100 MG tablet Take 100 mg by mouth daily as needed for erectile dysfunction.      No current facility-administered medications for this visit.    Allergies-reviewed and updated Allergies  Allergen Reactions   Flomax [Tamsulosin Hcl] Itching   Relpax [Eletriptan] Other (See Comments) and Hypertension    Chest pain and sweating   Other Palpitations    Any steroids cause heart palpitations  (tolerates low doses)    Social History   Socioeconomic History   Marital status: Married    Spouse name: Not on file   Number of children: 2   Years of education: Not on file   Highest education level: Not on file  Occupational History   Occupation: Airline pilot   Occupation: retired  Tobacco Use   Smoking status: Former    Current packs/day: 0.00    Average packs/day: 0.5 packs/day for 8.0 years (4.0 ttl pk-yrs)    Types: Cigarettes    Start date: 38    Quit date: 1968    Years since quitting: 57.2   Smokeless tobacco: Never   Tobacco comments:    Former smoker 08/22/22  Vaping Use   Vaping status: Never Used  Substance and Sexual Activity   Alcohol use: Yes    Alcohol/week: 1.0 - 2.0 standard drink of  alcohol    Types: 1 - 2 Shots of liquor per week    Comment: socially   Drug use: No   Sexual activity: Yes  Other Topics Concern   Not on file  Social History Narrative   Not on file   Social Drivers of Health   Financial Resource Strain: Low Risk  (09/10/2023)   Overall Financial  Resource Strain (CARDIA)    Difficulty of Paying Living Expenses: Not hard at all  Food Insecurity: No Food Insecurity (09/10/2023)   Hunger Vital Sign    Worried About Running Out of Food in the Last Year: Never true    Ran Out of Food in the Last Year: Never true  Transportation Needs: No Transportation Needs (09/10/2023)   PRAPARE - Administrator, Civil Service (Medical): No    Lack of Transportation (Non-Medical): No  Physical Activity: Sufficiently Active (09/10/2023)   Exercise Vital Sign    Days of Exercise per Week: 3 days    Minutes of Exercise per Session: 60 min  Stress: No Stress Concern Present (09/10/2023)   [employer] of Occupational Health - Occupational Stress Questionnaire    Feeling of Stress : Not at all  Social Connections: Moderately Isolated (09/10/2023)   Social Connection and Isolation Panel [NHANES]    Frequency of Communication with Friends and Family: More than three times a week    Frequency of Social Gatherings with Friends and Family: More than three times a week    Attends Religious Services: Never    Database administrator or Organizations: No    Attends Engineer, structural: Never    Marital Status: Married        Objective:  Physical Exam: BP 115/70   Pulse 86   Temp 97.7 F (36.5 C) (Temporal)   Ht 6\' 2"  (1.88 m)   Wt 256 lb 6.4 oz (116.3 kg)   SpO2 97%   BMI 32.92 kg/m   Body mass index is 32.92 kg/m. Wt Readings from Last 3 Encounters:  01/08/24 256 lb 6.4 oz (116.3 kg)  10/22/23 260 lb (117.9 kg)  09/10/23 256 lb (116.1 kg)   Gen: NAD, resting comfortably HEENT: TMs normal bilaterally. OP clear. No thyromegaly noted.   CV: RRR with no murmurs appreciated Pulm: NWOB, CTAB with no crackles, wheezes, or rhonchi GI: Normal bowel sounds present. Soft, Nontender, Nondistended. MSK: no edema, cyanosis, or clubbing noted Skin: warm, dry Neuro: CN2-12 grossly intact. Strength 5/5 in upper and lower extremities. Reflexes symmetric and intact bilaterally.  Psych: Normal affect and thought content     Emanuella Nickle M. Jimmey Ralph, MD 01/08/2024 2:23 PM

## 2024-01-08 NOTE — Assessment & Plan Note (Addendum)
 At goal today on metoprolol succinate 100 mg daily, and losartan 50 mg daily.

## 2024-01-08 NOTE — Assessment & Plan Note (Signed)
 Following with urology.  He is on doxazosin 2 mg daily and finasteride 5 mg daily.

## 2024-01-08 NOTE — Assessment & Plan Note (Signed)
 On lovastatin 40 mg daily.  He will come back for lipids.

## 2024-01-22 ENCOUNTER — Other Ambulatory Visit

## 2024-01-29 ENCOUNTER — Other Ambulatory Visit

## 2024-01-29 ENCOUNTER — Other Ambulatory Visit (INDEPENDENT_AMBULATORY_CARE_PROVIDER_SITE_OTHER)

## 2024-01-29 DIAGNOSIS — I10 Essential (primary) hypertension: Secondary | ICD-10-CM

## 2024-01-29 DIAGNOSIS — Z0001 Encounter for general adult medical examination with abnormal findings: Secondary | ICD-10-CM

## 2024-01-29 DIAGNOSIS — E1165 Type 2 diabetes mellitus with hyperglycemia: Secondary | ICD-10-CM

## 2024-01-29 DIAGNOSIS — E7800 Pure hypercholesterolemia, unspecified: Secondary | ICD-10-CM

## 2024-01-29 LAB — COMPREHENSIVE METABOLIC PANEL
ALT: 11 U/L (ref 0–53)
AST: 17 U/L (ref 0–37)
Albumin: 4.2 g/dL (ref 3.5–5.2)
Alkaline Phosphatase: 53 U/L (ref 39–117)
BUN: 15 mg/dL (ref 6–23)
CO2: 26 meq/L (ref 19–32)
Calcium: 9.5 mg/dL (ref 8.4–10.5)
Chloride: 105 meq/L (ref 96–112)
Creatinine, Ser: 0.93 mg/dL (ref 0.40–1.50)
GFR: 79.65 mL/min (ref >60.00)
Glucose, Bld: 141 mg/dL — ABNORMAL HIGH (ref 70–99)
Potassium: 3.9 meq/L (ref 3.5–5.1)
Sodium: 138 meq/L (ref 135–145)
Total Bilirubin: 0.8 mg/dL (ref 0.2–1.2)
Total Protein: 7.3 g/dL (ref 6.0–8.3)

## 2024-01-29 LAB — CBC
HCT: 40.8 % (ref 39.0–52.0)
Hemoglobin: 13.8 g/dL (ref 13.0–17.0)
MCHC: 33.8 g/dL (ref 30.0–36.0)
MCV: 93.2 fl (ref 78.0–100.0)
Platelets: 144 10*3/uL — ABNORMAL LOW (ref 150.0–400.0)
RBC: 4.38 Mil/uL (ref 4.22–5.81)
RDW: 13.9 % (ref 11.5–15.5)
WBC: 5.6 10*3/uL (ref 4.0–10.5)

## 2024-01-29 LAB — LIPID PANEL
Cholesterol: 152 mg/dL (ref 0–200)
HDL: 47.7 mg/dL (ref >39.00)
LDL Cholesterol: 96 mg/dL (ref 0–99)
NonHDL: 104.68
Total CHOL/HDL Ratio: 3
Triglycerides: 45 mg/dL (ref 0.0–149.0)
VLDL: 9 mg/dL (ref 0.0–40.0)

## 2024-01-29 LAB — HEMOGLOBIN A1C: Hgb A1c MFr Bld: 5.7 % (ref 4.6–6.5)

## 2024-01-29 LAB — MICROALBUMIN / CREATININE URINE RATIO
Creatinine,U: 269.1 mg/dL
Microalb Creat Ratio: 7.4 mg/g (ref 0.0–30.0)
Microalb, Ur: 2 mg/dL — ABNORMAL HIGH (ref 0.0–1.9)

## 2024-01-29 LAB — TSH: TSH: 0.65 u[IU]/mL (ref 0.35–5.50)

## 2024-02-01 ENCOUNTER — Encounter: Payer: Self-pay | Admitting: Family Medicine

## 2024-02-01 NOTE — Progress Notes (Signed)
 His labs are all stable.  Do not need to make any changes to his treatment plan at this time.  He should continue to work on diet and exercise.  We can recheck in a year.

## 2024-02-18 ENCOUNTER — Inpatient Hospital Stay: Payer: Medicare HMO | Attending: Hematology

## 2024-02-18 DIAGNOSIS — D472 Monoclonal gammopathy: Secondary | ICD-10-CM | POA: Insufficient documentation

## 2024-02-18 LAB — CMP (CANCER CENTER ONLY)
ALT: 11 U/L (ref 0–44)
AST: 15 U/L (ref 15–41)
Albumin: 4.2 g/dL (ref 3.5–5.0)
Alkaline Phosphatase: 51 U/L (ref 38–126)
Anion gap: 5 (ref 5–15)
BUN: 16 mg/dL (ref 8–23)
CO2: 26 mmol/L (ref 22–32)
Calcium: 9.3 mg/dL (ref 8.9–10.3)
Chloride: 107 mmol/L (ref 98–111)
Creatinine: 0.94 mg/dL (ref 0.61–1.24)
GFR, Estimated: >60 mL/min (ref >60)
Glucose, Bld: 90 mg/dL (ref 70–99)
Potassium: 4 mmol/L (ref 3.5–5.1)
Sodium: 138 mmol/L (ref 135–145)
Total Bilirubin: 0.9 mg/dL (ref 0.0–1.2)
Total Protein: 7.3 g/dL (ref 6.5–8.1)

## 2024-02-18 LAB — CBC WITH DIFFERENTIAL (CANCER CENTER ONLY)
Abs Immature Granulocytes: 0 10*3/uL (ref 0.00–0.07)
Basophils Absolute: 0 10*3/uL (ref 0.0–0.1)
Basophils Relative: 1 %
Eosinophils Absolute: 0.1 10*3/uL (ref 0.0–0.5)
Eosinophils Relative: 2 %
HCT: 40.8 % (ref 39.0–52.0)
Hemoglobin: 13.8 g/dL (ref 13.0–17.0)
Immature Granulocytes: 0 %
Lymphocytes Relative: 31 %
Lymphs Abs: 1.7 10*3/uL (ref 0.7–4.0)
MCH: 31 pg (ref 26.0–34.0)
MCHC: 33.8 g/dL (ref 30.0–36.0)
MCV: 91.7 fL (ref 80.0–100.0)
Monocytes Absolute: 0.4 10*3/uL (ref 0.1–1.0)
Monocytes Relative: 8 %
Neutro Abs: 3.2 10*3/uL (ref 1.7–7.7)
Neutrophils Relative %: 58 %
Platelet Count: 122 10*3/uL — ABNORMAL LOW (ref 150–400)
RBC: 4.45 MIL/uL (ref 4.22–5.81)
RDW: 12.6 % (ref 11.5–15.5)
WBC Count: 5.6 10*3/uL (ref 4.0–10.5)
nRBC: 0 % (ref 0.0–0.2)

## 2024-02-19 LAB — KAPPA/LAMBDA LIGHT CHAINS
Kappa free light chain: 14.8 mg/L (ref 3.3–19.4)
Kappa, lambda light chain ratio: 1.57 (ref 0.26–1.65)
Lambda free light chains: 9.4 mg/L (ref 5.7–26.3)

## 2024-02-20 LAB — MULTIPLE MYELOMA PANEL, SERUM
Albumin SerPl Elph-Mcnc: 3.6 g/dL (ref 2.9–4.4)
Albumin/Glob SerPl: 1.2 (ref 0.7–1.7)
Alpha 1: 0.2 g/dL (ref 0.0–0.4)
Alpha2 Glob SerPl Elph-Mcnc: 0.9 g/dL (ref 0.4–1.0)
B-Globulin SerPl Elph-Mcnc: 0.9 g/dL (ref 0.7–1.3)
Gamma Glob SerPl Elph-Mcnc: 1.2 g/dL (ref 0.4–1.8)
Globulin, Total: 3.2 g/dL (ref 2.2–3.9)
IgA: 128 mg/dL (ref 61–437)
IgG (Immunoglobin G), Serum: 1281 mg/dL (ref 603–1613)
IgM (Immunoglobulin M), Srm: 83 mg/dL (ref 15–143)
M Protein SerPl Elph-Mcnc: 0.4 g/dL — ABNORMAL HIGH
Total Protein ELP: 6.8 g/dL (ref 6.0–8.5)

## 2024-02-28 ENCOUNTER — Other Ambulatory Visit: Payer: Self-pay | Admitting: Cardiology

## 2024-02-28 DIAGNOSIS — I4819 Other persistent atrial fibrillation: Secondary | ICD-10-CM

## 2024-02-29 NOTE — Telephone Encounter (Signed)
 Eliquis  5mg  refill request received. Patient is 78 years old, weight-116.3kg, Crea-0.94 on 02/18/24, Diagnosis-Afib, and last seen by Valeda Garter on 10/22/23. Dose is appropriate based on dosing criteria. Will send in refill to requested pharmacy.

## 2024-03-03 ENCOUNTER — Encounter: Payer: Self-pay | Admitting: Internal Medicine

## 2024-03-03 ENCOUNTER — Ambulatory Visit: Payer: Medicare HMO | Admitting: Internal Medicine

## 2024-03-03 ENCOUNTER — Telehealth: Payer: Self-pay

## 2024-03-03 DIAGNOSIS — E785 Hyperlipidemia, unspecified: Secondary | ICD-10-CM | POA: Diagnosis not present

## 2024-03-03 DIAGNOSIS — R7303 Prediabetes: Secondary | ICD-10-CM

## 2024-03-03 DIAGNOSIS — Z8639 Personal history of other endocrine, nutritional and metabolic disease: Secondary | ICD-10-CM

## 2024-03-03 DIAGNOSIS — G4700 Insomnia, unspecified: Secondary | ICD-10-CM

## 2024-03-03 MED ORDER — OZEMPIC (0.25 OR 0.5 MG/DOSE) 2 MG/3ML ~~LOC~~ SOPN: PEN_INJECTOR | SUBCUTANEOUS | 3 refills | Status: AC

## 2024-03-03 NOTE — Patient Instructions (Addendum)
 Please continue: - Ozempic  0.5 mg weekly  Try magnesium glycinate and magnesium L-threonate.  Please return in 6 months with your sugar log.

## 2024-03-03 NOTE — Progress Notes (Signed)
 Patient ID: Gordon Phillips, male   DOB: [birth year], 78 y.o.   MRN: [id number]   HPI: Gordon Phillips is a 78 y.o.-year-old male, initially referred by his PCP, Dr. Daneil Dunker, returning for follow-up for prediabetes, dx in ~2013 and history of Graves' disease.  His wife, Gordon Phillips, is also my patient.  Last visit 8 months ago.  Interim history: No increased urination, blurry vision, nausea, chest pain.  He has back pain - bulging disks - he had RFA of his back, then steroid inj's - prev. getting them every 2 months, now as needed. He lost almost 15 more pounds before last visit, previously lost 13.  He gained 5 lbs since last OV. At today's visit, he mentions more insomnia.  He wakes up in the middle of the night and cannot go back to sleep.  This is a problem when he is working, especially as he is driving.  He would not want to retire.  Reviewed history: Patient has a history of impaired glucose tolerance/prediabetes, but he was found to have an HbA1c in the diabetic range at last check in 08/2019.   He tells me that he knows why his HbA1c was higher: he was working part-time, but stopped working at the start of the coronavirus pandemic and started to eat more.   After the HbA1c returned high >> he started to change his diet: reduced bread, stopped sweets, increased fruit and veggies. He also tried to restart exercise but hurt his back so he is not taking it slowly.  After the HbA1c returned higher, he was started on Metformin  500 mg daily by PCP but he could not tolerate it due to GI symptoms >> abdominal pain and diarrhea.   Reviewed HbA1c levels: Lab Results  Component Value Date   HGBA1C 5.7 01/29/2024   HGBA1C 5.6 07/03/2023   HGBA1C 5.7 (A) 01/02/2023   HGBA1C 6.5 09/11/2022   HGBA1C 5.8 (A) 03/27/2022   HGBA1C 6.0 (A) 05/17/2021   HGBA1C 6.0 (A) 11/09/2020   HGBA1C 5.7 (A) 02/02/2020   HGBA1C 6.5 08/26/2019   HGBA1C 6.1 (H) 02/03/2013   HGBA1C 6.2 05/27/2012   He is on: -  Ozempic  0.25 >> 0.5 mg weekly - started 09/2022  He checks sugars 0-1x a day: - am: 80-90s >> 80-100 >> <100 >> 90-100 - 2h after b'fast: n/c - lunch: n/c - 2h after lunch: n/c - dinner: n/c - 2h after dinner: n/c - bedtime: n/c It is unclear at which level he has hypoglycemia awareness.  Glucometer: One Touch verio  Pt's meals are: 2 meals a day now - varies: - Breakfast: Oatmeal + fruit (banana + grapes) 3x a week; eggs + grits + bacon/sausage; tuna sandwich or tuna salad; eggs + sardines + toast; granola bar >> more oatmeal + grapes + banana - Lunch: skips or peanuts or PB crackers; [employer] - Dinner:  Salmon or pork chop + veggies + rice or potatoes - Snacks: stopped icecream, juice + sodas, grapes, cookies  -+ Mild CKD, last BUN/creatinine:  Lab Results  Component Value Date   BUN 16 02/18/2024   BUN 15 01/29/2024   CREATININE 0.94 02/18/2024   CREATININE 0.93 01/29/2024   Lab Results  Component Value Date   MICRALBCREAT 7.4 01/29/2024  On valsartan.  -+ HL; last set of lipids: Lab Results  Component Value Date   CHOL 152 01/29/2024   HDL 47.70 01/29/2024   LDLCALC 96 01/29/2024   LDLDIRECT 139.2 11/21/2012   TRIG 45.0 01/29/2024  CHOLHDL 3 01/29/2024  On pravastatin  40.  - last eye exam was in 12/2023: No DR reportedly; + glaucoma. Had cataract surgeries in 2024.  - no numbness and tingling in his feet.  Last foot exam 10/02/2022.  Pt has no FH of DM.  He has a history of stroke (left basal ganglia infarct) in 01/2013 -previously on Eliquis , now off.  He also has MGUS, vitiligo, h/o pericarditis.  He has a history of Graves' disease, s/p RAI treatment in the 31s.  Review latest TFTs: Lab Results  Component Value Date   TSH 0.65 01/29/2024   TSH 0.83 09/11/2022   TSH 0.71 03/27/2022   TSH 0.83 05/17/2021   TSH 0.773 09/29/2019   He is on Toprol -XL.  Since last visit, he was found to have a low B12.  He feels less tired after he became more  compliant with the vitamin B12 and vitamin D  supplementation.  ROS: + See HPI  Past Medical History:  Diagnosis Date   ALLERGIC RHINITIS 01/25/2010   Arthritis    "neck, back, shoulders," (02/20/2017)   Chronic lower back pain    COVID 06/17/2021   has lingering cough that subsding   High cholesterol    Hx of cardiovascular stress test    a. Lexiscan  Myoview  02/06/13:  EF 59%, no ischemia or scar   Hx of echocardiogram    a.  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   Vitiligo    Past Surgical History:  Procedure Laterality Date   A-FLUTTER ABLATION N/A 04/23/2019   Procedure: A-FLUTTER ABLATION;  Surgeon: Verona Goodwill, MD;  Location: [facility] INVASIVE CV LAB;  Service: Cardiovascular;  Laterality: N/A;   ATRIAL FIBRILLATION ABLATION N/A 03/30/2021   Procedure: ATRIAL FIBRILLATION ABLATION;  Surgeon: Lei Pump, MD;  Location: [facility] INVASIVE CV LAB;  Service: Cardiovascular;  Laterality: N/A;   ATRIAL FIBRILLATION ABLATION N/A 01/23/2023   Procedure: ATRIAL FIBRILLATION ABLATION;  Surgeon: Lei Pump, MD;  Location: [facility] INVASIVE CV LAB;  Service: Cardiovascular;  Laterality: N/A;   BIOPSY  09/29/2019   Procedure: BIOPSY;  Surgeon: Lindle Rhea, MD;  Location: [facility] ENDOSCOPY;  Service: Gastroenterology;;   CARDIOVERSION N/A 08/14/2018   Procedure: CARDIOVERSION;  Surgeon: Hugh Madura, MD;  Location: [facility] ENDOSCOPY;  Service: Cardiovascular;  Laterality: N/A;   CARDIOVERSION N/A 01/04/2021   Procedure: CARDIOVERSION;  Surgeon: Hazle Lites, MD;  Location: [facility]  ENDOSCOPY;  Service: Cardiovascular;  Laterality: N/A;   CATARACT EXTRACTION Left 09/2022   ESOPHAGOGASTRODUODENOSCOPY N/A 09/29/2019   Procedure: ESOPHAGOGASTRODUODENOSCOPY (EGD);  Surgeon: Lindle Rhea, MD;  Location: [facility] ENDOSCOPY;  Service: Gastroenterology;  Laterality: N/A;   INGUINAL HERNIA REPAIR Bilateral    LUMBAR LAMINECTOMY     TEE WITHOUT CARDIOVERSION N/A 08/14/2018   Procedure: TRANSESOPHAGEAL ECHOCARDIOGRAM (TEE);  Surgeon: Hugh Madura, MD;  Location: [facility] ENDOSCOPY;  Service: Cardiovascular;  Laterality: N/A;   TEE WITHOUT CARDIOVERSION N/A 03/30/2021   Procedure: TRANSESOPHAGEAL ECHOCARDIOGRAM (TEE);  Surgeon: Maudine Sos, MD;  Location: [facility] ENDOSCOPY;  Service: Cardiovascular;  Laterality: N/A;   UMBILICAL HERNIA REPAIR     Social History   Socioeconomic History   Marital status: Married    Spouse name: Not on file   Number of children: 2   Years of education: Not on file   Highest education level: Not on file  Occupational History   Occupation: Airline pilot   Occupation: retired  Tobacco Use   Smoking status: Former    Current packs/day: 0.00    Average packs/day: 0.5 packs/day for 8.0 years (4.0 ttl pk-yrs)    Types: Cigarettes    Start date: 67    Quit date: 1968    Years since quitting: 57.3   Smokeless tobacco: Never   Tobacco comments:    Former smoker 08/22/22  Vaping Use   Vaping status: Never Used  Substance and Sexual Activity   Alcohol use: Yes    Alcohol/week: 1.0 - 2.0 standard drink of alcohol    Types: 1 - 2 Shots of liquor per week    Comment: socially   Drug use: No   Sexual activity: Yes  Other Topics Concern   Not on file  Social History Narrative   Not on file   Social Drivers of Health   Financial Resource Strain: Low Risk  (09/10/2023)   Overall Financial Resource Strain (CARDIA)    Difficulty of Paying Living Expenses: Not hard at all  Food Insecurity: No Food Insecurity (09/10/2023)   Hunger Vital Sign    Worried  About Running Out of Food in the Last Year: Never true    Ran Out of Food in the Last Year: Never true  Transportation Needs: No Transportation Needs (09/10/2023)   PRAPARE - Administrator, Civil Service (Medical): No    Lack of Transportation (Non-Medical): No  Physical Activity: Sufficiently Active (09/10/2023)   Exercise Vital Sign    Days of Exercise per Week: 3 days    Minutes of Exercise per Session: 60 min  Stress: No Stress Concern Present (09/10/2023)   [employer] of Occupational Health - Occupational Stress Questionnaire    Feeling of Stress : Not at all  Social Connections: Moderately Isolated (09/10/2023)   Social Connection and Isolation Panel [NHANES]    Frequency of Communication with Friends and Family: More than three times a week    Frequency of Social Gatherings with Friends and Family: More than three times a week    Attends Religious Services: Never    Database administrator or Organizations: No    Attends Banker Meetings: Never    Marital Status: Married  Catering manager Violence: Not At Risk (09/10/2023)   Humiliation, Afraid, Rape, and Kick questionnaire    Fear of Current or Ex-Partner: No    Emotionally Abused: No    Physically Abused: No    Sexually Abused: No   Current Outpatient Medications on File Prior to Visit  Medication Sig Dispense Refill   acetaminophen  (TYLENOL ) 500 MG tablet Take 1,000 mg by mouth every 8 (eight) hours as needed for mild pain or moderate pain.     Aloe-Sodium Chloride  (AYR SALINE NASAL GEL NA) Place 1 application into the nose at bedtime as needed (congestion).     Ascorbic Acid (VITAMIN C PO) Take 900 mg by mouth 3 (three) times a week.     Blood Glucose Monitoring Suppl (ONETOUCH VERIO) w/Device KIT Use as instructed to check blood sugar daily. 1 kit 0   Cholecalciferol (VITAMIN D3 PO)  Take 1 tablet by mouth daily.     cyanocobalamin  (VITAMIN B12) 1000 MCG tablet Take 1,000 mcg by mouth daily.      doxazosin  (CARDURA ) 2 MG tablet TAKE 1 TABLET BY MOUTH EVERYDAY AT BEDTIME 90 tablet 2   ELIQUIS  5 MG TABS tablet TAKE 1 TABLET BY MOUTH TWICE A DAY 180 tablet 1   finasteride (PROSCAR) 5 MG tablet Take 5 mg by mouth daily.     glucose blood (ONETOUCH VERIO) test strip Use as instructed 100 strip 3   Lancets (ONETOUCH ULTRASOFT) lancets Use as instructed to check blood sugar once daily 100 each 3   losartan  (COZAAR ) 50 MG tablet TAKE 1 TABLET BY MOUTH EVERY DAY 90 tablet 1   metoprolol  succinate (TOPROL -XL) 100 MG 24 hr tablet TAKE 1 TABLET (100 MG TOTAL) BY MOUTH AT BEDTIME. TAKE WITH OR IMMEDIATELY FOLLOWING A MEAL. 90 tablet 3   OVER THE COUNTER MEDICATION Take 1 capsule by mouth at bedtime. OTC - Super Beta Prostate     pravastatin  (PRAVACHOL ) 40 MG tablet TAKE 1 TABLET BY MOUTH EVERYDAY AT BEDTIME 90 tablet 3   PRESCRIPTION MEDICATION Inhale into the lungs at bedtime. CPAP     Semaglutide ,0.25 or 0.5MG /DOS, (OZEMPIC , 0.25 OR 0.5 MG/DOSE,) 2 MG/3ML SOPN Inject under skin 0.5 mg weekly 9 mL 3   sildenafil  (VIAGRA ) 100 MG tablet Take 100 mg by mouth daily as needed for erectile dysfunction.      No current facility-administered medications on file prior to visit.   Allergies  Allergen Reactions   Flomax  [Tamsulosin  Hcl] Itching   Relpax [Eletriptan] Other (See Comments) and Hypertension    Chest pain and sweating   Other Palpitations    Any steroids cause heart palpitations  (tolerates low doses)   Family History  Problem Relation Age of Onset   Stroke Father        Passed away in his 25s   Thyroid  disease Sister        goiter   Sudden death Brother        ? Drug use, no autopsy   Throat cancer Brother    Colon cancer Neg Hx    Esophageal cancer Neg Hx    Rectal cancer Neg Hx    Stomach cancer Neg Hx    PE: BP 124/80   Pulse 86   Ht 6\' 2"  (1.88 m)   Wt 261 lb (118.4 kg)   SpO2 99%   BMI 33.51 kg/m  Wt Readings from Last 3 Encounters:  03/03/24 261 lb (118.4 kg)   01/08/24 256 lb 6.4 oz (116.3 kg)  10/22/23 260 lb (117.9 kg)   Constitutional: overweight, in NAD Eyes: EOMI, no exophthalmos ENT: no thyromegaly, no cervical lymphadenopathy Cardiovascular: RRR, No MRG Respiratory: CTA B Musculoskeletal: no deformities, Skin: + vitiligo rash over arms and neck, legs Neurological: no tremor with outstretched hands Diabetic Foot Exam - Simple   Simple Foot Form Diabetic Foot exam was performed with the following findings: Yes 03/03/2024  3:00 PM  Visual Inspection No deformities, no ulcerations, no other skin breakdown bilaterally: Yes Sensation Testing Intact to touch and monofilament testing bilaterally: Yes Pulse Check Posterior Tibialis and Dorsalis pulse intact bilaterally: Yes Comments Long, thick, toenails    ASSESSMENT: 1. DM2, non-insulin-dependent  2.  History of Graves' disease  3.  Hyperlipidemia  4.  Insomnia  PLAN:  1. Patient with history of well-controlled diabetes, developed likely due to being sedentary and getting steroid injections due to  back pain.  HbA1c at last visit, however, was lower, at 5.6%.  He had another HbA1c obtained last month and this was approximately the same, at 5.7%.  We have him on a regimen of weekly GLP-1 receptor agonist only.  Before last visit, he lost 15 pounds, previously lost another 13.  We did not change his regimen at last visit as sugars were all at goal. -At today's visit, sugars are mostly at goal whenever he checks in the morning that he is doing so sporadically.  He is also not checking blood sugars later in the day, despite advised to do so.  I again advised him to try to check at least every other day at different times of the day, but based on his HbA1c, we will not need to change his regimen today.  I refilled his Ozempic . - I suggested to:  Patient Instructions  Please continue: - Ozempic  0.5 mg weekly  Please return in 6 months with your sugar log.   - advised to check sugars at  different times of the day - 1x a day, rotating check times - advised for yearly eye exams >> he is UTD - return to clinic in 6 months  2. H/o Graves' disease -No signs or symptoms of thyrotoxicosis -Status post RAI treatment in the 80s -Latest TSH was normal last month: Lab Results  Component Value Date   TSH 0.65 01/29/2024  - Will continue to keep an eye on his TFTs.  No intervention needed for now.  3.  Hyperlipidemia - Latest lipid fractions at goal: Lab Results  Component Value Date   CHOL 152 01/29/2024   HDL 47.70 01/29/2024   LDLCALC 96 01/29/2024   LDLDIRECT 139.2 11/21/2012   TRIG 45.0 01/29/2024   CHOLHDL 3 01/29/2024  -He continues to have a statin 40 mg daily.  4.  Insomnia - He cannot take any medication for this due to the need to be alert when driving - I recommended magnesium glycinate or L-threonate  Malijah Lietz, MD PhD [facility] Endocrinology

## 2024-03-03 NOTE — Telephone Encounter (Addendum)
 Called patient to relay message below as per Dr. Maryalice Smaller, patient voiced full understanding and had no further questions at this time.    ----- Message from Sonja Divernon sent at 02/21/2024  6:51 AM EDT ----- PLEASE let pt know his lab results, overall stable, no concerns, thx  Sonja May

## 2024-05-21 ENCOUNTER — Ambulatory Visit: Payer: Self-pay

## 2024-05-21 NOTE — Telephone Encounter (Signed)
 Patient scheduled 05/27/2024 for acute visit with PCP

## 2024-05-21 NOTE — Telephone Encounter (Signed)
 FYI Only or Action Required?: FYI only for provider.  Patient was last seen in primary care on 01/08/2024 by Kennyth Worth HERO, MD.  Called Nurse Triage reporting Insomnia.  Symptoms began several months ago.  Interventions attempted: Prescription medications: lorazepam.  Symptoms are: gradually worsening.  Triage Disposition: See PCP When Office is Open (Within 3 Days)  Patient/caregiver understands and will follow disposition?:   Copied from CRM [id number]. Topic: Clinical - Medical Advice >> May 21, 2024  3:13 PM Frederich PARAS wrote: Reason for CRM: pt calling to get a message to pcp he is not getting any sleep, its getting to be a issue, he has taken lorazepam in the past . he is wondering if he can get a prescription for it. Reason for Disposition  MODERATE - SEVERE insomnia (e.g., awake most of night; lack of sleep interferes with ability to function during the day; interferes with work or school)  Answer Assessment - Initial Assessment Questions States that he takes one half or one quarter of his wife's 1mg  lorazepam and it helps him a little  1. DESCRIPTION: Tell me about your sleeping problem. (e.g., waking frequently during night, sleeping during day and awake at night, trouble falling asleep) How bad is it?      Has difficulty falling back asleep after he wakes up to use the bathroom at night and that sometimes he just wakes up.  He rarely has difficulty falling asleep at the beginning of the night.  Patient sleeping 3-4 hours per night 2. ONSET: How long have you been having trouble sleeping? (e.g., days, weeks, months; longstanding sleep problems)     Worsened over the past year  4. STRESSORS: Is there anything that is making you feel stressed? Is there something that worries you?     Admits to stressful lifestyle 5. PAIN: Do you have any pain that is keeping you awake? (e.g., back pain, joint pain) If Yes, ask How bad is the pain? (e.g., scale 0-10; mild, moderate,  severe).     Patient has back issues, but he does not think this is contributing to his sleeplessness 6. CAFFEINE: Do you drink caffeinated beverages? If Yes, ask How much each day? (e.g., coffee, tea, colas)     Limiting caffeine in sweet tea in the evening 7. ALCOHOL USE OR SUBSTANCE USE: Do you drink alcohol or use any substances?     denies 8. MEDICINE CHANGE: Has there been any recent change in medicines? (e.g., new medicine started, stopped, or dose changed).      denies 9. TREATMENT: What have you done so far to treat this sleep problem? (e.g., prescription or OTC sleep medicines, herbal or dietary supplements, cannabis, relaxation strategies)     States that he takes one half or one quarter of his wife's 1mg  lorazepam and it helps him a little, relaxation strategies 10. OTHER SYMPTOMS: Do you have any other symptoms?  (e.g., difficulty breathing)       Heart racing at times, but possibly because of recent steroid shot  Protocols used: Insomnia-A-AH

## 2024-05-27 ENCOUNTER — Ambulatory Visit (INDEPENDENT_AMBULATORY_CARE_PROVIDER_SITE_OTHER): Admitting: Family Medicine

## 2024-05-27 ENCOUNTER — Encounter: Payer: Self-pay | Admitting: Family Medicine

## 2024-05-27 VITALS — BP 106/67 | HR 78 | Temp 97.3°F | Ht 74.0 in | Wt 257.0 lb

## 2024-05-27 DIAGNOSIS — G4700 Insomnia, unspecified: Secondary | ICD-10-CM | POA: Diagnosis not present

## 2024-05-27 DIAGNOSIS — I10 Essential (primary) hypertension: Secondary | ICD-10-CM

## 2024-05-27 MED ORDER — TRAZODONE HCL 50 MG PO TABS: 25.0000 mg | ORAL_TABLET | Freq: Every evening | ORAL | 3 refills | Status: DC | PRN

## 2024-05-27 NOTE — Patient Instructions (Signed)
 It was very nice to see you today!  Please try the trazodone  to help you with sleep.  See me message in a couple weeks to let us  know how this is working for you.  Return if symptoms worsen or fail to improve.   Take care, Dr Kennyth  PLEASE NOTE:  If you had any lab tests, please let us  know if you have not heard back within a few days. You may see your results on mychart before we have a chance to review them but we will give you a call once they are reviewed by us .   If we ordered any referrals today, please let us  know if you have not heard from their office within the next week.   If you had any urgent prescriptions sent in today, please check with the pharmacy within an hour of our visit to make sure the prescription was transmitted appropriately.   Please try these tips to maintain a healthy lifestyle:  Eat at least 3 REAL meals and 1-2 snacks per day.  Aim for no more than 5 hours between eating.  If you eat breakfast, please do so within one hour of getting up.   Each meal should contain half fruits/vegetables, one quarter protein, and one quarter carbs (no bigger than a computer mouse)  Cut down on sweet beverages. This includes juice, soda, and sweet tea.   Drink at least 1 glass of water with each meal and aim for at least 8 glasses per day  Exercise at least 150 minutes every week.

## 2024-05-27 NOTE — Assessment & Plan Note (Signed)
 At goal today on metoprolol  succinate 100 mg daily and losartan  50 mg daily.

## 2024-05-27 NOTE — Assessment & Plan Note (Signed)
 Lengthy discussion with patient today regarding treatment for his insomnia.  We did discuss sleep hygiene measures and he is already working on this.  He is having sleep maintenance insomnia.  Typically falls asleep without any difficulty.  We did discuss treatment options.  Recommended against benzodiazepine at this use due to concern for dependency and potential side effects.  We did discuss other treatment options as well.  Will start trazodone  50 mg daily.  We discussed potential side effects.  He will follow-up with us  in a week or 2 via MyChart and then we can adjust the dose of medication as tolerated.  If he does not do well with this would consider gabapentin , doxepin, or seroquel.

## 2024-05-27 NOTE — Progress Notes (Signed)
   Gordon Phillips is a 78 y.o. male who presents today for an office visit.  Assessment/Plan:  Chronic Problems Addressed Today: Insomnia Lengthy discussion with patient today regarding treatment for his insomnia.  We did discuss sleep hygiene measures and he is already working on this.  He is having sleep maintenance insomnia.  Typically falls asleep without any difficulty.  We did discuss treatment options.  Recommended against benzodiazepine at this use due to concern for dependency and potential side effects.  We did discuss other treatment options as well.  Will start trazodone  50 mg daily.  We discussed potential side effects.  He will follow-up with us  in a week or 2 via MyChart and then we can adjust the dose of medication as tolerated.  If he does not do well with this would consider gabapentin , doxepin, or seroquel.   Essential hypertension At goal today on metoprolol  succinate 100 mg daily and losartan  50 mg daily.     Subjective:  HPI:  Patient here today to discuss insomnia. This has been going on for several months to years but seems to be getting worse. He is having trouble staying asleep. Has been getting 3-4 hours of sleep nightly. He does wake up at night to urinate.  He has tried OTC sleep aides in the past which have not been effective. He has tried lorazepam in the past which has helped.        Objective:  Physical Exam: BP 106/67   Pulse 78   Temp (!) 97.3 F (36.3 C) (Temporal)   Ht 6' 2 (1.88 m)   Wt 257 lb (116.6 kg)   SpO2 96%   BMI 33.00 kg/m   Gen: No acute distress, resting comfortably Neuro: Grossly normal, moves all extremities Psych: Normal affect and thought content      Laree Garron M. Kennyth, MD 05/27/2024 2:36 PM

## 2024-06-20 ENCOUNTER — Other Ambulatory Visit: Payer: Self-pay | Admitting: Family Medicine

## 2024-07-02 NOTE — Telephone Encounter (Unsigned)
 Copied from CRM [id number]. Topic: Clinical - Medication Refill >> Jul 02, 2024  4:48 PM Harlene ORN wrote: Medication: sildenafil  (VIAGRA ) 100 MG tablet  Has the patient contacted their pharmacy? No (Agent: If no, request that the patient contact the pharmacy for the refill. If patient does not wish to contact the pharmacy document the reason why and proceed with request.) (Agent: If yes, when and what did the pharmacy advise?)  This is the patient's preferred pharmacy:  [facility] [phone] [address], [place] - [address] [place] [address] Phone: [phone] Fax: [phone]  Is this the correct pharmacy for this prescription? Yes If no, delete pharmacy and type the correct one.   Has the prescription been filled recently? No  Is the patient out of the medication? Yes  Has the patient been seen for an appointment in the last year OR does the patient have an upcoming appointment? Yes  Can we respond through MyChart? Yes  Agent: Please be advised that Rx refills may take up to 3 business days. We ask that you follow-up with your pharmacy.

## 2024-07-05 ENCOUNTER — Other Ambulatory Visit: Payer: Self-pay | Admitting: Family Medicine

## 2024-07-12 ENCOUNTER — Other Ambulatory Visit: Payer: Self-pay | Admitting: Cardiology

## 2024-07-12 ENCOUNTER — Other Ambulatory Visit: Payer: Self-pay | Admitting: Family Medicine

## 2024-07-12 DIAGNOSIS — I4819 Other persistent atrial fibrillation: Secondary | ICD-10-CM

## 2024-07-14 NOTE — Telephone Encounter (Signed)
 Prescription refill request for Eliquis  received. Indication:afib Last office visit:12/24 Scr:0.94  4/25 Age: 78 Weight:116.6  kg  Prescription refilled

## 2024-07-30 ENCOUNTER — Telehealth: Payer: Self-pay

## 2024-07-30 NOTE — Telephone Encounter (Signed)
   Patient Name: Gordon Phillips  DOB: 02-01-1946 MRN: [id number]  Primary Cardiologist: Shelda Bruckner, MD  Chart reviewed as part of pre-operative protocol coverage.   IF SIMPLE EXTRACTION/CLEANINGS: Simple dental extractions (i.e. 1-2 teeth) are considered low risk procedures per guidelines and generally do not require any specific cardiac clearance. It is also generally accepted that for simple extractions and dental cleanings, there is no need to interrupt blood thinner therapy.  SBE prophylaxis is not required for the patient from a cardiac standpoint.  I will route this recommendation to the requesting party via Epic fax function and remove from pre-op  pool.  Please call with questions.  Lamarr Satterfield, NP 07/30/2024, 4:12 PM

## 2024-07-30 NOTE — Telephone Encounter (Signed)
   Pre-operative Risk Assessment    Patient Name: Gordon Phillips  DOB: 1946-07-28 MRN: [id number]   Date of last office visit: 04/30/23 WILL CAMNITZ, MD Date of next office visit: NONE  Request for Surgical Clearance    Procedure:  Dental Extraction - Amount of Teeth to be Pulled:  2 TEETH (#13,32) AND POSSIBLE REMOVAL OF TOOTH #10 WITH SITE PRESERVATION  Date of Surgery:  Clearance TBD                                Surgeon:  NOT INDICATED Surgeon's Group or Practice Name:  [facility] Phone number:  [phone] Fax number:  [phone]   Type of Clearance Requested:   - Medical  - Pharmacy:  Hold Apixaban  (Eliquis )     Type of Anesthesia:  Local  VS INTRAVENOUS SEDATION   Additional requests/questions:    Signed, Lucie DELENA Ku   07/30/2024, 3:46 PM

## 2024-08-13 ENCOUNTER — Other Ambulatory Visit: Payer: Self-pay

## 2024-08-13 ENCOUNTER — Emergency Department (HOSPITAL_COMMUNITY)
Admission: EM | Admit: 2024-08-13 | Discharge: 2024-08-13 | Discharge status: Against Medical Advice | Attending: Emergency Medicine | Admitting: Emergency Medicine

## 2024-08-13 ENCOUNTER — Encounter (HOSPITAL_COMMUNITY): Payer: Self-pay

## 2024-08-13 DIAGNOSIS — M545 Low back pain, unspecified: Secondary | ICD-10-CM | POA: Insufficient documentation

## 2024-08-13 DIAGNOSIS — Z5321 Procedure and treatment not carried out due to patient leaving prior to being seen by health care provider: Secondary | ICD-10-CM | POA: Insufficient documentation

## 2024-08-13 NOTE — ED Provider Triage Note (Signed)
 Emergency Medicine Provider Triage Evaluation Note  Raynald Rouillard , a 78 y.o. male  was evaluated in triage.  Pt complains of back pain.  He has history of chronic back pain.  He has been working around the house and states his back started acting up.  He states this happened in the past when he is constipated but took laxative and cleaned himself out without improvement. Good strength in bilateral lower extremities. No midline tenderness or step-offs.  Review of Systems  Positive: As above Negative: As above  Physical Exam  BP (!) 148/78   Pulse 85   Temp (!) 96.6 F (35.9 C)   Resp 18   Ht 6' 2 (1.88 m)   Wt 115.7 kg   SpO2 97%   BMI 32.74 kg/m  Gen:   Awake, no distress   Resp:  Normal effort  MSK:   Moves extremities without difficulty  Other:    Medical Decision Making  Medically screening exam initiated at 12:29 PM.  Appropriate orders placed.  Aayden Cefalu was informed that the remainder of the evaluation will be completed by another provider, this initial triage assessment does not replace that evaluation, and the importance of remaining in the ED until their evaluation is complete.     Hildegard Loge, PA-C 08/13/24 1230

## 2024-08-13 NOTE — ED Triage Notes (Addendum)
 PT c/o lower left back pain for 3 days. Reports he was doing some work around the house and later that day started having pain, nothing has helped the pain, pain currently 10/10

## 2024-08-13 NOTE — ED Notes (Signed)
 Patient decided to leave, and said he will try again tomorrow.

## 2024-08-14 ENCOUNTER — Telehealth: Payer: Self-pay | Admitting: Hematology

## 2024-08-14 NOTE — Telephone Encounter (Signed)
 Pt called in to reschedule his lab appointment.

## 2024-08-15 ENCOUNTER — Telehealth: Payer: Self-pay | Admitting: Internal Medicine

## 2024-08-15 MED ORDER — ONETOUCH VERIO REFLECT W/DEVICE KIT: PACK | 0 refills | Status: AC

## 2024-08-15 NOTE — Telephone Encounter (Signed)
 Pt has been notified and voices understanding.

## 2024-08-15 NOTE — Telephone Encounter (Signed)
 Patient's wife called and stated that the patient needs a new glucose meter.Please advise

## 2024-08-18 ENCOUNTER — Inpatient Hospital Stay

## 2024-08-18 ENCOUNTER — Telehealth: Payer: Self-pay | Admitting: Hematology

## 2024-08-18 NOTE — Telephone Encounter (Signed)
 Gordon Phillips called in to reschedule his lab only appointment.

## 2024-08-19 ENCOUNTER — Inpatient Hospital Stay: Attending: Hematology

## 2024-08-19 ENCOUNTER — Inpatient Hospital Stay: Payer: Medicare HMO

## 2024-08-19 DIAGNOSIS — G8929 Other chronic pain: Secondary | ICD-10-CM | POA: Insufficient documentation

## 2024-08-19 DIAGNOSIS — D472 Monoclonal gammopathy: Secondary | ICD-10-CM | POA: Insufficient documentation

## 2024-08-19 DIAGNOSIS — M549 Dorsalgia, unspecified: Secondary | ICD-10-CM | POA: Insufficient documentation

## 2024-08-19 LAB — CBC WITH DIFFERENTIAL (CANCER CENTER ONLY)
Abs Immature Granulocytes: 0.01 K/uL (ref 0.00–0.07)
Basophils Absolute: 0 K/uL (ref 0.0–0.1)
Basophils Relative: 0 %
Eosinophils Absolute: 0.1 K/uL (ref 0.0–0.5)
Eosinophils Relative: 2 %
HCT: 39.3 % (ref 39.0–52.0)
Hemoglobin: 13.4 g/dL (ref 13.0–17.0)
Immature Granulocytes: 0 %
Lymphocytes Relative: 37 %
Lymphs Abs: 2 K/uL (ref 0.7–4.0)
MCH: 31 pg (ref 26.0–34.0)
MCHC: 34.1 g/dL (ref 30.0–36.0)
MCV: 91 fL (ref 80.0–100.0)
Monocytes Absolute: 0.4 K/uL (ref 0.1–1.0)
Monocytes Relative: 8 %
Neutro Abs: 2.9 K/uL (ref 1.7–7.7)
Neutrophils Relative %: 53 %
Platelet Count: 123 K/uL — ABNORMAL LOW (ref 150–400)
RBC: 4.32 MIL/uL (ref 4.22–5.81)
RDW: 12.7 % (ref 11.5–15.5)
WBC Count: 5.5 K/uL (ref 4.0–10.5)
nRBC: 0 % (ref 0.0–0.2)

## 2024-08-19 LAB — CMP (CANCER CENTER ONLY)
ALT: 11 U/L (ref 0–44)
AST: 15 U/L (ref 15–41)
Albumin: 4.3 g/dL (ref 3.5–5.0)
Alkaline Phosphatase: 46 U/L (ref 38–126)
Anion gap: 4 — ABNORMAL LOW (ref 5–15)
BUN: 13 mg/dL (ref 8–23)
CO2: 31 mmol/L (ref 22–32)
Calcium: 10 mg/dL (ref 8.9–10.3)
Chloride: 105 mmol/L (ref 98–111)
Creatinine: 0.97 mg/dL (ref 0.61–1.24)
GFR, Estimated: >60 mL/min (ref >60)
Glucose, Bld: 82 mg/dL (ref 70–99)
Potassium: 3.9 mmol/L (ref 3.5–5.1)
Sodium: 140 mmol/L (ref 135–145)
Total Bilirubin: 0.7 mg/dL (ref 0.0–1.2)
Total Protein: 7.5 g/dL (ref 6.5–8.1)

## 2024-08-20 LAB — KAPPA/LAMBDA LIGHT CHAINS
Kappa free light chain: 12.6 mg/L (ref 3.3–19.4)
Kappa, lambda light chain ratio: 1.4 (ref 0.26–1.65)
Lambda free light chains: 9 mg/L (ref 5.7–26.3)

## 2024-08-25 LAB — MULTIPLE MYELOMA PANEL, SERUM
Albumin SerPl Elph-Mcnc: 3.6 g/dL (ref 2.9–4.4)
Albumin/Glob SerPl: 1.1 (ref 0.7–1.7)
Alpha 1: 0.2 g/dL (ref 0.0–0.4)
Alpha2 Glob SerPl Elph-Mcnc: 1.1 g/dL — ABNORMAL HIGH (ref 0.4–1.0)
B-Globulin SerPl Elph-Mcnc: 1.1 g/dL (ref 0.7–1.3)
Gamma Glob SerPl Elph-Mcnc: 1.1 g/dL (ref 0.4–1.8)
Globulin, Total: 3.5 g/dL (ref 2.2–3.9)
IgA: 122 mg/dL (ref 61–437)
IgG (Immunoglobin G), Serum: 1273 mg/dL (ref 603–1613)
IgM (Immunoglobulin M), Srm: 82 mg/dL (ref 15–143)
M Protein SerPl Elph-Mcnc: 0.4 g/dL — ABNORMAL HIGH
Total Protein ELP: 7.1 g/dL (ref 6.0–8.5)

## 2024-08-26 ENCOUNTER — Inpatient Hospital Stay: Payer: Medicare HMO | Admitting: Hematology

## 2024-08-26 ENCOUNTER — Ambulatory Visit: Admitting: Internal Medicine

## 2024-08-26 VITALS — BP 116/66 | HR 85 | Temp 98.0°F | Resp 17 | Ht 74.0 in | Wt 261.2 lb

## 2024-08-26 DIAGNOSIS — D472 Monoclonal gammopathy: Secondary | ICD-10-CM

## 2024-08-26 NOTE — Assessment & Plan Note (Signed)
His initial SPEP revealed a low-level of monoclonal paraprotein (0.27g/dl), immunoglobulin level were normal, no anemia, renal dysfunction, or hypercalcemia -His previous CT scan revealed a small hyper lytic lesion in the C5 vertebral body however was not obvious on the MRI scan. -A bone survey showed subtle lucencies in multiple bones, indeterminate.  -Bone marrow biopsy showed 5% plasma cells, suggestive of plasma cell dyscrasia. Cytogenetics was normal. -M protein and other MM panel have remained stable; currently on q6 month lab and annual follow-up observation

## 2024-08-26 NOTE — Progress Notes (Signed)
 [name]   [phone] [phone]   Clinic Follow up Note   Patient Care Team: Kennyth Worth HERO, MD as PCP - General (Family Medicine) Lonni Slain, MD as PCP - Cardiology (Cardiology) Inocencio Soyla Lunger, MD as PCP - Electrophysiology (Cardiology) Lanny Callander, MD as Consulting Physician (Hematology) [facility] as Consulting Physician (Ophthalmology)  Date of Service:  08/26/2024  CHIEF COMPLAINT: f/u of MGUS  CURRENT THERAPY:  Surveillance  Oncology History   MGUS (monoclonal gammopathy of unknown significance) His initial SPEP revealed a low-level of monoclonal paraprotein (0.27g/dl), immunoglobulin level were normal, no anemia, renal dysfunction, or hypercalcemia -His previous CT scan revealed a small hyper lytic lesion in the C5 vertebral body however was not obvious on the MRI scan. -A bone survey showed subtle lucencies in multiple bones, indeterminate.  -Bone marrow biopsy showed 5% plasma cells, suggestive of plasma cell dyscrasia. Cytogenetics was normal. -M protein and other MM panel have remained stable; currently on q6 month lab and annual follow-up observation  Assessment & Plan Monoclonal gammopathy of undetermined significance (MGUS) MGUS is well-managed with M protein level around 0.4-0.5, unchanged over the past three years. It is IgG type with normal IgG and light chain levels, indicating very low risk of progression to multiple myeloma. Kidney and liver function tests are normal, and blood counts are normal except for a slightly lower platelet count, which is not concerning and unrelated to MGUS. The risk of progression to multiple myeloma is much lower than the average 1% due to normal light chain levels and stable M protein levels. - Perform lab tests annually to monitor MGUS. - Schedule office visits biennially unless new symptoms arise. - Instruct to report new pain, anemia, or kidney problems  that could relate to MGUS.  Chronic back pain - Follow-up with orthopedics Dr. Cesario   Plan - Lab reviewed, M protein stable at 0.4, light chain levels are normal, low risk for evolving to multiple myeloma. - Will repeat lab every year, I will see him in 2 years.   Discussed the use of AI scribe software for clinical note transcription with the patient, who gave verbal consent to proceed.  History of Present Illness Gordon Phillips is a 78 year old male with MGUS who presents for follow-up.  M protein level remains stable at 0.4 to 0.5 for the past three years. IgG and light chain levels are normal. Recent labs show normal kidney and liver function with a slightly lower platelet count.  Chronic back pain persists, primarily in the back, with a recent acute exacerbation after physical activity. He managed the pain with rest at home. No new pain, numbness, or inability to walk.     All other systems were reviewed with the patient and are negative.  MEDICAL HISTORY:  Past Medical History:  Diagnosis Date   ALLERGIC RHINITIS 01/25/2010   Arthritis    neck, back, shoulders, (02/20/2017)   Chronic lower back pain    COVID 06/17/2021   has lingering cough that subsding   High cholesterol    Hx of cardiovascular stress test    a. Lexiscan  Myoview  02/06/13:  EF 59%, no ischemia or scar   Hx of echocardiogram    a.  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   Vitiligo     SURGICAL HISTORY: Past Surgical History:  Procedure Laterality Date   A-FLUTTER ABLATION N/A 04/23/2019    Procedure: A-FLUTTER ABLATION;  Surgeon: Fernande Elspeth BROCKS, MD;  Location: [facility] INVASIVE CV LAB;  Service: Cardiovascular;  Laterality: N/A;   ATRIAL FIBRILLATION ABLATION N/A 03/30/2021   Procedure: ATRIAL FIBRILLATION ABLATION;  Surgeon: Inocencio Soyla Lunger, MD;  Location: [facility] INVASIVE CV LAB;  Service: Cardiovascular;  Laterality: N/A;   ATRIAL FIBRILLATION ABLATION N/A 01/23/2023   Procedure: ATRIAL FIBRILLATION ABLATION;  Surgeon: Inocencio Soyla Lunger, MD;  Location: [facility] INVASIVE CV LAB;  Service: Cardiovascular;  Laterality: N/A;   BIOPSY  09/29/2019   Procedure: BIOPSY;  Surgeon: Eda Iha, MD;  Location: [facility] ENDOSCOPY;  Service: Gastroenterology;;   CARDIOVERSION N/A 08/14/2018   Procedure: CARDIOVERSION;  Surgeon: Jeffrie Oneil BROCKS, MD;  Location: [facility] ENDOSCOPY;  Service: Cardiovascular;  Laterality: N/A;   CARDIOVERSION N/A 01/04/2021   Procedure: CARDIOVERSION;  Surgeon: Mona Vinie BROCKS, MD;  Location: [facility] ENDOSCOPY;  Service: Cardiovascular;  Laterality: N/A;   CATARACT EXTRACTION Left 09/2022   ESOPHAGOGASTRODUODENOSCOPY N/A 09/29/2019   Procedure: ESOPHAGOGASTRODUODENOSCOPY (EGD);  Surgeon: Eda Iha, MD;  Location: [facility] ENDOSCOPY;  Service: Gastroenterology;  Laterality: N/A;   INGUINAL HERNIA REPAIR Bilateral    LUMBAR LAMINECTOMY     TEE WITHOUT CARDIOVERSION N/A 08/14/2018   Procedure: TRANSESOPHAGEAL ECHOCARDIOGRAM (TEE);  Surgeon: Jeffrie Oneil BROCKS, MD;  Location: [facility] ENDOSCOPY;  Service: Cardiovascular;  Laterality: N/A;   TEE WITHOUT CARDIOVERSION N/A 03/30/2021   Procedure: TRANSESOPHAGEAL ECHOCARDIOGRAM (TEE);  Surgeon: Raford Riggs, MD;  Location: [facility] ENDOSCOPY;  Service: Cardiovascular;  Laterality: N/A;   UMBILICAL HERNIA REPAIR      I have reviewed the social history and family history with the patient and they are unchanged from previous note.  ALLERGIES:  is allergic to flomax  [tamsulosin  hcl], relpax [eletriptan], and other.  MEDICATIONS:  Current  Outpatient Medications  Medication Sig Dispense Refill   acetaminophen  (TYLENOL ) 500 MG tablet Take 1,000 mg by mouth every 8 (eight) hours as needed for mild pain or moderate pain.     Aloe-Sodium Chloride  (AYR SALINE NASAL GEL NA) Place 1 application into the nose at bedtime as needed (congestion).     Ascorbic Acid (VITAMIN C PO) Take 900 mg by mouth 3 (three) times a week.     Blood Glucose Monitoring Suppl (ONETOUCH VERIO REFLECT) w/Device KIT Use to check blood sugar 3 times a day 1 kit 0   Cholecalciferol (VITAMIN D3 PO) Take 1 tablet by mouth daily.     cyanocobalamin  (VITAMIN B12) 1000 MCG tablet Take 1,000 mcg by mouth daily.     doxazosin  (CARDURA ) 2 MG tablet TAKE 1 TABLET BY MOUTH EVERYDAY AT BEDTIME 90 tablet 2   ELIQUIS  5 MG TABS tablet TAKE 1 TABLET BY MOUTH TWICE A DAY 180 tablet 1   finasteride (PROSCAR) 5 MG tablet Take 5 mg by mouth daily.     glucose blood (ONETOUCH VERIO) test strip Use as instructed 100 strip 3   Lancets (ONETOUCH ULTRASOFT) lancets Use as instructed to check blood sugar once daily 100 each 3   losartan  (COZAAR ) 50 MG tablet TAKE 1 TABLET BY MOUTH EVERY DAY 90 tablet 1  metoprolol  succinate (TOPROL -XL) 100 MG 24 hr tablet TAKE 1 TABLET (100 MG TOTAL) BY MOUTH AT BEDTIME. TAKE WITH OR IMMEDIATELY FOLLOWING A MEAL. 90 tablet 3   OVER THE COUNTER MEDICATION Take 1 capsule by mouth at bedtime. OTC - Super Beta Prostate     pravastatin  (PRAVACHOL ) 40 MG tablet TAKE 1 TABLET BY MOUTH EVERYDAY AT BEDTIME 90 tablet 3   PRESCRIPTION MEDICATION Inhale into the lungs at bedtime. CPAP     Semaglutide ,0.25 or 0.5MG /DOS, (OZEMPIC , 0.25 OR 0.5 MG/DOSE,) 2 MG/3ML SOPN Inject under skin 0.5 mg weekly 9 mL 3   sildenafil  (VIAGRA ) 100 MG tablet Take 100 mg by mouth daily as needed for erectile dysfunction.      traZODone  (DESYREL ) 50 MG tablet TAKE 0.5-1 TABLETS BY MOUTH AT BEDTIME AS NEEDED FOR SLEEP. 90 tablet 2   No current facility-administered medications for this  visit.    PHYSICAL EXAMINATION: ECOG PERFORMANCE STATUS: 1 - Symptomatic but completely ambulatory  Vitals:   08/26/24 1425  BP: 116/66  Pulse: 85  Resp: 17  Temp: 98 F (36.7 C)  SpO2: 96%   Wt Readings from Last 3 Encounters:  08/26/24 261 lb 3.2 oz (118.5 kg)  08/13/24 255 lb (115.7 kg)  05/27/24 257 lb (116.6 kg)     GENERAL:alert, no distress and comfortable SKIN: skin color, texture, turgor are normal, no rashes or significant lesions EYES: normal, Conjunctiva are pink and non-injected, sclera clear Musculoskeletal:no cyanosis of digits and no clubbing  NEURO: alert & oriented x 3 with fluent speech, no focal motor/sensory deficits  Physical Exam   LABORATORY DATA:  I have reviewed the data as listed    Latest Ref Rng & Units 08/19/2024    2:22 PM 02/18/2024    2:29 PM 01/29/2024   12:00 PM  CBC  WBC 4.0 - 10.5 K/uL 5.5  5.6  5.6   Hemoglobin 13.0 - 17.0 g/dL 86.5  86.1  86.1   Hematocrit 39.0 - 52.0 % 39.3  40.8  40.8   Platelets 150 - 400 K/uL 123  122  144.0         Latest Ref Rng & Units 08/19/2024    2:22 PM 02/18/2024    2:29 PM 01/29/2024   12:00 PM  CMP  Glucose 70 - 99 mg/dL 82  90  858   BUN 8 - 23 mg/dL 13  16  15    Creatinine 0.61 - 1.24 mg/dL 9.02  9.05  9.06   Sodium 135 - 145 mmol/L 140  138  138   Potassium 3.5 - 5.1 mmol/L 3.9  4.0  3.9   Chloride 98 - 111 mmol/L 105  107  105   CO2 22 - 32 mmol/L 31  26  26    Calcium  8.9 - 10.3 mg/dL 89.9  9.3  9.5   Total Protein 6.5 - 8.1 g/dL 7.5  7.3  7.3   Total Bilirubin 0.0 - 1.2 mg/dL 0.7  0.9  0.8   Alkaline Phos 38 - 126 U/L 46  51  53   AST 15 - 41 U/L 15  15  17    ALT 0 - 44 U/L 11  11  11        RADIOGRAPHIC STUDIES: I have personally reviewed the radiological images as listed and agreed with the findings in the report. No results found.    No orders of the defined types were placed in this encounter.  All questions were answered. The patient knows to  call the clinic with any  problems, questions or concerns. No barriers to learning was detected. The total time spent in the appointment was 20 minutes, including review of chart and various tests results, discussions about plan of care and coordination of care plan     Onita Mattock, MD 08/26/2024

## 2024-09-02 ENCOUNTER — Encounter: Payer: Self-pay | Admitting: Internal Medicine

## 2024-09-02 ENCOUNTER — Ambulatory Visit: Admitting: Internal Medicine

## 2024-09-02 VITALS — BP 118/64 | HR 83 | Ht 74.0 in | Wt 259.2 lb

## 2024-09-02 DIAGNOSIS — Z8639 Personal history of other endocrine, nutritional and metabolic disease: Secondary | ICD-10-CM

## 2024-09-02 DIAGNOSIS — Z7985 Long-term (current) use of injectable non-insulin antidiabetic drugs: Secondary | ICD-10-CM

## 2024-09-02 DIAGNOSIS — E1165 Type 2 diabetes mellitus with hyperglycemia: Secondary | ICD-10-CM

## 2024-09-02 DIAGNOSIS — E7849 Other hyperlipidemia: Secondary | ICD-10-CM

## 2024-09-02 LAB — POCT GLYCOSYLATED HEMOGLOBIN (HGB A1C): Hemoglobin A1C: 5.6 % (ref 4.0–5.6)

## 2024-09-02 NOTE — Patient Instructions (Addendum)
 Please continue: - Ozempic  0.5 mg weekly  Please return in 5 months with your sugar log.

## 2024-09-02 NOTE — Progress Notes (Signed)
 Patient ID: Gordon Phillips, male   DOB: 03-10-1946, 78 y.o.   MRN: [id number]   HPI: Gordon Phillips is a 78 y.o.-year-old male, initially referred by his PCP, Dr. Kennyth, returning for follow-up for DM2, dx in ~2013 and history of Graves' disease.  His wife, Diane Spath, is also my patient.  Last visit 7 months ago.  Interim history: No increased urination, blurry vision, nausea, chest pain.  He has back pain - bulging disks - he had RFA of his back, then steroid inj's - prev. getting them every 2 months, now as needed. He had 3 extractions 3 weeks ago. He has insomnia >> recently started Trazodone  >> helping. He is still working 3x a week. He cut down on sweet tea and juice. He lost 7 pounds since last visit.  Reviewed history: Patient has a history of impaired glucose tolerance/prediabetes, but he was found to have an HbA1c in the diabetic range at last check in 08/2019.   He tells me that he knows why his HbA1c was higher: he was working part-time, but stopped working at the start of the coronavirus pandemic and started to eat more.   After the HbA1c returned high >> he started to change his diet: reduced bread, stopped sweets, increased fruit and veggies. He also tried to restart exercise but hurt his back so he is not taking it slowly.  After the HbA1c returned higher, he was started on Metformin  500 mg daily by PCP but he could not tolerate it due to GI symptoms >> abdominal pain and diarrhea.   Reviewed HbA1c levels: Lab Results  Component Value Date   HGBA1C 5.7 01/29/2024   HGBA1C 5.6 07/03/2023   HGBA1C 5.7 (A) 01/02/2023   HGBA1C 6.5 09/11/2022   HGBA1C 5.8 (A) 03/27/2022   HGBA1C 6.0 (A) 05/17/2021   HGBA1C 6.0 (A) 11/09/2020   HGBA1C 5.7 (A) 02/02/2020   HGBA1C 6.5 08/26/2019   HGBA1C 6.1 (H) 02/03/2013   HGBA1C 6.2 05/27/2012   He is on: - Ozempic  0.25 >> 0.5 mg weekly - started 09/2022  He is not checking sugars now. From before: - am: 80-90s >> 80-100 >> <100 >>  90-100 >> n/c - 2h after b'fast: n/c - lunch: n/c - 2h after lunch: n/c - dinner: n/c - 2h after dinner: n/c - bedtime: n/c It is unclear at which level he has hypoglycemia awareness.  Glucometer: One Touch verio  Pt's meals are: 2 meals a day now - varies: - Breakfast: Oatmeal + fruit (banana + grapes) 3x a week; eggs + grits + bacon/sausage; tuna sandwich or tuna salad; eggs + sardines + toast; granola bar >> more oatmeal + grapes + banana - Lunch: skips or peanuts or PB crackers; [employer] - Dinner:  Salmon or pork chop + veggies + rice or potatoes - Snacks: stopped icecream, , grapes, cookies  -+ Mild CKD, last BUN/creatinine:  Lab Results  Component Value Date   BUN 13 08/19/2024   BUN 16 02/18/2024   CREATININE 0.97 08/19/2024   CREATININE 0.94 02/18/2024   Lab Results  Component Value Date   MICRALBCREAT 7.4 01/29/2024  On valsartan.  -+ HL; last set of lipids: Lab Results  Component Value Date   CHOL 152 01/29/2024   HDL 47.70 01/29/2024   LDLCALC 96 01/29/2024   LDLDIRECT 139.2 11/21/2012   TRIG 45.0 01/29/2024   CHOLHDL 3 01/29/2024  On pravastatin  40.  - last eye exam was in 12/2023: No DR reportedly; + glaucoma. Had cataract  surgeries in 2024.  - no numbness and tingling in his feet.  Last foot exam 03/03/2024.  Pt has no FH of DM.  He has a history of stroke (left basal ganglia infarct) in 01/2013 -previously on Eliquis , now off.  He also has MGUS, vitiligo, h/o pericarditis.  He has a history of Graves' disease, s/p RAI treatment in the 21s.  Review latest TFTs: Lab Results  Component Value Date   TSH 0.65 01/29/2024   TSH 0.83 09/11/2022   TSH 0.71 03/27/2022   TSH 0.83 05/17/2021   TSH 0.773 09/29/2019   He is on Toprol -XL.  Since last visit, he was found to have a low B12.  He feels less tired after he became more compliant with the vitamin B12 and vitamin D  supplementation.  ROS: + See HPI  Past Medical History:  Diagnosis Date    ALLERGIC RHINITIS 01/25/2010   Arthritis    neck, back, shoulders, (02/20/2017)   Chronic lower back pain    COVID 06/17/2021   has lingering cough that subsding   High cholesterol    Hx of cardiovascular stress test    a. Lexiscan  Myoview  02/06/13:  EF 59%, no ischemia or scar   Hx of echocardiogram    a.  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   Vitiligo    Past Surgical History:  Procedure Laterality Date   A-FLUTTER ABLATION N/A 04/23/2019   Procedure: A-FLUTTER ABLATION;  Surgeon: Fernande Elspeth BROCKS, MD;  Location: [facility] INVASIVE CV LAB;  Service: Cardiovascular;  Laterality: N/A;   ATRIAL FIBRILLATION ABLATION N/A 03/30/2021   Procedure: ATRIAL FIBRILLATION ABLATION;  Surgeon: Inocencio Soyla Lunger, MD;  Location: [facility] INVASIVE CV LAB;  Service: Cardiovascular;  Laterality: N/A;   ATRIAL FIBRILLATION ABLATION N/A 01/23/2023   Procedure: ATRIAL FIBRILLATION ABLATION;  Surgeon: Inocencio Soyla Lunger, MD;  Location: [facility] INVASIVE CV LAB;  Service: Cardiovascular;  Laterality: N/A;   BIOPSY  09/29/2019   Procedure: BIOPSY;  Surgeon: Eda Iha, MD;  Location: [facility] ENDOSCOPY;  Service: Gastroenterology;;   CARDIOVERSION N/A 08/14/2018   Procedure: CARDIOVERSION;  Surgeon: Jeffrie Oneil BROCKS, MD;  Location: [facility] ENDOSCOPY;  Service: Cardiovascular;  Laterality: N/A;   CARDIOVERSION N/A 01/04/2021   Procedure: CARDIOVERSION;  Surgeon: Mona Vinie BROCKS, MD;  Location: [facility] ENDOSCOPY;  Service: Cardiovascular;  Laterality: N/A;   CATARACT EXTRACTION Left 09/2022   ESOPHAGOGASTRODUODENOSCOPY N/A  09/29/2019   Procedure: ESOPHAGOGASTRODUODENOSCOPY (EGD);  Surgeon: Eda Iha, MD;  Location: [facility] ENDOSCOPY;  Service: Gastroenterology;  Laterality: N/A;   INGUINAL HERNIA REPAIR Bilateral    LUMBAR LAMINECTOMY     TEE WITHOUT CARDIOVERSION N/A 08/14/2018   Procedure: TRANSESOPHAGEAL ECHOCARDIOGRAM (TEE);  Surgeon: Jeffrie Oneil BROCKS, MD;  Location: [facility] ENDOSCOPY;  Service: Cardiovascular;  Laterality: N/A;   TEE WITHOUT CARDIOVERSION N/A 03/30/2021   Procedure: TRANSESOPHAGEAL ECHOCARDIOGRAM (TEE);  Surgeon: Raford Riggs, MD;  Location: [facility] ENDOSCOPY;  Service: Cardiovascular;  Laterality: N/A;   UMBILICAL HERNIA REPAIR     Social History   Socioeconomic History  Marital status: Married    Spouse name: Not on file   Number of children: 2   Years of education: Not on file   Highest education level: Not on file  Occupational History   Occupation: airline pilot   Occupation: retired  Tobacco Use   Smoking status: Former    Current packs/day: 0.00    Average packs/day: 0.5 packs/day for 8.0 years (4.0 ttl pk-yrs)    Types: Cigarettes    Start date: 54    Quit date: 1968    Years since quitting: 57.8   Smokeless tobacco: Never   Tobacco comments:    Former smoker 08/22/22  Vaping Use   Vaping status: Never Used  Substance and Sexual Activity   Alcohol use: Yes    Alcohol/week: 1.0 - 2.0 standard drink of alcohol    Types: 1 - 2 Shots of liquor per week    Comment: socially   Drug use: No   Sexual activity: Yes  Other Topics Concern   Not on file  Social History Narrative   Not on file   Social Drivers of Health   Financial Resource Strain: Low Risk  (09/10/2023)   Overall Financial Resource Strain (CARDIA)    Difficulty of Paying Living Expenses: Not hard at all  Food Insecurity: No Food Insecurity (08/26/2024)   Hunger Vital Sign    Worried About Running Out of Food in the Last Year: Never true    Ran Out of Food in the Last Year: Never true  Transportation Needs:  No Transportation Needs (08/26/2024)   PRAPARE - Administrator, Civil Service (Medical): No    Lack of Transportation (Non-Medical): No  Physical Activity: Sufficiently Active (09/10/2023)   Exercise Vital Sign    Days of Exercise per Week: 3 days    Minutes of Exercise per Session: 60 min  Stress: No Stress Concern Present (09/10/2023)   [employer] of Occupational Health - Occupational Stress Questionnaire    Feeling of Stress : Not at all  Social Connections: Moderately Isolated (09/10/2023)   Social Connection and Isolation Panel    Frequency of Communication with Friends and Family: More than three times a week    Frequency of Social Gatherings with Friends and Family: More than three times a week    Attends Religious Services: Never    Database Administrator or Organizations: No    Attends Banker Meetings: Never    Marital Status: Married  Catering Manager Violence: Not At Risk (08/26/2024)   Humiliation, Afraid, Rape, and Kick questionnaire    Fear of Current or Ex-Partner: No    Emotionally Abused: No    Physically Abused: No    Sexually Abused: No   Current Outpatient Medications on File Prior to Visit  Medication Sig Dispense Refill   acetaminophen  (TYLENOL ) 500 MG tablet Take 1,000 mg by mouth every 8 (eight) hours as needed for mild pain or moderate pain.     Aloe-Sodium Chloride  (AYR SALINE NASAL GEL NA) Place 1 application into the nose at bedtime as needed (congestion).     Ascorbic Acid (VITAMIN C PO) Take 900 mg by mouth 3 (three) times a week.     Blood Glucose Monitoring Suppl (ONETOUCH VERIO REFLECT) w/Device KIT Use to check blood sugar 3 times a day 1 kit 0   Cholecalciferol (VITAMIN D3 PO) Take 1 tablet by mouth daily.     cyanocobalamin  (VITAMIN B12) 1000 MCG tablet Take 1,000 mcg by mouth  daily.     doxazosin  (CARDURA ) 2 MG tablet TAKE 1 TABLET BY MOUTH EVERYDAY AT BEDTIME 90 tablet 2   ELIQUIS  5 MG TABS tablet TAKE 1 TABLET BY  MOUTH TWICE A DAY 180 tablet 1   finasteride (PROSCAR) 5 MG tablet Take 5 mg by mouth daily.     glucose blood (ONETOUCH VERIO) test strip Use as instructed 100 strip 3   Lancets (ONETOUCH ULTRASOFT) lancets Use as instructed to check blood sugar once daily 100 each 3   losartan  (COZAAR ) 50 MG tablet TAKE 1 TABLET BY MOUTH EVERY DAY 90 tablet 1   metoprolol  succinate (TOPROL -XL) 100 MG 24 hr tablet TAKE 1 TABLET (100 MG TOTAL) BY MOUTH AT BEDTIME. TAKE WITH OR IMMEDIATELY FOLLOWING A MEAL. 90 tablet 3   OVER THE COUNTER MEDICATION Take 1 capsule by mouth at bedtime. OTC - Super Beta Prostate     pravastatin  (PRAVACHOL ) 40 MG tablet TAKE 1 TABLET BY MOUTH EVERYDAY AT BEDTIME 90 tablet 3   PRESCRIPTION MEDICATION Inhale into the lungs at bedtime. CPAP     Semaglutide ,0.25 or 0.5MG /DOS, (OZEMPIC , 0.25 OR 0.5 MG/DOSE,) 2 MG/3ML SOPN Inject under skin 0.5 mg weekly 9 mL 3   sildenafil  (VIAGRA ) 100 MG tablet Take 100 mg by mouth daily as needed for erectile dysfunction.      traZODone  (DESYREL ) 50 MG tablet TAKE 0.5-1 TABLETS BY MOUTH AT BEDTIME AS NEEDED FOR SLEEP. 90 tablet 2   No current facility-administered medications on file prior to visit.   Allergies  Allergen Reactions   Flomax  [Tamsulosin  Hcl] Itching   Relpax [Eletriptan] Other (See Comments) and Hypertension    Chest pain and sweating   Other Palpitations    Any steroids cause heart palpitations  (tolerates low doses)   Family History  Problem Relation Age of Onset   Stroke Father        Passed away in his 18s   Thyroid  disease Sister        goiter   Sudden death Brother        ? Drug use, no autopsy   Throat cancer Brother    Colon cancer Neg Hx    Esophageal cancer Neg Hx    Rectal cancer Neg Hx    Stomach cancer Neg Hx    PE: BP 118/64   Pulse 83   Ht 6' 2 (1.88 m)   Wt 259 lb 3.2 oz (117.6 kg)   SpO2 97%   BMI 33.28 kg/m  Wt Readings from Last 15 Encounters:  09/02/24 259 lb 3.2 oz (117.6 kg)  08/26/24 261  lb 3.2 oz (118.5 kg)  08/13/24 255 lb (115.7 kg)  05/27/24 257 lb (116.6 kg)  03/03/24 261 lb (118.4 kg)  01/08/24 256 lb 6.4 oz (116.3 kg)  10/22/23 260 lb (117.9 kg)  09/10/23 256 lb (116.1 kg)  09/03/23 262 lb 1.6 oz (118.9 kg)  08/13/23 258 lb 2 oz (117.1 kg)  07/03/23 256 lb 6.4 oz (116.3 kg)  04/30/23 242 lb 12.8 oz (110.1 kg)  02/20/23 266 lb 12.8 oz (121 kg)  01/23/23 265 lb (120.2 kg)  01/16/23 269 lb (122 kg)   Constitutional: overweight, in NAD Eyes: EOMI, no exophthalmos ENT: no thyromegaly, no cervical lymphadenopathy Cardiovascular: RRR, No MRG Respiratory: CTA B Musculoskeletal: no deformities, Skin: + vitiligo rash over arms and neck, legs Neurological: no tremor with outstretched hands  ASSESSMENT: 1. DM2, non-insulin-dependent  2.  History of Graves' disease  3.  Hyperlipidemia  4.  Insomnia  PLAN:  1. Patient with history of well-controlled diabetes, does not likely due to being sedentary and getting steroid injections due to back pain.  HbA1c is maintained in an excellent range for him, with the latest being 5.7% in 01/2024.  Sugars were mostly at goal whenever he was checking in the morning but he was not checking consistently.  I advised him to try to check at least every other day at different times of the day but, based on the HbA1c, we did not change his regimen.  I refilled his Ozempic  at last visit. - At today's visit, he is not checking blood sugars.  He just picked up glucometer from visit.  Judging by the excellent HbA1c, we will need to change his regimen for now. - I suggested to:  Patient Instructions  Please continue: - Ozempic  0.5 mg weekly  Please return in 5 months with your sugar log.   - we checked his HbA1c: 5.6% (excellent) - advised to check sugars at different times of the day - 1x a day, rotating check times - advised for yearly eye exams >> he is UTD - return to clinic in 6 months  2. H/o Graves' disease - No signs or  symptoms of thyrotoxicosis - He had RAI treatment in the 80s - Latest TSH was normal: Lab Results  Component Value Date   TSH 0.65 01/29/2024  - Will continue to keep an eye on his TFTs.  No intervention needed for now. - Plan to recheck the TFTs at next visit  3.  Hyperlipidemia - Lipid fractions were at goal: Lab Results  Component Value Date   CHOL 152 01/29/2024   HDL 47.70 01/29/2024   LDLCALC 96 01/29/2024   LDLDIRECT 139.2 11/21/2012   TRIG 45.0 01/29/2024   CHOLHDL 3 01/29/2024  - He continues pravastatin  40 mg daily - no side effects  Lela Fendt, MD PhD [facility] Endocrinology

## 2024-09-02 NOTE — Addendum Note (Signed)
 Addended by: CLEOTILDE ROLIN RAMAN on: 09/02/2024 03:42 PM   Modules accepted: Orders

## 2024-09-03 ENCOUNTER — Ambulatory Visit: Payer: Medicare HMO

## 2024-09-03 VITALS — Ht 74.0 in | Wt 254.0 lb

## 2024-09-03 DIAGNOSIS — Z0000 Encounter for general adult medical examination without abnormal findings: Secondary | ICD-10-CM

## 2024-09-03 NOTE — Progress Notes (Signed)
 Subjective:   Gordon Phillips is a 78 y.o. who presents for a Medicare Wellness preventive visit.  As a reminder, Annual Wellness Visits don't include a physical exam, and some assessments may be limited, especially if this visit is performed virtually. We may recommend an in-person follow-up visit with your provider if needed.  Visit Complete: Virtual I connected with  Gordon Phillips on 09/03/24 by a video and audio enabled telemedicine application and verified that I am speaking with the correct person using two identifiers.  Patient Location: Home  Provider Location: Home Office  I discussed the limitations of evaluation and management by telemedicine. The patient expressed understanding and agreed to proceed.  Vital Signs: Because this visit was a virtual/telehealth visit, some criteria may be missing or patient reported. Any vitals not documented were not able to be obtained and vitals that have been documented are patient reported.    Persons Participating in Visit: Patient.  AWV Questionnaire: No: Patient Medicare AWV questionnaire was not completed prior to this visit.  Cardiac Risk Factors include: advanced age (>28men, >16 women);dyslipidemia;male gender;hypertension;obesity (BMI >30kg/m2);diabetes mellitus     Objective:    Today's Vitals   09/03/24 1415  Weight: 254 lb (115.2 kg)  Height: 6' 2 (1.88 m)  PainSc: 7    Body mass index is 32.61 kg/m.     09/03/2024    2:21 PM 08/26/2024    2:45 PM 09/10/2023    1:40 PM 01/23/2023   12:12 PM 08/21/2022    1:09 PM 08/08/2021   12:00 PM 01/04/2021    7:32 AM  Advanced Directives  Does Patient Have a Medical Advance Directive? No No No Yes Yes No No  Type of Aeronautical Engineer of [place];Living will Healthcare Power of [place];Living will    Copy of Healthcare Power of Attorney in Chart?     No - copy requested    Would patient like information on creating a medical advance directive? No - Patient  declined No - Patient declined No - Patient declined   Yes (MAU/Ambulatory/Procedural Areas - Information given) No - Patient declined    Current Medications (verified) Outpatient Encounter Medications as of 09/03/2024  Medication Sig   acetaminophen  (TYLENOL ) 500 MG tablet Take 1,000 mg by mouth every 8 (eight) hours as needed for mild pain or moderate pain.   Aloe-Sodium Chloride  (AYR SALINE NASAL GEL NA) Place 1 application into the nose at bedtime as needed (congestion).   amoxicillin  (AMOXIL ) 500 MG capsule Take 500 mg by mouth 3 (three) times daily.   Ascorbic Acid (VITAMIN C PO) Take 900 mg by mouth 3 (three) times a week.   Blood Glucose Monitoring Suppl (ONETOUCH VERIO REFLECT) w/Device KIT Use to check blood sugar 3 times a day   Cholecalciferol (VITAMIN D3 PO) Take 1 tablet by mouth daily.   cyanocobalamin  (VITAMIN B12) 1000 MCG tablet Take 1,000 mcg by mouth daily.   doxazosin  (CARDURA ) 2 MG tablet TAKE 1 TABLET BY MOUTH EVERYDAY AT BEDTIME   ELIQUIS  5 MG TABS tablet TAKE 1 TABLET BY MOUTH TWICE A DAY   finasteride (PROSCAR) 5 MG tablet Take 5 mg by mouth daily.   glucose blood (ONETOUCH VERIO) test strip Use as instructed   Lancets (ONETOUCH ULTRASOFT) lancets Use as instructed to check blood sugar once daily   losartan  (COZAAR ) 50 MG tablet TAKE 1 TABLET BY MOUTH EVERY DAY   metoprolol  succinate (TOPROL -XL) 100 MG 24 hr tablet TAKE 1 TABLET (100  MG TOTAL) BY MOUTH AT BEDTIME. TAKE WITH OR IMMEDIATELY FOLLOWING A MEAL.   OVER THE COUNTER MEDICATION Take 1 capsule by mouth at bedtime. OTC - Super Beta Prostate   pravastatin  (PRAVACHOL ) 40 MG tablet TAKE 1 TABLET BY MOUTH EVERYDAY AT BEDTIME   PRESCRIPTION MEDICATION Inhale into the lungs at bedtime. CPAP   Semaglutide ,0.25 or 0.5MG /DOS, (OZEMPIC , 0.25 OR 0.5 MG/DOSE,) 2 MG/3ML SOPN Inject under skin 0.5 mg weekly   sildenafil  (VIAGRA ) 100 MG tablet Take 100 mg by mouth daily as needed for erectile dysfunction.    traZODone   (DESYREL ) 50 MG tablet TAKE 0.5-1 TABLETS BY MOUTH AT BEDTIME AS NEEDED FOR SLEEP.   No facility-administered encounter medications on file as of 09/03/2024.    Allergies (verified) Flomax  [tamsulosin  hcl], Relpax [eletriptan], and Other   History: Past Medical History:  Diagnosis Date   ALLERGIC RHINITIS 01/25/2010   Arthritis    neck, back, shoulders, (02/20/2017)   Chronic lower back pain    COVID 06/17/2021   has lingering cough that subsding   High cholesterol    Hx of cardiovascular stress test    a. Lexiscan  Myoview  02/06/13:  EF 59%, no ischemia or scar   Hx of echocardiogram    a.  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   Vitiligo    Past Surgical History:  Procedure Laterality Date   A-FLUTTER ABLATION N/A 04/23/2019   Procedure: A-FLUTTER ABLATION;  Surgeon: Fernande Elspeth BROCKS, MD;  Location: [facility] INVASIVE CV LAB;  Service: Cardiovascular;  Laterality: N/A;   ATRIAL FIBRILLATION ABLATION N/A 03/30/2021   Procedure: ATRIAL FIBRILLATION ABLATION;  Surgeon: Inocencio Soyla Lunger, MD;  Location: [facility] INVASIVE CV LAB;  Service: Cardiovascular;  Laterality: N/A;   ATRIAL FIBRILLATION ABLATION N/A 01/23/2023   Procedure: ATRIAL FIBRILLATION ABLATION;  Surgeon: Inocencio Soyla Lunger, MD;  Location: [facility] INVASIVE CV LAB;  Service: Cardiovascular;  Laterality: N/A;   BIOPSY  09/29/2019   Procedure: BIOPSY;  Surgeon: Eda Iha, MD;  Location: [facility] ENDOSCOPY;  Service: Gastroenterology;;   CARDIOVERSION N/A 08/14/2018   Procedure: CARDIOVERSION;  Surgeon: Jeffrie Oneil BROCKS, MD;   Location: [facility] ENDOSCOPY;  Service: Cardiovascular;  Laterality: N/A;   CARDIOVERSION N/A 01/04/2021   Procedure: CARDIOVERSION;  Surgeon: Mona Vinie BROCKS, MD;  Location: [facility] ENDOSCOPY;  Service: Cardiovascular;  Laterality: N/A;   CATARACT EXTRACTION Left 09/2022   ESOPHAGOGASTRODUODENOSCOPY N/A 09/29/2019   Procedure: ESOPHAGOGASTRODUODENOSCOPY (EGD);  Surgeon: Eda Iha, MD;  Location: [facility] ENDOSCOPY;  Service: Gastroenterology;  Laterality: N/A;   INGUINAL HERNIA REPAIR Bilateral    LUMBAR LAMINECTOMY     TEE WITHOUT CARDIOVERSION N/A 08/14/2018   Procedure: TRANSESOPHAGEAL ECHOCARDIOGRAM (TEE);  Surgeon: Jeffrie Oneil BROCKS, MD;  Location: [facility] ENDOSCOPY;  Service: Cardiovascular;  Laterality: N/A;   TEE WITHOUT CARDIOVERSION N/A 03/30/2021   Procedure: TRANSESOPHAGEAL ECHOCARDIOGRAM (TEE);  Surgeon: Raford Riggs, MD;  Location: [facility] ENDOSCOPY;  Service: Cardiovascular;  Laterality: N/A;   UMBILICAL HERNIA REPAIR     Family History  Problem Relation Age of Onset  Stroke Father        Passed away in his 2s   Thyroid  disease Sister        goiter   Sudden death Brother        ? Drug use, no autopsy   Throat cancer Brother    Colon cancer Neg Hx    Esophageal cancer Neg Hx    Rectal cancer Neg Hx    Stomach cancer Neg Hx    Social History   Socioeconomic History   Marital status: Married    Spouse name: Not on file   Number of children: 2   Years of education: Not on file   Highest education level: Not on file  Occupational History   Occupation: airline pilot   Occupation: retired  Tobacco Use   Smoking status: Former    Current packs/day: 0.00    Average packs/day: 0.5 packs/day for 8.0 years (4.0 ttl pk-yrs)    Types: Cigarettes    Start date: 8    Quit date: 1968    Years since quitting: 57.8   Smokeless tobacco: Never   Tobacco comments:    Former smoker 08/22/22  Vaping Use   Vaping status: Never Used  Substance and Sexual Activity   Alcohol use: Yes     Alcohol/week: 1.0 - 2.0 standard drink of alcohol    Types: 1 - 2 Shots of liquor per week    Comment: socially   Drug use: No   Sexual activity: Yes  Other Topics Concern   Not on file  Social History Narrative   Not on file   Social Drivers of Health   Financial Resource Strain: Low Risk  (09/03/2024)   Overall Financial Resource Strain (CARDIA)    Difficulty of Paying Living Expenses: Not hard at all  Food Insecurity: No Food Insecurity (09/03/2024)   Hunger Vital Sign    Worried About Running Out of Food in the Last Year: Never true    Ran Out of Food in the Last Year: Never true  Transportation Needs: No Transportation Needs (09/03/2024)   PRAPARE - Administrator, Civil Service (Medical): No    Lack of Transportation (Non-Medical): No  Physical Activity: Insufficiently Active (09/03/2024)   Exercise Vital Sign    Days of Exercise per Week: 4 days    Minutes of Exercise per Session: 10 min  Stress: No Stress Concern Present (09/03/2024)   [employer] of Occupational Health - Occupational Stress Questionnaire    Feeling of Stress: Not at all  Social Connections: Moderately Isolated (09/03/2024)   Social Connection and Isolation Panel    Frequency of Communication with Friends and Family: More than three times a week    Frequency of Social Gatherings with Friends and Family: More than three times a week    Attends Religious Services: Never    Database Administrator or Organizations: No    Attends Banker Meetings: Never    Marital Status: Married    Tobacco Counseling Counseling given: Not Answered Tobacco comments: Former smoker 08/22/22    Clinical Intake:  Pre-visit preparation completed: Yes  Pain : 0-10 Pain Score: 7  Pain Type: Chronic pain Pain Location: Back Pain Orientation: Lower Pain Descriptors / Indicators: Aching Pain Onset: More than a month ago Pain Frequency: Constant     BMI - recorded: 32.61 Nutritional  Status: BMI > 30  Obese Nutritional Risks: None Diabetes: Yes CBG done?: No Did pt. bring in CBG monitor from  home?: No  Lab Results  Component Value Date   HGBA1C 5.6 09/02/2024   HGBA1C 5.7 01/29/2024   HGBA1C 5.6 07/03/2023     How often do you need to have someone help you when you read instructions, pamphlets, or other written materials from your doctor or pharmacy?: 1 - Never  Interpreter Needed?: No  Information entered by :: Ellouise Haws, LPN   Activities of Daily Living     09/03/2024    2:16 PM 09/10/2023    1:36 PM  In your present state of health, do you have any difficulty performing the following activities:  Hearing? 0 0  Vision? 0 0  Difficulty concentrating or making decisions? 0 0  Walking or climbing stairs? 0 0  Dressing or bathing? 0 0  Doing errands, shopping? 0 0  Preparing Food and eating ? N N  Using the Toilet? N N  In the past six months, have you accidently leaked urine? N N  Do you have problems with loss of bowel control? N N  Managing your Medications? N N  Managing your Finances? N N  Housekeeping or managing your Housekeeping? N N    Patient Care Team: Kennyth Worth HERO, MD as PCP - General (Family Medicine) Lonni Slain, MD as PCP - Cardiology (Cardiology) Inocencio Soyla Lunger, MD as PCP - Electrophysiology (Cardiology) Lanny Callander, MD as Consulting Physician (Hematology) [facility] as Consulting Physician (Ophthalmology)  I have updated your Care Teams any recent Medical Services you may have received from other providers in the past year.     Assessment:   This is a routine wellness examination for Gordon Phillips.  Hearing/Vision screen Hearing Screening - Comments:: Pt denies any hearing issues  Vision Screening - Comments:: Wears rx glasses - not up to date with routine eye exams with DR Octavia    Goals Addressed               This Visit's Progress     work on releasing pain in back (pt-stated)          Depression Screen     09/03/2024    2:22 PM 08/26/2024    2:26 PM 05/27/2024    2:02 PM 01/08/2024    1:55 PM 09/10/2023    1:40 PM 01/16/2023    2:17 PM 09/11/2022   12:55 PM  PHQ 2/9 Scores  PHQ - 2 Score 0 0 0 0 0 0 0    Fall Risk     09/03/2024    2:23 PM 05/27/2024    2:02 PM 01/08/2024    1:55 PM 09/10/2023    1:42 PM 01/16/2023    2:17 PM  Fall Risk   Falls in the past year? 0 0 0 0 0  Number falls in past yr: 0 0 0 0 0  Injury with Fall? 0 0 0 0 0  Risk for fall due to : No Fall Risks No Fall Risks No Fall Risks No Fall Risks No Fall Risks  Follow up Falls prevention discussed   Falls prevention discussed     MEDICARE RISK AT HOME:  Medicare Risk at Home Any stairs in or around the home?: Yes If so, are there any without handrails?: No Home free of loose throw rugs in walkways, pet beds, electrical cords, etc?: Yes Adequate lighting in your home to reduce risk of falls?: Yes Life alert?: No Use of a cane, walker or w/c?: No Grab bars in the bathroom?: No Shower  chair or bench in shower?: No Elevated toilet seat or a handicapped toilet?: No  TIMED UP AND GO:  Was the test performed?  No  Cognitive Function: 6CIT completed        09/03/2024    2:24 PM 09/10/2023    1:42 PM 08/21/2022    1:10 PM 08/08/2021   12:02 PM 07/29/2020   12:08 PM  6CIT Screen  What Year? 0 points 0 points 0 points 0 points 0 points  What month? 0 points 0 points 0 points 0 points 0 points  What time? 0 points 0 points 0 points 0 points   Count back from 20 0 points 0 points 0 points 0 points 0 points  Months in reverse 0 points 0 points 0 points 0 points 0 points  Repeat phrase 0 points 0 points 0 points 0 points 2 points  Total Score 0 points 0 points 0 points 0 points     Immunizations Immunization History  Administered Date(s) Administered   Fluad Quad(high Dose 65+) 08/26/2019, 09/07/2020, 09/13/2021, 09/11/2022   Fluad Trivalent(High Dose 65+) 01/08/2024   INFLUENZA,  HIGH DOSE SEASONAL PF 08/24/2017   Influenza Split 08/10/2011, 08/21/2012   Influenza,inj,Quad PF,6+ Mos 08/07/2013, 08/10/2014, 08/12/2018   Influenza-Unspecified 06/20/2016   PFIZER(Purple Top)SARS-COV-2 Vaccination 12/18/2019, 01/12/2020, 10/14/2020   Pfizer Covid-19 Vaccine Bivalent Booster 39yrs & up 11/21/2021   Pneumococcal Conjugate-13 08/10/2014   Pneumococcal Polysaccharide-23 03/20/2016   Tdap 12/14/2014   Zoster Recombinant(Shingrix) 08/13/2021, 11/21/2021    Screening Tests Health Maintenance  Topic Date Due   OPHTHALMOLOGY EXAM  09/11/2023   Influenza Vaccine  06/06/2024   COVID-19 Vaccine (5 - 2025-26 season) 07/07/2024   DTaP/Tdap/Td (2 - Td or Tdap) 12/14/2024   Diabetic kidney evaluation - Urine ACR  01/28/2025   FOOT EXAM  03/03/2025   HEMOGLOBIN A1C  03/03/2025   Diabetic kidney evaluation - eGFR measurement  08/19/2025   Medicare Annual Wellness (AWV)  09/03/2025   Pneumococcal Vaccine: 50+ Years  Completed   Hepatitis C Screening  Completed   Zoster Vaccines- Shingrix  Completed   Meningococcal B Vaccine  Aged Out   Colonoscopy  Discontinued    Health Maintenance Items Addressed: Vaccines Due: and discussed , See Nurse Notes at the end of this note  Additional Screening:  Vision Screening: Recommended annual ophthalmology exams for early detection of glaucoma and other disorders of the eye. Is the patient up to date with their annual eye exam?  No  Who is the provider or what is the name of the office in which the patient attends annual eye exams? Dr Octavia   Dental Screening: Recommended annual dental exams for proper oral hygiene  Community Resource Referral / Chronic Care Management: CRR required this visit?  No   CCM required this visit?  No   Plan:    I have personally reviewed and noted the following in the patient's chart:   Medical and social history Use of alcohol, tobacco or illicit drugs  Current medications and supplements  including opioid prescriptions. Patient is not currently taking opioid prescriptions. Functional ability and status Nutritional status Physical activity Advanced directives List of other physicians Hospitalizations, surgeries, and ER visits in previous 12 months Vitals Screenings to include cognitive, depression, and falls Referrals and appointments  In addition, I have reviewed and discussed with patient certain preventive protocols, quality metrics, and best practice recommendations. A written personalized care plan for preventive services as well as general preventive health recommendations were provided to  patient.   Ellouise VEAR Haws, LPN   [id number]   After Visit Summary: (MyChart) Due to this being a telephonic visit, the after visit summary with patients personalized plan was offered to patient via MyChart   Notes: Nothing significant to report at this time.

## 2024-09-03 NOTE — Patient Instructions (Signed)
 Mr. Gordon Phillips,  Thank you for taking the time for your Medicare Wellness Visit. I appreciate your continued commitment to your health goals. Please review the care plan we discussed, and feel free to reach out if I can assist you further.  Medicare recommends these wellness visits once per year to help you and your care team stay ahead of potential health issues. These visits are designed to focus on prevention, allowing your provider to concentrate on managing your acute and chronic conditions during your regular appointments.  Please note that Annual Wellness Visits do not include a physical exam. Some assessments may be limited, especially if the visit was conducted virtually. If needed, we may recommend a separate in-person follow-up with your provider.  Ongoing Care Seeing your primary care provider every 3 to 6 months helps us  monitor your health and provide consistent, personalized care.   Referrals If a referral was made during today's visit and you haven't received any updates within two weeks, please contact the referred provider directly to check on the status.  Recommended Screenings:  Health Maintenance  Topic Date Due   Eye exam for diabetics  09/11/2023   Flu Shot  06/06/2024   COVID-19 Vaccine (5 - 2025-26 season) 07/07/2024   Medicare Annual Wellness Visit  09/09/2024   DTaP/Tdap/Td vaccine (2 - Td or Tdap) 12/14/2024   Yearly kidney health urinalysis for diabetes  01/28/2025   Complete foot exam   03/03/2025   Hemoglobin A1C  03/03/2025   Yearly kidney function blood test for diabetes  08/19/2025   Pneumococcal Vaccine for age over 86  Completed   Hepatitis C Screening  Completed   Zoster (Shingles) Vaccine  Completed   Meningitis B Vaccine  Aged Out   Colon Cancer Screening  Discontinued       08/26/2024    2:45 PM  Advanced Directives  Does Patient Have a Medical Advance Directive? No  Would patient like information on creating a medical advance directive? No  - Patient declined   Advance Care Planning is important because it: Ensures you receive medical care that aligns with your values, goals, and preferences. Provides guidance to your family and loved ones, reducing the emotional burden of decision-making during critical moments.  Vision: Annual vision screenings are recommended for early detection of glaucoma, cataracts, and diabetic retinopathy. These exams can also reveal signs of chronic conditions such as diabetes and high blood pressure.  Dental: Annual dental screenings help detect early signs of oral cancer, gum disease, and other conditions linked to overall health, including heart disease and diabetes.  Please see the attached documents for additional preventive care recommendations.

## 2024-11-05 ENCOUNTER — Other Ambulatory Visit: Payer: Self-pay | Admitting: Family Medicine

## 2025-01-13 ENCOUNTER — Encounter: Admitting: Family Medicine

## 2025-03-03 ENCOUNTER — Ambulatory Visit: Admitting: Internal Medicine

## 2025-09-01 ENCOUNTER — Inpatient Hospital Stay

## 2025-09-15 ENCOUNTER — Ambulatory Visit
